# Patient Record
Sex: Female | Born: 1970 | Race: Black or African American | Hispanic: No | Marital: Married
Health system: Midwestern US, Community
[De-identification: ages and names within clinical notes are randomized; demographics above are authoritative.]

## PROBLEM LIST (undated history)

## (undated) DIAGNOSIS — N760 Acute vaginitis: Principal | ICD-10-CM

## (undated) DIAGNOSIS — N62 Hypertrophy of breast: Secondary | ICD-10-CM

## (undated) DIAGNOSIS — N390 Urinary tract infection, site not specified: Secondary | ICD-10-CM

## (undated) DIAGNOSIS — G932 Benign intracranial hypertension: Secondary | ICD-10-CM

## (undated) DIAGNOSIS — B9689 Other specified bacterial agents as the cause of diseases classified elsewhere: Secondary | ICD-10-CM

## (undated) DIAGNOSIS — F32A Depression, unspecified: Secondary | ICD-10-CM

## (undated) DIAGNOSIS — F419 Anxiety disorder, unspecified: Secondary | ICD-10-CM

## (undated) DIAGNOSIS — I10 Essential (primary) hypertension: Secondary | ICD-10-CM

## (undated) DIAGNOSIS — D734 Cyst of spleen: Secondary | ICD-10-CM

## (undated) DIAGNOSIS — H57052 Tonic pupil, left eye: Secondary | ICD-10-CM

## (undated) DIAGNOSIS — M359 Systemic involvement of connective tissue, unspecified: Secondary | ICD-10-CM

## (undated) DIAGNOSIS — F329 Major depressive disorder, single episode, unspecified: Secondary | ICD-10-CM

## (undated) DIAGNOSIS — G43909 Migraine, unspecified, not intractable, without status migrainosus: Secondary | ICD-10-CM

## (undated) DIAGNOSIS — M5412 Radiculopathy, cervical region: Secondary | ICD-10-CM

## (undated) DIAGNOSIS — Z87898 Personal history of other specified conditions: Secondary | ICD-10-CM

## (undated) DIAGNOSIS — M25511 Pain in right shoulder: Secondary | ICD-10-CM

## (undated) DIAGNOSIS — G8929 Other chronic pain: Secondary | ICD-10-CM

## (undated) DIAGNOSIS — Z1231 Encounter for screening mammogram for malignant neoplasm of breast: Secondary | ICD-10-CM

## (undated) DIAGNOSIS — R079 Chest pain, unspecified: Secondary | ICD-10-CM

## (undated) HISTORY — PX: WISDOM TOOTH EXTRACTION: SHX21

## (undated) HISTORY — PX: ENDOMETRIAL ABLATION: SHX621

## (undated) HISTORY — DX: Essential (primary) hypertension: I10

## (undated) HISTORY — DX: Tonic pupil, left eye: H57.052

## (undated) HISTORY — PX: APPENDECTOMY: SHX54

## (undated) HISTORY — PX: TUBAL LIGATION: SHX77

## (undated) HISTORY — PX: CHOLECYSTECTOMY: SHX55

---

## 2009-07-21 ENCOUNTER — Emergency Department (HOSPITAL_COMMUNITY): Admission: EM | Admit: 2009-07-21 | Discharge: 2009-07-21 | Payer: Self-pay | Admitting: Emergency Medicine

## 2010-05-26 LAB — COMPREHENSIVE METABOLIC PANEL
Albumin: 3.2 g/dL — ABNORMAL LOW (ref 3.5–5.2)
BUN: 16 mg/dL (ref 6–23)
CO2: 28 mEq/L (ref 19–32)
Calcium: 8.6 mg/dL (ref 8.4–10.5)
GFR calc non Af Amer: 58 mL/min — ABNORMAL LOW (ref >60)
Glucose, Bld: 98 mg/dL (ref 70–99)
Total Protein: 6.9 g/dL (ref 6.0–8.3)

## 2010-05-26 LAB — DIFFERENTIAL
Basophils Absolute: 0 10*3/uL (ref 0.0–0.1)
Eosinophils Absolute: 0 10*3/uL (ref 0.0–0.7)
Lymphocytes Relative: 14 % (ref 12–46)
Monocytes Absolute: 1.2 10*3/uL — ABNORMAL HIGH (ref 0.1–1.0)
Monocytes Relative: 12 % (ref 3–12)

## 2010-05-26 LAB — CBC
HCT: 38.2 % (ref 36.0–46.0)
MCV: 91 fL (ref 78.0–100.0)
Platelets: 307 10*3/uL (ref 150–400)
RBC: 4.2 MIL/uL (ref 3.87–5.11)
WBC: 10.2 10*3/uL (ref 4.0–10.5)

## 2010-05-26 LAB — URINALYSIS, ROUTINE W REFLEX MICROSCOPIC
Glucose, UA: NEGATIVE mg/dL
Hgb urine dipstick: NEGATIVE
Nitrite: NEGATIVE
Specific Gravity, Urine: 1.014 (ref 1.005–1.030)
pH: 6 (ref 5.0–8.0)

## 2010-06-18 ENCOUNTER — Other Ambulatory Visit (HOSPITAL_COMMUNITY)
Admission: RE | Admit: 2010-06-18 | Discharge: 2010-06-18 | Disposition: A | Discharge status: Home / Self Care | Payer: 59 | Source: Ambulatory Visit | Attending: Internal Medicine | Admitting: Internal Medicine

## 2010-06-18 DIAGNOSIS — Z1159 Encounter for screening for other viral diseases: Secondary | ICD-10-CM | POA: Insufficient documentation

## 2010-06-18 DIAGNOSIS — Z01419 Encounter for gynecological examination (general) (routine) without abnormal findings: Secondary | ICD-10-CM | POA: Insufficient documentation

## 2010-06-20 ENCOUNTER — Other Ambulatory Visit: Payer: Self-pay | Admitting: Internal Medicine

## 2010-06-20 DIAGNOSIS — Z803 Family history of malignant neoplasm of breast: Secondary | ICD-10-CM

## 2010-06-20 DIAGNOSIS — Z1231 Encounter for screening mammogram for malignant neoplasm of breast: Secondary | ICD-10-CM

## 2010-06-26 ENCOUNTER — Ambulatory Visit
Admission: RE | Admit: 2010-06-26 | Discharge: 2010-06-26 | Disposition: A | Discharge status: Home / Self Care | Payer: 59 | Source: Ambulatory Visit | Attending: Internal Medicine | Admitting: Internal Medicine

## 2010-06-26 DIAGNOSIS — Z1231 Encounter for screening mammogram for malignant neoplasm of breast: Secondary | ICD-10-CM

## 2010-06-26 DIAGNOSIS — Z803 Family history of malignant neoplasm of breast: Secondary | ICD-10-CM

## 2010-08-13 ENCOUNTER — Inpatient Hospital Stay (INDEPENDENT_AMBULATORY_CARE_PROVIDER_SITE_OTHER)
Admission: RE | Admit: 2010-08-13 | Discharge: 2010-08-13 | Disposition: A | Discharge status: Home / Self Care | Payer: 59 | Source: Ambulatory Visit | Attending: Family Medicine | Admitting: Family Medicine

## 2010-08-13 DIAGNOSIS — J029 Acute pharyngitis, unspecified: Secondary | ICD-10-CM

## 2010-08-13 DIAGNOSIS — H109 Unspecified conjunctivitis: Secondary | ICD-10-CM

## 2010-12-01 ENCOUNTER — Inpatient Hospital Stay (INDEPENDENT_AMBULATORY_CARE_PROVIDER_SITE_OTHER)
Admission: RE | Admit: 2010-12-01 | Discharge: 2010-12-01 | Disposition: A | Discharge status: Home / Self Care | Payer: 59 | Source: Ambulatory Visit | Attending: Family Medicine | Admitting: Family Medicine

## 2010-12-01 DIAGNOSIS — A499 Bacterial infection, unspecified: Secondary | ICD-10-CM

## 2010-12-01 DIAGNOSIS — B9689 Other specified bacterial agents as the cause of diseases classified elsewhere: Secondary | ICD-10-CM

## 2010-12-01 DIAGNOSIS — N760 Acute vaginitis: Secondary | ICD-10-CM

## 2010-12-01 LAB — POCT URINALYSIS DIP (DEVICE)
Bilirubin Urine: NEGATIVE
Ketones, ur: NEGATIVE mg/dL
Nitrite: NEGATIVE
Specific Gravity, Urine: 1.02 (ref 1.005–1.030)
Urobilinogen, UA: 0.2 mg/dL (ref 0.0–1.0)
pH: 5.5 (ref 5.0–8.0)

## 2010-12-01 LAB — WET PREP, GENITAL
Trich, Wet Prep: NONE SEEN
Yeast Wet Prep HPF POC: NONE SEEN

## 2011-01-10 ENCOUNTER — Inpatient Hospital Stay (INDEPENDENT_AMBULATORY_CARE_PROVIDER_SITE_OTHER)
Admission: RE | Admit: 2011-01-10 | Discharge: 2011-01-10 | Disposition: A | Discharge status: Home / Self Care | Payer: 59 | Source: Ambulatory Visit | Attending: Emergency Medicine | Admitting: Emergency Medicine

## 2011-01-10 DIAGNOSIS — H209 Unspecified iridocyclitis: Secondary | ICD-10-CM

## 2011-01-11 NOTE — Consult Note (Signed)
NAMEJAMES, LAFALCE              ACCOUNT NO.:  [id number]  MEDICAL RECORD NO.:  [id number]  LOCATION:  URG                          FACILITY:  [facility]  PHYSICIAN:  Chalmers Guest, M.D.     DATE OF BIRTH:  06-24-1970  DATE OF CONSULTATION:  01/10/2011 DATE OF DISCHARGE:  01/10/2011                                CONSULTATION   REASON FOR CONSULT:  I was requested to see the patient by [facility] physician, and the patient had presented to the urgent care complaining of severe pain, right eye.  The patient has a history of contact lens wear.  Stated she slept on her contact last night and also the urgent Care doctor noted decreased vision.  The patient's vision was tested in the right eye without contact lenses and without glasses. Vision in the right eye was count fingers at 6 feet.  The left eye revealed 20/30 with the contact lens in place.  The patient's pupils were normal reaction.  Motility was full.  On slit-lamp examination, there are slight staining on the cornea.  The conjunctiva had 2+ injection in the right eye.  The anterior chamber revealed trace to 1+ flare trace cell, lens clear, iris brown.  Funduscopic exam revealed the retina intact.  Optic nerve margins were sharp.  There was about 10% cupping in both eyes.  The left eye slit-lamp examination was completely normal.  ASSESSMENT: 1. Iritis. 2. Contact lens overwear.  RECOMMENDATION:  The patient will be started on TobraDex eyedrops 4 times a day to the right eye and atropine 1% twice a day.  She should follow up at my office in approximately 1 week.     Chalmers Guest, M.D.     RW/MEDQ  D:  01/10/2011  T:  01/11/2011  Job:  [id number]

## 2011-01-18 ENCOUNTER — Emergency Department (HOSPITAL_COMMUNITY)
Admission: EM | Admit: 2011-01-18 | Discharge: 2011-01-18 | Disposition: A | Discharge status: Home / Self Care | Payer: Self-pay | Source: Home / Self Care | Attending: Family Medicine | Admitting: Family Medicine

## 2011-01-18 ENCOUNTER — Encounter: Payer: Self-pay | Admitting: *Deleted

## 2011-01-18 DIAGNOSIS — L259 Unspecified contact dermatitis, unspecified cause: Secondary | ICD-10-CM

## 2011-01-18 DIAGNOSIS — B9689 Other specified bacterial agents as the cause of diseases classified elsewhere: Secondary | ICD-10-CM

## 2011-01-18 DIAGNOSIS — N760 Acute vaginitis: Secondary | ICD-10-CM

## 2011-01-18 DIAGNOSIS — A499 Bacterial infection, unspecified: Secondary | ICD-10-CM

## 2011-01-18 DIAGNOSIS — L509 Urticaria, unspecified: Secondary | ICD-10-CM

## 2011-01-18 DIAGNOSIS — L239 Allergic contact dermatitis, unspecified cause: Secondary | ICD-10-CM

## 2011-01-18 HISTORY — DX: Systemic involvement of connective tissue, unspecified: M35.9

## 2011-01-18 HISTORY — DX: Acute vaginitis: N76.0

## 2011-01-18 HISTORY — DX: Other specified bacterial agents as the cause of diseases classified elsewhere: B96.89

## 2011-01-18 LAB — POCT URINALYSIS DIP (DEVICE)
Ketones, ur: NEGATIVE mg/dL
Leukocytes, UA: NEGATIVE

## 2011-01-18 LAB — WET PREP, GENITAL: Yeast Wet Prep HPF POC: NONE SEEN

## 2011-01-18 MED ORDER — PREDNISONE (PAK) 10 MG PO TABS: ORAL_TABLET | ORAL | Status: AC

## 2011-01-18 MED ORDER — METRONIDAZOLE 500 MG PO TABS: 500.0000 mg | ORAL_TABLET | Freq: Two times a day (BID) | ORAL | Status: AC

## 2011-01-18 MED ORDER — FLUCONAZOLE 150 MG PO TABS: 150.0000 mg | ORAL_TABLET | Freq: Once | ORAL | Status: AC

## 2011-01-18 NOTE — ED Provider Notes (Signed)
History     CSN: [id number] Arrival date & time: 01/18/2011  2:44 PM   First MD Initiated Contact with Patient 01/18/11 1430      Chief Complaint  Patient presents with  . Rash    per pt allergic to menstrual pads wearing one due to vaginal discharge now with rash groin and thighs x 2 days - today rash arms /back - pt also with vaginal discharge   . Vaginal Discharge  . Allergic Reaction    (Consider location/radiation/quality/duration/timing/severity/associated sxs/prior treatment) HPI Comments: Margaret Fletcher presents for evaluation of itchy rash over her groin, axillae, forearms, and trunk. She thinks she might be allergic to menstrual pads or tampons she has been using. She reports a hx of hypersensitivity to various things. She reports autoimmune disease as well as alopecia.   Patient is a 40 y.o. female presenting with rash, vaginal discharge, and allergic reaction. The history is provided by the patient.  Rash  This is a new problem. The current episode started more than 2 days ago. The problem has not changed since onset.The problem is associated with an unknown factor. There has been no fever. The rash is present on the left arm, right arm, groin and abdomen. She has tried nothing for the symptoms. The treatment provided no relief.  Vaginal Discharge This is a new problem. The current episode started more than 2 days ago. The problem has not changed since onset.The symptoms are aggravated by nothing. The symptoms are relieved by nothing.  Allergic Reaction The primary symptoms are  rash and urticaria. The current episode started more than 2 days ago.    Past Medical History  Diagnosis Date  . Alopecia   . Autoimmune disease   . Bacterial vaginosis     Past Surgical History  Procedure Date  . Cesarean section     History reviewed. No pertinent family history.  History  Substance Use Topics  . Smoking status: Never Smoker   . Smokeless tobacco: Not on file  . Alcohol  Use: No    OB History    Grav Para Term Preterm Abortions TAB SAB Ect Mult Living                  Review of Systems  Constitutional: Negative.   HENT: Negative.   Eyes: Negative.   Respiratory: Negative.   Gastrointestinal: Negative.   Genitourinary: Positive for vaginal discharge. Negative for dysuria and pelvic pain.  Skin: Positive for rash.    Allergies  Sulfa antibiotics  Home Medications  No current outpatient prescriptions on file.  BP 136/88  Pulse 71  Temp(Src) 97.3 F (36.3 C) (Oral)  Resp 17  SpO2 99%  LMP 01/13/2011  Physical Exam  Constitutional: She appears well-developed and well-nourished.  HENT:  Head: Normocephalic and atraumatic.  Eyes: EOM are normal.  Genitourinary: Pelvic exam was performed with patient supine. There is no rash on the right labia. There is no rash on the left labia. Cervix exhibits no discharge. Vaginal discharge found.  Skin: Skin is warm and dry. Rash noted. Rash is macular and urticarial.       ED Course  Procedures (including critical care time)   Labs Reviewed  POCT URINALYSIS DIP (DEVICE)  POCT PREGNANCY, URINE  POCT URINALYSIS DIPSTICK  POCT PREGNANCY, URINE   No results found.   No diagnosis found.    MDM          Richardo Priest, MD 01/18/11 [phone]

## 2011-01-19 LAB — GC/CHLAMYDIA PROBE AMP, GENITAL: GC Probe Amp, Genital: NEGATIVE

## 2011-01-19 NOTE — ED Notes (Signed)
Labs and chart reviewed. Pt. adequately treated with Flagyl for bacterial vaginosis ( per protocol orders).   Marland Kitchen

## 2011-04-22 DIAGNOSIS — N762 Acute vulvitis: Secondary | ICD-10-CM | POA: Insufficient documentation

## 2011-06-26 ENCOUNTER — Emergency Department (HOSPITAL_COMMUNITY): Payer: 59

## 2011-06-26 ENCOUNTER — Emergency Department (HOSPITAL_COMMUNITY)
Admission: EM | Admit: 2011-06-26 | Discharge: 2011-06-26 | Disposition: A | Discharge status: Home / Self Care | Payer: 59 | Attending: Emergency Medicine | Admitting: Emergency Medicine

## 2011-06-26 ENCOUNTER — Encounter (HOSPITAL_COMMUNITY): Payer: Self-pay | Admitting: General Practice

## 2011-06-26 ENCOUNTER — Emergency Department (HOSPITAL_COMMUNITY)
Admission: EM | Admit: 2011-06-26 | Discharge: 2011-06-26 | Disposition: A | Discharge status: Home / Self Care | Payer: 59 | Source: Home / Self Care | Attending: Emergency Medicine | Admitting: Emergency Medicine

## 2011-06-26 ENCOUNTER — Encounter (HOSPITAL_COMMUNITY): Payer: Self-pay | Admitting: Emergency Medicine

## 2011-06-26 DIAGNOSIS — R51 Headache: Secondary | ICD-10-CM

## 2011-06-26 DIAGNOSIS — R209 Unspecified disturbances of skin sensation: Secondary | ICD-10-CM

## 2011-06-26 DIAGNOSIS — R202 Paresthesia of skin: Secondary | ICD-10-CM

## 2011-06-26 DIAGNOSIS — G43909 Migraine, unspecified, not intractable, without status migrainosus: Secondary | ICD-10-CM | POA: Insufficient documentation

## 2011-06-26 HISTORY — DX: Migraine, unspecified, not intractable, without status migrainosus: G43.909

## 2011-06-26 LAB — DIFFERENTIAL
Basophils Absolute: 0.1 10*3/uL (ref 0.0–0.1)
Eosinophils Absolute: 0.4 10*3/uL (ref 0.0–0.7)
Lymphs Abs: 2.4 10*3/uL (ref 0.7–4.0)
Neutrophils Relative %: 67 % (ref 43–77)

## 2011-06-26 LAB — URINALYSIS, ROUTINE W REFLEX MICROSCOPIC
Leukocytes, UA: NEGATIVE
Protein, ur: NEGATIVE mg/dL
Urobilinogen, UA: 0.2 mg/dL (ref 0.0–1.0)

## 2011-06-26 LAB — CBC
MCH: 28.7 pg (ref 26.0–34.0)
Platelets: 373 10*3/uL (ref 150–400)
RBC: 4.49 MIL/uL (ref 3.87–5.11)
RDW: 14.2 % (ref 11.5–15.5)
WBC: 10.7 10*3/uL — ABNORMAL HIGH (ref 4.0–10.5)

## 2011-06-26 LAB — BASIC METABOLIC PANEL
Calcium: 8.9 mg/dL (ref 8.4–10.5)
Creatinine, Ser: 1.14 mg/dL — ABNORMAL HIGH (ref 0.50–1.10)
GFR calc non Af Amer: 59 mL/min — ABNORMAL LOW (ref >90)
Glucose, Bld: 89 mg/dL (ref 70–99)
Sodium: 137 mEq/L (ref 135–145)

## 2011-06-26 LAB — POCT PREGNANCY, URINE: Preg Test, Ur: NEGATIVE

## 2011-06-26 MED ORDER — DIPHENHYDRAMINE HCL 50 MG/ML IJ SOLN
50.0000 mg | Freq: Once | INTRAMUSCULAR | Status: AC
  Administered 2011-06-26: 50 mg via INTRAVENOUS
  Filled 2011-06-26: qty 1

## 2011-06-26 MED ORDER — SODIUM CHLORIDE 0.9 % IV SOLN
INTRAVENOUS | Status: DC
  Administered 2011-06-26: 14:00:00 via INTRAVENOUS

## 2011-06-26 MED ORDER — METOCLOPRAMIDE HCL 5 MG/ML IJ SOLN
10.0000 mg | Freq: Once | INTRAMUSCULAR | Status: AC
  Administered 2011-06-26: 10 mg via INTRAVENOUS
  Filled 2011-06-26: qty 2

## 2011-06-26 NOTE — ED Notes (Signed)
PT HERE WITH C/O LEFT SIDE MIGRAINE H/A WITH LEFT ARM ACHY PAIN THAT STARTED @ 4AM.PT STATES SHE WAS SENT HOME LAST NIGHT FOR H.A AND STATES SHE WOKE UP WITH BILAT NUMBNESS IN THIGHS BUT HAS SUBSIDED.PT ALSO REPORTS OF METALLIC TASTE IN MOUTH Tuesday.DENIES N/V/ OR CP AT THIS TIME.PT WORRIED ABOUT SUDDEN SX.

## 2011-06-26 NOTE — ED Provider Notes (Deleted)
41yo F, c/o frequent episodes of alternating left and right frontal migraine headaches this past week. Also c/o "legs went numb" last night, as well as "heavy" left arm today when woke up.  Has long hx of migraines, but usually no assoc paresthesias.  Seen at [facility], sent to ED for further eval.   I saw this patient for screening purposes.  Pt is stable, but further workup and evaluation is needed beyond triage capabilities.   Laray Anger, DO 06/26/11 1351

## 2011-06-26 NOTE — Discharge Instructions (Signed)
RESOURCE GUIDE  Dental Problems  Patients with Medicaid: [facility]                     [facility] [address]                                           [address] Phone:  [phone]                                                  Phone:  [phone]  If unable to pay or uninsured, contact:  [facility] or [facility] to become qualified for the adult dental clinic.  Chronic Pain Problems Contact [facility]  [phone] Patients need to be referred by their primary care doctor.  Insufficient Money for Medicine Contact [employer]:  call "211" or [facility] 271-5999.  No Primary Care Doctor Call [facility]  832-8000 Other agencies that provide inexpensive medical care    [facility]  [phone]    [facility]  [phone]    [facility]  [phone]    Women's Clinic  [phone]    [facility]  [phone]    [facility]  [phone]  Psychological Services [facility]  [phone] [facility]  [phone] [facility]   [phone] (emergency services 641-4993)  Substance Abuse Resources [facility]  336-882-2125 [facility] 336-784-9470 [facility] 336-285-9073 [facility] 336-845-3988 Residential & Outpatient Substance Abuse Program  800-659-3381  Abuse/Neglect [place] Child Abuse Hotline (336) 641-3795 [place] Child Abuse Hotline 800-378-5315 (After Hours)  Emergency Shelter [facility] (336) 271-5985  Maternity Homes [facility] (336) 275-9566 [facility] (704) 372-4663  MRSA Hotline #:   832-7006    [place] [facility]  [facility] [place]     [employer]                          [facility] [address] [place]                       [address]      [address]  [place]                                                [place]                            [place] Phone:  [phone]                                   Phone:  [phone]                 Phone:  [phone]  [facility] Phone:  [phone]    [facility] Child Abuse Hotline 551-757-0256 267-237-6398 (After Hours)   Take your usual prescriptions as previously directed.  Call your regular medical doctor and the Neurologist on Monday to schedule a follow up appointment within the next week.  Return to the Emergency Department immediately sooner if worsening.

## 2011-06-26 NOTE — ED Provider Notes (Signed)
History     CSN: [id number]  Arrival date & time 06/26/11  1102   First MD Initiated Contact with Patient 06/26/11 1124      Chief Complaint  Patient presents with  . Migraine  . Arm Pain    (Consider location/radiation/quality/duration/timing/severity/associated sxs/prior treatment) HPI Comments: Patient started her shift last night around 7 PM and around 8 PM started developing a left frontal headache that she perceived to be a migraine. She had expressed a similar episode during the course of the week which had resolved with the use of Motrin. This particular event her headache this started around 8 PM was more severe towards the left side of her head and after taking several tablets of Motrin. Pains seem not to improve and she was sent home to rest. She woke up around 4 AM in the morning feeling that both of her thighs felt numb and " not normal feeling". She doesn't seem to be expressing a metallic taste in her mouth and as of 8 AM this morning started noticing the same abnormal sensation in her left upper arm but almost like a toothache.  Patient denies any fevers, visual changes, muscular weakness, or ambulation difficulties or disequilibrium.  Patient is a 41 y.o. female presenting with migraine and arm pain. The history is provided by the patient.  Migraine This is a new problem. The current episode started 12 to 24 hours ago. The problem occurs constantly. The problem has been gradually improving. Associated symptoms include headaches. Pertinent negatives include no chest pain, no abdominal pain and no shortness of breath. The symptoms are relieved by nothing. She has tried nothing for the symptoms.  Arm Pain Associated symptoms include headaches. Pertinent negatives include no chest pain, no abdominal pain and no shortness of breath.    Past Medical History  Diagnosis Date  . Alopecia   . Autoimmune disease   . Bacterial vaginosis     Past Surgical History  Procedure  Date  . Cesarean section     History reviewed. No pertinent family history.  History  Substance Use Topics  . Smoking status: Never Smoker   . Smokeless tobacco: Not on file  . Alcohol Use: No    OB History    Grav Para Term Preterm Abortions TAB SAB Ect Mult Living                  Review of Systems  Constitutional: Negative for activity change and appetite change.  Eyes: Negative for visual disturbance.  Respiratory: Negative for cough and shortness of breath.   Cardiovascular: Negative for chest pain.  Gastrointestinal: Negative for abdominal pain.  Musculoskeletal: Negative for back pain.  Neurological: Positive for dizziness, numbness and headaches. Negative for tremors, seizures, syncope, facial asymmetry and weakness.    Allergies  Sulfa antibiotics  Home Medications   Current Outpatient Rx  Name Route Sig Dispense Refill  . SUMATRIPTAN SUCCINATE 25 MG PO TABS Oral Take 25 mg by mouth every 2 (two) hours as needed.      BP 136/91  Pulse 68  Temp(Src) 97.5 F (36.4 C) (Oral)  Resp 14  SpO2 100%  LMP 05/25/2011  Physical Exam  Nursing note and vitals reviewed. Constitutional: She appears well-developed and well-nourished.  HENT:  Mouth/Throat: No oropharyngeal exudate.  Eyes: Right eye exhibits no discharge.  Neck: Neck supple.  Cardiovascular: Normal rate.   Musculoskeletal: Normal range of motion.  Neurological: She is alert. No cranial nerve deficit or sensory deficit. She  exhibits normal muscle tone. Coordination normal.       Visual confrontational testing was normal. Was unable to recreate any abnormal sensorial deficits. Muscle strength 5 out of 5 in both upper and lower extremities and normal hand grip  Skin: No rash noted.    ED Course  Procedures (including critical care time)  Labs Reviewed - No data to display No results found.   1. Headache   2. Paresthesias       MDM  Patient presents urgent care with constellation of  neurological symptoms that include an associated left sided headache with associated paresthesias to both lower extremities and left upper arm. Patient does have a history of migraine headaches although infrequent. (Patient has never experienced a headache with associated paresthesias in the past). Patient muscular sensorial information seem to be within normal when examined at [facility] around. Given neurological symptoms consider patient a candidate for further evaluation although suspicion for an acute cerebrovascular accident is low still needs to be in the differential diagnosis.        Jimmie Molly, MD 06/26/11 1243

## 2011-06-26 NOTE — ED Notes (Signed)
Pt states she began having a headache last night with heaviness in her legs. Upon awaking today she noted her headache pain was reduced but she was having numbness and tingling in her left arm and leg symptoms had subsided. She denies any unilateral weakness or visual disturbances. She went to urgent care today for eval and was sent to the ED.

## 2011-06-26 NOTE — ED Notes (Addendum)
Pt c/o dull headache and achy feeling to left arm. Equal grips and sensation, no pronator drift. Denies numbness or tingling. A&o x4. NAD noted with pt. Pt states that last night both legs felt numb but this subsided when she awoke this AM.

## 2011-06-26 NOTE — ED Provider Notes (Signed)
History     CSN: [id number]  Arrival date & time 06/26/11  1300   None     Chief Complaint  Patient presents with  . Migraine  . Numbness    left arm     HPI Pt was seen at 1345.   Per pt, c/o gradual onset and persistence of multiple intermittent episodes of acute flair of her chronic migraine headache for the past week. Describes the headache as per her usual chronic migraine headache pain pattern for many years.  Pt states the headache is located either on her left or right frontal areas.  Has been assoc with "both my entire thighs went numb" last night, as well as a "heavy feeling" in her left arm today that was present when she woke up this morning.  Pt endorses very long hx of migraine headaches, but usually does not have associated paresthesias.  Denies headache was sudden or maximal in onset or at any time.  Denies visual changes, no focal motor weakness, no fevers, no neck or back pain, no rash, no saddle anesthesia, no incont/retention of bowel or bladder.  Pt was eval at [facility], sent to the ED for further eval.     Past Medical History  Diagnosis Date  . Alopecia   . Autoimmune disease   . Bacterial vaginosis   . Migraine headache     Past Surgical History  Procedure Date  . Cesarean section   . Cholecystectomy   . Appendectomy   . Cesarean section      History  Substance Use Topics  . Smoking status: Never Smoker   . Smokeless tobacco: Not on file  . Alcohol Use: No    Review of Systems ROS: Statement: All systems negative except as marked or noted in the HPI; Constitutional: Negative for fever and chills. ; ; Eyes: Negative for eye pain, redness and discharge. ; ; ENMT: Negative for ear pain, hoarseness, nasal congestion, sinus pressure and sore throat. ; ; Cardiovascular: Negative for chest pain, palpitations, diaphoresis, dyspnea and peripheral edema. ; ; Respiratory: Negative for cough, wheezing and stridor. ; ; Gastrointestinal: Negative for nausea,  vomiting, diarrhea, abdominal pain, blood in stool, hematemesis, jaundice and rectal bleeding. . ; ; Genitourinary: Negative for dysuria, flank pain and hematuria. ; ; Musculoskeletal: Negative for back pain and neck pain. Negative for swelling and trauma.; ; Skin: Negative for pruritus, rash, abrasions, blisters, bruising and skin lesion.; ; Neuro: +headache, paresthesias.  Negative for lightheadedness and neck stiffness. Negative for weakness, altered level of consciousness , altered mental status, extremity weakness, involuntary movement, seizure and syncope.     Allergies  Sulfa antibiotics  Home Medications   Current Outpatient Rx  Name Route Sig Dispense Refill  . ACETAMINOPHEN 500 MG PO TABS Oral Take 1,000-1,500 mg by mouth every 6 (six) hours as needed. For headache pain    . IBUPROFEN 200 MG PO TABS Oral Take 400-1,500 mg by mouth every 6 (six) hours as needed. For headaches. 400mg  for dull pain and 1000mg  for severe pain    . SUMATRIPTAN SUCCINATE 25 MG PO TABS Oral Take 25 mg by mouth every 2 (two) hours as needed. For migraines      BP 137/85  Pulse 63  Temp(Src) 98.4 F (36.9 C) (Oral)  Resp 16  SpO2 98%  LMP 05/25/2011  Physical Exam 1350: Physical examination:  Nursing notes reviewed; Vital signs and O2 SAT reviewed;  Constitutional: Well developed, Well nourished, Well hydrated, In no  acute distress; Head:  Normocephalic, atraumatic; Eyes: EOMI, PERRL, No scleral icterus; ENMT: Mouth and pharynx normal, Mucous membranes moist; Neck: Supple, Full range of motion, No lymphadenopathy; Cardiovascular: Regular rate and rhythm, No murmur, rub, or gallop; Respiratory: Breath sounds clear & equal bilaterally, No rales, rhonchi, wheezes, or rub, Normal respiratory effort/excursion; Chest: Nontender, Movement normal; Abdomen: Soft, Nontender, Nondistended, Normal bowel sounds; Genitourinary: No CVA tenderness; Extremities: Pulses normal, No tenderness, No edema, No calf edema or  asymmetry.; Neuro: AA&Ox3, Major CN grossly intact. Speech clear, no facial droop, gait steady. No gross focal motor or sensory deficits in extremities.; Skin: Color normal, Warm, Dry, no rash.    ED Course  Procedures     MDM  MDM Reviewed: nursing note, vitals and previous chart Interpretation: labs, MRI and CT scan   Results for orders placed during the hospital encounter of 06/26/11  URINALYSIS, ROUTINE W REFLEX MICROSCOPIC      Component Value Range   Color, Urine YELLOW  YELLOW    APPearance CLEAR  CLEAR    Specific Gravity, Urine 1.027  1.005 - 1.030    pH 5.5  5.0 - 8.0    Glucose, UA NEGATIVE  NEGATIVE (mg/dL)   Hgb urine dipstick NEGATIVE  NEGATIVE    Bilirubin Urine NEGATIVE  NEGATIVE    Ketones, ur NEGATIVE  NEGATIVE (mg/dL)   Protein, ur NEGATIVE  NEGATIVE (mg/dL)   Urobilinogen, UA 0.2  0.0 - 1.0 (mg/dL)   Nitrite NEGATIVE  NEGATIVE    Leukocytes, UA NEGATIVE  NEGATIVE   BASIC METABOLIC PANEL      Component Value Range   Sodium 137  135 - 145 (mEq/L)   Potassium 4.3  3.5 - 5.1 (mEq/L)   Chloride 103  96 - 112 (mEq/L)   CO2 26  19 - 32 (mEq/L)   Glucose, Bld 89  70 - 99 (mg/dL)   BUN 16  6 - 23 (mg/dL)   Creatinine, Ser 1.61 (*) 0.50 - 1.10 (mg/dL)   Calcium 8.9  8.4 - 09.6 (mg/dL)   GFR calc non Af Amer 59 (*) >90 (mL/min)   GFR calc Af Amer 68 (*) >90 (mL/min)  CBC      Component Value Range   WBC 10.7 (*) 4.0 - 10.5 (K/uL)   RBC 4.49  3.87 - 5.11 (MIL/uL)   Hemoglobin 12.9  12.0 - 15.0 (g/dL)   HCT 04.5  40.9 - 81.1 (%)   MCV 86.6  78.0 - 100.0 (fL)   MCH 28.7  26.0 - 34.0 (pg)   MCHC 33.2  30.0 - 36.0 (g/dL)   RDW 91.4  78.2 - 95.6 (%)   Platelets 373  150 - 400 (K/uL)  DIFFERENTIAL      Component Value Range   Neutrophils Relative 67  43 - 77 (%)   Neutro Abs 7.2  1.7 - 7.7 (K/uL)   Lymphocytes Relative 22  12 - 46 (%)   Lymphs Abs 2.4  0.7 - 4.0 (K/uL)   Monocytes Relative 7  3 - 12 (%)   Monocytes Absolute 0.7  0.1 - 1.0 (K/uL)    Eosinophils Relative 3  0 - 5 (%)   Eosinophils Absolute 0.4  0.0 - 0.7 (K/uL)   Basophils Relative 1  0 - 1 (%)   Basophils Absolute 0.1  0.0 - 0.1 (K/uL)  POCT PREGNANCY, URINE      Component Value Range   Preg Test, Ur NEGATIVE  NEGATIVE    Ct Head Wo  Contrast 06/26/2011  *RADIOLOGY REPORT*  Clinical Data: Headache.  Leg numbness.  CT HEAD WITHOUT CONTRAST  Technique:  Contiguous axial images were obtained from the base of the skull through the vertex without contrast.  Comparison: None.  Findings: The brain has a normal appearance without evidence of atrophy, old or acute infarction, mass lesion, hemorrhage, hydrocephalus or extra-axial collection.  No calvarial abnormality. Visualized sinuses, middle ears and mastoids are clear.  IMPRESSION: Normal head CT.  No cause of headache identified.  Original Report Authenticated By: Thomasenia Sales, M.D.     6:52 PM:  MRI brain: prelim report with no acute stroke or visible bleed, empty sella.  Pt states she "feels better now" with all of her symptoms improved after meds.  States she wants to go home now.  Pt has had stable VS, neuro exam intact and unchanged, has been walking around the ED with steady gait, easy resps.  Appears to be migraine headache.  Has specific migraine headache meds at home, but states she "doesn't take them" because she "doesn't like the way on feel on them."  Pt also states her PMD wanted to start her on daily suppressive migraine headache meds, but pt states she refused because "they were seizure medicines and I didn't want to get a seizure if I stopped them."  Pt does not have hx of seizure; this explained to pt.  States she will f/u with her PMD this week.  Does not want any rx here for headache/pain, stating she will take OTC motrin and tylenol for headache.  Does not want rx anti-emetic or benadryl.  Dx testing d/w pt.  Questions answered.  Verb understanding, agreeable to d/c home with outpt f/u.             Laray Anger, DO 06/29/11 [phone]

## 2011-06-26 NOTE — ED Notes (Signed)
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

## 2011-10-21 ENCOUNTER — Other Ambulatory Visit: Payer: Self-pay | Admitting: Gastroenterology

## 2011-10-21 DIAGNOSIS — R109 Unspecified abdominal pain: Secondary | ICD-10-CM

## 2011-10-26 ENCOUNTER — Ambulatory Visit
Admission: RE | Admit: 2011-10-26 | Discharge: 2011-10-26 | Disposition: A | Discharge status: Home / Self Care | Payer: 59 | Source: Ambulatory Visit | Attending: Gastroenterology | Admitting: Gastroenterology

## 2011-10-26 DIAGNOSIS — R109 Unspecified abdominal pain: Secondary | ICD-10-CM

## 2011-10-26 MED ORDER — IOHEXOL 300 MG/ML  SOLN
125.0000 mL | Freq: Once | INTRAMUSCULAR | Status: AC | PRN
  Administered 2011-10-26: 125 mL via INTRAVENOUS

## 2011-11-10 ENCOUNTER — Encounter (HOSPITAL_COMMUNITY): Payer: Self-pay

## 2011-11-10 ENCOUNTER — Emergency Department (HOSPITAL_COMMUNITY)
Admission: EM | Admit: 2011-11-10 | Discharge: 2011-11-10 | Disposition: A | Discharge status: Home / Self Care | Payer: 59 | Source: Home / Self Care | Attending: Family Medicine | Admitting: Family Medicine

## 2011-11-10 DIAGNOSIS — A499 Bacterial infection, unspecified: Secondary | ICD-10-CM

## 2011-11-10 DIAGNOSIS — N760 Acute vaginitis: Secondary | ICD-10-CM

## 2011-11-10 DIAGNOSIS — B9689 Other specified bacterial agents as the cause of diseases classified elsewhere: Secondary | ICD-10-CM

## 2011-11-10 LAB — POCT URINALYSIS DIP (DEVICE)
Bilirubin Urine: NEGATIVE
Hgb urine dipstick: NEGATIVE
Ketones, ur: NEGATIVE mg/dL
Nitrite: NEGATIVE
Specific Gravity, Urine: 1.025 (ref 1.005–1.030)
pH: 6 (ref 5.0–8.0)

## 2011-11-10 LAB — WET PREP, GENITAL: Trich, Wet Prep: NONE SEEN

## 2011-11-10 MED ORDER — METRONIDAZOLE 250 MG PO TABS: 250.0000 mg | ORAL_TABLET | Freq: Three times a day (TID) | ORAL | Status: AC

## 2011-11-10 NOTE — ED Provider Notes (Signed)
History     CSN: [id number]  Arrival date & time 11/10/11  1732   First MD Initiated Contact with Patient 11/10/11 1734      Chief Complaint  Patient presents with  . Vaginal Discharge    5 day noted discharge of brownish in color, and has foul odor.  denies any urinary difficulty.  has not tried any otc meds.     (Consider location/radiation/quality/duration/timing/severity/associated sxs/prior treatment) Patient is a 41 y.o. female presenting with vaginal discharge. The history is provided by the patient.  Vaginal Discharge This is a new problem. The current episode started more than 2 days ago. The problem has not changed since onset.Pertinent negatives include no abdominal pain.    Past Medical History  Diagnosis Date  . Alopecia   . Autoimmune disease   . Bacterial vaginosis   . Migraine headache     Past Surgical History  Procedure Date  . Cesarean section   . Cholecystectomy   . Appendectomy   . Cesarean section     History reviewed. No pertinent family history.  History  Substance Use Topics  . Smoking status: Never Smoker   . Smokeless tobacco: Not on file  . Alcohol Use: No    OB History    Grav Para Term Preterm Abortions TAB SAB Ect Mult Living                  Review of Systems  Constitutional: Negative.   Gastrointestinal: Negative.  Negative for abdominal pain.  Genitourinary: Positive for vaginal discharge. Negative for vaginal bleeding and vaginal pain.    Allergies  Sulfa antibiotics  Home Medications   Current Outpatient Rx  Name Route Sig Dispense Refill  . ACETAMINOPHEN 500 MG PO TABS Oral Take 1,000-1,500 mg by mouth every 6 (six) hours as needed. For headache pain    . IBUPROFEN 200 MG PO TABS Oral Take 400-1,500 mg by mouth every 6 (six) hours as needed. For headaches. 400mg  for dull pain and 1000mg  for severe pain    . METRONIDAZOLE 250 MG PO TABS Oral Take 1 tablet (250 mg total) by mouth 3 (three) times daily. 21 tablet 0    . SUMATRIPTAN SUCCINATE 25 MG PO TABS Oral Take 25 mg by mouth every 2 (two) hours as needed. For migraines      BP 127/86  Pulse 85  Temp 99.1 F (37.3 C) (Oral)  Resp 18  SpO2 99%  LMP 11/04/2011  Physical Exam  Nursing note and vitals reviewed. Constitutional: She appears well-developed and well-nourished.  Abdominal: Soft. Bowel sounds are normal.  Genitourinary: Uterus normal. Uterus is not tender. Cervix exhibits no motion tenderness and no discharge. Right adnexum displays no mass and no tenderness. Left adnexum displays no mass and no tenderness. No tenderness or bleeding around the vagina. No foreign body around the vagina. No signs of injury around the vagina. Vaginal discharge found.  Lymphadenopathy:       Right: No inguinal adenopathy present.       Left: No inguinal adenopathy present.    ED Course  Procedures (including critical care time)  Labs Reviewed  POCT URINALYSIS DIP (DEVICE) - Abnormal; Notable for the following:    Leukocytes, UA TRACE (*)  Biochemical Testing Only. Please order routine urinalysis from main lab if confirmatory testing is needed.   All other components within normal limits  GC/CHLAMYDIA PROBE AMP, GENITAL  WET PREP, GENITAL   No results found.   1. Bacterial vaginitis  MDM          Linna Hoff, MD 11/10/11 [phone]

## 2011-11-10 NOTE — ED Notes (Signed)
5 day noted discharge of brownish in color, and has foul odor.  denies any urinary difficulty.  has not tried any otc meds

## 2011-11-11 LAB — GC/CHLAMYDIA PROBE AMP, GENITAL: GC Probe Amp, Genital: NEGATIVE

## 2011-11-12 NOTE — ED Notes (Signed)
GC/Chlamydia neg., Wet prep: Clue cells TNTC, mod. WBC's. Pt. adequately treated with Flagyl. Margaret Fletcher 11/12/2011

## 2012-03-23 ENCOUNTER — Encounter (HOSPITAL_COMMUNITY): Payer: Self-pay

## 2012-03-23 ENCOUNTER — Other Ambulatory Visit (HOSPITAL_COMMUNITY)
Admission: RE | Admit: 2012-03-23 | Discharge: 2012-03-23 | Disposition: A | Discharge status: Home / Self Care | Payer: 59 | Source: Ambulatory Visit | Attending: Emergency Medicine | Admitting: Emergency Medicine

## 2012-03-23 ENCOUNTER — Emergency Department (HOSPITAL_COMMUNITY)
Admission: EM | Admit: 2012-03-23 | Discharge: 2012-03-23 | Disposition: A | Discharge status: Home / Self Care | Payer: 59 | Source: Home / Self Care | Attending: Emergency Medicine | Admitting: Emergency Medicine

## 2012-03-23 DIAGNOSIS — Z113 Encounter for screening for infections with a predominantly sexual mode of transmission: Secondary | ICD-10-CM | POA: Insufficient documentation

## 2012-03-23 DIAGNOSIS — N760 Acute vaginitis: Secondary | ICD-10-CM

## 2012-03-23 LAB — POCT PREGNANCY, URINE: Preg Test, Ur: NEGATIVE

## 2012-03-23 MED ORDER — METRONIDAZOLE 500 MG PO TABS: 500.0000 mg | ORAL_TABLET | Freq: Two times a day (BID) | ORAL | Status: DC

## 2012-03-23 NOTE — ED Provider Notes (Signed)
History     CSN: [id number]  Arrival date & time 03/23/12  1615   First MD Initiated Contact with Patient 03/23/12 1631      No chief complaint on file.   (Consider location/radiation/quality/duration/timing/severity/associated sxs/prior treatment) HPI Comments: Patient presents urgent care describing she is expressing menstrual collapse in that she tends to have bacterial vaginosis prior and after each of her periods. She  describes she has no concerns of having been exposed an SUV and is refusing to have a pelvic exam. She has a followup appointment with her GYN Dr.   Donn Pierini history is provided by the patient.    Past Medical History  Diagnosis Date  . Alopecia   . Autoimmune disease   . Bacterial vaginosis   . Migraine headache     Past Surgical History  Procedure Date  . Cesarean section   . Cholecystectomy   . Appendectomy   . Cesarean section     No family history on file.  History  Substance Use Topics  . Smoking status: Never Smoker   . Smokeless tobacco: Not on file  . Alcohol Use: No    OB History    Grav Para Term Preterm Abortions TAB SAB Ect Mult Living                  Review of Systems  Constitutional: Negative for fever, chills, diaphoresis, activity change, appetite change and fatigue.  Respiratory: Negative for shortness of breath.   Gastrointestinal: Negative for nausea and diarrhea.  Genitourinary: Positive for vaginal discharge. Negative for urgency, frequency, hematuria, flank pain, genital sores, menstrual problem and pelvic pain.  Skin: Negative for color change, rash and wound.    Allergies  Sulfa antibiotics  Home Medications   Current Outpatient Rx  Name  Route  Sig  Dispense  Refill  . ACETAMINOPHEN 500 MG PO TABS   Oral   Take 1,000-1,500 mg by mouth every 6 (six) hours as needed. For headache pain         . IBUPROFEN 200 MG PO TABS   Oral   Take 400-1,500 mg by mouth every 6 (six) hours as needed. For headaches. 400mg   for dull pain and 1000mg  for severe pain         . METRONIDAZOLE 500 MG PO TABS   Oral   Take 1 tablet (500 mg total) by mouth 2 (two) times daily.   14 tablet   0   . SUMATRIPTAN SUCCINATE 25 MG PO TABS   Oral   Take 25 mg by mouth every 2 (two) hours as needed. For migraines           BP 123/88  Pulse 64  Temp 98.4 F (36.9 C) (Oral)  Resp 18  SpO2 100%  Physical Exam  Constitutional: Vital signs are normal. She appears well-developed and well-nourished.  Non-toxic appearance. She does not have a sickly appearance. She does not appear ill. No distress.  HENT:  Head: Normocephalic.  Neck: Neck supple.  Neurological: She is alert.  Skin: No rash noted. No erythema.    ED Course  Procedures (including critical care time)   Labs Reviewed  URINE CYTOLOGY ANCILLARY ONLY   No results found.   1. Vaginitis       MDM  Vaginal discharge with menstrual pain. Patient to continue taking ibuprofen, to take Flagyl empirically we have obtained urine sample which were ruling out other potential sources .   Jimmie Molly, MD 03/23/12 1739

## 2012-03-23 NOTE — ED Notes (Signed)
Reportedly has BV w each menses, and has again

## 2012-04-17 ENCOUNTER — Encounter (HOSPITAL_COMMUNITY): Payer: Self-pay | Admitting: *Deleted

## 2012-04-17 ENCOUNTER — Emergency Department (HOSPITAL_COMMUNITY)
Admission: EM | Admit: 2012-04-17 | Discharge: 2012-04-17 | Disposition: A | Discharge status: Home / Self Care | Payer: 59 | Source: Home / Self Care

## 2012-04-17 DIAGNOSIS — L0390 Cellulitis, unspecified: Secondary | ICD-10-CM

## 2012-04-17 DIAGNOSIS — L0291 Cutaneous abscess, unspecified: Secondary | ICD-10-CM

## 2012-04-17 MED ORDER — DOXYCYCLINE HYCLATE 50 MG PO CAPS: 50.0000 mg | ORAL_CAPSULE | Freq: Two times a day (BID) | ORAL | Status: DC

## 2012-04-17 MED ORDER — ONDANSETRON HCL 4 MG PO TABS: 4.0000 mg | ORAL_TABLET | Freq: Three times a day (TID) | ORAL | Status: DC | PRN

## 2012-04-17 MED ORDER — FLUCONAZOLE 100 MG PO TABS: 100.0000 mg | ORAL_TABLET | Freq: Every day | ORAL | Status: DC

## 2012-04-17 NOTE — ED Provider Notes (Signed)
History     CSN: [id number]  Arrival date & time 04/17/12  1141   First MD Initiated Contact with Patient 04/17/12 1143      Chief Complaint  Patient presents with  . Recurrent Skin Infections    (Consider location/radiation/quality/duration/timing/severity/associated sxs/prior treatment) HPI  Past Medical History  Diagnosis Date  . Alopecia   . Autoimmune disease   . Bacterial vaginosis   . Migraine headache     Past Surgical History  Procedure Laterality Date  . Cesarean section    . Cholecystectomy    . Appendectomy    . Cesarean section      No family history on file.  History  Substance Use Topics  . Smoking status: Never Smoker   . Smokeless tobacco: Not on file  . Alcohol Use: No    OB History   Grav Para Term Preterm Abortions TAB SAB Ect Mult Living                  Review of Systems  Allergies  Sulfa antibiotics  Home Medications   Current Outpatient Rx  Name  Route  Sig  Dispense  Refill  . acetaminophen (TYLENOL) 500 MG tablet   Oral   Take 1,000-1,500 mg by mouth every 6 (six) hours as needed. For headache pain         . ibuprofen (ADVIL,MOTRIN) 200 MG tablet   Oral   Take 400-1,500 mg by mouth every 6 (six) hours as needed. For headaches. 400mg  for dull pain and 1000mg  for severe pain         . metroNIDAZOLE (FLAGYL) 500 MG tablet   Oral   Take 1 tablet (500 mg total) by mouth 2 (two) times daily.   14 tablet   0   . SUMAtriptan (IMITREX) 25 MG tablet   Oral   Take 25 mg by mouth every 2 (two) hours as needed. For migraines           Pulse 66  Temp(Src) 98.3 F (36.8 C) (Oral)  Resp 17  SpO2 100%  LMP 03/28/2012  Physical Exam  ED Course  Procedures Incision and Drainage Procedure Note  Pre-operative Diagnosis: Abscess  Post-operative Diagnosis: same  Indications: Pain, swelling, induration  Anesthesia: 1% lidocaine with epinephrine  Procedure Details  The procedure, risks and complications have been  discussed in detail (including, but not limited to airway compromise, infection, bleeding) with the patient, and the patient has signed consent to the procedure.  The skin was sterilely prepped and draped over the affected area in the usual fashion. After adequate local anesthesia, I&D with a #11 blade was performed on the right, super pubic area Purulent drainage: absent The patient was observed until stable.  Findings: Indurated abscess   EBL: 0.5 cc's  Drains: None  Condition: Tolerated procedure well   Complications: none.     Labs Reviewed - No data to display No results found.   No diagnosis found.    MDM  Abscess, drained        Rodolph Bong, MD 04/20/12 [phone]

## 2012-04-17 NOTE — ED Notes (Signed)
Patient states she had a boil on her abdomen x 3 days. Right inguinal area of abdomen with fever and chills.. Patient denies nausea, vomiting.

## 2012-04-17 NOTE — ED Provider Notes (Signed)
History     CSN: [id number]  Arrival date & time 04/17/12  1141   First MD Initiated Contact with Patient 04/17/12 1143      Chief Complaint  Patient presents with  . Recurrent Skin Infections    (Consider location/radiation/quality/duration/timing/severity/associated sxs/prior treatment) HPI This is a 42 year old female who presents with a "boil" near her groin for the past 3 days. She tried to squeeze it but nothing has come out and it has grown. She has not had any fevers or chills. She had a similar boil on her abdomen a while back (she cannot remember) and was able to express it bleeding it to resolve without any medical treatment.  She also states that when she receives antibiotics she develops a vaginal yeast infections and is asking for Diflucan. Past Medical History  Diagnosis Date  . Alopecia   . Autoimmune disease   . Bacterial vaginosis   . Migraine headache     Past Surgical History  Procedure Laterality Date  . Cesarean section    . Cholecystectomy    . Appendectomy    . Cesarean section      No family history on file.  History  Substance Use Topics  . Smoking status: Never Smoker   . Smokeless tobacco: Not on file  . Alcohol Use: No    OB History   Grav Para Term Preterm Abortions TAB SAB Ect Mult Living                  Review of Systems  Constitutional: Negative for chills, diaphoresis and fatigue.  HENT: Negative.   Eyes: Negative.   Respiratory: Negative.   Cardiovascular: Negative.   Gastrointestinal: Negative.   Genitourinary: Negative.   Skin: Positive for rash.  Neurological: Negative.   Psychiatric/Behavioral: Negative.     Allergies  Sulfa antibiotics  Home Medications   Current Outpatient Rx  Name  Route  Sig  Dispense  Refill  . acetaminophen (TYLENOL) 500 MG tablet   Oral   Take 1,000-1,500 mg by mouth every 6 (six) hours as needed. For headache pain         . ibuprofen (ADVIL,MOTRIN) 200 MG tablet   Oral   Take  400-1,500 mg by mouth every 6 (six) hours as needed. For headaches. 400mg  for dull pain and 1000mg  for severe pain         . metroNIDAZOLE (FLAGYL) 500 MG tablet   Oral   Take 1 tablet (500 mg total) by mouth 2 (two) times daily.   14 tablet   0   . SUMAtriptan (IMITREX) 25 MG tablet   Oral   Take 25 mg by mouth every 2 (two) hours as needed. For migraines           Pulse 66  Temp(Src) 98.3 F (36.8 C) (Oral)  Resp 17  SpO2 100%  LMP 03/28/2012  Physical Exam  Constitutional: She is oriented to person, place, and time. She appears well-developed and well-nourished.  HENT:  Head: Normocephalic and atraumatic.  Cardiovascular: Normal rate and regular rhythm.   Pulmonary/Chest: Effort normal and breath sounds normal.  Abdominal: Soft. Bowel sounds are normal.  Neurological: She is alert and oriented to person, place, and time.  Skin:  And lower abdomen there is a area which is 2 inches in diameter, erythematous, indurated and tender.    ED Course  Procedures (including critical care time)  Labs Reviewed - No data to display No results found.   Cellulitis  MDM  I&D performed by Dr. Denyse Amass. Doxycycline prescribed along with Diflucan if the vaginal candidiasis occurs and Zofran for nausea. She is strongly advised that if fevers develop or if the rash worsens over the next 24-48 hours she needs to go to the ER.      Calvert Cantor, MD 04/17/12 [phone]

## 2012-04-17 NOTE — ED Notes (Signed)
Placed sterile 2x2 dressing on drainage site with paper tape. Instructed patient to keep dressing on today and tomorrow and change tomorrow morning.

## 2012-04-28 ENCOUNTER — Other Ambulatory Visit: Payer: Self-pay | Admitting: Obstetrics and Gynecology

## 2012-04-28 DIAGNOSIS — N6311 Unspecified lump in the right breast, upper outer quadrant: Secondary | ICD-10-CM

## 2012-05-10 ENCOUNTER — Other Ambulatory Visit: Payer: 59

## 2012-05-16 ENCOUNTER — Other Ambulatory Visit: Payer: 59

## 2012-07-04 ENCOUNTER — Encounter (HOSPITAL_COMMUNITY): Payer: Self-pay | Admitting: Obstetrics and Gynecology

## 2012-07-08 ENCOUNTER — Encounter (HOSPITAL_COMMUNITY): Payer: Self-pay | Admitting: Pharmacist

## 2012-07-19 ENCOUNTER — Ambulatory Visit (HOSPITAL_COMMUNITY): Payer: 59 | Admitting: Anesthesiology

## 2012-07-19 ENCOUNTER — Encounter (HOSPITAL_COMMUNITY)
Admission: RE | Disposition: A | Discharge status: Home / Self Care | Payer: Self-pay | Source: Ambulatory Visit | Attending: Obstetrics and Gynecology

## 2012-07-19 ENCOUNTER — Encounter (HOSPITAL_COMMUNITY): Payer: Self-pay | Admitting: Anesthesiology

## 2012-07-19 ENCOUNTER — Ambulatory Visit (HOSPITAL_COMMUNITY)
Admission: RE | Admit: 2012-07-19 | Discharge: 2012-07-19 | Disposition: A | Discharge status: Home / Self Care | Payer: 59 | Source: Ambulatory Visit | Attending: Obstetrics and Gynecology | Admitting: Obstetrics and Gynecology

## 2012-07-19 DIAGNOSIS — N920 Excessive and frequent menstruation with regular cycle: Secondary | ICD-10-CM | POA: Insufficient documentation

## 2012-07-19 HISTORY — PX: DILITATION & CURRETTAGE/HYSTROSCOPY WITH NOVASURE ABLATION: SHX5568

## 2012-07-19 LAB — CBC
MCH: 27.8 pg (ref 26.0–34.0)
MCV: 85 fL (ref 78.0–100.0)
Platelets: 357 10*3/uL (ref 150–400)
RBC: 4.21 MIL/uL (ref 3.87–5.11)
RDW: 14.9 % (ref 11.5–15.5)

## 2012-07-19 SURGERY — DILATATION & CURETTAGE/HYSTEROSCOPY WITH NOVASURE ABLATION
Anesthesia: General | Site: Vagina | Wound class: Clean Contaminated

## 2012-07-19 MED ORDER — ONDANSETRON HCL 4 MG/2ML IJ SOLN
INTRAMUSCULAR | Status: DC | PRN
  Administered 2012-07-19: 4 mg via INTRAVENOUS

## 2012-07-19 MED ORDER — LIDOCAINE HCL 1 % IJ SOLN
INTRAMUSCULAR | Status: DC | PRN
  Administered 2012-07-19: 10 mL

## 2012-07-19 MED ORDER — FENTANYL CITRATE 0.05 MG/ML IJ SOLN
INTRAMUSCULAR | Status: AC
  Administered 2012-07-19: 50 ug via INTRAVENOUS
  Filled 2012-07-19: qty 2

## 2012-07-19 MED ORDER — ONDANSETRON HCL 4 MG/2ML IJ SOLN
4.0000 mg | Freq: Once | INTRAMUSCULAR | Status: AC | PRN
  Administered 2012-07-19: 4 mg via INTRAVENOUS

## 2012-07-19 MED ORDER — OXYCODONE-ACETAMINOPHEN 5-325 MG PO TABS: 1.0000 | ORAL_TABLET | ORAL | Status: DC | PRN

## 2012-07-19 MED ORDER — ONDANSETRON HCL 4 MG/2ML IJ SOLN
INTRAMUSCULAR | Status: AC
  Filled 2012-07-19: qty 2

## 2012-07-19 MED ORDER — LIDOCAINE HCL (CARDIAC) 20 MG/ML IV SOLN
INTRAVENOUS | Status: DC | PRN
  Administered 2012-07-19: 100 mg via INTRAVENOUS

## 2012-07-19 MED ORDER — FENTANYL CITRATE 0.05 MG/ML IJ SOLN
INTRAMUSCULAR | Status: AC
  Filled 2012-07-19: qty 2

## 2012-07-19 MED ORDER — ALUM & MAG HYDROXIDE-SIMETH 200-200-20 MG/5ML PO SUSP
30.0000 mL | ORAL | Status: AC | PRN
  Administered 2012-07-19: 30 mL via ORAL
  Filled 2012-07-19 (×2): qty 30

## 2012-07-19 MED ORDER — ALUM & MAG HYDROXIDE-SIMETH 200-200-20 MG/5ML PO SUSP
30.0000 mL | ORAL | Status: DC | PRN
  Filled 2012-07-19: qty 30

## 2012-07-19 MED ORDER — DEXAMETHASONE SODIUM PHOSPHATE 10 MG/ML IJ SOLN
INTRAMUSCULAR | Status: AC
  Filled 2012-07-19: qty 1

## 2012-07-19 MED ORDER — PROPOFOL 10 MG/ML IV EMUL
INTRAVENOUS | Status: DC | PRN
  Administered 2012-07-19: 200 mg via INTRAVENOUS

## 2012-07-19 MED ORDER — KETOROLAC TROMETHAMINE 30 MG/ML IJ SOLN: 15.0000 mg | Freq: Once | INTRAMUSCULAR | Status: DC | PRN

## 2012-07-19 MED ORDER — DEXAMETHASONE SODIUM PHOSPHATE 4 MG/ML IJ SOLN
INTRAMUSCULAR | Status: DC | PRN
  Administered 2012-07-19: 10 mg via INTRAVENOUS

## 2012-07-19 MED ORDER — LIDOCAINE HCL (CARDIAC) 20 MG/ML IV SOLN
INTRAVENOUS | Status: AC
  Filled 2012-07-19: qty 5

## 2012-07-19 MED ORDER — MIDAZOLAM HCL 2 MG/2ML IJ SOLN
INTRAMUSCULAR | Status: AC
  Filled 2012-07-19: qty 2

## 2012-07-19 MED ORDER — MIDAZOLAM HCL 5 MG/5ML IJ SOLN
INTRAMUSCULAR | Status: DC | PRN
  Administered 2012-07-19: 2 mg via INTRAVENOUS

## 2012-07-19 MED ORDER — MEPERIDINE HCL 25 MG/ML IJ SOLN
INTRAMUSCULAR | Status: AC
  Administered 2012-07-19: 12.5 mg via INTRAVENOUS
  Filled 2012-07-19: qty 1

## 2012-07-19 MED ORDER — LACTATED RINGERS IV SOLN
INTRAVENOUS | Status: DC
  Administered 2012-07-19 (×2): via INTRAVENOUS

## 2012-07-19 MED ORDER — KETOROLAC TROMETHAMINE 30 MG/ML IJ SOLN
INTRAMUSCULAR | Status: AC
  Filled 2012-07-19: qty 1

## 2012-07-19 MED ORDER — KETOROLAC TROMETHAMINE 30 MG/ML IJ SOLN
INTRAMUSCULAR | Status: DC | PRN
  Administered 2012-07-19: 30 mg via INTRAVENOUS

## 2012-07-19 MED ORDER — MEPERIDINE HCL 25 MG/ML IJ SOLN
6.2500 mg | INTRAMUSCULAR | Status: DC | PRN
  Administered 2012-07-19: 12.5 mg via INTRAVENOUS

## 2012-07-19 MED ORDER — OXYCODONE-ACETAMINOPHEN 5-325 MG PO TABS
1.0000 | ORAL_TABLET | ORAL | Status: DC | PRN
  Administered 2012-07-19: 1 via ORAL

## 2012-07-19 MED ORDER — FENTANYL CITRATE 0.05 MG/ML IJ SOLN
25.0000 ug | INTRAMUSCULAR | Status: DC | PRN
  Administered 2012-07-19 (×4): 50 ug via INTRAVENOUS

## 2012-07-19 MED ORDER — PROPOFOL 10 MG/ML IV EMUL
INTRAVENOUS | Status: AC
  Filled 2012-07-19: qty 20

## 2012-07-19 MED ORDER — FENTANYL CITRATE 0.05 MG/ML IJ SOLN
INTRAMUSCULAR | Status: DC | PRN
  Administered 2012-07-19: 100 ug via INTRAVENOUS

## 2012-07-19 MED ORDER — LACTATED RINGERS IR SOLN
Status: DC | PRN
  Administered 2012-07-19: 3000 mL

## 2012-07-19 MED ORDER — OXYCODONE-ACETAMINOPHEN 5-325 MG PO TABS
ORAL_TABLET | ORAL | Status: AC
  Filled 2012-07-19: qty 1

## 2012-07-19 SURGICAL SUPPLY — 15 items
ABLATOR ENDOMETRIAL BIPOLAR (ABLATOR) ×2 IMPLANT
CANISTER SUCTION 2500CC (MISCELLANEOUS) ×2 IMPLANT
CATH ROBINSON RED A/P 16FR (CATHETERS) ×2 IMPLANT
CLOTH BEACON ORANGE TIMEOUT ST (SAFETY) ×2 IMPLANT
CONTAINER PREFILL 10% NBF 60ML (FORM) ×2 IMPLANT
DILATOR CANAL MILEX (MISCELLANEOUS) IMPLANT
DRESSING TELFA 8X3 (GAUZE/BANDAGES/DRESSINGS) ×2 IMPLANT
ELECT REM PT RETURN 9FT ADLT (ELECTROSURGICAL)
ELECTRODE REM PT RTRN 9FT ADLT (ELECTROSURGICAL) IMPLANT
GLOVE BIO SURGEON STRL SZ7 (GLOVE) ×2 IMPLANT
GOWN STRL REIN XL XLG (GOWN DISPOSABLE) ×6 IMPLANT
PACK HYSTEROSCOPY LF (CUSTOM PROCEDURE TRAY) ×2 IMPLANT
PAD OB MATERNITY 4.3X12.25 (PERSONAL CARE ITEMS) ×2 IMPLANT
TOWEL OR 17X24 6PK STRL BLUE (TOWEL DISPOSABLE) ×4 IMPLANT
WATER STERILE IRR 1000ML POUR (IV SOLUTION) ×2 IMPLANT

## 2012-07-19 NOTE — Transfer of Care (Signed)
Immediate Anesthesia Transfer of Care Note  Patient: Margaret Fletcher  Procedure(s) Performed: Procedure(s): HYSTEROSCOPY WITH NOVASURE ABLATION (N/A)  Patient Location: PACU  Anesthesia Type:General  Level of Consciousness: awake, alert  and oriented  Airway & Oxygen Therapy: Patient Spontanous Breathing and Patient connected to nasal cannula oxygen  Post-op Assessment: Report given to PACU RN and Post -op Vital signs reviewed and stable  Post vital signs: Reviewed and stable  Complications: No apparent anesthesia complications

## 2012-07-19 NOTE — Op Note (Signed)
Pre-Operative Diagnosis: 1) Menorrhagia Postoperative diagnoses: 1) Menorrhagia Procedure: Hysteroscopy, Dilation & Curettage, Endometrial Ablation with Novasure Surgeon: Dr. Waynard Reeds Asst.: None Operative findings: Uterus in mid position.  Uterus sounded to 9.5 cm with cervical length, Cavity Width 4.0, Cavity Length 5.5cm. Power 88. Ablation time 2 minutes Specimen: Endometrial curettings EBL: Minimal Procedure: 42 year old [place] American female presents for surgical evaluation of menorrhagia. She has had heavy menstrual bleeding for several years. She declines medical intervention and wishes to proceed with surgical management with endometrial ablation procedure. The risks, benefits, and alternatives of the procedure were discussed at length with the patient and she wishes to proceed. Following the appropriate informed consent the patient was taken to the operating room and placed in the dorsal lithotomy position. General anesthesia was administered. The patient was appropriately identified during a pre-procedure timeout. The patient was prepped and draped in the normal sterile fashion. A speculum was placed in the vagina a single-tooth tenaculum was placed on the anterior lip of the cervix. A paracervical block was administered with 10 cc of 1% lidocaine without epinephrine. The cervix was serially dilated with Hank dilators. The uterine cavity sounded to 9.5 cm. The hysteroscope was then introduced and a survey of the intrauterine cavity was performed. Bilateral tubal ostia were visualized. The endometrium was noted to be thin. The hysteroscope was removed and curettage was performed with resultant endometrial curettings. At this point the NovaSure ray was introduced transcervically and deployed. A big cavity width was measured at 4.0 cm a cavity assessment was performed which passed. The NovaSure ablation procedure was initiated for a total of 2 minutes. Following completion of the ablation the  NovaSure device was removed and the hysteroscope was reintroduced. A survey of the intrauterine cavity demonstrated an adequate ablation of bilateral cornua as well as the remainder of the intrauterine cavity. This completed the surgical procedure. The sponge lap and needle counts were correct x2. The patient was taken to the postanesthesia care unit in good condition after the procedure.

## 2012-07-19 NOTE — Anesthesia Procedure Notes (Signed)
Procedure Name: LMA Insertion Date/Time: 07/19/2012 12:07 PM Performed by: Benjamin Casanas, Jannet Askew Pre-anesthesia Checklist: Patient identified, Patient being monitored, Emergency Drugs available, Timeout performed and Suction available Patient Re-evaluated:Patient Re-evaluated prior to inductionOxygen Delivery Method: Circle system utilized Preoxygenation: Pre-oxygenation with 100% oxygen Intubation Type: IV induction LMA: LMA inserted LMA Size: 4.0 Number of attempts: 1 Dental Injury: Teeth and Oropharynx as per pre-operative assessment

## 2012-07-19 NOTE — Anesthesia Preprocedure Evaluation (Signed)
Anesthesia Evaluation  Patient identified by MRN, date of birth, ID band Patient awake    Reviewed: Allergy & Precautions, H&P , NPO status , Patient's Chart, lab work & pertinent test results  Airway Mallampati: II TM Distance: >3 FB Neck ROM: full    Dental no notable dental hx. (+) Teeth Intact   Pulmonary neg pulmonary ROS,    Pulmonary exam normal       Cardiovascular negative cardio ROS      Neuro/Psych negative psych ROS   GI/Hepatic negative GI ROS, Neg liver ROS,   Endo/Other  Morbid obesity  Renal/GU negative Renal ROS  negative genitourinary   Musculoskeletal negative musculoskeletal ROS (+)   Abdominal Normal abdominal exam  (+) + obese,   Peds negative pediatric ROS (+)  Hematology negative hematology ROS (+)   Anesthesia Other Findings   Reproductive/Obstetrics negative OB ROS                           Anesthesia Physical Anesthesia Plan  ASA: III  Anesthesia Plan: General   Post-op Pain Management:    Induction: Intravenous  Airway Management Planned: LMA  Additional Equipment:   Intra-op Plan:   Post-operative Plan:   Informed Consent: I have reviewed the patients History and Physical, chart, labs and discussed the procedure including the risks, benefits and alternatives for the proposed anesthesia with the patient or authorized representative who has indicated his/her understanding and acceptance.     Plan Discussed with: CRNA and Surgeon  Anesthesia Plan Comments:         Anesthesia Quick Evaluation

## 2012-07-19 NOTE — H&P (Signed)
Margaret Fletcher is an 42 y.o. female presents for novasure endometrial ablation due to menorrhagia  42 yo AA female presents for surgical management of menorrhagia.  She has had heavy menstrual cycles over he last several years.  She declines medical intervention and wishes to proceed with surgical management with endometrial ablation.  R/B/A of the procedure were discussed at length with the patient.  US showed uterus to be 9.3 x 5.4 x 4.9 with one right lateral fibroid  Measuring 1.86cm [name] was 06/24/2012.    Past Medical History  Diagnosis Date  . Alopecia   . Autoimmune disease   . Bacterial vaginosis   . Migraine headache     Past Surgical History  Procedure Laterality Date  . Cesarean section    . Cholecystectomy    . Appendectomy    . Cesarean section    . Wisdom tooth extraction    . Tubal ligation      No family history on file.  Social History:  reports that she has never smoked. She has never used smokeless tobacco. She reports that  drinks alcohol. She reports that she does not use illicit drugs.  Allergies:  Allergies  Allergen Reactions  . Sulfa Antibiotics Anaphylaxis    Prescriptions prior to admission  Medication Sig Dispense Refill  . fluconazole (DIFLUCAN) 150 MG tablet Take 150 mg by mouth as needed (for yeast infections).      Marland Kitchen ibuprofen (ADVIL,MOTRIN) 800 MG tablet Take 800 mg by mouth every 8 (eight) hours as needed for pain.      . Multiple Vitamins-Minerals (MULTIVITAMIN WITH MINERALS) tablet Take 1 tablet by mouth daily.      . Probiotic Product (PROBIOTIC DAILY PO) Take 1 tablet by mouth daily.      . SUMAtriptan (IMITREX) 25 MG tablet Take 25 mg by mouth every 2 (two) hours as needed. For migraines        ROS  Height 5\' 8"  (1.727 m), weight 136.079 kg (300 lb), last menstrual period 06/24/2012. Physical Exam  Results for orders placed during the hospital encounter of 07/19/12 (from the past 24 hour(s))  CBC      Status: Abnormal   Collection Time    07/19/12  9:19 AM      Result Value Range   WBC 9.2  4.0 - 10.5 K/uL   RBC 4.21  3.87 - 5.11 MIL/uL   Hemoglobin 11.7 (*) 12.0 - 15.0 g/dL   HCT 45.4 (*) 09.8 - 11.9 %   MCV 85.0  78.0 - 100.0 fL   MCH 27.8  26.0 - 34.0 pg   MCHC 32.7  30.0 - 36.0 g/dL   RDW 14.7  82.9 - 56.2 %   Platelets 357  150 - 400 K/uL    No results found.  Assessment/Plan: 1) Proceed with Novasure endometrial ablation and d& c  Margaret Fletcher H. 07/19/2012, 11:31 AM

## 2012-07-19 NOTE — Anesthesia Postprocedure Evaluation (Signed)
  Anesthesia Post-op Note  Patient: Margaret Fletcher  Procedure(s) Performed: Procedure(s): HYSTEROSCOPY WITH NOVASURE ABLATION (N/A)  Patient Location: PACU  Anesthesia Type:General  Level of Consciousness: awake, alert  and oriented  Airway and Oxygen Therapy: Patient Spontanous Breathing  Post-op Pain: mild  Post-op Assessment: Post-op Vital signs reviewed, Patient's Cardiovascular Status Stable, Respiratory Function Stable, Patent Airway, No signs of Nausea or vomiting and Pain level controlled. Patient had some chest pain radiating to back in PACU. 12 lead EKG normal. Given Mylanta po with subsequent resolution of pain. Suspect this was related to GER or esophageal spasm.  Post-op Vital Signs: Reviewed and stable  Complications: No apparent anesthesia complications

## 2012-07-20 ENCOUNTER — Encounter (HOSPITAL_COMMUNITY): Payer: Self-pay | Admitting: Obstetrics and Gynecology

## 2012-08-12 ENCOUNTER — Other Ambulatory Visit (HOSPITAL_COMMUNITY)
Admission: RE | Admit: 2012-08-12 | Discharge: 2012-08-12 | Disposition: A | Discharge status: Home / Self Care | Payer: 59 | Source: Ambulatory Visit | Attending: Emergency Medicine | Admitting: Emergency Medicine

## 2012-08-12 ENCOUNTER — Encounter (HOSPITAL_COMMUNITY): Payer: Self-pay | Admitting: *Deleted

## 2012-08-12 ENCOUNTER — Emergency Department (HOSPITAL_COMMUNITY)
Admission: EM | Admit: 2012-08-12 | Discharge: 2012-08-12 | Disposition: A | Discharge status: Home / Self Care | Payer: 59 | Source: Home / Self Care

## 2012-08-12 DIAGNOSIS — N898 Other specified noninflammatory disorders of vagina: Secondary | ICD-10-CM

## 2012-08-12 DIAGNOSIS — A499 Bacterial infection, unspecified: Secondary | ICD-10-CM

## 2012-08-12 DIAGNOSIS — R350 Frequency of micturition: Secondary | ICD-10-CM

## 2012-08-12 DIAGNOSIS — N3289 Other specified disorders of bladder: Secondary | ICD-10-CM

## 2012-08-12 DIAGNOSIS — N760 Acute vaginitis: Secondary | ICD-10-CM

## 2012-08-12 DIAGNOSIS — Z113 Encounter for screening for infections with a predominantly sexual mode of transmission: Secondary | ICD-10-CM | POA: Insufficient documentation

## 2012-08-12 DIAGNOSIS — B9689 Other specified bacterial agents as the cause of diseases classified elsewhere: Secondary | ICD-10-CM

## 2012-08-12 LAB — POCT URINALYSIS DIP (DEVICE)
Bilirubin Urine: NEGATIVE
Ketones, ur: NEGATIVE mg/dL
Specific Gravity, Urine: >=1.03 (ref 1.005–1.030)
pH: 5.5 (ref 5.0–8.0)

## 2012-08-12 MED ORDER — CEPHALEXIN 500 MG PO CAPS: 500.0000 mg | ORAL_CAPSULE | Freq: Four times a day (QID) | ORAL | Status: DC

## 2012-08-12 MED ORDER — METRONIDAZOLE 500 MG PO TABS: 500.0000 mg | ORAL_TABLET | Freq: Two times a day (BID) | ORAL | Status: DC

## 2012-08-12 MED ORDER — FLUCONAZOLE 150 MG PO TABS: ORAL_TABLET | ORAL | Status: DC

## 2012-08-12 NOTE — ED Provider Notes (Signed)
Medical screening examination/treatment/procedure(s) were performed by non-physician practitioner and as supervising physician I was immediately available for consultation/collaboration.  Stuart Guillen   Vong Garringer, MD 08/12/12 1227 

## 2012-08-12 NOTE — ED Notes (Signed)
Pt reports 4-5 days of urinary frequency and pelvic pressure.  She denies fever.   She had a uterine ablation 07/19/2012 and has had some vaginal discharge since then, but she seems like it may have gotten worse the past 2 days

## 2012-08-12 NOTE — ED Provider Notes (Signed)
And a History     CSN: [id number]  Arrival date & time 08/12/12  1011   None     Chief Complaint  Patient presents with  . Urinary Tract Infection    (Consider location/radiation/quality/duration/timing/severity/associated sxs/prior treatment) HPI Comments: This is a 42 year old morbidly obese female is approximately 3 weeks status post a uterine ablation. Since that procedure she has had scant vaginal discharge but feels that the discharge has increased in volume in the last 2-3 days. She is also complaining of urinary frequency low abnormal/pelvic pressure upon urination and pain in the lower most back. The symptoms began approximately 4-5 days ago. She denies fever, chills, neck or upper abdominal pain, nausea, vomiting or other constitutional symptoms.   Past Medical History  Diagnosis Date  . Alopecia   . Autoimmune disease   . Bacterial vaginosis   . Migraine headache     Past Surgical History  Procedure Laterality Date  . Cesarean section    . Cholecystectomy    . Appendectomy    . Cesarean section    . Wisdom tooth extraction    . Tubal ligation    . Dilitation & currettage/hystroscopy with novasure ablation N/A 07/19/2012    Procedure: HYSTEROSCOPY WITH NOVASURE ABLATION;  Surgeon: Freddrick March. Tenny Craw, MD;  Location: [facility];  Service: Gynecology;  Laterality: N/A;    No family history on file.  History  Substance Use Topics  . Smoking status: Never Smoker   . Smokeless tobacco: Never Used  . Alcohol Use: Yes     Comment: socially    OB History   Grav Para Term Preterm Abortions TAB SAB Ect Mult Living                  Review of Systems  Constitutional: Negative.   Respiratory: Negative.   Cardiovascular: Negative.   Gastrointestinal: Negative.   Genitourinary: Positive for urgency, frequency, vaginal discharge and pelvic pain. Negative for dysuria, hematuria and flank pain.  Musculoskeletal: Negative.   Neurological: Negative.   Psychiatric/Behavioral:  Negative.     Allergies  Sulfa antibiotics  Home Medications   Current Outpatient Rx  Name  Route  Sig  Dispense  Refill  . ibuprofen (ADVIL,MOTRIN) 800 MG tablet   Oral   Take 800 mg by mouth every 8 (eight) hours as needed for pain.         . Multiple Vitamins-Minerals (MULTIVITAMIN WITH MINERALS) tablet   Oral   Take 1 tablet by mouth daily.         . Probiotic Product (PROBIOTIC DAILY PO)   Oral   Take 1 tablet by mouth daily.         . cephALEXin (KEFLEX) 500 MG capsule   Oral   Take 1 capsule (500 mg total) by mouth 4 (four) times daily.   28 capsule   0   . fluconazole (DIFLUCAN) 150 MG tablet   Oral   Take 150 mg by mouth as needed (for yeast infections).         . fluconazole (DIFLUCAN) 150 MG tablet      1 tab po x 1. May repeat in 72 hours if no improvement   2 tablet   0   . metroNIDAZOLE (FLAGYL) 500 MG tablet   Oral   Take 1 tablet (500 mg total) by mouth 2 (two) times daily. X 7 days   14 tablet   0   . SUMAtriptan (IMITREX) 25 MG tablet   Oral  Take 25 mg by mouth every 2 (two) hours as needed. For migraines           BP 120/74  Pulse 64  Temp(Src) 97.9 F (36.6 C) (Oral)  Resp 16  SpO2 100%  LMP 07/07/2012  Physical Exam  Nursing note and vitals reviewed. Constitutional: She is oriented to person, place, and time. She appears well-developed and well-nourished. No distress.  Eyes: EOM are normal.  Neck: Normal range of motion. Neck supple.  Cardiovascular: Normal rate.   Pulmonary/Chest: Effort normal. No respiratory distress.  Abdominal: Soft. She exhibits no distension and no mass. There is no tenderness. There is no rebound and no guarding.  Genitourinary:  Normal external female genitalia. Upon admission of the speculum redundant vaginal walls block most of the view of the cervix. There is creamy gray vaginal discharge along the vaginal walls including the cervix. Only a portion of the cervix is visible. No lesions or  bleeding. No CMT with manipulation of the speculum or large Q-tip.  Musculoskeletal: She exhibits no edema and no tenderness.  Neurological: She is alert and oriented to person, place, and time. She exhibits normal muscle tone.  Skin: Skin is warm and dry.  Psychiatric: She has a normal mood and affect.    ED Course  Procedures (including critical care time)  Labs Reviewed  POCT URINALYSIS DIP (DEVICE) - Abnormal; Notable for the following:    Hgb urine dipstick TRACE (*)    Protein, ur 30 (*)    All other components within normal limits  URINE CULTURE  CERVICOVAGINAL ANCILLARY ONLY   No results found.   1. Urinary frequency   2. Bladder spasms   3. Vaginal discharge   4. BV (bacterial vaginosis)       MDM  Patient is treated. Leave for what looks to be BV. Flagyl 500 mg twice a day for 7 days. Only the urinalysis does not reflect a UTI at this point her symptomatology suggests that she is having one. Treat with Keflex 500 mg 4 times a day for 7 days. Drink plenty of fluids stay well hydrated. Swabs were obtained for testing for the Affirm and DNA testing. Patient is discharged in stable condition. Results for orders placed during the hospital encounter of 08/12/12  POCT URINALYSIS DIP (DEVICE)      Result Value Range   Glucose, UA NEGATIVE  NEGATIVE mg/dL   Bilirubin Urine NEGATIVE  NEGATIVE   Ketones, ur NEGATIVE  NEGATIVE mg/dL   Specific Gravity, Urine >=1.030  1.005 - 1.030   Hgb urine dipstick TRACE (*) NEGATIVE   pH 5.5  5.0 - 8.0   Protein, ur 30 (*) NEGATIVE mg/dL   Urobilinogen, UA 0.2  0.0 - 1.0 mg/dL   Nitrite NEGATIVE  NEGATIVE   Leukocytes, UA NEGATIVE  NEGATIVE     Hayden Rasmussen, NP 08/12/12 1137

## 2012-08-13 LAB — URINE CULTURE
Colony Count: NO GROWTH
Culture: NO GROWTH

## 2012-08-14 NOTE — ED Notes (Signed)
Chart review.

## 2012-08-16 NOTE — ED Notes (Signed)
GC/Chlamydia neg., Affirm: Candida and Gardnerella pos., Trich neg., Pt. adequately treated with Diflucan and Flagyl. Vassie Moselle 08/16/2012

## 2012-12-28 ENCOUNTER — Encounter (HOSPITAL_COMMUNITY): Payer: Self-pay | Admitting: Emergency Medicine

## 2012-12-28 ENCOUNTER — Emergency Department (HOSPITAL_COMMUNITY)
Admission: EM | Admit: 2012-12-28 | Discharge: 2012-12-28 | Disposition: A | Discharge status: Home / Self Care | Payer: 59 | Source: Home / Self Care | Attending: Family Medicine | Admitting: Family Medicine

## 2012-12-28 DIAGNOSIS — B3731 Acute candidiasis of vulva and vagina: Secondary | ICD-10-CM

## 2012-12-28 DIAGNOSIS — B373 Candidiasis of vulva and vagina: Secondary | ICD-10-CM

## 2012-12-28 MED ORDER — FLUCONAZOLE 150 MG PO TABS: 150.0000 mg | ORAL_TABLET | Freq: Once | ORAL | Status: DC

## 2012-12-28 NOTE — ED Provider Notes (Signed)
Margaret Fletcher is a 42 y.o. female who presents to Urgent Care today for vaginal itching. Patient recently finished Keflex for her urinary tract infection. She notes gradual onset of itching after finishing antibiotics. Her symptoms have worsened recently. She notes that the symptoms are consistent with prior episodes of yeast infection. She denies any vaginal discharge or STD exposure. She has had multiple yeast infections in the past and this is very consistent. She has a followup appointment with her primary care provider in about one week and would like to avoid a gynecologic exam of possible.    Past Medical History  Diagnosis Date  . Alopecia   . Autoimmune disease   . Bacterial vaginosis   . Migraine headache    History  Substance Use Topics  . Smoking status: Never Smoker   . Smokeless tobacco: Never Used  . Alcohol Use: Yes     Comment: socially   ROS as above Medications reviewed. No current facility-administered medications for this encounter.   Current Outpatient Prescriptions  Medication Sig Dispense Refill  . fluconazole (DIFLUCAN) 150 MG tablet Take 1 tablet (150 mg total) by mouth once.  1 tablet  1  . ibuprofen (ADVIL,MOTRIN) 800 MG tablet Take 800 mg by mouth every 8 (eight) hours as needed for pain.      . Multiple Vitamins-Minerals (MULTIVITAMIN WITH MINERALS) tablet Take 1 tablet by mouth daily.      . Probiotic Product (PROBIOTIC DAILY PO) Take 1 tablet by mouth daily.      . SUMAtriptan (IMITREX) 25 MG tablet Take 25 mg by mouth every 2 (two) hours as needed. For migraines        Exam:  BP 93/67  Pulse 74  Temp(Src) 98.1 F (36.7 C) (Oral)  Resp 20  SpO2 99% Gen: Well NAD HEENT: EOMI,  MMM Lungs: CTABL Nl WOB Heart: RRR no MRG Abd: NABS, NT, ND Exts: Non edematous BL  LE, warm and well perfused.   No results found for this or any previous visit (from the past 24 hour(s)). No results found.  Assessment and Plan: 42 y.o. female with yeast  vaginitis. Symptoms are consistent with yeast vaginitis. I feel is reasonable to treat empirically with fluconazole. If not better follow up with primary care provider or back careful gynecologic exam with testing. Discussed warning signs or symptoms. Please see discharge instructions. Patient expresses understanding.      Rodolph Bong, MD 12/28/12 1009

## 2012-12-28 NOTE — ED Notes (Signed)
States she completed antibiotics about 2 weeks ago , and since then has had a gradual development of vaginal irritation associated w a yeast infection that she reportedly has w each antibiotic; denies STD concerns

## 2013-03-20 ENCOUNTER — Encounter: Payer: 59 | Attending: Internal Medicine | Admitting: *Deleted

## 2013-03-20 ENCOUNTER — Encounter: Payer: Self-pay | Admitting: *Deleted

## 2013-03-20 DIAGNOSIS — E7800 Pure hypercholesterolemia, unspecified: Secondary | ICD-10-CM | POA: Insufficient documentation

## 2013-03-20 DIAGNOSIS — Z713 Dietary counseling and surveillance: Secondary | ICD-10-CM | POA: Insufficient documentation

## 2013-03-20 DIAGNOSIS — E669 Obesity, unspecified: Secondary | ICD-10-CM | POA: Insufficient documentation

## 2013-03-20 DIAGNOSIS — Z6841 Body Mass Index (BMI) 40.0 and over, adult: Secondary | ICD-10-CM | POA: Insufficient documentation

## 2013-03-20 NOTE — Progress Notes (Signed)
Medical Nutrition Therapy:  Appt start time: 0800 end time:  0900.  Assessment:  Patient here today for obesity and elevated cholesterol. She reports that she has eaten the same way her whole life, but since finding out she has high cholesterol and possibly pre-diabetes, she wants to make changes to improve her health. She works at [employer] afrom 7p-7a 3 days weekly. 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 She reports snacking throughout night at work, usually on unhealthy foods. She also drinks a lot of sweetened drinks. Portion control is an issue for her. Patient doesn't currently exercise, but has been looking into yoga, pilates, and spin classes.   MEDICATIONS: Reviewed   DIETARY INTAKE:   Usual eating pattern includes 3 meals and 1-2 snacks per day.  24-hr recall:  B ( AM): Sometimes, Corn flakes, banana, sugar, 2% milk OR 3 scrambled eggs, 3 slices toast, 8 oz orange juice  Snk ( AM): None  L ( PM): Fast food (burger, fries, sweet tea) Snk ( PM): None D ( PM): 8 Chicken wings, salad with bleu cheese 1 packet, Mountain Dew Snk (4 AM): Vending machine, mozzarella sticks, Peanut butter and crackers, sandwich Beverages: Orange juice, Mt. Dew, sweet tea, water  Usual physical activity: None  Estimated energy needs: 1500 calories 188 g carbohydrates 94 g protein 42 g fat  Progress Towards Goal(s):  In progress.   Nutritional Diagnosis:  [place]-3.3 Overweight/obesity As related to excessive energy intake and physical inactivity.  As evidenced by BMI 46.7.    Intervention:  Nutrition counseling. Patient educated on general healthful eating, including the importance of regular balanced meals, healthy snacking, and portion control.   Goals:  1. 0.5-1 pound weight loss per week.  2. Balance nutrients at meals using the plate method. 3. Monitor portion size of foods.  4. Bring healthy snacks to work (yogurt,  fruit, peanut butter and crackers, moderate portions of nuts). Limits snacks to no more than 300 calories per day.  5. Limit sweetened drinks to 1 [facility] on work days. Consider cutting back more.  6. Exercise at least 30 minutes 3 days weekly.   Handouts given during visit include:  Weight loss tips  Meal plan card  Monitoring/Evaluation:  Dietary intake, exercise, and body weight in 2 month(s).

## 2013-03-21 ENCOUNTER — Emergency Department (HOSPITAL_COMMUNITY)
Admission: EM | Admit: 2013-03-21 | Discharge: 2013-03-21 | Disposition: A | Discharge status: Home / Self Care | Payer: 59 | Attending: Emergency Medicine | Admitting: Emergency Medicine

## 2013-03-21 ENCOUNTER — Emergency Department (HOSPITAL_COMMUNITY): Payer: 59

## 2013-03-21 ENCOUNTER — Encounter (HOSPITAL_COMMUNITY): Payer: Self-pay | Admitting: Radiology

## 2013-03-21 DIAGNOSIS — Z872 Personal history of diseases of the skin and subcutaneous tissue: Secondary | ICD-10-CM | POA: Insufficient documentation

## 2013-03-21 DIAGNOSIS — R109 Unspecified abdominal pain: Secondary | ICD-10-CM | POA: Diagnosis present

## 2013-03-21 DIAGNOSIS — Z8742 Personal history of other diseases of the female genital tract: Secondary | ICD-10-CM | POA: Insufficient documentation

## 2013-03-21 DIAGNOSIS — D7389 Other diseases of spleen: Secondary | ICD-10-CM | POA: Insufficient documentation

## 2013-03-21 DIAGNOSIS — Z862 Personal history of diseases of the blood and blood-forming organs and certain disorders involving the immune mechanism: Secondary | ICD-10-CM | POA: Insufficient documentation

## 2013-03-21 DIAGNOSIS — R110 Nausea: Secondary | ICD-10-CM | POA: Insufficient documentation

## 2013-03-21 DIAGNOSIS — Z8639 Personal history of other endocrine, nutritional and metabolic disease: Secondary | ICD-10-CM | POA: Insufficient documentation

## 2013-03-21 DIAGNOSIS — D734 Cyst of spleen: Secondary | ICD-10-CM | POA: Diagnosis present

## 2013-03-21 DIAGNOSIS — K7689 Other specified diseases of liver: Secondary | ICD-10-CM | POA: Insufficient documentation

## 2013-03-21 DIAGNOSIS — K769 Liver disease, unspecified: Secondary | ICD-10-CM | POA: Diagnosis present

## 2013-03-21 DIAGNOSIS — Z79899 Other long term (current) drug therapy: Secondary | ICD-10-CM | POA: Insufficient documentation

## 2013-03-21 DIAGNOSIS — Z8619 Personal history of other infectious and parasitic diseases: Secondary | ICD-10-CM | POA: Insufficient documentation

## 2013-03-21 DIAGNOSIS — G43909 Migraine, unspecified, not intractable, without status migrainosus: Secondary | ICD-10-CM | POA: Insufficient documentation

## 2013-03-21 LAB — URINALYSIS W MICROSCOPIC + REFLEX CULTURE
Bilirubin Urine: NEGATIVE
Glucose, UA: NEGATIVE mg/dL
Hgb urine dipstick: NEGATIVE
Ketones, ur: NEGATIVE mg/dL
LEUKOCYTES UA: NEGATIVE
NITRITE: NEGATIVE
PH: 6 (ref 5.0–8.0)
PROTEIN: NEGATIVE mg/dL
Specific Gravity, Urine: 1.01 (ref 1.005–1.030)
Urobilinogen, UA: 0.2 mg/dL (ref 0.0–1.0)

## 2013-03-21 LAB — TROPONIN I: Troponin I: <0.3 ng/mL (ref <0.30)

## 2013-03-21 LAB — CBC WITH DIFFERENTIAL/PLATELET
BASOS ABS: 0 10*3/uL (ref 0.0–0.1)
Basophils Relative: 0 % (ref 0–1)
EOS ABS: 0.2 10*3/uL (ref 0.0–0.7)
EOS PCT: 2 % (ref 0–5)
HCT: 38 % (ref 36.0–46.0)
Hemoglobin: 12.7 g/dL (ref 12.0–15.0)
Lymphocytes Relative: 15 % (ref 12–46)
Lymphs Abs: 1.8 10*3/uL (ref 0.7–4.0)
MCH: 29.1 pg (ref 26.0–34.0)
MCHC: 33.4 g/dL (ref 30.0–36.0)
MCV: 87.2 fL (ref 78.0–100.0)
MONO ABS: 0.8 10*3/uL (ref 0.1–1.0)
Monocytes Relative: 6 % (ref 3–12)
Neutro Abs: 9.8 10*3/uL — ABNORMAL HIGH (ref 1.7–7.7)
Neutrophils Relative %: 77 % (ref 43–77)
Platelets: 352 10*3/uL (ref 150–400)
RBC: 4.36 MIL/uL (ref 3.87–5.11)
RDW: 13.8 % (ref 11.5–15.5)
WBC: 12.7 10*3/uL — AB (ref 4.0–10.5)

## 2013-03-21 LAB — LIPASE, BLOOD: LIPASE: 29 U/L (ref 11–59)

## 2013-03-21 LAB — COMPREHENSIVE METABOLIC PANEL
ALT: 20 U/L (ref 0–35)
AST: 21 U/L (ref 0–37)
Albumin: 3.2 g/dL — ABNORMAL LOW (ref 3.5–5.2)
Alkaline Phosphatase: 79 U/L (ref 39–117)
BUN: 22 mg/dL (ref 6–23)
CALCIUM: 8.4 mg/dL (ref 8.4–10.5)
CO2: 26 mEq/L (ref 19–32)
CREATININE: 1.14 mg/dL — AB (ref 0.50–1.10)
Chloride: 100 mEq/L (ref 96–112)
GFR calc Af Amer: 67 mL/min — ABNORMAL LOW (ref >90)
GFR calc non Af Amer: 58 mL/min — ABNORMAL LOW (ref >90)
Glucose, Bld: 94 mg/dL (ref 70–99)
Potassium: 3.8 mEq/L (ref 3.7–5.3)
SODIUM: 137 meq/L (ref 137–147)
Total Bilirubin: 0.2 mg/dL — ABNORMAL LOW (ref 0.3–1.2)
Total Protein: 7.1 g/dL (ref 6.0–8.3)

## 2013-03-21 MED ORDER — OXYCODONE-ACETAMINOPHEN 5-325 MG PO TABS: 1.0000 | ORAL_TABLET | Freq: Four times a day (QID) | ORAL | Status: DC | PRN

## 2013-03-21 MED ORDER — ONDANSETRON HCL 4 MG/2ML IJ SOLN
4.0000 mg | Freq: Once | INTRAMUSCULAR | Status: AC
  Administered 2013-03-21: 4 mg via INTRAVENOUS
  Filled 2013-03-21: qty 2

## 2013-03-21 MED ORDER — HYDROMORPHONE HCL PF 1 MG/ML IJ SOLN
0.5000 mg | Freq: Once | INTRAMUSCULAR | Status: AC
  Administered 2013-03-21: 0.5 mg via INTRAVENOUS
  Filled 2013-03-21: qty 1

## 2013-03-21 MED ORDER — METOCLOPRAMIDE HCL 5 MG/ML IJ SOLN
5.0000 mg | Freq: Once | INTRAMUSCULAR | Status: AC
  Administered 2013-03-21: 5 mg via INTRAVENOUS
  Filled 2013-03-21: qty 2

## 2013-03-21 MED ORDER — HYDROMORPHONE HCL PF 1 MG/ML IJ SOLN
1.0000 mg | INTRAMUSCULAR | Status: AC
  Administered 2013-03-21: 1 mg via INTRAVENOUS
  Filled 2013-03-21: qty 1

## 2013-03-21 MED ORDER — SODIUM CHLORIDE 0.9 % IV BOLUS (SEPSIS)
1000.0000 mL | INTRAVENOUS | Status: AC
  Administered 2013-03-21: 1000 mL via INTRAVENOUS

## 2013-03-21 MED ORDER — IOHEXOL 300 MG/ML  SOLN
100.0000 mL | Freq: Once | INTRAMUSCULAR | Status: AC | PRN
  Administered 2013-03-21: 100 mL via INTRAVENOUS

## 2013-03-21 MED ORDER — METOCLOPRAMIDE HCL 10 MG PO TABS: 10.0000 mg | ORAL_TABLET | Freq: Three times a day (TID) | ORAL | Status: DC | PRN

## 2013-03-21 MED ORDER — IOHEXOL 300 MG/ML  SOLN
50.0000 mL | Freq: Once | INTRAMUSCULAR | Status: AC | PRN
  Administered 2013-03-21: 50 mL via ORAL

## 2013-03-21 NOTE — ED Notes (Signed)
Bed: WA11 Expected date:  Expected time:  Means of arrival:  Comments: 

## 2013-03-21 NOTE — ED Provider Notes (Signed)
CSN: [id number]     Arrival date & time 03/21/13  1135 History   First MD Initiated Contact with Patient 03/21/13 1140     Chief Complaint  Patient presents with  . Abdominal Pain   (Consider location/radiation/quality/duration/timing/severity/associated sxs/prior Treatment) Patient is a 43 y.o. female presenting with abdominal pain. The history is provided by the patient.  Abdominal Pain Pain location:  Epigastric Pain quality: pressure   Pain radiation: to chest. Pain severity:  Moderate Onset quality:  Gradual Duration:  4 hours Timing:  Constant Progression:  Worsening Chronicity:  New Context comment:  After getting off work in the ED Relieved by:  Nothing Worsened by:  Nothing tried Ineffective treatments:  None tried Associated symptoms: nausea   Associated symptoms: no chest pain, no cough, no diarrhea, no dysuria, no fatigue, no fever, no hematuria, no shortness of breath and no vomiting     Past Medical History  Diagnosis Date  . Alopecia   . Autoimmune disease   . Bacterial vaginosis   . Migraine headache    Past Surgical History  Procedure Laterality Date  . Cesarean section    . Cholecystectomy    . Appendectomy    . Cesarean section    . Wisdom tooth extraction    . Tubal ligation    . Dilitation & currettage/hystroscopy with novasure ablation N/A 07/19/2012    Procedure: HYSTEROSCOPY WITH NOVASURE ABLATION;  Surgeon: Farrel Gobble. Harrington Challenger, MD;  Location: [place] [facility];  Service: Gynecology;  Laterality: N/A;   No family history on file. History  Substance Use Topics  . Smoking status: Never Smoker   . Smokeless tobacco: Never Used  . Alcohol Use: Yes     Comment: socially   OB History   Grav Para Term Preterm Abortions TAB SAB Ect Mult Living                 Review of Systems  Constitutional: Negative for fever and fatigue.  HENT: Negative for congestion and drooling.   Eyes: Negative for pain.  Respiratory: Negative for cough and shortness of breath.    Cardiovascular: Negative for chest pain.  Gastrointestinal: Positive for nausea and abdominal pain. Negative for vomiting and diarrhea.  Genitourinary: Negative for dysuria and hematuria.  Musculoskeletal: Negative for back pain, gait problem and neck pain.  Skin: Negative for color change.  Neurological: Negative for dizziness and headaches.  Hematological: Negative for adenopathy.  Psychiatric/Behavioral: Negative for behavioral problems.  All other systems reviewed and are negative.    Allergies  Sulfa antibiotics  Home Medications   Current Outpatient Rx  Name  Route  Sig  Dispense  Refill  . ibuprofen (ADVIL,MOTRIN) 800 MG tablet   Oral   Take 800 mg by mouth every 8 (eight) hours as needed for pain.         . Multiple Vitamins-Minerals (MULTIVITAMIN WITH MINERALS) tablet   Oral   Take 1 tablet by mouth daily.         . Probiotic Product (PROBIOTIC DAILY PO)   Oral   Take 1 tablet by mouth daily.         . SUMAtriptan (IMITREX) 25 MG tablet   Oral   Take 25 mg by mouth every 2 (two) hours as needed. For migraines         . Vitamin D, Ergocalciferol, (DRISDOL) 50000 UNITS CAPS capsule   Oral   Take 50,000 Units by mouth every 7 (seven) days.  BP 158/98  Pulse 84  Temp(Src) 98.2 F (36.8 C) (Oral)  Resp 20  SpO2 100% Physical Exam  Nursing note and vitals reviewed. Constitutional: She is oriented to person, place, and time. She appears well-developed and well-nourished.  Pt appears uncomfortable.   HENT:  Head: Normocephalic.  Mouth/Throat: Oropharynx is clear and moist. No oropharyngeal exudate.  Eyes: Conjunctivae and EOM are normal. Pupils are equal, round, and reactive to light.  Neck: Normal range of motion. Neck supple.  Cardiovascular: Normal rate, regular rhythm, normal heart sounds and intact distal pulses.  Exam reveals no gallop and no friction rub.   No murmur heard. Pulmonary/Chest: Effort normal and breath sounds normal. No  respiratory distress. She has no wheezes.  Abdominal: Soft. Bowel sounds are normal. There is tenderness (mild ttp in epig area). There is no rebound and no guarding.  Musculoskeletal: Normal range of motion. She exhibits no edema and no tenderness.  Neurological: She is alert and oriented to person, place, and time.  Skin: Skin is warm and dry.  Psychiatric: She has a normal mood and affect. Her behavior is normal.    ED Course  Procedures (including critical care time) Labs Review Labs Reviewed  CBC WITH DIFFERENTIAL - Abnormal; Notable for the following:    WBC 12.7 (*)    Neutro Abs 9.8 (*)    All other components within normal limits  COMPREHENSIVE METABOLIC PANEL - Abnormal; Notable for the following:    Creatinine, Ser 1.14 (*)    Albumin 3.2 (*)    Total Bilirubin 0.2 (*)    GFR calc non Af Amer 58 (*)    GFR calc Af Amer 67 (*)    All other components within normal limits  TROPONIN I  LIPASE, BLOOD  URINALYSIS W MICROSCOPIC + REFLEX CULTURE   Imaging Review Ct Abdomen Pelvis W Contrast  03/21/2013   CLINICAL DATA:  Epigastric pain  EXAM: CT ABDOMEN AND PELVIS WITH CONTRAST  TECHNIQUE: Multidetector CT imaging of the abdomen and pelvis was performed using the standard protocol following bolus administration of intravenous contrast.  CONTRAST:  140mL OMNIPAQUE IOHEXOL 300 MG/ML  SOLN  COMPARISON:  11/21/2012  FINDINGS: Lung bases are free of acute infiltrate or sizable effusion.  The liver shows evidence of a vague 1.5 cm hypodense lesion. This has grown in size from a prior exam from August of 2013. MRI of the liver is warranted for further evaluation given the increased size from the prior exam. The gallbladder has been surgically removed. The spleen demonstrates a cystic lesion which now measures 2.6 cm. It has grown in size from a prior exam at which time it measured 1.4 cm in greatest dimension. The adrenal glands and pancreas are within normal limits. The kidneys are well  visualized bilaterally and demonstrate a normal enhancement pattern. Delayed images demonstrate normal excretion of contrast material bilaterally. No calculi or obstructive changes are seen.  The appendix is been surgically removed. Scattered large and small bowel gas is seen without obstructive change. The uterus demonstrates some lobular appearance with areas of decreased attenuation within consistent with uterine fibroid disease. The bladder is well distended. No pelvic mass lesion is seen. Sclerotic changes of the sacroiliac joints are noted which are stable from the prior study.  IMPRESSION: Hypodensity within the liver which is increased in size from the prior exam. MRI of the liver is warranted given the growth over time.  Enlarging splenic cyst.  No acute abnormality is noted.   Electronically  Signed   By: Inez Catalina M.D.   On: 03/21/2013 15:11   Dg Abd Acute W/chest  03/21/2013   CLINICAL DATA:  Pain  EXAM: ACUTE ABDOMEN SERIES (ABDOMEN 2 VIEW & CHEST 1 VIEW)  COMPARISON:  CT abdomen and pelvis November 21, 2012  FINDINGS: PA chest: No edema or consolidation. Heart size and pulmonary vascularity are normal. No adenopathy.  Supine and upright abdomen: There is no bowel dilatation. There are scattered air-fluid levels throughout the abdomen. No free air. There is moderate stool in the colon. There are surgical clips in the gallbladder fossa region. There are phleboliths in the pelvis.  IMPRESSION: Scattered air-fluid levels in the abdomen without bowel dilatation. Suspect a degree of enteritis or early ileus. Bowel obstruction is felt to be less likely. No free air. No lung edema or consolidation.   Electronically Signed   By: Lowella Grip M.D.   On: 03/21/2013 13:24    EKG Interpretation    Date/Time:  Tuesday March 21 2013 11:54:34 EST Ventricular Rate:  70 PR Interval:  158 QRS Duration: 92 QT Interval:  364 QTC Calculation: 393 R Axis:   52 Text Interpretation:  Sinus rhythm RSR'  in V1 or V2, right VCD or RVH t wave in III no longer inverted Confirmed by Rosalynn Sergent  MD, Denver Harder ([id number]) on 03/21/2013 12:02:21 PM            MDM   1. Liver lesion   2. Splenic cyst   3. Abdominal pain    12:11 PM 43 y.o. female who presents with nausea and abdominal pressure which began this morning at approximately 8 AM. She notes symptoms have progressively worsened since then. She feels like the pain is primarily in her abdomen but does radiate up to her chest. She is afebrile and vital signs are unremarkable here. Will get screening labs, imaging, and pain control.   CT shows splenic cyst and enlarging lesion in the liver seen on previous CT. No acute findings. I notified the pt and recommended she f/u w/ her pcp for non-emergent MRI of the liver w/in the next month. Pt feeling better overall. Will provide pain control and reglan for nausea.  I have discussed the diagnosis/risks/treatment options with the patient and believe the pt to be eligible for discharge home to follow-up with pcp. We also discussed returning to the ED immediately if new or worsening sx occur. We discussed the sx which are most concerning (e.g., worsening pain, fever, vomiting, diarrhea) that necessitate immediate return. Any new prescriptions provided to the patient are listed below.  Discharge Medication List as of 03/21/2013  3:30 PM    START taking these medications   Details  metoCLOPramide (REGLAN) 10 MG tablet Take 1 tablet (10 mg total) by mouth every 8 (eight) hours as needed for nausea or vomiting (nausea/headache)., Starting 03/21/2013, Until Discontinued, Print    oxyCODONE-acetaminophen (PERCOCET) 5-325 MG per tablet Take 1 tablet by mouth every 6 (six) hours as needed for moderate pain., Starting 03/21/2013, Until Discontinued, Print           Blanchard Kelch, MD 03/22/13 [phone]

## 2013-03-21 NOTE — ED Notes (Signed)
Pt awaken this am and feels bloated in abd and feels like it is pressure, can't get relief firm abd had a bm this am, pt does not have gallbadder or appendix. No fers. Nausea no emesis.

## 2013-03-21 NOTE — Discharge Instructions (Signed)
Abdominal Pain, Women °Abdominal (stomach, pelvic, or belly) pain can be caused by many things. It is important to tell your doctor: °· The location of the pain. °· Does it come and go or is it present all the time? °· Are there things that start the pain (eating certain foods, exercise)? °· Are there other symptoms associated with the pain (fever, nausea, vomiting, diarrhea)? °All of this is helpful to know when trying to find the cause of the pain. °CAUSES  °· Stomach: virus or bacteria infection, or ulcer. °· Intestine: appendicitis (inflamed appendix), regional ileitis (Crohn's disease), ulcerative colitis (inflamed colon), irritable bowel syndrome, diverticulitis (inflamed diverticulum of the colon), or cancer of the stomach or intestine. °· Gallbladder disease or stones in the gallbladder. °· Kidney disease, kidney stones, or infection. °· Pancreas infection or cancer. °· Fibromyalgia (pain disorder). °· Diseases of the female organs: °· Uterus: fibroid (non-cancerous) tumors or infection. °· Fallopian tubes: infection or tubal pregnancy. °· Ovary: cysts or tumors. °· Pelvic adhesions (scar tissue). °· Endometriosis (uterus lining tissue growing in the pelvis and on the pelvic organs). °· Pelvic congestion syndrome (female organs filling up with blood just before the menstrual period). °· Pain with the menstrual period. °· Pain with ovulation (producing an egg). °· Pain with an IUD (intrauterine device, birth control) in the uterus. °· Cancer of the female organs. °· Functional pain (pain not caused by a disease, may improve without treatment). °· Psychological pain. °· Depression. °DIAGNOSIS  °Your doctor will decide the seriousness of your pain by doing an examination. °· Blood tests. °· X-rays. °· Ultrasound. °· CT scan (computed tomography, special type of X-ray). °· MRI (magnetic resonance imaging). °· Cultures, for infection. °· Barium enema (dye inserted in the large intestine, to better view it with  X-rays). °· Colonoscopy (looking in intestine with a lighted tube). °· Laparoscopy (minor surgery, looking in abdomen with a lighted tube). °· Major abdominal exploratory surgery (looking in abdomen with a large incision). °TREATMENT  °The treatment will depend on the cause of the pain.  °· Many cases can be observed and treated at home. °· Over-the-counter medicines recommended by your caregiver. °· Prescription medicine. °· Antibiotics, for infection. °· Birth control pills, for painful periods or for ovulation pain. °· Hormone treatment, for endometriosis. °· Nerve blocking injections. °· Physical therapy. °· Antidepressants. °· Counseling with a psychologist or psychiatrist. °· Minor or major surgery. °HOME CARE INSTRUCTIONS  °· Do not take laxatives, unless directed by your caregiver. °· Take over-the-counter pain medicine only if ordered by your caregiver. Do not take aspirin because it can cause an upset stomach or bleeding. °· Try a clear liquid diet (broth or water) as ordered by your caregiver. Slowly move to a bland diet, as tolerated, if the pain is related to the stomach or intestine. °· Have a thermometer and take your temperature several times a day, and record it. °· Bed rest and sleep, if it helps the pain. °· Avoid sexual intercourse, if it causes pain. °· Avoid stressful situations. °· Keep your follow-up appointments and tests, as your caregiver orders. °· If the pain does not go away with medicine or surgery, you may try: °· Acupuncture. °· Relaxation exercises (yoga, meditation). °· Group therapy. °· Counseling. °SEEK MEDICAL CARE IF:  °· You notice certain foods cause stomach pain. °· Your home care treatment is not helping your pain. °· You need stronger pain medicine. °· You want your IUD removed. °· You feel faint or   lightheaded. °· You develop nausea and vomiting. °· You develop a rash. °· You are having side effects or an allergy to your medicine. °SEEK IMMEDIATE MEDICAL CARE IF:  °· Your  pain does not go away or gets worse. °· You have a fever. °· Your pain is felt only in portions of the abdomen. The right side could possibly be appendicitis. The left lower portion of the abdomen could be colitis or diverticulitis. °· You are passing blood in your stools (bright red or black tarry stools, with or without vomiting). °· You have blood in your urine. °· You develop chills, with or without a fever. °· You pass out. °MAKE SURE YOU:  °· Understand these instructions. °· Will watch your condition. °· Will get help right away if you are not doing well or get worse. °Document Released: 12/21/2006 Document Revised: 05/18/2011 Document Reviewed: 01/10/2009 °[employer]® Patient Information ©2014 [employer]. ° °

## 2013-03-28 ENCOUNTER — Other Ambulatory Visit (HOSPITAL_COMMUNITY): Payer: Self-pay | Admitting: Internal Medicine

## 2013-03-28 DIAGNOSIS — K7689 Other specified diseases of liver: Secondary | ICD-10-CM

## 2013-04-04 ENCOUNTER — Other Ambulatory Visit (HOSPITAL_COMMUNITY): Payer: Self-pay | Admitting: Internal Medicine

## 2013-04-04 ENCOUNTER — Ambulatory Visit (HOSPITAL_COMMUNITY)
Admission: RE | Admit: 2013-04-04 | Discharge: 2013-04-04 | Disposition: A | Discharge status: Home / Self Care | Payer: 59 | Source: Ambulatory Visit | Attending: Internal Medicine | Admitting: Internal Medicine

## 2013-04-04 DIAGNOSIS — R109 Unspecified abdominal pain: Secondary | ICD-10-CM | POA: Insufficient documentation

## 2013-04-04 DIAGNOSIS — Z9089 Acquired absence of other organs: Secondary | ICD-10-CM | POA: Insufficient documentation

## 2013-04-04 DIAGNOSIS — K7689 Other specified diseases of liver: Secondary | ICD-10-CM

## 2013-04-04 DIAGNOSIS — D1803 Hemangioma of intra-abdominal structures: Secondary | ICD-10-CM | POA: Insufficient documentation

## 2013-04-04 DIAGNOSIS — D7389 Other diseases of spleen: Secondary | ICD-10-CM | POA: Insufficient documentation

## 2013-04-04 MED ORDER — GADOBENATE DIMEGLUMINE 529 MG/ML IV SOLN
20.0000 mL | Freq: Once | INTRAVENOUS | Status: AC | PRN
  Administered 2013-04-04: 20 mL via INTRAVENOUS

## 2013-04-10 ENCOUNTER — Emergency Department (HOSPITAL_COMMUNITY)
Admission: EM | Admit: 2013-04-10 | Discharge: 2013-04-10 | Disposition: A | Discharge status: Home / Self Care | Payer: 59 | Source: Home / Self Care | Attending: Family Medicine | Admitting: Family Medicine

## 2013-04-10 ENCOUNTER — Encounter (HOSPITAL_COMMUNITY): Payer: Self-pay | Admitting: Emergency Medicine

## 2013-04-10 ENCOUNTER — Other Ambulatory Visit (HOSPITAL_COMMUNITY)
Admission: RE | Admit: 2013-04-10 | Discharge: 2013-04-10 | Disposition: A | Discharge status: Home / Self Care | Payer: 59 | Source: Ambulatory Visit | Attending: Family Medicine | Admitting: Family Medicine

## 2013-04-10 DIAGNOSIS — N390 Urinary tract infection, site not specified: Secondary | ICD-10-CM

## 2013-04-10 DIAGNOSIS — Z113 Encounter for screening for infections with a predominantly sexual mode of transmission: Secondary | ICD-10-CM | POA: Insufficient documentation

## 2013-04-10 DIAGNOSIS — N760 Acute vaginitis: Secondary | ICD-10-CM | POA: Insufficient documentation

## 2013-04-10 LAB — POCT URINALYSIS DIP (DEVICE)
Bilirubin Urine: NEGATIVE
Glucose, UA: NEGATIVE mg/dL
Ketones, ur: NEGATIVE mg/dL
NITRITE: NEGATIVE
PROTEIN: 30 mg/dL — AB
SPECIFIC GRAVITY, URINE: 1.025 (ref 1.005–1.030)
UROBILINOGEN UA: 0.2 mg/dL (ref 0.0–1.0)
pH: 6.5 (ref 5.0–8.0)

## 2013-04-10 LAB — POCT PREGNANCY, URINE: Preg Test, Ur: NEGATIVE

## 2013-04-10 MED ORDER — FLUCONAZOLE 150 MG PO TABS: 150.0000 mg | ORAL_TABLET | Freq: Once | ORAL | Status: DC

## 2013-04-10 MED ORDER — METRONIDAZOLE 500 MG PO TABS: 500.0000 mg | ORAL_TABLET | Freq: Two times a day (BID) | ORAL | Status: DC

## 2013-04-10 MED ORDER — CEPHALEXIN 500 MG PO CAPS: 500.0000 mg | ORAL_CAPSULE | Freq: Two times a day (BID) | ORAL | Status: DC

## 2013-04-10 NOTE — Discharge Instructions (Signed)
Thank you for coming in today. Take metronidazole twice daily for one week for Bactrim vaginosis. Take Keflex twice daily for one week for UTI. If you get a yeast infection start taking fluconazole. We will call you with results if abnormal. If your belly pain worsens, or you have high fever, bad vomiting, blood in your stool or black tarry stool go to the Emergency Room.   Bacterial Vaginosis Bacterial vaginosis is a vaginal infection that occurs when the normal balance of bacteria in the vagina is disrupted. It results from an overgrowth of certain bacteria. This is the most common vaginal infection in women of childbearing age. Treatment is important to prevent complications, especially in pregnant women, as it can cause a premature delivery. CAUSES  Bacterial vaginosis is caused by an increase in harmful bacteria that are normally present in smaller amounts in the vagina. Several different kinds of bacteria can cause bacterial vaginosis. However, the reason that the condition develops is not fully understood. RISK FACTORS Certain activities or behaviors can put you at an increased risk of developing bacterial vaginosis, including:  Having a new sex partner or multiple sex partners.  Douching.  Using an intrauterine device (IUD) for contraception. Women do not get bacterial vaginosis from toilet seats, bedding, swimming pools, or contact with objects around them. SIGNS AND SYMPTOMS  Some women with bacterial vaginosis have no signs or symptoms. Common symptoms include:  Grey vaginal discharge.  A fishlike odor with discharge, especially after sexual intercourse.  Itching or burning of the vagina and vulva.  Burning or pain with urination. DIAGNOSIS  Your health care provider will take a medical history and examine the vagina for signs of bacterial vaginosis. A sample of vaginal fluid may be taken. Your health care provider will look at this sample under a microscope to check for  bacteria and abnormal cells. A vaginal pH test may also be done.  TREATMENT  Bacterial vaginosis may be treated with antibiotic medicines. These may be given in the form of a pill or a vaginal cream. A second round of antibiotics may be prescribed if the condition comes back after treatment.  HOME CARE INSTRUCTIONS   Only take over-the-counter or prescription medicines as directed by your health care provider.  If antibiotic medicine was prescribed, take it as directed. Make sure you finish it even if you start to feel better.  Do not have sex until treatment is completed.  Tell all sexual partners that you have a vaginal infection. They should see their health care provider and be treated if they have problems, such as a mild rash or itching.  Practice safe sex by using condoms and only having one sex partner. SEEK MEDICAL CARE IF:   Your symptoms are not improving after 3 days of treatment.  You have increased discharge or pain.  You have a fever. MAKE SURE YOU:   Understand these instructions.  Will watch your condition.  Will get help right away if you are not doing well or get worse. FOR MORE INFORMATION  Centers for Disease Control and Prevention, Division of STD Prevention: [employer] American Sexual Health Association (ASHA): www.ashastd.org  Document Released: 02/23/2005 Document Revised: 12/14/2012 Document Reviewed: 10/05/2012 [employer] Patient Information 2014 [place].

## 2013-04-10 NOTE — ED Notes (Signed)
C/o  Lower right sided back pain with urinary urgency and frequency.  On set last night.  Also having vaginal itching and burning.  States new sexual partner for the past several months.  Denies any abnormal discharge/pelvic or abdominal pain.

## 2013-04-10 NOTE — ED Provider Notes (Signed)
Margaret Fletcher is a 43 y.o. female who presents to Urgent Care today for urinary frequency urgency and dysuria. This is also associated with some vaginal itching and discharge. She notes that her urinary symptoms are consistent on prior episodes of urinary tract infection. She has not tried any medications for this and they are mildly bothersome. No significant abdominal pain fevers or chills. No nausea vomiting or diarrhea. Patient also notes a few days of vaginal irritation and discharge. This is consistent with bacterial vaginosis. She notes that she typically gets yeast infection following treatment with antibiotics as well.   Past Medical History  Diagnosis Date  . Alopecia   . Autoimmune disease   . Bacterial vaginosis   . Migraine headache    History  Substance Use Topics  . Smoking status: Never Smoker   . Smokeless tobacco: Never Used  . Alcohol Use: Yes     Comment: socially   ROS as above Medications: No current facility-administered medications for this encounter.   Current Outpatient Prescriptions  Medication Sig Dispense Refill  . Probiotic Product (PROBIOTIC DAILY PO) Take 1 tablet by mouth daily.      . cephALEXin (KEFLEX) 500 MG capsule Take 1 capsule (500 mg total) by mouth 2 (two) times daily.  14 capsule  0  . fluconazole (DIFLUCAN) 150 MG tablet Take 1 tablet (150 mg total) by mouth once.  1 tablet  1  . ibuprofen (ADVIL,MOTRIN) 800 MG tablet Take 800 mg by mouth every 8 (eight) hours as needed for pain.      Marland Kitchen metoCLOPramide (REGLAN) 10 MG tablet Take 1 tablet (10 mg total) by mouth every 8 (eight) hours as needed for nausea or vomiting (nausea/headache).  15 tablet  0  . metroNIDAZOLE (FLAGYL) 500 MG tablet Take 1 tablet (500 mg total) by mouth 2 (two) times daily.  14 tablet  0  . Multiple Vitamins-Minerals (MULTIVITAMIN WITH MINERALS) tablet Take 1 tablet by mouth daily.      Marland Kitchen oxyCODONE-acetaminophen (PERCOCET) 5-325 MG per tablet Take 1 tablet by mouth every  6 (six) hours as needed for moderate pain.  10 tablet  0  . SUMAtriptan (IMITREX) 25 MG tablet Take 25 mg by mouth every 2 (two) hours as needed. For migraines      . Vitamin D, Ergocalciferol, (DRISDOL) 50000 UNITS CAPS capsule Take 50,000 Units by mouth every 7 (seven) days.        Exam:  BP 133/96  Pulse 91  Temp(Src) 98.4 F (36.9 C) (Oral)  Resp 20  SpO2 100% Gen: Well NAD HEENT: EOMI,  MMM Lungs: Normal work of breathing. CTABL Heart: RRR no MRG Abd: NABS, Soft. NT, ND Exts: Brisk capillary refill, warm and well perfused.  GYN: Normal external genitalia. Vaginal canal with thin white discharge. Cervix is normal appearing. Nontender.  Results for orders placed during the hospital encounter of 04/10/13 (from the past 24 hour(s))  POCT URINALYSIS DIP (DEVICE)     Status: Abnormal   Collection Time    04/10/13 10:10 AM      Result Value Range   Glucose, UA NEGATIVE  NEGATIVE mg/dL   Bilirubin Urine NEGATIVE  NEGATIVE   Ketones, ur NEGATIVE  NEGATIVE mg/dL   Specific Gravity, Urine 1.025  1.005 - 1.030   Hgb urine dipstick MODERATE (*) NEGATIVE   pH 6.5  5.0 - 8.0   Protein, ur 30 (*) NEGATIVE mg/dL   Urobilinogen, UA 0.2  0.0 - 1.0 mg/dL   Nitrite  NEGATIVE  NEGATIVE   Leukocytes, UA MODERATE (*) NEGATIVE  POCT PREGNANCY, URINE     Status: None   Collection Time    04/10/13 10:10 AM      Result Value Range   Preg Test, Ur NEGATIVE  NEGATIVE   No results found.  Assessment and Plan: 43 y.o. female with UTI and vaginitis. Urine culture is pending as is vaginal cytology for gonorrhea, Chlamydia, BV, and yeast as well as Trichomonas. We'll treat empirically with Flagyl, Keflex, and fluconazole. Will call patient with results if abnormal.  Discussed warning signs or symptoms. Please see discharge instructions. Patient expresses understanding.    Gregor Hams, MD 04/10/13 1131

## 2013-04-10 NOTE — ED Notes (Signed)
Pt states that a week prior she was just getting over a yeast infection.

## 2013-04-12 LAB — URINE CULTURE
Colony Count: >=100000
SPECIAL REQUESTS: NORMAL

## 2013-04-12 NOTE — ED Notes (Signed)
GC/Chlamydia and Affirm all neg., Urine culture: >100,000 colonies Proteus Mirabilis.  Pt. adequately treated with Keflex. Margaret Fletcher 04/12/2013

## 2013-04-25 ENCOUNTER — Other Ambulatory Visit (HOSPITAL_COMMUNITY): Payer: Self-pay | Admitting: Gastroenterology

## 2013-04-25 DIAGNOSIS — R112 Nausea with vomiting, unspecified: Secondary | ICD-10-CM

## 2013-04-29 DIAGNOSIS — G43019 Migraine without aura, intractable, without status migrainosus: Secondary | ICD-10-CM | POA: Insufficient documentation

## 2013-05-03 ENCOUNTER — Ambulatory Visit (HOSPITAL_COMMUNITY)
Admission: RE | Admit: 2013-05-03 | Discharge: 2013-05-03 | Disposition: A | Discharge status: Home / Self Care | Payer: 59 | Source: Ambulatory Visit | Attending: Gastroenterology | Admitting: Gastroenterology

## 2013-05-03 DIAGNOSIS — R112 Nausea with vomiting, unspecified: Secondary | ICD-10-CM

## 2013-05-03 DIAGNOSIS — R1013 Epigastric pain: Secondary | ICD-10-CM | POA: Insufficient documentation

## 2013-05-03 DIAGNOSIS — R143 Flatulence: Secondary | ICD-10-CM

## 2013-05-03 DIAGNOSIS — R141 Gas pain: Secondary | ICD-10-CM | POA: Insufficient documentation

## 2013-05-03 DIAGNOSIS — R142 Eructation: Secondary | ICD-10-CM | POA: Insufficient documentation

## 2013-05-03 MED ORDER — TECHNETIUM TC 99M SULFUR COLLOID
2.0000 | Freq: Once | INTRAVENOUS | Status: AC | PRN
  Administered 2013-05-03: 2 via INTRAVENOUS

## 2013-05-22 ENCOUNTER — Ambulatory Visit: Payer: 59 | Admitting: *Deleted

## 2013-05-31 ENCOUNTER — Ambulatory Visit (HOSPITAL_COMMUNITY): Payer: 59

## 2013-08-01 ENCOUNTER — Other Ambulatory Visit: Payer: Self-pay

## 2013-08-01 DIAGNOSIS — Z1231 Encounter for screening mammogram for malignant neoplasm of breast: Secondary | ICD-10-CM

## 2013-08-01 DIAGNOSIS — Z803 Family history of malignant neoplasm of breast: Secondary | ICD-10-CM

## 2013-08-03 ENCOUNTER — Emergency Department (HOSPITAL_COMMUNITY)
Admission: EM | Admit: 2013-08-03 | Discharge: 2013-08-03 | Disposition: A | Discharge status: Home / Self Care | Payer: 59 | Attending: Emergency Medicine | Admitting: Emergency Medicine

## 2013-08-03 ENCOUNTER — Encounter (HOSPITAL_COMMUNITY): Payer: Self-pay | Admitting: Emergency Medicine

## 2013-08-03 DIAGNOSIS — B9789 Other viral agents as the cause of diseases classified elsewhere: Secondary | ICD-10-CM | POA: Insufficient documentation

## 2013-08-03 DIAGNOSIS — Z862 Personal history of diseases of the blood and blood-forming organs and certain disorders involving the immune mechanism: Secondary | ICD-10-CM | POA: Insufficient documentation

## 2013-08-03 DIAGNOSIS — R197 Diarrhea, unspecified: Secondary | ICD-10-CM

## 2013-08-03 DIAGNOSIS — Z79899 Other long term (current) drug therapy: Secondary | ICD-10-CM | POA: Insufficient documentation

## 2013-08-03 DIAGNOSIS — R1013 Epigastric pain: Secondary | ICD-10-CM | POA: Insufficient documentation

## 2013-08-03 DIAGNOSIS — Z8639 Personal history of other endocrine, nutritional and metabolic disease: Secondary | ICD-10-CM | POA: Insufficient documentation

## 2013-08-03 DIAGNOSIS — M791 Myalgia, unspecified site: Secondary | ICD-10-CM

## 2013-08-03 DIAGNOSIS — B349 Viral infection, unspecified: Secondary | ICD-10-CM

## 2013-08-03 DIAGNOSIS — R112 Nausea with vomiting, unspecified: Secondary | ICD-10-CM

## 2013-08-03 DIAGNOSIS — Z8742 Personal history of other diseases of the female genital tract: Secondary | ICD-10-CM | POA: Insufficient documentation

## 2013-08-03 DIAGNOSIS — G43909 Migraine, unspecified, not intractable, without status migrainosus: Secondary | ICD-10-CM | POA: Insufficient documentation

## 2013-08-03 DIAGNOSIS — Z872 Personal history of diseases of the skin and subcutaneous tissue: Secondary | ICD-10-CM | POA: Insufficient documentation

## 2013-08-03 LAB — URINALYSIS, ROUTINE W REFLEX MICROSCOPIC
Bilirubin Urine: NEGATIVE
GLUCOSE, UA: NEGATIVE mg/dL
KETONES UR: NEGATIVE mg/dL
Leukocytes, UA: NEGATIVE
Nitrite: NEGATIVE
Protein, ur: 30 mg/dL — AB
Specific Gravity, Urine: 1.026 (ref 1.005–1.030)
UROBILINOGEN UA: 0.2 mg/dL (ref 0.0–1.0)
pH: 5.5 (ref 5.0–8.0)

## 2013-08-03 LAB — COMPREHENSIVE METABOLIC PANEL
ALT: 19 U/L (ref 0–35)
AST: 24 U/L (ref 0–37)
Albumin: 3.3 g/dL — ABNORMAL LOW (ref 3.5–5.2)
Alkaline Phosphatase: 77 U/L (ref 39–117)
BILIRUBIN TOTAL: 0.3 mg/dL (ref 0.3–1.2)
BUN: 15 mg/dL (ref 6–23)
CO2: 22 meq/L (ref 19–32)
CREATININE: 1.15 mg/dL — AB (ref 0.50–1.10)
Calcium: 8.7 mg/dL (ref 8.4–10.5)
Chloride: 101 mEq/L (ref 96–112)
GFR calc Af Amer: 67 mL/min — ABNORMAL LOW (ref >90)
GFR calc non Af Amer: 57 mL/min — ABNORMAL LOW (ref >90)
Glucose, Bld: 110 mg/dL — ABNORMAL HIGH (ref 70–99)
POTASSIUM: 4.1 meq/L (ref 3.7–5.3)
Sodium: 138 mEq/L (ref 137–147)
Total Protein: 7.3 g/dL (ref 6.0–8.3)

## 2013-08-03 LAB — CBC WITH DIFFERENTIAL/PLATELET
BASOS ABS: 0 10*3/uL (ref 0.0–0.1)
BASOS PCT: 0 % (ref 0–1)
Eosinophils Absolute: 0.1 10*3/uL (ref 0.0–0.7)
Eosinophils Relative: 1 % (ref 0–5)
HEMATOCRIT: 38.9 % (ref 36.0–46.0)
Hemoglobin: 13.6 g/dL (ref 12.0–15.0)
LYMPHS PCT: 8 % — AB (ref 12–46)
Lymphs Abs: 1 10*3/uL (ref 0.7–4.0)
MCH: 31.1 pg (ref 26.0–34.0)
MCHC: 35 g/dL (ref 30.0–36.0)
MCV: 89 fL (ref 78.0–100.0)
MONO ABS: 0.6 10*3/uL (ref 0.1–1.0)
Monocytes Relative: 5 % (ref 3–12)
NEUTROS ABS: 10.1 10*3/uL — AB (ref 1.7–7.7)
Neutrophils Relative %: 86 % — ABNORMAL HIGH (ref 43–77)
Platelets: 286 10*3/uL (ref 150–400)
RBC: 4.37 MIL/uL (ref 3.87–5.11)
RDW: 13.8 % (ref 11.5–15.5)
WBC: 11.7 10*3/uL — AB (ref 4.0–10.5)

## 2013-08-03 LAB — URINE MICROSCOPIC-ADD ON

## 2013-08-03 MED ORDER — ONDANSETRON HCL 4 MG/2ML IJ SOLN
4.0000 mg | Freq: Once | INTRAMUSCULAR | Status: AC
  Administered 2013-08-03: 4 mg via INTRAVENOUS
  Filled 2013-08-03: qty 2

## 2013-08-03 MED ORDER — HYDROCODONE-ACETAMINOPHEN 5-325 MG PO TABS: 1.0000 | ORAL_TABLET | Freq: Four times a day (QID) | ORAL | Status: DC | PRN

## 2013-08-03 MED ORDER — ONDANSETRON 8 MG PO TBDP: 8.0000 mg | ORAL_TABLET | Freq: Three times a day (TID) | ORAL | Status: DC | PRN

## 2013-08-03 MED ORDER — DICYCLOMINE HCL 20 MG PO TABS: 20.0000 mg | ORAL_TABLET | Freq: Four times a day (QID) | ORAL | Status: DC | PRN

## 2013-08-03 MED ORDER — FENTANYL CITRATE 0.05 MG/ML IJ SOLN
50.0000 ug | Freq: Once | INTRAMUSCULAR | Status: DC
  Filled 2013-08-03: qty 2

## 2013-08-03 NOTE — ED Notes (Signed)
Patient declined pain medication at this time--would like to drive herself home

## 2013-08-03 NOTE — Discharge Instructions (Signed)
Diet for Diarrhea, Adult Frequent, runny stools (diarrhea) may be caused or worsened by food or drink. Diarrhea may be relieved by changing your diet. Since diarrhea can last up to 7 days, it is easy for you to lose too much fluid from the body and become dehydrated. Fluids that are lost need to be replaced. Along with a modified diet, make sure you drink enough fluids to keep your urine clear or pale yellow. DIET INSTRUCTIONS  Ensure adequate fluid intake (hydration): have 1 cup (8 oz) of fluid for each diarrhea episode. Avoid fluids that contain simple sugars or sports drinks, fruit juices, whole milk products, and sodas. Your urine should be clear or pale yellow if you are drinking enough fluids. Hydrate with an oral rehydration solution that you can purchase at pharmacies, retail stores, and online. You can prepare an oral rehydration solution at home by mixing the following ingredients together:    tsp table salt.   tsp baking soda.   tsp salt substitute containing potassium chloride.  1  tablespoons sugar.  1 L (34 oz) of water.  Certain foods and beverages may increase the speed at which food moves through the gastrointestinal (GI) tract. These foods and beverages should be avoided and include:  Caffeinated and alcoholic beverages.  High-fiber foods, such as raw fruits and vegetables, nuts, seeds, and whole grain breads and cereals.  Foods and beverages sweetened with sugar alcohols, such as xylitol, sorbitol, and mannitol.  Some foods may be well tolerated and may help thicken stool including:  Starchy foods, such as rice, toast, pasta, low-sugar cereal, oatmeal, grits, baked potatoes, crackers, and bagels.   Bananas.   Applesauce.  Add probiotic-rich foods to help increase healthy bacteria in the GI tract, such as yogurt and fermented milk products. RECOMMENDED FOODS AND BEVERAGES Starches Choose foods with less than 2 g of fiber per serving.  Recommended:  White,  [place], and pita breads, plain rolls, buns, bagels. Plain muffins, matzo. Soda, saltine, or graham crackers. Pretzels, melba toast, zwieback. Cooked cereals made with water: cornmeal, farina, cream cereals. Dry cereals: refined corn, wheat, rice. Potatoes prepared any way without skins, refined macaroni, spaghetti, noodles, refined rice.  Avoid:  Bread, rolls, or crackers made with whole wheat, multi-grains, rye, bran seeds, nuts, or coconut. Corn tortillas or taco shells. Cereals containing whole grains, multi-grains, bran, coconut, nuts, raisins. Cooked or dry oatmeal. Coarse wheat cereals, granola. Cereals advertised as "high-fiber." Potato skins. Whole grain pasta, wild or brown rice. Popcorn. Sweet potatoes, yams. Sweet rolls, doughnuts, waffles, pancakes, sweet breads. Vegetables  Recommended: Strained tomato and vegetable juices. Most well-cooked and canned vegetables without seeds. Fresh: Tender lettuce, cucumber without the skin, cabbage, spinach, bean sprouts.  Avoid: Fresh, cooked, or canned: Artichokes, baked beans, beet greens, broccoli, Brussels sprouts, corn, kale, legumes, peas, sweet potatoes. Cooked: Green or red cabbage, spinach. Avoid large servings of any vegetables because vegetables shrink when cooked, and they contain more fiber per serving than fresh vegetables. Fruit  Recommended: Cooked or canned: Apricots, applesauce, cantaloupe, cherries, fruit cocktail, grapefruit, grapes, kiwi, mandarin oranges, peaches, pears, plums, watermelon. Fresh: Apples without skin, ripe banana, grapes, cantaloupe, cherries, grapefruit, peaches, oranges, plums. Keep servings limited to  cup or 1 piece.  Avoid: Fresh: Apples with skin, apricots, mangoes, pears, raspberries, strawberries. Prune juice, stewed or dried prunes. Dried fruits, raisins, dates. Large servings of all fresh fruits. Protein  Recommended: Ground or well-cooked tender beef, ham, veal, lamb, pork, or poultry. Eggs. Fish,  oysters, shrimp,  lobster, other seafoods. Liver, organ meats.  Avoid: Tough, fibrous meats with gristle. Peanut butter, smooth or chunky. Cheese, nuts, seeds, legumes, dried peas, beans, lentils. Dairy  Recommended: Yogurt, lactose-free milk, kefir, drinkable yogurt, buttermilk, soy milk, or plain hard cheese.  Avoid: Milk, chocolate milk, beverages made with milk, such as milkshakes. Soups  Recommended: Bouillon, broth, or soups made from allowed foods. Any strained soup.  Avoid: Soups made from vegetables that are not allowed, cream or milk-based soups. Desserts and Sweets  Recommended: Sugar-free gelatin, sugar-free frozen ice pops made without sugar alcohol.  Avoid: Plain cakes and cookies, pie made with fruit, pudding, custard, cream pie. Gelatin, fruit, ice, sherbet, frozen ice pops. Ice cream, ice milk without nuts. Plain hard candy, honey, jelly, molasses, syrup, sugar, chocolate syrup, gumdrops, marshmallows. Fats and Oils  Recommended: Limit fats to less than 8 tsp per day.  Avoid: Seeds, nuts, olives, avocados. Margarine, butter, cream, mayonnaise, salad oils, plain salad dressings. Plain gravy, crisp bacon without rind. Beverages  Recommended: Water, decaffeinated teas, oral rehydration solutions, sugar-free beverages not sweetened with sugar alcohols.  Avoid: Fruit juices, caffeinated beverages (coffee, tea, soda), alcohol, sports drinks, or lemon-lime soda. Condiments  Recommended: Ketchup, mustard, horseradish, vinegar, cocoa powder. Spices in moderation: allspice, basil, bay leaves, celery powder or leaves, cinnamon, cumin powder, curry powder, ginger, mace, marjoram, onion or garlic powder, oregano, paprika, parsley flakes, ground pepper, rosemary, sage, savory, tarragon, thyme, turmeric.  Avoid: Coconut, honey. Document Released: 05/16/2003 Document Revised: 11/18/2011 Document Reviewed: 07/10/2011 [facility] Patient Information 2014 [place].  Diarrhea Diarrhea is frequent loose and watery bowel movements. It can cause you to feel weak and dehydrated. Dehydration can cause you to become tired and thirsty, have a dry mouth, and have decreased urination that often is dark yellow. Diarrhea is a sign of another problem, most often an infection that will not last long. In most cases, diarrhea typically lasts 2 3 days. However, it can last longer if it is a sign of something more serious. It is important to treat your diarrhea as directed by your caregive to lessen or prevent future episodes of diarrhea. CAUSES  Some common causes include:  Gastrointestinal infections caused by viruses, bacteria, or parasites.  Food poisoning or food allergies.  Certain medicines, such as antibiotics, chemotherapy, and laxatives.  Artificial sweeteners and fructose.  Digestive disorders. HOME CARE INSTRUCTIONS  Ensure adequate fluid intake (hydration): have 1 cup (8 oz) of fluid for each diarrhea episode. Avoid fluids that contain simple sugars or sports drinks, fruit juices, whole milk products, and sodas. Your urine should be clear or pale yellow if you are drinking enough fluids. Hydrate with an oral rehydration solution that you can purchase at pharmacies, retail stores, and online. You can prepare an oral rehydration solution at home by mixing the following ingredients together:    tsp table salt.   tsp baking soda.   tsp salt substitute containing potassium chloride.  1  tablespoons sugar.  1 L (34 oz) of water.  Certain foods and beverages may increase the speed at which food moves through the gastrointestinal (GI) tract. These foods and beverages should be avoided and include:  Caffeinated and alcoholic beverages.  High-fiber foods, such as raw fruits and vegetables, nuts, seeds, and whole grain breads and cereals.  Foods and beverages sweetened with sugar alcohols, such as xylitol, sorbitol, and mannitol.  Some foods may be  well tolerated and may help thicken stool including:  Starchy foods, such as rice, toast,  pasta, low-sugar cereal, oatmeal, grits, baked potatoes, crackers, and bagels.  Bananas.  Applesauce.  Add probiotic-rich foods to help increase healthy bacteria in the GI tract, such as yogurt and fermented milk products.  Wash your hands well after each diarrhea episode.  Only take over-the-counter or prescription medicines as directed by your caregiver.  Take a warm bath to relieve any burning or pain from frequent diarrhea episodes. SEEK IMMEDIATE MEDICAL CARE IF:   You are unable to keep fluids down.  You have persistent vomiting.  You have blood in your stool, or your stools are black and tarry.  You do not urinate in 6 8 hours, or there is only a small amount of very dark urine.  You have abdominal pain that increases or localizes.  You have weakness, dizziness, confusion, or lightheadedness.  You have a severe headache.  Your diarrhea gets worse or does not get better.  You have a fever or persistent symptoms for more than 2 3 days.  You have a fever and your symptoms suddenly get worse. MAKE SURE YOU:   Understand these instructions.  Will watch your condition.  Will get help right away if you are not doing well or get worse. Document Released: 02/13/2002 Document Revised: 02/10/2012 Document Reviewed: 11/01/2011 [facility] Patient Information 2014 [place], [place].  Muscle Cramps and Spasms Muscle cramps and spasms occur when a muscle or muscles tighten and you have no control over this tightening (involuntary muscle contraction). They are a common problem and can develop in any muscle. The most common place is in the calf muscles of the leg. Both muscle cramps and muscle spasms are involuntary muscle contractions, but they also have differences:   Muscle cramps are sporadic and painful. They may last a few seconds to a quarter of an hour. Muscle cramps are often more  forceful and last longer than muscle spasms.  Muscle spasms may or may not be painful. They may also last just a few seconds or much longer. CAUSES  It is uncommon for cramps or spasms to be due to a serious underlying problem. In many cases, the cause of cramps or spasms is unknown. Some common causes are:   Overexertion.   Overuse from repetitive motions (doing the same thing over and over).   Remaining in a certain position for a long period of time.   Improper preparation, form, or technique while performing a sport or activity.   Dehydration.   Injury.   Side effects of some medicines.   Abnormally low levels of the salts and ions in your blood (electrolytes), especially potassium and calcium. This could happen if you are taking water pills (diuretics) or you are pregnant.  Some underlying medical problems can make it more likely to develop cramps or spasms. These include, but are not limited to:   Diabetes.   Parkinson disease.   Hormone disorders, such as thyroid problems.   Alcohol abuse.   Diseases specific to muscles, joints, and bones.   Blood vessel disease where not enough blood is getting to the muscles.  HOME CARE INSTRUCTIONS   Stay well hydrated. Drink enough water and fluids to keep your urine clear or pale yellow.  It may be helpful to massage, stretch, and relax the affected muscle.  For tight or tense muscles, use a warm towel, heating pad, or hot shower water directed to the affected area.  If you are sore or have pain after a cramp or spasm, applying ice to the affected area  may relieve discomfort.  Put ice in a plastic bag.  Place a towel between your skin and the bag.  Leave the ice on for 15-20 minutes, 03-04 times a day.  Medicines used to treat a known cause of cramps or spasms may help reduce their frequency or severity. Only take over-the-counter or prescription medicines as directed by your caregiver. SEEK MEDICAL CARE  IF:  Your cramps or spasms get more severe, more frequent, or do not improve over time.  MAKE SURE YOU:   Understand these instructions.  Will watch your condition.  Will get help right away if you are not doing well or get worse. Document Released: 08/15/2001 Document Revised: 06/20/2012 Document Reviewed: 02/10/2012 [facility] Patient Information 2014 [place], [place].  Nausea and Vomiting Nausea means you feel sick to your stomach. Throwing up (vomiting) is a reflex where stomach contents come out of your mouth. HOME CARE   Take medicine as told by your doctor.  Do not force yourself to eat. However, you do need to drink fluids.  If you feel like eating, eat a normal diet as told by your doctor.  Eat rice, wheat, potatoes, bread, lean meats, yogurt, fruits, and vegetables.  Avoid high-fat foods.  Drink enough fluids to keep your pee (urine) clear or pale yellow.  Ask your doctor how to replace body fluid losses (rehydrate). Signs of body fluid loss (dehydration) include:  Feeling very thirsty.  Dry lips and mouth.  Feeling dizzy.  Dark pee.  Peeing less than normal.  Feeling confused.  Fast breathing or heart rate. GET HELP RIGHT AWAY IF:   You have blood in your throw up.  You have black or bloody poop (stool).  You have a bad headache or stiff neck.  You feel confused.  You have bad belly (abdominal) pain.  You have chest pain or trouble breathing.  You do not pee at least once every 8 hours.  You have cold, clammy skin.  You keep throwing up after 24 to 48 hours.  You have a fever. MAKE SURE YOU:   Understand these instructions.  Will watch your condition.  Will get help right away if you are not doing well or get worse. Document Released: 08/12/2007 Document Revised: 05/18/2011 Document Reviewed: 07/25/2010 [facility] Patient Information 2014 [place], [place].  Viral Infections A viral infection can be caused by different types of  viruses.Most viral infections are not serious and resolve on their own. However, some infections may cause severe symptoms and may lead to further complications. SYMPTOMS Viruses can frequently cause:  Minor sore throat.  Aches and pains.  Headaches.  Runny nose.  Different types of rashes.  Watery eyes.  Tiredness.  Cough.  Loss of appetite.  Gastrointestinal infections, resulting in nausea, vomiting, and diarrhea. These symptoms do not respond to antibiotics because the infection is not caused by bacteria. However, you might catch a bacterial infection following the viral infection. This is sometimes called a "superinfection." Symptoms of such a bacterial infection may include:  Worsening sore throat with pus and difficulty swallowing.  Swollen neck glands.  Chills and a high or persistent fever.  Severe headache.  Tenderness over the sinuses.  Persistent overall ill feeling (malaise), muscle aches, and tiredness (fatigue).  Persistent cough.  Yellow, green, or brown mucus production with coughing. HOME CARE INSTRUCTIONS   Only take over-the-counter or prescription medicines for pain, discomfort, diarrhea, or fever as directed by your caregiver.  Drink enough water and fluids to keep your urine clear  or pale yellow. Sports drinks can provide valuable electrolytes, sugars, and hydration.  Get plenty of rest and maintain proper nutrition. Soups and broths with crackers or rice are fine. SEEK IMMEDIATE MEDICAL CARE IF:   You have severe headaches, shortness of breath, chest pain, neck pain, or an unusual rash.  You have uncontrolled vomiting, diarrhea, or you are unable to keep down fluids.  You or your child has an oral temperature above 102 F (38.9 C), not controlled by medicine.  Your baby is older than 3 months with a rectal temperature of 102 F (38.9 C) or higher.  Your baby is 5 months old or younger with a rectal temperature of 100.4 F (38 C) or  higher. MAKE SURE YOU:   Understand these instructions.  Will watch your condition.  Will get help right away if you are not doing well or get worse. Document Released: 12/03/2004 Document Revised: 05/18/2011 Document Reviewed: 06/30/2010 [facility] Patient Information 2014 [place], [place].

## 2013-08-03 NOTE — ED Notes (Signed)
Pt reports feeling very tired after exercising today and began having diarrhea that started around 6pm yesterday. Pt states she had 5-7 loose stools took immodium with no relief. Pt report n/vx2. Pt alert and ambulatory to room.

## 2013-08-03 NOTE — ED Notes (Signed)
Patient reports "cramping" to both legs Dr. Sharol Given made aware

## 2013-08-03 NOTE — ED Provider Notes (Signed)
CSN: [id number]     Arrival date & time 08/03/13  0256 History   First MD Initiated Contact with Patient 08/03/13 [phone]     Chief Complaint  Patient presents with  . Emesis  . Diarrhea     (Consider location/radiation/quality/duration/timing/severity/associated sxs/prior Treatment) HPI 43 year old female presents to emergency room with complaint of generalized fatigue, nausea vomiting and diarrhea.  Symptoms started around 6 PM yesterday.  Patient reports she exercises as usual yesterday no new exertion or program done.  Patient is a Designer, multimedia in the emergency department.  She denies any known sick contacts.  No unusual foods.  She reports some upper abdominal discomfort and cramping.  Patient is status post cholecystectomy and appendectomy. Past Medical History  Diagnosis Date  . Alopecia   . Autoimmune disease   . Bacterial vaginosis   . Migraine headache    Past Surgical History  Procedure Laterality Date  . Cesarean section    . Cholecystectomy    . Appendectomy    . Cesarean section    . Wisdom tooth extraction    . Tubal ligation    . Dilitation & currettage/hystroscopy with novasure ablation N/A 07/19/2012    Procedure: HYSTEROSCOPY WITH NOVASURE ABLATION;  Surgeon: Farrel Gobble. Harrington Challenger, MD;  Location: [facility];  Service: Gynecology;  Laterality: N/A;   History reviewed. No pertinent family history. History  Substance Use Topics  . Smoking status: Never Smoker   . Smokeless tobacco: Never Used  . Alcohol Use: Yes     Comment: socially   OB History   Grav Para Term Preterm Abortions TAB SAB Ect Mult Living                 Review of Systems  See History of Present Illness; otherwise all other systems are reviewed and negative   Allergies  Sulfa antibiotics  Home Medications   Prior to Admission medications   Medication Sig Start Date End Date Taking? Authorizing Provider  ibuprofen (ADVIL,MOTRIN) 800 MG tablet Take 800 mg by mouth every 8 (eight) hours as needed for pain.    Yes Historical Provider, MD  loperamide (IMODIUM A-D) 2 MG tablet Take 2 mg by mouth 4 (four) times daily as needed for diarrhea or loose stools.   Yes Historical Provider, MD  SUMAtriptan (IMITREX) 25 MG tablet Take 25 mg by mouth every 2 (two) hours as needed. For migraines    Historical Provider, MD   BP 132/91  Pulse 105  Temp(Src) 99.9 F (37.7 C) (Oral)  Resp 20  SpO2 95% Physical Exam  Nursing note and vitals reviewed. Constitutional: She is oriented to person, place, and time. She appears well-developed and well-nourished.  HENT:  Head: Normocephalic and atraumatic.  Nose: Nose normal.  Mouth/Throat: Oropharynx is clear and moist.  Eyes: Conjunctivae and EOM are normal. Pupils are equal, round, and reactive to light.  Neck: Normal range of motion. Neck supple. No JVD present. No tracheal deviation present. No thyromegaly present.  Cardiovascular: Normal rate, regular rhythm, normal heart sounds and intact distal pulses.  Exam reveals no gallop and no friction rub.   No murmur heard. Pulmonary/Chest: Effort normal and breath sounds normal. No stridor. No respiratory distress. She has no wheezes. She has no rales. She exhibits no tenderness.  Abdominal: Soft. She exhibits no distension and no mass. There is tenderness (mild epigastric tenderness with palpation). There is no rebound and no guarding.  Hyperactive bowel sounds  Musculoskeletal: Normal range of motion. She exhibits no edema  and no tenderness.  Lymphadenopathy:    She has no cervical adenopathy.  Neurological: She is alert and oriented to person, place, and time. She exhibits normal muscle tone. Coordination normal.  Skin: Skin is warm and dry. No rash noted. No erythema. No pallor.  Psychiatric: She has a normal mood and affect. Her behavior is normal. Judgment and thought content normal.    ED Course  Procedures (including critical care time) Labs Review Labs Reviewed  CBC WITH DIFFERENTIAL - Abnormal;  Notable for the following:    WBC 11.7 (*)    Neutrophils Relative % 86 (*)    Neutro Abs 10.1 (*)    Lymphocytes Relative 8 (*)    All other components within normal limits  COMPREHENSIVE METABOLIC PANEL - Abnormal; Notable for the following:    Glucose, Bld 110 (*)    Creatinine, Ser 1.15 (*)    Albumin 3.3 (*)    GFR calc non Af Amer 57 (*)    GFR calc Af Amer 67 (*)    All other components within normal limits  URINALYSIS, ROUTINE W REFLEX MICROSCOPIC - Abnormal; Notable for the following:    APPearance TURBID (*)    Hgb urine dipstick MODERATE (*)    Protein, ur 30 (*)    All other components within normal limits  URINE MICROSCOPIC-ADD ON - Abnormal; Notable for the following:    Squamous Epithelial / LPF MANY (*)    Bacteria, UA MANY (*)    All other components within normal limits    Imaging Review No results found.   EKG Interpretation None      MDM   Final diagnoses:  Nausea vomiting and diarrhea  Myalgia  Viral syndrome    43 year old female with one-day history of nausea vomiting diarrhea.  Labs unremarkable.  Patient feeling better after medication, but is having some leg cramping.  She does not wish treatment at this time.  Plan to discharge home with nausea medication, out of work for 2 days.    Kalman Drape, MD 08/03/13 [phone]

## 2013-08-03 NOTE — ED Notes (Signed)
Patient informed of order for Fentanyl Patient would like to speak with MD r/t medication prior to administration  MD made aware Dr. Sharol Given at bedside

## 2013-08-04 ENCOUNTER — Other Ambulatory Visit (HOSPITAL_COMMUNITY): Payer: Self-pay | Admitting: Rheumatology

## 2013-08-04 DIAGNOSIS — M47818 Spondylosis without myelopathy or radiculopathy, sacral and sacrococcygeal region: Secondary | ICD-10-CM

## 2013-08-14 ENCOUNTER — Ambulatory Visit (HOSPITAL_COMMUNITY): Admission: RE | Admit: 2013-08-14 | Payer: 59 | Source: Ambulatory Visit

## 2013-08-15 ENCOUNTER — Ambulatory Visit: Payer: 59 | Admitting: Internal Medicine

## 2013-08-15 DIAGNOSIS — Z0289 Encounter for other administrative examinations: Secondary | ICD-10-CM

## 2013-08-18 ENCOUNTER — Ambulatory Visit
Admission: RE | Admit: 2013-08-18 | Discharge: 2013-08-18 | Disposition: A | Discharge status: Home / Self Care | Payer: 59 | Source: Ambulatory Visit

## 2013-08-18 DIAGNOSIS — Z1231 Encounter for screening mammogram for malignant neoplasm of breast: Secondary | ICD-10-CM

## 2013-08-18 DIAGNOSIS — Z803 Family history of malignant neoplasm of breast: Secondary | ICD-10-CM

## 2013-09-11 ENCOUNTER — Ambulatory Visit (HOSPITAL_COMMUNITY): Admission: RE | Admit: 2013-09-11 | Payer: 59 | Source: Ambulatory Visit

## 2013-10-07 ENCOUNTER — Encounter (HOSPITAL_COMMUNITY): Payer: Self-pay | Admitting: Emergency Medicine

## 2013-10-07 ENCOUNTER — Emergency Department (HOSPITAL_COMMUNITY)
Admission: EM | Admit: 2013-10-07 | Discharge: 2013-10-08 | Disposition: A | Discharge status: Home / Self Care | Payer: 59 | Attending: Emergency Medicine | Admitting: Emergency Medicine

## 2013-10-07 ENCOUNTER — Emergency Department (INDEPENDENT_AMBULATORY_CARE_PROVIDER_SITE_OTHER)
Admission: EM | Admit: 2013-10-07 | Discharge: 2013-10-07 | Disposition: A | Discharge status: Nursing Home (Includes ICF) | Payer: 59 | Source: Home / Self Care | Attending: Emergency Medicine | Admitting: Emergency Medicine

## 2013-10-07 DIAGNOSIS — R102 Pelvic and perineal pain: Secondary | ICD-10-CM

## 2013-10-07 DIAGNOSIS — N949 Unspecified condition associated with female genital organs and menstrual cycle: Secondary | ICD-10-CM | POA: Insufficient documentation

## 2013-10-07 DIAGNOSIS — Z9089 Acquired absence of other organs: Secondary | ICD-10-CM | POA: Insufficient documentation

## 2013-10-07 DIAGNOSIS — R1031 Right lower quadrant pain: Secondary | ICD-10-CM | POA: Insufficient documentation

## 2013-10-07 DIAGNOSIS — Z872 Personal history of diseases of the skin and subcutaneous tissue: Secondary | ICD-10-CM | POA: Insufficient documentation

## 2013-10-07 DIAGNOSIS — Z791 Long term (current) use of non-steroidal anti-inflammatories (NSAID): Secondary | ICD-10-CM | POA: Insufficient documentation

## 2013-10-07 DIAGNOSIS — Z79899 Other long term (current) drug therapy: Secondary | ICD-10-CM | POA: Insufficient documentation

## 2013-10-07 DIAGNOSIS — Z87440 Personal history of urinary (tract) infections: Secondary | ICD-10-CM | POA: Insufficient documentation

## 2013-10-07 HISTORY — DX: Cyst of spleen: D73.4

## 2013-10-07 HISTORY — DX: Urinary tract infection, site not specified: N39.0

## 2013-10-07 LAB — POCT URINALYSIS DIP (DEVICE)
Bilirubin Urine: NEGATIVE
Glucose, UA: NEGATIVE mg/dL
Ketones, ur: NEGATIVE mg/dL
LEUKOCYTES UA: NEGATIVE
Nitrite: NEGATIVE
PROTEIN: NEGATIVE mg/dL
Specific Gravity, Urine: 1.02 (ref 1.005–1.030)
Urobilinogen, UA: 0.2 mg/dL (ref 0.0–1.0)
pH: 5.5 (ref 5.0–8.0)

## 2013-10-07 LAB — CBC WITH DIFFERENTIAL/PLATELET
BASOS PCT: 1 % (ref 0–1)
Basophils Absolute: 0.1 10*3/uL (ref 0.0–0.1)
EOS PCT: 2 % (ref 0–5)
Eosinophils Absolute: 0.2 10*3/uL (ref 0.0–0.7)
HEMATOCRIT: 39.3 % (ref 36.0–46.0)
HEMOGLOBIN: 12.8 g/dL (ref 12.0–15.0)
Lymphocytes Relative: 24 % (ref 12–46)
Lymphs Abs: 2.3 10*3/uL (ref 0.7–4.0)
MCH: 30.3 pg (ref 26.0–34.0)
MCHC: 32.6 g/dL (ref 30.0–36.0)
MCV: 93.1 fL (ref 78.0–100.0)
MONO ABS: 0.6 10*3/uL (ref 0.1–1.0)
MONOS PCT: 6 % (ref 3–12)
NEUTROS ABS: 6.6 10*3/uL (ref 1.7–7.7)
Neutrophils Relative %: 67 % (ref 43–77)
Platelets: 312 10*3/uL (ref 150–400)
RBC: 4.22 MIL/uL (ref 3.87–5.11)
RDW: 14.1 % (ref 11.5–15.5)
WBC: 9.7 10*3/uL (ref 4.0–10.5)

## 2013-10-07 LAB — PREGNANCY, URINE: Preg Test, Ur: NEGATIVE

## 2013-10-07 LAB — COMPREHENSIVE METABOLIC PANEL
ALBUMIN: 3.1 g/dL — AB (ref 3.5–5.2)
ALT: 28 U/L (ref 0–35)
AST: 23 U/L (ref 0–37)
Alkaline Phosphatase: 67 U/L (ref 39–117)
Anion gap: 12 (ref 5–15)
BILIRUBIN TOTAL: 0.3 mg/dL (ref 0.3–1.2)
BUN: 18 mg/dL (ref 6–23)
CALCIUM: 8.3 mg/dL — AB (ref 8.4–10.5)
CO2: 24 meq/L (ref 19–32)
CREATININE: 1.2 mg/dL — AB (ref 0.50–1.10)
Chloride: 103 mEq/L (ref 96–112)
GFR calc Af Amer: 63 mL/min — ABNORMAL LOW (ref >90)
GFR calc non Af Amer: 55 mL/min — ABNORMAL LOW (ref >90)
Glucose, Bld: 94 mg/dL (ref 70–99)
Potassium: 4.3 mEq/L (ref 3.7–5.3)
Sodium: 139 mEq/L (ref 137–147)
Total Protein: 6.8 g/dL (ref 6.0–8.3)

## 2013-10-07 LAB — URINALYSIS, ROUTINE W REFLEX MICROSCOPIC
BILIRUBIN URINE: NEGATIVE
Glucose, UA: NEGATIVE mg/dL
KETONES UR: NEGATIVE mg/dL
Leukocytes, UA: NEGATIVE
Nitrite: NEGATIVE
Protein, ur: NEGATIVE mg/dL
Specific Gravity, Urine: 1.02 (ref 1.005–1.030)
Urobilinogen, UA: 0.2 mg/dL (ref 0.0–1.0)
pH: 5 (ref 5.0–8.0)

## 2013-10-07 LAB — URINE MICROSCOPIC-ADD ON

## 2013-10-07 LAB — LIPASE, BLOOD: LIPASE: 29 U/L (ref 11–59)

## 2013-10-07 MED ORDER — ONDANSETRON HCL 4 MG/2ML IJ SOLN
INTRAMUSCULAR | Status: AC
  Filled 2013-10-07: qty 2

## 2013-10-07 MED ORDER — HYDROMORPHONE HCL PF 1 MG/ML IJ SOLN
1.0000 mg | Freq: Once | INTRAMUSCULAR | Status: AC
  Administered 2013-10-07: 1 mg via INTRAVENOUS
  Filled 2013-10-07: qty 1

## 2013-10-07 MED ORDER — HYDROMORPHONE HCL 1 MG/ML IJ SOLN
1.0000 mg | Freq: Once | INTRAMUSCULAR | Status: AC
  Administered 2013-10-07: 1 mg via INTRAVENOUS

## 2013-10-07 MED ORDER — HYDROMORPHONE HCL PF 1 MG/ML IJ SOLN
INTRAMUSCULAR | Status: AC
  Filled 2013-10-07: qty 1

## 2013-10-07 MED ORDER — IOHEXOL 300 MG/ML  SOLN
25.0000 mL | Freq: Once | INTRAMUSCULAR | Status: AC | PRN
Start: 2013-10-07 — End: 2013-10-07
  Administered 2013-10-07: 25 mL via ORAL

## 2013-10-07 MED ORDER — ONDANSETRON HCL 4 MG/2ML IJ SOLN
4.0000 mg | Freq: Once | INTRAMUSCULAR | Status: AC
  Administered 2013-10-07: 4 mg via INTRAVENOUS

## 2013-10-07 MED ORDER — SODIUM CHLORIDE 0.9 % IV SOLN
Freq: Once | INTRAVENOUS | Status: AC
  Administered 2013-10-07: 19:00:00 via INTRAVENOUS

## 2013-10-07 MED ORDER — FENTANYL CITRATE 0.05 MG/ML IJ SOLN
100.0000 ug | Freq: Once | INTRAMUSCULAR | Status: AC
  Administered 2013-10-07: 100 ug via INTRAVENOUS
  Filled 2013-10-07: qty 2

## 2013-10-07 MED ORDER — SODIUM CHLORIDE 0.9 % IV BOLUS (SEPSIS)
500.0000 mL | Freq: Once | INTRAVENOUS | Status: AC
  Administered 2013-10-07: 500 mL via INTRAVENOUS

## 2013-10-07 MED ORDER — ONDANSETRON HCL 4 MG/2ML IJ SOLN: 4.0000 mg | Freq: Once | INTRAMUSCULAR | Status: DC

## 2013-10-07 MED ORDER — SODIUM CHLORIDE 0.9 % IV SOLN
INTRAVENOUS | Status: DC
  Administered 2013-10-07: 23:00:00 via INTRAVENOUS

## 2013-10-07 MED ORDER — ONDANSETRON HCL 4 MG/2ML IJ SOLN
4.0000 mg | Freq: Once | INTRAMUSCULAR | Status: AC
  Administered 2013-10-07: 4 mg via INTRAVENOUS
  Filled 2013-10-07: qty 2

## 2013-10-07 NOTE — ED Notes (Signed)
Pt c/o sharp lower rt side abd/pelvic pain. Pt states she no longer has an appendix or gallbladder. Pt reports pain started yesterday while she was at work. Pt states she had a cervical ablation so she does not normally have a period. Pt denies any vaginal discharge or urinary sx. Pt rates pain 8/10.

## 2013-10-07 NOTE — ED Provider Notes (Signed)
CSN: [id number]     Arrival date & time 10/07/13  7829 History   None    Chief Complaint  Patient presents with  . Abdominal Pain   (Consider location/radiation/quality/duration/timing/severity/associated sxs/prior Treatment) HPI Comments: Is S/P remote uterine ablation, appendectomy and cholecystectomy.  Patient is a 43 y.o. female presenting with abdominal pain.  Abdominal Pain Pain location:  RLQ Pain quality: sharp   Pain radiates to:  Back and R flank Pain severity:  Severe Onset quality:  Gradual Duration:  1 day Timing:  Constant Progression:  Worsening Chronicity:  New Relieved by:  Nothing Ineffective treatments:  NSAIDs (no relief with percocet taken at home) Associated symptoms: nausea   Associated symptoms: no anorexia, no belching, no chest pain, no chills, no constipation, no cough, no diarrhea, no dysuria, no fatigue, no fever, no flatus, no hematemesis, no hematochezia, no hematuria, no melena, no shortness of breath, no sore throat, no vaginal bleeding, no vaginal discharge and no vomiting     Past Medical History  Diagnosis Date  . Alopecia   . Autoimmune disease   . Bacterial vaginosis   . Migraine headache   . Frequent UTI   . Cyst of spleen    Past Surgical History  Procedure Laterality Date  . Cesarean section    . Cholecystectomy    . Appendectomy    . Cesarean section    . Wisdom tooth extraction    . Tubal ligation    . Dilitation & currettage/hystroscopy with novasure ablation N/A 07/19/2012    Procedure: HYSTEROSCOPY WITH NOVASURE ABLATION;  Surgeon: Farrel Gobble. Harrington Challenger, MD;  Location: [facility];  Service: Gynecology;  Laterality: N/A;  . Endometrial ablation     No family history on file. History  Substance Use Topics  . Smoking status: Never Smoker   . Smokeless tobacco: Never Used  . Alcohol Use: Yes     Comment: socially   OB History   Grav Para Term Preterm Abortions TAB SAB Ect Mult Living                 Review of Systems   Constitutional: Negative for fever, chills and fatigue.  HENT: Negative.  Negative for sore throat.   Eyes: Negative.   Respiratory: Negative for cough, chest tightness and shortness of breath.   Cardiovascular: Negative for chest pain.  Gastrointestinal: Positive for nausea and abdominal pain. Negative for vomiting, diarrhea, constipation, blood in stool, melena, hematochezia, anorexia, flatus and hematemesis.  Genitourinary: Positive for flank pain and pelvic pain. Negative for dysuria, frequency, hematuria, vaginal bleeding, vaginal discharge, difficulty urinating and vaginal pain.  Musculoskeletal: Positive for back pain.  Skin: Negative.   Neurological: Negative.     Allergies  Sulfa antibiotics  Home Medications   Prior to Admission medications   Medication Sig Start Date End Date Taking? Authorizing Provider  BIOTIN PO Take by mouth.   Yes Historical Provider, MD  CALCIUM PO Take by mouth.   Yes Historical Provider, MD  Cyanocobalamin (VITAMIN B-12 PO) Take by mouth.   Yes Historical Provider, MD  MAGNESIUM PO Take by mouth.   Yes Historical Provider, MD  Multiple Vitamins-Minerals (ZINC PO) Take by mouth.   Yes Historical Provider, MD  Pyridoxine HCl (VITAMIN B-6 PO) Take by mouth.   Yes Historical Provider, MD  dicyclomine (BENTYL) 20 MG tablet Take 1 tablet (20 mg total) by mouth every 6 (six) hours as needed for spasms (for abdominal cramping). 08/03/13   Kalman Drape, MD  HYDROcodone-acetaminophen (NORCO/VICODIN) 5-325 MG per tablet Take 1-2 tablets by mouth every 6 (six) hours as needed for moderate pain. 08/03/13   Kalman Drape, MD  ibuprofen (ADVIL,MOTRIN) 800 MG tablet Take 800 mg by mouth every 8 (eight) hours as needed for pain.    Historical Provider, MD  loperamide (IMODIUM A-D) 2 MG tablet Take 2 mg by mouth 4 (four) times daily as needed for diarrhea or loose stools.    Historical Provider, MD  ondansetron (ZOFRAN ODT) 8 MG disintegrating tablet Take 1 tablet (8 mg  total) by mouth every 8 (eight) hours as needed for nausea or vomiting. 08/03/13   Kalman Drape, MD  SUMAtriptan (IMITREX) 25 MG tablet Take 25 mg by mouth every 2 (two) hours as needed. For migraines    Historical Provider, MD   BP 147/99  Pulse 70  Temp(Src) 98.8 F (37.1 C)  Resp 20  SpO2 99% Physical Exam  Nursing note and vitals reviewed. Constitutional: She is oriented to person, place, and time. She appears well-developed and well-nourished. She appears distressed.  +tearful and visibly uncomfortable. Seated on exam table holding right lower abdomen while bent over forward  HENT:  Head: Normocephalic and atraumatic.  Eyes: Conjunctivae are normal. No scleral icterus.  Cardiovascular: Normal rate, regular rhythm and normal heart sounds.   Pulmonary/Chest: Effort normal and breath sounds normal. No respiratory distress. She has no wheezes.  Abdominal: Soft. Normal appearance. She exhibits no distension and no mass. Bowel sounds are decreased. There is tenderness in the right lower quadrant. There is CVA tenderness. There is no rigidity, no rebound and no guarding.  +obese +Right CVAT  Musculoskeletal: Normal range of motion.  Neurological: She is alert and oriented to person, place, and time.  Skin: Skin is warm and dry. No rash noted. No erythema.  Psychiatric: She has a normal mood and affect. Her behavior is normal.    ED Course  Procedures (including critical care time) Labs Review Labs Reviewed  POCT URINALYSIS DIP (DEVICE) - Abnormal; Notable for the following:    Hgb urine dipstick TRACE (*)    All other components within normal limits    Imaging Review No results found.   MDM   1. Right lower quadrant abdominal pain    43 y/o female with 24 hours of moderate to severe constant right lower abdominal/pelvic pain. Is s/p remote appendectomy. ?Ovarian torsion. Does not appear to have risk factors for mesenteric ischemia. Hemodynamically stable and to be transferred  to [facility] ER for further evaluation. IV NS placed at [facility]. Patient provided IV zofran 4mg  and Dilaudid 1mg  prior to transfer.    Normal, [place] 10/07/13 1935

## 2013-10-07 NOTE — ED Notes (Signed)
Per EMS, pt coming in via urgent care transfer for RLQ abd pain which radiates to her back and groin. Pt reports the pain began last night while at work. Pt has had appendix removed and gall bladder removed. Pt reports pain 8 out of 10 at this time. Pt received dilaudid at urgent care for pain which took the edge off the pain at first but it is now returning. 18 gauge right AC.

## 2013-10-07 NOTE — ED Notes (Signed)
Reporting some dulling of pain following Dilaudid.

## 2013-10-07 NOTE — ED Notes (Signed)
Assessment per L. Presson, PA.

## 2013-10-07 NOTE — ED Notes (Signed)
Report given to [facility].  Report called to [facility], ED Charge RN.

## 2013-10-07 NOTE — ED Notes (Signed)
Dr. Wentz at bedside. 

## 2013-10-08 ENCOUNTER — Emergency Department (HOSPITAL_COMMUNITY): Payer: 59

## 2013-10-08 LAB — WET PREP, GENITAL
TRICH WET PREP: NONE SEEN
YEAST WET PREP: NONE SEEN

## 2013-10-08 LAB — RPR

## 2013-10-08 LAB — HIV ANTIBODY (ROUTINE TESTING W REFLEX): HIV 1&2 Ab, 4th Generation: NONREACTIVE

## 2013-10-08 MED ORDER — OXYCODONE-ACETAMINOPHEN 5-325 MG PO TABS: 2.0000 | ORAL_TABLET | ORAL | Status: DC | PRN

## 2013-10-08 MED ORDER — ONDANSETRON HCL 4 MG/2ML IJ SOLN
4.0000 mg | Freq: Once | INTRAMUSCULAR | Status: AC
  Administered 2013-10-08: 4 mg via INTRAVENOUS
  Filled 2013-10-08: qty 2

## 2013-10-08 MED ORDER — ONDANSETRON 8 MG PO TBDP: 8.0000 mg | ORAL_TABLET | Freq: Three times a day (TID) | ORAL | Status: DC | PRN

## 2013-10-08 MED ORDER — IOHEXOL 300 MG/ML  SOLN
100.0000 mL | Freq: Once | INTRAMUSCULAR | Status: AC | PRN
  Administered 2013-10-08: 100 mL via INTRAVENOUS

## 2013-10-08 MED ORDER — OXYCODONE-ACETAMINOPHEN 5-325 MG PO TABS
2.0000 | ORAL_TABLET | Freq: Once | ORAL | Status: AC
  Administered 2013-10-08: 2 via ORAL
  Filled 2013-10-08: qty 2

## 2013-10-08 MED ORDER — FENTANYL CITRATE 0.05 MG/ML IJ SOLN
100.0000 ug | Freq: Once | INTRAMUSCULAR | Status: AC
  Administered 2013-10-08: 100 ug via INTRAVENOUS
  Filled 2013-10-08: qty 2

## 2013-10-08 MED ORDER — ONDANSETRON HCL 4 MG/2ML IJ SOLN: 4.0000 mg | Freq: Once | INTRAMUSCULAR | Status: DC

## 2013-10-08 NOTE — ED Provider Notes (Signed)
CSN: [id number]     Arrival date & time 10/07/13  1947 History   First MD Initiated Contact with Patient 10/07/13 2251     Chief Complaint  Patient presents with  . Abdominal Pain     (Consider location/radiation/quality/duration/timing/severity/associated sxs/prior Treatment) Patient is a 43 y.o. female presenting with abdominal pain. The history is provided by the patient.  Abdominal Pain  She complains of abdominal pain, right-sided, onset last night, constant, and worsening felt in the right lower quadrant of her abdomen. She denies fever, chills, nausea, vomiting, weakness, or dizziness. There's been no vaginal bleeding or vaginal discharge. She denies dysuria, urinary frequency, or hematuria. There's been no constipation. She has not had this previously. She is status post cholecystectomy, and appendectomy. No other known modifying factors  Past Medical History  Diagnosis Date  . Alopecia   . Autoimmune disease   . Bacterial vaginosis   . Migraine headache   . Frequent UTI   . Cyst of spleen    Past Surgical History  Procedure Laterality Date  . Cesarean section    . Cholecystectomy    . Appendectomy    . Cesarean section    . Wisdom tooth extraction    . Tubal ligation    . Dilitation & currettage/hystroscopy with novasure ablation N/A 07/19/2012    Procedure: HYSTEROSCOPY WITH NOVASURE ABLATION;  Surgeon: Farrel Gobble. Harrington Challenger, MD;  Location: [facility];  Service: Gynecology;  Laterality: N/A;  . Endometrial ablation     No family history on file. History  Substance Use Topics  . Smoking status: Never Smoker   . Smokeless tobacco: Never Used  . Alcohol Use: Yes     Comment: socially   OB History   Grav Para Term Preterm Abortions TAB SAB Ect Mult Living                 Review of Systems  Gastrointestinal: Positive for abdominal pain.  All other systems reviewed and are negative.     Allergies  Sulfa antibiotics  Home Medications   Prior to Admission medications    Medication Sig Start Date End Date Taking? Authorizing Provider  BIOTIN PO Take 1 tablet by mouth daily.    Yes Historical Provider, MD  CALCIUM PO Take 1 tablet by mouth daily.    Yes Historical Provider, MD  Cyanocobalamin (VITAMIN B-12 PO) Take 1 tablet by mouth daily.    Yes Historical Provider, MD  ibuprofen (ADVIL,MOTRIN) 800 MG tablet Take 800 mg by mouth every 8 (eight) hours as needed for pain.   Yes Historical Provider, MD  MAGNESIUM PO Take 1 tablet by mouth daily.    Yes Historical Provider, MD  Multiple Vitamins-Minerals (ZINC PO) Take 1 tablet by mouth daily.    Yes Historical Provider, MD  Pyridoxine HCl (VITAMIN B-6 PO) Take 1 tablet by mouth daily.    Yes Historical Provider, MD  SUMAtriptan (IMITREX) 25 MG tablet Take 25 mg by mouth every 2 (two) hours as needed. For migraines    Historical Provider, MD   BP 139/80  Pulse 49  Temp(Src) 98.9 F (37.2 C) (Oral)  Resp 20  Ht 5\' 8"  (1.727 m)  Wt 296 lb (134.265 kg)  BMI 45.02 kg/m2  SpO2 97% Physical Exam  Nursing note and vitals reviewed. Constitutional: She is oriented to person, place, and time. She appears well-developed and well-nourished.  HENT:  Head: Normocephalic and atraumatic.  Eyes: Conjunctivae and EOM are normal. Pupils are equal, round, and reactive  to light.  Neck: Normal range of motion and phonation normal. Neck supple.  Cardiovascular: Normal rate, regular rhythm and intact distal pulses.   Pulmonary/Chest: Effort normal and breath sounds normal. She exhibits no tenderness.  Abdominal: Soft. She exhibits no distension and no mass. There is tenderness (right lower quadrant/pelvic, tenderness, mild). There is no rebound and no guarding.  Genitourinary:  Normal external female genitalia. Small amount of dark, thick, vaginal discharge, that appears to be old blood. Mild right adnexal tenderness, without palpable mass  Musculoskeletal: Normal range of motion.  Neurological: She is alert and oriented to  person, place, and time. She exhibits normal muscle tone.  Skin: Skin is warm and dry.  Psychiatric: She has a normal mood and affect. Her behavior is normal. Judgment and thought content normal.    ED Course  Procedures (including critical care time)  Medications  0.9 %  sodium chloride infusion ( Intravenous New Bag/Given 10/07/13 2309)  fentaNYL (SUBLIMAZE) injection 100 mcg (100 mcg Intravenous Given 10/07/13 2056)  sodium chloride 0.9 % bolus 500 mL (500 mLs Intravenous Bolus from Bag 10/07/13 2310)  HYDROmorphone (DILAUDID) injection 1 mg (1 mg Intravenous Given 10/07/13 2308)  ondansetron (ZOFRAN) injection 4 mg (4 mg Intravenous Given 10/07/13 2308)  iohexol (OMNIPAQUE) 300 MG/ML solution 25 mL (25 mLs Oral Contrast Given 10/07/13 2317)  iohexol (OMNIPAQUE) 300 MG/ML solution 100 mL (100 mLs Intravenous Contrast Given 10/08/13 0013)    Patient Vitals for the past 24 hrs:  BP Temp Temp src Pulse Resp SpO2 Height Weight  10/07/13 2245 139/80 mmHg - - 49 - 97 % - -  10/07/13 2230 137/93 mmHg - - 49 - 100 % - -  10/07/13 2200 119/84 mmHg - - 66 - 100 % - -  10/07/13 2145 111/68 mmHg - - 47 - 97 % - -  10/07/13 2130 104/64 mmHg - - 49 - 98 % - -  10/07/13 2115 109/86 mmHg - - 61 - 98 % - -  10/07/13 1945 133/74 mmHg 98.9 F (37.2 C) Oral 53 20 100 % 5\' 8"  (1.727 m) 296 lb (134.265 kg)    12:10- AM Reevaluation with update and discussion. After initial assessment and treatment, an updated evaluation reveals her pain is better. She was updated on status.Daleen Bo L    Labs Review Labs Reviewed  WET PREP, GENITAL - Abnormal; Notable for the following:    Clue Cells Wet Prep HPF POC FEW (*)    WBC, Wet Prep HPF POC FEW (*)    All other components within normal limits  COMPREHENSIVE METABOLIC PANEL - Abnormal; Notable for the following:    Creatinine, Ser 1.20 (*)    Calcium 8.3 (*)    Albumin 3.1 (*)    GFR calc non Af Amer 55 (*)    GFR calc Af Amer 63 (*)    All other components  within normal limits  URINALYSIS, ROUTINE W REFLEX MICROSCOPIC - Abnormal; Notable for the following:    APPearance CLOUDY (*)    Hgb urine dipstick TRACE (*)    All other components within normal limits  URINE MICROSCOPIC-ADD ON - Abnormal; Notable for the following:    Casts HYALINE CASTS (*)    Crystals URIC ACID CRYSTALS (*)    All other components within normal limits  GC/CHLAMYDIA PROBE AMP  CBC WITH DIFFERENTIAL  LIPASE, BLOOD  PREGNANCY, URINE  RPR  HIV ANTIBODY (ROUTINE TESTING)    Imaging Review No results found.  EKG Interpretation None      MDM   Final diagnoses:  Pelvic pain in female    Nonspecific abdominal pain, patient is status post appendectomy, and cholecystectomy. She has tenderness on examination, and may have a pelvic infection. Cultures are pending for gonorrhea and chlamydia. Urinalysis and wet prep are not revealing for acute infectious processes.   Nursing Notes Reviewed/ Care Coordinated, and agree without changes. Applicable Imaging Reviewed.  Interpretation of Laboratory Data incorporated into ED treatment  Care to Dr. Sharol Given, to disposition after CT returns.  Richarda Blade, MD 10/08/13 [phone]

## 2013-10-08 NOTE — Discharge Instructions (Signed)
Pelvic Pain Female pelvic pain can be caused by many different things and start from a variety of places. Pelvic pain refers to pain that is located in the lower half of the abdomen and between your hips. The pain may occur over a short period of time (acute) or may be reoccurring (chronic). The cause of pelvic pain may be related to disorders affecting the female reproductive organs (gynecologic), but it may also be related to the bladder, kidney stones, an intestinal complication, or muscle or skeletal problems. Getting help right away for pelvic pain is important, especially if there has been severe, sharp, or a sudden onset of unusual pain. It is also important to get help right away because some types of pelvic pain can be life threatening.  CAUSES  Below are only some of the causes of pelvic pain. The causes of pelvic pain can be in one of several categories.   Gynecologic.  Pelvic inflammatory disease.  Sexually transmitted infection.  Ovarian cyst or a twisted ovarian ligament (ovarian torsion).  Uterine lining that grows outside the uterus (endometriosis).  Fibroids, cysts, or tumors.  Ovulation.  Pregnancy.  Pregnancy that occurs outside the uterus (ectopic pregnancy).  Miscarriage.  Labor.  Abruption of the placenta or ruptured uterus.  Infection.  Uterine infection (endometritis).  Bladder infection.  Diverticulitis.  Miscarriage related to a uterine infection (septic abortion).  Bladder.  Inflammation of the bladder (cystitis).  Kidney stone(s).  Gastrointestinal.  Constipation.  Diverticulitis.  Neurologic.  Trauma.  Feeling pelvic pain because of mental or emotional causes (psychosomatic).  Cancers of the bowel or pelvis. EVALUATION  Your caregiver will want to take a careful history of your concerns. This includes recent changes in your health, a careful gynecologic history of your periods (menses), and a sexual history. Obtaining your family  history and medical history is also important. Your caregiver may suggest a pelvic exam. A pelvic exam will help identify the location and severity of the pain. It also helps in the evaluation of which organ system may be involved. In order to identify the cause of the pelvic pain and be properly treated, your caregiver may order tests. These tests may include:   A pregnancy test.  Pelvic ultrasonography.  An X-ray exam of the abdomen.  A urinalysis or evaluation of vaginal discharge.  Blood tests. HOME CARE INSTRUCTIONS   Only take over-the-counter or prescription medicines for pain, discomfort, or fever as directed by your caregiver.   Rest as directed by your caregiver.   Eat a balanced diet.   Drink enough fluids to make your urine clear or pale yellow, or as directed.   Avoid sexual intercourse if it causes pain.   Apply warm or cold compresses to the lower abdomen depending on which one helps the pain.   Avoid stressful situations.   Keep a journal of your pelvic pain. Write down when it started, where the pain is located, and if there are things that seem to be associated with the pain, such as food or your menstrual cycle.  Follow up with your caregiver as directed.  SEEK MEDICAL CARE IF:  Your medicine does not help your pain.  You have abnormal vaginal discharge. SEEK IMMEDIATE MEDICAL CARE IF:   You have heavy bleeding from the vagina.   Your pelvic pain increases.   You feel light-headed or faint.   You have chills.   You have pain with urination or blood in your urine.   You have uncontrolled diarrhea   or vomiting.   You have a fever or persistent symptoms for more than 3 days.  You have a fever and your symptoms suddenly get worse.   You are being physically or sexually abused.  MAKE SURE YOU:  Understand these instructions.  Will watch your condition.  Will get help if you are not doing well or get worse. Document Released:  01/21/2004 Document Revised: 07/10/2013 Document Reviewed: 06/15/2011 Exit[employer] Patient Information 2015 [employer]. This information is not intended to replace advice given to you by your health care provider. Make sure you discuss any questions you have with your health care provider.  

## 2013-10-08 NOTE — ED Notes (Signed)
Discharge instructions reviewed with pt. Pt verbalized understanding.   

## 2013-10-08 NOTE — ED Provider Notes (Signed)
Care assumed from Dr Eulis Foster at change of shift.  Pt with RLQ pain, awaiting CT scan.  Pain better after medications.  Pt re-evaluated after CT scan, pain is returning.  No cause for pain on CT scan.  Will order u/s pelvis, r/o torsion.  Results for orders placed during the hospital encounter of 10/07/13  WET PREP, GENITAL      Result Value Ref Range   Yeast Wet Prep HPF POC NONE SEEN  NONE SEEN   Trich, Wet Prep NONE SEEN  NONE SEEN   Clue Cells Wet Prep HPF POC FEW (*) NONE SEEN   WBC, Wet Prep HPF POC FEW (*) NONE SEEN  CBC WITH DIFFERENTIAL      Result Value Ref Range   WBC 9.7  4.0 - 10.5 K/uL   RBC 4.22  3.87 - 5.11 MIL/uL   Hemoglobin 12.8  12.0 - 15.0 g/dL   HCT 39.3  36.0 - 46.0 %   MCV 93.1  78.0 - 100.0 fL   MCH 30.3  26.0 - 34.0 pg   MCHC 32.6  30.0 - 36.0 g/dL   RDW 14.1  11.5 - 15.5 %   Platelets 312  150 - 400 K/uL   Neutrophils Relative % 67  43 - 77 %   Neutro Abs 6.6  1.7 - 7.7 K/uL   Lymphocytes Relative 24  12 - 46 %   Lymphs Abs 2.3  0.7 - 4.0 K/uL   Monocytes Relative 6  3 - 12 %   Monocytes Absolute 0.6  0.1 - 1.0 K/uL   Eosinophils Relative 2  0 - 5 %   Eosinophils Absolute 0.2  0.0 - 0.7 K/uL   Basophils Relative 1  0 - 1 %   Basophils Absolute 0.1  0.0 - 0.1 K/uL  COMPREHENSIVE METABOLIC PANEL      Result Value Ref Range   Sodium 139  137 - 147 mEq/L   Potassium 4.3  3.7 - 5.3 mEq/L   Chloride 103  96 - 112 mEq/L   CO2 24  19 - 32 mEq/L   Glucose, Bld 94  70 - 99 mg/dL   BUN 18  6 - 23 mg/dL   Creatinine, Ser 1.20 (*) 0.50 - 1.10 mg/dL   Calcium 8.3 (*) 8.4 - 10.5 mg/dL   Total Protein 6.8  6.0 - 8.3 g/dL   Albumin 3.1 (*) 3.5 - 5.2 g/dL   AST 23  0 - 37 U/L   ALT 28  0 - 35 U/L   Alkaline Phosphatase 67  39 - 117 U/L   Total Bilirubin 0.3  0.3 - 1.2 mg/dL   GFR calc non Af Amer 55 (*) >90 mL/min   GFR calc Af Amer 63 (*) >90 mL/min   Anion gap 12  5 - 15  LIPASE, BLOOD      Result Value Ref Range   Lipase 29  11 - 59 U/L  URINALYSIS,  ROUTINE W REFLEX MICROSCOPIC      Result Value Ref Range   Color, Urine YELLOW  YELLOW   APPearance CLOUDY (*) CLEAR   Specific Gravity, Urine 1.020  1.005 - 1.030   pH 5.0  5.0 - 8.0   Glucose, UA NEGATIVE  NEGATIVE mg/dL   Hgb urine dipstick TRACE (*) NEGATIVE   Bilirubin Urine NEGATIVE  NEGATIVE   Ketones, ur NEGATIVE  NEGATIVE mg/dL   Protein, ur NEGATIVE  NEGATIVE mg/dL   Urobilinogen, UA 0.2  0.0 - 1.0  mg/dL   Nitrite NEGATIVE  NEGATIVE   Leukocytes, UA NEGATIVE  NEGATIVE  PREGNANCY, URINE      Result Value Ref Range   Preg Test, Ur NEGATIVE  NEGATIVE  URINE MICROSCOPIC-ADD ON      Result Value Ref Range   Squamous Epithelial / LPF RARE  RARE   WBC, UA 0-2  <3 WBC/hpf   RBC / HPF 0-2  <3 RBC/hpf   Bacteria, UA RARE  RARE   Casts HYALINE CASTS (*) NEGATIVE   Crystals URIC ACID CRYSTALS (*) NEGATIVE   US Transvaginal Non-ob  10/08/2013   CLINICAL DATA:  Right adnexal pain, assess for cyst versus torsion. Status post port appendectomy and cholecystectomy.  EXAM: TRANSABDOMINAL AND TRANSVAGINAL ULTRASOUND OF PELVIS  DOPPLER ULTRASOUND OF OVARIES  TECHNIQUE: Both transabdominal and transvaginal ultrasound examinations of the pelvis were performed. Transabdominal technique was performed for global imaging of the pelvis including uterus, ovaries, adnexal regions, and pelvic cul-de-sac.  It was necessary to proceed with endovaginal exam following the transabdominal exam to visualize the ovaries. Color and duplex Doppler ultrasound was utilized to evaluate blood flow to the ovaries.  COMPARISON:  CT of the abdomen and pelvis October 08, 2013.  FINDINGS: Uterus  Measurements: 7.2 x 4.9 x 4.9 cm. Hypoechoic at least 2.4 x 2 x 2.2 cm right miduterine body intramural leiomyoma.  Endometrium  Thickness: 8 mm. Focally contracted in appearance without discrete mass.  Right ovary  Measurements: Approximately 1.9 x 1.5 x 1 1.5 cm, and appears posteriorly located in the pelvis, limiting evaluation.  Left  ovary  Measurements: 2.5 x 1.1 x 1.4 cm. Normal appearance/no adnexal mass. 11 mm left adnexal follicle versus parovarian cyst. Limited assessment due to apparent posterior positioning.  Pulsed Doppler evaluation of both ovaries demonstrates normal low-resistance arterial and venous waveforms.  Other findings  No free fluid.  IMPRESSION: Limited assessment of the ovaries due to apparent posterior positioning, without imaging evidence of torsion.  At least 2.4 x 2 x 2.2 cm right mid uterine body intramural leiomyoma.  Slightly heterogeneous irregular endometrium may reflect sequela of patient's known ablation without discrete mass.   Electronically Signed   By: Elon Alas   On: 10/08/2013 04:00   US Pelvis Complete  10/08/2013   CLINICAL DATA:  Right adnexal pain, assess for cyst versus torsion. Status post port appendectomy and cholecystectomy.  EXAM: TRANSABDOMINAL AND TRANSVAGINAL ULTRASOUND OF PELVIS  DOPPLER ULTRASOUND OF OVARIES  TECHNIQUE: Both transabdominal and transvaginal ultrasound examinations of the pelvis were performed. Transabdominal technique was performed for global imaging of the pelvis including uterus, ovaries, adnexal regions, and pelvic cul-de-sac.  It was necessary to proceed with endovaginal exam following the transabdominal exam to visualize the ovaries. Color and duplex Doppler ultrasound was utilized to evaluate blood flow to the ovaries.  COMPARISON:  CT of the abdomen and pelvis October 08, 2013.  FINDINGS: Uterus  Measurements: 7.2 x 4.9 x 4.9 cm. Hypoechoic at least 2.4 x 2 x 2.2 cm right miduterine body intramural leiomyoma.  Endometrium  Thickness: 8 mm. Focally contracted in appearance without discrete mass.  Right ovary  Measurements: Approximately 1.9 x 1.5 x 1 1.5 cm, and appears posteriorly located in the pelvis, limiting evaluation.  Left ovary  Measurements: 2.5 x 1.1 x 1.4 cm. Normal appearance/no adnexal mass. 11 mm left adnexal follicle versus parovarian cyst.  Limited assessment due to apparent posterior positioning.  Pulsed Doppler evaluation of both ovaries demonstrates normal low-resistance arterial and venous waveforms.  Other findings  No free fluid.  IMPRESSION: Limited assessment of the ovaries due to apparent posterior positioning, without imaging evidence of torsion.  At least 2.4 x 2 x 2.2 cm right mid uterine body intramural leiomyoma.  Slightly heterogeneous irregular endometrium may reflect sequela of patient's known ablation without discrete mass.   Electronically Signed   By: Elon Alas   On: 10/08/2013 04:00   Ct Abdomen Pelvis W Contrast  10/08/2013   CLINICAL DATA:  Hervey Ard lower right-sided abdominal and pelvic pain.  EXAM: CT ABDOMEN AND PELVIS WITH CONTRAST  TECHNIQUE: Multidetector CT imaging of the abdomen and pelvis was performed using the standard protocol following bolus administration of intravenous contrast.  CONTRAST:  12mL OMNIPAQUE IOHEXOL 300 MG/ML  SOLN  COMPARISON:  CT abdomen and pelvis 03/21/2013. MRI abdomen 04/04/2013.  FINDINGS: Dependent atelectasis in the lung bases.  Surgical absence of the gallbladder. Peripheral cyst in the superior spleen 2.9 cm, No significant change since prior study. Hypodense lesion in the inferior right lobe of the liver is unchanged since previous study and was shown on MR to represent a hemangioma. No other focal liver lesions. The pancreas, adrenal glands, kidneys, abdominal aorta, inferior vena cava, and retroperitoneal lymph nodes are unremarkable. Stomach is normal for degree of distention. Small bowel are decompressed. Stool-filled colon without distention. No free air or free fluid in the abdomen.  Pelvis: Appendix is not identified. No inflammatory changes are shown. No evidence of diverticulitis. Uterus and ovaries are not enlarged. No pelvic mass or lymphadenopathy. No free or loculated pelvic fluid collections. Bladder wall is not thickened. No destructive bone lesions. Sclerosis in the  sacroiliac joints bilaterally may indicate sacroiliitis or reactive change. The joint space is preserved.  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 No acute inflammatory process demonstrated in the abdomen or pelvis. Nonspecific sclerosis adjacent to the sacroiliac joints with joint space preserved.   Electronically Signed   By: Lucienne Capers M.D.   On: 10/08/2013 00:48   [name] Art/ven Flow Abd Pelv Doppler  10/08/2013   CLINICAL DATA:  Right adnexal pain, assess for cyst versus torsion. Status post port appendectomy and cholecystectomy.  EXAM: TRANSABDOMINAL AND TRANSVAGINAL ULTRASOUND OF PELVIS  DOPPLER ULTRASOUND OF OVARIES  TECHNIQUE: Both transabdominal and transvaginal ultrasound examinations of the pelvis were performed. Transabdominal technique was performed for global imaging of the pelvis including uterus, ovaries, adnexal regions, and pelvic cul-de-sac.  It was necessary to proceed with endovaginal exam following the transabdominal exam to visualize the ovaries. Color and duplex Doppler ultrasound was utilized to evaluate blood flow to the ovaries.  COMPARISON:  CT of the abdomen and pelvis October 08, 2013.  FINDINGS: Uterus  Measurements: 7.2 x 4.9 x 4.9 cm. Hypoechoic at least 2.4 x 2 x 2.2 cm right miduterine body intramural leiomyoma.  Endometrium  Thickness: 8 mm. Focally contracted in appearance without discrete mass.  Right ovary  Measurements: Approximately 1.9 x 1.5 x 1 1.5 cm, and appears posteriorly located in the pelvis, limiting evaluation.  Left ovary  Measurements: 2.5 x 1.1 x 1.4 cm. Normal appearance/no adnexal mass. 11 mm left adnexal follicle versus parovarian cyst. Limited assessment due to apparent posterior positioning.  Pulsed Doppler evaluation of both ovaries demonstrates normal low-resistance arterial and venous waveforms.  Other findings  No free fluid.  IMPRESSION: Limited assessment of the ovaries due to apparent posterior positioning,  without imaging evidence of torsion.  At least 2.4 x 2 x  2.2 cm right mid uterine body intramural leiomyoma.  Slightly heterogeneous irregular endometrium may reflect sequela of patient's known ablation without discrete mass.   Electronically Signed   By: Elon Alas   On: 10/08/2013 04:00    No specific cause for pain noted.  Will d/c home to f/u with her doctors.  Kalman Drape, MD 10/08/13 [phone]

## 2013-10-09 LAB — GC/CHLAMYDIA PROBE AMP
CT Probe RNA: NEGATIVE
GC Probe RNA: NEGATIVE

## 2013-10-09 NOTE — ED Provider Notes (Signed)
Medical screening examination/treatment/procedure(s) were performed by non-physician practitioner and as supervising physician I was immediately available for consultation/collaboration.  Philipp Deputy, M.D.   Harden Mo, MD 10/09/13 [phone]

## 2014-03-26 ENCOUNTER — Ambulatory Visit (INDEPENDENT_AMBULATORY_CARE_PROVIDER_SITE_OTHER): Payer: 59 | Admitting: Urgent Care

## 2014-03-26 VITALS — BP 134/80 | HR 76 | Temp 98.6°F | Resp 18 | Ht 68.0 in | Wt 288.0 lb

## 2014-03-26 DIAGNOSIS — N390 Urinary tract infection, site not specified: Secondary | ICD-10-CM

## 2014-03-26 DIAGNOSIS — R1011 Right upper quadrant pain: Secondary | ICD-10-CM

## 2014-03-26 DIAGNOSIS — R309 Painful micturition, unspecified: Secondary | ICD-10-CM

## 2014-03-26 DIAGNOSIS — R350 Frequency of micturition: Secondary | ICD-10-CM

## 2014-03-26 LAB — COMPREHENSIVE METABOLIC PANEL
ALK PHOS: 56 U/L (ref 39–117)
ALT: 44 U/L — ABNORMAL HIGH (ref 0–35)
AST: 78 U/L — ABNORMAL HIGH (ref 0–37)
Albumin: 3.5 g/dL (ref 3.5–5.2)
BUN: 12 mg/dL (ref 6–23)
CO2: 26 mEq/L (ref 19–32)
Calcium: 8.9 mg/dL (ref 8.4–10.5)
Chloride: 104 mEq/L (ref 96–112)
Creat: 1.11 mg/dL — ABNORMAL HIGH (ref 0.50–1.10)
Glucose, Bld: 85 mg/dL (ref 70–99)
POTASSIUM: 4.5 meq/L (ref 3.5–5.3)
Sodium: 139 mEq/L (ref 135–145)
Total Bilirubin: 0.4 mg/dL (ref 0.2–1.2)
Total Protein: 6.7 g/dL (ref 6.0–8.3)

## 2014-03-26 LAB — POCT URINALYSIS DIPSTICK
BILIRUBIN UA: NEGATIVE
Glucose, UA: NEGATIVE
KETONES UA: NEGATIVE
NITRITE UA: POSITIVE
PH UA: 6
Protein, UA: NEGATIVE
Spec Grav, UA: <=1.005
Urobilinogen, UA: 0.2

## 2014-03-26 LAB — POCT UA - MICROSCOPIC ONLY
CRYSTALS, UR, HPF, POC: NEGATIVE
MUCUS UA: NEGATIVE
RBC, URINE, MICROSCOPIC: NEGATIVE
YEAST UA: NEGATIVE

## 2014-03-26 MED ORDER — FLUCONAZOLE 150 MG PO TABS: 150.0000 mg | ORAL_TABLET | Freq: Once | ORAL | Status: DC

## 2014-03-26 MED ORDER — CIPROFLOXACIN HCL 500 MG PO TABS: 500.0000 mg | ORAL_TABLET | Freq: Two times a day (BID) | ORAL | Status: AC

## 2014-03-26 NOTE — Progress Notes (Signed)
MRN: [id number] DOB: Jun 01, 1970  Subjective:   Margaret Fletcher is a 44 y.o. female presenting for 6 day history of urinary frequency. Associated symptoms include urgency, feeling of incomplete emptying, dysruria, mild right-sided flank pain. Denies hematuria, vaginal discharge or irritation, rashes, fevers, abdominal pain, n/v, diarrhea, chest pain, shob. Denies smoking or alcohol use. Denies any other aggravating or relieving factors, no other questions or concerns.  Margaret Fletcher has a current medication list which includes the following prescription(s): biotin, bupropion, calcium, cyanocobalamin, magnesium, pyridoxine hcl, multiple vitamins-minerals, and sumatriptan.  She is allergic to sulfa antibiotics.  Margaret Fletcher  has a past medical history of Alopecia; Autoimmune disease; Bacterial vaginosis; Migraine headache; Frequent UTI; and Cyst of spleen. Also  has past surgical history that includes Cesarean section; Cholecystectomy; Appendectomy; Cesarean section; Wisdom tooth extraction; Tubal ligation; Dilatation & currettage/hysteroscopy with novasure ablation (N/A, 07/19/2012); and Endometrial ablation.  ROS As in subjective.  Objective:   Vitals: BP 134/80 mmHg  Pulse 76  Temp(Src) 98.6 F (37 C) (Oral)  Resp 18  Ht 5\' 8"  (1.727 m)  Wt 288 lb (130.636 kg)  BMI 43.80 kg/m2  SpO2 100%  Physical Exam  Constitutional: She is oriented to person, place, and time and well-developed, well-nourished, and in no distress.  Eyes: Conjunctivae are normal. No scleral icterus.  Cardiovascular: Normal rate, regular rhythm, normal heart sounds and intact distal pulses.  Exam reveals no gallop and no friction rub.   No murmur heard. Pulmonary/Chest: Effort normal and breath sounds normal. No respiratory distress. She has no wheezes. She has no rales. She exhibits no tenderness.  Abdominal: Soft. Bowel sounds are normal. She exhibits no distension and no mass. There is tenderness (RUQ on exam). There  is no rebound and no guarding.  No CVA tenderness.  Neurological: She is alert and oriented to person, place, and time.  Skin: Skin is warm and dry. No rash noted. No erythema.  Psychiatric: Mood and affect normal.   Results for orders placed or performed in visit on 03/26/14 (from the past 24 hour(s))  POCT urinalysis dipstick     Status: None   Collection Time: 03/26/14 12:07 PM  Result Value Ref Range   Color, UA yellow    Clarity, UA cloudy    Glucose, UA neg    Bilirubin, UA neg    Ketones, UA neg    Spec Grav, UA <=1.005    Blood, UA tr-intact    pH, UA 6.0    Protein, UA neg    Urobilinogen, UA 0.2    Nitrite, UA positive    Leukocytes, UA large (3+)   POCT UA - Microscopic Only     Status: None   Collection Time: 03/26/14 12:08 PM  Result Value Ref Range   WBC, Ur, HPF, POC tntc    RBC, urine, microscopic neg    Bacteria, U Microscopic 4+    Mucus, UA neg    Epithelial cells, urine per micros 0-4    Crystals, Ur, HPF, POC neg    Casts, Ur, LPF, POC renal tubular    Yeast, UA neg    Assessment and Plan :   1. Frequent urination 2. Pain with urination 3. Urinary tract infection without hematuria, site unspecified - Start Cipro x7 days, diflucan for antibiotic-associated yeast infection - Advised to f/u with PCP, consider referral to urologist for frequent UTI for further work-up  4. RUQ pain - Elicited on exam, has hx of cholecystectomy, patient is stable today, CMet  today, advised to f/u with PCP  Jaynee Eagles, PA-C [facility] [phone] 03/26/2014 12:47 PM

## 2014-03-26 NOTE — Patient Instructions (Signed)
-   Take ciprofloxacin for your UTI twice a day, 7 days - Take diflucan for yeast infections associated with antibiotic use - I encourage you to follow up with your PCP regarding your abdominal pain, I will report/mail lab results to you in a few days when they become available  Urinary Tract Infection Urinary tract infections (UTIs) can develop anywhere along your urinary tract. Your urinary tract is your body's drainage system for removing wastes and extra water. Your urinary tract includes two kidneys, two ureters, a bladder, and a urethra. Your kidneys are a pair of bean-shaped organs. Each kidney is about the size of your fist. They are located below your ribs, one on each side of your spine. CAUSES Infections are caused by microbes, which are microscopic organisms, including fungi, viruses, and bacteria. These organisms are so small that they can only be seen through a microscope. Bacteria are the microbes that most commonly cause UTIs. SYMPTOMS  Symptoms of UTIs may vary by age and gender of the patient and by the location of the infection. Symptoms in young women typically include a frequent and intense urge to urinate and a painful, burning feeling in the bladder or urethra during urination. Older women and men are more likely to be tired, shaky, and weak and have muscle aches and abdominal pain. A fever may mean the infection is in your kidneys. Other symptoms of a kidney infection include pain in your back or sides below the ribs, nausea, and vomiting. DIAGNOSIS To diagnose a UTI, your caregiver will ask you about your symptoms. Your caregiver also will ask to provide a urine sample. The urine sample will be tested for bacteria and white blood cells. White blood cells are made by your body to help fight infection. TREATMENT  Typically, UTIs can be treated with medication. Because most UTIs are caused by a bacterial infection, they usually can be treated with the use of antibiotics. The choice  of antibiotic and length of treatment depend on your symptoms and the type of bacteria causing your infection. HOME CARE INSTRUCTIONS  If you were prescribed antibiotics, take them exactly as your caregiver instructs you. Finish the medication even if you feel better after you have only taken some of the medication.  Drink enough water and fluids to keep your urine clear or pale yellow.  Avoid caffeine, tea, and carbonated beverages. They tend to irritate your bladder.  Empty your bladder often. Avoid holding urine for long periods of time.  Empty your bladder before and after sexual intercourse.  After a bowel movement, women should cleanse from front to back. Use each tissue only once. SEEK MEDICAL CARE IF:   You have back pain.  You develop a fever.  Your symptoms do not begin to resolve within 3 days. SEEK IMMEDIATE MEDICAL CARE IF:   You have severe back pain or lower abdominal pain.  You develop chills.  You have nausea or vomiting.  You have continued burning or discomfort with urination. MAKE SURE YOU:   Understand these instructions.  Will watch your condition.  Will get help right away if you are not doing well or get worse. Document Released: 12/03/2004 Document Revised: 08/25/2011 Document Reviewed: 04/03/2011 [facility] Patient Information 2015 [place], [place]. This information is not intended to replace advice given to you by your health care provider. Make sure you discuss any questions you have with your health care provider.

## 2014-03-29 LAB — URINE CULTURE

## 2014-04-01 ENCOUNTER — Encounter: Payer: Self-pay | Admitting: Urgent Care

## 2014-04-21 DIAGNOSIS — R51 Headache: Secondary | ICD-10-CM

## 2014-04-21 DIAGNOSIS — R519 Headache, unspecified: Secondary | ICD-10-CM | POA: Insufficient documentation

## 2014-04-25 ENCOUNTER — Other Ambulatory Visit (INDEPENDENT_AMBULATORY_CARE_PROVIDER_SITE_OTHER): Payer: Self-pay

## 2014-05-21 ENCOUNTER — Ambulatory Visit (HOSPITAL_COMMUNITY)
Admission: RE | Admit: 2014-05-21 | Discharge: 2014-05-21 | Disposition: A | Discharge status: Home / Self Care | Payer: 59 | Source: Ambulatory Visit | Attending: General Surgery | Admitting: General Surgery

## 2014-05-21 ENCOUNTER — Other Ambulatory Visit: Payer: Self-pay

## 2014-05-21 ENCOUNTER — Encounter (HOSPITAL_COMMUNITY)
Admission: RE | Disposition: A | Discharge status: Home / Self Care | Payer: Self-pay | Source: Ambulatory Visit | Attending: General Surgery

## 2014-05-21 DIAGNOSIS — Z01818 Encounter for other preprocedural examination: Secondary | ICD-10-CM | POA: Diagnosis present

## 2014-05-21 DIAGNOSIS — D734 Cyst of spleen: Secondary | ICD-10-CM | POA: Diagnosis not present

## 2014-05-21 DIAGNOSIS — R109 Unspecified abdominal pain: Secondary | ICD-10-CM | POA: Diagnosis not present

## 2014-05-21 DIAGNOSIS — K7689 Other specified diseases of liver: Secondary | ICD-10-CM | POA: Insufficient documentation

## 2014-05-21 HISTORY — PX: BREATH TEK H PYLORI: SHX5422

## 2014-05-21 SURGERY — BREATH TEST, FOR HELICOBACTER PYLORI

## 2014-05-21 NOTE — Progress Notes (Signed)
   05/21/14 [address] [place]  Time of Last PO Intake 2355  Baseline Breath At: 1916  Pranactin Given At: 6060  Post-Dose Breath At: 0852  Sample 1 3.3  Sample 2 1.9  Test Negative

## 2014-05-23 ENCOUNTER — Encounter (HOSPITAL_COMMUNITY): Payer: Self-pay | Admitting: General Surgery

## 2014-05-23 ENCOUNTER — Ambulatory Visit: Payer: 59 | Admitting: Dietician

## 2014-05-24 ENCOUNTER — Encounter: Payer: 59 | Attending: General Surgery | Admitting: Dietician

## 2014-05-24 DIAGNOSIS — Z6841 Body Mass Index (BMI) 40.0 and over, adult: Secondary | ICD-10-CM | POA: Diagnosis not present

## 2014-05-24 DIAGNOSIS — Z713 Dietary counseling and surveillance: Secondary | ICD-10-CM | POA: Insufficient documentation

## 2014-05-24 NOTE — Progress Notes (Signed)
  Pre-Op Assessment Visit:  Pre-Operative RYGB Surgery  Medical Nutrition Therapy:  Appt start time: 1610   End time:  1115  Patient was seen on 05/24/2014 for Pre-Operative Nutrition Assessment. Assessment and letter of approval faxed to [facility] Bariatric Surgery Program coordinator on 05/24/2014.   Preferred Learning Style:  No preference indicated   Learning Readiness:   Ready  Handouts given during visit include:  Pre-Op Goals Bariatric Surgery Protein Shakes   During the appointment today the following Pre-Op Goals were reviewed with the patient: Maintain or lose weight as instructed by your surgeon Make healthy food choices Begin to limit portion sizes Limited concentrated sugars and fried foods Keep fat/sugar in the single digits per serving on   food labels Practice CHEWING your food  (aim for 30 chews per bite or until applesauce consistency) Practice not drinking 15 minutes before, during, and 30 minutes after each meal/snack Avoid all carbonated beverages  Avoid/limit caffeinated beverages  Avoid all sugar-sweetened beverages Consume 3 meals per day; eat every 3-5 hours Make a list of non-food related activities Aim for 64-100 ounces of FLUID daily  Aim for at least 60-80 grams of PROTEIN daily Look for a liquid protein source that contain ?15 g protein and ?5 g carbohydrate  (ex: shakes, drinks, shots)  Patient-Centered Goals: -Overall health   Demonstrated degree of understanding via:  Teach Back  Teaching Method Utilized:  Visual Auditory Hands on  Barriers to learning/adherence to lifestyle change: none  Patient to call the [facility] to enroll in Pre-Op and Post-Op Nutrition Education when surgery date is scheduled.

## 2014-05-24 NOTE — Patient Instructions (Signed)

## 2014-07-25 ENCOUNTER — Ambulatory Visit (HOSPITAL_BASED_OUTPATIENT_CLINIC_OR_DEPARTMENT_OTHER): Payer: 59 | Attending: General Surgery | Admitting: Radiology

## 2014-07-25 VITALS — Ht 68.0 in | Wt 280.0 lb

## 2014-07-25 DIAGNOSIS — R0683 Snoring: Secondary | ICD-10-CM | POA: Insufficient documentation

## 2014-07-25 DIAGNOSIS — G4730 Sleep apnea, unspecified: Secondary | ICD-10-CM

## 2014-07-25 DIAGNOSIS — G4733 Obstructive sleep apnea (adult) (pediatric): Secondary | ICD-10-CM | POA: Insufficient documentation

## 2014-07-25 DIAGNOSIS — G4719 Other hypersomnia: Secondary | ICD-10-CM

## 2014-07-25 DIAGNOSIS — G4710 Hypersomnia, unspecified: Secondary | ICD-10-CM | POA: Diagnosis present

## 2014-08-04 DIAGNOSIS — R0683 Snoring: Secondary | ICD-10-CM | POA: Diagnosis not present

## 2014-08-04 DIAGNOSIS — G4710 Hypersomnia, unspecified: Secondary | ICD-10-CM | POA: Diagnosis not present

## 2014-08-04 DIAGNOSIS — G4719 Other hypersomnia: Secondary | ICD-10-CM | POA: Diagnosis not present

## 2014-08-04 NOTE — Sleep Study (Signed)
   NAME: Margaret Fletcher DATE OF BIRTH:  Sep 18, 1970 MEDICAL RECORD NUMBER [id number]  LOCATION: [facility]  PHYSICIAN: Yaman Grauberger D  DATE OF STUDY: 07/25/2014  SLEEP STUDY TYPE: Nocturnal Polysomnogram (Daytime study)               REFERRING PHYSICIAN: Greer Pickerel, MD  INDICATION FOR STUDY: Hypersomnia with sleep apnea  EPWORTH SLEEPINESS SCORE:   14/24 HEIGHT: 5\' 8"  (172.7 cm)  WEIGHT: 280 lb (127.007 kg)    Body mass index is 42.58 kg/(m^2).  NECK SIZE: 15.5 in.  MEDICATIONS: Charted for review  SLEEP ARCHITECTURE: Total sleep time 324.5 minutes, sleep efficiency 89.9%. Stage I was 6%, stage II 63.5%, stage III absent, REM 30.5% of total sleep time. Sleep latency 27 minutes, REM latency 53.5 minutes, awake after sleep onset 9.5 minutes, arousal index 8.7, bedtime medication: None.   RESPIRATORY DATA: Apnea hypopnea index (AHI) 12.4 per hour. 67 total events scored including 17 obstructive apneas, 1 central apnea, 49 hypopneas. Non-positional events. REM AHI 37.6 per hour. This study was ordered as a diagnostic polysomnogram protocol without CPAP.   OXYGEN DATA: Very loud snoring with oxygen desaturation to a nadir of 79% and mean saturation 95.7% on room air  CARDIAC DATA: Normal sinus rhythm  MOVEMENT/PARASOMNIA: No significant movement disturbance, bathroom 1  IMPRESSION/ RECOMMENDATION:   1) This study was performed as a daytime polysomnogram to accommodate the patient's usual sleep schedule. Lights out at 8:11 AM and lights on at 14 12 PM, no bedtime medication. 2) Mild obstructive sleep apnea/hypopnea syndrome, AHI  12.4 per hour with non-positional events. REM AHI 37.6 per hour. Very loud snoring with oxygen desaturation to a nadir of 79% and mean saturation 95.7% on room air 3) This study was ordered as a diagnostic polysomnogram protocol. The patient can return for dedicated CPAP titration study if appropriate.   Deneise Lever Diplomate,  American Board of Sleep Medicine  ELECTRONICALLY SIGNED ON:  08/04/2014, 9:55 AM [place] PH: [phone]   FX: [phone] [place]

## 2014-08-13 ENCOUNTER — Ambulatory Visit: Payer: 59

## 2014-09-11 ENCOUNTER — Ambulatory Visit: Payer: 59

## 2014-12-04 ENCOUNTER — Ambulatory Visit (INDEPENDENT_AMBULATORY_CARE_PROVIDER_SITE_OTHER): Payer: 59 | Admitting: Emergency Medicine

## 2014-12-04 VITALS — BP 122/80 | HR 86 | Temp 97.7°F | Resp 16 | Ht 68.25 in | Wt 279.0 lb

## 2014-12-04 DIAGNOSIS — L02219 Cutaneous abscess of trunk, unspecified: Secondary | ICD-10-CM | POA: Diagnosis not present

## 2014-12-04 DIAGNOSIS — L03319 Cellulitis of trunk, unspecified: Secondary | ICD-10-CM

## 2014-12-04 MED ORDER — DOXYCYCLINE HYCLATE 100 MG PO CAPS: 100.0000 mg | ORAL_CAPSULE | Freq: Two times a day (BID) | ORAL | Status: DC

## 2014-12-04 MED ORDER — FLUCONAZOLE 150 MG PO TABS
150.0000 mg | ORAL_TABLET | Freq: Once | ORAL | Status: DC
Start: 2014-12-04 — End: 2015-03-25

## 2014-12-04 NOTE — Patient Instructions (Signed)

## 2014-12-04 NOTE — Progress Notes (Signed)
Subjective:  Patient ID: Margaret Fletcher, female    DOB: [birth year]  Age: 44 y.o. MRN: [id number]  CC: Cyst and Flu Vaccine   HPI Margaret Fletcher presents  her pubis. She has no fever chills. No drainage. She has a history of prior abscess there with a keloid formation she said now it is acutely tender  History Margaret Fletcher has a past medical history of Alopecia; Autoimmune disease; Bacterial vaginosis; Migraine headache; Frequent UTI; and Cyst of spleen.   She has past surgical history that includes Cesarean section; Cholecystectomy; Appendectomy; Cesarean section; Wisdom tooth extraction; Tubal ligation; Dilatation & currettage/hysteroscopy with novasure ablation (N/A, 07/19/2012); Endometrial ablation; and Breath tek h pylori (N/A, 05/21/2014).   Her  family history includes Cancer in her mother; Diabetes in her brother; Stroke in her father.  She   reports that she has never smoked. She has never used smokeless tobacco. She reports that she drinks alcohol. She reports that she does not use illicit drugs.  Outpatient Prescriptions Prior to Visit  Medication Sig Dispense Refill  . BIOTIN PO Take 1 tablet by mouth daily.     Marland Kitchen buPROPion (WELLBUTRIN) 75 MG tablet Take 75 mg by mouth 2 (two) times daily.    Marland Kitchen CALCIUM PO Take 1 tablet by mouth daily.     . Cyanocobalamin (VITAMIN B-12 PO) Take 1 tablet by mouth daily.     Marland Kitchen MAGNESIUM PO Take 1 tablet by mouth daily.     . Multiple Vitamins-Minerals (ZINC PO) Take 1 tablet by mouth daily.     . Pyridoxine HCl (VITAMIN B-6 PO) Take 1 tablet by mouth daily.     . SUMAtriptan (IMITREX) 25 MG tablet Take 25 mg by mouth every 2 (two) hours as needed. For migraines    . fluconazole (DIFLUCAN) 150 MG tablet Take 1 tablet (150 mg total) by mouth once. Repeat if needed (Patient not taking: Reported on 05/24/2014) 2 tablet 0   No facility-administered medications prior to visit.    Social History   Social History  . Marital Status: Divorced      Spouse Name: N/A  . Number of Children: N/A  . Years of Education: N/A   Social History Main Topics  . Smoking status: Never Smoker   . Smokeless tobacco: Never Used  . Alcohol Use: Yes     Comment: socially  . Drug Use: No  . Sexual Activity: Yes    Birth Control/ Protection: Surgical   Other Topics Concern  . None   Social History Narrative     Review of Systems  Constitutional: Negative for fever, chills and appetite change.  HENT: Negative for congestion, ear pain, postnasal drip, sinus pressure and sore throat.   Eyes: Negative for pain and redness.  Respiratory: Negative for cough, shortness of breath and wheezing.   Cardiovascular: Negative for leg swelling.  Gastrointestinal: Negative for nausea, vomiting, abdominal pain, diarrhea, constipation and blood in stool.  Endocrine: Negative for polyuria.  Genitourinary: Negative for dysuria, urgency, frequency and flank pain.  Musculoskeletal: Negative for gait problem.  Skin: Positive for color change. Negative for rash.  Neurological: Negative for weakness and headaches.  Psychiatric/Behavioral: Negative for confusion and decreased concentration. The patient is not nervous/anxious.     Objective:  BP 122/80 mmHg  Pulse 86  Temp(Src) 97.7 F (36.5 C) (Oral)  Resp 16  Ht 5' 8.25" (1.734 m)  Wt 279 lb (126.554 kg)  BMI 42.09 kg/m2  SpO2 95%  Physical Exam  Constitutional: She is oriented to person, place, and time. She appears well-developed and well-nourished.  HENT:  Head: Normocephalic and atraumatic.  Eyes: Conjunctivae are normal. Pupils are equal, round, and reactive to light.  Pulmonary/Chest: Effort normal.  Musculoskeletal: She exhibits no edema.  Neurological: She is alert and oriented to person, place, and time.  Skin: Skin is dry. Lesion and rash noted. Rash is macular, papular and pustular.  Psychiatric: She has a normal mood and affect. Her behavior is normal. Thought content normal.       Assessment & Plan:   Margaret Fletcher was seen today for cyst and flu vaccine.  Diagnoses and all orders for this visit:  Cellulitis and abscess of trunk  Other orders -     doxycycline (VIBRAMYCIN) 100 MG capsule; Take 1 capsule (100 mg total) by mouth 2 (two) times daily. -     fluconazole (DIFLUCAN) 150 MG tablet; Take 1 tablet (150 mg total) by mouth once. Repeat if needed   I have discontinued Margaret Fletcher's fluconazole. I am also having her start on doxycycline and fluconazole. Additionally, I am having her maintain her SUMAtriptan, MAGNESIUM PO, Multiple Vitamins-Minerals (ZINC PO), CALCIUM PO, BIOTIN PO, Cyanocobalamin (VITAMIN B-12 PO), Pyridoxine HCl (VITAMIN B-6 PO), buPROPion, and diphenhydrAMINE.  Meds ordered this encounter  Medications  . diphenhydrAMINE (BENADRYL) 25 MG tablet    Sig: Take 25 mg by mouth every 6 (six) hours as needed.  . doxycycline (VIBRAMYCIN) 100 MG capsule    Sig: Take 1 capsule (100 mg total) by mouth 2 (two) times daily.    Dispense:  20 capsule    Refill:  0  . fluconazole (DIFLUCAN) 150 MG tablet    Sig: Take 1 tablet (150 mg total) by mouth once. Repeat if needed    Dispense:  2 tablet    Refill:  0   The abscess is not at a point where he needs to be drained so she was put on antibiotic so follow-up.  Appropriate red flag conditions were discussed with the patient as well as actions that should be taken.  Patient expressed his understanding.  Follow-up: Return if symptoms worsen or fail to improve.  Roselee Culver, MD

## 2015-01-03 ENCOUNTER — Ambulatory Visit (INDEPENDENT_AMBULATORY_CARE_PROVIDER_SITE_OTHER): Payer: 59

## 2015-01-03 DIAGNOSIS — Z23 Encounter for immunization: Secondary | ICD-10-CM

## 2015-03-10 DIAGNOSIS — G932 Benign intracranial hypertension: Secondary | ICD-10-CM

## 2015-03-10 HISTORY — PX: REDUCTION MAMMAPLASTY: SUR839

## 2015-03-10 HISTORY — PX: BREAST EXCISIONAL BIOPSY: SUR124

## 2015-03-10 HISTORY — DX: Benign intracranial hypertension: G93.2

## 2015-03-14 ENCOUNTER — Other Ambulatory Visit: Payer: Self-pay

## 2015-03-14 DIAGNOSIS — Z1231 Encounter for screening mammogram for malignant neoplasm of breast: Secondary | ICD-10-CM

## 2015-03-19 ENCOUNTER — Ambulatory Visit
Admission: RE | Admit: 2015-03-19 | Discharge: 2015-03-19 | Disposition: A | Discharge status: Home / Self Care | Payer: 59 | Source: Ambulatory Visit

## 2015-03-19 DIAGNOSIS — Z1231 Encounter for screening mammogram for malignant neoplasm of breast: Secondary | ICD-10-CM

## 2015-03-19 DIAGNOSIS — N62 Hypertrophy of breast: Secondary | ICD-10-CM | POA: Diagnosis not present

## 2015-03-25 ENCOUNTER — Encounter (HOSPITAL_BASED_OUTPATIENT_CLINIC_OR_DEPARTMENT_OTHER): Payer: Self-pay | Admitting: *Deleted

## 2015-03-25 ENCOUNTER — Ambulatory Visit: Payer: Self-pay | Admitting: Plastic Surgery

## 2015-03-25 MED FILL — predniSONE 5 MG (21) TBPK: 5 | 6 days supply | Qty: 21 | Fill #0

## 2015-03-25 MED FILL — CEFADROXIL 500 MG CAPSULE: 500 | 7 days supply | Qty: 14 | Fill #0

## 2015-03-25 MED FILL — OXYCODONE/APAP 5/325MG: 5-325 | 2 days supply | Qty: 30 | Fill #0

## 2015-03-26 ENCOUNTER — Ambulatory Visit (HOSPITAL_BASED_OUTPATIENT_CLINIC_OR_DEPARTMENT_OTHER): Payer: 59 | Admitting: Certified Registered"

## 2015-03-26 ENCOUNTER — Encounter (HOSPITAL_BASED_OUTPATIENT_CLINIC_OR_DEPARTMENT_OTHER): Payer: Self-pay | Admitting: *Deleted

## 2015-03-26 ENCOUNTER — Encounter (HOSPITAL_BASED_OUTPATIENT_CLINIC_OR_DEPARTMENT_OTHER)
Admission: RE | Disposition: A | Discharge status: Home / Self Care | Payer: Self-pay | Source: Ambulatory Visit | Attending: Plastic Surgery

## 2015-03-26 ENCOUNTER — Ambulatory Visit (HOSPITAL_BASED_OUTPATIENT_CLINIC_OR_DEPARTMENT_OTHER)
Admission: RE | Admit: 2015-03-26 | Discharge: 2015-03-26 | Disposition: A | Discharge status: Home / Self Care | Payer: 59 | Source: Ambulatory Visit | Attending: Plastic Surgery | Admitting: Plastic Surgery

## 2015-03-26 DIAGNOSIS — N6011 Diffuse cystic mastopathy of right breast: Secondary | ICD-10-CM | POA: Diagnosis not present

## 2015-03-26 DIAGNOSIS — Z6841 Body Mass Index (BMI) 40.0 and over, adult: Secondary | ICD-10-CM | POA: Diagnosis not present

## 2015-03-26 DIAGNOSIS — N6012 Diffuse cystic mastopathy of left breast: Secondary | ICD-10-CM | POA: Diagnosis not present

## 2015-03-26 DIAGNOSIS — N62 Hypertrophy of breast: Secondary | ICD-10-CM | POA: Diagnosis not present

## 2015-03-26 HISTORY — DX: Depression, unspecified: F32.A

## 2015-03-26 HISTORY — DX: Hypertrophy of breast: N62

## 2015-03-26 HISTORY — DX: Major depressive disorder, single episode, unspecified: F32.9

## 2015-03-26 HISTORY — PX: BREAST REDUCTION SURGERY: SHX8

## 2015-03-26 SURGERY — MAMMOPLASTY, REDUCTION
Anesthesia: General | Site: Breast | Laterality: Bilateral

## 2015-03-26 MED ORDER — EPHEDRINE SULFATE 50 MG/ML IJ SOLN
INTRAMUSCULAR | Status: AC
  Filled 2015-03-26: qty 1

## 2015-03-26 MED ORDER — LIDOCAINE HCL (CARDIAC) 20 MG/ML IV SOLN
INTRAVENOUS | Status: AC
  Filled 2015-03-26: qty 5

## 2015-03-26 MED ORDER — 0.9 % SODIUM CHLORIDE (POUR BTL) OPTIME
TOPICAL | Status: DC | PRN
Start: 2015-03-26 — End: 2015-03-26
  Administered 2015-03-26 (×2): 1000 mL

## 2015-03-26 MED ORDER — LIDOCAINE HCL (PF) 1 % IJ SOLN
INTRAMUSCULAR | Status: AC
  Filled 2015-03-26: qty 30

## 2015-03-26 MED ORDER — GLYCOPYRROLATE 0.2 MG/ML IJ SOLN
0.2000 mg | Freq: Once | INTRAMUSCULAR | Status: AC | PRN
  Administered 2015-03-26: 0.2 mg via INTRAVENOUS

## 2015-03-26 MED ORDER — PHENYLEPHRINE HCL 10 MG/ML IJ SOLN
INTRAMUSCULAR | Status: AC
Start: 2015-03-26 — End: 2015-03-26
  Filled 2015-03-26: qty 1

## 2015-03-26 MED ORDER — LIDOCAINE HCL (CARDIAC) 20 MG/ML IV SOLN
INTRAVENOUS | Status: DC | PRN
  Administered 2015-03-26: 50 mg via INTRAVENOUS

## 2015-03-26 MED ORDER — ESMOLOL HCL 100 MG/10ML IV SOLN
INTRAVENOUS | Status: AC
  Filled 2015-03-26: qty 10

## 2015-03-26 MED ORDER — CEFAZOLIN SODIUM-DEXTROSE 2-3 GM-% IV SOLR
INTRAVENOUS | Status: AC
  Filled 2015-03-26: qty 50

## 2015-03-26 MED ORDER — MIDAZOLAM HCL 2 MG/2ML IJ SOLN
INTRAMUSCULAR | Status: AC
  Filled 2015-03-26: qty 2

## 2015-03-26 MED ORDER — SODIUM CHLORIDE 0.9 % IV SOLN
INTRAVENOUS | Status: DC | PRN
  Administered 2015-03-26: 100 mL

## 2015-03-26 MED ORDER — DEXAMETHASONE SODIUM PHOSPHATE 10 MG/ML IJ SOLN
INTRAMUSCULAR | Status: AC
  Filled 2015-03-26: qty 1

## 2015-03-26 MED ORDER — SODIUM CHLORIDE 0.9 % IN NEBU
INHALATION_SOLUTION | RESPIRATORY_TRACT | Status: AC
  Filled 2015-03-26: qty 3

## 2015-03-26 MED ORDER — PROPOFOL 10 MG/ML IV BOLUS
INTRAVENOUS | Status: AC
  Filled 2015-03-26: qty 40

## 2015-03-26 MED ORDER — PROMETHAZINE HCL 25 MG/ML IJ SOLN
6.2500 mg | INTRAMUSCULAR | Status: DC | PRN
  Administered 2015-03-26: 6.25 mg via INTRAVENOUS

## 2015-03-26 MED ORDER — BUPIVACAINE LIPOSOME 1.3 % IJ SUSP
INTRAMUSCULAR | Status: AC
  Filled 2015-03-26: qty 20

## 2015-03-26 MED ORDER — HYDROMORPHONE HCL 1 MG/ML IJ SOLN
INTRAMUSCULAR | Status: AC
  Filled 2015-03-26: qty 1

## 2015-03-26 MED ORDER — MIDAZOLAM HCL 2 MG/2ML IJ SOLN: 1.0000 mg | INTRAMUSCULAR | Status: DC | PRN

## 2015-03-26 MED ORDER — FENTANYL CITRATE (PF) 100 MCG/2ML IJ SOLN
INTRAMUSCULAR | Status: AC
  Filled 2015-03-26: qty 2

## 2015-03-26 MED ORDER — PROPOFOL 10 MG/ML IV BOLUS
INTRAVENOUS | Status: DC | PRN
  Administered 2015-03-26 (×2): 100 mg via INTRAVENOUS
  Administered 2015-03-26: 200 mg via INTRAVENOUS
  Administered 2015-03-26 (×2): 100 mg via INTRAVENOUS

## 2015-03-26 MED ORDER — DEXAMETHASONE SODIUM PHOSPHATE 4 MG/ML IJ SOLN
INTRAMUSCULAR | Status: DC | PRN
  Administered 2015-03-26: 10 mg via INTRAVENOUS

## 2015-03-26 MED ORDER — PHENYLEPHRINE HCL 10 MG/ML IJ SOLN
INTRAMUSCULAR | Status: DC | PRN
  Administered 2015-03-26 (×2): 40 ug via INTRAVENOUS
  Administered 2015-03-26 (×2): 80 ug via INTRAVENOUS
  Administered 2015-03-26: 40 ug via INTRAVENOUS
  Administered 2015-03-26: 80 ug via INTRAVENOUS

## 2015-03-26 MED ORDER — LACTATED RINGERS IV SOLN
INTRAVENOUS | Status: DC
  Administered 2015-03-26 (×3): via INTRAVENOUS

## 2015-03-26 MED ORDER — PROMETHAZINE HCL 25 MG/ML IJ SOLN
INTRAMUSCULAR | Status: AC
  Filled 2015-03-26: qty 1

## 2015-03-26 MED ORDER — SUCCINYLCHOLINE CHLORIDE 20 MG/ML IJ SOLN
INTRAMUSCULAR | Status: AC
  Filled 2015-03-26: qty 1

## 2015-03-26 MED ORDER — BACITRACIN ZINC 500 UNIT/GM EX OINT
TOPICAL_OINTMENT | CUTANEOUS | Status: AC
  Filled 2015-03-26: qty 28.35

## 2015-03-26 MED ORDER — FENTANYL CITRATE (PF) 100 MCG/2ML IJ SOLN: 50.0000 ug | INTRAMUSCULAR | Status: DC | PRN

## 2015-03-26 MED ORDER — FENTANYL CITRATE (PF) 100 MCG/2ML IJ SOLN
INTRAMUSCULAR | Status: DC | PRN
  Administered 2015-03-26 (×4): 25 ug via INTRAVENOUS
  Administered 2015-03-26: 50 ug via INTRAVENOUS
  Administered 2015-03-26 (×2): 25 ug via INTRAVENOUS
  Administered 2015-03-26 (×2): 50 ug via INTRAVENOUS
  Administered 2015-03-26: 100 ug via INTRAVENOUS
  Administered 2015-03-26: 25 ug via INTRAVENOUS

## 2015-03-26 MED ORDER — BACITRACIN ZINC 500 UNIT/GM EX OINT
TOPICAL_OINTMENT | CUTANEOUS | Status: DC | PRN
  Administered 2015-03-26: 1 via TOPICAL

## 2015-03-26 MED ORDER — MIDAZOLAM HCL 5 MG/5ML IJ SOLN
INTRAMUSCULAR | Status: DC | PRN
  Administered 2015-03-26: 2 mg via INTRAVENOUS

## 2015-03-26 MED ORDER — HYDROMORPHONE HCL 1 MG/ML IJ SOLN
0.2500 mg | INTRAMUSCULAR | Status: DC | PRN
  Administered 2015-03-26 (×3): 0.5 mg via INTRAVENOUS

## 2015-03-26 MED ORDER — LIDOCAINE-EPINEPHRINE 1 %-1:100000 IJ SOLN
INTRAMUSCULAR | Status: DC | PRN
  Administered 2015-03-26: 40 mL

## 2015-03-26 MED ORDER — GLYCOPYRROLATE 0.2 MG/ML IJ SOLN
INTRAMUSCULAR | Status: AC
  Filled 2015-03-26: qty 1

## 2015-03-26 MED ORDER — LIDOCAINE-EPINEPHRINE 1 %-1:100000 IJ SOLN
INTRAMUSCULAR | Status: AC
  Filled 2015-03-26: qty 2

## 2015-03-26 MED ORDER — ATROPINE SULFATE 0.4 MG/ML IJ SOLN
INTRAMUSCULAR | Status: AC
  Filled 2015-03-26: qty 1

## 2015-03-26 MED ORDER — CEFAZOLIN SODIUM-DEXTROSE 2-3 GM-% IV SOLR
2.0000 g | INTRAVENOUS | Status: AC
  Administered 2015-03-26: 3 g via INTRAVENOUS

## 2015-03-26 MED ORDER — BACITRACIN ZINC 500 UNIT/GM EX OINT
TOPICAL_OINTMENT | CUTANEOUS | Status: AC
  Filled 2015-03-26: qty 0.9

## 2015-03-26 MED ORDER — SCOPOLAMINE 1 MG/3DAYS TD PT72: 1.0000 | MEDICATED_PATCH | Freq: Once | TRANSDERMAL | Status: DC

## 2015-03-26 MED ORDER — PROPOFOL 500 MG/50ML IV EMUL
INTRAVENOUS | Status: AC
  Filled 2015-03-26: qty 50

## 2015-03-26 MED ORDER — ONDANSETRON HCL 4 MG/2ML IJ SOLN
INTRAMUSCULAR | Status: DC | PRN
  Administered 2015-03-26: 4 mg via INTRAVENOUS

## 2015-03-26 MED ORDER — PHENYLEPHRINE HCL 10 MG/ML IJ SOLN
INTRAMUSCULAR | Status: AC
  Filled 2015-03-26: qty 1

## 2015-03-26 MED ORDER — SODIUM CHLORIDE 0.9 % IJ SOLN
INTRAMUSCULAR | Status: AC
  Filled 2015-03-26: qty 10

## 2015-03-26 MED ORDER — EPHEDRINE SULFATE 50 MG/ML IJ SOLN
INTRAMUSCULAR | Status: DC | PRN
  Administered 2015-03-26: 10 mg via INTRAVENOUS

## 2015-03-26 MED ORDER — SUCCINYLCHOLINE CHLORIDE 20 MG/ML IJ SOLN
INTRAMUSCULAR | Status: DC | PRN
  Administered 2015-03-26: 50 mg via INTRAVENOUS

## 2015-03-26 MED ORDER — CEFAZOLIN SODIUM 1-5 GM-% IV SOLN
INTRAVENOUS | Status: AC
  Filled 2015-03-26: qty 50

## 2015-03-26 MED ORDER — HYDROCODONE-ACETAMINOPHEN 7.5-325 MG PO TABS: 1.0000 | ORAL_TABLET | Freq: Once | ORAL | Status: DC | PRN

## 2015-03-26 MED ORDER — PHENYLEPHRINE HCL 10 MG/ML IJ SOLN
10.0000 mg | INTRAVENOUS | Status: DC | PRN
  Administered 2015-03-26: 10 ug/min via INTRAVENOUS

## 2015-03-26 MED ORDER — ONDANSETRON HCL 4 MG/2ML IJ SOLN
INTRAMUSCULAR | Status: AC
  Filled 2015-03-26: qty 2

## 2015-03-26 SURGICAL SUPPLY — 59 items
BAG DECANTER FOR FLEXI CONT (MISCELLANEOUS) IMPLANT
BENZOIN TINCTURE PRP APPL 2/3 (GAUZE/BANDAGES/DRESSINGS) ×4 IMPLANT
BLADE KNIFE PERSONA 10 (BLADE) ×8 IMPLANT
BLADE KNIFE PERSONA 15 (BLADE) ×6 IMPLANT
BNDG GAUZE ELAST 4 BULKY (GAUZE/BANDAGES/DRESSINGS) ×4 IMPLANT
CANISTER SUCT 1200ML W/VALVE (MISCELLANEOUS) ×2 IMPLANT
CAP BOUFFANT 24 BLUE NURSES (PROTECTIVE WEAR) ×2 IMPLANT
COVER BACK TABLE 60X90IN (DRAPES) ×2 IMPLANT
COVER MAYO STAND STRL (DRAPES) ×2 IMPLANT
DECANTER SPIKE VIAL GLASS SM (MISCELLANEOUS) ×4 IMPLANT
DRAIN CHANNEL 10F 3/8 F FF (DRAIN) ×4 IMPLANT
DRAPE LAPAROSCOPIC ABDOMINAL (DRAPES) IMPLANT
DRAPE U-SHAPE 76X120 STRL (DRAPES) ×4 IMPLANT
DRSG EMULSION OIL 3X3 NADH (GAUZE/BANDAGES/DRESSINGS) ×4 IMPLANT
DRSG PAD ABDOMINAL 8X10 ST (GAUZE/BANDAGES/DRESSINGS) ×4 IMPLANT
ELECT REM PT RETURN 9FT ADLT (ELECTROSURGICAL) ×2
ELECTRODE REM PT RTRN 9FT ADLT (ELECTROSURGICAL) ×1 IMPLANT
EVACUATOR SILICONE 100CC (DRAIN) ×4 IMPLANT
FILTER 7/8 IN (FILTER) IMPLANT
GAUZE SPONGE 4X4 12PLY STRL (GAUZE/BANDAGES/DRESSINGS) ×4 IMPLANT
GLOVE BIO SURGEON STRL SZ7 (GLOVE) ×2 IMPLANT
GLOVE BIOGEL PI IND STRL 7.0 (GLOVE) ×2 IMPLANT
GLOVE BIOGEL PI INDICATOR 7.0 (GLOVE) ×2
GLOVE ECLIPSE 6.5 STRL STRAW (GLOVE) ×4 IMPLANT
GOWN STRL REUS W/ TWL LRG LVL3 (GOWN DISPOSABLE) ×2 IMPLANT
GOWN STRL REUS W/TWL LRG LVL3 (GOWN DISPOSABLE) ×2
IV NS 250ML (IV SOLUTION) ×1
IV NS 250ML BAXH (IV SOLUTION) ×1 IMPLANT
NDL SAFETY ECLIPSE 18X1.5 (NEEDLE) ×1 IMPLANT
NEEDLE HYPO 18GX1.5 SHARP (NEEDLE) ×1
NEEDLE HYPO 25X1 1.5 SAFETY (NEEDLE) ×6 IMPLANT
NEEDLE SPNL 18GX3.5 QUINCKE PK (NEEDLE) ×2 IMPLANT
NS IRRIG 1000ML POUR BTL (IV SOLUTION) ×4 IMPLANT
PACK BASIN DAY SURGERY FS (CUSTOM PROCEDURE TRAY) ×2 IMPLANT
PIN SAFETY STERILE (MISCELLANEOUS) ×2 IMPLANT
SCRUB PCMX 4 OZ (MISCELLANEOUS) ×2 IMPLANT
SLEEVE SCD COMPRESS KNEE MED (MISCELLANEOUS) ×2 IMPLANT
SPECIMEN JAR MEDIUM (MISCELLANEOUS) ×4 IMPLANT
SPECIMEN JAR X LARGE (MISCELLANEOUS) IMPLANT
SPONGE LAP 18X18 X RAY DECT (DISPOSABLE) ×8 IMPLANT
STAPLER VISISTAT 35W (STAPLE) ×4 IMPLANT
STRIP CLOSURE SKIN 1/2X4 (GAUZE/BANDAGES/DRESSINGS) ×8 IMPLANT
SUT ETHILON 3 0 PS 1 (SUTURE) ×2 IMPLANT
SUT MNCRL AB 3-0 PS2 18 (SUTURE) ×10 IMPLANT
SUT MNCRL AB 4-0 PS2 18 (SUTURE) ×4 IMPLANT
SUT MON AB 5-0 PS2 18 (SUTURE) ×4 IMPLANT
SUT PROLENE 2 0 CT2 30 (SUTURE) ×2 IMPLANT
SUT PROLENE 3 0 PS 1 (SUTURE) ×4 IMPLANT
SUT QUILL PDO 2-0 (SUTURE) ×4 IMPLANT
SYR BULB IRRIGATION 50ML (SYRINGE) ×4 IMPLANT
SYR CONTROL 10ML LL (SYRINGE) ×4 IMPLANT
TOWEL OR 17X24 6PK STRL BLUE (TOWEL DISPOSABLE) ×6 IMPLANT
TOWEL OR NON WOVEN STRL DISP B (DISPOSABLE) IMPLANT
TRAY DSU PREP LF (CUSTOM PROCEDURE TRAY) ×2 IMPLANT
TRAY FOLEY CATH SILVER 16FR (SET/KITS/TRAYS/PACK) ×2 IMPLANT
TUBE CONNECTING 20X1/4 (TUBING) ×2 IMPLANT
UNDERPAD 30X30 (UNDERPADS AND DIAPERS) ×4 IMPLANT
VAC PENCILS W/TUBING CLEAR (MISCELLANEOUS) ×2 IMPLANT
YANKAUER SUCT BULB TIP NO VENT (SUCTIONS) ×2 IMPLANT

## 2015-03-26 NOTE — Transfer of Care (Signed)
Immediate Anesthesia Transfer of Care Note  Patient: Margaret Fletcher  Procedure(s) Performed: Procedure(s): BILATERAL BREAST REDUCTION   (Bilateral)  Patient Location: PACU  Anesthesia Type:General  Level of Consciousness: sedated  Airway & Oxygen Therapy: Patient Spontanous Breathing and Patient connected to face mask oxygen  Post-op Assessment: Report given to RN and Post -op Vital signs reviewed and stable  Post vital signs: Reviewed and stable  Last Vitals:  Filed Vitals:   03/26/15 0646  BP: 129/93  Pulse: 68  Temp: 36.6 C  Resp: 18    Complications: No apparent anesthesia complications

## 2015-03-26 NOTE — Op Note (Signed)
OPERATIVE REPORT   PREOPERATIVE DIAGNOSIS: Bilateral macromastia.  POSTOPERATIVE DIAGNOSIS: Bilateral macromastia.  PROCEDURE: Bilateral reduction mammoplasties.  ATTENDING SURGEON: Hetty Blend, M.D.  ANESTHESIA: General.  COMPLICATIONS: None.  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 She presents to undergo bilateral reduction mammoplasties.  DESCRIPTION OF PROCEDURE: The patient was marked in the preop holding area in the pattern of Wise for the future bilateral reduction mammoplasties. She was then taken back to the OR, placed on the table in supine position. After adequate general anesthesia was obtained, the patient's chest was prepped with Techni-Care, and draped in sterile fashion. The bases of the breasts had been injected with 1% lidocaine with epinephrine. After adequate hemostasis and anesthesia taken effect, the procedure was begun.  Both of the reductions were performed in the following similar manner. The nipple-areolar complex was marked on the breast mound with a 45 mm nipple marker. The skin was then incised and de-epithelialized around the nipple-areolar complex down to the inframammary crease in the inferior pedicle pattern. Next, the medial, superior, and lateral skin flaps were elevated down to the chest wall. The excess fat and glandular tissue were removed from the inferior pedicle. The nipple- areolar complex was examined and found to be pink and viable. The wound was irrigated with saline irrigation. Meticulous hemostasis was obtained with the Bovie electrocautery. The inferior pedicle was centralized using 3-0 Prolene suture. A #10 JP flat fully fluted drain was placed into the wound. The skin flaps brought together at the inverted T junction with a 2-0 Prolene suture. The incisions were  then stapled for temporary closure. The breasts were compared and found to have good shape and symmetry. The incisions were then closed from the medial aspect of the JP drain to the medial aspect of the [facility] incision by first placing a few 3-0 Monocryl sutures to tack together the dermal layer, and then both the dermal and cuticular layer were closed in a single layer using a 2-0 Quill PDO barbed suture. Lateral to the JP drain, the incision was closed using 3-0 nylon in the dermal layer followed by 3-0 nylon running intracuticular stitch on the skin. The vertical limb of the Wise pattern was then closed using 3-0 Monocryl in the dermal layer.  The patient was placed in the upright position. The future location of the nipple-areolar complexes was marked on both breast mounds using the 45 mm nipple marker. She was then placed back into the recumbent position.  Both of the nipple-areolar complexes were brought out onto the breast mounds in the following similar manner. The skin was incised as marked and carried down full thickness into the subcutaneous tissues. The nipple-areolar complex was examined, found to be pink and viable, then brought out through this aperture and sewn in place using 4-0 Monocryl in the dermal layer, followed by a 5-0 Monocryl running intracuticular stitch on the skin. The vertical limb of the Wise pattern was then closed with 5-0 Monocryl in the cuticular layer and a running intracuticular stitch in continuity with the nipple-areolar closure. The JP drain was sewn in place using 3-0 nylon suture. Prior to closing the breast wound, the skin and soft tissues as well as the chest wall musculature were injected with 1.3% Exparel (total 266 mgs.) and all of the [facility] incisions were also injected with the Exparel to provide postoperative pain control for the patient. The incisions were dressed with benzoin and Steri-Strips,  and the nipples additionally with  bacitracin ointment and Adaptic. 4x4s were placed over the incisions and ABD pads in the axillary areas. There are no complications. The patient tolerated the procedure well. The final needle and sponge counts were reported to be correct at the end of the case.  The patient was then awakened from the general anesthesia and taken to the PACU in stable condition. She was then recovered without complications. Both the patient and her family were given proper postoperative wound care instructions including care of the JP drains. She was then discharged home in the care of her family in stable condition. Followup appointment will be within a few days in the office.     ______________________________ Hetty Blend, M.D.

## 2015-03-26 NOTE — Anesthesia Procedure Notes (Signed)
Procedure Name: Intubation Date/Time: 03/26/2015 7:58 AM Performed by: Marrianne Mood Pre-anesthesia Checklist: Patient identified, Emergency Drugs available, Suction available, Patient being monitored and Timeout performed Patient Re-evaluated:Patient Re-evaluated prior to inductionOxygen Delivery Method: Circle System Utilized Preoxygenation: Pre-oxygenation with 100% oxygen Intubation Type: IV induction Ventilation: Mask ventilation without difficulty Laryngoscope Size: Miller and 3 Grade View: Grade III Tube type: Oral Tube size: 7.0 mm Number of attempts: 1 Airway Equipment and Method: Stylet and Oral airway Placement Confirmation: ETT inserted through vocal cords under direct vision,  positive ETCO2 and breath sounds checked- equal and bilateral Secured at: 23 cm Tube secured with: Tape Dental Injury: Teeth and Oropharynx as per pre-operative assessment

## 2015-03-26 NOTE — Discharge Instructions (Signed)
1. No lifting greater than 5 lbs with arms for 4 weeks. 2. Empty, strip, record and reactivate JP drains 3 times a day. 3. Percocet 5/325 mg tabs 1-2 tabs po q 4-6 hours prn pain- prescription given in office. 4. Duricef 1 tab po bid- prescription given in office. 5. Sterapred dose pack as directed- prescription given in office. 6. Follow-up appointment Friday in office.  About my Jackson-Pratt Bulb Drain  What is a Jackson-Pratt bulb? A Jackson-Pratt is a soft, round device used to collect drainage. It is connected to a long, thin drainage catheter, which is held in place by one or two small stiches near your surgical incision site. When the bulb is squeezed, it forms a vacuum, forcing the drainage to empty into the bulb.  Emptying the Jackson-Pratt bulb- To empty the bulb: 1. Release the plug on the top of the bulb. 2. Pour the bulb's contents into a measuring container which your nurse will provide. 3. Record the time emptied and amount of drainage. Empty the drain(s) as often as your     doctor or nurse recommends.  Date                  Time                    Amount (Drain 1)                 Amount (Drain 2)  _____________________________________________________________________  _____________________________________________________________________  _____________________________________________________________________  _____________________________________________________________________  _____________________________________________________________________  _____________________________________________________________________  _____________________________________________________________________  _____________________________________________________________________  Squeezing the Jackson-Pratt Bulb- To squeeze the bulb: 1. Make sure the plug at the top of the bulb is open. 2. Squeeze the bulb tightly in your fist. You will hear air squeezing from the bulb. 3. Replace the  plug while the bulb is squeezed. 4. Use a safety pin to attach the bulb to your clothing. This will keep the catheter from     pulling at the bulb insertion site.  When to call your doctor- Call your doctor if:  Drain site becomes red, swollen or hot.  You have a fever greater than 101 degrees F.  There is oozing at the drain site.  Drain falls out (apply a guaze bandage over the drain hole and secure it with tape).  Drainage increases daily not related to activity patterns. (You will usually have more drainage when you are active than when you are resting.)  Drainage has a bad odor.   Post Anesthesia Home Care Instructions  Activity: Get plenty of rest for the remainder of the day. A responsible adult should stay with you for 24 hours following the procedure.  For the next 24 hours, DO NOT: -Drive a car -Paediatric nurse -Drink alcoholic beverages -Take any medication unless instructed by your physician -Make any legal decisions or sign important papers.  Meals: Start with liquid foods such as gelatin or soup. Progress to regular foods as tolerated. Avoid greasy, spicy, heavy foods. If nausea and/or vomiting occur, drink only clear liquids until the nausea and/or vomiting subsides. Call your physician if vomiting continues.  Special Instructions/Symptoms: Your throat may feel dry or sore from the anesthesia or the breathing tube placed in your throat during surgery. If this causes discomfort, gargle with warm salt water. The discomfort should disappear within 24 hours.  If you had a scopolamine patch placed behind your ear for the management of post- operative nausea and/or vomiting:  1. The medication in the patch is effective  for 72 hours, after which it should be removed.  Wrap patch in a tissue and discard in the trash. Wash hands thoroughly with soap and water. 2. You may remove the patch earlier than 72 hours if you experience unpleasant side effects which may include  dry mouth, dizziness or visual disturbances. 3. Avoid touching the patch. Wash your hands with soap and water after contact with the patch.

## 2015-03-26 NOTE — Progress Notes (Signed)
Patient had nausea in PACU.  Phenergan given with relief.  Patient wants nausea to be called into pharmacy prior to going home.  MD called for nausea medication per patient request. Patient states she gets nauseous when taking narcotic pain meds.  MD refused to call in nausea medication.  Patient instructed that if she gets nauseous at home to call MD for medication.

## 2015-03-26 NOTE — Anesthesia Postprocedure Evaluation (Signed)
Anesthesia Post Note  Patient: Margaret Fletcher  Procedure(s) Performed: Procedure(s) (LRB): BILATERAL BREAST REDUCTION   (Bilateral)  Patient location during evaluation: PACU Anesthesia Type: General Level of consciousness: awake and alert Pain management: pain level controlled Vital Signs Assessment: post-procedure vital signs reviewed and stable Respiratory status: spontaneous breathing, nonlabored ventilation, respiratory function stable and patient connected to nasal cannula oxygen Cardiovascular status: blood pressure returned to baseline and stable Postop Assessment: no signs of nausea or vomiting Anesthetic complications: no    Last Vitals:  Filed Vitals:   03/26/15 0646 03/26/15 1255  BP: 129/93 123/76  Pulse: 68 102  Temp: 36.6 C 37 C  Resp: 18 20    Last Pain: There were no vitals filed for this visit.               [name]

## 2015-03-26 NOTE — H&P (Signed)
  H&P faxed to surgical center.  -History and Physical Reviewed  -Patient has been re-examined  -No change in the plan of care  CONTOGIANNIS,MARY A    

## 2015-03-26 NOTE — Anesthesia Preprocedure Evaluation (Addendum)
Anesthesia Evaluation  Patient identified by MRN, date of birth, ID band Patient awake    Reviewed: Allergy & Precautions, NPO status , Patient's Chart, lab work & pertinent test results  Airway Mallampati: I  TM Distance: >3 FB Neck ROM: Full    Dental  (+) Dental Advisory Given   Pulmonary neg pulmonary ROS,    breath sounds clear to auscultation       Cardiovascular negative cardio ROS   Rhythm:Regular Rate:Normal     Neuro/Psych  Headaches, Depression    GI/Hepatic negative GI ROS, Neg liver ROS,   Endo/Other  Morbid obesity  Renal/GU negative Renal ROS     Musculoskeletal   Abdominal   Peds  Hematology negative hematology ROS (+)   Anesthesia Other Findings   Reproductive/Obstetrics                            Lab Results  Component Value Date   WBC 9.7 10/07/2013   HGB 12.8 10/07/2013   HCT 39.3 10/07/2013   MCV 93.1 10/07/2013   PLT 312 10/07/2013   Lab Results  Component Value Date   CREATININE 1.11* 03/26/2014   BUN 12 03/26/2014   NA 139 03/26/2014   K 4.5 03/26/2014   CL 104 03/26/2014   CO2 26 03/26/2014    Anesthesia Physical Anesthesia Plan  ASA: III  Anesthesia Plan: General   Post-op Pain Management:    Induction: Intravenous  Airway Management Planned: Oral ETT  Additional Equipment:   Intra-op Plan:   Post-operative Plan: Extubation in OR  Informed Consent: I have reviewed the patients History and Physical, chart, labs and discussed the procedure including the risks, benefits and alternatives for the proposed anesthesia with the patient or authorized representative who has indicated his/her understanding and acceptance.   Dental advisory given  Plan Discussed with: CRNA  Anesthesia Plan Comments:        Anesthesia Quick Evaluation

## 2015-03-26 NOTE — Brief Op Note (Signed)
03/26/2015  12:57 PM  PATIENT:  Margaret Fletcher  45 y.o. female  PRE-OPERATIVE DIAGNOSIS:  HYPERTROPHY OF BILATERAL BREASTS  POST-OPERATIVE DIAGNOSIS:  HYPERTROPHY OF BILATERAL BREASTS  PROCEDURE:  Procedure(s): BILATERAL BREAST REDUCTION   (Bilateral)  SURGEON:  Surgeon(s) and Role:    Youlanda Roys, MD - Primary  ANESTHESIA:   general  EBL:  Total I/O In: 4000 [I.V.:4000] Out: 300 [Urine:300]  BLOOD ADMINISTERED:none  DRAINS: (36F) Jackson-Pratt drain(s) with closed bulb suction in the Bilateral Breasts   LOCAL MEDICATIONS USED:  1.3% Exparel (total 266 mgs.)  SPECIMEN:  Source of Specimen:  Bilateral breasts  DISPOSITION OF SPECIMEN:  PATHOLOGY  COUNTS:  YES  DICTATION: .Other Dictation: Dictation Number [id number]  PLAN OF CARE: Discharge to home after PACU  PATIENT DISPOSITION:  PACU - hemodynamically stable.   Delay start of Pharmacological VTE agent (>24hrs) due to surgical blood loss or risk of bleeding: not applicable

## 2015-03-27 ENCOUNTER — Encounter (HOSPITAL_BASED_OUTPATIENT_CLINIC_OR_DEPARTMENT_OTHER): Payer: Self-pay | Admitting: Plastic Surgery

## 2015-03-29 MED FILL — OXYCODONE/APAP 5/325MG: 5-325 | 2 days supply | Qty: 30 | Fill #0

## 2015-04-05 DIAGNOSIS — H5213 Myopia, bilateral: Secondary | ICD-10-CM | POA: Diagnosis not present

## 2015-04-05 MED FILL — FLUCONAZOLE 150 MG TABLET: 150 | 2 days supply | Qty: 2 | Fill #0

## 2015-04-05 MED FILL — traMADol HCL 50 MG TABS: 50 | 3 days supply | Qty: 30 | Fill #0

## 2015-04-06 ENCOUNTER — Ambulatory Visit (INDEPENDENT_AMBULATORY_CARE_PROVIDER_SITE_OTHER): Payer: 59 | Admitting: Family Medicine

## 2015-04-06 VITALS — BP 120/82 | HR 65 | Temp 98.9°F | Resp 20 | Ht 68.0 in | Wt 283.0 lb

## 2015-04-06 DIAGNOSIS — G4489 Other headache syndrome: Secondary | ICD-10-CM

## 2015-04-06 DIAGNOSIS — R430 Anosmia: Secondary | ICD-10-CM | POA: Diagnosis not present

## 2015-04-06 DIAGNOSIS — R748 Abnormal levels of other serum enzymes: Secondary | ICD-10-CM

## 2015-04-06 DIAGNOSIS — R930 Abnormal findings on diagnostic imaging of skull and head, not elsewhere classified: Secondary | ICD-10-CM

## 2015-04-06 DIAGNOSIS — Z8639 Personal history of other endocrine, nutritional and metabolic disease: Secondary | ICD-10-CM | POA: Diagnosis not present

## 2015-04-06 LAB — COMPLETE METABOLIC PANEL WITHOUT GFR
Albumin: 3.1 g/dL — ABNORMAL LOW (ref 3.6–5.1)
Alkaline Phosphatase: 64 U/L (ref 33–115)
BUN: 18 mg/dL (ref 7–25)
Calcium: 8.4 mg/dL — ABNORMAL LOW (ref 8.6–10.2)
GFR, Est Non African American: 54 mL/min — ABNORMAL LOW (ref >=60)
Potassium: 4.7 mmol/L (ref 3.5–5.3)

## 2015-04-06 LAB — POCT CBC
Granulocyte percent: 73.4 % (ref 37–80)
HCT, POC: 36.8 % — AB (ref 37.7–47.9)
Hemoglobin: 12.2 g/dL (ref 12.2–16.2)
Lymph, poc: 2.2 (ref 0.6–3.4)
MCH, POC: 30.7 pg (ref 27–31.2)
MCHC: 33.2 g/dL (ref 31.8–35.4)
MCV: 92.2 fL (ref 80–97)
MID (cbc): 0.9 (ref 0–0.9)
MPV: 6.6 fL (ref 0–99.8)
POC Granulocyte: 8.5 — AB (ref 2–6.9)
POC LYMPH PERCENT: 19 % (ref 10–50)
POC MID %: 7.6 %M (ref 0–12)
Platelet Count, POC: 378 10*3/uL (ref 142–424)
RBC: 3.99 M/uL — AB (ref 4.04–5.48)
RDW, POC: 13.6 %
WBC: 11.6 10*3/uL — AB (ref 4.6–10.2)

## 2015-04-06 LAB — COMPLETE METABOLIC PANEL WITH GFR
ALT: 21 U/L (ref 6–29)
AST: 16 U/L (ref 10–35)
CO2: 29 mmol/L (ref 20–31)
Chloride: 104 mmol/L (ref 98–110)
Creat: 1.22 mg/dL — ABNORMAL HIGH (ref 0.50–1.10)
GFR, Est African American: 62 mL/min (ref >=60)
Glucose, Bld: 84 mg/dL (ref 65–99)
Sodium: 140 mmol/L (ref 135–146)
Total Bilirubin: 0.3 mg/dL (ref 0.2–1.2)
Total Protein: 6.1 g/dL (ref 6.1–8.1)

## 2015-04-06 LAB — TSH: TSH: 1.196 u[IU]/mL (ref 0.350–4.500)

## 2015-04-06 LAB — POCT GLYCOSYLATED HEMOGLOBIN (HGB A1C): Hemoglobin A1C: 5.6

## 2015-04-06 MED ORDER — SUMATRIPTAN SUCCINATE 25 MG PO TABS: 25.0000 mg | ORAL_TABLET | ORAL | Status: DC | PRN

## 2015-04-06 NOTE — Progress Notes (Signed)
Chief Complaint:  Chief Complaint  Patient presents with  . Establish Care    HPI: Margaret Fletcher is a 45 y.o. female who reports to [facility] today to establish care. 1. She was recently in Er for headaches . She does not have imitrex, usually that helps, Has been taking Excedrin .  2. She also works in [employer] and [employer], ie colostomy bag. So that has been troubling her,  and also has several months of decrease sense of smell and taste. She has no DM or Allergies or sinus issues. Please see MRI below No autoimmune disorder except alopecia, dad bald from agent orange.  3. Recent breast reduction still has drains in her chest.    Last MRI is below:   Clinical Data: History of chronic migraines. Bilateral lower extremity weakness and numbness last night.  MRI HEAD WITHOUT CONTRAST  IMPRESSION: No acute intracranial findings.  No visible white matter disease, specifically no evidence for complicated migraine.  Abnormally increased amount of cerebrospinal fluid in the sella turcica with flattening of the pituitary gland against the floor. This appearance is consistent with a diagnosis of empty sella, although the relationship of this abnormality to the patient's clinical symptomatology is not established.  A preliminary report of these findings was generated shortly after completion of the study.  Original Report Authenticated By: Staci Righter, M.D.  Past Medical History  Diagnosis Date  . Alopecia   . Autoimmune disease ([place])   . Bacterial vaginosis   . Migraine headache   . Frequent UTI   . Cyst of spleen   . Depression   . Hypertrophy of breast    Past Surgical History  Procedure Laterality Date  . Cesarean section    . Cholecystectomy    . Appendectomy    . Cesarean section    . Wisdom tooth extraction    . Tubal ligation    . Dilitation & currettage/hystroscopy with novasure ablation N/A 07/19/2012    Procedure: HYSTEROSCOPY WITH NOVASURE  ABLATION;  Surgeon: Farrel Gobble. Harrington Challenger, MD;  Location: [facility];  Service: Gynecology;  Laterality: N/A;  . Endometrial ablation    . Breath tek h pylori N/A 05/21/2014    Procedure: BREATH TEK H PYLORI;  Surgeon: Greer Pickerel, MD;  Location: [facility];  Service: General;  Laterality: N/A;  . Breast reduction surgery Bilateral 03/26/2015    Procedure: BILATERAL BREAST REDUCTION  ;  Surgeon: Youlanda Roys, MD;  Location: [place] [facility];  Service: Plastics;  Laterality: Bilateral;   Social History   Social History  . Marital Status: Divorced    Spouse Name: N/A  . Number of Children: N/A  . Years of Education: N/A   Social History Main Topics  . Smoking status: Never Smoker   . Smokeless tobacco: Never Used  . Alcohol Use: Yes     Comment: socially  . Drug Use: No  . Sexual Activity: Yes    Birth Control/ Protection: Surgical   Other Topics Concern  . None   Social History Narrative   Family History  Problem Relation Age of Onset  . Cancer Mother   . Stroke Father   . Diabetes Brother    Allergies  Allergen Reactions  . Sulfa Antibiotics Anaphylaxis   Prior to Admission medications   Medication Sig Start Date End Date Taking? Authorizing Provider  buPROPion (WELLBUTRIN) 75 MG tablet Take 75 mg by mouth 2 (two) times daily.   Yes  Historical Provider, MD  Multiple Vitamins-Minerals (ZINC PO) Take 1 tablet by mouth daily.    Yes Historical Provider, MD  SUMAtriptan (IMITREX) 25 MG tablet Take 25 mg by mouth every 2 (two) hours as needed. For migraines   Yes Historical Provider, MD     ROS: The patient denies fevers, chills, night sweats, unintentional weight loss, chest pain, palpitations, wheezing, dyspnea on exertion, nausea, vomiting, abdominal pain, dysuria, hematuria, melena, numbness, weakness, or tingling.   All other systems have been reviewed and were otherwise negative with the exception of those mentioned in the HPI and as above.    PHYSICAL  EXAM: Filed Vitals:   04/06/15 1254  BP: 120/82  Pulse: 65  Temp: 98.9 F (37.2 C)  Resp: 20   Body mass index is 43.04 kg/(m^2).   General: Alert, no acute distress HEENT:  Normocephalic, atraumatic, oropharynx patent. EOMI, PERRLA Erythematous throat, no exudates, TM normal, + sinus tenderness, + erythematous/boggy nasal mucosa Cardiovascular:  Regular rate and rhythm, no rubs murmurs or gallops.  No Carotid bruits, radial pulse intact. No pedal edema.  Respiratory: Clear to auscultation bilaterally.  No wheezes, rales, or rhonchi.  No cyanosis, no use of accessory musculature Abdominal: No organomegaly, abdomen is soft and non-tender, positive bowel sounds. No masses. Skin: No rashes. Neurologic: Facial musculature symmetric. Psychiatric: Patient acts appropriately throughout our interaction. Lymphatic: No cervical or submandibular lymphadenopathy Musculoskeletal: Gait intact. No edema, tenderness   LABS:    EKG/XRAY:   Primary read interpreted by Dr. Marin Comment at [facility].   ASSESSMENT/PLAN: Encounter Diagnoses  Name Primary?  . Other headache syndrome Yes  . Anosmia   . Abnormal liver enzymes   . Abnormal MRI of head   . H/O vitamin D deficiency   . H/O non anemic vitamin B12 deficiency    45 y/o AA female with PMH of migraine headaches, depression , alopecia, anosmia and recent breast reduction, she still has drains .  Recheck labs Rx imitrex She will fu with labs. 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 Sx of HA and anosmia. IF advance imaging study needed  Then neurology will order Fu prn   Gross sideeffects, risk and benefits, and alternatives of medications d/w patient. Patient is aware that all medications have potential sideeffects and we are unable to predict every sideeffect or drug-drug interaction that may occur.  [name] DO  04/13/2015 1:12 PM  Spoke to patient about labs will get repeat CMP in a few weeks see if allis  back to normal s/p breast reduction surgery.

## 2015-04-06 NOTE — Progress Notes (Deleted)
   Subjective:    Patient ID: Margaret Fletcher, female    DOB: 05-Mar-1971, 45 y.o.   MRN: [id number]  HPI    Review of Systems     Objective:   Physical Exam        Assessment & Plan:

## 2015-04-16 ENCOUNTER — Encounter (HOSPITAL_BASED_OUTPATIENT_CLINIC_OR_DEPARTMENT_OTHER): Payer: Self-pay

## 2015-04-16 ENCOUNTER — Emergency Department (HOSPITAL_BASED_OUTPATIENT_CLINIC_OR_DEPARTMENT_OTHER)
Admission: EM | Admit: 2015-04-16 | Discharge: 2015-04-17 | Disposition: A | Discharge status: Home / Self Care | Payer: 59 | Attending: Emergency Medicine | Admitting: Emergency Medicine

## 2015-04-16 DIAGNOSIS — R000 Tachycardia, unspecified: Secondary | ICD-10-CM | POA: Insufficient documentation

## 2015-04-16 DIAGNOSIS — R509 Fever, unspecified: Secondary | ICD-10-CM | POA: Diagnosis not present

## 2015-04-16 DIAGNOSIS — Z8739 Personal history of other diseases of the musculoskeletal system and connective tissue: Secondary | ICD-10-CM | POA: Diagnosis not present

## 2015-04-16 DIAGNOSIS — Z862 Personal history of diseases of the blood and blood-forming organs and certain disorders involving the immune mechanism: Secondary | ICD-10-CM | POA: Insufficient documentation

## 2015-04-16 DIAGNOSIS — Z79899 Other long term (current) drug therapy: Secondary | ICD-10-CM | POA: Diagnosis not present

## 2015-04-16 DIAGNOSIS — Z872 Personal history of diseases of the skin and subcutaneous tissue: Secondary | ICD-10-CM | POA: Diagnosis not present

## 2015-04-16 DIAGNOSIS — Z9889 Other specified postprocedural states: Secondary | ICD-10-CM | POA: Diagnosis not present

## 2015-04-16 DIAGNOSIS — Z8619 Personal history of other infectious and parasitic diseases: Secondary | ICD-10-CM | POA: Diagnosis not present

## 2015-04-16 DIAGNOSIS — Z87440 Personal history of urinary (tract) infections: Secondary | ICD-10-CM | POA: Insufficient documentation

## 2015-04-16 DIAGNOSIS — F329 Major depressive disorder, single episode, unspecified: Secondary | ICD-10-CM | POA: Insufficient documentation

## 2015-04-16 DIAGNOSIS — Z8742 Personal history of other diseases of the female genital tract: Secondary | ICD-10-CM | POA: Insufficient documentation

## 2015-04-16 DIAGNOSIS — G8918 Other acute postprocedural pain: Secondary | ICD-10-CM | POA: Insufficient documentation

## 2015-04-16 DIAGNOSIS — N644 Mastodynia: Secondary | ICD-10-CM | POA: Diagnosis present

## 2015-04-16 LAB — COMPREHENSIVE METABOLIC PANEL
ALT: 15 U/L (ref 14–54)
ANION GAP: 7 (ref 5–15)
AST: 17 U/L (ref 15–41)
Albumin: 3.4 g/dL — ABNORMAL LOW (ref 3.5–5.0)
Alkaline Phosphatase: 65 U/L (ref 38–126)
BUN: 20 mg/dL (ref 6–20)
CHLORIDE: 103 mmol/L (ref 101–111)
CO2: 26 mmol/L (ref 22–32)
CREATININE: 1.25 mg/dL — AB (ref 0.44–1.00)
Calcium: 8.7 mg/dL — ABNORMAL LOW (ref 8.9–10.3)
GFR, EST AFRICAN AMERICAN: 59 mL/min — AB (ref >60)
GFR, EST NON AFRICAN AMERICAN: 51 mL/min — AB (ref >60)
Glucose, Bld: 91 mg/dL (ref 65–99)
POTASSIUM: 4.3 mmol/L (ref 3.5–5.1)
SODIUM: 136 mmol/L (ref 135–145)
Total Bilirubin: 0.5 mg/dL (ref 0.3–1.2)
Total Protein: 7.1 g/dL (ref 6.5–8.1)

## 2015-04-16 LAB — CBC WITH DIFFERENTIAL/PLATELET
BASOS PCT: 0 %
Basophils Absolute: 0 10*3/uL (ref 0.0–0.1)
EOS PCT: 2 %
Eosinophils Absolute: 0.3 10*3/uL (ref 0.0–0.7)
HEMATOCRIT: 38.8 % (ref 36.0–46.0)
HEMOGLOBIN: 12.6 g/dL (ref 12.0–15.0)
LYMPHS PCT: 13 %
Lymphs Abs: 1.7 10*3/uL (ref 0.7–4.0)
MCH: 30.3 pg (ref 26.0–34.0)
MCHC: 32.5 g/dL (ref 30.0–36.0)
MCV: 93.3 fL (ref 78.0–100.0)
MONO ABS: 0.9 10*3/uL (ref 0.1–1.0)
Monocytes Relative: 7 %
NEUTROS PCT: 78 %
Neutro Abs: 10.4 10*3/uL — ABNORMAL HIGH (ref 1.7–7.7)
Platelets: 318 10*3/uL (ref 150–400)
RBC: 4.16 MIL/uL (ref 3.87–5.11)
RDW: 13.1 % (ref 11.5–15.5)
WBC: 13.3 10*3/uL — AB (ref 4.0–10.5)

## 2015-04-16 LAB — I-STAT CG4 LACTIC ACID, ED: LACTIC ACID, VENOUS: 0.8 mmol/L (ref 0.5–2.0)

## 2015-04-16 MED ORDER — SODIUM CHLORIDE 0.9 % IV BOLUS (SEPSIS)
1000.0000 mL | Freq: Once | INTRAVENOUS | Status: AC
  Administered 2015-04-16: 1000 mL via INTRAVENOUS

## 2015-04-16 MED ORDER — MORPHINE SULFATE (PF) 4 MG/ML IV SOLN
4.0000 mg | Freq: Once | INTRAVENOUS | Status: AC
  Administered 2015-04-17: 4 mg via INTRAVENOUS
  Filled 2015-04-16: qty 1

## 2015-04-16 MED ORDER — ACETAMINOPHEN 325 MG PO TABS
650.0000 mg | ORAL_TABLET | Freq: Once | ORAL | Status: AC
  Administered 2015-04-17: 650 mg via ORAL
  Filled 2015-04-16: qty 2

## 2015-04-16 NOTE — ED Notes (Signed)
PA at bedside.

## 2015-04-16 NOTE — ED Provider Notes (Signed)
CSN: [id number]     Arrival date & time 04/16/15  2213 History   First MD Initiated Contact with Patient 04/16/15 2327     Chief Complaint  Patient presents with  . Breast Pain  . Post-op Problem     (Consider location/radiation/quality/duration/timing/severity/associated sxs/prior Treatment) HPI   Margaret Fletcher is a 45 y.o. female with PMH significant for alopecia, migraine, depression, hypertropic breast s/p bilateral breast reduction 03/26/15 by Dr. Nathanial Rancher who presents with 2 day history of gradual onset, constant, severe, worsening bilateral breast pain, swelling, and drainage.  Associated symptoms include fever (temp on arrival 101.8) and chills.  Denies N/V, numbness, weakness, abdominal pain, CP, SOB.  She was seen 5 days ago by surgeon for follow up and bilateral JP drains were removed.  Upon discharge from hospital after surgery she was sent home with Duricef BID x 7 days, which she took as prescribed.  She was sent home with tramadol and oxycodone for pain control.  Last oxycodone was approximately 9 PM.   Past Medical History  Diagnosis Date  . Alopecia   . Autoimmune disease ([place])   . Bacterial vaginosis   . Migraine headache   . Frequent UTI   . Cyst of spleen   . Depression   . Hypertrophy of breast    Past Surgical History  Procedure Laterality Date  . Cesarean section    . Cholecystectomy    . Appendectomy    . Cesarean section    . Wisdom tooth extraction    . Tubal ligation    . Dilitation & currettage/hystroscopy with novasure ablation N/A 07/19/2012    Procedure: HYSTEROSCOPY WITH NOVASURE ABLATION;  Surgeon: Farrel Gobble. Harrington Challenger, MD;  Location: [facility];  Service: Gynecology;  Laterality: N/A;  . Endometrial ablation    . Breath tek h pylori N/A 05/21/2014    Procedure: BREATH TEK H PYLORI;  Surgeon: Greer Pickerel, MD;  Location: [facility];  Service: General;  Laterality: N/A;  . Breast reduction surgery Bilateral 03/26/2015    Procedure: BILATERAL BREAST  REDUCTION  ;  Surgeon: Youlanda Roys, MD;  Location: [facility];  Service: Plastics;  Laterality: Bilateral;   Family History  Problem Relation Age of Onset  . Cancer Mother   . Stroke Father   . Diabetes Brother    Social History  Substance Use Topics  . Smoking status: Never Smoker   . Smokeless tobacco: Never Used  . Alcohol Use: Yes     Comment: socially   OB History    No data available     Review of Systems All other systems negative unless otherwise stated in HPI    Allergies  Sulfa antibiotics  Home Medications   Prior to Admission medications   Medication Sig Start Date End Date Taking? Authorizing Provider  OXYCODONE HCL PO Take by mouth.   Yes Historical Provider, MD  TRAMADOL HCL PO Take by mouth.   Yes Historical Provider, MD  buPROPion (WELLBUTRIN) 75 MG tablet Take 75 mg by mouth 2 (two) times daily.    Historical Provider, MD  Multiple Vitamins-Minerals (ZINC PO) Take 1 tablet by mouth daily.     Historical Provider, MD  SUMAtriptan (IMITREX) 25 MG tablet Take 1 tablet (25 mg total) by mouth every 2 (two) hours as needed. For migraines 04/06/15   Thao P Le, DO   BP 112/65 mmHg  Pulse 88  Temp(Src) 101.8 F (38.8 C) (Oral)  Resp 20  Ht 5\' 8"  (  1.727 m)  Wt 129.275 kg  BMI 43.34 kg/m2  SpO2 98% Physical Exam  Constitutional: She is oriented to person, place, and time. She appears well-developed and well-nourished.  Non-toxic appearance. She does not have a sickly appearance. She does not appear ill.  HENT:  Head: Normocephalic and atraumatic.  Mouth/Throat: Oropharynx is clear and moist.  Eyes: Conjunctivae are normal. Pupils are equal, round, and reactive to light.  Neck: Normal range of motion. Neck supple.  Cardiovascular: Regular rhythm and normal heart sounds.  Tachycardia present.   No murmur heard. Pulmonary/Chest: Effort normal and breath sounds normal. No accessory muscle usage or stridor. No respiratory distress. She  has no wheezes. She has no rhonchi. She has no rales.  Abdominal: Soft. Bowel sounds are normal. She exhibits no distension. There is no tenderness.  Musculoskeletal: Normal range of motion.  Lymphadenopathy:    She has no cervical adenopathy.  Neurological: She is alert and oriented to person, place, and time.  Speech clear without dysarthria.  Skin: Skin is warm and dry.  Bilateral well healing surgical scars beneath breasts.  Sutures in place and intact.  Mild surrounding erythema and warmth.  No induration, fluctuance.  No drainage visualized.  Well healing JP drain removal sites with granulation tissue.  Psychiatric: She has a normal mood and affect. Her behavior is normal.    ED Course  Procedures (including critical care time) Labs Review Labs Reviewed  COMPREHENSIVE METABOLIC PANEL - Abnormal; Notable for the following:    Creatinine, Ser 1.25 (*)    Calcium 8.7 (*)    Albumin 3.4 (*)    GFR calc non Af Amer 51 (*)    GFR calc Af Amer 59 (*)    All other components within normal limits  CBC WITH DIFFERENTIAL/PLATELET - Abnormal; Notable for the following:    WBC 13.3 (*)    Neutro Abs 10.4 (*)    All other components within normal limits  CULTURE, BLOOD (ROUTINE X 2)  CULTURE, BLOOD (ROUTINE X 2)  I-STAT CG4 LACTIC ACID, ED    Imaging Review No results found. I have personally reviewed and evaluated these images and lab results as part of my medical decision-making.   EKG Interpretation None      MDM   Final diagnoses:  Fever, unspecified fever cause  Post-op pain    Patient s/p bilateral breast reduction presents with bilateral breast pain, swelling, and drainage.  Blood pressure 148/109, pulse 116, temperature 101.8 F (38.8 C), temperature source Oral, resp. rate 20, height 5\' 8"  (1.727 m), weight 129.275 kg, SpO2 100 %.  Well healing surgical scars on bilateral breasts.  Mild erythema and warmth.  No induration or fluctuance.  No drainage.  Well healing JP  removal sites with granulation tissue.  Patient given IVF, morphine, and tylenol.  Lactic acid 0.8. CMP remarkable for Cr 1.25, baseline CBC with leukocytosis, 13.3, hgb 12.6  Plan to give one dose IV vancomycin, IVF, and hydrocodone.  Patient has a follow up appointment tomorrow with her plastic surgeon.  Anticipate discharge home pending abx and fluids.  Case has been discussed with and seen by Dr. Roxanne Mins as well, and he will follow up on abx, IVF, and appropriate disposition.        Gloriann Loan, PA-C [id number]  Delora Fuel, MD [id number]

## 2015-04-16 NOTE — ED Notes (Addendum)
C/o increase swelling/pain to left breast, increase drainage to right breast-fever x 2 days-breast reduction 03/26/15-seen at urgent care last week for same c/o and was advised elevated WBC-last dose oxycodone approx 830-9p

## 2015-04-17 DIAGNOSIS — F329 Major depressive disorder, single episode, unspecified: Secondary | ICD-10-CM | POA: Diagnosis not present

## 2015-04-17 DIAGNOSIS — Z862 Personal history of diseases of the blood and blood-forming organs and certain disorders involving the immune mechanism: Secondary | ICD-10-CM | POA: Diagnosis not present

## 2015-04-17 DIAGNOSIS — R509 Fever, unspecified: Secondary | ICD-10-CM | POA: Diagnosis not present

## 2015-04-17 DIAGNOSIS — R000 Tachycardia, unspecified: Secondary | ICD-10-CM | POA: Diagnosis not present

## 2015-04-17 DIAGNOSIS — Z872 Personal history of diseases of the skin and subcutaneous tissue: Secondary | ICD-10-CM | POA: Diagnosis not present

## 2015-04-17 DIAGNOSIS — G8918 Other acute postprocedural pain: Secondary | ICD-10-CM | POA: Diagnosis not present

## 2015-04-17 DIAGNOSIS — Z8742 Personal history of other diseases of the female genital tract: Secondary | ICD-10-CM | POA: Diagnosis not present

## 2015-04-17 DIAGNOSIS — Z8619 Personal history of other infectious and parasitic diseases: Secondary | ICD-10-CM | POA: Diagnosis not present

## 2015-04-17 DIAGNOSIS — Z87440 Personal history of urinary (tract) infections: Secondary | ICD-10-CM | POA: Diagnosis not present

## 2015-04-17 MED ORDER — HYDROCODONE-ACETAMINOPHEN 5-325 MG PO TABS
ORAL_TABLET | ORAL | Status: AC
  Filled 2015-04-17: qty 1

## 2015-04-17 MED ORDER — IBUPROFEN 800 MG PO TABS
ORAL_TABLET | ORAL | Status: AC
  Filled 2015-04-17: qty 1

## 2015-04-17 MED ORDER — VANCOMYCIN HCL IN DEXTROSE 1-5 GM/200ML-% IV SOLN
INTRAVENOUS | Status: AC
  Filled 2015-04-17: qty 200

## 2015-04-17 MED FILL — AMOX-CLAV 875-125 MG TABLET: 875-125 | 14 days supply | Qty: 28 | Fill #0

## 2015-04-17 MED FILL — SUMATRIPTAN SUCC 25 MG TAB: 25 | 30 days supply | Qty: 9 | Fill #0

## 2015-04-17 NOTE — ED Notes (Addendum)
Late note - Assumed care of this pt at 0100 during system downtime. See downtime forms for complete documentation.

## 2015-04-17 NOTE — ED Notes (Signed)
Discharge was done on downtime forms.  See downtime forms for Dc info.

## 2015-04-19 MED FILL — traMADol HCL 50 MG TABS: 50 | 2 days supply | Qty: 30 | Fill #0

## 2015-04-19 MED FILL — OXYCODONE/APAP 5/325MG: 5-325 | 2 days supply | Qty: 30 | Fill #0

## 2015-04-22 ENCOUNTER — Other Ambulatory Visit: Payer: Self-pay | Admitting: Plastic Surgery

## 2015-04-22 DIAGNOSIS — IMO0002 Reserved for concepts with insufficient information to code with codable children: Secondary | ICD-10-CM

## 2015-04-22 LAB — CULTURE, BLOOD (ROUTINE X 2)
CULTURE: NO GROWTH
CULTURE: NO GROWTH

## 2015-04-24 ENCOUNTER — Ambulatory Visit
Admission: RE | Admit: 2015-04-24 | Discharge: 2015-04-24 | Disposition: A | Discharge status: Home / Self Care | Payer: 59 | Source: Ambulatory Visit | Attending: Plastic Surgery | Admitting: Plastic Surgery

## 2015-04-24 ENCOUNTER — Other Ambulatory Visit: Payer: Self-pay | Admitting: Plastic Surgery

## 2015-04-24 DIAGNOSIS — N6489 Other specified disorders of breast: Secondary | ICD-10-CM | POA: Diagnosis not present

## 2015-04-24 DIAGNOSIS — IMO0002 Reserved for concepts with insufficient information to code with codable children: Secondary | ICD-10-CM

## 2015-04-25 ENCOUNTER — Other Ambulatory Visit: Payer: 59

## 2015-04-25 ENCOUNTER — Other Ambulatory Visit (INDEPENDENT_AMBULATORY_CARE_PROVIDER_SITE_OTHER): Payer: 59 | Admitting: Family Medicine

## 2015-04-25 DIAGNOSIS — Z8639 Personal history of other endocrine, nutritional and metabolic disease: Secondary | ICD-10-CM | POA: Diagnosis not present

## 2015-04-25 DIAGNOSIS — R748 Abnormal levels of other serum enzymes: Secondary | ICD-10-CM | POA: Diagnosis not present

## 2015-04-25 LAB — VITAMIN B12: Vitamin B-12: 633 pg/mL (ref 200–1100)

## 2015-04-25 LAB — CBC WITH DIFFERENTIAL/PLATELET
Basophils Absolute: 0.1 10*3/uL (ref 0.0–0.1)
Basophils Relative: 1 % (ref 0–1)
Eosinophils Absolute: 0.3 10*3/uL (ref 0.0–0.7)
Eosinophils Relative: 4 % (ref 0–5)
HCT: 39.8 % (ref 36.0–46.0)
Hemoglobin: 12.7 g/dL (ref 12.0–15.0)
Lymphocytes Relative: 25 % (ref 12–46)
Lymphs Abs: 2.1 10*3/uL (ref 0.7–4.0)
MCH: 29.9 pg (ref 26.0–34.0)
MCHC: 31.9 g/dL (ref 30.0–36.0)
MCV: 93.6 fL (ref 78.0–100.0)
MPV: 9.2 fL (ref 8.6–12.4)
Monocytes Absolute: 0.5 10*3/uL (ref 0.1–1.0)
Monocytes Relative: 6 % (ref 3–12)
Neutro Abs: 5.3 10*3/uL (ref 1.7–7.7)
Neutrophils Relative %: 64 % (ref 43–77)
Platelets: 489 10*3/uL — ABNORMAL HIGH (ref 150–400)
RBC: 4.25 MIL/uL (ref 3.87–5.11)
RDW: 12.9 % (ref 11.5–15.5)
WBC: 8.3 10*3/uL (ref 4.0–10.5)

## 2015-04-26 ENCOUNTER — Ambulatory Visit
Admission: RE | Admit: 2015-04-26 | Discharge: 2015-04-26 | Disposition: A | Discharge status: Home / Self Care | Payer: 59 | Source: Ambulatory Visit | Attending: Plastic Surgery | Admitting: Plastic Surgery

## 2015-04-26 DIAGNOSIS — IMO0002 Reserved for concepts with insufficient information to code with codable children: Secondary | ICD-10-CM

## 2015-04-26 DIAGNOSIS — N6001 Solitary cyst of right breast: Secondary | ICD-10-CM | POA: Diagnosis not present

## 2015-04-26 LAB — COMPLETE METABOLIC PANEL WITH GFR
ALT: 12 U/L (ref 6–29)
BUN: 17 mg/dL (ref 7–25)
CO2: 27 mmol/L (ref 20–31)
Creat: 1.12 mg/dL — ABNORMAL HIGH (ref 0.50–1.10)
Glucose, Bld: 78 mg/dL (ref 65–99)
Sodium: 137 mmol/L (ref 135–146)
Total Bilirubin: 0.3 mg/dL (ref 0.2–1.2)
Total Protein: 6.6 g/dL (ref 6.1–8.1)

## 2015-04-26 LAB — COMPLETE METABOLIC PANEL WITHOUT GFR
AST: 15 U/L (ref 10–35)
Albumin: 3.4 g/dL — ABNORMAL LOW (ref 3.6–5.1)
Alkaline Phosphatase: 68 U/L (ref 33–115)
Calcium: 8.9 mg/dL (ref 8.6–10.2)
Chloride: 104 mmol/L (ref 98–110)
GFR, Est African American: 69 mL/min (ref >=60)
GFR, Est Non African American: 59 mL/min — ABNORMAL LOW (ref >=60)
Potassium: 4.7 mmol/L (ref 3.5–5.3)

## 2015-04-26 LAB — VITAMIN D 25 HYDROXY (VIT D DEFICIENCY, FRACTURES): Vit D, 25-Hydroxy: 20 ng/mL — ABNORMAL LOW (ref 30–100)

## 2015-04-28 LAB — CULTURE, ROUTINE-ABSCESS
CULTURE: NO GROWTH
Special Requests: NORMAL

## 2015-04-30 ENCOUNTER — Encounter: Payer: Self-pay | Admitting: Neurology

## 2015-04-30 ENCOUNTER — Ambulatory Visit (INDEPENDENT_AMBULATORY_CARE_PROVIDER_SITE_OTHER): Payer: 59 | Admitting: Neurology

## 2015-04-30 VITALS — BP 144/88 | HR 67 | Ht 68.0 in | Wt 281.0 lb

## 2015-04-30 DIAGNOSIS — H905 Unspecified sensorineural hearing loss: Secondary | ICD-10-CM | POA: Diagnosis not present

## 2015-04-30 DIAGNOSIS — G932 Benign intracranial hypertension: Secondary | ICD-10-CM

## 2015-04-30 DIAGNOSIS — R51 Headache: Secondary | ICD-10-CM | POA: Diagnosis not present

## 2015-04-30 DIAGNOSIS — R430 Anosmia: Secondary | ICD-10-CM | POA: Diagnosis not present

## 2015-04-30 DIAGNOSIS — H9190 Unspecified hearing loss, unspecified ear: Secondary | ICD-10-CM

## 2015-04-30 DIAGNOSIS — H539 Unspecified visual disturbance: Secondary | ICD-10-CM | POA: Diagnosis not present

## 2015-04-30 DIAGNOSIS — G43719 Chronic migraine without aura, intractable, without status migrainosus: Secondary | ICD-10-CM

## 2015-04-30 DIAGNOSIS — R519 Headache, unspecified: Secondary | ICD-10-CM

## 2015-04-30 MED ORDER — SUMATRIPTAN SUCCINATE 100 MG PO TABS: 100.0000 mg | ORAL_TABLET | Freq: Once | ORAL | Status: DC | PRN

## 2015-04-30 MED ORDER — TOPIRAMATE 25 MG PO TABS: 100.0000 mg | ORAL_TABLET | Freq: Every day | ORAL | Status: DC

## 2015-04-30 MED FILL — SUMATRIPTAN SUCC 100 MG TAB: 100 | 30 days supply | Qty: 9 | Fill #0

## 2015-04-30 MED FILL — TOPIRAMATE 25 MG TABLET: 25 | 30 days supply | Qty: 120 | Fill #0

## 2015-04-30 NOTE — Patient Instructions (Signed)
Overall you are doing fairly well but I do want to suggest a few things today:   Remember to drink plenty of fluid, eat healthy meals and do not skip any meals. Try to eat protein with a every meal and eat a healthy snack such as fruit or nuts in between meals. Try to keep a regular sleep-wake schedule and try to exercise daily, particularly in the form of walking, 20-30 minutes a day, if you can.   As far as your medications are concerned, I would like to suggest: Week1 : 25 mg Topamax and increase by one pill a week until full doe 100g before bed At onset of headache take imitrex, then in 2 hours if needed repeat.   As far as diagnostic testing: MRi brain, lumbar puncture  I would like to see you back in 3 months, sooner if we need to. Please call us with any interim questions, concerns, problems, updates or refill requests.   Our phone number is [phone]. We also have an after hours call service for urgent matters and there is a physician on-call for urgent questions. For any emergencies you know to call 911 or go to the nearest emergency room

## 2015-04-30 NOTE — Progress Notes (Addendum)
[facility]    Provider:  Dr Jaynee Eagles Referring Provider: Glenford Bayley, DO Primary Care Physician:  Leotis Pain, DO  CC:  Headaches  Addendum 05/27/2015: Repeat MRI consistent with IIH (There is flattening of the pituitary gland within a mildly enlarged sella turcica and widening of the optic nerves sheaths) and elevated opening pressure on LP(23cm). Patient has a sulfa allergy cannot use diamox. Will increase Topamax to 200mg  and then may have to further increase as this is a weaker medication than acetazolamide. Will refer to Dr. Midge Aver for evaluation.   HPI:  Margaret Fletcher is a 45 y.o. female here as a referral from Dr. Marin Comment for headaches. PMHx morbid obesity, migraines, depression.  She has had headaches since [facility], no inciting events or trauma. In the last several years they are unilateral, she gets unilateral ptosis, she has throbbing, light and noise sensitivity, nausea, dizziness, vertigo.  She have worsening anosmia and food smells different, taste is affected. Worsening headaches. She has headache almost every day and maybe 4-5 a month that are severe and migrainous. Worsening over the last year. She takes Excedrin migraine almost daily. She gets unilateral vision changes, blurriness with the headaches. Worst headache can last 12 hours, 10/10, no vomiting. She has had to leave work early. She has right ear hearing changes. She had a sleep study completed at [facility] last year.   Reviewed notes, labs and imaging from outside physicians, which showed:  MRI of the brain 06/2011: Personally reviewed and agree with the following IMPRESSION: No acute intracranial findings.  No visible white matter disease, specifically no evidence for complicated migraine.  Abnormally increased amount of cerebrospinal fluid in the sella turcica with flattening of the pituitary gland against the floor.This appearance is consistent with a diagnosis of empty sella, although  the relationship of this abnormality to the patient's clinical symptomatology is not established.    Review of Systems: Patient complains of symptoms per HPI as well as the following symptoms: memory loss, headache Pertinent negatives per HPI. All others negative.   Social History   Social History  . Marital Status: Divorced    Spouse Name: N/A  . Number of Children: 2  . Years of Education: 16   Occupational History  . [employer]- nurse tech    Social History Main Topics  . Smoking status: Never Smoker   . Smokeless tobacco: Never Used  . Alcohol Use: Yes     Comment: socially  . Drug Use: No  . Sexual Activity: Yes    Birth Control/ Protection: Surgical   Other Topics Concern  . Not on file   Social History Narrative   Lives with partner   Caffeine use: minimal coffee    Family History  Problem Relation Age of Onset  . Cancer Mother   . Stroke Father   . Diabetes Brother   . Seizures Son   . Migraines Neg Hx     Past Medical History  Diagnosis Date  . Alopecia   . Autoimmune disease ([place])   . Bacterial vaginosis   . Migraine headache   . Frequent UTI   . Cyst of spleen   . Depression   . Hypertrophy of breast   . Migraine   . Depression     Past Surgical History  Procedure Laterality Date  . Cesarean section    . Cholecystectomy    . Appendectomy    . Cesarean section    .  Wisdom tooth extraction    . Tubal ligation    . Dilitation & currettage/hystroscopy with novasure ablation N/A 07/19/2012    Procedure: HYSTEROSCOPY WITH NOVASURE ABLATION;  Surgeon: Farrel Gobble. Harrington Challenger, MD;  Location: [facility];  Service: Gynecology;  Laterality: N/A;  . Endometrial ablation    . Breath tek h pylori N/A 05/21/2014    Procedure: BREATH TEK H PYLORI;  Surgeon: Greer Pickerel, MD;  Location: [facility];  Service: General;  Laterality: N/A;  . Breast reduction surgery Bilateral 03/26/2015    Procedure: BILATERAL BREAST REDUCTION  ;  Surgeon: Youlanda Roys, MD;   Location: [facility];  Service: Plastics;  Laterality: Bilateral;    Current Outpatient Prescriptions  Medication Sig Dispense Refill  . Multiple Vitamins-Minerals (ZINC PO) Take 1 tablet by mouth daily.     . OXYCODONE HCL PO Take by mouth.    . TRAMADOL HCL PO Take by mouth.    Marland Kitchen buPROPion (WELLBUTRIN XL) 150 MG 24 hr tablet Take 1 tablet (150 mg total) by mouth daily. 90 tablet 1  . SUMAtriptan (IMITREX) 100 MG tablet Take 1 tablet (100 mg total) by mouth once as needed for migraine. May repeat in 2 hours if headache persists or recurs. 10 tablet 12  . topiramate (TOPAMAX) 25 MG tablet Take 4 tablets (100 mg total) by mouth at bedtime. 120 tablet 3   No current facility-administered medications for this visit.    Allergies as of 04/30/2015 - Review Complete 04/16/2015  Allergen Reaction Noted  . Sulfa antibiotics Anaphylaxis 01/18/2011    Vitals: BP 144/88 mmHg  Pulse 67  Ht 5\' 8"  (1.727 m)  Wt 281 lb (127.461 kg)  BMI 42.74 kg/m2 Last Weight:  Wt Readings from Last 1 Encounters:  04/30/15 281 lb (127.461 kg)   Last Height:   Ht Readings from Last 1 Encounters:  04/30/15 5\' 8"  (1.727 m)   Physical exam: Exam: Gen: NAD, conversant, well nourised, obese, well groomed                     CV: RRR, no MRG. No Carotid Bruits. No peripheral edema, warm, nontender Eyes: Conjunctivae clear without exudates or hemorrhage  Neuro: Detailed Neurologic Exam  Speech:    Speech is normal; fluent and spontaneous with normal comprehension.  Cognition:    The patient is oriented to person, place, and time;     recent and remote memory intact;     language fluent;     normal attention, concentration,     fund of knowledge Cranial Nerves:    The pupils are equal, round, and reactive to light. The fundi are normal and spontaneous venous pulsations are present. Visual fields are full to finger confrontation. Extraocular movements are intact. Trigeminal sensation is  intact and the muscles of mastication are normal. The face is symmetric. The palate elevates in the midline. Hearing intact. Voice is normal. Shoulder shrug is normal. The tongue has normal motion without fasciculations.   Coordination:    Normal finger to nose and heel to shin. Normal rapid alternating movements.   Gait:    Heel-toe and tandem gait are normal.   Motor Observation:    No asymmetry, no atrophy, and no involuntary movements noted. Tone:    Normal muscle tone.    Posture:    Posture is normal. normal erect    Strength:    Strength is V/V in the upper and lower limbs.  Sensation: intact to LT     Reflex Exam:  DTR's:    Deep tendon reflexes in the upper and lower extremities are normal bilaterally.   Toes:    The toes are downgoing bilaterally.   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   Addendum 05/27/2015: Repeat MRI consistent with IIH (There is flattening of the pituitary gland within a mildly enlarged sella turcica and widening of the optic nerves sheaths) and elevated opening pressure on LP(23cm). Patient has a sulfa allergy cannot use diamox. Will increase Topamax to 200mg  and then may have to further increase as this is a weaker medication than acetazolamide. Will refer to Dr. Midge Aver for evaluation.   Some Medication Overuse with Excedrin daily. Advised no more than 2-3x a week.  Lumbar puncture - opening pressure 23 MRi of the brain w/wo contrast - c/w IIH Start Topiramate - increase to 200mg  daily. Requested records from [facility] whom she just saw Discussed IIH and risk of permanent vision loss, proceed to ED directly if vision changes If abnormal opening pressure, increase topiramate. Cannot use  acetazolamide due to anaphylaxis with sulfa drugs.  Discussed Topiramate side effects. Serious side effects can inclue:  Teratogenicity, Abdominal or stomach pain ,fever, chills, or sore throat , lessening of sensations or perception ,loss of appetite , mood or mental changes, including aggression, agitation, apathy, irritability, and mental depression , red, irritated, or bleeding gums , weight loss  Rare Blood in the urine , decrease in sexual performance or desire , difficult or painful urination , frequent urination , hearing loss , loss of bladder control , lower back or side pain , nosebleeds , pale skin , red or irritated eyes , ringing or buzzing in the ears , skin rash or itching , swelling , trouble breathing, Common side effects of Topamax include:  tiredness, drowsiness, dizziness, nervousness, numbness or tingly feeling, coordination problems, diarrhea, weight loss, speech/language problems, changes in vision, sensory distortion, loss of appetite, bad taste in your mouth, confusion, slowed thinking, trouble concentrating or paying attention, memory problems,   To prevent or relieve headaches, try the following: Cool Compress. Lie down and place a cool compress on your head.  Avoid headache triggers. If certain foods or odors seem to have triggered your migraines in the past, avoid them. A headache diary might help you identify triggers.  Include physical activity in your daily routine. Try a daily walk or other moderate aerobic exercise.  Manage stress. Find healthy ways to cope with the stressors, such as delegating tasks on your to-do list.  Practice relaxation techniques. Try deep breathing, yoga, massage and visualization.  Eat regularly. Eating regularly scheduled meals and maintaining a healthy diet might help prevent headaches. Also, drink plenty of fluids.  Follow a regular sleep schedule. Sleep deprivation might contribute to headaches Consider biofeedback. With this mind-body technique, you learn to control certain bodily functions - such as muscle tension, heart rate and blood pressure - to prevent  headaches or reduce headache pain.    Proceed to emergency room if you experience new or worsening symptoms or symptoms do not resolve, if you have new neurologic symptoms or if headache is severe, or for any concerning symptom.     Sarina Ill, MD  [facility] [address] [place], [place] [address]  Phone [phone] Fax  [phone]  

## 2015-05-03 ENCOUNTER — Telehealth: Payer: Self-pay

## 2015-05-03 MED ORDER — BUPROPION HCL ER (XL) 150 MG PO TB24: 150.0000 mg | ORAL_TABLET | Freq: Every day | ORAL | Status: DC

## 2015-05-03 MED FILL — BUPROPION HCL XL 150 MG TAB: 150 | 90 days supply | Qty: 90 | Fill #0

## 2015-05-03 NOTE — Telephone Encounter (Signed)
Pharm reqs that Dr Marin Comment refill pt's bupropion xl 150mg  QD. Dr Marin Comment, You have not Rxd this for pt before, looks like pt just est w/you as PCP in Jan. Depression was listed on pt's problem list at [place]. Do you want to Rx? Bupropion 75 BID was on med list at [place], but pharm is requesting bupropion XL 150 Qam. I will pend this way.

## 2015-05-03 NOTE — Telephone Encounter (Signed)
Dr Marin Comment gave me her verbal OK and I sent Rx to pharm.

## 2015-05-05 ENCOUNTER — Encounter: Payer: Self-pay | Admitting: Neurology

## 2015-05-08 DIAGNOSIS — Z6841 Body Mass Index (BMI) 40.0 and over, adult: Secondary | ICD-10-CM | POA: Diagnosis not present

## 2015-05-08 DIAGNOSIS — Z01419 Encounter for gynecological examination (general) (routine) without abnormal findings: Secondary | ICD-10-CM | POA: Diagnosis not present

## 2015-05-13 ENCOUNTER — Encounter: Payer: Self-pay | Admitting: Family Medicine

## 2015-05-13 ENCOUNTER — Encounter: Payer: Self-pay | Admitting: Neurology

## 2015-05-14 ENCOUNTER — Encounter: Payer: Self-pay | Admitting: Neurology

## 2015-05-14 ENCOUNTER — Other Ambulatory Visit: Payer: Self-pay | Admitting: Neurology

## 2015-05-14 ENCOUNTER — Encounter: Payer: Self-pay | Admitting: *Deleted

## 2015-05-14 MED ORDER — ALPRAZOLAM 0.5 MG PO TABS: 0.5000 mg | ORAL_TABLET | Freq: Two times a day (BID) | ORAL | Status: DC | PRN

## 2015-05-14 NOTE — Progress Notes (Signed)
Faxed printed rx xanax to [facility]. Received confirmation.

## 2015-05-15 ENCOUNTER — Ambulatory Visit
Admission: RE | Admit: 2015-05-15 | Discharge: 2015-05-15 | Disposition: A | Discharge status: Home / Self Care | Payer: 59 | Source: Ambulatory Visit | Attending: Neurology | Admitting: Neurology

## 2015-05-15 ENCOUNTER — Telehealth: Payer: Self-pay | Admitting: *Deleted

## 2015-05-15 ENCOUNTER — Other Ambulatory Visit: Payer: Self-pay | Admitting: Neurology

## 2015-05-15 DIAGNOSIS — G932 Benign intracranial hypertension: Secondary | ICD-10-CM

## 2015-05-15 DIAGNOSIS — R51 Headache: Secondary | ICD-10-CM | POA: Diagnosis not present

## 2015-05-15 LAB — PROTEIN, CSF: TOTAL PROTEIN, CSF: 27 mg/dL (ref 15–45)

## 2015-05-15 LAB — GLUCOSE, CSF: Glucose, CSF: 57 mg/dL (ref 43–76)

## 2015-05-15 LAB — CSF CELL COUNT WITH DIFFERENTIAL
RBC Count, CSF: 0 cu mm
TUBE #: 3
WBC, CSF: 1 cu mm (ref 0–5)

## 2015-05-15 MED FILL — ALPRAZolam 0.5 MG TABS: 0.5 | 6 days supply | Qty: 15 | Fill #0

## 2015-05-15 NOTE — Progress Notes (Signed)
Discharge instructions explained to pt. 

## 2015-05-15 NOTE — Discharge Instructions (Signed)

## 2015-05-15 NOTE — Telephone Encounter (Signed)
Margaret Fletcher from [place] called stat lab results. She also faxed results to office. Cell count Glucose: normal 57 Total protein CSF: 27 (normal) WBC: 1 RBC: 0 Neutrophil, lymphocyte, monocyte, eosinophil, other cells: too few to count Rare lymph and mono noted on smear

## 2015-05-17 ENCOUNTER — Telehealth: Payer: Self-pay | Admitting: *Deleted

## 2015-05-17 ENCOUNTER — Emergency Department (HOSPITAL_COMMUNITY)
Admission: EM | Admit: 2015-05-17 | Discharge: 2015-05-17 | Disposition: A | Discharge status: Home / Self Care | Payer: 59 | Attending: Emergency Medicine | Admitting: Emergency Medicine

## 2015-05-17 ENCOUNTER — Other Ambulatory Visit: Payer: 59

## 2015-05-17 ENCOUNTER — Encounter (HOSPITAL_COMMUNITY): Payer: Self-pay

## 2015-05-17 DIAGNOSIS — Z862 Personal history of diseases of the blood and blood-forming organs and certain disorders involving the immune mechanism: Secondary | ICD-10-CM | POA: Diagnosis not present

## 2015-05-17 DIAGNOSIS — Z8742 Personal history of other diseases of the female genital tract: Secondary | ICD-10-CM | POA: Insufficient documentation

## 2015-05-17 DIAGNOSIS — Z8739 Personal history of other diseases of the musculoskeletal system and connective tissue: Secondary | ICD-10-CM | POA: Insufficient documentation

## 2015-05-17 DIAGNOSIS — Z79899 Other long term (current) drug therapy: Secondary | ICD-10-CM | POA: Diagnosis not present

## 2015-05-17 DIAGNOSIS — R51 Headache: Secondary | ICD-10-CM | POA: Diagnosis present

## 2015-05-17 DIAGNOSIS — F329 Major depressive disorder, single episode, unspecified: Secondary | ICD-10-CM | POA: Insufficient documentation

## 2015-05-17 DIAGNOSIS — Z87440 Personal history of urinary (tract) infections: Secondary | ICD-10-CM | POA: Insufficient documentation

## 2015-05-17 DIAGNOSIS — Z8619 Personal history of other infectious and parasitic diseases: Secondary | ICD-10-CM | POA: Diagnosis not present

## 2015-05-17 DIAGNOSIS — G971 Other reaction to spinal and lumbar puncture: Secondary | ICD-10-CM | POA: Diagnosis not present

## 2015-05-17 DIAGNOSIS — Z872 Personal history of diseases of the skin and subcutaneous tissue: Secondary | ICD-10-CM | POA: Diagnosis not present

## 2015-05-17 DIAGNOSIS — G43909 Migraine, unspecified, not intractable, without status migrainosus: Secondary | ICD-10-CM | POA: Insufficient documentation

## 2015-05-17 MED ORDER — METOCLOPRAMIDE HCL 5 MG/ML IJ SOLN
10.0000 mg | Freq: Once | INTRAMUSCULAR | Status: AC
  Administered 2015-05-17: 10 mg via INTRAVENOUS
  Filled 2015-05-17: qty 2

## 2015-05-17 MED ORDER — PROCHLORPERAZINE EDISYLATE 5 MG/ML IJ SOLN
10.0000 mg | Freq: Once | INTRAMUSCULAR | Status: AC
  Administered 2015-05-17: 10 mg via INTRAVENOUS
  Filled 2015-05-17: qty 2

## 2015-05-17 MED ORDER — DIPHENHYDRAMINE HCL 50 MG/ML IJ SOLN
25.0000 mg | Freq: Once | INTRAMUSCULAR | Status: AC
  Administered 2015-05-17: 25 mg via INTRAVENOUS
  Filled 2015-05-17: qty 1

## 2015-05-17 MED ORDER — KETOROLAC TROMETHAMINE 30 MG/ML IJ SOLN
30.0000 mg | Freq: Once | INTRAMUSCULAR | Status: AC
  Administered 2015-05-17: 30 mg via INTRAVENOUS
  Filled 2015-05-17: qty 1

## 2015-05-17 MED ORDER — SODIUM CHLORIDE 0.9 % IV BOLUS (SEPSIS)
1000.0000 mL | Freq: Once | INTRAVENOUS | Status: AC
  Administered 2015-05-17: 1000 mL via INTRAVENOUS

## 2015-05-17 NOTE — Telephone Encounter (Signed)
Called pt and relayed results per Dr Jaynee Eagles note.  She verbalized understanding. She stated she was just about to call because she has a lot of pressure in her head. She laid flat for 24 hr and she got up today and went to doctor's apt. She feels like it is a lot of "pressure". Asked if laying down relieved pressure and she was not sure. She hasn't laid down since she got out of bed this morning. She had tea last night and water today. Has not had any caffeine. While on bed rest, she was turning side to side and her ears were draining clear liquid. This has stopped since. Advised I will speak to Dr Jaynee Eagles and call back to advise. She verbalized understanding. She was on her way to a doctor's apt.

## 2015-05-17 NOTE — ED Provider Notes (Signed)
CSN: [id number]     Arrival date & time 05/17/15  1659 History   First MD Initiated Contact with Patient 05/17/15 1754     Chief Complaint  Patient presents with  . Migraine    HPI  Margaret Fletcher is an 45 y.o. female with history of chronic migraines, autoimmune disease, depression who presents to the ED for evaluation of headache. Pt states that she had an LP done two days ago. States that she had initially been recovering well. However, this AM around 10:00 she was using the restroom and after she had a BM started experiencing severe, diffuse "pressure headache." She describes the pain as an intense pressure that goes from her frontal area to her occiput. States her ears feel full. Endorses phonophobia but denies photophobia, visual disturbance, hearing changes, n/v. States this feels different than her typical migraine. She reports she has taken percocet with no relief. Denies new fever or chills. Denies new numbness, weakness, tingling, gait instability.   Past Medical History  Diagnosis Date  . Alopecia   . Autoimmune disease ([place])   . Bacterial vaginosis   . Migraine headache   . Frequent UTI   . Cyst of spleen   . Depression   . Hypertrophy of breast   . Migraine   . Depression    Past Surgical History  Procedure Laterality Date  . Cesarean section    . Cholecystectomy    . Appendectomy    . Cesarean section    . Wisdom tooth extraction    . Tubal ligation    . Dilitation & currettage/hystroscopy with novasure ablation N/A 07/19/2012    Procedure: HYSTEROSCOPY WITH NOVASURE ABLATION;  Surgeon: Farrel Gobble. Harrington Challenger, MD;  Location: [facility];  Service: Gynecology;  Laterality: N/A;  . Endometrial ablation    . Breath tek h pylori N/A 05/21/2014    Procedure: BREATH TEK H PYLORI;  Surgeon: Greer Pickerel, MD;  Location: [facility];  Service: General;  Laterality: N/A;  . Breast reduction surgery Bilateral 03/26/2015    Procedure: BILATERAL BREAST REDUCTION  ;  Surgeon: Youlanda Roys,  MD;  Location: [facility];  Service: Plastics;  Laterality: Bilateral;   Family History  Problem Relation Age of Onset  . Cancer Mother   . Stroke Father   . Diabetes Brother   . Seizures Son   . Migraines Neg Hx    Social History  Substance Use Topics  . Smoking status: Never Smoker   . Smokeless tobacco: Never Used  . Alcohol Use: Yes     Comment: socially   OB History    No data available     Review of Systems  All other systems reviewed and are negative.     Allergies  Sulfa antibiotics  Home Medications   Prior to Admission medications   Medication Sig Start Date End Date Taking? Authorizing Provider  ALPRAZolam Duanne Moron) 0.5 MG tablet Take 1 tablet (0.5 mg total) by mouth 2 (two) times daily as needed for anxiety. Can take 1-2 30 minutes before MRI. Do not drive. 05/14/15   Melvenia Beam, MD  buPROPion (WELLBUTRIN XL) 150 MG 24 hr tablet Take 1 tablet (150 mg total) by mouth daily. 05/03/15   Thao P Le, DO  Multiple Vitamins-Minerals (ZINC PO) Take 1 tablet by mouth daily.     Historical Provider, MD  OXYCODONE HCL PO Take by mouth.    Historical Provider, MD  SUMAtriptan (IMITREX) 100 MG tablet Take 1 tablet (  100 mg total) by mouth once as needed for migraine. May repeat in 2 hours if headache persists or recurs. 04/30/15   Melvenia Beam, MD  topiramate (TOPAMAX) 25 MG tablet Take 4 tablets (100 mg total) by mouth at bedtime. 04/30/15   Melvenia Beam, MD  TRAMADOL HCL PO Take by mouth.    Historical Provider, MD   BP 169/111 mmHg  Pulse 79  Temp(Src) 98.6 F (37 C)  Resp 20  SpO2 100% Physical Exam  Constitutional: She is oriented to person, place, and time.  Tearful, appears uncomfortable  HENT:  Head: Atraumatic.  Right Ear: Tympanic membrane, external ear and ear canal normal.  Left Ear: Tympanic membrane, external ear and ear canal normal.  Nose: Nose normal.  Eyes: Conjunctivae and EOM are normal. Pupils are equal, round, and reactive  to light. No scleral icterus.  No horizontal or vertical nystagmus  Neck: Full passive range of motion without pain. Neck supple.  No meniningismus  Cardiovascular: Normal rate and regular rhythm.   Pulmonary/Chest: Effort normal. No respiratory distress. She exhibits no tenderness.  Abdominal: Soft. She exhibits no distension. There is no tenderness.  Lymphadenopathy:    She has no cervical adenopathy.  Neurological: She is alert and oriented to person, place, and time. She has normal strength. She displays no tremor. No cranial nerve deficit or sensory deficit. Coordination normal.  Normal finger to nose, no drift  Skin: Skin is warm and dry. She is not diaphoretic.  Psychiatric: She has a normal mood and affect. Her behavior is normal.  Nursing note and vitals reviewed.  Filed Vitals:   05/17/15 1726 05/17/15 1912  BP: 169/111 117/75  Pulse: 79 56  Temp: 98.6 F (37 C) 98.3 F (36.8 C)  TempSrc:  Oral  Resp: 20 18  SpO2: 100% 100%     ED Course  Procedures (including critical care time) Labs Review Labs Reviewed - No data to display  Imaging Review No results found. I have personally reviewed and evaluated these images and lab results as part of my medical decision-making.   EKG Interpretation None      MDM   Final diagnoses:  Post lumbar puncture headache    Nonfocal neuro exam. Pt is afebrile, no meningismus. Suspect post LP headache. Will give fluids and headache cocktail.  Pain minimally improved with initial meds. Will add compazine. Pt requesting I speak to neurology to touch base, ask if we need emergent MRI. I continue to suspect this is post LP headache and am not impressed, not concerned about new acute intracranial emergent pathology. Will consult neuro.  I spoke to Dr. Leonel Ramsay who agrees with Dr. Cathren Laine previous documentation of caffeine, fluids, and f/u in clinic Monday for possible blood patch. No indication for emergent brain imaging.  Discussed with pt who verbalized her agreement and understanding.  [name], PA-C 05/17/15 2013  Charlesetta Shanks, MD 05/22/15 1014

## 2015-05-17 NOTE — ED Notes (Signed)
Patient reports that she had a LP 2 days ago and was fine until today. Patient states she had a BM and afterwards began having a migraine with pressure to the top of her head, posterior neck that radiates into the shoulder blades and feels like her ears "Need to pop."

## 2015-05-17 NOTE — Discharge Instructions (Signed)
You were seen in the ER today for evaluation of a headache. I suspect this is related to the lumbar puncture you had done two days ago. I spoke to the on-call neurologist who agrees with a conservative approach for now--lots of fluids, caffeine, imitrex as prescribed. Try to lay flat as much as possible. If your headache persists please call Dr. Cathren Laine office as you might need the blood patch that she talked to you about. Return to the ER for new or worsening symptoms.   Epidural Blood Patch for Spinal Headache An epidural blood patch is a procedure that is used to treat a headache that occurs when there is a leak of spinal fluid. This type of headache is called a spinal headache or post-dural puncture headache. Spinal headaches sometimes occur after a person undergoes a type of anesthesia called epidural anesthesia or after a lumbar puncture (also called a spinal tap).  Generally, an epidural blood patch is done when a spinal headache has not been relieved by 2-3 days of:   Bed rest.   Drinking lots of fluids.   Taking oral medicines for pain, including nonsteroidal anti-inflammatory agents and caffeine. It may also be done to treat a person who has had epidural anesthesia and is experiencing:   Neck pain and stiffness that are very severe and associated with vomiting.   Hearing loss.   Double vision.  An epidural blood patch is not done when:   Your headache is due to an infection in the lower back (lumbar) area or the blood.   You have bleeding tendencies.   You are taking certain blood-thinning medicines. LET [facility] CARE PROVIDER KNOW ABOUT:  Any allergies you have.   All medicines you are taking, including vitamins, herbs, eye drops, creams, and over-the-counter medicines.   Previous problems you or members of your family have had with the use of anesthetics.   Any blood disorders you have.   Previous surgeries you have had.   Medical conditions you have.   RISKS AND COMPLICATIONS Generally, an epidural blood patch is a safe procedure. However, as with any procedure, complications can occur. Possible complications include:   Backache.  Nerve pain, tingling, or numbness.  Bleeding.  Infection. Complications are more likely to occur in people who have bleeding disorders or infections. BEFORE THE PROCEDURE  Drink a lot of water the day before your procedure.  Make sure your health care provider knows about all medicines and dietary or herbal supplements that you are taking. Take them as directed and find out if you need to stop any of them prior to the procedure.  Follow your health care provider's instructions for when to stop eating and drinking.  Arrange for someone to drive you to and from the procedure. PROCEDURE   You will have two IV lines placed--one to give you fluids and medicines during the procedure and one to withdraw blood for the patch.  You will lie on your stomach.  An X-ray machine will take pictures of your back to locate the area of leakage.  Dye may be injected so that the area can be seen well on an X-ray.  Blood will be drawn from your arm and injected into the leaking area. When the blood is injected, you may feel tightness in your buttocks, lower back, or thighs. AFTER THE PROCEDURE  You will be expected to lie on your back for 2-4 hours with some pillows under your knees. It is important to lie still while  on your back so that a good clot can form. You should also avoid any straining, especially right after the procedure.  Most people obtain almost instant relief from the spinal headache. In some, the relief comes on gradually over a 24-hour period. Some people experience mild backaches for a few days. In a few cases, people also have a mild, passing sensation of prickly or tingly skin (paresthesia), neck pain, or nerve-root pain.   This information is not intended to replace advice given to you by your  health care provider. Make sure you discuss any questions you have with your health care provider.   Document Released: 08/15/2001 Document Revised: 12/14/2012 Document Reviewed: 10/05/2012 Elsevier Interactive Patient Education [employer].

## 2015-05-17 NOTE — Telephone Encounter (Signed)
-----   Message from Melvenia Beam, MD sent at 05/16/2015  5:39 PM EST ----- Opening pressure for lumbar puncture was 23. Technically it is very slightly elevated as 20 is the typical limit. Still this is not very increased and 23 can actually be normal for someone who has a larger body. I would like to wait and see what the MRI of the brain looks like before making any determination. Thanks.

## 2015-05-17 NOTE — Telephone Encounter (Signed)
Spoke to boyfriend.Patient is sleeping.  She has a bad headache. She is drinking water and tea. Headache is worse when standing up. Explained this is likely a post lumbar headache. Spoke to [place] her boyfriend. She is to lay falt, drink caffeine and take in fluids. May need a blood patch on Monday. Go driectly to the ED for worsening or new symptoms especially AMS, diplopia, facial droop or any focal neurologic symptoms.

## 2015-05-17 NOTE — Telephone Encounter (Signed)
Dr Ahern- please advise 

## 2015-05-18 ENCOUNTER — Encounter: Payer: Self-pay | Admitting: Neurology

## 2015-05-18 LAB — CSF CULTURE W GRAM STAIN
Gram Stain: NONE SEEN
Organism ID, Bacteria: NO GROWTH

## 2015-05-18 LAB — CSF CULTURE: GRAM STAIN: NONE SEEN

## 2015-05-19 ENCOUNTER — Encounter: Payer: Self-pay | Admitting: Neurology

## 2015-05-20 ENCOUNTER — Telehealth: Payer: Self-pay | Admitting: Neurology

## 2015-05-20 ENCOUNTER — Encounter: Payer: Self-pay | Admitting: *Deleted

## 2015-05-20 ENCOUNTER — Encounter: Payer: Self-pay | Admitting: Neurology

## 2015-05-20 ENCOUNTER — Telehealth: Payer: Self-pay | Admitting: Family Medicine

## 2015-05-20 DIAGNOSIS — G971 Other reaction to spinal and lumbar puncture: Secondary | ICD-10-CM

## 2015-05-20 NOTE — Telephone Encounter (Signed)
Spoke with patient.

## 2015-05-20 NOTE — Telephone Encounter (Signed)
See other phone note. Placed order for blood patch. Sent mychart message to pt and called [facility] to call pt to schedule.

## 2015-05-20 NOTE — Telephone Encounter (Signed)
Pt called sts she cannot be out of work. She just came back from being out 6 weeks and must work. She wants to have the blood patch asap. Sts she does not want to take anymore medication.She is also helping her partner with chemo visits and if she can't be reached please leave a message on her phone or send it thru mychart.

## 2015-05-20 NOTE — Telephone Encounter (Signed)
Placed order for blood patch. Ok per Dr Jaynee Eagles. Sent message to pt thru mychart. Called [facility]. LVM for [place] (spine services) to call pt to schedule. Gave pt name and DOB.

## 2015-05-20 NOTE — Telephone Encounter (Signed)
Order a lumbar patch for patient. She declined trying any medication for headache. I do think a blood patch is the best to help with her post lp headache.

## 2015-05-20 NOTE — Telephone Encounter (Signed)
Spoke with patient, she is planning to get an MRI and blood patch tomorrow s/p LP HA, being followed by neurology for this.  Since there are so many moving parts, recent breast reduction surgery, LP HA, hx of HAs , etc and other relationship stressors I have advised her it would be best to keep with wellbutrin 150 XL for now, she states that her HAs and BP have not been worsened by restarting wellbutrin but she does not know for sure.  We will consider increasing her wellbutrin XL 150 mg to 300 mg to see if this helps with depression/irritability since she feels wellbutrin is not effective at 150 mg dose. I would like her to start that several days after getting blood patch rather than today in case there are any SEs adn we are unable to distinguish from all the changes. She also has not been sleeping well, 2 hours last night, she is taking Xanax but makes her sleepy during the day. I have asked her to try taking it at night to help with sleep, if she wants to take it during the day then can break it in 1/2 if makes her too drowsy. She will call me to give me any upate, if we need to change her antidepressant to something else then will need to taper wellbutrin . Advise her not to miss any doses which can cause withdrawal sxs as well. She states she only missed 1-2 day at a time when she has procedures done.

## 2015-05-21 ENCOUNTER — Ambulatory Visit
Admission: RE | Admit: 2015-05-21 | Discharge: 2015-05-21 | Disposition: A | Discharge status: Home / Self Care | Payer: 59 | Source: Ambulatory Visit | Attending: Neurology | Admitting: Neurology

## 2015-05-21 DIAGNOSIS — G971 Other reaction to spinal and lumbar puncture: Secondary | ICD-10-CM

## 2015-05-21 DIAGNOSIS — R51 Headache: Secondary | ICD-10-CM | POA: Diagnosis not present

## 2015-05-21 MED ORDER — IOHEXOL 180 MG/ML  SOLN
1.0000 mL | Freq: Once | INTRAMUSCULAR | Status: AC | PRN
  Administered 2015-05-21: 1 mL via EPIDURAL

## 2015-05-21 NOTE — Discharge Instructions (Signed)

## 2015-05-21 NOTE — Progress Notes (Signed)
20cc blood drawn from right AC space for Epidural Blood Patch; site unremarkable.  Brita Romp, RN

## 2015-05-24 ENCOUNTER — Ambulatory Visit
Admission: RE | Admit: 2015-05-24 | Discharge: 2015-05-24 | Disposition: A | Discharge status: Home / Self Care | Payer: 59 | Source: Ambulatory Visit | Attending: Neurology | Admitting: Neurology

## 2015-05-24 DIAGNOSIS — R430 Anosmia: Secondary | ICD-10-CM | POA: Diagnosis not present

## 2015-05-24 DIAGNOSIS — H539 Unspecified visual disturbance: Secondary | ICD-10-CM

## 2015-05-24 DIAGNOSIS — R519 Headache, unspecified: Secondary | ICD-10-CM

## 2015-05-24 DIAGNOSIS — H9190 Unspecified hearing loss, unspecified ear: Secondary | ICD-10-CM

## 2015-05-24 DIAGNOSIS — R51 Headache: Secondary | ICD-10-CM | POA: Diagnosis not present

## 2015-05-24 DIAGNOSIS — G43719 Chronic migraine without aura, intractable, without status migrainosus: Secondary | ICD-10-CM

## 2015-05-24 MED ORDER — GADOBENATE DIMEGLUMINE 529 MG/ML IV SOLN
20.0000 mL | Freq: Once | INTRAVENOUS | Status: AC | PRN
  Administered 2015-05-24: 20 mL via INTRAVENOUS

## 2015-05-27 ENCOUNTER — Encounter: Payer: Self-pay | Admitting: Family Medicine

## 2015-05-27 ENCOUNTER — Other Ambulatory Visit: Payer: Self-pay | Admitting: Neurology

## 2015-05-27 ENCOUNTER — Telehealth: Payer: Self-pay | Admitting: Neurology

## 2015-05-27 DIAGNOSIS — G932 Benign intracranial hypertension: Secondary | ICD-10-CM

## 2015-05-27 MED ORDER — TOPIRAMATE 100 MG PO TABS: 200.0000 mg | ORAL_TABLET | Freq: Every day | ORAL | Status: DC

## 2015-05-27 NOTE — Addendum Note (Signed)
Addended by: Sarina Ill B on: 05/27/2015 07:24 PM   Modules accepted: Orders

## 2015-05-27 NOTE — Telephone Encounter (Signed)
Emma, please call and schedule appointment follow up with me for next week or the week after. thanks

## 2015-05-27 NOTE — Telephone Encounter (Signed)
Discussed MRi with patient. Given findings of elevated opening pressure on LP and MRi c/w IIH, will increase Toapmax to 200mg  qhs. Spoke topatient. She has a sulfa allergy, cannot use diamox. Wil refer to ophthalmology.   IMPRESSION: This MRI of the brain with and without contrast shows the following: 1. Brain parenchyma is normal before and after contrast administration. 2. There is flattening of the pituitary gland within a mildly enlarged sella turcica and widening of the optic nerves sheaths. This could be seen with idiopathic intracranial hypertension (pseudotumor cerebri) 3. There are no acute findings and there is a normal enhancement pattern.

## 2015-05-28 MED FILL — TOPIRAMATE 100 MG TABLET: 100 | 30 days supply | Qty: 60 | Fill #0

## 2015-05-28 NOTE — Telephone Encounter (Signed)
Called pt and made f/u for Monday at 8am, check in 745am.  Pt requested morning appt. Advised rx topamax called into pharmacy yesterday. She verbalized understanding.

## 2015-06-03 ENCOUNTER — Ambulatory Visit (INDEPENDENT_AMBULATORY_CARE_PROVIDER_SITE_OTHER): Payer: 59 | Admitting: Neurology

## 2015-06-03 ENCOUNTER — Encounter: Payer: Self-pay | Admitting: Neurology

## 2015-06-03 ENCOUNTER — Other Ambulatory Visit: Payer: Self-pay | Admitting: Family Medicine

## 2015-06-03 VITALS — BP 149/97 | HR 88 | Ht 68.0 in | Wt 279.4 lb

## 2015-06-03 DIAGNOSIS — G932 Benign intracranial hypertension: Secondary | ICD-10-CM | POA: Diagnosis not present

## 2015-06-03 DIAGNOSIS — Z7689 Persons encountering health services in other specified circumstances: Secondary | ICD-10-CM

## 2015-06-03 MED ORDER — TOPIRAMATE 100 MG PO TABS: 150.0000 mg | ORAL_TABLET | Freq: Two times a day (BID) | ORAL | Status: DC

## 2015-06-03 MED ORDER — TOPIRAMATE 100 MG PO TABS: 100.0000 mg | ORAL_TABLET | Freq: Two times a day (BID) | ORAL | Status: DC

## 2015-06-03 NOTE — Progress Notes (Addendum)
[facility]    Provider:  Dr Jaynee Eagles Referring Provider: Glenford Bayley, DO Primary Care Physician:  Leotis Pain, DO   CC: Headaches  Interval update 06/03/2015: Patient with obesity and intracranial idiopathic hypertension here for follow up.Repeat MRI consistent with IIH (There is flattening of the pituitary gland within a mildly enlarged sella turcica and widening of the optic nerves sheaths) and elevated opening pressure on LP(23cm).  She is currently On Topamax due to Sulfa allergy and can't take diamox. Discussed weight loss as integral to the management of her condition that can cause intractable headaches and permanent blindness.  She is taking Topamax 200mg  only at night. She feels tired. She has some pins and needles in the arms and feet. She has headaches in the morning. She had a sleep study at [facility] within the last year that did not show OSA. She does not smoke. She is having smelling problems. She has throbbing headaches.  She has vision changes. She has blurry vision. Her husband had a vascectomy and she had cervical abalation and she says she cannot have children at this time. Advised to be careful and use backup due to teratogenicity of topamax. Discussed weight loss as the most important way to help with this disorder. If vision or headaches significantly worsen go directly to the ED.   Addendum 05/27/2015: Repeat MRI consistent with IIH (There is flattening of the pituitary gland within a mildly enlarged sella turcica and widening of the optic nerves sheaths) and elevated opening pressure on LP(23cm). Patient has a sulfa allergy cannot use diamox. Will increase Topamax to 200mg  and then may have to further increase as this is a weaker medication than acetazolamide. Will refer to Dr. Midge Aver for evaluation.   HPI: Margaret Fletcher is a 45 y.o. female here as a referral from Dr. Marin Comment for headaches. PMHx morbid obesity, migraines, depression. She has had  headaches since [facility], no inciting events or trauma. In the last several years they are unilateral, she gets unilateral ptosis, she has throbbing, light and noise sensitivity, nausea, dizziness, vertigo. She have worsening anosmia and food smells different, taste is affected. Worsening headaches. She has headache almost every day and maybe 4-5 a month that are severe and migrainous. Worsening over the last year. She takes Excedrin migraine almost daily. She gets unilateral vision changes, blurriness with the headaches. Worst headache can last 12 hours, 10/10, no vomiting. She has had to leave work early. She has right ear hearing changes. She had a sleep study completed at [facility] last year.   Reviewed notes, labs and imaging from outside physicians, which showed:  MRI of the brain 06/2011: Personally reviewed and agree with the following IMPRESSION: No acute intracranial findings.  No visible white matter disease, specifically no evidence for complicated migraine.  Abnormally increased amount of cerebrospinal fluid in the sella turcica with flattening of the pituitary gland against the floor.This appearance is consistent with a diagnosis of empty sella, although the relationship of this abnormality to the patient's clinical symptomatology is not established.    Review of Systems: Patient complains of symptoms per HPI as well as the following symptoms: memory loss, headache Pertinent negatives per HPI. All others negative.  Social History   Social History  . Marital Status: Divorced    Spouse Name: N/A  . Number of Children: 2  . Years of Education: 16   Occupational History  . [employer]- nurse tech    Social  History Main Topics  . Smoking status: Never Smoker   . Smokeless tobacco: Never Used  . Alcohol Use: Yes     Comment: socially  . Drug Use: No  . Sexual Activity: Yes    Birth Control/ Protection: Surgical   Other Topics Concern  . Not on file   Social  History Narrative   Lives with partner   Caffeine use: minimal coffee    Family History  Problem Relation Age of Onset  . Cancer Mother   . Stroke Father   . Diabetes Brother   . Seizures Son   . Migraines Neg Hx     Past Medical History  Diagnosis Date  . Alopecia   . Autoimmune disease ([place])   . Bacterial vaginosis   . Migraine headache   . Frequent UTI   . Cyst of spleen   . Depression   . Hypertrophy of breast   . Migraine   . Depression     Past Surgical History  Procedure Laterality Date  . Cesarean section    . Cholecystectomy    . Appendectomy    . Cesarean section    . Wisdom tooth extraction    . Tubal ligation    . Dilitation & currettage/hystroscopy with novasure ablation N/A 07/19/2012    Procedure: HYSTEROSCOPY WITH NOVASURE ABLATION;  Surgeon: Farrel Gobble. Harrington Challenger, MD;  Location: [facility];  Service: Gynecology;  Laterality: N/A;  . Endometrial ablation    . Breath tek h pylori N/A 05/21/2014    Procedure: BREATH TEK H PYLORI;  Surgeon: Greer Pickerel, MD;  Location: [facility];  Service: General;  Laterality: N/A;  . Breast reduction surgery Bilateral 03/26/2015    Procedure: BILATERAL BREAST REDUCTION  ;  Surgeon: Youlanda Roys, MD;  Location: [facility];  Service: Plastics;  Laterality: Bilateral;    Current Outpatient Prescriptions  Medication Sig Dispense Refill  . ALPRAZolam (XANAX) 0.5 MG tablet Take 1 tablet (0.5 mg total) by mouth 2 (two) times daily as needed for anxiety. Can take 1-2 30 minutes before MRI. Do not drive. 15 tablet 0  . buPROPion (WELLBUTRIN XL) 150 MG 24 hr tablet Take 1 tablet (150 mg total) by mouth daily. 90 tablet 1  . Multiple Vitamins-Minerals (ZINC PO) Take 1 tablet by mouth daily.     . OXYCODONE HCL PO Take by mouth.    . SUMAtriptan (IMITREX) 100 MG tablet Take 1 tablet (100 mg total) by mouth once as needed for migraine. May repeat in 2 hours if headache persists or recurs. 10 tablet 12  . topiramate  (TOPAMAX) 100 MG tablet Take 2 tablets (200 mg total) by mouth at bedtime. 60 tablet 11  . TRAMADOL HCL PO Take by mouth.     No current facility-administered medications for this visit.    Allergies as of 06/03/2015 - Review Complete 06/03/2015  Allergen Reaction Noted  . Sulfa antibiotics Anaphylaxis 01/18/2011    Vitals: BP 149/97 mmHg  Pulse 88  Ht 5\' 8"  (1.727 m)  Wt 279 lb 6.4 oz (126.735 kg)  BMI 42.49 kg/m2 Last Weight:  Wt Readings from Last 1 Encounters:  06/03/15 279 lb 6.4 oz (126.735 kg)   Last Height:   Ht Readings from Last 1 Encounters:  06/03/15 5\' 8"  (1.727 m)    Neuro: Detailed Neurologic Exam  Speech:  Speech is normal; fluent and spontaneous with normal comprehension.  Cognition:  The patient is oriented to person, place, and time;  recent and remote memory intact;   language fluent;   normal attention, concentration,   fund of knowledge Cranial Nerves:  The pupils are equal, round, and reactive to light. The fundi are normal and spontaneous venous pulsations are present. Visual fields are full to finger confrontation. Extraocular movements are intact. Trigeminal sensation is intact and the muscles of mastication are normal. The face is symmetric. The palate elevates in the midline. Hearing intact. Voice is normal. Shoulder shrug is normal. The tongue has normal motion without fasciculations.   Coordination:  Normal finger to nose and heel to shin. Normal rapid alternating movements.   Gait:  Heel-toe and tandem gait are normal.   Motor Observation:  No asymmetry, no atrophy, and no involuntary movements noted. Tone:  Normal muscle tone.   Posture:  Posture is normal. normal erect   Strength:  Strength is V/V in the upper and lower limbs.    Sensation: intact to LT   Reflex Exam:  DTR's:  Deep tendon reflexes in the upper and lower extremities are normal bilaterally.  Toes:  The toes  are downgoing bilaterally.  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   Addendum 05/27/2015: Repeat MRI consistent with IIH (There is flattening of the pituitary gland within a mildly enlarged sella turcica and widening of the optic nerves sheaths) and elevated opening pressure on LP(23cm). Patient has a sulfa allergy cannot use diamox. Will increase Topamax to 150mg  bid and then may have to further increase as this is a weaker medication than acetazolamide. Will refer to Dr. Midge Aver for evaluation.   Patient with morbid and intracranial idiopathic hypertension(IIH) here for follow up. Discussed weight loss as integral to the management of this condition that can cause intractable headaches and permanent blindness.  Obesity is a major risk factor for the development of IIH and losing weight may cure this patient's IIH and prevent permanent vision loss/blindness. Recommend weight loss to normal BMI of 25. Bariatric surgery is medically indicated case and recommended.  Some Medication Overuse with Excedrin daily. Advised no more than 2-3x a week.  Lumbar puncture - opening pressure 23 MRi of the brain w/wo contrast - c/w IIH Start Topiramate - increase to 150mg  twice daily(300mg /day). She has an anaphylactic allergy to sulfa, cannot use diamox. Follo wup with Dr. Katy Fitch Discussed IIH and risk of permanent vision loss, proceed to ED directly if vision changes  Discussed Topiramate side effects. Serious side effects can inclue: Teratogenicity, Abdominal or stomach pain ,fever, chills, or sore throat , lessening of sensations or perception ,loss of appetite , mood or mental changes, including aggression, agitation, apathy, irritability, and mental depression , red, irritated, or bleeding gums , weight loss, nephrolithiasis.  Sarina Ill, MD  [facility] [address] [place], [place] [address]  Phone [phone] Fax [phone]  A total of 30 minutes was spent face-to-face with this patient. Over half this time was spent on counseling patient on the IIH diagnosis and different diagnostic and therapeutic options available.

## 2015-06-03 NOTE — Patient Instructions (Signed)
Remember to drink plenty of fluid, eat healthy meals and do not skip any meals. Try to eat protein with a every meal and eat a healthy snack such as fruit or nuts in between meals. Try to keep a regular sleep-wake schedule and try to exercise daily, particularly in the form of walking, 20-30 minutes a day, if you can.   As far as your medications are concerned, I would like to suggest: increase Topiramate to 150mg  twice daily  I would like to see you back in 6 weeks, sooner if we need to. Please call us with any interim questions, concerns, problems, updates or refill requests.   Our phone number is [phone]. We also have an after hours call service for urgent matters and there is a physician on-call for urgent questions. For any emergencies you know to call 911 or go to the nearest emergency room

## 2015-06-05 ENCOUNTER — Emergency Department (HOSPITAL_COMMUNITY): Payer: 59

## 2015-06-05 ENCOUNTER — Emergency Department (HOSPITAL_COMMUNITY)
Admission: EM | Admit: 2015-06-05 | Discharge: 2015-06-05 | Disposition: A | Discharge status: Home / Self Care | Payer: 59 | Attending: Emergency Medicine | Admitting: Emergency Medicine

## 2015-06-05 ENCOUNTER — Encounter (HOSPITAL_COMMUNITY): Payer: Self-pay

## 2015-06-05 DIAGNOSIS — G43909 Migraine, unspecified, not intractable, without status migrainosus: Secondary | ICD-10-CM | POA: Insufficient documentation

## 2015-06-05 DIAGNOSIS — Z79899 Other long term (current) drug therapy: Secondary | ICD-10-CM | POA: Insufficient documentation

## 2015-06-05 DIAGNOSIS — Z872 Personal history of diseases of the skin and subcutaneous tissue: Secondary | ICD-10-CM | POA: Insufficient documentation

## 2015-06-05 DIAGNOSIS — F329 Major depressive disorder, single episode, unspecified: Secondary | ICD-10-CM | POA: Diagnosis not present

## 2015-06-05 DIAGNOSIS — Z8619 Personal history of other infectious and parasitic diseases: Secondary | ICD-10-CM | POA: Insufficient documentation

## 2015-06-05 DIAGNOSIS — Z8742 Personal history of other diseases of the female genital tract: Secondary | ICD-10-CM | POA: Insufficient documentation

## 2015-06-05 DIAGNOSIS — Z8739 Personal history of other diseases of the musculoskeletal system and connective tissue: Secondary | ICD-10-CM | POA: Diagnosis not present

## 2015-06-05 DIAGNOSIS — J111 Influenza due to unidentified influenza virus with other respiratory manifestations: Secondary | ICD-10-CM | POA: Diagnosis not present

## 2015-06-05 DIAGNOSIS — Z87440 Personal history of urinary (tract) infections: Secondary | ICD-10-CM | POA: Insufficient documentation

## 2015-06-05 DIAGNOSIS — F419 Anxiety disorder, unspecified: Secondary | ICD-10-CM | POA: Insufficient documentation

## 2015-06-05 DIAGNOSIS — Z862 Personal history of diseases of the blood and blood-forming organs and certain disorders involving the immune mechanism: Secondary | ICD-10-CM | POA: Insufficient documentation

## 2015-06-05 DIAGNOSIS — R05 Cough: Secondary | ICD-10-CM | POA: Diagnosis not present

## 2015-06-05 DIAGNOSIS — R509 Fever, unspecified: Secondary | ICD-10-CM | POA: Diagnosis not present

## 2015-06-05 DIAGNOSIS — R69 Illness, unspecified: Secondary | ICD-10-CM

## 2015-06-05 LAB — INFLUENZA PANEL BY PCR (TYPE A & B)
H1N1FLUPCR: NOT DETECTED
INFLBPCR: NEGATIVE
Influenza A By PCR: NEGATIVE

## 2015-06-05 LAB — COMPREHENSIVE METABOLIC PANEL
ALBUMIN: 3.6 g/dL (ref 3.5–5.0)
ALT: 22 U/L (ref 14–54)
AST: 24 U/L (ref 15–41)
Alkaline Phosphatase: 64 U/L (ref 38–126)
Anion gap: 9 (ref 5–15)
BUN: 12 mg/dL (ref 6–20)
CHLORIDE: 112 mmol/L — AB (ref 101–111)
CO2: 18 mmol/L — AB (ref 22–32)
CREATININE: 1.25 mg/dL — AB (ref 0.44–1.00)
Calcium: 8.4 mg/dL — ABNORMAL LOW (ref 8.9–10.3)
GFR calc Af Amer: 59 mL/min — ABNORMAL LOW (ref >60)
GFR calc non Af Amer: 51 mL/min — ABNORMAL LOW (ref >60)
GLUCOSE: 94 mg/dL (ref 65–99)
POTASSIUM: 3.8 mmol/L (ref 3.5–5.1)
SODIUM: 139 mmol/L (ref 135–145)
Total Bilirubin: 0.4 mg/dL (ref 0.3–1.2)
Total Protein: 7.2 g/dL (ref 6.5–8.1)

## 2015-06-05 LAB — URINALYSIS, ROUTINE W REFLEX MICROSCOPIC
Bilirubin Urine: NEGATIVE
GLUCOSE, UA: NEGATIVE mg/dL
HGB URINE DIPSTICK: NEGATIVE
KETONES UR: NEGATIVE mg/dL
Leukocytes, UA: NEGATIVE
Nitrite: NEGATIVE
PH: 7.5 (ref 5.0–8.0)
PROTEIN: NEGATIVE mg/dL
Specific Gravity, Urine: 1.017 (ref 1.005–1.030)

## 2015-06-05 LAB — CBC
HEMATOCRIT: 36.9 % (ref 36.0–46.0)
Hemoglobin: 12.2 g/dL (ref 12.0–15.0)
MCH: 30 pg (ref 26.0–34.0)
MCHC: 33.1 g/dL (ref 30.0–36.0)
MCV: 90.7 fL (ref 78.0–100.0)
PLATELETS: 305 10*3/uL (ref 150–400)
RBC: 4.07 MIL/uL (ref 3.87–5.11)
RDW: 13.7 % (ref 11.5–15.5)
WBC: 12.7 10*3/uL — ABNORMAL HIGH (ref 4.0–10.5)

## 2015-06-05 LAB — LIPASE, BLOOD: LIPASE: 27 U/L (ref 11–51)

## 2015-06-05 LAB — RAPID STREP SCREEN (MED CTR MEBANE ONLY): Streptococcus, Group A Screen (Direct): NEGATIVE

## 2015-06-05 MED ORDER — SODIUM CHLORIDE 0.9 % IV BOLUS (SEPSIS)
500.0000 mL | Freq: Once | INTRAVENOUS | Status: AC
  Administered 2015-06-05: 500 mL via INTRAVENOUS

## 2015-06-05 MED ORDER — SODIUM CHLORIDE 0.9 % IV SOLN
INTRAVENOUS | Status: DC
  Administered 2015-06-05: 15:00:00 via INTRAVENOUS

## 2015-06-05 MED ORDER — HYDROCODONE-HOMATROPINE 5-1.5 MG/5ML PO SYRP
10.0000 mL | ORAL_SOLUTION | Freq: Once | ORAL | Status: AC
  Administered 2015-06-05: 10 mL via ORAL
  Filled 2015-06-05: qty 10

## 2015-06-05 MED ORDER — IBUPROFEN 600 MG PO TABS: 600.0000 mg | ORAL_TABLET | Freq: Four times a day (QID) | ORAL | Status: DC | PRN

## 2015-06-05 MED ORDER — ACETAMINOPHEN 500 MG PO TABS
1000.0000 mg | ORAL_TABLET | Freq: Once | ORAL | Status: AC
  Administered 2015-06-05: 1000 mg via ORAL
  Filled 2015-06-05: qty 2

## 2015-06-05 MED ORDER — SODIUM CHLORIDE 0.9 % IV BOLUS (SEPSIS): 500.0000 mL | Freq: Once | INTRAVENOUS | Status: DC

## 2015-06-05 MED ORDER — ONDANSETRON 4 MG PO TBDP: 4.0000 mg | ORAL_TABLET | ORAL | Status: DC | PRN

## 2015-06-05 MED ORDER — HYDROCODONE-HOMATROPINE 5-1.5 MG/5ML PO SYRP: 10.0000 mL | ORAL_SOLUTION | Freq: Four times a day (QID) | ORAL | Status: DC | PRN

## 2015-06-05 MED ORDER — ONDANSETRON HCL 4 MG/2ML IJ SOLN
4.0000 mg | Freq: Once | INTRAMUSCULAR | Status: AC | PRN
  Administered 2015-06-05: 4 mg via INTRAVENOUS
  Filled 2015-06-05: qty 2

## 2015-06-05 MED ORDER — IPRATROPIUM-ALBUTEROL 0.5-2.5 (3) MG/3ML IN SOLN
3.0000 mL | Freq: Once | RESPIRATORY_TRACT | Status: AC
  Administered 2015-06-05: 3 mL via RESPIRATORY_TRACT
  Filled 2015-06-05: qty 3

## 2015-06-05 MED ORDER — ALBUTEROL SULFATE HFA 108 (90 BASE) MCG/ACT IN AERS
2.0000 | INHALATION_SPRAY | Freq: Once | RESPIRATORY_TRACT | Status: AC
  Administered 2015-06-05: 2 via RESPIRATORY_TRACT
  Filled 2015-06-05: qty 6.7

## 2015-06-05 MED ORDER — ACETAMINOPHEN 500 MG PO TABS
1000.0000 mg | ORAL_TABLET | Freq: Four times a day (QID) | ORAL | Status: DC | PRN
Start: 2015-06-05 — End: 2015-06-27

## 2015-06-05 NOTE — ED Notes (Signed)
Pt aware of need for stool sample 

## 2015-06-05 NOTE — Discharge Instructions (Signed)
Suspected Influenza/viral illness, Adult Influenza ("the flu") is a viral infection of the respiratory tract. It occurs more often in winter months because people spend more time in close contact with one another. Influenza can make you feel very sick. Influenza easily spreads from person to person (contagious). CAUSES  Influenza is caused by a virus that infects the respiratory tract. You can catch the virus by breathing in droplets from an infected person's cough or sneeze. You can also catch the virus by touching something that was recently contaminated with the virus and then touching your mouth, nose, or eyes. RISKS AND COMPLICATIONS You may be at risk for a more severe case of influenza if you smoke cigarettes, have diabetes, have chronic heart disease (such as heart failure) or lung disease (such as asthma), or if you have a weakened immune system. Elderly people and pregnant women are also at risk for more serious infections. The most common problem of influenza is a lung infection (pneumonia). Sometimes, this problem can require emergency medical care and may be life threatening. SIGNS AND SYMPTOMS  Symptoms typically last 4 to 10 days and may include:  Fever.  Chills.  Headache, body aches, and muscle aches.  Sore throat.  Chest discomfort and cough.  Poor appetite.  Weakness or feeling tired.  Dizziness.  Nausea or vomiting. DIAGNOSIS  Diagnosis of influenza is often made based on your history and a physical exam. A nose or throat swab test can be done to confirm the diagnosis. TREATMENT  In mild cases, influenza goes away on its own. Treatment is directed at relieving symptoms. For more severe cases, your health care provider may prescribe antiviral medicines to shorten the sickness. Antibiotic medicines are not effective because the infection is caused by a virus, not by bacteria. HOME CARE INSTRUCTIONS  Take medicines only as directed by your health care provider.  Use  a cool mist humidifier to make breathing easier.  Get plenty of rest until your temperature returns to normal. This usually takes 3 to 4 days.  Drink enough fluid to keep your urine clear or pale yellow.  Cover yourmouth and nosewhen coughing or sneezing,and wash your handswellto prevent thevirusfrom spreading.  Stay homefromwork orschool untilthe fever is gonefor at least 63full day. PREVENTION  An annual influenza vaccination (flu shot) is the best way to avoid getting influenza. An annual flu shot is now routinely recommended for all adults in the [place] IF:  You experiencechest pain, yourcough worsens,or you producemore mucus.  Youhave nausea,vomiting, ordiarrhea.  Your fever returns or gets worse. SEEK IMMEDIATE MEDICAL CARE IF:  You havetrouble breathing, you become short of breath,or your skin ornails becomebluish.  You have severe painor stiffnessin the neck.  You develop a sudden headache, or pain in the face or ear.  You have nausea or vomiting that you cannot control. MAKE SURE YOU:   Understand these instructions.  Will watch your condition.  Will get help right away if you are not doing well or get worse.   This information is not intended to replace advice given to you by your health care provider. Make sure you discuss any questions you have with your health care provider.   Document Released: 02/21/2000 Document Revised: 03/16/2014 Document Reviewed: 05/25/2011 Elsevier Interactive Patient Education 2016 Elsevier Inc. Upper Respiratory Infection, Adult Most upper respiratory infections (URIs) are a viral infection of the air passages leading to the lungs. A URI affects the nose, throat, and upper air passages. The most  common type of URI is nasopharyngitis and is typically referred to as "the common cold." URIs run their course and usually go away on their own. Most of the time, a URI does not require medical  attention, but sometimes a bacterial infection in the upper airways can follow a viral infection. This is called a secondary infection. Sinus and middle ear infections are common types of secondary upper respiratory infections. Bacterial pneumonia can also complicate a URI. A URI can worsen asthma and chronic obstructive pulmonary disease (COPD). Sometimes, these complications can require emergency medical care and may be life threatening.  CAUSES Almost all URIs are caused by viruses. A virus is a type of germ and can spread from one person to another.  RISKS FACTORS You may be at risk for a URI if:   You smoke.   You have chronic heart or lung disease.  You have a weakened defense (immune) system.   You are very young or very old.   You have nasal allergies or asthma.  You work in crowded or poorly ventilated areas.  You work in health care facilities or schools. SIGNS AND SYMPTOMS  Symptoms typically develop 2-3 days after you come in contact with a cold virus. Most viral URIs last 7-10 days. However, viral URIs from the influenza virus (flu virus) can last 14-18 days and are typically more severe. Symptoms may include:   Runny or stuffy (congested) nose.   Sneezing.   Cough.   Sore throat.   Headache.   Fatigue.   Fever.   Loss of appetite.   Pain in your forehead, behind your eyes, and over your cheekbones (sinus pain).  Muscle aches.  DIAGNOSIS  Your health care provider may diagnose a URI by:  Physical exam.  Tests to check that your symptoms are not due to another condition such as:  Strep throat.  Sinusitis.  Pneumonia.  Asthma. TREATMENT  A URI goes away on its own with time. It cannot be cured with medicines, but medicines may be prescribed or recommended to relieve symptoms. Medicines may help:  Reduce your fever.  Reduce your cough.  Relieve nasal congestion. HOME CARE INSTRUCTIONS   Take medicines only as directed by your  health care provider.   Gargle warm saltwater or take cough drops to comfort your throat as directed by your health care provider.  Use a warm mist humidifier or inhale steam from a shower to increase air moisture. This may make it easier to breathe.  Drink enough fluid to keep your urine clear or pale yellow.   Eat soups and other clear broths and maintain good nutrition.   Rest as needed.   Return to work when your temperature has returned to normal or as your health care provider advises. You may need to stay home longer to avoid infecting others. You can also use a face mask and careful hand washing to prevent spread of the virus.  Increase the usage of your inhaler if you have asthma.   Do not use any tobacco products, including cigarettes, chewing tobacco, or electronic cigarettes. If you need help quitting, ask your health care provider. PREVENTION  The best way to protect yourself from getting a cold is to practice good hygiene.   Avoid oral or hand contact with people with cold symptoms.   Wash your hands often if contact occurs.  There is no clear evidence that vitamin C, vitamin E, echinacea, or exercise reduces the chance of developing a cold. However,  it is always recommended to get plenty of rest, exercise, and practice good nutrition.  SEEK MEDICAL CARE IF:   You are getting worse rather than better.   Your symptoms are not controlled by medicine.   You have chills.  You have worsening shortness of breath.  You have brown or red mucus.  You have yellow or brown nasal discharge.  You have pain in your face, especially when you bend forward.  You have a fever.  You have swollen neck glands.  You have pain while swallowing.  You have white areas in the back of your throat. SEEK IMMEDIATE MEDICAL CARE IF:   You have severe or persistent:  Headache.  Ear pain.  Sinus pain.  Chest pain.  You have chronic lung disease and any of the  following:  Wheezing.  Prolonged cough.  Coughing up blood.  A change in your usual mucus.  You have a stiff neck.  You have changes in your:  Vision.  Hearing.  Thinking.  Mood. MAKE SURE YOU:   Understand these instructions.  Will watch your condition.  Will get help right away if you are not doing well or get worse.   This information is not intended to replace advice given to you by your health care provider. Make sure you discuss any questions you have with your health care provider.   Document Released: 08/19/2000 Document Revised: 07/10/2014 Document Reviewed: 05/31/2013 Elsevier Interactive Patient Education [employer].

## 2015-06-05 NOTE — ED Provider Notes (Signed)
CSN: [id number]     Arrival date & time 06/05/15  1208 History   First MD Initiated Contact with Patient 06/05/15 1403     Chief Complaint  Patient presents with  . Fever  . Abdominal Pain     (Consider location/radiation/quality/duration/timing/severity/associated sxs/prior Treatment) HPI Patient has developed fever since 3 days ago. She reports it has been waxing and waning. It has been as high as 102 at home. Patient warts in association with that she's had copious diarrhea. She reports multiple episodes of diarrhea overnight. Two since it to the emergency department. No blood. Patient has had nausea with decreased appetite. Mild diffuse abdominal discomfort. Also patient has had cough. No chest pain or shortness of breath. Cough is dry. Patient's last dose of ibuprofen was approximately 11 AM. Patient has history of idiopathic intracranial hypertension. Currently is being treated medically. Patient reports that her headache is worse with coughing but is not significantly worse compared to baseline. Past Medical History  Diagnosis Date  . Alopecia   . Autoimmune disease ([place])   . Bacterial vaginosis   . Migraine headache   . Frequent UTI   . Cyst of spleen   . Depression   . Hypertrophy of breast   . Migraine   . Depression    Past Surgical History  Procedure Laterality Date  . Cesarean section    . Cholecystectomy    . Appendectomy    . Cesarean section    . Wisdom tooth extraction    . Tubal ligation    . Dilitation & currettage/hystroscopy with novasure ablation N/A 07/19/2012    Procedure: HYSTEROSCOPY WITH NOVASURE ABLATION;  Surgeon: Farrel Gobble. Harrington Challenger, MD;  Location: [facility];  Service: Gynecology;  Laterality: N/A;  . Endometrial ablation    . Breath tek h pylori N/A 05/21/2014    Procedure: BREATH TEK H PYLORI;  Surgeon: Greer Pickerel, MD;  Location: [facility];  Service: General;  Laterality: N/A;  . Breast reduction surgery Bilateral 03/26/2015    Procedure: BILATERAL  BREAST REDUCTION  ;  Surgeon: Youlanda Roys, MD;  Location: [place] [facility];  Service: Plastics;  Laterality: Bilateral;   Family History  Problem Relation Age of Onset  . Cancer Mother   . Stroke Father   . Diabetes Brother   . Seizures Son   . Migraines Neg Hx    Social History  Substance Use Topics  . Smoking status: Never Smoker   . Smokeless tobacco: Never Used  . Alcohol Use: Yes     Comment: socially   OB History    No data available     Review of Systems  10 Systems reviewed and are negative for acute change except as noted in the HPI.   Allergies  Sulfa antibiotics  Home Medications   Prior to Admission medications   Medication Sig Start Date End Date Taking? Authorizing Provider  ALPRAZolam Duanne Moron) 0.5 MG tablet Take 1 tablet (0.5 mg total) by mouth 2 (two) times daily as needed for anxiety. Can take 1-2 30 minutes before MRI. Do not drive. 05/14/15  Yes Melvenia Beam, MD  buPROPion (WELLBUTRIN XL) 150 MG 24 hr tablet Take 1 tablet (150 mg total) by mouth daily. 05/03/15  Yes Thao P Le, DO  ibuprofen (ADVIL,MOTRIN) 200 MG tablet Take 800 mg by mouth every 6 (six) hours as needed for moderate pain.   Yes Historical Provider, MD  Multiple Vitamins-Minerals (ZINC PO) Take 1 tablet by mouth daily.  Yes Historical Provider, MD  Phenylephrine-Pheniramine-DM [facility] COLD & COUGH PO) Take 1 packet by mouth daily as needed (cold symptoms).   Yes Historical Provider, MD  SUMAtriptan (IMITREX) 100 MG tablet Take 1 tablet (100 mg total) by mouth once as needed for migraine. May repeat in 2 hours if headache persists or recurs. 04/30/15  Yes Melvenia Beam, MD  topiramate (TOPAMAX) 100 MG tablet Take 1.5 tablets (150 mg total) by mouth 2 (two) times daily. Patient taking differently: Take 100 mg by mouth 2 (two) times daily.  06/03/15  Yes Melvenia Beam, MD  acetaminophen (TYLENOL) 500 MG tablet Take 2 tablets (1,000 mg total) by mouth every 6 (six) hours  as needed. 06/05/15   Charlesetta Shanks, MD  HYDROcodone-homatropine [facility]) 5-1.5 MG/5ML syrup Take 10 mLs by mouth every 6 (six) hours as needed for cough. 06/05/15   Charlesetta Shanks, MD  ibuprofen (ADVIL,MOTRIN) 600 MG tablet Take 1 tablet (600 mg total) by mouth every 6 (six) hours as needed. 06/05/15   Charlesetta Shanks, MD  ondansetron (ZOFRAN ODT) 4 MG disintegrating tablet Take 1 tablet (4 mg total) by mouth every 4 (four) hours as needed for nausea or vomiting. 06/05/15   Charlesetta Shanks, MD   BP 102/62 mmHg  Pulse 89  Temp(Src) 100.8 F (38.2 C) (Oral)  Resp 16  SpO2 100% Physical Exam  Constitutional: She is oriented to person, place, and time. She appears well-developed and well-nourished.  Patient is alert with clear mental status. No respiratory distress. Nontoxic.  HENT:  Head: Normocephalic and atraumatic.  Nose: Nose normal.  Mouth/Throat: Oropharynx is clear and moist.  Eyes: EOM are normal. Pupils are equal, round, and reactive to light.  Neck: Neck supple.  Cardiovascular: Normal rate, regular rhythm, normal heart sounds and intact distal pulses.   Pulmonary/Chest: Effort normal. She has wheezes.  Positive expiratory wheeze. No rhonchi. Good air flow. Cough with deep inspiration.  Abdominal: Soft. Bowel sounds are normal. She exhibits no distension. There is no tenderness.  Musculoskeletal: Normal range of motion. She exhibits no edema.  Neurological: She is alert and oriented to person, place, and time. She has normal strength. Coordination normal. GCS eye subscore is 4. GCS verbal subscore is 5. GCS motor subscore is 6.  Skin: Skin is warm, dry and intact.  Psychiatric: She has a normal mood and affect.    ED Course  Procedures (including critical care time) Labs Review Labs Reviewed  COMPREHENSIVE METABOLIC PANEL - Abnormal; Notable for the following:    Chloride 112 (*)    CO2 18 (*)    Creatinine, Ser 1.25 (*)    Calcium 8.4 (*)    GFR calc non Af Amer 51 (*)     GFR calc Af Amer 59 (*)    All other components within normal limits  CBC - Abnormal; Notable for the following:    WBC 12.7 (*)    All other components within normal limits  URINALYSIS, ROUTINE W REFLEX MICROSCOPIC (NOT AT [facility]) - Abnormal; Notable for the following:    APPearance CLOUDY (*)    All other components within normal limits  RAPID STREP SCREEN (NOT AT [facility])  CULTURE, GROUP A STREP ([place])  GASTROINTESTINAL PANEL BY PCR, STOOL (REPLACES STOOL CULTURE)  LIPASE, BLOOD  INFLUENZA PANEL BY PCR (TYPE A & B, H1N1)    Imaging Review Dg Chest 2 View  06/05/2015  CLINICAL DATA:  Cough for 2-3 days.  Fever. EXAM: CHEST  2 VIEW COMPARISON:  None. FINDINGS: Mild peribronchial thickening. Heart and mediastinal contours are within normal limits. No focal opacities or effusions. No acute bony abnormality. IMPRESSION: Mild bronchitic changes. Electronically Signed   By: Rolm Baptise M.D.   On: 06/05/2015 14:31   I have personally reviewed and evaluated these images and lab results as part of my medical decision-making.   EKG Interpretation None     Recheck: Wheezing has cleared with albuterol. MDM   Final diagnoses:  Influenza-like illness   At this time, most suspect viral syndrome. Patient is combination of respiratory as well as GI symptoms. Cough without hypoxia or chest pain. Chest x-ray does not show focal pneumonia. Will be treated with albuterol and Hycodan for cough suppression. Patient does not have recent antibiotic use or prior history of C. difficile. At this time with combination of upper GI symptoms as well as respiratory symptoms, lower suspicion for Clostridium difficile. Nonsurgical abdominal examination. Patient has known history of idiopathic intracranial hypertension. She reports headache is not worse than baseline headache. It only gets worse with coughing peroxisomal's. No development of neurologic symptoms or neck stiffness. Plan will be for symptomatic treatment and  return precautions are reviewed.    Charlesetta Shanks, MD 06/05/15 [id number]

## 2015-06-05 NOTE — ED Notes (Signed)
Pt here with fever, abdominal pain, emesis, and diarrhea since Monday.  Not able to eat/drink.  No change in urination.

## 2015-06-07 MED FILL — HYDROCODONE-HOMATROPINE SYR: 5-1.5 | 4 days supply | Qty: 180 | Fill #0

## 2015-06-07 MED FILL — ONDANSETRON ODT 4 MG TABLET: 4 | 3 days supply | Qty: 20 | Fill #0

## 2015-06-08 LAB — CULTURE, GROUP A STREP (THRC)

## 2015-06-10 ENCOUNTER — Encounter: Payer: Self-pay | Admitting: Neurology

## 2015-06-12 ENCOUNTER — Encounter: Payer: Self-pay | Admitting: Neurology

## 2015-06-12 ENCOUNTER — Encounter: Payer: Self-pay | Admitting: *Deleted

## 2015-06-14 DIAGNOSIS — H31091 Other chorioretinal scars, right eye: Secondary | ICD-10-CM | POA: Diagnosis not present

## 2015-06-14 DIAGNOSIS — G932 Benign intracranial hypertension: Secondary | ICD-10-CM | POA: Diagnosis not present

## 2015-06-19 ENCOUNTER — Ambulatory Visit (INDEPENDENT_AMBULATORY_CARE_PROVIDER_SITE_OTHER): Payer: 59 | Admitting: Family Medicine

## 2015-06-19 VITALS — BP 140/98 | HR 82 | Temp 98.4°F | Resp 17 | Ht 67.0 in | Wt 270.0 lb

## 2015-06-19 DIAGNOSIS — J209 Acute bronchitis, unspecified: Secondary | ICD-10-CM

## 2015-06-19 LAB — POCT CBC
GRANULOCYTE PERCENT: 71.9 % (ref 37–80)
HEMATOCRIT: 37.6 % — AB (ref 37.7–47.9)
HEMOGLOBIN: 13 g/dL (ref 12.2–16.2)
Lymph, poc: 2 (ref 0.6–3.4)
MCH: 31.1 pg (ref 27–31.2)
MCHC: 34.7 g/dL (ref 31.8–35.4)
MCV: 89.6 fL (ref 80–97)
MID (cbc): 0.5 (ref 0–0.9)
MPV: 7 fL (ref 0–99.8)
PLATELET COUNT, POC: 372 10*3/uL (ref 142–424)
POC GRANULOCYTE: 6.4 (ref 2–6.9)
POC LYMPH %: 22 % (ref 10–50)
POC MID %: 6.1 %M (ref 0–12)
RBC: 4.2 M/uL (ref 4.04–5.48)
RDW, POC: 14.1 %
WBC: 8.9 10*3/uL (ref 4.6–10.2)

## 2015-06-19 MED ORDER — HYDROCOD POLST-CPM POLST ER 10-8 MG/5ML PO SUER: 5.0000 mL | Freq: Every evening | ORAL | Status: DC | PRN

## 2015-06-19 MED ORDER — FLUTICASONE PROPIONATE 50 MCG/ACT NA SUSP: 2.0000 | Freq: Every day | NASAL | Status: DC

## 2015-06-19 MED ORDER — AZITHROMYCIN 250 MG PO TABS: ORAL_TABLET | ORAL | Status: DC

## 2015-06-19 MED ORDER — BENZONATATE 200 MG PO CAPS: 200.0000 mg | ORAL_CAPSULE | Freq: Three times a day (TID) | ORAL | Status: DC | PRN

## 2015-06-19 MED FILL — TOPIRAMATE 100 MG TABLET: 100 | 30 days supply | Qty: 90 | Fill #0

## 2015-06-19 MED FILL — FLUTICASONE PROP 50 MCG SPR: 50 | 30 days supply | Qty: 16 | Fill #0

## 2015-06-19 MED FILL — HYDROCODONE-CHLORPHENIRAM S: 10-8 | 18 days supply | Qty: 90 | Fill #0

## 2015-06-19 MED FILL — BENZONATATE 200 MG CAPSULE: 200 | 10 days supply | Qty: 30 | Fill #0

## 2015-06-19 MED FILL — AZITHROMYCIN 250 MG TABLET: 250 | 5 days supply | Qty: 6 | Fill #0

## 2015-06-19 NOTE — Progress Notes (Signed)
By signing my name below I, Tereasa Coop, attest that this documentation has been prepared under the direction and in the presence of [name], MD. Electonically Signed. Tereasa Coop, Scribe 06/19/2015 at 2:15 PM  Subjective:    Patient ID: Margaret Fletcher, female    DOB: 01/08/1971, 45 y.o.   MRN: [id number]  Chief Complaint  Patient presents with  . Cough  . 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 Pt was prescribed an increased dosage of topamax 3 weeks ago by Dr Katy Fitch in anticipation of pt's widening thalmic nerves. Pt reports mild improvement of her HAs with increased topamax.    ROSANNE BARKUS is a 45 y.o. female who presents to the [facility] complaining of cough. Pt states that she had the flu 10 days ago and was dx with the flu at the ED. Pt states that her cough has persisted despite treatment with prescribed cough syrup, hycodan and tamiflu. 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 Pt states her cough worsens her chronic HA. Pt denies CP or SOB. Pt does report chest irritation when she coughs. Pt reports feeling mild wheeze. Pt denies history of asthma or smoking. Pt denies sinus congestion or drainage. Pt denies any GERD or reflux.   Pt also states that she has been approved for CABG.   Pt works as a Chartered certified accountant in the ED.   Past Medical History  Diagnosis Date  . Alopecia   . Autoimmune disease ([place])   . Bacterial vaginosis   . Migraine headache   . Frequent UTI   . Cyst of spleen   . Depression   . Hypertrophy of breast   . Migraine   . Depression    Current Outpatient Prescriptions on File Prior to Visit  Medication Sig Dispense Refill  . acetaminophen (TYLENOL) 500 MG tablet Take 2 tablets (1,000 mg total) by mouth every 6 (six) hours as needed. 30 tablet 0  . ALPRAZolam (XANAX) 0.5 MG tablet Take 1 tablet (0.5 mg total) by mouth 2 (two) times daily as  needed for anxiety. Can take 1-2 30 minutes before MRI. Do not drive. 15 tablet 0  . buPROPion (WELLBUTRIN XL) 150 MG 24 hr tablet Take 1 tablet (150 mg total) by mouth daily. 90 tablet 1  . ibuprofen (ADVIL,MOTRIN) 200 MG tablet Take 800 mg by mouth every 6 (six) hours as needed for moderate pain.    Marland Kitchen ibuprofen (ADVIL,MOTRIN) 600 MG tablet Take 1 tablet (600 mg total) by mouth every 6 (six) hours as needed. 30 tablet 0  . Multiple Vitamins-Minerals (ZINC PO) Take 1 tablet by mouth daily.     . SUMAtriptan (IMITREX) 100 MG tablet Take 1 tablet (100 mg total) by mouth once as needed for migraine. May repeat in 2 hours if headache persists or recurs. 10 tablet 12  . topiramate (TOPAMAX) 100 MG tablet Take 1.5 tablets (150 mg total) by mouth 2 (two) times daily. (Patient taking differently: Take 100 mg by mouth 2 (two) times daily. ) 90 tablet 11   No current facility-administered medications on file prior to visit.   Allergies  Allergen Reactions  . Sulfa Antibiotics Anaphylaxis     Review of Systems  Constitutional: Negative for fever and chills.  HENT: Negative for congestion and ear pain.   Respiratory: Positive for cough and wheezing. Negative for shortness of breath.  Cardiovascular: Negative for chest pain.  Gastrointestinal: Negative for abdominal pain.  Neurological: Positive for headaches (chronic).       Objective:   Physical Exam  Constitutional: She is oriented to person, place, and time. She appears well-developed and well-nourished. No distress.  HENT:  Head: Normocephalic and atraumatic.  Right Ear: Tympanic membrane is injected and retracted.  Left Ear: Tympanic membrane is injected and retracted.  Mouth/Throat: Posterior oropharyngeal erythema present.  Eyes: Conjunctivae are normal. Pupils are equal, round, and reactive to light.  Neck: Neck supple.  Cardiovascular: Normal rate, regular rhythm and normal heart sounds.   No murmur heard. Pulmonary/Chest: Effort  normal and breath sounds normal. No respiratory distress. She has no wheezes. She has no rales.  Musculoskeletal: Normal range of motion.  Lymphadenopathy:    Cervical adenopathy: anterior, bilat.  Neurological: She is alert and oriented to person, place, and time.  Skin: Skin is warm and dry.  Psychiatric: She has a normal mood and affect. Her behavior is normal.  Nursing note and vitals reviewed.  Filed Vitals:   06/19/15 1240  BP: 140/98  Pulse: 82  Temp: 98.4 F (36.9 C)  Resp: 17   Results for orders placed or performed in visit on 06/19/15  POCT CBC  Result Value Ref Range   WBC 8.9 4.6 - 10.2 K/uL   Lymph, poc 2.0 0.6 - 3.4   POC LYMPH PERCENT 22.0 10 - 50 %L   MID (cbc) 0.5 0 - 0.9   POC MID % 6.1 0 - 12 %M   POC Granulocyte 6.4 2 - 6.9   Granulocyte percent 71.9 37 - 80 %G   RBC 4.20 4.04 - 5.48 M/uL   Hemoglobin 13.0 12.2 - 16.2 g/dL   HCT, POC 37.6 (A) 37.7 - 47.9 %   MCV 89.6 80 - 97 fL   MCH, POC 31.1 27 - 31.2 pg   MCHC 34.7 31.8 - 35.4 g/dL   RDW, POC 14.1 %   Platelet Count, POC 372 142 - 424 K/uL   MPV 7.0 0 - 99.8 fL     Prior Chest xray (June 05, 2015) shows mild bronchitic changes     Assessment & Plan:   1. Acute bronchitis, unspecified organism     Orders Placed This Encounter  Procedures  . POCT CBC    Meds ordered this encounter  Medications  . azithromycin (ZITHROMAX) 250 MG tablet    Sig: Take 2 tabs PO x 1 dose, then 1 tab PO QD x 4 days    Dispense:  6 tablet    Refill:  0  . benzonatate (TESSALON) 200 MG capsule    Sig: Take 1 capsule (200 mg total) by mouth 3 (three) times daily as needed for cough.    Dispense:  30 capsule    Refill:  0  . chlorpheniramine-HYDROcodone (TUSSIONEX PENNKINETIC ER) 10-8 MG/5ML SUER    Sig: Take 5 mLs by mouth at bedtime as needed.    Dispense:  90 mL    Refill:  0  . fluticasone (FLONASE) 50 MCG/ACT nasal spray    Sig: Place 2 sprays into both nostrils at bedtime.    Dispense:  16 g     Refill:  2    I personally performed the services described in this documentation, which was scribed in my presence. The recorded information has been reviewed and considered, and addended by me as needed.  [name], MD MPH

## 2015-06-19 NOTE — Patient Instructions (Addendum)
   IF you received an x-ray today, you will receive an invoice from [facility]. Please contact [facility] at [phone] with questions or concerns regarding your invoice.   IF you received labwork today, you will receive an invoice from [facility]. Please contact [facility] at [phone] with questions or concerns regarding your invoice.   Our billing staff will not be able to assist you with questions regarding bills from these companies.  You will be contacted with the lab results as soon as they are available. The fastest way to get your results is to activate your My Chart account. Instructions are located on the last page of this paperwork. If you have not heard from us regarding the results in 2 weeks, please contact this office.    Acute Bronchitis Bronchitis is inflammation of the airways that extend from the windpipe into the lungs (bronchi). The inflammation often causes mucus to develop. This leads to a cough, which is the most common symptom of bronchitis.  In acute bronchitis, the condition usually develops suddenly and goes away over time, usually in a couple weeks. Smoking, allergies, and asthma can make bronchitis worse. Repeated episodes of bronchitis may cause further lung problems.  CAUSES Acute bronchitis is most often caused by the same virus that causes a cold. The virus can spread from person to person (contagious) through coughing, sneezing, and touching contaminated objects. SIGNS AND SYMPTOMS   Cough.   Fever.   Coughing up mucus.   Body aches.   Chest congestion.   Chills.   Shortness of breath.   Sore throat.  DIAGNOSIS  Acute bronchitis is usually diagnosed through a physical exam. Your health care provider will also ask you questions about your medical history. Tests, such as chest X-rays, are sometimes done to rule out other conditions.  TREATMENT  Acute bronchitis usually goes away in a  couple weeks. Oftentimes, no medical treatment is necessary. Medicines are sometimes given for relief of fever or cough. Antibiotic medicines are usually not needed but may be prescribed in certain situations. In some cases, an inhaler may be recommended to help reduce shortness of breath and control the cough. A cool mist vaporizer may also be used to help thin bronchial secretions and make it easier to clear the chest.  HOME CARE INSTRUCTIONS  Get plenty of rest.   Drink enough fluids to keep your urine clear or pale yellow (unless you have a medical condition that requires fluid restriction). Increasing fluids may help thin your respiratory secretions (sputum) and reduce chest congestion, and it will prevent dehydration.   Take medicines only as directed by your health care provider.  If you were prescribed an antibiotic medicine, finish it all even if you start to feel better.  Avoid smoking and secondhand smoke. Exposure to cigarette smoke or irritating chemicals will make bronchitis worse. If you are a smoker, consider using nicotine gum or skin patches to help control withdrawal symptoms. Quitting smoking will help your lungs heal faster.   Reduce the chances of another bout of acute bronchitis by washing your hands frequently, avoiding people with cold symptoms, and trying not to touch your hands to your mouth, nose, or eyes.   Keep all follow-up visits as directed by your health care provider.  SEEK MEDICAL CARE IF: Your symptoms do not improve after 1 week of treatment.  SEEK IMMEDIATE MEDICAL CARE IF:  You develop an increased fever or chills.   You have chest pain.     You have severe shortness of breath.  You have bloody sputum.   You develop dehydration.  You faint or repeatedly feel like you are going to pass out.  You develop repeated vomiting.  You develop a severe headache. MAKE SURE YOU:   Understand these instructions.  Will watch your  condition.  Will get help right away if you are not doing well or get worse.   This information is not intended to replace advice given to you by your health care provider. Make sure you discuss any questions you have with your health care provider.   Document Released: 04/02/2004 Document Revised: 03/16/2014 Document Reviewed: 08/16/2012 Elsevier Interactive Patient Education 2016 Elsevier Inc.  

## 2015-06-20 ENCOUNTER — Telehealth: Payer: Self-pay | Admitting: Neurology

## 2015-06-20 NOTE — Telephone Encounter (Signed)
[place] [phone] called regarding changing directions on Rx topiramate (TOPAMAX) 100 MG tablet.

## 2015-06-20 NOTE — Telephone Encounter (Signed)
Dr Jaynee Eagles- FYI Called Daphne back from [place]. Spoke to [place]. Daphne was at lunch and not available to speak. He looked at rx and it stated that it was flagged because "tablet not advised to cut/crush/chew d/t bitter taste" No other reason listed. He stated rx is okay how it is. No further questions.

## 2015-06-26 ENCOUNTER — Ambulatory Visit: Payer: Self-pay | Admitting: General Surgery

## 2015-06-26 DIAGNOSIS — G932 Benign intracranial hypertension: Secondary | ICD-10-CM | POA: Diagnosis not present

## 2015-06-26 DIAGNOSIS — E559 Vitamin D deficiency, unspecified: Secondary | ICD-10-CM | POA: Diagnosis not present

## 2015-06-26 DIAGNOSIS — R7303 Prediabetes: Secondary | ICD-10-CM | POA: Diagnosis not present

## 2015-06-26 DIAGNOSIS — G4733 Obstructive sleep apnea (adult) (pediatric): Secondary | ICD-10-CM | POA: Diagnosis not present

## 2015-06-26 DIAGNOSIS — E784 Other hyperlipidemia: Secondary | ICD-10-CM | POA: Diagnosis not present

## 2015-06-26 MED FILL — PANTOPRAZOLE SOD DR 40 MG T: 40 | 30 days supply | Qty: 30 | Fill #0

## 2015-06-26 MED FILL — ONDANSETRON ODT 4 MG TABLET: 4 | 5 days supply | Qty: 20 | Fill #0

## 2015-06-27 ENCOUNTER — Encounter: Payer: Self-pay | Admitting: Dietician

## 2015-06-27 ENCOUNTER — Encounter: Payer: 59 | Attending: General Surgery | Admitting: Dietician

## 2015-06-27 DIAGNOSIS — Z713 Dietary counseling and surveillance: Secondary | ICD-10-CM | POA: Diagnosis present

## 2015-06-27 NOTE — Progress Notes (Signed)
  Pre operative bariatric surgery:  Appt start time: 0900 end time:  0930.  Nutrition Follow up:  Primary concerns today: Margaret Fletcher returns today having lost 5 pounds since her initial assessment 1 year ago. She reports feeling anxious about events leading up to pending surgery. Recently had to move unexpectedly and experienced some financial hardships. In the past year she has been careful to maintain her weight/lose weight and continue to exercise. Has tried to be mindful of having more nutrient-dense foods like vegetables rather than calorie-dense foods like chips. Has been having Premier protein shakes. Was diagnosed with intracranial HTN and realizes that bariatric surgery is more of a medical necessity. Was advised that rapid weight loss may greatly improve her condition.    MEDICATIONS: see list  Recent physical activity: did not assess   Estimated energy needs: 1600-1800 calories  Progress Towards Goal(s):  In progress.   Nutritional Diagnosis:  [place]-3.3 Overweight/obesity related to past poor dietary habits and physical inactivity as evidenced by patient w/ pending bariatric surgery following dietary guidelines for continued weight loss.     Intervention:  Nutrition counseling provided.  Handouts given during visit include:  Protein shakes  Pre op diet  Monitoring/Evaluation:  Patient to attend pre op class prior to bariatric surgery date.

## 2015-07-01 ENCOUNTER — Other Ambulatory Visit: Payer: Self-pay

## 2015-07-01 DIAGNOSIS — Z713 Dietary counseling and surveillance: Secondary | ICD-10-CM | POA: Diagnosis not present

## 2015-07-01 NOTE — Progress Notes (Signed)
  Pre-Operative Nutrition Class:  Appt start time: 4847   End time:  1830.  Patient was seen on 07/01/2015 for Pre-Operative Bariatric Surgery Education at [facility].   Surgery date: 07/08/2015 Surgery type: RYGB [facility] weight at [facility]: 278 lbs on 05/24/2014 Weight today: 266 lbs  TANITA  BODY COMP RESULTS  07/01/15   BMI (kg/m^2) 40.4   Fat Mass (lbs) 138.5   Fat Free Mass (lbs) 127.5   Total Body Water (lbs) 93.5   Samples given per MNT protocol. Patient educated on appropriate usage: Premier protein shake (vanilla - qty 1) Lot #: [id number] Exp: 12/2015  Bariatric Advantage Calcium Citrate chew ( chocolate peanut butter - qty 1) Lot #: [id number] Exp: 02/2016  Unjury Protein Powder (vanilla - qty 1) Lot #: [id number] Exp: 11/2015  The following the learning objectives were met by the patient during this course:  Identify Pre-Op Dietary Goals and will begin 2 weeks pre-operatively  Identify appropriate sources of fluids and proteins   State protein recommendations and appropriate sources pre and post-operatively  Identify Post-Operative Dietary Goals and will follow for 2 weeks post-operatively  Identify appropriate multivitamin and calcium sources  Describe the need for physical activity post-operatively and will follow MD recommendations  State when to call healthcare provider regarding medication questions or post-operative complications  Handouts given during class include:  Pre-Op Bariatric Surgery Diet Handout  Protein Shake Handout  Post-Op Bariatric Surgery Nutrition Handout  [facility] Program Information Flyer  Support Group Information Flyer  [facility] Bariatric Supplements Price List  Follow-Up Plan: Patient will follow-up at [facility] 2 weeks post operatively for diet advancement per MD.

## 2015-07-01 NOTE — Patient Instructions (Signed)
Margaret Fletcher  07/01/2015   Your procedure is scheduled on: 07/08/15    Report to [facility] Main  Entrance take [place]  elevators to 3rd floor to  [place] at   0800 AM.  Call this number if you have problems the morning of surgery [phone]   Remember: ONLY 1 PERSON MAY GO WITH YOU TO SHORT STAY TO GET  READY MORNING OF [place].  Do not eat food or drink liquids :After Midnight.             Follow bowel prep instructions per MD.      Take these medicines the morning of surgery with A SIP OF WATER: Xanax if needed, Wellbutrin, Topamax                                 You may not have any metal on your body including hair pins and              piercings  Do not wear jewelry, make-up, lotions, powders or perfumes, deodorant             Do not wear nail polish.  Do not shave  48 hours prior to surgery.                 Do not bring valuables to the hospital. [place].  Contacts, dentures or bridgework may not be worn into surgery.  Leave suitcase in the car. After surgery it may be brought to your room.       Special Instructions: coughing and deep breathing exercises, leg exercises               Please read over the following fact sheets you were given: _____________________________________________________________________             [facility] - Preparing for Surgery Before surgery, you can play an important role.  Because skin is not sterile, your skin needs to be as free of germs as possible.  You can reduce the number of germs on your skin by washing with CHG (chlorahexidine gluconate) soap before surgery.  CHG is an antiseptic cleaner which kills germs and bonds with the skin to continue killing germs even after washing. Please DO NOT use if you have an allergy to CHG or antibacterial soaps.  If your skin becomes reddened/irritated stop using the CHG and inform your nurse when you  arrive at Short Stay. Do not shave (including legs and underarms) for at least 48 hours prior to the first CHG shower.  You may shave your face/neck. Please follow these instructions carefully:  1.  Shower with CHG Soap the night before surgery and the  morning of Surgery.  2.  If you choose to wash your hair, wash your hair first as usual with your  normal  shampoo.  3.  After you shampoo, rinse your hair and body thoroughly to remove the  shampoo.                           4.  Use CHG as you would any other liquid soap.  You can apply chg directly  to the skin and wash  Gently with a scrungie or clean washcloth.  5.  Apply the CHG Soap to your body ONLY FROM THE NECK DOWN.   Do not use on face/ open                           Wound or open sores. Avoid contact with eyes, ears mouth and genitals (private parts).                       Wash face,  Genitals (private parts) with your normal soap.             6.  Wash thoroughly, paying special attention to the area where your surgery  will be performed.  7.  Thoroughly rinse your body with warm water from the neck down.  8.  DO NOT shower/wash with your normal soap after using and rinsing off  the CHG Soap.                9.  Pat yourself dry with a clean towel.            10.  Wear clean pajamas.            11.  Place clean sheets on your bed the night of your first shower and do not  sleep with pets. Day of Surgery : Do not apply any lotions/deodorants the morning of surgery.  Please wear clean clothes to the hospital/surgery center.  FAILURE TO FOLLOW THESE INSTRUCTIONS MAY RESULT IN THE CANCELLATION OF YOUR SURGERY PATIENT SIGNATURE_________________________________  NURSE SIGNATURE__________________________________  ________________________________________________________________________

## 2015-07-01 NOTE — Telephone Encounter (Signed)
Pt states that dr Brigitte Pulse increased her welbutrin to 300 mg a day and the pharmacy only has an rx for 150mg   And she needs a refill  Best number [phone]

## 2015-07-02 ENCOUNTER — Encounter (HOSPITAL_COMMUNITY)
Admission: RE | Admit: 2015-07-02 | Discharge: 2015-07-02 | Disposition: A | Discharge status: Home / Self Care | Payer: 59 | Source: Ambulatory Visit | Attending: General Surgery | Admitting: General Surgery

## 2015-07-02 ENCOUNTER — Encounter (HOSPITAL_COMMUNITY): Payer: Self-pay

## 2015-07-02 DIAGNOSIS — Z01812 Encounter for preprocedural laboratory examination: Secondary | ICD-10-CM | POA: Insufficient documentation

## 2015-07-02 HISTORY — DX: Anxiety disorder, unspecified: F41.9

## 2015-07-02 LAB — COMPREHENSIVE METABOLIC PANEL
ALBUMIN: 3.6 g/dL (ref 3.5–5.0)
ALT: 16 U/L (ref 14–54)
ANION GAP: 8 (ref 5–15)
AST: 18 U/L (ref 15–41)
Alkaline Phosphatase: 60 U/L (ref 38–126)
BUN: 24 mg/dL — AB (ref 6–20)
CO2: 22 mmol/L (ref 22–32)
Calcium: 8.8 mg/dL — ABNORMAL LOW (ref 8.9–10.3)
Chloride: 111 mmol/L (ref 101–111)
Creatinine, Ser: 1.41 mg/dL — ABNORMAL HIGH (ref 0.44–1.00)
GFR calc Af Amer: 51 mL/min — ABNORMAL LOW (ref >60)
GFR calc non Af Amer: 44 mL/min — ABNORMAL LOW (ref >60)
GLUCOSE: 107 mg/dL — AB (ref 65–99)
POTASSIUM: 3.7 mmol/L (ref 3.5–5.1)
SODIUM: 141 mmol/L (ref 135–145)
Total Bilirubin: 0.3 mg/dL (ref 0.3–1.2)
Total Protein: 6.9 g/dL (ref 6.5–8.1)

## 2015-07-02 LAB — CBC WITH DIFFERENTIAL/PLATELET
BASOS ABS: 0.1 10*3/uL (ref 0.0–0.1)
BASOS PCT: 1 %
EOS ABS: 0.3 10*3/uL (ref 0.0–0.7)
Eosinophils Relative: 3 %
HEMATOCRIT: 37.7 % (ref 36.0–46.0)
Hemoglobin: 12.7 g/dL (ref 12.0–15.0)
Lymphocytes Relative: 21 %
Lymphs Abs: 1.8 10*3/uL (ref 0.7–4.0)
MCH: 29.8 pg (ref 26.0–34.0)
MCHC: 33.7 g/dL (ref 30.0–36.0)
MCV: 88.5 fL (ref 78.0–100.0)
MONO ABS: 0.7 10*3/uL (ref 0.1–1.0)
Monocytes Relative: 8 %
NEUTROS ABS: 5.9 10*3/uL (ref 1.7–7.7)
Neutrophils Relative %: 67 %
PLATELETS: 298 10*3/uL (ref 150–400)
RBC: 4.26 MIL/uL (ref 3.87–5.11)
RDW: 14 % (ref 11.5–15.5)
WBC: 8.8 10*3/uL (ref 4.0–10.5)

## 2015-07-02 NOTE — Progress Notes (Signed)
CMP results done 07/02/2015 faxed via EPIC to Dr Greer Pickerel.

## 2015-07-03 ENCOUNTER — Ambulatory Visit: Payer: Self-pay | Admitting: General Surgery

## 2015-07-03 LAB — VITAMIN D 25 HYDROXY (VIT D DEFICIENCY, FRACTURES): VIT D 25 HYDROXY: 21.4 ng/mL — AB (ref 30.0–100.0)

## 2015-07-03 MED FILL — VIT D2 1.25 MG (50,000 UNIT: 1.25 MG | 56 days supply | Qty: 8 | Fill #0

## 2015-07-03 NOTE — Telephone Encounter (Signed)
I do not remember anything about this either. My apologies to pt but I am drawing a blank on this. It looks like pt is actually scheduled for gastric bypass on Monday so I would not recommend increasing her dose at a time when her food intake and the digestion and absorption of medications is about to so drastically change. In fact, after her surgery, she may get better results with switching to the IR or SR version rather than the XL to ensure it is absorbed but would recommend discuss that with her surgeon.

## 2015-07-03 NOTE — Telephone Encounter (Signed)
Dr Brigitte Pulse, I don't see anything in the last OV notes or in any other encounters about an increase. I will pend this as pt reports for your review.

## 2015-07-03 NOTE — H&P (Signed)
Margaret Fletcher 06/26/2015 9:45 AM Location: [facility] Patient #: [id number] DOB: 1970-05-25 Divorced / Language: [name] / Race: Black or African American Female   History of Present Illness Margaret Hiss M. Shyheim Tanney MD; 07/01/2015 3:40 PM) The patient is a 45 year old female who presents for a bariatric surgery evaluation. She comes in today for her preoperative evaluation. She has been scheduled and approved for laparoscopic Roux-en-Y gastric bypass surgery. It has been more than a year since her initial visit. For a few months after her initial visit last year she waffled about surgery. She ended up seeing a specialist because of her chronic migraines. She ended up losing her sense of smell and ultimately She ended up being diagnosed with idiopathic intracranial hypertension. She had an MRI of her brain that confirmed the diagnosis. Her neurologist has recommended weight loss as the best treatment for her idiopathic intracranial hypertension. She is not a candidate for the medication because she is allergic to it. She also underwent bilateral breast reduction surgery. She is still taking Topamax for her migraines. Her bariatric surgery evaluation showed a normal upper GI. She had mild obstructive sleep apnea. A lipid panel from March 16 showed a total cholesterol level of 209, HDL level of 39, LDL level of 155. Hemoglobin A1c level of 6.1. Her vitamin D level in February was 20. Otherwise she denies any chest pain, chest pressure, shortness of breath, dyspnea on exertion, orthopnea, paroxysmal nocturnal dyspnea, TIAs, amaurosis fugax, abdominal pain, melena, hematochezia, dysuria, hematuria.  I reviewed her MRI brain reports as well as her upper GI, chest x-ray reports. Her baseline creatinine is around 1.2 otherwise her comprehensive metabolic panel was within normal limits   05/02/14 She is referred by Dr Myriam Jacobson for evaluation of weight loss surgery. She is specifically  interested in laparoscopic Roux-en-Y gastric bypass. It appeals to her because it has the longest track record as well as the most data. She attended our seminar in person. She has had a problem with her weight since adulthood especially after her pregnancies. She has tried many different options over the years including Weight Watchers, Nutrisystem, juice diets, as well as phentermine all without any long-term success.  Her comorbidities include depression, vitamin D deficiency, snoring, gastroesophageal reflux  She denies any chest pain, short of breath, dyspnea on exertion, orthopnea, paroxysmal nocturnal dyspnea. She does have some edema. She does have some occasional reflux and takes Tagamet as needed. She has a daily bowel movement however she does alternate between loose stools and constipation. She has had a laparoscopic cholecystectomy. She states she has had an upper endoscopy by Dr. Oletta Lamas. She underwent an upper endoscopy in February 2015 which showed a medium-sized hiatal hernia. There is no evidence of Barrett esophagus or esophagitis or stricture. The stomach was normal. She had a normal endoscopy by him in 2013. She is also had several CTs and MRIs of her abdomen. She has a stable splenic cyst and liver hemangioma. The splenic cyst measures 2.9 cm. The liver hemangioma measures 1.9. She has had a gastric emptying study in 2015 which was normal.  She has had a C-section as well as ablation.   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 Wellbutrin (75MG  Tablet, Oral) Active. Calcium + D (150-261-330MG -MG-IU Capsule, Oral) Active. Vitamin B12 (100MCG Tablet, Oral) Active. Magnesium Carbonate (250MG  Capsule, Oral) Active. Multiple Vitamin (Oral) Active. Vitamin B6 (200MG  Tablet, Oral) Active. Imitrex (25MG  Tablet, Oral) Active. Topiramate (100MG  Tablet, Oral) Active. Zofran ODT (4MG  Tablet Disperse, 1 (one) Tablet Disperse Oral every six hours, as needed, Taken starting 06/26/2015) Active. Protonix (40MG  Tablet DR, 1 (one) Tablet Oral daily, Taken starting 06/26/2015) Active. OxyCODONE HCl (5MG /5ML Solution, 5-10 Milliliter Oral every four hours, as needed, Taken starting 06/26/2015) Active.  Social History [name], MD; 07/01/2015 3:39 PM) Tobacco use Never smoker. No drug use Caffeine use Carbonated beverages, Coffee, Tea. Alcohol use Occasional alcohol use.  Family History [name], MD; 07/01/2015 3:39 PM) Cerebrovascular Accident Father. Breast Cancer Mother. Colon Polyps Father. Heart disease in female family member before age 80 Diabetes Mellitus Brother. Seizure disorder Son. Heart disease in female family member before age 49  Pregnancy / Birth History [name], MD; 07/01/2015 3:39 PM) Para 2 Maternal age 68-25 Irregular periods Gravida 3 Age at menarche 14 years.  Vitals ([name] [name]; 06/26/2015 9:42 AM) 06/26/2015 9:41 AM Weight: 273.4 lb  Height: 68in Body Surface Area: 2.33 m Body Mass Index: 41.57 kg/m  Temp.: 24F(Temporal)  Pulse: 81 (Regular)  BP: 130/76 (Sitting, Left Arm, Standard)       Physical Exam Margaret Hiss M. Marifer Hurd MD; 07/01/2015 3:34 PM) General Mental Status-Alert. General Appearance-Consistent with stated age. Hydration-Well hydrated. Voice-Normal. Note: MORBIDLY OBESE   Integumentary Note: Trace lower extremity edema; alopecia   Head and Neck Head-normocephalic, atraumatic with no lesions or palpable masses. Trachea-midline. Thyroid Gland Characteristics - normal size and consistency.  Eye Eyeball - Bilateral-Extraocular movements intact. Sclera/Conjunctiva - Bilateral-No scleral icterus.  Chest and Lung Exam Chest and lung exam reveals -quiet, even and easy respiratory effort with no use of accessory muscles and on auscultation, normal breath sounds, no adventitious sounds and normal vocal resonance. Inspection Chest Wall - Normal. Back - normal.  Breast - Did not examine.  Cardiovascular Cardiovascular examination reveals -normal heart sounds, regular rate and rhythm with no murmurs and normal pedal pulses bilaterally.  Abdomen Inspection Inspection of the abdomen reveals - No Hernias. Skin - Scar - no surgical scars. Palpation/Percussion Palpation and Percussion of the abdomen reveal - Soft, Non Tender, No Rebound tenderness, No Rigidity (guarding) and No hepatosplenomegaly. Auscultation Auscultation of the abdomen reveals - Bowel sounds normal.  Peripheral Vascular Upper Extremity Palpation - Pulses bilaterally normal.  Neurologic Neurologic evaluation reveals -alert and oriented x 3 with no impairment of recent or remote memory. Mental Status-Normal.  Neuropsychiatric The patient's mood and affect are described as -normal. Judgment and Insight-insight is appropriate concerning matters relevant to self.  Musculoskeletal Normal  Exam - Left-Upper Extremity Strength Normal and Lower Extremity Strength Normal. Normal Exam - Right-Upper Extremity Strength Normal and Lower Extremity Strength Normal.  Lymphatic Head & Neck  General Head & Neck Lymphatics: Bilateral - Description - Normal. Axillary - Did not examine.  Femoral & Inguinal - Did not examine.    Assessment & Plan Margaret Hiss M. Dennisse Swader MD; 07/01/2015 3:39 PM) OBESITY, MORBID, BMI 40.0-49.9 (E66.01) Impression: The patient still meets weight loss surgery criteria. I think the patient would be an acceptable candidate for Laparoscopic Roux-en-Y Gastric bypass. We briefly discussed sleeve gastrectomy as she asked about it. After discussion she elected to stay with the laparoscopic Roux-en-Y gastric bypass procedure. We rediscussed the steps of surgery. We also discussed the typical postoperative course. We reviewed her preoperative workup. We also discussed the typical risk and potential complications after surgery. All of her questions were asked and answered. She was given her postoperative prescriptions for pain, nausea, and heartburn. She was also given her preoperative bowel prep instructions.. I encouraged her to contact me should she have any questions between now and surgery. I spent approximately one hour with her. Current Plans Pt Education - EMW_preopbariatric Started Zofran ODT 4MG , 1 (one) Tablet Disperse every six hours, as needed, #20, 06/26/2015, No Refill. Started Protonix 40MG , 1 (one) Tablet daily, #30, 30 days starting 06/26/2015, Ref. x1. Started OxyCODONE HCl 5MG /5ML, 5-10 Milliliter every four hours, as needed, 200 Milliliter, 06/26/2015, No Refill. 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 Redmond Pulling, MD, FACS General, Bariatric, & Minimally Invasive Surgery [facility], [place]

## 2015-07-04 ENCOUNTER — Ambulatory Visit (INDEPENDENT_AMBULATORY_CARE_PROVIDER_SITE_OTHER): Payer: 59 | Admitting: Family Medicine

## 2015-07-04 VITALS — BP 126/88 | HR 73 | Temp 98.6°F | Resp 16 | Ht 67.0 in | Wt 265.6 lb

## 2015-07-04 DIAGNOSIS — G932 Benign intracranial hypertension: Secondary | ICD-10-CM | POA: Diagnosis not present

## 2015-07-04 DIAGNOSIS — R059 Cough, unspecified: Secondary | ICD-10-CM

## 2015-07-04 DIAGNOSIS — R519 Headache, unspecified: Secondary | ICD-10-CM

## 2015-07-04 DIAGNOSIS — R05 Cough: Secondary | ICD-10-CM

## 2015-07-04 DIAGNOSIS — F4323 Adjustment disorder with mixed anxiety and depressed mood: Secondary | ICD-10-CM | POA: Diagnosis not present

## 2015-07-04 DIAGNOSIS — R030 Elevated blood-pressure reading, without diagnosis of hypertension: Secondary | ICD-10-CM

## 2015-07-04 DIAGNOSIS — R51 Headache: Secondary | ICD-10-CM

## 2015-07-04 DIAGNOSIS — F329 Major depressive disorder, single episode, unspecified: Secondary | ICD-10-CM | POA: Diagnosis not present

## 2015-07-04 DIAGNOSIS — G8929 Other chronic pain: Secondary | ICD-10-CM

## 2015-07-04 DIAGNOSIS — F32A Depression, unspecified: Secondary | ICD-10-CM

## 2015-07-04 MED ORDER — ALPRAZOLAM 0.5 MG PO TABS: 0.5000 mg | ORAL_TABLET | Freq: Two times a day (BID) | ORAL | Status: DC | PRN

## 2015-07-04 MED ORDER — BUPROPION HCL ER (XL) 300 MG PO TB24: 300.0000 mg | ORAL_TABLET | Freq: Every day | ORAL | Status: DC

## 2015-07-04 MED ORDER — ALBUTEROL SULFATE 108 (90 BASE) MCG/ACT IN AEPB: 2.0000 | INHALATION_SPRAY | Freq: Four times a day (QID) | RESPIRATORY_TRACT | Status: DC | PRN

## 2015-07-04 MED FILL — oxyCODONE HCL 5 MG/5ML SOLN: 5 | 3 days supply | Qty: 200 | Fill #0

## 2015-07-04 MED FILL — BUPROPION HCL XL 300 MG TAB: 300 | 90 days supply | Qty: 90 | Fill #0

## 2015-07-04 MED FILL — PROAIR RESPICLICK INHAL PWD: 108 (90 BAS | 25 days supply | Qty: 1 | Fill #0

## 2015-07-04 MED FILL — ALPRAZolam 0.5 MG TABS: 0.5 | 14 days supply | Qty: 30 | Fill #0

## 2015-07-04 NOTE — Patient Instructions (Addendum)
I believe you should wait on any treatment for the blood pressure because the surgery will probably result in a fall in blood pressure.

## 2015-07-04 NOTE — Progress Notes (Signed)
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  1. Bariatric surgery anticipated.  Surgeon recommended review of medications and problem pre-op.  2. Needs Wellbutrin XL 300 per day refilled along with Xanax 0.5  3. Chronic headaches, recently diagnoses.  Has been on Imitrex prn and Topamax 150 bid.  The topamax affects the memory and energy  Level.  It seems to make her somewhat "dyslexic"  She sees Dr.  Sarina Ill at [facility]  4. Persistent cough for several weeks.  She is an avid exerciser.  PMHx:  Surgery-->  C/section, appendectomy, breast reduction, cholecystectomy and uterine ablation Current Outpatient Prescriptions on File Prior to Visit  Medication Sig Dispense Refill  . Multiple Vitamin (MULTIVITAMIN WITH MINERALS) TABS tablet Take 1 tablet by mouth daily.    . SUMAtriptan (IMITREX) 100 MG tablet Take 1 tablet (100 mg total) by mouth once as needed for migraine. May repeat in 2 hours if headache persists or recurs. 10 tablet 12  . topiramate (TOPAMAX) 100 MG tablet Take 1.5 tablets (150 mg total) by mouth 2 (two) times daily. 90 tablet 11  . ibuprofen (ADVIL,MOTRIN) 600 MG tablet Take 1 tablet (600 mg total) by mouth every 6 (six) hours as needed. (Patient not taking: Reported on 06/27/2015) 30 tablet 0   No current facility-administered medications on file prior to visit.   Objective:  BP 126/88 mmHg  Pulse 73  Temp(Src) 98.6 F (37 C) (Oral)  Resp 16  Ht 5\' 7"  (1.702 m)  Wt 265 lb 9.6 oz (120.475 kg)  BMI 41.59 kg/m2  SpO2 99% HEENT: unremarkable, flat optic discs Neck:  No thyromegaly or adenopath Chest: clear Heart: reg, no murmur Abdomen:  Soft, nontender with no HSM or mass palpated. Ext:  Trace edema  Results for orders placed or performed during the hospital encounter of 07/02/15  CBC WITH DIFFERENTIAL  Result Value Ref Range   WBC 8.8 4.0 - 10.5 K/uL   RBC 4.26 3.87 - 5.11 MIL/uL   Hemoglobin 12.7 12.0 - 15.0 g/dL   HCT 37.7 36.0 - 46.0 %   MCV 88.5 78.0  - 100.0 fL   MCH 29.8 26.0 - 34.0 pg   MCHC 33.7 30.0 - 36.0 g/dL   RDW 14.0 11.5 - 15.5 %   Platelets 298 150 - 400 K/uL   Neutrophils Relative % 67 %   Neutro Abs 5.9 1.7 - 7.7 K/uL   Lymphocytes Relative 21 %   Lymphs Abs 1.8 0.7 - 4.0 K/uL   Monocytes Relative 8 %   Monocytes Absolute 0.7 0.1 - 1.0 K/uL   Eosinophils Relative 3 %   Eosinophils Absolute 0.3 0.0 - 0.7 K/uL   Basophils Relative 1 %   Basophils Absolute 0.1 0.0 - 0.1 K/uL  Comprehensive metabolic panel  Result Value Ref Range   Sodium 141 135 - 145 mmol/L   Potassium 3.7 3.5 - 5.1 mmol/L   Chloride 111 101 - 111 mmol/L   CO2 22 22 - 32 mmol/L   Glucose, Bld 107 (H) 65 - 99 mg/dL   BUN 24 (H) 6 - 20 mg/dL   Creatinine, Ser 1.41 (H) 0.44 - 1.00 mg/dL   Calcium 8.8 (L) 8.9 - 10.3 mg/dL   Total Protein 6.9 6.5 - 8.1 g/dL   Albumin 3.6 3.5 - 5.0 g/dL   AST 18 15 - 41 U/L   ALT 16 14 - 54 U/L   Alkaline Phosphatase 60 38 - 126 U/L   Total Bilirubin 0.3 0.3 -  1.2 mg/dL   GFR calc non Af Amer 44 (L) >60 mL/min   GFR calc Af Amer 51 (L) >60 mL/min   Anion gap 8 5 - 15  VITAMIN D 25 Hydroxy (Vit-D Deficiency, Fractures)  Result Value Ref Range   Vit D, 25-Hydroxy 21.4 (L) 30.0 - 100.0 ng/mL     Assessment:  No change in medication needed at this point.  I don't believe antihypertensive medication is indicated.  Adjustment disorder with mixed anxiety and depressed mood - Plan: ALPRAZolam (XANAX) 0.5 MG tablet, buPROPion (WELLBUTRIN XL) 300 MG 24 hr tablet  Intracranial hypertension  Borderline hypertension  Chronic nonintractable headache, unspecified headache type  Depression  Cough - Plan: Albuterol Sulfate (PROAIR RESPICLICK) [id number] (90 Base) MCG/ACT AEPB  Robyn Haber, MD

## 2015-07-08 ENCOUNTER — Inpatient Hospital Stay (HOSPITAL_COMMUNITY)
Admission: RE | Admit: 2015-07-08 | Discharge: 2015-07-10 | DRG: 621 | Disposition: A | Discharge status: Home / Self Care | Payer: 59 | Source: Ambulatory Visit | Attending: General Surgery | Admitting: General Surgery

## 2015-07-08 ENCOUNTER — Inpatient Hospital Stay (HOSPITAL_COMMUNITY): Payer: 59 | Admitting: Anesthesiology

## 2015-07-08 ENCOUNTER — Encounter (HOSPITAL_COMMUNITY)
Admission: RE | Disposition: A | Discharge status: Home / Self Care | Payer: Self-pay | Source: Ambulatory Visit | Attending: General Surgery

## 2015-07-08 ENCOUNTER — Encounter (HOSPITAL_COMMUNITY): Payer: Self-pay | Admitting: *Deleted

## 2015-07-08 DIAGNOSIS — Z9049 Acquired absence of other specified parts of digestive tract: Secondary | ICD-10-CM

## 2015-07-08 DIAGNOSIS — R110 Nausea: Secondary | ICD-10-CM

## 2015-07-08 DIAGNOSIS — F419 Anxiety disorder, unspecified: Secondary | ICD-10-CM | POA: Diagnosis present

## 2015-07-08 DIAGNOSIS — K7689 Other specified diseases of liver: Secondary | ICD-10-CM | POA: Diagnosis not present

## 2015-07-08 DIAGNOSIS — Z833 Family history of diabetes mellitus: Secondary | ICD-10-CM | POA: Diagnosis not present

## 2015-07-08 DIAGNOSIS — Z823 Family history of stroke: Secondary | ICD-10-CM | POA: Diagnosis not present

## 2015-07-08 DIAGNOSIS — E559 Vitamin D deficiency, unspecified: Secondary | ICD-10-CM | POA: Diagnosis present

## 2015-07-08 DIAGNOSIS — G4733 Obstructive sleep apnea (adult) (pediatric): Secondary | ICD-10-CM | POA: Diagnosis present

## 2015-07-08 DIAGNOSIS — D734 Cyst of spleen: Secondary | ICD-10-CM | POA: Diagnosis present

## 2015-07-08 DIAGNOSIS — R7303 Prediabetes: Secondary | ICD-10-CM | POA: Diagnosis present

## 2015-07-08 DIAGNOSIS — K219 Gastro-esophageal reflux disease without esophagitis: Secondary | ICD-10-CM | POA: Diagnosis not present

## 2015-07-08 DIAGNOSIS — K449 Diaphragmatic hernia without obstruction or gangrene: Secondary | ICD-10-CM | POA: Diagnosis present

## 2015-07-08 DIAGNOSIS — E785 Hyperlipidemia, unspecified: Secondary | ICD-10-CM | POA: Diagnosis present

## 2015-07-08 DIAGNOSIS — G43019 Migraine without aura, intractable, without status migrainosus: Secondary | ICD-10-CM | POA: Diagnosis present

## 2015-07-08 DIAGNOSIS — G43909 Migraine, unspecified, not intractable, without status migrainosus: Secondary | ICD-10-CM | POA: Diagnosis present

## 2015-07-08 DIAGNOSIS — Z6841 Body Mass Index (BMI) 40.0 and over, adult: Secondary | ICD-10-CM | POA: Diagnosis not present

## 2015-07-08 DIAGNOSIS — Z9884 Bariatric surgery status: Secondary | ICD-10-CM | POA: Diagnosis not present

## 2015-07-08 DIAGNOSIS — G932 Benign intracranial hypertension: Secondary | ICD-10-CM | POA: Diagnosis present

## 2015-07-08 DIAGNOSIS — F329 Major depressive disorder, single episode, unspecified: Secondary | ICD-10-CM | POA: Diagnosis present

## 2015-07-08 DIAGNOSIS — G43709 Chronic migraine without aura, not intractable, without status migrainosus: Secondary | ICD-10-CM | POA: Diagnosis present

## 2015-07-08 HISTORY — PX: LAPAROSCOPIC ROUX-EN-Y GASTRIC BYPASS WITH HIATAL HERNIA REPAIR: SHX6513

## 2015-07-08 HISTORY — DX: Benign intracranial hypertension: G93.2

## 2015-07-08 LAB — HEMOGLOBIN AND HEMATOCRIT, BLOOD
HEMATOCRIT: 39.5 % (ref 36.0–46.0)
HEMOGLOBIN: 13 g/dL (ref 12.0–15.0)

## 2015-07-08 LAB — PREGNANCY, URINE: PREG TEST UR: NEGATIVE

## 2015-07-08 SURGERY — CREATION, GASTRIC BYPASS, LAPAROSCOPIC, USING ROUX-EN-Y GASTROENTEROSTOMY, WITH HIATAL HERNIA REPAIR
Anesthesia: General | Site: Abdomen

## 2015-07-08 MED ORDER — SUGAMMADEX SODIUM 500 MG/5ML IV SOLN
INTRAVENOUS | Status: AC
  Filled 2015-07-08: qty 5

## 2015-07-08 MED ORDER — MORPHINE SULFATE (PF) 10 MG/ML IV SOLN
INTRAVENOUS | Status: AC
  Administered 2015-07-09: 4 mg
  Filled 2015-07-08: qty 1

## 2015-07-08 MED ORDER — ACETAMINOPHEN 160 MG/5ML PO SOLN: 325.0000 mg | ORAL | Status: DC | PRN

## 2015-07-08 MED ORDER — EVICEL 5 ML EX KIT
PACK | Freq: Once | CUTANEOUS | Status: DC
  Filled 2015-07-08: qty 1

## 2015-07-08 MED ORDER — DEXAMETHASONE SODIUM PHOSPHATE 10 MG/ML IJ SOLN
INTRAMUSCULAR | Status: DC | PRN
  Administered 2015-07-08: 10 mg via INTRAVENOUS

## 2015-07-08 MED ORDER — ONDANSETRON HCL 4 MG/2ML IJ SOLN
INTRAMUSCULAR | Status: DC | PRN
  Administered 2015-07-08: 4 mg via INTRAVENOUS

## 2015-07-08 MED ORDER — HYDROMORPHONE HCL 1 MG/ML IJ SOLN
INTRAMUSCULAR | Status: AC
  Filled 2015-07-08: qty 1

## 2015-07-08 MED ORDER — ACETAMINOPHEN 10 MG/ML IV SOLN
1000.0000 mg | Freq: Once | INTRAVENOUS | Status: AC
  Administered 2015-07-08: 1000 mg via INTRAVENOUS

## 2015-07-08 MED ORDER — LIDOCAINE HCL (CARDIAC) 20 MG/ML IV SOLN
INTRAVENOUS | Status: DC | PRN
  Administered 2015-07-08: 100 mg via INTRAVENOUS

## 2015-07-08 MED ORDER — DIPHENHYDRAMINE HCL 50 MG/ML IJ SOLN: 12.5000 mg | Freq: Three times a day (TID) | INTRAMUSCULAR | Status: DC | PRN

## 2015-07-08 MED ORDER — ONDANSETRON HCL 4 MG/2ML IJ SOLN
INTRAMUSCULAR | Status: AC
  Filled 2015-07-08: qty 2

## 2015-07-08 MED ORDER — PROPOFOL 10 MG/ML IV BOLUS
INTRAVENOUS | Status: DC | PRN
  Administered 2015-07-08: 230 mg via INTRAVENOUS

## 2015-07-08 MED ORDER — PROPOFOL 10 MG/ML IV BOLUS
INTRAVENOUS | Status: AC
  Filled 2015-07-08: qty 20

## 2015-07-08 MED ORDER — MIDAZOLAM HCL 2 MG/2ML IJ SOLN
INTRAMUSCULAR | Status: AC
  Filled 2015-07-08: qty 2

## 2015-07-08 MED ORDER — FENTANYL CITRATE (PF) 100 MCG/2ML IJ SOLN
INTRAMUSCULAR | Status: DC | PRN
  Administered 2015-07-08 (×9): 50 ug via INTRAVENOUS

## 2015-07-08 MED ORDER — ROCURONIUM BROMIDE 50 MG/5ML IV SOLN
INTRAVENOUS | Status: AC
  Filled 2015-07-08: qty 2

## 2015-07-08 MED ORDER — PREMIER PROTEIN SHAKE
2.0000 [oz_av] | Freq: Four times a day (QID) | ORAL | Status: DC
  Administered 2015-07-10 (×4): 2 [oz_av] via ORAL

## 2015-07-08 MED ORDER — LACTATED RINGERS IV SOLN
INTRAVENOUS | Status: DC
  Administered 2015-07-08 (×2): via INTRAVENOUS
  Administered 2015-07-08: 1000 mL via INTRAVENOUS

## 2015-07-08 MED ORDER — ROCURONIUM BROMIDE 50 MG/5ML IV SOLN
INTRAVENOUS | Status: AC
  Filled 2015-07-08: qty 1

## 2015-07-08 MED ORDER — KCL IN DEXTROSE-NACL 20-5-0.45 MEQ/L-%-% IV SOLN
INTRAVENOUS | Status: DC
  Administered 2015-07-08: 17:00:00 via INTRAVENOUS
  Administered 2015-07-09 (×2): 125 mL/h via INTRAVENOUS
  Administered 2015-07-09 – 2015-07-10 (×2): via INTRAVENOUS
  Filled 2015-07-08 (×7): qty 1000

## 2015-07-08 MED ORDER — CHLORHEXIDINE GLUCONATE 4 % EX LIQD: 60.0000 mL | Freq: Once | CUTANEOUS | Status: DC

## 2015-07-08 MED ORDER — OXYCODONE HCL 5 MG/5ML PO SOLN
5.0000 mg | ORAL | Status: DC | PRN
  Administered 2015-07-09 (×2): 5 mg via ORAL
  Filled 2015-07-08 (×2): qty 25

## 2015-07-08 MED ORDER — FENTANYL CITRATE (PF) 100 MCG/2ML IJ SOLN
INTRAMUSCULAR | Status: AC
  Filled 2015-07-08: qty 2

## 2015-07-08 MED ORDER — BUPIVACAINE LIPOSOME 1.3 % IJ SUSP
Freq: Once | INTRAMUSCULAR | Status: AC
  Administered 2015-07-08: 70 mL
  Filled 2015-07-08 (×2): qty 20

## 2015-07-08 MED ORDER — PANTOPRAZOLE SODIUM 40 MG IV SOLR
40.0000 mg | Freq: Every day | INTRAVENOUS | Status: DC
  Administered 2015-07-08 – 2015-07-09 (×2): 40 mg via INTRAVENOUS
  Filled 2015-07-08 (×3): qty 40

## 2015-07-08 MED ORDER — HEPARIN SODIUM (PORCINE) 5000 UNIT/ML IJ SOLN
5000.0000 [IU] | INTRAMUSCULAR | Status: AC
  Administered 2015-07-08: 5000 [IU] via SUBCUTANEOUS
  Filled 2015-07-08: qty 1

## 2015-07-08 MED ORDER — SODIUM CHLORIDE 0.9 % IJ SOLN
INTRAMUSCULAR | Status: DC | PRN
  Administered 2015-07-08: 50 mL via INTRAVENOUS

## 2015-07-08 MED ORDER — ACETAMINOPHEN 10 MG/ML IV SOLN
1000.0000 mg | Freq: Four times a day (QID) | INTRAVENOUS | Status: AC
Start: 2015-07-08 — End: 2015-07-09
  Administered 2015-07-08 – 2015-07-09 (×3): 1000 mg via INTRAVENOUS
  Filled 2015-07-08 (×4): qty 100

## 2015-07-08 MED ORDER — TOPIRAMATE 100 MG PO TABS
100.0000 mg | ORAL_TABLET | Freq: Two times a day (BID) | ORAL | Status: DC
  Filled 2015-07-08: qty 1

## 2015-07-08 MED ORDER — ACETAMINOPHEN 160 MG/5ML PO SOLN: 650.0000 mg | ORAL | Status: DC | PRN

## 2015-07-08 MED ORDER — TOPIRAMATE 25 MG PO TABS
150.0000 mg | ORAL_TABLET | Freq: Two times a day (BID) | ORAL | Status: DC
Start: 2015-07-08 — End: 2015-07-10
  Administered 2015-07-08 – 2015-07-10 (×4): 150 mg via ORAL
  Filled 2015-07-08 (×5): qty 2

## 2015-07-08 MED ORDER — LIDOCAINE HCL (CARDIAC) 20 MG/ML IV SOLN
INTRAVENOUS | Status: AC
  Filled 2015-07-08: qty 5

## 2015-07-08 MED ORDER — METOCLOPRAMIDE HCL 5 MG/ML IJ SOLN
10.0000 mg | Freq: Three times a day (TID) | INTRAMUSCULAR | Status: DC | PRN
  Administered 2015-07-08 – 2015-07-09 (×2): 10 mg via INTRAVENOUS
  Filled 2015-07-08 (×2): qty 2

## 2015-07-08 MED ORDER — ACETAMINOPHEN 10 MG/ML IV SOLN
INTRAVENOUS | Status: AC
  Filled 2015-07-08: qty 100

## 2015-07-08 MED ORDER — FENTANYL CITRATE (PF) 250 MCG/5ML IJ SOLN
INTRAMUSCULAR | Status: AC
  Filled 2015-07-08: qty 5

## 2015-07-08 MED ORDER — ROCURONIUM BROMIDE 100 MG/10ML IV SOLN
INTRAVENOUS | Status: DC | PRN
  Administered 2015-07-08: 10 mg via INTRAVENOUS
  Administered 2015-07-08: 50 mg via INTRAVENOUS
  Administered 2015-07-08: 5 mg via INTRAVENOUS
  Administered 2015-07-08 (×3): 10 mg via INTRAVENOUS

## 2015-07-08 MED ORDER — CEFOTETAN DISODIUM-DEXTROSE 2-2.08 GM-% IV SOLR
INTRAVENOUS | Status: AC
  Filled 2015-07-08: qty 50

## 2015-07-08 MED ORDER — SUCCINYLCHOLINE CHLORIDE 20 MG/ML IJ SOLN
INTRAMUSCULAR | Status: DC | PRN
  Administered 2015-07-08: 140 mg via INTRAVENOUS

## 2015-07-08 MED ORDER — TISSEEL VH 10 ML EX KIT
PACK | CUTANEOUS | Status: AC
  Filled 2015-07-08: qty 1

## 2015-07-08 MED ORDER — ONDANSETRON HCL 4 MG/2ML IJ SOLN
4.0000 mg | INTRAMUSCULAR | Status: DC | PRN
  Administered 2015-07-08 – 2015-07-09 (×3): 4 mg via INTRAVENOUS
  Filled 2015-07-08 (×3): qty 2

## 2015-07-08 MED ORDER — MIDAZOLAM HCL 5 MG/5ML IJ SOLN
INTRAMUSCULAR | Status: DC | PRN
  Administered 2015-07-08: 2 mg via INTRAVENOUS

## 2015-07-08 MED ORDER — CEFOTETAN DISODIUM-DEXTROSE 2-2.08 GM-% IV SOLR
2.0000 g | INTRAVENOUS | Status: AC
  Administered 2015-07-08: 2 g via INTRAVENOUS

## 2015-07-08 MED ORDER — SUGAMMADEX SODIUM 200 MG/2ML IV SOLN
INTRAVENOUS | Status: DC | PRN
  Administered 2015-07-08: 400 mg via INTRAVENOUS

## 2015-07-08 MED ORDER — EPHEDRINE SULFATE 50 MG/ML IJ SOLN
INTRAMUSCULAR | Status: DC | PRN
Start: 2015-07-08 — End: 2015-07-08
  Administered 2015-07-08 (×2): 5 mg via INTRAVENOUS

## 2015-07-08 MED ORDER — SODIUM CHLORIDE 0.9 % IJ SOLN
INTRAMUSCULAR | Status: AC
  Filled 2015-07-08: qty 10

## 2015-07-08 MED ORDER — HYDROMORPHONE HCL 1 MG/ML IJ SOLN
0.2500 mg | INTRAMUSCULAR | Status: DC | PRN
  Administered 2015-07-08 (×4): 0.5 mg via INTRAVENOUS

## 2015-07-08 MED ORDER — ENOXAPARIN SODIUM 30 MG/0.3ML ~~LOC~~ SOLN
30.0000 mg | Freq: Two times a day (BID) | SUBCUTANEOUS | Status: DC
  Administered 2015-07-09 – 2015-07-10 (×3): 30 mg via SUBCUTANEOUS
  Filled 2015-07-08 (×6): qty 0.3

## 2015-07-08 MED ORDER — PROCHLORPERAZINE EDISYLATE 5 MG/ML IJ SOLN: 10.0000 mg | Freq: Four times a day (QID) | INTRAMUSCULAR | Status: DC | PRN

## 2015-07-08 MED ORDER — MORPHINE SULFATE (PF) 10 MG/ML IV SOLN
2.0000 mg | INTRAVENOUS | Status: DC | PRN
  Administered 2015-07-08 – 2015-07-09 (×4): 2 mg via INTRAVENOUS
  Filled 2015-07-08 (×4): qty 1

## 2015-07-08 SURGICAL SUPPLY — 65 items
APPLICATOR COTTON TIP 6IN STRL (MISCELLANEOUS) ×4 IMPLANT
BLADE SURG SZ11 CARB STEEL (BLADE) ×2 IMPLANT
CABLE HIGH FREQUENCY MONO STRZ (ELECTRODE) IMPLANT
CHLORAPREP W/TINT 26ML (MISCELLANEOUS) ×4 IMPLANT
CLIP SUT LAPRA TY ABSORB (SUTURE) ×4 IMPLANT
COVER SURGICAL LIGHT HANDLE (MISCELLANEOUS) ×2 IMPLANT
CUTTER FLEX LINEAR 45M (STAPLE) IMPLANT
DERMABOND ADVANCED (GAUZE/BANDAGES/DRESSINGS)
DERMABOND ADVANCED .7 DNX12 (GAUZE/BANDAGES/DRESSINGS) IMPLANT
DEVICE SUT QUICK LOAD TK 5 (STAPLE) IMPLANT
DEVICE SUT TI-KNOT TK 5X26 (MISCELLANEOUS) IMPLANT
DEVICE SUTURE ENDOST 10MM (ENDOMECHANICALS) ×2 IMPLANT
DRAIN PENROSE 18X1/4 LTX STRL (WOUND CARE) ×2 IMPLANT
ELECT REM PT RETURN 9FT ADLT (ELECTROSURGICAL) ×2
ELECTRODE REM PT RTRN 9FT ADLT (ELECTROSURGICAL) ×1 IMPLANT
GAUZE SPONGE 4X4 12PLY STRL (GAUZE/BANDAGES/DRESSINGS) IMPLANT
GAUZE SPONGE 4X4 16PLY XRAY LF (GAUZE/BANDAGES/DRESSINGS) ×2 IMPLANT
GLOVE BIOGEL M STRL SZ7.5 (GLOVE) ×2 IMPLANT
GOWN STRL REUS W/TWL XL LVL3 (GOWN DISPOSABLE) ×8 IMPLANT
HOVERMATT SINGLE USE (MISCELLANEOUS) ×2 IMPLANT
KIT BASIN OR (CUSTOM PROCEDURE TRAY) ×2 IMPLANT
KIT GASTRIC LAVAGE 34FR ADT (SET/KITS/TRAYS/PACK) ×2 IMPLANT
LUBRICANT JELLY K Y 4OZ (MISCELLANEOUS) ×2 IMPLANT
MARKER SKIN DUAL TIP RULER LAB (MISCELLANEOUS) ×2 IMPLANT
NEEDLE SPNL 22GX3.5 QUINCKE BK (NEEDLE) ×2 IMPLANT
PACK CARDIOVASCULAR III (CUSTOM PROCEDURE TRAY) ×2 IMPLANT
RELOAD 45 VASCULAR/THIN (ENDOMECHANICALS) IMPLANT
RELOAD ENDO STITCH 2.0 (ENDOMECHANICALS) ×16
RELOAD STAPLE TA45 3.5 REG BLU (ENDOMECHANICALS) IMPLANT
RELOAD STAPLER BLUE 60MM (STAPLE) ×2 IMPLANT
RELOAD STAPLER GOLD 60MM (STAPLE) ×1 IMPLANT
RELOAD STAPLER WHITE 60MM (STAPLE) ×2 IMPLANT
SCISSORS LAP 5X45 EPIX DISP (ENDOMECHANICALS) ×2 IMPLANT
SEALANT SURGICAL APPL DUAL CAN (MISCELLANEOUS) ×2 IMPLANT
SET IRRIG TUBING LAPAROSCOPIC (IRRIGATION / IRRIGATOR) ×2 IMPLANT
SHEARS HARMONIC ACE PLUS 45CM (MISCELLANEOUS) ×2 IMPLANT
SLEEVE ADV FIXATION 12X100MM (TROCAR) ×4 IMPLANT
SLEEVE XCEL OPT CAN 5 100 (ENDOMECHANICALS) ×2 IMPLANT
SOLUTION ANTI FOG 6CC (MISCELLANEOUS) ×2 IMPLANT
STAPLER ECHELON BIOABSB 60 FLE (MISCELLANEOUS) IMPLANT
STAPLER ECHELON LONG 60 440 (INSTRUMENTS) ×2 IMPLANT
STAPLER RELOAD BLUE 60MM (STAPLE) ×4
STAPLER RELOAD GOLD 60MM (STAPLE) ×2
STAPLER RELOAD WHITE 60MM (STAPLE) ×4
SUT MNCRL AB 4-0 PS2 18 (SUTURE) ×2 IMPLANT
SUT RELOAD ENDO STITCH 2 48X1 (ENDOMECHANICALS) ×5
SUT RELOAD ENDO STITCH 2.0 (ENDOMECHANICALS) ×11
SUT SURGIDAC NAB ES-9 0 48 120 (SUTURE) IMPLANT
SUT VIC AB 2-0 SH 27 (SUTURE) ×1
SUT VIC AB 2-0 SH 27X BRD (SUTURE) ×1 IMPLANT
SUTURE RELOAD END STTCH 2 48X1 (ENDOMECHANICALS) ×5 IMPLANT
SUTURE RELOAD ENDO STITCH 2.0 (ENDOMECHANICALS) ×11 IMPLANT
SYR 10ML ECCENTRIC (SYRINGE) ×2 IMPLANT
SYR 20CC LL (SYRINGE) ×4 IMPLANT
TOWEL OR 17X26 10 PK STRL BLUE (TOWEL DISPOSABLE) ×2 IMPLANT
TOWEL OR NON WOVEN STRL DISP B (DISPOSABLE) ×2 IMPLANT
TRAY FOLEY W/METER SILVER 14FR (SET/KITS/TRAYS/PACK) ×2 IMPLANT
TRAY FOLEY W/METER SILVER 16FR (SET/KITS/TRAYS/PACK) IMPLANT
TROCAR ADV FIXATION 12X100MM (TROCAR) ×2 IMPLANT
TROCAR ADV FIXATION 5X100MM (TROCAR) ×2 IMPLANT
TROCAR BLADELESS OPT 5 100 (ENDOMECHANICALS) ×2 IMPLANT
TROCAR XCEL 12X100 BLDLESS (ENDOMECHANICALS) ×2 IMPLANT
TUBING CONNECTING 10 (TUBING) IMPLANT
TUBING ENDO SMARTCAP PENTAX (MISCELLANEOUS) ×2 IMPLANT
TUBING INSUF HEATED (TUBING) ×2 IMPLANT

## 2015-07-08 NOTE — Telephone Encounter (Signed)
Pt is currently in surgery so will wait to call to discuss.

## 2015-07-08 NOTE — Anesthesia Preprocedure Evaluation (Signed)
Anesthesia Evaluation  Patient identified by MRN, date of birth, ID band Patient awake    Reviewed: Allergy & Precautions, NPO status , Patient's Chart, lab work & pertinent test results  Airway Mallampati: II  TM Distance: >3 FB Neck ROM: Full    Dental   Pulmonary neg pulmonary ROS,    breath sounds clear to auscultation       Cardiovascular negative cardio ROS   Rhythm:Regular Rate:Normal     Neuro/Psych    GI/Hepatic negative GI ROS, Neg liver ROS,   Endo/Other  negative endocrine ROS  Renal/GU negative Renal ROS     Musculoskeletal   Abdominal   Peds  Hematology   Anesthesia Other Findings   Reproductive/Obstetrics                             Anesthesia Physical Anesthesia Plan  ASA: III  Anesthesia Plan: General   Post-op Pain Management:    Induction: Intravenous  Airway Management Planned: Oral ETT  Additional Equipment:   Intra-op Plan:   Post-operative Plan: Possible Post-op intubation/ventilation  Informed Consent: I have reviewed the patients History and Physical, chart, labs and discussed the procedure including the risks, benefits and alternatives for the proposed anesthesia with the patient or authorized representative who has indicated his/her understanding and acceptance.   Dental advisory given  Plan Discussed with: CRNA and Anesthesiologist  Anesthesia Plan Comments:         Anesthesia Quick Evaluation

## 2015-07-08 NOTE — Transfer of Care (Signed)
Immediate Anesthesia Transfer of Care Note  Patient: Margaret Fletcher  Procedure(s) Performed: Procedure(s): LAPAROSCOPIC ROUX-EN-Y GASTRIC BYPASS WITH HIATAL HERNIA REPAIR WITH UPPER ENDOSCOPY (N/A)  Patient Location: PACU  Anesthesia Type:General  Level of Consciousness:  sedated, patient cooperative and responds to stimulation  Airway & Oxygen Therapy:Patient Spontanous Breathing and Patient connected to face mask oxgen  Post-op Assessment:  Report given to PACU RN and Post -op Vital signs reviewed and stable  Post vital signs:  Reviewed and stable  Last Vitals:  Filed Vitals:   07/08/15 0803  BP: 118/69  Pulse: 83  Temp: 36.7 C  Resp: 16    Complications: No apparent anesthesia complications

## 2015-07-08 NOTE — Anesthesia Procedure Notes (Signed)
Procedure Name: Intubation Date/Time: 07/08/2015 9:50 AM Performed by: Maxwell Caul Pre-anesthesia Checklist: Patient identified, Emergency Drugs available, Suction available and Patient being monitored Patient Re-evaluated:Patient Re-evaluated prior to inductionOxygen Delivery Method: Circle System Utilized Preoxygenation: Pre-oxygenation with 100% oxygen Intubation Type: IV induction Ventilation: Mask ventilation without difficulty Laryngoscope Size: Glidescope and 4 Grade View: Grade I Tube type: Oral Tube size: 7.5 mm Number of attempts: 1 Airway Equipment and Method: Stylet Placement Confirmation: ETT inserted through vocal cords under direct vision,  positive ETCO2 and breath sounds checked- equal and bilateral Secured at: 21 cm Tube secured with: Tape Dental Injury: Teeth and Oropharynx as per pre-operative assessment

## 2015-07-08 NOTE — Op Note (Signed)
KENYONNA JASPER [id number] 04/22/1970. 07/08/2015  Preoperative diagnosis:  1. Morbid obesity (BMI 42)  2. Idiopathic intracranial hypertension 3. GERD 4. Mild OSA 5. Prediabetes 6. dyslipidemia  Postoperative  diagnosis:  1. same  Surgical procedure: Laparoscopic Roux-en-Y gastric bypass (ante-colic, ante-gastric); upper endoscopy  Surgeon: [name], M.D. FACS  Asst.: Johnathan Hausen MD FACS  Anesthesia: General plus exparel  Complications: None   EBL: Minimal   Drains: None   Disposition: PACU in good condition   Indications for procedure: 45yo female with morbid obesity who has been unsuccessful at sustained weight loss. The patient's comorbidities are listed above. We discussed the risk and benefits of surgery including but not limited to anesthesia risk, bleeding, infection, blood clot formation, anastomotic leak, anastomotic stricture, ulcer formation, death, respiratory complications, intestinal blockage, internal hernia, gallstone formation, vitamin and nutritional deficiencies, injury to surrounding structures, failure to lose weight and mood changes.   Description of procedure: Patient is brought to the operating room and general anesthesia induced. The patient had received preoperative broad-spectrum IV antibiotics and subcutaneous heparin. The abdomen was widely sterilely prepped with Chloraprep and draped. Patient timeout was performed and correct patient and procedure confirmed. Access was obtained with a 12 mm Optiview trocar in the left upper quadrant and pneumoperitoneum established without difficulty. Under direct vision 12 mm trocars were placed laterally in the right upper quadrant, right upper quadrant midclavicular line, and to the left and above the umbilicus for the camera port. A 5 mm trocar was placed laterally in the left upper quadrant.  The omentum was brought into the upper abdomen and the transverse mesocolon elevated and the ligament of Treitz  clearly identified. A 40 cm biliopancreatic limb was then carefully measured from the ligament of Treitz. The small intestine was divided at this point with a single firing of the white load linear stapler. A Penrose drain was sutured to the end of the Roux-en-Y limb for later identification. A 100 cm Roux-en-Y limb was then carefully measured. At this point a side-to-side anastomosis was created between the Roux limb and the end of the biliopancreatic limb. This was accomplished with a single firing of the 60 mm white load linear stapler. The common enterotomy was longer than usual but was able to be closed with a running 2-0 Vicryl begun at either end of the enterotomy and tied centrally. I did place 2 interrupted 2-0 vicryl endostitchs where there was a small gap in running suture line. Eviceal tissue sealant was placed over the anastomosis. The mesenteric defect was then closed with running 2-0 silk. The omentum was then divided with the harmonic scalpel up towards the transverse colon to allow mobility of the Roux limb toward the gastric pouch. The patient was then placed in steep reversed Trendelenburg. Through a 5 mm subxiphoid site the [facility] retractor was placed and the left lobe of the liver elevated with excellent exposure of the upper stomach and hiatus. The angle of Hiss was then mobilized with the harmonic scalpel. A 4 cm gastric pouch was then carefully measured along the lesser curve of the stomach. Dissection was carried along the lesser curve at this point with the Harmonic scalpel working carefully back toward the lesser sac at right angles to the lesser curve. The free lesser sac was then entered. After being sure all tubes were removed from the stomach an initial firing of the gold load 60 mm linear stapler was fired at right angles across the lesser curve for about 4 cm.  The gastric pouch was further mobilized posteriorly and then the pouch was completed with 3 further firings of the 60 mm  blue load linear stapler up through the previously dissected angle of His. It was ensured that the pouch was completely mobilized away from the gastric remnant. This created a nice tubular 4-5 cm gastric pouch. The Roux limb was then brought up in an antecolic fashion with the candycane facing to the patient's left without undue tension. The gastrojejunostomy was created with an initial posterior row of 2-0 Vicryl between the Roux limb and the staple line of the gastric pouch. Enterotomies were then made in the gastric pouch and the Roux limb with the harmonic scalpel and at approximately 2-2-1/2 cm anastomosis was created with a single firing of the 32mm blue load linear stapler. The staple line was inspected and was intact without bleeding. The common enterotomy was then closed with running 2-0 Vicryl begun at either end and tied centrally. The Ewall tube was then easily passed through the anastomosis and an outer anterior layer of running 2-0 Vicryl was placed. The Ewald tube was removed. With the outlet of the gastrojejunostomy clamped and under saline irrigation the assistant performed upper endoscopy and with the gastric pouch tensely distended with air-there was no evidence of leak on this test. The pouch was desufflated. The Terance Hart defect was closed with running 2-0 silk. The abdomen was inspected for any evidence of bleeding or bowel injury and everything looked fine. The Nathanson retractor was removed under direct vision after coating the anastomosis with Eviceal tissue sealant. All CO2 was evacuated and trochars removed. Skin incisions were closed with 4-0 monocryl in a subcuticular fashion followed by Dermabond. Sponge needle and instrument counts were correct. The patient was taken to the PACU in good condition.    Leighton Ruff. Redmond Pulling, MD, FACS General, Bariatric, & Minimally Invasive Surgery [facility], [place]

## 2015-07-08 NOTE — Interval H&P Note (Signed)
History and Physical Interval Note:  07/08/2015 9:04 AM  Margaret Fletcher  has presented today for surgery, with the diagnosis of MORBID OBESITY  The various methods of treatment have been discussed with the patient and family. After consideration of risks, benefits and other options for treatment, the patient has consented to  Procedure(s): LAPAROSCOPIC ROUX-EN-Y GASTRIC BYPASS WITH HIATAL HERNIA REPAIR WITH UPPER ENDOSCOPY (N/A) as a surgical intervention .  The patient's history has been reviewed, patient examined, no change in status, stable for surgery.  I have reviewed the patient's chart and labs.  Questions were answered to the patient's satisfaction.    Leighton Ruff. Redmond Pulling, MD, [place], Bariatric, & Minimally Invasive Surgery [facility], [place]   [facility] M

## 2015-07-08 NOTE — H&P (View-Only) (Signed)
Margaret Fletcher 06/26/2015 9:45 AM Location: [facility] Patient #: [id number] DOB: 30-Mar-1970 Divorced / Language: [name] / Race: Black or African American Female   History of Present Illness [name] M. Keelan Pomerleau MD; 07/01/2015 3:40 PM) The patient is a 45 year old female who presents for a bariatric surgery evaluation. She comes in today for her preoperative evaluation. She has been scheduled and approved for laparoscopic Roux-en-Y gastric bypass surgery. It has been more than a year since her initial visit. For a few months after her initial visit last year she waffled about surgery. She ended up seeing a specialist because of her chronic migraines. She ended up losing her sense of smell and ultimately She ended up being diagnosed with idiopathic intracranial hypertension. She had an MRI of her brain that confirmed the diagnosis. Her neurologist has recommended weight loss as the best treatment for her idiopathic intracranial hypertension. She is not a candidate for the medication because she is allergic to it. She also underwent bilateral breast reduction surgery. She is still taking Topamax for her migraines. Her bariatric surgery evaluation showed a normal upper GI. She had mild obstructive sleep apnea. A lipid panel from March 16 showed a total cholesterol level of 209, HDL level of 39, LDL level of 155. Hemoglobin A1c level of 6.1. Her vitamin D level in February was 20. Otherwise she denies any chest pain, chest pressure, shortness of breath, dyspnea on exertion, orthopnea, paroxysmal nocturnal dyspnea, TIAs, amaurosis fugax, abdominal pain, melena, hematochezia, dysuria, hematuria.  I reviewed her MRI brain reports as well as her upper GI, chest x-ray reports. Her baseline creatinine is around 1.2 otherwise her comprehensive metabolic panel was within normal limits   05/02/14 She is referred by Dr Myriam Jacobson for evaluation of weight loss surgery. She is specifically  interested in laparoscopic Roux-en-Y gastric bypass. It appeals to her because it has the longest track record as well as the most data. She attended our seminar in person. She has had a problem with her weight since adulthood especially after her pregnancies. She has tried many different options over the years including Weight Watchers, Nutrisystem, juice diets, as well as phentermine all without any long-term success.  Her comorbidities include depression, vitamin D deficiency, snoring, gastroesophageal reflux  She denies any chest pain, short of breath, dyspnea on exertion, orthopnea, paroxysmal nocturnal dyspnea. She does have some edema. She does have some occasional reflux and takes Tagamet as needed. She has a daily bowel movement however she does alternate between loose stools and constipation. She has had a laparoscopic cholecystectomy. She states she has had an upper endoscopy by Dr. Oletta Lamas. She underwent an upper endoscopy in February 2015 which showed a medium-sized hiatal hernia. There is no evidence of Barrett esophagus or esophagitis or stricture. The stomach was normal. She had a normal endoscopy by him in 2013. She is also had several CTs and MRIs of her abdomen. She has a stable splenic cyst and liver hemangioma. The splenic cyst measures 2.9 cm. The liver hemangioma measures 1.9. She has had a gastric emptying study in 2015 which was normal.  She has had a C-section as well as ablation.   Problem List/Past Medical [name], MD; 07/01/2015 3:39 PM) DEPRESSION, CONTROLLED (F32.9) GASTROESOPHAGEAL REFLUX DISEASE WITHOUT ESOPHAGITIS (K21.9) HERNIA, HIATAL (K44.9) OBESITY, MORBID, BMI 40.0-49.9 (E66.01) SPLENIC CYST (D73.4) ALOPECIA (L65.9) VITAMIN D DEFICIENCY (E55.9) DYSLIPIDEMIA (HIGH LDL; LOW HDL) (E78.4) PREDIABETES (R73.03) IDIOPATHIC INTRACRANIAL HYPERTENSION (G93.2) MILD OBSTRUCTIVE SLEEP APNEA (G47.33)  Other Problems [name], MD;  07/01/2015 3:39 PM) Migraine Headache Hemorrhoids Anxiety Disorder  Past Surgical History [name], MD; 07/01/2015 3:39 PM) Oral Surgery Gallbladder Surgery - Laparoscopic Cesarean Section - Multiple Appendectomy  Diagnostic Studies History [name], MD; 07/01/2015 3:39 PM) Mammogram within last year Colonoscopy never Pap Smear 1-5 years ago  Allergies Marjean Donna, [place]; 06/26/2015 9:43 AM) Sulfa Antibiotics  Medication History [name], MD; 07/01/2015 3:39 PM) Biotin Active. Wellbutrin (75MG  Tablet, Oral) Active. Calcium + D (150-261-330MG -MG-IU Capsule, Oral) Active. Vitamin B12 (100MCG Tablet, Oral) Active. Magnesium Carbonate (250MG  Capsule, Oral) Active. Multiple Vitamin (Oral) Active. Vitamin B6 (200MG  Tablet, Oral) Active. Imitrex (25MG  Tablet, Oral) Active. Topiramate (100MG  Tablet, Oral) Active. Zofran ODT (4MG  Tablet Disperse, 1 (one) Tablet Disperse Oral every six hours, as needed, Taken starting 06/26/2015) Active. Protonix (40MG  Tablet DR, 1 (one) Tablet Oral daily, Taken starting 06/26/2015) Active. OxyCODONE HCl (5MG /5ML Solution, 5-10 Milliliter Oral every four hours, as needed, Taken starting 06/26/2015) Active.  Social History [name], MD; 07/01/2015 3:39 PM) Tobacco use Never smoker. No drug use Caffeine use Carbonated beverages, Coffee, Tea. Alcohol use Occasional alcohol use.  Family History [name], MD; 07/01/2015 3:39 PM) Cerebrovascular Accident Father. Breast Cancer Mother. Colon Polyps Father. Heart disease in female family member before age 47 Diabetes Mellitus Brother. Seizure disorder Son. Heart disease in female family member before age 77  Pregnancy / Birth History [name], MD; 07/01/2015 3:39 PM) Para 2 Maternal age 23-25 Irregular periods Gravida 3 Age at menarche 2 years.  Vitals ([name] [name]; 06/26/2015 9:42 AM) 06/26/2015 9:41 AM Weight: 273.4 lb  Height: 68in Body Surface Area: 2.33 m Body Mass Index: 41.57 kg/m  Temp.: 61F(Temporal)  Pulse: 81 (Regular)  BP: 130/76 (Sitting, Left Arm, Standard)       Physical Exam [name] M. Fannie Alomar MD; 07/01/2015 3:34 PM) General Mental Status-Alert. General Appearance-Consistent with stated age. Hydration-Well hydrated. Voice-Normal. Note: MORBIDLY OBESE   Integumentary Note: Trace lower extremity edema; alopecia   Head and Neck Head-normocephalic, atraumatic with no lesions or palpable masses. Trachea-midline. Thyroid Gland Characteristics - normal size and consistency.  Eye Eyeball - Bilateral-Extraocular movements intact. Sclera/Conjunctiva - Bilateral-No scleral icterus.  Chest and Lung Exam Chest and lung exam reveals -quiet, even and easy respiratory effort with no use of accessory muscles and on auscultation, normal breath sounds, no adventitious sounds and normal vocal resonance. Inspection Chest Wall - Normal. Back - normal.  Breast - Did not examine.  Cardiovascular Cardiovascular examination reveals -normal heart sounds, regular rate and rhythm with no murmurs and normal pedal pulses bilaterally.  Abdomen Inspection Inspection of the abdomen reveals - No Hernias. Skin - Scar - no surgical scars. Palpation/Percussion Palpation and Percussion of the abdomen reveal - Soft, Non Tender, No Rebound tenderness, No Rigidity (guarding) and No hepatosplenomegaly. Auscultation Auscultation of the abdomen reveals - Bowel sounds normal.  Peripheral Vascular Upper Extremity Palpation - Pulses bilaterally normal.  Neurologic Neurologic evaluation reveals -alert and oriented x 3 with no impairment of recent or remote memory. Mental Status-Normal.  Neuropsychiatric The patient's mood and affect are described as -normal. Judgment and Insight-insight is appropriate concerning matters relevant to self.  Musculoskeletal Normal  Exam - Left-Upper Extremity Strength Normal and Lower Extremity Strength Normal. Normal Exam - Right-Upper Extremity Strength Normal and Lower Extremity Strength Normal.  Lymphatic Head & Neck  General Head & Neck Lymphatics: Bilateral - Description - Normal. Axillary - Did not examine.  Femoral & Inguinal - Did not examine.    Assessment & Plan [name] M. Maicie Vanderloop MD; 07/01/2015 3:39 PM) OBESITY, MORBID, BMI 40.0-49.9 (E66.01) Impression: The patient still meets weight loss surgery criteria. I think the patient would be an acceptable candidate for Laparoscopic Roux-en-Y Gastric bypass. We briefly discussed sleeve gastrectomy as she asked about it. After discussion she elected to stay with the laparoscopic Roux-en-Y gastric bypass procedure. We rediscussed the steps of surgery. We also discussed the typical postoperative course. We reviewed her preoperative workup. We also discussed the typical risk and potential complications after surgery. All of her questions were asked and answered. She was given her postoperative prescriptions for pain, nausea, and heartburn. She was also given her preoperative bowel prep instructions.. I encouraged her to contact me should she have any questions between now and surgery. I spent approximately one hour with her. Current Plans Pt Education - EMW_preopbariatric Started Zofran ODT 4MG , 1 (one) Tablet Disperse every six hours, as needed, #20, 06/26/2015, No Refill. Started Protonix 40MG , 1 (one) Tablet daily, #30, 30 days starting 06/26/2015, Ref. x1. Started OxyCODONE HCl 5MG /5ML, 5-10 Milliliter every four hours, as needed, 200 Milliliter, 06/26/2015, No Refill. 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 Redmond Pulling, MD, FACS General, Bariatric, & Minimally Invasive Surgery [facility], [place]

## 2015-07-08 NOTE — Op Note (Signed)
Margaret Fletcher [id number] 21-Jan-1971 07/08/2015  Preoperative diagnosis: gastric bypass for obesity related headaches  Postoperative diagnosis: Same   Procedure: Upper endoscopy   Surgeon: Catalina Antigua B. Hassell Fletcher  M.D., FACS   Anesthesia: Gen.   Indications for procedure: This patient was undergoing a gastric bypass.    Description of procedure: The endoscopy was placed in the mouth and into the oropharynx and under endoscopic vision it was advanced to the esophagogastric junction.  The pouch was insufflated and it was passed down to the Carney.  The anastomosis was patent and no bleeding or bubbles were seen.  The pouch was about 5 cm long.  .   No bleeding or leaks were detected.  The scope was withdrawn without difficulty.     Margaret B. Hassell Done, MD, FACS General, Bariatric, & Minimally Invasive Surgery [facility], [place]

## 2015-07-09 ENCOUNTER — Inpatient Hospital Stay (HOSPITAL_COMMUNITY): Payer: 59

## 2015-07-09 LAB — CBC WITH DIFFERENTIAL/PLATELET
BASOS ABS: 0 10*3/uL (ref 0.0–0.1)
Basophils Relative: 0 %
EOS PCT: 0 %
Eosinophils Absolute: 0 10*3/uL (ref 0.0–0.7)
HEMATOCRIT: 37.2 % (ref 36.0–46.0)
Hemoglobin: 12.4 g/dL (ref 12.0–15.0)
LYMPHS ABS: 1.3 10*3/uL (ref 0.7–4.0)
LYMPHS PCT: 8 %
MCH: 30 pg (ref 26.0–34.0)
MCHC: 33.3 g/dL (ref 30.0–36.0)
MCV: 89.9 fL (ref 78.0–100.0)
MONO ABS: 1.2 10*3/uL — AB (ref 0.1–1.0)
MONOS PCT: 7 %
NEUTROS ABS: 13.9 10*3/uL — AB (ref 1.7–7.7)
Neutrophils Relative %: 85 %
Platelets: 302 10*3/uL (ref 150–400)
RBC: 4.14 MIL/uL (ref 3.87–5.11)
RDW: 14.2 % (ref 11.5–15.5)
WBC: 16.4 10*3/uL — ABNORMAL HIGH (ref 4.0–10.5)

## 2015-07-09 LAB — COMPREHENSIVE METABOLIC PANEL
ALBUMIN: 3.2 g/dL — AB (ref 3.5–5.0)
ALK PHOS: 54 U/L (ref 38–126)
ALT: 29 U/L (ref 14–54)
AST: 27 U/L (ref 15–41)
Anion gap: 7 (ref 5–15)
BILIRUBIN TOTAL: 0.3 mg/dL (ref 0.3–1.2)
BUN: 12 mg/dL (ref 6–20)
CO2: 21 mmol/L — ABNORMAL LOW (ref 22–32)
CREATININE: 1.2 mg/dL — AB (ref 0.44–1.00)
Calcium: 8.5 mg/dL — ABNORMAL LOW (ref 8.9–10.3)
Chloride: 111 mmol/L (ref 101–111)
GFR calc Af Amer: >60 mL/min (ref >60)
GFR calc non Af Amer: 54 mL/min — ABNORMAL LOW (ref >60)
GLUCOSE: 135 mg/dL — AB (ref 65–99)
POTASSIUM: 4.1 mmol/L (ref 3.5–5.1)
Sodium: 139 mmol/L (ref 135–145)
TOTAL PROTEIN: 6.5 g/dL (ref 6.5–8.1)

## 2015-07-09 LAB — HEMOGLOBIN AND HEMATOCRIT, BLOOD
HCT: 37.2 % (ref 36.0–46.0)
Hemoglobin: 12.3 g/dL (ref 12.0–15.0)

## 2015-07-09 MED ORDER — MORPHINE SULFATE (PF) 2 MG/ML IV SOLN
2.0000 mg | INTRAVENOUS | Status: DC | PRN
  Administered 2015-07-09 (×3): 2 mg via INTRAVENOUS
  Filled 2015-07-09 (×3): qty 1

## 2015-07-09 NOTE — Discharge Instructions (Signed)
° ° ° °GASTRIC BYPASS/SLEEVE ° Home Care Instructions ° ° These instructions are to help you care for yourself when you go home. ° °Call: If you have any problems. °• Call [phone] and ask for the surgeon on call °• If you need immediate assistance come to the ER at [facility]. Tell the ER staff you are a new post-op gastric bypass or gastric sleeve patient  °Signs and symptoms to report: • Severe  vomiting or nausea °o If you cannot handle clear liquids for longer than 1 day, call your surgeon °• Abdominal pain which does not get better after taking your pain medication °• Fever greater than 100.4°  F and chills °• Heart rate over 100 beats a minute °• Trouble breathing °• Chest pain °• Redness,  swelling, drainage, or foul odor at incision (surgical) sites °• If your incisions open or pull apart °• Swelling or pain in calf (lower leg) °• Diarrhea (Loose bowel movements that happen often), frequent watery, uncontrolled bowel movements °• Constipation, (no bowel movements for 3 days) if this happens: °o Take Milk of Magnesia, 2 tablespoons by mouth, 3 times a day for 2 days if needed °o Stop taking Milk of Magnesia once you have had a bowel movement °o Call your doctor if constipation continues °Or °o Take Miralax  (instead of Milk of Magnesia) following the label instructions °o Stop taking Miralax once you have had a bowel movement °o Call your doctor if constipation continues °• Anything you think is “abnormal for you” °  °Normal side effects after surgery: • Unable to sleep at night or unable to concentrate °• Irritability °• Being tearful (crying) or depressed ° °These are common complaints, possibly related to your anesthesia, stress of surgery, and change in lifestyle, that usually go away a few weeks after surgery. If these feelings continue, call your medical doctor.  °Wound Care: You may have surgical glue, steri-strips, or staples over your incisions after surgery °• Surgical glue: Looks like clear  film over your incisions and will wear off a little at a time °• Steri-strips: Adhesive strips of tape over your incisions. You may notice a yellowish color on skin under the steri-strips. This is used to make the steri-strips stick better. Do not pull the steri-strips off - let them fall off °• Staples: Staples may be removed before you leave the hospital °o If you go home with staples, call [facility] for an appointment with your surgeon’s nurse to have staples removed 10 days after surgery, [phone] °• Showering: You may shower two (2) days after your surgery unless your surgeon tells you differently °o Wash gently around incisions with warm soapy water, rinse well, and gently pat dry °o If you have a drain (tube from your incision), you may need someone to hold this while you shower °o No tub baths until staples are removed and incisions are healed °  °Medications: • Medications should be liquid or crushed if larger than the size of a dime °• Extended release pills (medication that releases a little bit at a time through the  day) should not be crushed °• Depending on the size and number of medications you take, you may need to space (take a few throughout the day)/change the time you take your medications so that you do not over-fill your pouch (smaller stomach) °• Make sure you follow-up with you primary care physician to make medication changes needed during rapid weight loss and life -style changes °•   If you have diabetes, follow up with your doctor that orders your diabetes medication(s) within one week after surgery and check your blood sugar regularly   Do not drive while taking narcotics (pain medications)   Do not take acetaminophen (Tylenol) and Roxicet or Lortab Elixir at the same time since these pain medications contain acetaminophen   Diet:  First 2 Weeks You will see the nutritionist about two (2) weeks after your surgery. The nutritionist will increase the types of  foods you can eat if you are handling liquids well:  If you have severe vomiting or nausea and cannot handle clear liquids lasting longer than 1 day call your surgeon Protein Shake  Drink at least 2 ounces of shake 5-6 times per day  Each serving of protein shakes (usually 8-12 ounces) should have a minimum of: o 15 grams of protein o And no more than 5 grams of carbohydrate  Goal for protein each day: o Men = 80 grams per day o Women = 60 grams per day     Protein powder may be added to fluids such as non-fat milk or Lactaid milk or Soy milk (limit to 35 grams added protein powder per serving)  Hydration  Slowly increase the amount of water and other clear liquids as tolerated (See Acceptable Fluids)  Slowly increase the amount of protein shake as tolerated  Sip fluids slowly and throughout the day  May use sugar substitutes in small amounts (no more than 6-8 packets per day; i.e. Splenda)  Fluid Goal  The first goal is to drink at least 8 ounces of protein shake/drink per day (or as directed by the nutritionist); some examples of protein shakes are Johnson [employer] Johnson, AMR Corporation, EAS Edge HP, and Unjury. - See handout from pre-op Bariatric Education Class: o Slowly increase the amount of protein shake you drink as tolerated o You may find it easier to slowly sip shakes throughout the day o It is important to get your proteins in first  Your fluid goal is to drink 64-100 ounces of fluid daily o It may take a few weeks to build up to this   32 oz. (or more) should be clear liquids And  32 oz. (or more) should be full liquids (see below for examples)  Liquids should not contain sugar, caffeine, or carbonation  Clear Liquids:  Water of Sugar-free flavored water (i.e. Fruit HO, Propel)  Decaffeinated coffee or tea (sugar-free)  Crystal lite, Wylers Lite, Minute Maid Lite  Sugar-free Jell-O  Bouillon or broth  Sugar-free Popsicle:    - Less than 20 calories  each; Limit 1 per day  Full Liquids:                   Protein Shakes/Drinks + 2 choices per day of other full liquids  Full liquids must be: o No More Than 15 grams of Carbs per serving o No More Than 3 grams of Fat per serving  Strained low-fat cream soup  Non-Fat milk  Fat-free Lactaid Milk  Sugar-free yogurt (Dannon Lite & Fit, Greek yogurt)    Vitamins and Minerals  Start 1 day after surgery unless otherwise directed by your surgeon  2 Chewable Multivitamin / Multimineral Supplement with iron (i.e. Centrum for Adults)  Vitamin B-12, 350-500 micrograms sub-lingual (place tablet under the tongue) each day  Chewable Calcium Citrate with Vitamin D-3 (Example: 3 Chewable Calcium  Plus 600 with Vitamin D-3) o Take 500 mg three (3) times a day for  a total of 1500 mg each day o Do not take all 3 doses of calcium at one time as it may cause constipation, and you can only absorb 500 mg at a time o Do not mix multivitamins containing iron with calcium supplements;  take 2 hours apart o Do not substitute Tums (calcium carbonate) for your calcium  Menstruating women and those at risk for anemia ( a blood disease that causes weakness) may need extra iron o Talk to your doctor to see if you need more iron  If you need extra iron: Total daily Iron recommendation (including Vitamins) is 50 to 100 mg Iron/day  Do not stop taking or change any vitamins or minerals until you talk to your nutritionist or surgeon  Your nutritionist and/or surgeon must approve all vitamin and mineral supplements   Activity and Exercise: It is important to continue walking at home. Limit your physical activity as instructed by your doctor. During this time, use these guidelines:  Do not lift anything greater than ten  (10) pounds for at least two (2) weeks  Do not go back to work or drive until Engineer, production says you can  You may have sex when you feel comfortable o It is VERY important for female  patients to use a reliable birth control method; fertility often increase after surgery o Do not get pregnant for at least 18 months  Start exercising as soon as your doctor tells you that you can o Make sure your doctor approves any physical activity  Start with a simple walking program  Walk 5-15 minutes each day, 7 days per week  Slowly increase until you are walking 30-45 minutes per day  Consider joining our [place] program. [phone] or email [email or web address]   Special Instructions Things to remember:  Free counseling is available for you and your family through collaboration between [facility] and [facility]. Please call [phone] and leave a message  Use your CPAP when sleeping if this applies to you  Consider buying a medical alert bracelet that says you had lap-band surgery     You will likely have your first fill (fluid added to your band) 6 - 8 weeks after surgery  [facility] has a free Bariatric Surgery Support Group that meets monthly, the 3rd Thursday, [place]. You can see classes online at [email or web address]  It is very important to keep all follow up appointments with your surgeon, nutritionist, primary care physician, and behavioral health practitioner o After the first year, please follow up with your bariatric surgeon and nutritionist at least once a year in order to maintain best weight loss results                    [facility]:  [place]: [phone]               Bariatric Nurse Coordinator: [phone]  Gastric Bypass/Sleeve Home Care Instructions  Rev. 04/2012                                                         Reviewed and Vilinda Boehringer  by [facility] Patient Education Committee, Jan, 2014 ° ° ° ° ° ° ° ° ° °

## 2015-07-09 NOTE — Anesthesia Postprocedure Evaluation (Signed)
Anesthesia Post Note  Patient: JOHNYE OSTROFF  Procedure(s) Performed: Procedure(s) (LRB): LAPAROSCOPIC ROUX-EN-Y GASTRIC BYPASS WITH HIATAL HERNIA REPAIR WITH UPPER ENDOSCOPY (N/A)  Patient location during evaluation: PACU Anesthesia Type: General Level of consciousness: awake Pain management: pain level controlled Vital Signs Assessment: post-procedure vital signs reviewed and stable Respiratory status: spontaneous breathing Cardiovascular status: stable Anesthetic complications: no    Last Vitals:  Filed Vitals:   07/09/15 1431 07/09/15 1826  BP: 128/78 128/89  Pulse: 70 66  Temp: 37.1 C 37 C  Resp: 18 18    Last Pain:  Filed Vitals:   07/09/15 1903  PainSc: 4                  EDWARDS,Salimatou Simone

## 2015-07-09 NOTE — Progress Notes (Signed)
1 Day Post-Op  Subjective: Ambulated a lot yesterday. Had some mild epigastric pain yesterday but overnight developed more Left sided abd pain with some nausea. Doing IS  Objective: Vital signs in last 24 hours: Temp:  [97.4 F (36.3 C)-99.4 F (37.4 C)] 99.4 F (37.4 C) (05/02 0200) Pulse Rate:  [52-83] 74 (05/02 0200) Resp:  [10-25] 16 (05/01 1836) BP: (118-158)/(68-95) 126/77 mmHg (05/02 0200) SpO2:  [96 %-100 %] 100 % (05/01 1836) Weight:  [119.069 kg (262 lb 8 oz)] 119.069 kg (262 lb 8 oz) (05/01 0803)    Intake/Output from previous day: 05/01 0701 - 05/02 0700 In: 3787.5 [I.V.:3787.5] Out: [id number] [Urine:1555; Blood:25] Intake/Output this shift:    Alert, sitting upright in bed. Not ill appearing. Smiling Symmetric chest rise Reg Obese, soft, incisions c/d/i, some L sided TTP but no rebound/guarding No edema  Lab Results:   Recent Labs  07/08/15 1340 07/09/15 0429  WBC  --  16.4*  HGB 13.0 12.4  HCT 39.5 37.2  PLT  --  302   BMET  Recent Labs  07/09/15 0429  NA 139  K 4.1  CL 111  CO2 21*  GLUCOSE 135*  BUN 12  CREATININE 1.20*  CALCIUM 8.5*   PT/INR No results for input(s): LABPROT, INR in the last 72 hours. ABG No results for input(s): PHART, HCO3 in the last 72 hours.  Invalid input(s): PCO2, PO2  Studies/Results: No results found.  Anti-infectives: Anti-infectives    Start     Dose/Rate Route Frequency Ordered Stop   07/08/15 0820  cefoTEtan in Dextrose 5% (CEFOTAN) IVPB 2 g     2 g Intravenous On call to O.R. 07/08/15 0820 07/08/15 0955      Assessment/Plan: s/p Procedure(s): LAPAROSCOPIC ROUX-EN-Y GASTRIC BYPASS WITH HIATAL HERNIA REPAIR WITH UPPER ENDOSCOPY (N/A)  Overall looks ok. No fever. No tachycardia.  If UGI is ok this am, will start POD 1 diet pulm toilet, ambulate Chemical VTE prophylaxis Will restart essential home meds after UGI. Pt may have migraine meds before swallow.   Leighton Ruff. Redmond Pulling, MD, FACS General,  Bariatric, & Minimally Invasive Surgery [facility], [place]   LOS: 1 day    [name] 07/09/2015

## 2015-07-09 NOTE — Progress Notes (Signed)
Patient alert and oriented, Post op day 1.  Provided support and encouragement.  Encouraged pulmonary toilet, ambulation and small sips of liquids.  All questions answered.  Will continue to monitor. 

## 2015-07-09 NOTE — Plan of Care (Signed)
Problem: Food- and Nutrition-Related Knowledge Deficit (NB-1.1) Goal: Nutrition education Formal process to instruct or train a patient/client in a skill or to impart knowledge to help patients/clients voluntarily manage or modify food choices and eating behavior to maintain or improve health. Outcome: Completed/Met Date Met:  07/09/15 Nutrition Education Note  Received consult for diet education per DROP protocol.   Discussed 2 week post op diet with pt. Emphasized that liquids must be non carbonated, non caffeinated, and sugar free. Fluid goals discussed. Reviewed progression of diet to include soft proteins at 7-10 days post-op. Pt to follow up with outpatient bariatric RD for further diet progression after 2 weeks. Multivitamins and minerals also reviewed. Teach back method used, pt expressed understanding, expect good compliance.   Diet: First 2 Weeks  You will see the dietitian about two (2) weeks after your surgery. The dietitian will increase the types of foods you can eat if you are handling liquids well:  If you have severe vomiting or nausea and cannot handle clear liquids lasting longer than 1 day, call your surgeon  Protein Shake  Drink at least 2 ounces of shake 5-6 times per day  Each serving of protein shakes (usually 8 - 12 ounces) should have a minimum of:  15 grams of protein  And no more than 5 grams of carbohydrate  Goal for protein each day:  Men = 80 grams per day  Women = 60 grams per day  Protein powder may be added to fluids such as non-fat milk or Lactaid milk or Soy milk (limit to 35 grams added protein powder per serving)   Hydration  Slowly increase the amount of water and other clear liquids as tolerated (See Acceptable Fluids)  Slowly increase the amount of protein shake as tolerated  Sip fluids slowly and throughout the day  May use sugar substitutes in small amounts (no more than 6 - 8 packets per day; i.e. Splenda)   Fluid Goal  The first goal is to  drink at least 8 ounces of protein shake/drink per day (or as directed by the nutritionist); some examples of protein shakes are Johnson & Johnson, AMR Corporation, EAS Edge HP, and Unjury. See handout from pre-op Bariatric Education Class:  Slowly increase the amount of protein shake you drink as tolerated  You may find it easier to slowly sip shakes throughout the day  It is important to get your proteins in first  Your fluid goal is to drink 64 - 100 ounces of fluid daily  It may take a few weeks to build up to this  32 oz (or more) should be clear liquids  And  32 oz (or more) should be full liquids (see below for examples)  Liquids should not contain sugar, caffeine, or carbonation   Clear Liquids:  Water or Sugar-free flavored water (i.e. Fruit H2O, Propel)  Decaffeinated coffee or tea (sugar-free)  Crystal Lite, Wyler's Lite, Minute Maid Lite  Sugar-free Jell-O  Bouillon or broth  Sugar-free Popsicle: *Less than 20 calories each; Limit 1 per day   Full Liquids:  Protein Shakes/Drinks + 2 choices per day of other full liquids  Full liquids must be:  No More Than 12 grams of Carbs per serving  No More Than 3 grams of Fat per serving  Strained low-fat cream soup  Non-Fat milk  Fat-free Lactaid Milk  Sugar-free yogurt (Dannon Lite & Fit, Greek yogurt)     Clayton Bibles, MS, RD, LDN Pager: [phone] After Hours Pager: [phone]

## 2015-07-09 NOTE — Consult Note (Signed)
   [facility] CM Inpatient Consult   07/09/2015  Margaret Fletcher 19-Dec-1970 [id number]    Spoke with patient at bedside to discuss Link to Wellness program for [place] employees/dependents with [employer]. Explained program and benefits. Patient denies having any Link to Wellness needs at this time. Accepted packet, brochure, and contact information. Declines post hospital follow telephone call as well. Will make inpatient RNCM aware.  Marthenia Rolling, MSN-Ed, RN,BSN [facility] Liaison [phone]

## 2015-07-09 NOTE — Progress Notes (Signed)
Patient alert and oriented, pain is controlled. Patient is tolerating fluids, plan to advance to protein shake tomorrow. Reviewed Gastric Bypass discharge instructions with patient and patient is able to articulate understanding. Provided information on BELT program, Support Group and [facility] outpatient pharmacy. All questions answered, will continue to monitor.    

## 2015-07-10 LAB — CBC WITH DIFFERENTIAL/PLATELET
BASOS ABS: 0.1 10*3/uL (ref 0.0–0.1)
Basophils Relative: 1 %
Eosinophils Absolute: 0.2 10*3/uL (ref 0.0–0.7)
Eosinophils Relative: 2 %
HEMATOCRIT: 37 % (ref 36.0–46.0)
Hemoglobin: 12.3 g/dL (ref 12.0–15.0)
LYMPHS PCT: 18 %
Lymphs Abs: 2.2 10*3/uL (ref 0.7–4.0)
MCH: 29.9 pg (ref 26.0–34.0)
MCHC: 33.2 g/dL (ref 30.0–36.0)
MCV: 89.8 fL (ref 78.0–100.0)
MONO ABS: 0.8 10*3/uL (ref 0.1–1.0)
Monocytes Relative: 7 %
NEUTROS ABS: 9.4 10*3/uL — AB (ref 1.7–7.7)
Neutrophils Relative %: 74 %
Platelets: 297 10*3/uL (ref 150–400)
RBC: 4.12 MIL/uL (ref 3.87–5.11)
RDW: 14.7 % (ref 11.5–15.5)
WBC: 12.7 10*3/uL — ABNORMAL HIGH (ref 4.0–10.5)

## 2015-07-10 MED ORDER — SIMETHICONE 40 MG/0.6ML PO SUSP
40.0000 mg | Freq: Four times a day (QID) | ORAL | Status: DC | PRN
  Administered 2015-07-10: 40 mg via ORAL
  Filled 2015-07-10 (×2): qty 0.6

## 2015-07-10 MED ORDER — OXYCODONE HCL 5 MG/5ML PO SOLN: 5.0000 mg | ORAL | Status: DC | PRN

## 2015-07-10 NOTE — Discharge Summary (Signed)
Physician Discharge Summary  Margaret Fletcher [id number] DOB: 03-31-1970 DOA: 07/08/2015  PCP: [name], MD  Admit date: 07/08/2015 Discharge date: 07/10/2015  Recommendations for Outpatient Follow-up:   Follow-up Information    Follow up with [name], MD. Go on 07/26/2015.   Specialty:  General Surgery   Why:  For Post-Op Check at 2:00   Contact information:   [address] [place] [address] [phone]       Follow up with [name], MD. Go on 08/21/2015.   Specialty:  General Surgery   Why:  For Post-Op Check at 11:15   Contact information:   [place] [address] [phone]      Discharge Diagnoses:  Active Problems:   Chronic migraine without aura or status migrainosus   IIH (idiopathic intracranial hypertension)   Prediabetes   Morbid obesity (HCC)   Mild obstructive sleep apnea   Dyslipidemia   S/P gastric bypass   Surgical Procedure: Laparoscopic Roux-en-Y gastric bypass, upper endoscopy  Discharge Condition: Good Disposition: Home  Diet recommendation: Postoperative gastric bypass diet  Filed Weights   07/08/15 0803 07/09/15 0920 07/10/15 0948  Weight: 119.069 kg (262 lb 8 oz) 121.383 kg (267 lb 9.6 oz) 121.292 kg (267 lb 6.4 oz)     Hospital Course:  The patient was admitted for a planned laparoscopic Roux-en-Y gastric bypass. Please see operative note. Preoperatively the patient was given 5000 units of subcutaneous heparin for DVT prophylaxis. Postoperative prophylactic Lovenox dosing was started on the morning of postoperative day 1. The patient underwent an upper GI on postoperative day 1 which demonstrated no extravasation of contrast and emptying of the contrast into the Roux limb. The patient was started on ice chips and water which they tolerated. She was maintained on her migraine meds throughout her stay since they are so severe.  On postoperative day 2 The patient's diet was advanced to protein  shakes which they also tolerated as well as an apporiate amount.  The patient was ambulating without difficulty. Their vital signs are stable without fever or tachycardia. Their hemoglobin had remained stable. The patient had received discharge instructions and counseling. They were deemed stable for discharge.  BP 146/84 mmHg  Pulse 68  Temp(Src) 98.5 F (36.9 C) (Oral)  Resp 18  Ht 5\' 8"  (1.727 m)  Wt 121.292 kg (267 lb 6.4 oz)  BMI 40.67 kg/m2  SpO2 100%  LMP 06/24/2015  Gen: alert, NAD, non-toxic appearing; looks comfortable. Left sided pain gone. Took water well.  Pupils: equal, no scleral icterus Pulm: Lungs clear to auscultation, symmetric chest rise CV: regular rate and rhythm Abd: soft, mild approp tender, nondistended. No cellulitis. No incisional hernia Ext: no edema, no calf tenderness Skin: no rash, no jaundice   Discharge Instructions  Discharge Instructions    Ambulate hourly while awake    Complete by:  As directed      Call MD for:  difficulty breathing, headache or visual disturbances    Complete by:  As directed      Call MD for:  persistant dizziness or light-headedness    Complete by:  As directed      Call MD for:  persistant nausea and vomiting    Complete by:  As directed      Call MD for:  redness, tenderness, or signs of infection (pain, swelling, redness, odor or green/yellow discharge around incision site)    Complete by:  As directed  Call MD for:  severe uncontrolled pain    Complete by:  As directed      Call MD for:  temperature >101 F    Complete by:  As directed      Diet bariatric full liquid    Complete by:  As directed      Discharge instructions    Complete by:  As directed   See bariatric discharge instructions     Incentive spirometry    Complete by:  As directed   Perform hourly while awake            Medication List    STOP taking these medications        ibuprofen 600 MG tablet  Commonly known as:  ADVIL,MOTRIN       TAKE these medications        Albuterol Sulfate 108 (90 Base) MCG/ACT Aepb  Commonly known as:  PROAIR RESPICLICK  Inhale 2 puffs into the lungs every 6 (six) hours as needed.     ALPRAZolam 0.5 MG tablet  Commonly known as:  XANAX  Take 1 tablet (0.5 mg total) by mouth 2 (two) times daily as needed for anxiety. Can take 1-2 30 minutes before MRI. Do not drive.     buPROPion 300 MG 24 hr tablet  Commonly known as:  WELLBUTRIN XL  Take 1 tablet (300 mg total) by mouth daily.  Notes to Patient:  This medication cannot be crushed or cut in half, if larger than a dime will need to be changed to an immediate release formula     multivitamin with minerals Tabs tablet  Take 1 tablet by mouth daily.     oxyCODONE 5 MG/5ML solution  Commonly known as:  ROXICODONE  Take 5-10 mLs (5-10 mg total) by mouth every 4 (four) hours as needed for moderate pain or severe pain.     SUMAtriptan 100 MG tablet  Commonly known as:  IMITREX  Take 1 tablet (100 mg total) by mouth once as needed for migraine. May repeat in 2 hours if headache persists or recurs.     topiramate 100 MG tablet  Commonly known as:  TOPAMAX  Take 1.5 tablets (150 mg total) by mouth 2 (two) times daily.           Follow-up Information    Follow up with [name], MD. Go on 07/26/2015.   Specialty:  General Surgery   Why:  For Post-Op Check at 2:00   Contact information:   [address] [place] [address] [phone]       Follow up with [name], MD. Go on 08/21/2015.   Specialty:  General Surgery   Why:  For Post-Op Check at 11:15   Contact information:   [place] [address] [phone]        The results of significant diagnostics from this hospitalization (including imaging, microbiology, ancillary and laboratory) are listed below for reference.    Significant Diagnostic Studies: Dg Ugi W/water Sol Cm  07/09/2015  CLINICAL DATA:  Status post gastric  bypass surgery. EXAM: WATER SOLUBLE UPPER GI SERIES TECHNIQUE: Single-column upper GI series was performed using water soluble contrast. CONTRAST:  50 cc of water-soluble contrast material peer COMPARISON:  None. FLUOROSCOPY TIME:  Radiation Exposure Index (as provided by the fluoroscopic device): 88.9 mGy If the device does not provide the exposure index: Fluoroscopy Time (in minutes and seconds):  24 seconds Number of Acquired  Images: FINDINGS: Ingestion of water-soluble contrast material opacifies the upper GI tract. Anatomy compatible with Roux-en-Y gastric bypass surgery is identified. There is normal antegrade progression of the enteric contrast material through the gastric remnant and into the proximal jejunum. No extravasation of contrast material identified to suggest leakage. IMPRESSION: 1. No complications status post Roux-en-Y gastric bypass surgery. Electronically Signed   By: Kerby Moors M.D.   On: 07/09/2015 10:47    Labs: Basic Metabolic Panel:  Recent Labs Lab 07/09/15 0429  NA 139  K 4.1  CL 111  CO2 21*  GLUCOSE 135*  BUN 12  CREATININE 1.20*  CALCIUM 8.5*   Liver Function Tests:  Recent Labs Lab 07/09/15 0429  AST 27  ALT 29  ALKPHOS 54  BILITOT 0.3  PROT 6.5  ALBUMIN 3.2*    CBC:  Recent Labs Lab 07/08/15 1340 07/09/15 0429 07/09/15 1544 07/10/15 0431  WBC  --  16.4*  --  12.7*  NEUTROABS  --  13.9*  --  9.4*  HGB 13.0 12.4 12.3 12.3  HCT 39.5 37.2 37.2 37.0  MCV  --  89.9  --  89.8  PLT  --  302  --  297    CBG: No results for input(s): GLUCAP in the last 168 hours.  Active Problems:   Chronic migraine without aura or status migrainosus   IIH (idiopathic intracranial hypertension)   Prediabetes   Morbid obesity (HCC)   Mild obstructive sleep apnea   Dyslipidemia   S/P gastric bypass   Time coordinating discharge: 15 minutes  Signed:  [name], MD [facility], [place] [phone] 07/10/2015, 5:27 PM

## 2015-07-10 NOTE — Progress Notes (Signed)
Discharge instructions given to patient questions answered 

## 2015-07-11 NOTE — Telephone Encounter (Signed)
Called pt back to discuss this and check status. Pt reported that she is doing OK after surgery on Monday. She reported that she had come in and seen Dr L who increased her to the 300 mg dosage. Advised that she go ahead and check with her surgeon about the IR, SR vs XL version. Pt agreed.

## 2015-07-15 ENCOUNTER — Encounter (HOSPITAL_COMMUNITY): Payer: Self-pay | Admitting: Emergency Medicine

## 2015-07-15 ENCOUNTER — Emergency Department (HOSPITAL_COMMUNITY): Payer: 59

## 2015-07-15 ENCOUNTER — Inpatient Hospital Stay (HOSPITAL_COMMUNITY)
Admission: EM | Admit: 2015-07-15 | Discharge: 2015-07-19 | DRG: 381 | Disposition: A | Discharge status: Home Health | Payer: 59 | Attending: General Surgery | Admitting: General Surgery

## 2015-07-15 DIAGNOSIS — M542 Cervicalgia: Secondary | ICD-10-CM | POA: Diagnosis not present

## 2015-07-15 DIAGNOSIS — G932 Benign intracranial hypertension: Secondary | ICD-10-CM | POA: Diagnosis present

## 2015-07-15 DIAGNOSIS — Z79899 Other long term (current) drug therapy: Secondary | ICD-10-CM

## 2015-07-15 DIAGNOSIS — E860 Dehydration: Secondary | ICD-10-CM | POA: Diagnosis not present

## 2015-07-15 DIAGNOSIS — R1013 Epigastric pain: Secondary | ICD-10-CM | POA: Diagnosis not present

## 2015-07-15 DIAGNOSIS — G8918 Other acute postprocedural pain: Secondary | ICD-10-CM

## 2015-07-15 DIAGNOSIS — G4733 Obstructive sleep apnea (adult) (pediatric): Secondary | ICD-10-CM | POA: Diagnosis not present

## 2015-07-15 DIAGNOSIS — Z9049 Acquired absence of other specified parts of digestive tract: Secondary | ICD-10-CM

## 2015-07-15 DIAGNOSIS — G43909 Migraine, unspecified, not intractable, without status migrainosus: Secondary | ICD-10-CM | POA: Diagnosis not present

## 2015-07-15 DIAGNOSIS — Z6841 Body Mass Index (BMI) 40.0 and over, adult: Secondary | ICD-10-CM

## 2015-07-15 DIAGNOSIS — R109 Unspecified abdominal pain: Secondary | ICD-10-CM | POA: Diagnosis not present

## 2015-07-15 DIAGNOSIS — Z9884 Bariatric surgery status: Secondary | ICD-10-CM | POA: Diagnosis not present

## 2015-07-15 DIAGNOSIS — K289 Gastrojejunal ulcer, unspecified as acute or chronic, without hemorrhage or perforation: Principal | ICD-10-CM | POA: Diagnosis present

## 2015-07-15 DIAGNOSIS — Z882 Allergy status to sulfonamides status: Secondary | ICD-10-CM

## 2015-07-15 LAB — CBC WITH DIFFERENTIAL/PLATELET
BASOS ABS: 0.1 10*3/uL (ref 0.0–0.1)
BASOS PCT: 1 %
EOS ABS: 0.3 10*3/uL (ref 0.0–0.7)
Eosinophils Relative: 4 %
HCT: 39.7 % (ref 36.0–46.0)
HEMOGLOBIN: 13.9 g/dL (ref 12.0–15.0)
Lymphocytes Relative: 16 %
Lymphs Abs: 1.5 10*3/uL (ref 0.7–4.0)
MCH: 30.5 pg (ref 26.0–34.0)
MCHC: 35 g/dL (ref 30.0–36.0)
MCV: 87.3 fL (ref 78.0–100.0)
MONOS PCT: 7 %
Monocytes Absolute: 0.6 10*3/uL (ref 0.1–1.0)
NEUTROS PCT: 73 %
Neutro Abs: 6.8 10*3/uL (ref 1.7–7.7)
Platelets: 362 10*3/uL (ref 150–400)
RBC: 4.55 MIL/uL (ref 3.87–5.11)
RDW: 14.3 % (ref 11.5–15.5)
WBC: 9.4 10*3/uL (ref 4.0–10.5)

## 2015-07-15 LAB — URINE MICROSCOPIC-ADD ON

## 2015-07-15 LAB — URINALYSIS, ROUTINE W REFLEX MICROSCOPIC
BILIRUBIN URINE: NEGATIVE
Glucose, UA: NEGATIVE mg/dL
KETONES UR: 15 mg/dL — AB
LEUKOCYTES UA: NEGATIVE
NITRITE: NEGATIVE
Protein, ur: NEGATIVE mg/dL
SPECIFIC GRAVITY, URINE: 1.021 (ref 1.005–1.030)
pH: 5.5 (ref 5.0–8.0)

## 2015-07-15 LAB — COMPREHENSIVE METABOLIC PANEL
ALK PHOS: 62 U/L (ref 38–126)
ALT: 40 U/L (ref 14–54)
ANION GAP: 11 (ref 5–15)
AST: 26 U/L (ref 15–41)
Albumin: 3.8 g/dL (ref 3.5–5.0)
BILIRUBIN TOTAL: 0.5 mg/dL (ref 0.3–1.2)
BUN: 23 mg/dL — ABNORMAL HIGH (ref 6–20)
CALCIUM: 9.3 mg/dL (ref 8.9–10.3)
CO2: 20 mmol/L — AB (ref 22–32)
CREATININE: 1.64 mg/dL — AB (ref 0.44–1.00)
Chloride: 108 mmol/L (ref 101–111)
GFR, EST AFRICAN AMERICAN: 43 mL/min — AB (ref >60)
GFR, EST NON AFRICAN AMERICAN: 37 mL/min — AB (ref >60)
Glucose, Bld: 86 mg/dL (ref 65–99)
Potassium: 3.8 mmol/L (ref 3.5–5.1)
SODIUM: 139 mmol/L (ref 135–145)
TOTAL PROTEIN: 7.5 g/dL (ref 6.5–8.1)

## 2015-07-15 LAB — LIPASE, BLOOD: LIPASE: 40 U/L (ref 11–51)

## 2015-07-15 MED ORDER — MORPHINE SULFATE (PF) 2 MG/ML IV SOLN
1.0000 mg | INTRAVENOUS | Status: DC | PRN
  Administered 2015-07-15 – 2015-07-16 (×5): 2 mg via INTRAVENOUS
  Administered 2015-07-16: 3 mg via INTRAVENOUS
  Administered 2015-07-16 – 2015-07-19 (×13): 2 mg via INTRAVENOUS
  Filled 2015-07-15 (×17): qty 1
  Filled 2015-07-15: qty 2
  Filled 2015-07-15: qty 1

## 2015-07-15 MED ORDER — HEPARIN SODIUM (PORCINE) 5000 UNIT/ML IJ SOLN
5000.0000 [IU] | Freq: Three times a day (TID) | INTRAMUSCULAR | Status: DC
  Administered 2015-07-16 – 2015-07-19 (×12): 5000 [IU] via SUBCUTANEOUS
  Filled 2015-07-15 (×15): qty 1

## 2015-07-15 MED ORDER — ONDANSETRON 4 MG PO TBDP: 4.0000 mg | ORAL_TABLET | Freq: Four times a day (QID) | ORAL | Status: DC | PRN

## 2015-07-15 MED ORDER — SIMETHICONE 40 MG/0.6ML PO SUSP
40.0000 mg | Freq: Once | ORAL | Status: AC
  Administered 2015-07-15: 40 mg via ORAL
  Filled 2015-07-15: qty 0.6

## 2015-07-15 MED ORDER — POTASSIUM CHLORIDE IN NACL 20-0.45 MEQ/L-% IV SOLN
INTRAVENOUS | Status: DC
  Administered 2015-07-16 (×4): via INTRAVENOUS
  Administered 2015-07-17: 150 mL/h via INTRAVENOUS
  Administered 2015-07-17 – 2015-07-18 (×2): via INTRAVENOUS
  Administered 2015-07-18: 100 mL/h via INTRAVENOUS
  Administered 2015-07-18: 14:00:00 via INTRAVENOUS
  Filled 2015-07-15 (×15): qty 1000

## 2015-07-15 MED ORDER — PANTOPRAZOLE SODIUM 40 MG IV SOLR
40.0000 mg | Freq: Once | INTRAVENOUS | Status: AC
  Administered 2015-07-15: 40 mg via INTRAVENOUS
  Filled 2015-07-15: qty 40

## 2015-07-15 MED ORDER — ONDANSETRON HCL 4 MG/2ML IJ SOLN
4.0000 mg | Freq: Four times a day (QID) | INTRAMUSCULAR | Status: DC | PRN
  Administered 2015-07-17: 4 mg via INTRAVENOUS
  Filled 2015-07-15 (×2): qty 2

## 2015-07-15 MED ORDER — SODIUM CHLORIDE 0.9 % IV SOLN
INTRAVENOUS | Status: DC
  Administered 2015-07-15: 100 mL/h via INTRAVENOUS

## 2015-07-15 MED ORDER — SODIUM CHLORIDE 0.9 % IV BOLUS (SEPSIS)
500.0000 mL | Freq: Once | INTRAVENOUS | Status: AC
  Administered 2015-07-15: 500 mL via INTRAVENOUS

## 2015-07-15 MED ORDER — PANTOPRAZOLE SODIUM 40 MG IV SOLR
40.0000 mg | Freq: Every day | INTRAVENOUS | Status: DC
  Administered 2015-07-16 – 2015-07-17 (×3): 40 mg via INTRAVENOUS
  Filled 2015-07-15 (×5): qty 40

## 2015-07-15 NOTE — H&P (Signed)
Re:   Margaret Fletcher DOB:   06-06-1970 MRN:   [id number]   Admission  ASSESSMENT AND PLAN: 1.  Nausea, poor po intake, mild dehydration  Plan:  Admission, NPO, IVF, repeat labs and exam in AM.  Discussed with Dr. Redmond Pulling.  2.  S/P RYGB - 07/08/2015 - Dr. Redmond Pulling 3.  Morbid obesity  Weight - 273, BMI 41.57 4. Chronic migraine without aura or status migrainosus 5.  IIH (idiopathic intracranial hypertension) 6.  Mild obstructive sleep apnea  Chief Complaint  Patient presents with  . Abdominal Pain  . Post-op Problem   REFERRING PHYSICIAN: SHAW,Margaret Fletcher  HISTORY OF PRESENT ILLNESS: Margaret Fletcher is a 45 y.o. (DOB: 1971-03-01)  AA  female whose primary care physician is Margaret Fletcher and comes to the [facility] ER today for epigastric fullness and nausea.  Margaret Fletcher had a RYGB last week for morbid obesity.  She did well with her surgery and was discharged home on 07/10/2015.  She was tolerating liquids.  She tried some "solid" food over the weekend (string cheese), but started feeling full yesterday.  This got worse where she thought she could get nothing much down the last 24 hours.  She has not vomited.  She has some epigastric discomfort, but is not running a fever and is otherwise doing okay.  WBC - 07/15/2015 - 9,400 Creat - 07/15/2015 - 1.64   Past Medical History  Diagnosis Date  . Alopecia   . Autoimmune disease ([place])   . Bacterial vaginosis   . Frequent UTI   . Cyst of spleen   . Depression   . Hypertrophy of breast   . Depression   . Anxiety   . Migraine headache     denies, ruled out   . Migraine   . IIH (idiopathic intracranial hypertension) 2017      Past Surgical History  Procedure Laterality Date  . Cesarean section    . Cholecystectomy    . Appendectomy    . Cesarean section    . Wisdom tooth extraction    . Tubal ligation    . Dilitation & currettage/hystroscopy with novasure ablation N/A 07/19/2012    Procedure: HYSTEROSCOPY WITH NOVASURE ABLATION;   Surgeon: Farrel Gobble. Harrington Challenger, Fletcher;  Location: [facility];  Service: Gynecology;  Laterality: N/A;  . Endometrial ablation    . Breath tek h pylori N/A 05/21/2014    Procedure: BREATH TEK H PYLORI;  Surgeon: Greer Pickerel, Fletcher;  Location: [facility];  Service: General;  Laterality: N/A;  . Breast reduction surgery Bilateral 03/26/2015    Procedure: BILATERAL BREAST REDUCTION  ;  Surgeon: Youlanda Roys, Fletcher;  Location: [facility];  Service: Plastics;  Laterality: Bilateral;  . Laparoscopic roux-en-y gastric bypass with hiatal hernia repair N/A 07/08/2015    Procedure: LAPAROSCOPIC ROUX-EN-Y GASTRIC BYPASS WITH HIATAL HERNIA REPAIR WITH UPPER ENDOSCOPY;  Surgeon: Greer Pickerel, Fletcher;  Location: [facility];  Service: General;  Laterality: N/A;      Current Facility-Administered Medications  Medication Dose Route Frequency Provider Last Rate Last Dose  . 0.9 %  sodium chloride infusion   Intravenous Continuous Charlesetta Shanks, Fletcher 100 mL/hr at 07/15/15 1644 100 mL/hr at 07/15/15 1644   Current Outpatient Prescriptions  Medication Sig Dispense Refill  . Albuterol Sulfate (PROAIR RESPICLICK) 123XX123 (90 Base) MCG/ACT AEPB Inhale 2 puffs into the lungs every 6 (six) hours as needed. 1 each 3  . ALPRAZolam (XANAX) 0.5 MG tablet Take 1 tablet (0.5  mg total) by mouth 2 (two) times daily as needed for anxiety. Can take 1-2 30 minutes before MRI. Do not drive. 30 tablet 1  . buPROPion (WELLBUTRIN XL) 300 MG 24 hr tablet Take 1 tablet (300 mg total) by mouth daily. 90 tablet 3  . CALCIUM-VITAMIN D PO Take 15 mLs by mouth 3 (three) times daily.    . Cyanocobalamin (VITAMIN B 12 PO) Take 1 tablet by mouth daily.    Marland Kitchen oxyCODONE (ROXICODONE) 5 MG/5ML solution Take 5-10 mLs (5-10 mg total) by mouth every 4 (four) hours as needed for moderate pain or severe pain.  0  . Pediatric Multiple Vitamins (MULTIVITAMIN) LIQD Take 30 mLs by mouth 2 (two) times daily.    . SUMAtriptan (IMITREX) 100 MG tablet Take 1 tablet (100 mg  total) by mouth once as needed for migraine. May repeat in 2 hours if headache persists or recurs. 10 tablet 12  . topiramate (TOPAMAX) 100 MG tablet Take 1.5 tablets (150 mg total) by mouth 2 (two) times daily. 90 tablet 11      Allergies  Allergen Reactions  . Sulfa Antibiotics Anaphylaxis    REVIEW OF SYSTEMS: Skin:  No history of rash.  No history of abnormal moles. Infection:  No history of hepatitis or HIV.  No history of MRSA. Neurologic:  Chronic migraine without aura or status migrainosus.   IIH (idiopathic intracranial hypertension) Cardiac:  No history of hypertension. No history of heart disease.  No history of prior cardiac catheterization.  No history of seeing a cardiologist. Pulmonary:  Mild obstructive sleep apnea  Endocrine:  No diabetes. No thyroid disease. Gastrointestinal:  See HPI Urologic:  No history of kidney stones.  No history of bladder infections. Musculoskeletal:  No history of joint or back disease. Hematologic:  No bleeding disorder.  No history of anemia.  Not anticoagulated. Psycho-social:  The patient is oriented.   SOCIAL and FAMILY HISTORY: Works at [employer]: BP 117/80 mmHg  Pulse 69  Temp(Src) 98.9 F (37.2 C) (Oral)  Resp 18  SpO2 100%  LMP 06/24/2015  General: Obese AA F who is alert.  She does not look sick. HEENT: Normal. Pupils equal. Neck: Supple. No mass.  No thyroid mass. Lymph Nodes:  No supraclavicular or cervical nodes. Lungs: Clear to auscultation and symmetric breath sounds. Heart:  RRR. No murmur or rub. Abdomen: Soft.  No hernia. Few BS.  She is sore in her epigastrium, but she does not have peritoneal signs. She may be slightly full in her epigastrium. Extremities:  Good strength and ROM  in upper and lower extremities. Neurologic:  Grossly intact to motor and sensory function. Psychiatric: Has normal mood and affect. Behavior is normal.   DATA REVIEWED: Epic notes.  Alphonsa Overall, Fletcher,   [facility], [place]  [place], [place]    [place] Phone:  [phone] FAX:  [phone]

## 2015-07-15 NOTE — ED Provider Notes (Signed)
CSN: [id number]     Arrival date & time 07/15/15  34 History   First MD Initiated Contact with Patient 07/15/15 1536     Chief Complaint  Patient presents with  . Abdominal Pain  . Post-op Problem     (Consider location/radiation/quality/duration/timing/severity/associated sxs/prior Treatment) HPI Patient is status post bariatric surgery on 5\1. She has developed abdominal bloating and discomfort to started yesterday evening. She had initially been doing well on her fluid intake and soft diet as recommended. Symptoms yesterday evening began with feeling of fullness and bloating. She became quite intense in the epigastrium. Today she was not able to take and her fluids as instructed. No fever has developed. No vomiting. Epigastric discomfort and bloating and aching in nature. Or she is making bowel movements that are small volumes associated with gas passage. Past Medical History  Diagnosis Date  . Alopecia   . Autoimmune disease ([place])   . Bacterial vaginosis   . Frequent UTI   . Cyst of spleen   . Depression   . Hypertrophy of breast   . Depression   . Anxiety   . Migraine headache     denies, ruled out   . Migraine   . IIH (idiopathic intracranial hypertension) 2017   Past Surgical History  Procedure Laterality Date  . Cesarean section    . Cholecystectomy    . Appendectomy    . Cesarean section    . Wisdom tooth extraction    . Tubal ligation    . Dilitation & currettage/hystroscopy with novasure ablation N/A 07/19/2012    Procedure: HYSTEROSCOPY WITH NOVASURE ABLATION;  Surgeon: Farrel Gobble. Harrington Challenger, MD;  Location: [facility];  Service: Gynecology;  Laterality: N/A;  . Endometrial ablation    . Breath tek h pylori N/A 05/21/2014    Procedure: BREATH TEK H PYLORI;  Surgeon: Greer Pickerel, MD;  Location: [facility];  Service: General;  Laterality: N/A;  . Breast reduction surgery Bilateral 03/26/2015    Procedure: BILATERAL BREAST REDUCTION  ;  Surgeon: Youlanda Roys, MD;   Location: [facility];  Service: Plastics;  Laterality: Bilateral;  . Laparoscopic roux-en-y gastric bypass with hiatal hernia repair N/A 07/08/2015    Procedure: LAPAROSCOPIC ROUX-EN-Y GASTRIC BYPASS WITH HIATAL HERNIA REPAIR WITH UPPER ENDOSCOPY;  Surgeon: Greer Pickerel, MD;  Location: [facility];  Service: General;  Laterality: N/A;   Family History  Problem Relation Age of Onset  . Cancer Mother   . Stroke Father   . Diabetes Brother   . Seizures Son   . Migraines Neg Hx    Social History  Substance Use Topics  . Smoking status: Never Smoker   . Smokeless tobacco: Never Used  . Alcohol Use: Yes     Comment: socially   OB History    No data available     Review of Systems 10 Systems reviewed and are negative for acute change except as noted in the HPI.   Allergies  Sulfa antibiotics  Home Medications   Prior to Admission medications   Medication Sig Start Date End Date Taking? Authorizing Provider  Albuterol Sulfate (PROAIR RESPICLICK) 123XX123 (90 Base) MCG/ACT AEPB Inhale 2 puffs into the lungs every 6 (six) hours as needed. 07/04/15  Yes Robyn Haber, MD  ALPRAZolam Duanne Moron) 0.5 MG tablet Take 1 tablet (0.5 mg total) by mouth 2 (two) times daily as needed for anxiety. Can take 1-2 30 minutes before MRI. Do not drive. 07/04/15  Yes Robyn Haber, MD  buPROPion (WELLBUTRIN XL) 300 MG 24 hr tablet Take 1 tablet (300 mg total) by mouth daily. 07/04/15  Yes Robyn Haber, MD  CALCIUM-VITAMIN D PO Take 15 mLs by mouth 3 (three) times daily.   Yes Historical Provider, MD  Cyanocobalamin (VITAMIN B 12 PO) Take 1 tablet by mouth daily.   Yes Historical Provider, MD  oxyCODONE (ROXICODONE) 5 MG/5ML solution Take 5-10 mLs (5-10 mg total) by mouth every 4 (four) hours as needed for moderate pain or severe pain. 07/10/15  Yes Greer Pickerel, MD  Pediatric Multiple Vitamins (MULTIVITAMIN) LIQD Take 30 mLs by mouth 2 (two) times daily.   Yes Historical Provider, MD  SUMAtriptan  (IMITREX) 100 MG tablet Take 1 tablet (100 mg total) by mouth once as needed for migraine. May repeat in 2 hours if headache persists or recurs. 04/30/15  Yes Melvenia Beam, MD  topiramate (TOPAMAX) 100 MG tablet Take 1.5 tablets (150 mg total) by mouth 2 (two) times daily. 06/03/15  Yes Melvenia Beam, MD   BP 117/80 mmHg  Pulse 69  Temp(Src) 98.9 F (37.2 C) (Oral)  Resp 18  SpO2 100%  LMP 06/24/2015 Physical Exam  Constitutional: She is oriented to person, place, and time. She appears well-developed and well-nourished.  HENT:  Head: Normocephalic and atraumatic.  Eyes: EOM are normal. Pupils are equal, round, and reactive to light.  Neck: Neck supple.  Cardiovascular: Normal rate, regular rhythm, normal heart sounds and intact distal pulses.   Pulmonary/Chest: Effort normal and breath sounds normal.  Abdominal: Soft. Bowel sounds are normal. She exhibits no distension. There is tenderness.  Moderate epigastric tenderness without guarding. Bowel sounds present.  Musculoskeletal: Normal range of motion. She exhibits no edema or tenderness.  Neurological: She is alert and oriented to person, place, and time. She has normal strength. Coordination normal. GCS eye subscore is 4. GCS verbal subscore is 5. GCS motor subscore is 6.  Skin: Skin is warm, dry and intact.  Psychiatric: She has a normal mood and affect.    ED Course  Procedures (including critical care time) Labs Review Labs Reviewed  COMPREHENSIVE METABOLIC PANEL - Abnormal; Notable for the following:    CO2 20 (*)    BUN 23 (*)    Creatinine, Ser 1.64 (*)    GFR calc non Af Amer 37 (*)    GFR calc Af Amer 43 (*)    All other components within normal limits  URINALYSIS, ROUTINE W REFLEX MICROSCOPIC (NOT AT [facility]) - Abnormal; Notable for the following:    APPearance CLOUDY (*)    Hgb urine dipstick TRACE (*)    Ketones, ur 15 (*)    All other components within normal limits  URINE MICROSCOPIC-ADD ON - Abnormal; Notable  for the following:    Squamous Epithelial / LPF 0-5 (*)    Bacteria, UA FEW (*)    All other components within normal limits  CBC WITH DIFFERENTIAL/PLATELET  LIPASE, BLOOD    Imaging Review Dg Abd Acute W/chest  07/15/2015  CLINICAL DATA:  Abdominal pain, distention, and nausea and vomiting for past 2 days. One week postop gastric bypass surgery. EXAM: DG ABDOMEN ACUTE W/ 1V CHEST COMPARISON:  Upper GI series on 07/09/2015 FINDINGS: Anastomotic staples seen in the region of the stomach from gastric bypass surgery. There is no evidence of dilated bowel loops or free intraperitoneal air. Oral contrast material is seen throughout colon to the level the rectum. Pelvic phleboliths noted. Right upper quadrant surgical clips from  prior cholecystectomy. Heart size and mediastinal contours are within normal limits. Both lungs are clear. No evidence of pneumothorax or pleural effusion. IMPRESSION: Unremarkable bowel gas pattern.  No acute findings. No active cardiopulmonary disease. Electronically Signed   By: Earle Gell M.D.   On: 07/15/2015 17:28   I have personally reviewed and evaluated these images and lab results as part of my medical decision-making.   EKG Interpretation None     Consult: He is case was reviewed with Dr. Redmond Pulling, the patient's primary surgeon. He did advise to provide fluid resuscitation and give simethicone. He also was going to advise Dr.Newman of the patient's evaluation emergency department. Consult: Patient's case was reviewed with Dr. Lucia Gaskins. He will evaluate the patient emergency department to determine ongoing management. MDM   Final diagnoses:  Postoperative pain  Status post bariatric surgery   Patient's alert and nontoxic. Vital signs are stable. Hydration has been initiated. Final disposition will be pending consultation by Dr. Lucia Gaskins.    Charlesetta Shanks, MD 07/15/15 [phone]

## 2015-07-15 NOTE — ED Notes (Signed)
Patient has abd pain after drinking anything that started yesterday.  Patient had berriatric and hernia surgery on May 1st.  Patient has been unable to get her required nutrition count in today due to pain "heavy feeling in mid abd".

## 2015-07-16 LAB — CBC
HEMATOCRIT: 35.3 % — AB (ref 36.0–46.0)
HEMOGLOBIN: 11.8 g/dL — AB (ref 12.0–15.0)
MCH: 29.9 pg (ref 26.0–34.0)
MCHC: 33.4 g/dL (ref 30.0–36.0)
MCV: 89.4 fL (ref 78.0–100.0)
Platelets: 325 10*3/uL (ref 150–400)
RBC: 3.95 MIL/uL (ref 3.87–5.11)
RDW: 14.6 % (ref 11.5–15.5)
WBC: 9 10*3/uL (ref 4.0–10.5)

## 2015-07-16 LAB — BASIC METABOLIC PANEL
ANION GAP: 8 (ref 5–15)
BUN: 20 mg/dL (ref 6–20)
CALCIUM: 8.3 mg/dL — AB (ref 8.9–10.3)
CO2: 18 mmol/L — ABNORMAL LOW (ref 22–32)
Chloride: 115 mmol/L — ABNORMAL HIGH (ref 101–111)
Creatinine, Ser: 1.3 mg/dL — ABNORMAL HIGH (ref 0.44–1.00)
GFR, EST AFRICAN AMERICAN: 57 mL/min — AB (ref >60)
GFR, EST NON AFRICAN AMERICAN: 49 mL/min — AB (ref >60)
GLUCOSE: 78 mg/dL (ref 65–99)
POTASSIUM: 4 mmol/L (ref 3.5–5.1)
Sodium: 141 mmol/L (ref 135–145)

## 2015-07-16 MED ORDER — LIP MEDEX EX OINT
TOPICAL_OINTMENT | CUTANEOUS | Status: AC
  Administered 2015-07-16: 1
  Filled 2015-07-16: qty 7

## 2015-07-16 MED ORDER — SUCRALFATE 1 GM/10ML PO SUSP
1.0000 g | Freq: Three times a day (TID) | ORAL | Status: DC
  Administered 2015-07-16 – 2015-07-19 (×12): 1 g via ORAL
  Filled 2015-07-16 (×17): qty 10

## 2015-07-16 MED ORDER — TOPIRAMATE 25 MG PO TABS
150.0000 mg | ORAL_TABLET | Freq: Two times a day (BID) | ORAL | Status: DC
  Administered 2015-07-16 – 2015-07-19 (×6): 150 mg via ORAL
  Filled 2015-07-16 (×8): qty 2

## 2015-07-16 NOTE — Progress Notes (Signed)
  Subjective: Events noted. Feels better. No n/v. No epigastric pain. overweekend after having string cheese, had epigastric fullness, bloating. Felt full with even a few sips of water. No shoulder pain.   Objective: Vital signs in last 24 hours: Temp:  [98.1 F (36.7 C)-99.1 F (37.3 C)] 98.1 F (36.7 C) (05/09 1018) Pulse Rate:  [54-86] 57 (05/09 1018) Resp:  [18] 18 (05/09 1018) BP: (99-141)/(63-93) 109/67 mmHg (05/09 1018) SpO2:  [99 %-100 %] 100 % (05/09 1018) Weight:  [117.845 kg (259 lb 12.8 oz)] 117.845 kg (259 lb 12.8 oz) (05/08 2258) Last BM Date: 07/15/15  Intake/Output from previous day: 05/08 0701 - 05/09 0700 In: -  Out: 400 [Urine:400] Intake/Output this shift: Total I/O In: 60 [P.O.:60] Out: 0   Looks very comfortable. Not ill appearing cta  Reg Soft, nt, incisions c/d/i No edema  Lab Results:   Recent Labs  07/15/15 1627 07/16/15 0433  WBC 9.4 9.0  HGB 13.9 11.8*  HCT 39.7 35.3*  PLT 362 325   BMET  Recent Labs  07/15/15 1627 07/16/15 0433  NA 139 141  K 3.8 4.0  CL 108 115*  CO2 20* 18*  GLUCOSE 86 78  BUN 23* 20  CREATININE 1.64* 1.30*  CALCIUM 9.3 8.3*   PT/INR No results for input(s): LABPROT, INR in the last 72 hours. ABG No results for input(s): PHART, HCO3 in the last 72 hours.  Invalid input(s): PCO2, PO2  Studies/Results: Dg Abd Acute W/chest  07/15/2015  CLINICAL DATA:  Abdominal pain, distention, and nausea and vomiting for past 2 days. One week postop gastric bypass surgery. EXAM: DG ABDOMEN ACUTE W/ 1V CHEST COMPARISON:  Upper GI series on 07/09/2015 FINDINGS: Anastomotic staples seen in the region of the stomach from gastric bypass surgery. There is no evidence of dilated bowel loops or free intraperitoneal air. Oral contrast material is seen throughout colon to the level the rectum. Pelvic phleboliths noted. Right upper quadrant surgical clips from prior cholecystectomy. Heart size and mediastinal contours are within  normal limits. Both lungs are clear. No evidence of pneumothorax or pleural effusion. IMPRESSION: Unremarkable bowel gas pattern.  No acute findings. No active cardiopulmonary disease. Electronically Signed   By: Earle Gell M.D.   On: 07/15/2015 17:28    Anti-infectives: Anti-infectives    None      Assessment/Plan: Dehydration S/p LRYGB  No clinical signs of leak - no fever, no tachycardia, normal wbc, nontender. . Therefore will hold off on CT. Believe she may have some anastomotic edema or pouch irritation perhaps. Will start with water and see how she does. May add carafate, if tolerates water, will adv to protein shakes.   Cont chemical vte prophylaxis Repeat labs in am ambulate  Leighton Ruff. Redmond Pulling, MD, FACS General, Bariatric, & Minimally Invasive Surgery [facility], [place]      [facility] Jerilynn Mages 07/16/2015

## 2015-07-16 NOTE — Progress Notes (Signed)
Patient concerned about missed doses of Topamax she takes scheduled at home for headaches due to  IIH.  Patient states " I missed 3 doses & I currently have a headache". MD paged, gave order to give Topamax as scheduled at home 150mg  BID with sips of water. Will continue to monitor patient.

## 2015-07-17 ENCOUNTER — Observation Stay (HOSPITAL_COMMUNITY): Payer: 59

## 2015-07-17 LAB — BASIC METABOLIC PANEL
Anion gap: 7 (ref 5–15)
BUN: 12 mg/dL (ref 6–20)
CO2: 19 mmol/L — ABNORMAL LOW (ref 22–32)
Calcium: 8.3 mg/dL — ABNORMAL LOW (ref 8.9–10.3)
Chloride: 114 mmol/L — ABNORMAL HIGH (ref 101–111)
Creatinine, Ser: 1.32 mg/dL — ABNORMAL HIGH (ref 0.44–1.00)
GFR calc Af Amer: 55 mL/min — ABNORMAL LOW (ref >60)
GFR, EST NON AFRICAN AMERICAN: 48 mL/min — AB (ref >60)
GLUCOSE: 76 mg/dL (ref 65–99)
POTASSIUM: 4.3 mmol/L (ref 3.5–5.1)
Sodium: 140 mmol/L (ref 135–145)

## 2015-07-17 LAB — CBC
HEMATOCRIT: 35.5 % — AB (ref 36.0–46.0)
Hemoglobin: 11.7 g/dL — ABNORMAL LOW (ref 12.0–15.0)
MCH: 30.1 pg (ref 26.0–34.0)
MCHC: 33 g/dL (ref 30.0–36.0)
MCV: 91.3 fL (ref 78.0–100.0)
Platelets: 300 10*3/uL (ref 150–400)
RBC: 3.89 MIL/uL (ref 3.87–5.11)
RDW: 14.9 % (ref 11.5–15.5)
WBC: 7.5 10*3/uL (ref 4.0–10.5)

## 2015-07-17 MED ORDER — OXYCODONE HCL 5 MG/5ML PO SOLN
5.0000 mg | ORAL | Status: DC | PRN
  Administered 2015-07-17 – 2015-07-18 (×2): 5 mg via ORAL
  Filled 2015-07-17 (×2): qty 5

## 2015-07-17 MED ORDER — PREMIER PROTEIN SHAKE
2.0000 [oz_av] | Freq: Four times a day (QID) | ORAL | Status: DC
  Administered 2015-07-17 – 2015-07-19 (×8): 2 [oz_av] via ORAL

## 2015-07-17 MED ORDER — DIATRIZOATE MEGLUMINE & SODIUM 66-10 % PO SOLN
60.0000 mL | Freq: Once | ORAL | Status: AC
  Administered 2015-07-17: 60 mL via ORAL

## 2015-07-17 NOTE — Progress Notes (Signed)
  Subjective: No issues at rest and when not trying to drink. Took about 4 medicine cups of water throughout day. Within a few seconds of drinking, would have epigastric pain, about 5/10, feels very full, feels like fluid emptying pouch slowly then slowly goes away.   Objective: Vital signs in last 24 hours: Temp:  [98.1 F (36.7 C)-99.4 F (37.4 C)] 99.3 F (37.4 C) (05/10 0542) Pulse Rate:  [57-71] 71 (05/10 0542) Resp:  [16-18] 16 (05/10 0542) BP: (107-119)/(51-77) 111/51 mmHg (05/10 0542) SpO2:  [100 %] 100 % (05/10 0542) Weight:  [118.933 kg (262 lb 3.2 oz)] 118.933 kg (262 lb 3.2 oz) (05/09 2231) Last BM Date: 07/15/15  Intake/Output from previous day: 05/09 0701 - 05/10 0700 In: 1860 [P.O.:60; I.V.:1800] Out: 2300 [Urine:2300] Intake/Output this shift:    Alert, nontoxic, not ill appearing cta Reg Soft, nt, nd, incisions c/d/i No edema  Lab Results:   Recent Labs  07/16/15 0433 07/17/15 0453  WBC 9.0 7.5  HGB 11.8* 11.7*  HCT 35.3* 35.5*  PLT 325 300   BMET  Recent Labs  07/16/15 0433 07/17/15 0453  NA 141 140  K 4.0 4.3  CL 115* 114*  CO2 18* 19*  GLUCOSE 78 76  BUN 20 12  CREATININE 1.30* 1.32*  CALCIUM 8.3* 8.3*   PT/INR No results for input(s): LABPROT, INR in the last 72 hours. ABG No results for input(s): PHART, HCO3 in the last 72 hours.  Invalid input(s): PCO2, PO2  Studies/Results: Dg Abd Acute W/chest  07/15/2015  CLINICAL DATA:  Abdominal pain, distention, and nausea and vomiting for past 2 days. One week postop gastric bypass surgery. EXAM: DG ABDOMEN ACUTE W/ 1V CHEST COMPARISON:  Upper GI series on 07/09/2015 FINDINGS: Anastomotic staples seen in the region of the stomach from gastric bypass surgery. There is no evidence of dilated bowel loops or free intraperitoneal air. Oral contrast material is seen throughout colon to the level the rectum. Pelvic phleboliths noted. Right upper quadrant surgical clips from prior cholecystectomy.  Heart size and mediastinal contours are within normal limits. Both lungs are clear. No evidence of pneumothorax or pleural effusion. IMPRESSION: Unremarkable bowel gas pattern.  No acute findings. No active cardiopulmonary disease. Electronically Signed   By: Earle Gell M.D.   On: 07/15/2015 17:28    Anti-infectives: Anti-infectives    None      Assessment/Plan: S/p LRYGB Epigastric discomfort with PO Symptoms suggestive of an ulcer however would very early to have an ulcer. Perhaps more suggestive of mucosal irritation/inflammation. Will get an UGI to make sure liquid emptying pouch. Pt concerned about discomfort after UGI - when I drink it feels just like how I felt immediately after my post UGI. I explained I would rather start with UGI which would be least invasive compared to EGD. will premedicate with pain/nause meds  C/o that topamax is very bitter when crushed with water. Will allow her to try take topamax with shake  Decrease IVF Start PO pain meds  Leighton Ruff. Redmond Pulling, MD, FACS General, Bariatric, & Minimally Invasive Surgery [facility], [place]       [facility] Jerilynn Mages 07/17/2015

## 2015-07-18 DIAGNOSIS — E860 Dehydration: Secondary | ICD-10-CM | POA: Diagnosis present

## 2015-07-18 DIAGNOSIS — K289 Gastrojejunal ulcer, unspecified as acute or chronic, without hemorrhage or perforation: Secondary | ICD-10-CM | POA: Diagnosis present

## 2015-07-18 DIAGNOSIS — Z882 Allergy status to sulfonamides status: Secondary | ICD-10-CM | POA: Diagnosis not present

## 2015-07-18 DIAGNOSIS — Z79899 Other long term (current) drug therapy: Secondary | ICD-10-CM | POA: Diagnosis not present

## 2015-07-18 DIAGNOSIS — Z9884 Bariatric surgery status: Secondary | ICD-10-CM | POA: Diagnosis not present

## 2015-07-18 DIAGNOSIS — G932 Benign intracranial hypertension: Secondary | ICD-10-CM | POA: Diagnosis present

## 2015-07-18 DIAGNOSIS — Z9049 Acquired absence of other specified parts of digestive tract: Secondary | ICD-10-CM | POA: Diagnosis not present

## 2015-07-18 DIAGNOSIS — G4733 Obstructive sleep apnea (adult) (pediatric): Secondary | ICD-10-CM | POA: Diagnosis present

## 2015-07-18 DIAGNOSIS — Z6841 Body Mass Index (BMI) 40.0 and over, adult: Secondary | ICD-10-CM | POA: Diagnosis not present

## 2015-07-18 DIAGNOSIS — R1013 Epigastric pain: Secondary | ICD-10-CM | POA: Diagnosis present

## 2015-07-18 DIAGNOSIS — G43909 Migraine, unspecified, not intractable, without status migrainosus: Secondary | ICD-10-CM | POA: Diagnosis present

## 2015-07-18 MED ORDER — SODIUM CHLORIDE 0.9% FLUSH: 10.0000 mL | INTRAVENOUS | Status: DC | PRN

## 2015-07-18 MED ORDER — LORAZEPAM 2 MG/ML IJ SOLN
1.0000 mg | Freq: Once | INTRAMUSCULAR | Status: AC
  Administered 2015-07-18: 1 mg via INTRAVENOUS

## 2015-07-18 MED ORDER — OXYCODONE HCL 5 MG/5ML PO SOLN
2.5000 mg | ORAL | Status: DC | PRN
  Administered 2015-07-18: 5 mg via ORAL
  Filled 2015-07-18: qty 5

## 2015-07-18 MED ORDER — PANTOPRAZOLE SODIUM 40 MG IV SOLR
40.0000 mg | Freq: Two times a day (BID) | INTRAVENOUS | Status: DC
  Administered 2015-07-18 – 2015-07-19 (×3): 40 mg via INTRAVENOUS
  Filled 2015-07-18 (×4): qty 40

## 2015-07-18 MED ORDER — LORAZEPAM 2 MG/ML IJ SOLN
0.5000 mg | Freq: Once | INTRAMUSCULAR | Status: AC
  Administered 2015-07-18: 0.5 mg via INTRAVENOUS
  Filled 2015-07-18: qty 1

## 2015-07-18 NOTE — Progress Notes (Signed)
Peripherally Inserted Central Catheter/Midline Placement  The IV Nurse has discussed with the patient and/or persons authorized to consent for the patient, the purpose of this procedure and the potential benefits and risks involved with this procedure.  The benefits include less needle sticks, lab draws from the catheter and patient may be discharged home with the catheter.  Risks include, but not limited to, infection, bleeding, blood clot (thrombus formation), and puncture of an artery; nerve damage and irregular heat beat.  Alternatives to this procedure were also discussed.  PICC/Midline Placement Documentation        Henderson [place] 07/18/2015, 12:41 PM Consent obtained by Jake Samples, RN

## 2015-07-18 NOTE — Progress Notes (Signed)
Subjective: Yesterday not good as expected bc of UGI - UGI reproduced a lot of her symptoms - epigastric discomfort and then she had severe migraine in pm. Did better overnight. No n/v. Took in 1/2 shake and some water. Ambulated.   Objective: Vital signs in last 24 hours: Temp:  [98.2 F (36.8 C)-99.7 F (37.6 C)] 99.7 F (37.6 C) (05/11 0549) Pulse Rate:  [62-82] 82 (05/11 0549) Resp:  [16-17] 17 (05/11 0549) BP: (100-113)/(55-63) 103/61 mmHg (05/11 0549) SpO2:  [98 %-100 %] 100 % (05/11 0549) Weight:  [118.162 kg (260 lb 8 oz)] 118.162 kg (260 lb 8 oz) (05/11 0645) Last BM Date: 07/18/15  Intake/Output from previous day: 05/10 0701 - 05/11 0700 In: 2201.7 [I.V.:2201.7] Out: 2250 [Urine:2250] Intake/Output this shift:    Resting comfortably, not ill appearing Soft, nt, nd, incisions c/d/i No edema  Lab Results:   Recent Labs  07/16/15 0433 07/17/15 0453  WBC 9.0 7.5  HGB 11.8* 11.7*  HCT 35.3* 35.5*  PLT 325 300   BMET  Recent Labs  07/16/15 0433 07/17/15 0453  NA 141 140  K 4.0 4.3  CL 115* 114*  CO2 18* 19*  GLUCOSE 78 76  BUN 20 12  CREATININE 1.30* 1.32*  CALCIUM 8.3* 8.3*   PT/INR No results for input(s): LABPROT, INR in the last 72 hours. ABG No results for input(s): PHART, HCO3 in the last 72 hours.  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 EXAM: WATER SOLUBLE UPPER GI SERIES TECHNIQUE: Single-column upper GI series was performed using water soluble contrast. COMPARISON:  Multiple exams, including 07/09/2015 FLUOROSCOPY TIME:  Radiation Exposure Index (as provided by the fluoroscopic device): 155 microGy*m^2 FINDINGS: Initial KUB demonstrates residual contrast medium in the colon, suggesting delayed transit. Gastric pouch normal with prompt emptying into the jejunum via the gastrojejunostomy. Despite the prompt  emptying, the patient indicated discomfort, even after the pouch head fully emptied into the jejunum. There is no leak or complicating feature visible. Contrast was followed fairly distally in the jejunum, presumably past the distal anastomosis, and was not sluggish. On some images, such as image 14 series 8, there is some lobulation of the left lateral margin of the anastomosis which is slightly more exaggerated than on the immediate postoperative images of 07/09/2015. This seems very early in the course for the possibility of a marginal ulcer and I suspect that this represents incidental rugal fold or simply a localized mucosal edema rather than ulceration. IMPRESSION: 1. There still contrast medium in the colon presumably from the upper GI exam from 8 days ago. This would suggest ileus, although no dilated bowel is visible. 2. The gastric pouch and the gastrojejunostomy demonstrate no evidence of leak or breakdown. The patient did experience discomfort despite the prompt emptying of the gastric pouch. 3. There is slight mucosal lobularity along the left side in the vicinity of the anastomosis. I suspect this is due to localized mucosal edema. This seems very early in the postoperative course to consider the possibility of a marginal ulceration. However, if unexplained discomfort persists, endoscopy may be warranted. Electronically Signed   By: Van Clines M.D.   On: 07/17/2015 09:28    Anti-infectives: Anti-infectives    None      Assessment/Plan: S/p LRYGB Postprandial discomfort  UGI reviewed and discussed with pt. No leak/obstruction. Seems like she is slowly improving. Discussed pros/cons  of EGD. While I think an EGD would confirm our suspicion of some mucosal edema/irritation/disruption causing the pain/discomfort, I do think it will change my therapy for it - bid protonix, scheduled carafate with meals.   Therefore she will continue to push liquids only and we will try to premedicate  oral intake with carafate and some LOW dose oxycodone. In interim will set her up for PICC line for intermittent IVF boluses and possilble TPN (if her PO continues to struggle) at home so we can get her out of the hospital.   Discussed risks of PICC - infection, blood clots. Pt wants to go ahead with PICC line.   Leighton Ruff. Redmond Pulling, MD, FACS General, Bariatric, & Minimally Invasive Surgery [facility], [place]       [facility] Jerilynn Mages 07/18/2015

## 2015-07-19 ENCOUNTER — Telehealth (HOSPITAL_COMMUNITY): Payer: Self-pay

## 2015-07-19 MED ORDER — OXYCODONE HCL 5 MG/5ML PO SOLN: 2.5000 mg | ORAL | Status: DC | PRN

## 2015-07-19 MED ORDER — HEPARIN SOD (PORK) LOCK FLUSH 100 UNIT/ML IV SOLN
500.0000 [IU] | Freq: Once | INTRAVENOUS | Status: AC
Start: 2015-07-19 — End: 2015-07-19
  Administered 2015-07-19: 500 [IU] via INTRAVENOUS
  Filled 2015-07-19: qty 5

## 2015-07-19 MED ORDER — ONDANSETRON 4 MG PO TBDP: 4.0000 mg | ORAL_TABLET | Freq: Four times a day (QID) | ORAL | Status: DC | PRN

## 2015-07-19 MED ORDER — SUCRALFATE 1 GM/10ML PO SUSP: 1.0000 g | Freq: Three times a day (TID) | ORAL | Status: DC

## 2015-07-19 MED ORDER — THIAMINE HCL 100 MG/ML IJ SOLN: 1000.0000 mL | Freq: Once | INTRAVENOUS | Status: DC

## 2015-07-19 MED ORDER — LACTATED RINGERS IV BOLUS (SEPSIS)
1000.0000 mL | Freq: Once | INTRAVENOUS | Status: AC
  Administered 2015-07-19: 1000 mL via INTRAVENOUS

## 2015-07-19 MED ORDER — LIP MEDEX EX OINT
TOPICAL_OINTMENT | CUTANEOUS | Status: AC
  Administered 2015-07-19
  Filled 2015-07-19: qty 7

## 2015-07-19 MED FILL — oxyCODONE HCL 5 MG/5ML SOLN: 5 | 8 days supply | Qty: 240 | Fill #0

## 2015-07-19 MED FILL — ONDANSETRON ODT 4 MG TABLET: 4 | 7 days supply | Qty: 30 | Fill #0

## 2015-07-19 NOTE — Progress Notes (Signed)
Patient alert and oriented, and emotional.  Provided support and encouragement.  Encouraged ambulation and small sips of liquids.  Discussed possibility of attending afternoon support group today, patient was agreeable.  MD made aware and orders received.  All questions answered.  Will continue to monitor.

## 2015-07-19 NOTE — Care Management Note (Signed)
Case Management Note  Patient Details  Name: Margaret Fletcher MRN: [id number] Date of Birth: 23-Nov-1970  Subjective/Objective:    readmitted with epigastric pain with oral intake, s/p gastric bypass 5/1                Action/Plan: Discharge planning, spoke with patient at bedside. Discussed need for [facility] services for continued IVF, possible TNA. Patient had several choices for [facility] but none that could provide needed services, continued to discuss with patient yesterday afternoon, patient continued to review [facility] list provided. Received a call late yesterday that patient had decided on [facility], contacted [facility] to provide referral, they plan to f/u with patient today. PICC in place.   Expected Discharge Date:  07/18/15               Expected Discharge Plan:  [place]  In-House Referral:  NA  Discharge planning Services  CM Consult  Post Acute Care Choice:  Home Health Choice offered to:  Patient  DME Arranged:  N/A DME Agency:  NA  HH Arranged:  RN, Disease Management [place] Agency:  [place]  Status of Service:  Completed, signed off  Medicare Important Message Given:    Date Medicare IM Given:    Medicare IM give by:    Date Additional Medicare IM Given:    Additional Medicare Important Message give by:     If discussed at [place] of Stay Meetings, dates discussed:    Additional Comments:  Guadalupe Maple, RN 07/19/2015, 8:37 AM

## 2015-07-19 NOTE — Discharge Summary (Signed)
Physician Discharge Summary  Margaret Fletcher [id number] DOB: 1970/05/07 DOA: 07/15/2015  PCP: [name], MD  Admit date: 07/15/2015 Discharge date: 07/19/2015  Recommendations for Outpatient Follow-up:   Follow-up Information    Follow up with [name], MD In 1 week.   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 She had been doing well until about 48 hours prior to readmission. She had tried some solid food and then developed epigastric discomfort and fullness. She states that it felt like her pouch was constantly full. She started having difficulty tolerating liquids. She would develop epigastric discomfort that she would rate as a 7 out of 10 within 30 seconds of drinking. She came to the emergency room and was admitted for IV fluids due to dehydration. Her laboratory values were essentially within normal limits. She was afebrile, non-tachycardic with a normal white blood cell count. There is no clinical evidence of a leak. She was started on Protonix and ultimately started on Carafate. Her symptoms were consistent with a marginal ulcer. Her pain gradually improved but she was still having difficulty meeting oral intake requirements. An upper GI was performed which demonstrated prompt emptying of the pouch with no evidence of a leak. She remained completely afebrile with no evidence of tachycardia throughout her  hospitalization. We started her on Carafate and low-dose when necessary pain medicine to premedicate prior to oral intake. A PICC line was placed in anticipation of sending her home for when necessary IV fluid in case she wasn't meeting oral intake and off. On day of discharge she was tolerating liquids. Her pain was about a 3 out of 10 with liquid intake. She was angling without difficulty. I felt she was stable. She was clearly instructed to stay on a liquid diet and no solids. We will arrange home health nursing for her to get IV fluids at least 3 nights a week. She will also go home on Protonix as well as Carafate. She was instructed on what to call for   Discharge Instructions      Discharge Instructions    Ambulate hourly while awake    Complete by:  As directed      Call MD for:  difficulty breathing, headache or visual disturbances    Complete by:  As directed      Call MD for:  persistant dizziness or light-headedness    Complete by:  As directed      Call MD for:  persistant nausea and vomiting    Complete by:  As directed      Call MD for:  redness, tenderness, or signs of infection (pain, swelling, redness, odor or green/yellow discharge around incision site)    Complete by:  As directed      Call MD for:  severe uncontrolled pain    Complete by:  As directed      Call MD for:  temperature >  101 F    Complete by:  As directed      Diet bariatric full liquid    Complete by:  As directed      Discharge instructions    Complete by:  As directed   See bariatric discharge instructions     Incentive spirometry    Complete by:  As directed   Perform hourly while awake            Medication List    TAKE these medications        Albuterol Sulfate 108 (90 Base) MCG/ACT Aepb  Commonly known as:  PROAIR RESPICLICK  Inhale 2 puffs into the lungs every 6 (six) hours as needed.     ALPRAZolam 0.5 MG tablet  Commonly known as:  XANAX  Take 1 tablet (0.5 mg total) by mouth 2  (two) times daily as needed for anxiety. Can take 1-2 30 minutes before MRI. Do not drive.     buPROPion 300 MG 24 hr tablet  Commonly known as:  WELLBUTRIN XL  Take 1 tablet (300 mg total) by mouth daily.     CALCIUM-VITAMIN D PO  Take 15 mLs by mouth 3 (three) times daily.     multivitamin Liqd  Take 30 mLs by mouth 2 (two) times daily.     ondansetron 4 MG disintegrating tablet  Commonly known as:  ZOFRAN-ODT  Take 1 tablet (4 mg total) by mouth every 6 (six) hours as needed for nausea.     oxyCODONE 5 MG/5ML solution  Commonly known as:  ROXICODONE  Take 2.5 mLs (2.5 mg total) by mouth every 4 (four) hours as needed for moderate pain or severe pain (before meals as needed for pain).     sucralfate 1 GM/10ML suspension  Commonly known as:  CARAFATE  Take 10 mLs (1 g total) by mouth 4 (four) times daily -  with meals and at bedtime.     SUMAtriptan 100 MG tablet  Commonly known as:  IMITREX  Take 1 tablet (100 mg total) by mouth once as needed for migraine. May repeat in 2 hours if headache persists or recurs.     thiamine 123XX123 mg, folic acid 1 mg, multivitamins adult 10 mL in sodium chloride 0.9 % 1,000 mL  Inject 1,000 mLs into the vein once. 1L IV every 3 days; first dose Sunday     topiramate 100 MG tablet  Commonly known as:  TOPAMAX  Take 1.5 tablets (150 mg total) by mouth 2 (two) times daily.     VITAMIN B 12 PO  Take 1 tablet by mouth daily.       Follow-up Information    Follow up with [name], MD In 1 week.   Specialty:  General Surgery   Why:  For wound re-check   Contact information:   [name] [id number] [phone]        The results of significant diagnostics from this hospitalization (including imaging, microbiology, ancillary and laboratory) are listed below for reference.    Significant Diagnostic Studies: Dg Abd Acute W/chest  07/15/2015  CLINICAL DATA:  Abdominal pain, distention, and nausea and vomiting  for past 2 days. One week postop gastric bypass surgery. EXAM: DG ABDOMEN ACUTE W/ 1V CHEST COMPARISON:  Upper GI series on 07/09/2015 FINDINGS: Anastomotic staples seen in the region of the stomach from gastric bypass surgery. There is no evidence of dilated bowel loops or free intraperitoneal air. Oral contrast material  is seen throughout colon to the level the rectum. Pelvic phleboliths noted. Right upper quadrant surgical clips from prior cholecystectomy. Heart size and mediastinal contours are within normal limits. Both lungs are clear. No evidence of pneumothorax or pleural effusion. IMPRESSION: Unremarkable bowel gas pattern.  No acute findings. No active cardiopulmonary disease. Electronically Signed   By: Earle Gell M.D.   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 EXAM: WATER SOLUBLE UPPER GI SERIES TECHNIQUE: Single-column upper GI series was performed using water soluble contrast. COMPARISON:  Multiple exams, including 07/09/2015 FLUOROSCOPY TIME:  Radiation Exposure Index (as provided by the fluoroscopic device): 155 microGy*m^2 FINDINGS: Initial KUB demonstrates residual contrast medium in the colon, suggesting delayed transit. Gastric pouch normal with prompt emptying into the jejunum via the gastrojejunostomy. Despite the prompt emptying, the patient indicated discomfort, even after the pouch head fully emptied into the jejunum. There is no leak or complicating feature visible. Contrast was followed fairly distally in the jejunum, presumably past the distal anastomosis, and was not sluggish. On some images, such as image 14 series 8, there is some lobulation of the left lateral margin of the anastomosis which is slightly more exaggerated than on the immediate postoperative images of 07/09/2015. This seems very early in the course for the possibility of a marginal ulcer and I suspect that this  represents incidental rugal fold or simply a localized mucosal edema rather than ulceration. IMPRESSION: 1. There still contrast medium in the colon presumably from the upper GI exam from 8 days ago. This would suggest ileus, although no dilated bowel is visible. 2. The gastric pouch and the gastrojejunostomy demonstrate no evidence of leak or breakdown. The patient did experience discomfort despite the prompt emptying of the gastric pouch. 3. There is slight mucosal lobularity along the left side in the vicinity of the anastomosis. I suspect this is due to localized mucosal edema. This seems very early in the postoperative course to consider the possibility of a marginal ulceration. However, if unexplained discomfort persists, endoscopy may be warranted. Electronically Signed   By: Van Clines M.D.   On: 07/17/2015 09:28   Dg Duanne Limerick W/water Sol Cm  07/09/2015  CLINICAL DATA:  Status post gastric bypass surgery. EXAM: WATER SOLUBLE UPPER GI SERIES TECHNIQUE: Single-column upper GI series was performed using water soluble contrast. CONTRAST:  50 cc of water-soluble contrast material peer COMPARISON:  None. FLUOROSCOPY TIME:  Radiation Exposure Index (as provided by the fluoroscopic device): 88.9 mGy If the device does not provide the exposure index: Fluoroscopy Time (in minutes and seconds):  24 seconds Number of Acquired Images: FINDINGS: Ingestion of water-soluble contrast material opacifies the upper GI tract. Anatomy compatible with Roux-en-Y gastric bypass surgery is identified. There is normal antegrade progression of the enteric contrast material through the gastric remnant and into the proximal jejunum. No extravasation of contrast material identified to suggest leakage. IMPRESSION: 1. No complications status post Roux-en-Y gastric bypass surgery. Electronically Signed   By: Kerby Moors M.D.   On: 07/09/2015 10:47    Labs: Basic Metabolic Panel:  Recent Labs Lab 07/15/15 1627 07/16/15 0433  07/17/15 0453  NA 139 141 140  K 3.8 4.0 4.3  CL 108 115* 114*  CO2 20* 18* 19*  GLUCOSE 86 78 76  BUN 23* 20 12  CREATININE 1.64* 1.30* 1.32*  CALCIUM 9.3 8.3* 8.3*   Liver Function Tests:  Recent Labs Lab 07/15/15 1627  AST 26  ALT 40  ALKPHOS 62  BILITOT 0.5  PROT 7.5  ALBUMIN 3.8    CBC:  Recent Labs Lab 07/15/15 1627 07/16/15 0433 07/17/15 0453  WBC 9.4 9.0 7.5  NEUTROABS 6.8  --   --   HGB 13.9 11.8* 11.7*  HCT 39.7 35.3* 35.5*  MCV 87.3 89.4 91.3  PLT 362 325 300    CBG: No results for input(s): GLUCAP in the last 168 hours.  Active Problems:   Dehydration   Time coordinating discharge: 30 minutes  Signed:  [name], MD [facility], [place] [phone] 07/19/2015, 8:31 AM

## 2015-07-19 NOTE — Discharge Instructions (Signed)

## 2015-07-20 DIAGNOSIS — Z452 Encounter for adjustment and management of vascular access device: Secondary | ICD-10-CM | POA: Diagnosis not present

## 2015-07-20 DIAGNOSIS — Z7951 Long term (current) use of inhaled steroids: Secondary | ICD-10-CM | POA: Diagnosis not present

## 2015-07-20 DIAGNOSIS — Z7901 Long term (current) use of anticoagulants: Secondary | ICD-10-CM | POA: Diagnosis not present

## 2015-07-20 DIAGNOSIS — E860 Dehydration: Secondary | ICD-10-CM | POA: Diagnosis not present

## 2015-07-21 DIAGNOSIS — Z7901 Long term (current) use of anticoagulants: Secondary | ICD-10-CM | POA: Diagnosis not present

## 2015-07-21 DIAGNOSIS — Z7951 Long term (current) use of inhaled steroids: Secondary | ICD-10-CM | POA: Diagnosis not present

## 2015-07-21 DIAGNOSIS — Z452 Encounter for adjustment and management of vascular access device: Secondary | ICD-10-CM | POA: Diagnosis not present

## 2015-07-22 ENCOUNTER — Ambulatory Visit (INDEPENDENT_AMBULATORY_CARE_PROVIDER_SITE_OTHER): Payer: 59 | Admitting: Neurology

## 2015-07-22 ENCOUNTER — Encounter: Payer: Self-pay | Admitting: Neurology

## 2015-07-22 VITALS — BP 123/90 | HR 83 | Ht 67.0 in | Wt 256.0 lb

## 2015-07-22 DIAGNOSIS — Z452 Encounter for adjustment and management of vascular access device: Secondary | ICD-10-CM | POA: Diagnosis not present

## 2015-07-22 DIAGNOSIS — Z79899 Other long term (current) drug therapy: Secondary | ICD-10-CM

## 2015-07-22 DIAGNOSIS — G43009 Migraine without aura, not intractable, without status migrainosus: Secondary | ICD-10-CM | POA: Diagnosis not present

## 2015-07-22 DIAGNOSIS — G932 Benign intracranial hypertension: Secondary | ICD-10-CM | POA: Diagnosis not present

## 2015-07-22 DIAGNOSIS — Z7901 Long term (current) use of anticoagulants: Secondary | ICD-10-CM | POA: Diagnosis not present

## 2015-07-22 DIAGNOSIS — E860 Dehydration: Secondary | ICD-10-CM | POA: Diagnosis not present

## 2015-07-22 DIAGNOSIS — Z7951 Long term (current) use of inhaled steroids: Secondary | ICD-10-CM | POA: Diagnosis not present

## 2015-07-22 MED ORDER — PROPRANOLOL HCL 10 MG PO TABS: 10.0000 mg | ORAL_TABLET | Freq: Two times a day (BID) | ORAL | Status: DC

## 2015-07-22 MED ORDER — TOPIRAMATE 100 MG PO TABS: 200.0000 mg | ORAL_TABLET | Freq: Every day | ORAL | Status: DC

## 2015-07-22 MED FILL — PROPRANOLOL 10 MG TABLET: 10 | 30 days supply | Qty: 60 | Fill #0

## 2015-07-22 NOTE — Progress Notes (Signed)
[facility]    Provider:  Dr Jaynee Eagles Referring Provider: Glenford Bayley, DO Primary Care Physician:  [name], MD  CC: Headaches  Interval history 07/22/2015: She had the roux en y gastric bypass. She is having significant postoperative complications and she is crying in the office today. Her headaches have increased. The headaches are daily. The headaches are more behind the right eye with throbbing and light sensitivity, they sound more migrainous today than pressure/IIH. The topamax doesn't help like it used to and she feels as though it is contributing to her significant memory changes and depression. She has pressure around the ears as well. Her ears will start hurting. More intense. Memory is terrible.  Last visit with Dr Katy Fitch was all normal, visual fields normal. She is on Topamax 150mg  twice daily. She is feeling more depressed. She was feeling better on the Topamax but since the surgery the headaches are worse and memory worse and depression.   Interval update 06/03/2015: Patient with obesity and intracranial idiopathic hypertension here for follow up.Repeat MRI consistent with IIH (There is flattening of the pituitary gland within a mildly enlarged sella turcica and widening of the optic nerves sheaths) and elevated opening pressure on LP(23cm). She is currently On Topamax due to Sulfa allergy and can't take diamox. Discussed weight loss as integral to the management of her condition that can cause intractable headaches and permanent blindness. She is taking Topamax 200mg  only at night. She feels tired. She has some pins and needles in the arms and feet. She has headaches in the morning. She had a sleep study at [facility] within the last year that did not show OSA. She does not smoke. She is having smelling problems. She has throbbing headaches. She has vision changes. She has blurry vision. Her husband had a vascectomy and she had cervical abalation and she says she cannot  have children at this time. Advised to be careful and use backup due to teratogenicity of topamax. Discussed weight loss as the most important way to help with this disorder. If vision or headaches significantly worsen go directly to the ED.    Patient has a sulfa allergy cannot use diamox. Will increase Topamax to 200mg  and then may have to further increase as this is a weaker medication than acetazolamide. Will refer to Dr. Midge Aver for evaluation.   HPI: Margaret Fletcher is a 45 y.o. female here as a referral from Dr. Marin Comment for headaches. PMHx morbid obesity, migraines, depression. She has had headaches since [facility], no inciting events or trauma. In the last several years they are unilateral, she gets unilateral ptosis, she has throbbing, light and noise sensitivity, nausea, dizziness, vertigo. She have worsening anosmia and food smells different, taste is affected. Worsening headaches. She has headache almost every day and maybe 4-5 a month that are severe and migrainous. Worsening over the last year. She takes Excedrin migraine almost daily. She gets unilateral vision changes, blurriness with the headaches. Worst headache can last 12 hours, 10/10, no vomiting. She has had to leave work early. She has right ear hearing changes. She had a sleep study completed at [facility] last year.   Reviewed notes, labs and imaging from outside physicians, which showed:  MRI of the brain 06/2011: Personally reviewed and agree with the following IMPRESSION: No acute intracranial findings.  No visible white matter disease, specifically no evidence for complicated migraine.  Abnormally increased amount of cerebrospinal fluid in the sella turcica with flattening  of the pituitary gland against the floor.This appearance is consistent with a diagnosis of empty sella, although the relationship of this abnormality to the patient's clinical symptomatology is not established.    Review of Systems: Patient  complains of symptoms per HPI as well as the following symptoms: memory loss, headache Pertinent negatives per HPI. All others negative.   Social History   Social History  . Marital Status: Divorced    Spouse Name: N/A  . Number of Children: 2  . Years of Education: 16   Occupational History  . [employer]- nurse tech    Social History Main Topics  . Smoking status: Never Smoker   . Smokeless tobacco: Never Used  . Alcohol Use: Yes     Comment: socially  . Drug Use: No  . Sexual Activity: Yes    Birth Control/ Protection: Surgical   Other Topics Concern  . Not on file   Social History Narrative   Lives with partner   Caffeine use: minimal coffee    Family History  Problem Relation Age of Onset  . Cancer Mother   . Stroke Father   . Diabetes Brother   . Seizures Son   . Migraines Neg Hx     Past Medical History  Diagnosis Date  . Alopecia   . Autoimmune disease ([place])   . Bacterial vaginosis   . Frequent UTI   . Cyst of spleen   . Depression   . Hypertrophy of breast   . Depression   . Anxiety   . Migraine headache     denies, ruled out   . Migraine   . IIH (idiopathic intracranial hypertension) 2017    Past Surgical History  Procedure Laterality Date  . Cesarean section    . Cholecystectomy    . Appendectomy    . Cesarean section    . Wisdom tooth extraction    . Tubal ligation    . Dilitation & currettage/hystroscopy with novasure ablation N/A 07/19/2012    Procedure: HYSTEROSCOPY WITH NOVASURE ABLATION;  Surgeon: Farrel Gobble. Harrington Challenger, MD;  Location: [facility];  Service: Gynecology;  Laterality: N/A;  . Endometrial ablation    . Breath tek h pylori N/A 05/21/2014    Procedure: BREATH TEK H PYLORI;  Surgeon: Greer Pickerel, MD;  Location: [facility];  Service: General;  Laterality: N/A;  . Breast reduction surgery Bilateral 03/26/2015    Procedure: BILATERAL BREAST REDUCTION  ;  Surgeon: Youlanda Roys, MD;  Location: [facility];   Service: Plastics;  Laterality: Bilateral;  . Laparoscopic roux-en-y gastric bypass with hiatal hernia repair N/A 07/08/2015    Procedure: LAPAROSCOPIC ROUX-EN-Y GASTRIC BYPASS WITH HIATAL HERNIA REPAIR WITH UPPER ENDOSCOPY;  Surgeon: Greer Pickerel, MD;  Location: [facility];  Service: General;  Laterality: N/A;    Current Outpatient Prescriptions  Medication Sig Dispense Refill  . Albuterol Sulfate (PROAIR RESPICLICK) 123XX123 (90 Base) MCG/ACT AEPB Inhale 2 puffs into the lungs every 6 (six) hours as needed. 1 each 3  . ALPRAZolam (XANAX) 0.5 MG tablet Take 1 tablet (0.5 mg total) by mouth 2 (two) times daily as needed for anxiety. Can take 1-2 30 minutes before MRI. Do not drive. 30 tablet 1  . buPROPion (WELLBUTRIN XL) 300 MG 24 hr tablet Take 1 tablet (300 mg total) by mouth daily. 90 tablet 3  . CALCIUM-VITAMIN D PO Take 15 mLs by mouth 3 (three) times daily.    . Cyanocobalamin (VITAMIN B 12 PO) Take 1  tablet by mouth daily.    . ondansetron (ZOFRAN-ODT) 4 MG disintegrating tablet Take 1 tablet (4 mg total) by mouth every 6 (six) hours as needed for nausea. 30 tablet 0  . oxyCODONE (ROXICODONE) 5 MG/5ML solution Take 2.5 mLs (2.5 mg total) by mouth every 4 (four) hours as needed for moderate pain or severe pain (before meals as needed for pain). 150 mL 0  . Pediatric Multiple Vitamins (MULTIVITAMIN) LIQD Take 30 mLs by mouth 2 (two) times daily.    . sucralfate (CARAFATE) 1 GM/10ML suspension Take 10 mLs (1 g total) by mouth 4 (four) times daily -  with meals and at bedtime. 420 mL 0  . SUMAtriptan (IMITREX) 100 MG tablet Take 1 tablet (100 mg total) by mouth once as needed for migraine. May repeat in 2 hours if headache persists or recurs. 10 tablet 12  . thiamine 123XX123 mg, folic acid 1 mg, multivitamins adult 10 mL in sodium chloride 0.9 % 1,000 mL Inject 1,000 mLs into the vein once. 1L IV every 3 days; first dose Sunday 12 Package 1  . topiramate (TOPAMAX) 100 MG tablet Take 1.5 tablets (150 mg total)  by mouth 2 (two) times daily. 90 tablet 11   No current facility-administered medications for this visit.    Allergies as of 07/22/2015 - Review Complete 07/22/2015  Allergen Reaction Noted  . Sulfa antibiotics Anaphylaxis 01/18/2011    Vitals: BP 123/90 mmHg  Pulse 83  Ht 5\' 7"  (1.702 m)  Wt 256 lb (116.121 kg)  BMI 40.09 kg/m2  LMP 06/24/2015 Last Weight:  Wt Readings from Last 1 Encounters:  07/22/15 256 lb (116.121 kg)   Last Height:   Ht Readings from Last 1 Encounters:  07/22/15 5\' 7"  (1.702 m)     Neuro: Detailed Neurologic Exam  Speech:  Speech is normal; fluent and spontaneous with normal comprehension.  Cognition:  The patient is oriented to person, place, and time;   recent and remote memory intact;   language fluent;   normal attention, concentration,   fund of knowledge Cranial Nerves:  The pupils are equal, round, and reactive to light. The fundi are normal and spontaneous venous pulsations are present. Visual fields are full to finger confrontation. Extraocular movements are intact. Trigeminal sensation is intact and the muscles of mastication are normal. The face is symmetric. The palate elevates in the midline. Hearing intact. Voice is normal. Shoulder shrug is normal. The tongue has normal motion without fasciculations.   Coordination:  Normal finger to nose and heel to shin. Normal rapid alternating movements.   Gait:  Heel-toe and tandem gait are normal.   Motor Observation:  No asymmetry, no atrophy, and no involuntary movements noted. Tone:  Normal muscle tone.   Posture:  Posture is normal. normal erect   Strength:  Strength is V/V in the upper and lower limbs.    Sensation: intact to LT   Reflex Exam:  DTR's:  Deep tendon reflexes in the upper and lower extremities are normal bilaterally.  Toes:  The toes are downgoing bilaterally.  Clonus:  Clonus is  absent     Assessment/Plan: 45 year old morbidly obese patient with worsening headache, partially empty sella on MRI of the brain in 2013. Repeat MRI consistent with IIH. She also has a history of migraines. She is allergic to sulfa drugs and therefore cannot use Diamox. She has been on Topamax 150 mg twice daily and feels as though she is more depressed with worsening memory  changes. She is also having postoperative complications to her gastric bypass. She is crying in the office today.  Not sure if worsening headache due to recent stress, surgey complications, IIH or her migraines. She also has a history of migraines and her headaches sound more migrainous along with the pressure headaches. Her findoscopic exams have been normal and visual fields normal, can try and decrease topamax due to worsening fatigue, memory problems, depression (all of which can be caused by Topamax)- and watch for headache progression, Can try some propranolol twice daily and see if this helps with her migraines.   Some Medication Overuse with Excedrin daily. Advised no more than 2-3x a week.  Lumbar puncture - opening pressure 23 MRi of the brain w/wo contrast - c/w IIH Start Topiramate - decreased to 200 mg daily at bedtime given worsening memory and depression (funduscopic exam and visual fields have been normal) She has an anaphylactic allergy to sulfa, cannot use diamox. Follo wup with Dr. Katy Fitch Discussed IIH and risk of permanent vision loss, proceed to ED directly if vision changes  Discussed Topiramate side effects. Serious side effects can inclue: Teratogenicity, Abdominal or stomach pain ,fever, chills, or sore throat , lessening of sensations or perception ,loss of appetite , mood or mental changes, including aggression, agitation, apathy, irritability, and mental depression , red, irritated, or bleeding gums , weight loss, nephrolithiasis.  She endorses compliance with Topamax. She missed it for just a few  days last week when she was nothing by mouth but otherwise she has been taking it consistently and be started 5 days ago. We'll do a level today.  Sarina Ill, MD  [facility] [address] [place], [place] [address]  Phone [phone] Fax [phone]  A total of 40 minutes was spent face-to-face with this patient. Over half this time was spent on counseling patient on the IIH diagnosis and different diagnostic and therapeutic options available.

## 2015-07-22 NOTE — Patient Instructions (Signed)
Overall you are doing fairly well but I do want to suggest a few things today:   Remember to drink plenty of fluid, eat healthy meals and do not skip any meals. Try to eat protein with a every meal and eat a healthy snack such as fruit or nuts in between meals. Try to keep a regular sleep-wake schedule and try to exercise daily, particularly in the form of walking, 20-30 minutes a day, if you can.   As far as your medications are concerned, I would like to suggest:  Topiramate 200 mg at night Propranolol 10mg  twice a day.   Common propranolol side effects may include:nausea, vomiting, diarrhea, constipation, stomach cramps; decreased sex drive, impotence, or difficulty having an orgasm;sleep problems (insomnia); or tired feeling. Do NOT get pregnant on this medication.Stop immediately for: slow or uneven heartbeats;a light-headed feeling, like you might pass out;wheezing or trouble breathing;shortness of breath (even with mild exertion), swelling, rapid weight gain;.sudden weakness, vision problems, or loss of coordination (especially in a child with hemangioma that affects the face or head);cold feeling in your hands and feet;depression, confusion, hallucinations;liver problems - nausea, upper stomach pain, itching, tired feeling, loss of appetite, dark urine, clay-colored stools, jaundice (yellowing of the skin or eyes);low blood sugar - headache, hunger, weakness, sweating, confusion, irritability, dizziness, fast heart rate, or feeling jittery;low blood sugar in a baby - pale skin, blue or purple skin, sweating, fussiness, crying, not wanting to eat, feeling cold, drowsiness, weak or shallow breathing (breathing may stop for short periods), seizure (convulsions), or loss of consciousness; or severe skin reaction - fever, sore throat, swelling in your face or tongue, burning in your eyes, skin pain, followed by a red or purple skin rash that spreads (especially in the face or upper body) and causes  blistering and peeling.   As far as diagnostic testing: lab today  I would like to see you back in 6-8 weeks, sooner if we need to. Please call us with any interim questions, concerns, problems, updates or refill requests.   Please also call us for any test results so we can go over those with you on the phone.  My clinical assistant and will answer any of your questions and relay your messages to me and also relay most of my messages to you.   Our phone number is [phone]. We also have an after hours call service for urgent matters and there is a physician on-call for urgent questions. For any emergencies you know to call 911 or go to the nearest emergency room

## 2015-07-23 ENCOUNTER — Ambulatory Visit: Payer: 59

## 2015-07-23 ENCOUNTER — Encounter: Payer: 59 | Attending: General Surgery

## 2015-07-23 DIAGNOSIS — Z713 Dietary counseling and surveillance: Secondary | ICD-10-CM | POA: Diagnosis not present

## 2015-07-23 NOTE — Progress Notes (Signed)
Bariatric Class:  Appt start time: 1530 end time:  1630.  2 Week Post-Operative Nutrition Class  Patient was seen on 07/23/2015 for Post-Operative Nutrition education [facility].   Surgery date: 07/08/2015 Surgery type: RYGB Start weight at [facility]: 278 lbs on 05/24/2014 Weight today: 253.8 lbs  Weight change: 12.2 lbs   TANITA  BODY COMP RESULTS  07/01/15 07/23/15   BMI (kg/m^2) 40.4 38.6   Fat Mass (lbs) 138.5 131.0   Fat Free Mass (lbs) 127.5 122.8   Total Body Water (lbs) 93.5 90.4    The following the learning objectives were met by the patient during this course:  Identifies Phase 3A (Soft, High Proteins) Dietary Goals and will begin from 2 weeks post-operatively to 2 months post-operatively  Identifies appropriate sources of fluids and proteins   States protein recommendations and appropriate sources post-operatively  Identifies the need for appropriate texture modifications, mastication, and bite sizes when consuming solids  Identifies appropriate multivitamin and calcium sources post-operatively  Describes the need for physical activity post-operatively and will follow MD recommendations  States when to call healthcare provider regarding medication questions or post-operative complications  Handouts given during class include:  Phase 3A: Soft, High Protein Diet Handout  Follow-Up Plan: Patient will follow-up at [facility] in 6 weeks for 2 month post-op nutrition visit for diet advancement per MD.

## 2015-07-23 NOTE — Addendum Note (Signed)
Addended by: Sarina Ill B on: 07/23/2015 08:00 PM   Modules accepted: Level of Service

## 2015-07-24 ENCOUNTER — Telehealth: Payer: Self-pay | Admitting: *Deleted

## 2015-07-24 DIAGNOSIS — Z7901 Long term (current) use of anticoagulants: Secondary | ICD-10-CM | POA: Diagnosis not present

## 2015-07-24 DIAGNOSIS — Z452 Encounter for adjustment and management of vascular access device: Secondary | ICD-10-CM | POA: Diagnosis not present

## 2015-07-24 DIAGNOSIS — Z7951 Long term (current) use of inhaled steroids: Secondary | ICD-10-CM | POA: Diagnosis not present

## 2015-07-24 LAB — TOPIRAMATE LEVEL: TOPIRAMATE LVL: 10 ug/mL (ref 2.0–25.0)

## 2015-07-24 NOTE — Telephone Encounter (Signed)
-----   Message from Melvenia Beam, MD sent at 07/24/2015 12:11 PM EDT ----- Topamax level looks good thanks

## 2015-07-24 NOTE — Telephone Encounter (Signed)
Called and spoke to pt. Relayed topamax level looks good per Dr Jaynee Eagles. Advised her to continue to take medication as prescribed. Pt verbalized understanding.

## 2015-07-26 DIAGNOSIS — Z7951 Long term (current) use of inhaled steroids: Secondary | ICD-10-CM | POA: Diagnosis not present

## 2015-07-26 DIAGNOSIS — Z7901 Long term (current) use of anticoagulants: Secondary | ICD-10-CM | POA: Diagnosis not present

## 2015-07-26 DIAGNOSIS — Z452 Encounter for adjustment and management of vascular access device: Secondary | ICD-10-CM | POA: Diagnosis not present

## 2015-07-28 DIAGNOSIS — E860 Dehydration: Secondary | ICD-10-CM | POA: Diagnosis not present

## 2015-07-29 DIAGNOSIS — Z7901 Long term (current) use of anticoagulants: Secondary | ICD-10-CM | POA: Diagnosis not present

## 2015-07-29 DIAGNOSIS — E860 Dehydration: Secondary | ICD-10-CM | POA: Diagnosis not present

## 2015-07-29 DIAGNOSIS — Z452 Encounter for adjustment and management of vascular access device: Secondary | ICD-10-CM | POA: Diagnosis not present

## 2015-07-29 DIAGNOSIS — Z7951 Long term (current) use of inhaled steroids: Secondary | ICD-10-CM | POA: Diagnosis not present

## 2015-07-31 DIAGNOSIS — Z452 Encounter for adjustment and management of vascular access device: Secondary | ICD-10-CM | POA: Diagnosis not present

## 2015-07-31 DIAGNOSIS — Z7901 Long term (current) use of anticoagulants: Secondary | ICD-10-CM | POA: Diagnosis not present

## 2015-07-31 DIAGNOSIS — Z7951 Long term (current) use of inhaled steroids: Secondary | ICD-10-CM | POA: Diagnosis not present

## 2015-08-02 ENCOUNTER — Other Ambulatory Visit: Payer: Self-pay | Admitting: General Surgery

## 2015-08-02 DIAGNOSIS — Z452 Encounter for adjustment and management of vascular access device: Secondary | ICD-10-CM | POA: Diagnosis not present

## 2015-08-02 DIAGNOSIS — Z7901 Long term (current) use of anticoagulants: Secondary | ICD-10-CM | POA: Diagnosis not present

## 2015-08-02 DIAGNOSIS — Z9884 Bariatric surgery status: Secondary | ICD-10-CM

## 2015-08-02 DIAGNOSIS — Z7951 Long term (current) use of inhaled steroids: Secondary | ICD-10-CM | POA: Diagnosis not present

## 2015-08-02 MED FILL — CARAFATE 1 GM/10 ML SUSP: 1 | 10 days supply | Qty: 420 | Fill #0

## 2015-08-04 DIAGNOSIS — E860 Dehydration: Secondary | ICD-10-CM | POA: Diagnosis not present

## 2015-08-05 DIAGNOSIS — Z452 Encounter for adjustment and management of vascular access device: Secondary | ICD-10-CM | POA: Diagnosis not present

## 2015-08-05 DIAGNOSIS — Z5181 Encounter for therapeutic drug level monitoring: Secondary | ICD-10-CM | POA: Diagnosis not present

## 2015-08-05 DIAGNOSIS — E860 Dehydration: Secondary | ICD-10-CM | POA: Diagnosis not present

## 2015-08-05 DIAGNOSIS — Z7901 Long term (current) use of anticoagulants: Secondary | ICD-10-CM | POA: Diagnosis not present

## 2015-08-05 DIAGNOSIS — K9589 Other complications of other bariatric procedure: Secondary | ICD-10-CM | POA: Diagnosis not present

## 2015-08-05 DIAGNOSIS — Z7951 Long term (current) use of inhaled steroids: Secondary | ICD-10-CM | POA: Diagnosis not present

## 2015-08-06 ENCOUNTER — Ambulatory Visit (INDEPENDENT_AMBULATORY_CARE_PROVIDER_SITE_OTHER): Payer: 59 | Admitting: Family Medicine

## 2015-08-06 ENCOUNTER — Ambulatory Visit
Admission: RE | Admit: 2015-08-06 | Discharge: 2015-08-06 | Disposition: A | Discharge status: Home / Self Care | Payer: 59 | Source: Ambulatory Visit | Attending: General Surgery | Admitting: General Surgery

## 2015-08-06 VITALS — BP 118/72 | HR 70 | Temp 98.2°F | Resp 17 | Ht 68.0 in | Wt 251.0 lb

## 2015-08-06 DIAGNOSIS — Z9884 Bariatric surgery status: Secondary | ICD-10-CM

## 2015-08-06 DIAGNOSIS — D1803 Hemangioma of intra-abdominal structures: Secondary | ICD-10-CM | POA: Diagnosis not present

## 2015-08-06 DIAGNOSIS — R300 Dysuria: Secondary | ICD-10-CM | POA: Diagnosis not present

## 2015-08-06 DIAGNOSIS — R319 Hematuria, unspecified: Secondary | ICD-10-CM | POA: Diagnosis not present

## 2015-08-06 DIAGNOSIS — N3001 Acute cystitis with hematuria: Secondary | ICD-10-CM | POA: Diagnosis not present

## 2015-08-06 LAB — POCT URINALYSIS DIP (MANUAL ENTRY)
Glucose, UA: NEGATIVE
NITRITE UA: POSITIVE — AB
PH UA: 5.5
Protein Ur, POC: 30 — AB
SPEC GRAV UA: 1.01
Urobilinogen, UA: 1

## 2015-08-06 LAB — POC MICROSCOPIC URINALYSIS (UMFC)

## 2015-08-06 MED ORDER — IOPAMIDOL (ISOVUE-300) INJECTION 61%
125.0000 mL | Freq: Once | INTRAVENOUS | Status: AC | PRN
  Administered 2015-08-06: 125 mL via INTRAVENOUS

## 2015-08-06 MED ORDER — CEFDINIR 300 MG PO CAPS: 300.0000 mg | ORAL_CAPSULE | Freq: Two times a day (BID) | ORAL | Status: DC

## 2015-08-06 MED ORDER — CEFDINIR 250 MG/5ML PO SUSR: ORAL | Status: DC

## 2015-08-06 MED FILL — CEFDINIR 250 MG/5 ML SUSP: 250 | 10 days supply | Qty: 120 | Fill #0

## 2015-08-06 NOTE — Patient Instructions (Addendum)
IF you received an x-ray today, you will receive an invoice from [facility]. Please contact [facility] at [phone] with questions or concerns regarding your invoice.   IF you received labwork today, you will receive an invoice from [employer]. Please contact [facility] at [phone] with questions or concerns regarding your invoice.   Our billing staff will not be able to assist you with questions regarding bills from these companies.  You will be contacted with the lab results as soon as they are available. The fastest way to get your results is to activate your My Chart account. Instructions are located on the last page of this paperwork. If you have not heard from [name] regarding the results in 2 weeks, please contact this office.     You do appear to have a urinary tract infection. Start Omnicef twice per day.  I will check a urine culture to make sure the infection is sensitive to this antibiotic.  Return to the clinic or go to the nearest emergency room if any of your symptoms worsen or new symptoms occur.  Urinary Tract Infection Urinary tract infections (UTIs) can develop anywhere along your urinary tract. Your urinary tract is your body's drainage system for removing wastes and extra water. Your urinary tract includes two kidneys, two ureters, a bladder, and a urethra. Your kidneys are a pair of bean-shaped organs. Each kidney is about the size of your fist. They are located below your ribs, one on each side of your spine. CAUSES Infections are caused by microbes, which are microscopic organisms, including fungi, viruses, and bacteria. These organisms are so small that they can only be seen through a microscope. Bacteria are the microbes that most commonly cause UTIs. SYMPTOMS  Symptoms of UTIs may vary by age and gender of the patient and by the location of the infection. Symptoms in young women typically include a frequent and  intense urge to urinate and a painful, burning feeling in the bladder or urethra during urination. Older women and men are more likely to be tired, shaky, and weak and have muscle aches and abdominal pain. A fever may mean the infection is in your kidneys. Other symptoms of a kidney infection include pain in your back or sides below the ribs, nausea, and vomiting. DIAGNOSIS To diagnose a UTI, your caregiver will ask you about your symptoms. Your caregiver will also ask you to provide a urine sample. The urine sample will be tested for bacteria and white blood cells. White blood cells are made by your body to help fight infection. TREATMENT  Typically, UTIs can be treated with medication. Because most UTIs are caused by a bacterial infection, they usually can be treated with the use of antibiotics. The choice of antibiotic and length of treatment depend on your symptoms and the type of bacteria causing your infection. HOME CARE INSTRUCTIONS  If you were prescribed antibiotics, take them exactly as your caregiver instructs you. Finish the medication even if you feel better after you have only taken some of the medication.  Drink enough water and fluids to keep your urine clear or pale yellow.  Avoid caffeine, tea, and carbonated beverages. They tend to irritate your bladder.  Empty your bladder often. Avoid holding urine for long periods of time.  Empty your bladder before and after sexual intercourse.  After a bowel movement, women should cleanse from front to back. Use each tissue only once. SEEK MEDICAL CARE IF:   You  have back pain.  You develop a fever.  Your symptoms do not begin to resolve within 3 days. SEEK IMMEDIATE MEDICAL CARE IF:   You have severe back pain or lower abdominal pain.  You develop chills.  You have nausea or vomiting.  You have continued burning or discomfort with urination. MAKE SURE YOU:   Understand these instructions.  Will watch your  condition.  Will get help right away if you are not doing well or get worse.   This information is not intended to replace advice given to you by your health care provider. Make sure you discuss any questions you have with your health care provider.   Document Released: 12/03/2004 Document Revised: 11/14/2014 Document Reviewed: 04/03/2011 Elsevier Interactive Patient Education [employer].

## 2015-08-06 NOTE — Progress Notes (Signed)
By signing my name below I, Tereasa Coop, attest that this documentation has been prepared under the direction and in the presence of Wendie Agreste, MD. Electonically Signed. Tereasa Coop, Scribe 08/06/2015 at 4:33 PM  Subjective:    Patient ID: Margaret Fletcher, female    DOB: 1970/05/22, 45 y.o.   MRN: [id number]  Chief Complaint  Patient presents with  . Dysuria    burning with urination     HPI Margaret Fletcher is a 45 y.o. female who presents to the [facility] complaining of dysuria that started yesterday morning. Pt also c/o throbbing pelvic pain when she finishes urinating. Pt has noticed blood in toilet after urinating. Pt denies vaginal bleeding. Pt has nausea. Pt denies V/D.  Pt had gastric bypass surgery 1 month ago. Pt reports reduced oral intake since surgery.  Pt has picc line for fluids and vitamins post surgery.     Patient Active Problem List   Diagnosis Date Noted  . Dehydration 07/15/2015  . Prediabetes 07/08/2015  . Morbid obesity ([place]) 07/08/2015  . Mild obstructive sleep apnea 07/08/2015  . Dyslipidemia 07/08/2015  . S/P gastric bypass 07/08/2015  . IIH (idiopathic intracranial hypertension) 06/03/2015  . Chronic migraine without aura or status migrainosus 04/30/2015  . Worsening headaches 04/30/2015  . Vision changes 04/30/2015  . Perceived hearing changes 04/30/2015  . Liver lesion 03/21/2013  . Splenic cyst 03/21/2013  . Abdominal pain 03/21/2013   Past Medical History  Diagnosis Date  . Alopecia   . Autoimmune disease ([place])   . Bacterial vaginosis   . Frequent UTI   . Cyst of spleen   . Depression   . Hypertrophy of breast   . Depression   . Anxiety   . Migraine headache     denies, ruled out   . Migraine   . IIH (idiopathic intracranial hypertension) 2017   Past Surgical History  Procedure Laterality Date  . Cesarean section    . Cholecystectomy    . Appendectomy    . Cesarean section    . Wisdom tooth  extraction    . Tubal ligation    . Dilitation & currettage/hystroscopy with novasure ablation N/A 07/19/2012    Procedure: HYSTEROSCOPY WITH NOVASURE ABLATION;  Surgeon: Farrel Gobble. Harrington Challenger, MD;  Location: [facility];  Service: Gynecology;  Laterality: N/A;  . Endometrial ablation    . Breath tek h pylori N/A 05/21/2014    Procedure: BREATH TEK H PYLORI;  Surgeon: Greer Pickerel, MD;  Location: [facility];  Service: General;  Laterality: N/A;  . Breast reduction surgery Bilateral 03/26/2015    Procedure: BILATERAL BREAST REDUCTION  ;  Surgeon: Youlanda Roys, MD;  Location: [facility];  Service: Plastics;  Laterality: Bilateral;  . Laparoscopic roux-en-y gastric bypass with hiatal hernia repair N/A 07/08/2015    Procedure: LAPAROSCOPIC ROUX-EN-Y GASTRIC BYPASS WITH HIATAL HERNIA REPAIR WITH UPPER ENDOSCOPY;  Surgeon: Greer Pickerel, MD;  Location: [facility];  Service: General;  Laterality: N/A;   Allergies  Allergen Reactions  . Sulfa Antibiotics Anaphylaxis   Prior to Admission medications   Medication Sig Start Date End Date Taking? Authorizing Provider  ALPRAZolam Duanne Moron) 0.5 MG tablet Take 1 tablet (0.5 mg total) by mouth 2 (two) times daily as needed for anxiety. Can take 1-2 30 minutes before MRI. Do not drive. 07/04/15  Yes Robyn Haber, MD  buPROPion (WELLBUTRIN XL) 300 MG 24 hr tablet Take 1 tablet (300 mg total) by  mouth daily. 07/04/15  Yes Robyn Haber, MD  CALCIUM-VITAMIN D PO Take 15 mLs by mouth 3 (three) times daily.   Yes Historical Provider, MD  Cyanocobalamin (VITAMIN B 12 PO) Take 1 tablet by mouth daily.   Yes Historical Provider, MD  ondansetron (ZOFRAN-ODT) 4 MG disintegrating tablet Take 1 tablet (4 mg total) by mouth every 6 (six) hours as needed for nausea. 07/19/15  Yes Greer Pickerel, MD  oxyCODONE (ROXICODONE) 5 MG/5ML solution Take 2.5 mLs (2.5 mg total) by mouth every 4 (four) hours as needed for moderate pain or severe pain (before meals as needed for pain).  07/19/15  Yes Greer Pickerel, MD  Pediatric Multiple Vitamins (MULTIVITAMIN) LIQD Take 30 mLs by mouth 2 (two) times daily.   Yes Historical Provider, MD  propranolol (INDERAL) 10 MG tablet Take 1 tablet (10 mg total) by mouth 2 (two) times daily. 07/22/15  Yes Melvenia Beam, MD  sucralfate (CARAFATE) 1 GM/10ML suspension Take 10 mLs (1 g total) by mouth 4 (four) times daily -  with meals and at bedtime. 07/19/15  Yes Greer Pickerel, MD  SUMAtriptan (IMITREX) 100 MG tablet Take 1 tablet (100 mg total) by mouth once as needed for migraine. May repeat in 2 hours if headache persists or recurs. 04/30/15  Yes Melvenia Beam, MD  thiamine [id number] mg, folic acid 1 mg, multivitamins adult 10 mL in sodium chloride 0.9 % 1,000 mL Inject 1,000 mLs into the vein once. 1L IV every 3 days; first dose Sunday 07/19/15  Yes Greer Pickerel, MD  topiramate (TOPAMAX) 100 MG tablet Take 2 tablets (200 mg total) by mouth at bedtime. 07/22/15  Yes Melvenia Beam, MD   Social History   Social History  . Marital Status: Divorced    Spouse Name: N/A  . Number of Children: 2  . Years of Education: 16   Occupational History  . [employer]- nurse tech    Social History Main Topics  . Smoking status: Never Smoker   . Smokeless tobacco: Never Used  . Alcohol Use: Yes     Comment: socially  . Drug Use: No  . Sexual Activity: Yes    Birth Control/ Protection: Surgical   Other Topics Concern  . Not on file   Social History Narrative   Lives with partner   Caffeine use: minimal coffee      Review of Systems  Constitutional: Negative for fever.  Gastrointestinal: Positive for nausea. Negative for vomiting and diarrhea.  Genitourinary: Positive for dysuria and hematuria.  Musculoskeletal: Negative for back pain.       Objective:   Physical Exam  Constitutional: She is oriented to person, place, and time. She appears well-developed and well-nourished. No distress.  HENT:  Head: Normocephalic and atraumatic.  Eyes:  Conjunctivae are normal. Pupils are equal, round, and reactive to light.  Neck: Neck supple.  Cardiovascular: Normal rate, regular rhythm and normal heart sounds.  Exam reveals no gallop and no friction rub.   No murmur heard. Pulmonary/Chest: Effort normal and breath sounds normal. She has no decreased breath sounds. She has no wheezes. She has no rhonchi. She has no rales.  Abdominal: Soft. Normal appearance. There is tenderness (mild) in the suprapubic area. There is no rigidity, no rebound, no guarding and no CVA tenderness.  Musculoskeletal: Normal range of motion.  Neurological: She is alert and oriented to person, place, and time.  Skin: Skin is warm and dry.  Psychiatric: She has a normal mood and  affect. Her behavior is normal.  Nursing note and vitals reviewed.    Filed Vitals:   08/06/15 1601  BP: 118/72  Pulse: 70  Temp: 98.2 F (36.8 C)  TempSrc: Oral  Resp: 17  Height: 5\' 8"  (1.727 m)  Weight: 251 lb (113.853 kg)  SpO2: 99%   Results for orders placed or performed in visit on 08/06/15  POCT urinalysis dipstick  Result Value Ref Range   Color, UA yellow yellow   Clarity, UA hazy (A) clear   Glucose, UA negative negative   Bilirubin, UA small (A) negative   Ketones, POC UA moderate (40) (A) negative   Spec Grav, UA 1.010    Blood, UA moderate (A) negative   pH, UA 5.5    Protein Ur, POC =30 (A) negative   Urobilinogen, UA 1.0    Nitrite, UA Positive (A) Negative   Leukocytes, UA small (1+) (A) Negative  POCT Microscopic Urinalysis ([facility])  Result Value Ref Range   WBC,UR,HPF,POC Too numerous to count  (A) None WBC/hpf   RBC,UR,HPF,POC None None RBC/hpf   Bacteria Many (A) None, Too numerous to count   Mucus Present (A) Absent   Epithelial Cells, UR Per Microscopy Few (A) None, Too numerous to count cells/hpf        Assessment & Plan:   Margaret Fletcher is a 45 y.o. female Dysuria - Plan: POCT urinalysis dipstick, POCT Microscopic Urinalysis ([facility]),  Urine culture  Hematuria - Plan: Urine culture  Acute cystitis with hematuria - Plan: cefdinir (OMNICEF) 250 MG/5ML suspension, DISCONTINUED: cefdinir (OMNICEF) 300 MG capsule  Cystitis, with prior hematuria. Minimal po intake currently, difficulty with pills.  Has PICC line in place, but did agree to try liquid antibiotic as small volume.   -start omnicef 300mg  BID, check urine culture, and rtc precautions discussed.     Meds ordered this encounter  Medications  . DISCONTD: cefdinir (OMNICEF) 300 MG capsule    Sig: Take 1 capsule (300 mg total) by mouth 2 (two) times daily.    Dispense:  14 capsule    Refill:  0  . cefdinir (OMNICEF) 250 MG/5ML suspension    Sig: 35ml po bid for 10 days.    Dispense:  120 mL    Refill:  0   Patient Instructions       IF you received an x-ray today, you will receive an invoice from [facility]. Please contact [facility] at [phone] with questions or concerns regarding your invoice.   IF you received labwork today, you will receive an invoice from [employer]. Please contact [facility] at [phone] with questions or concerns regarding your invoice.   Our billing staff will not be able to assist you with questions regarding bills from these companies.  You will be contacted with the lab results as soon as they are available. The fastest way to get your results is to activate your My Chart account. Instructions are located on the last page of this paperwork. If you have not heard from [name] regarding the results in 2 weeks, please contact this office.     You do appear to have a urinary tract infection. Start Omnicef twice per day.  I will check a urine culture to make sure the infection is sensitive to this antibiotic.  Return to the clinic or go to the nearest emergency room if any of your symptoms worsen or new symptoms occur.  Urinary Tract Infection Urinary tract infections (UTIs) can develop  anywhere along your urinary tract. Your urinary tract is your body's drainage system for removing wastes and extra water. Your urinary tract includes two kidneys, two ureters, a bladder, and a urethra. Your kidneys are a pair of bean-shaped organs. Each kidney is about the size of your fist. They are located below your ribs, one on each side of your spine. CAUSES Infections are caused by microbes, which are microscopic organisms, including fungi, viruses, and bacteria. These organisms are so small that they can only be seen through a microscope. Bacteria are the microbes that most commonly cause UTIs. SYMPTOMS  Symptoms of UTIs may vary by age and gender of the patient and by the location of the infection. Symptoms in young women typically include a frequent and intense urge to urinate and a painful, burning feeling in the bladder or urethra during urination. Older women and men are more likely to be tired, shaky, and weak and have muscle aches and abdominal pain. A fever may mean the infection is in your kidneys. Other symptoms of a kidney infection include pain in your back or sides below the ribs, nausea, and vomiting. DIAGNOSIS To diagnose a UTI, your caregiver will ask you about your symptoms. Your caregiver will also ask you to provide a urine sample. The urine sample will be tested for bacteria and white blood cells. White blood cells are made by your body to help fight infection. TREATMENT  Typically, UTIs can be treated with medication. Because most UTIs are caused by a bacterial infection, they usually can be treated with the use of antibiotics. The choice of antibiotic and length of treatment depend on your symptoms and the type of bacteria causing your infection. HOME CARE INSTRUCTIONS  If you were prescribed antibiotics, take them exactly as your caregiver instructs you. Finish the medication even if you feel better after you have only taken some of the medication.  Drink enough water and  fluids to keep your urine clear or pale yellow.  Avoid caffeine, tea, and carbonated beverages. They tend to irritate your bladder.  Empty your bladder often. Avoid holding urine for long periods of time.  Empty your bladder before and after sexual intercourse.  After a bowel movement, women should cleanse from front to back. Use each tissue only once. SEEK MEDICAL CARE IF:   You have back pain.  You develop a fever.  Your symptoms do not begin to resolve within 3 days. SEEK IMMEDIATE MEDICAL CARE IF:   You have severe back pain or lower abdominal pain.  You develop chills.  You have nausea or vomiting.  You have continued burning or discomfort with urination. MAKE SURE YOU:   Understand these instructions.  Will watch your condition.  Will get help right away if you are not doing well or get worse.   This information is not intended to replace advice given to you by your health care provider. Make sure you discuss any questions you have with your health care provider.   Document Released: 12/03/2004 Document Revised: 11/14/2014 Document Reviewed: 04/03/2011 Elsevier Interactive Patient Education [employer].      I personally performed the services described in this documentation, which was scribed in my presence. The recorded information has been reviewed and considered, and addended by me as needed.   Signed,   Merri Ray, MD [facility].  08/07/2015 11:45 PM

## 2015-08-07 DIAGNOSIS — Z7901 Long term (current) use of anticoagulants: Secondary | ICD-10-CM | POA: Diagnosis not present

## 2015-08-07 DIAGNOSIS — Z7951 Long term (current) use of inhaled steroids: Secondary | ICD-10-CM | POA: Diagnosis not present

## 2015-08-07 DIAGNOSIS — Z452 Encounter for adjustment and management of vascular access device: Secondary | ICD-10-CM | POA: Diagnosis not present

## 2015-08-08 DIAGNOSIS — R110 Nausea: Secondary | ICD-10-CM | POA: Diagnosis present

## 2015-08-08 LAB — URINE CULTURE: Colony Count: 80000

## 2015-08-09 DIAGNOSIS — Z452 Encounter for adjustment and management of vascular access device: Secondary | ICD-10-CM | POA: Diagnosis not present

## 2015-08-09 DIAGNOSIS — Z7901 Long term (current) use of anticoagulants: Secondary | ICD-10-CM | POA: Diagnosis not present

## 2015-08-09 DIAGNOSIS — Z7951 Long term (current) use of inhaled steroids: Secondary | ICD-10-CM | POA: Diagnosis not present

## 2015-08-12 DIAGNOSIS — Z452 Encounter for adjustment and management of vascular access device: Secondary | ICD-10-CM | POA: Diagnosis not present

## 2015-08-12 DIAGNOSIS — Z7901 Long term (current) use of anticoagulants: Secondary | ICD-10-CM | POA: Diagnosis not present

## 2015-08-12 DIAGNOSIS — Z7951 Long term (current) use of inhaled steroids: Secondary | ICD-10-CM | POA: Diagnosis not present

## 2015-08-12 DIAGNOSIS — E860 Dehydration: Secondary | ICD-10-CM | POA: Diagnosis not present

## 2015-08-13 DIAGNOSIS — Z760 Encounter for issue of repeat prescription: Secondary | ICD-10-CM | POA: Diagnosis not present

## 2015-08-14 DIAGNOSIS — Z7951 Long term (current) use of inhaled steroids: Secondary | ICD-10-CM | POA: Diagnosis not present

## 2015-08-14 DIAGNOSIS — Z452 Encounter for adjustment and management of vascular access device: Secondary | ICD-10-CM | POA: Diagnosis not present

## 2015-08-14 DIAGNOSIS — Z7901 Long term (current) use of anticoagulants: Secondary | ICD-10-CM | POA: Diagnosis not present

## 2015-08-15 ENCOUNTER — Ambulatory Visit: Payer: Self-pay | Admitting: General Surgery

## 2015-08-16 ENCOUNTER — Observation Stay (HOSPITAL_COMMUNITY)
Admission: RE | Admit: 2015-08-16 | Discharge: 2015-08-19 | Disposition: A | Discharge status: Home Health | Payer: 59 | Source: Ambulatory Visit | Attending: General Surgery | Admitting: General Surgery

## 2015-08-16 ENCOUNTER — Ambulatory Visit: Payer: Self-pay | Admitting: General Surgery

## 2015-08-16 ENCOUNTER — Encounter (HOSPITAL_COMMUNITY): Payer: Self-pay

## 2015-08-16 ENCOUNTER — Encounter (HOSPITAL_COMMUNITY)
Admission: RE | Disposition: A | Discharge status: Home Health | Payer: Self-pay | Source: Ambulatory Visit | Attending: General Surgery

## 2015-08-16 DIAGNOSIS — E559 Vitamin D deficiency, unspecified: Secondary | ICD-10-CM | POA: Diagnosis not present

## 2015-08-16 DIAGNOSIS — Z6836 Body mass index (BMI) 36.0-36.9, adult: Secondary | ICD-10-CM | POA: Insufficient documentation

## 2015-08-16 DIAGNOSIS — Z79899 Other long term (current) drug therapy: Secondary | ICD-10-CM | POA: Diagnosis not present

## 2015-08-16 DIAGNOSIS — Z9884 Bariatric surgery status: Secondary | ICD-10-CM | POA: Insufficient documentation

## 2015-08-16 DIAGNOSIS — R1013 Epigastric pain: Principal | ICD-10-CM | POA: Diagnosis present

## 2015-08-16 DIAGNOSIS — E669 Obesity, unspecified: Secondary | ICD-10-CM | POA: Diagnosis not present

## 2015-08-16 DIAGNOSIS — G4733 Obstructive sleep apnea (adult) (pediatric): Secondary | ICD-10-CM | POA: Diagnosis not present

## 2015-08-16 DIAGNOSIS — Z9049 Acquired absence of other specified parts of digestive tract: Secondary | ICD-10-CM | POA: Diagnosis not present

## 2015-08-16 DIAGNOSIS — E441 Mild protein-calorie malnutrition: Secondary | ICD-10-CM | POA: Diagnosis not present

## 2015-08-16 DIAGNOSIS — F329 Major depressive disorder, single episode, unspecified: Secondary | ICD-10-CM | POA: Insufficient documentation

## 2015-08-16 DIAGNOSIS — K210 Gastro-esophageal reflux disease with esophagitis: Secondary | ICD-10-CM | POA: Insufficient documentation

## 2015-08-16 DIAGNOSIS — G932 Benign intracranial hypertension: Secondary | ICD-10-CM | POA: Diagnosis not present

## 2015-08-16 DIAGNOSIS — E440 Moderate protein-calorie malnutrition: Secondary | ICD-10-CM | POA: Diagnosis present

## 2015-08-16 HISTORY — PX: ESOPHAGOGASTRODUODENOSCOPY: SHX5428

## 2015-08-16 LAB — PREALBUMIN: PREALBUMIN: 14.7 mg/dL — AB (ref 18–38)

## 2015-08-16 LAB — COMPREHENSIVE METABOLIC PANEL
ALK PHOS: 47 U/L (ref 38–126)
ALT: 16 U/L (ref 14–54)
AST: 18 U/L (ref 15–41)
Albumin: 3.3 g/dL — ABNORMAL LOW (ref 3.5–5.0)
Anion gap: 6 (ref 5–15)
BILIRUBIN TOTAL: 0.6 mg/dL (ref 0.3–1.2)
BUN: 15 mg/dL (ref 6–20)
CALCIUM: 8.5 mg/dL — AB (ref 8.9–10.3)
CO2: 23 mmol/L (ref 22–32)
Chloride: 111 mmol/L (ref 101–111)
Creatinine, Ser: 1.08 mg/dL — ABNORMAL HIGH (ref 0.44–1.00)
GFR calc non Af Amer: >60 mL/min (ref >60)
Glucose, Bld: 77 mg/dL (ref 65–99)
Potassium: 3.4 mmol/L — ABNORMAL LOW (ref 3.5–5.1)
Sodium: 140 mmol/L (ref 135–145)
TOTAL PROTEIN: 6.2 g/dL — AB (ref 6.5–8.1)

## 2015-08-16 SURGERY — EGD (ESOPHAGOGASTRODUODENOSCOPY)
Anesthesia: Moderate Sedation

## 2015-08-16 MED ORDER — BUTAMBEN-TETRACAINE-BENZOCAINE 2-2-14 % EX AERO
INHALATION_SPRAY | CUTANEOUS | Status: DC | PRN
  Administered 2015-08-16: 2 via TOPICAL

## 2015-08-16 MED ORDER — DIPHENHYDRAMINE HCL 50 MG/ML IJ SOLN
INTRAMUSCULAR | Status: AC
  Filled 2015-08-16: qty 1

## 2015-08-16 MED ORDER — SODIUM CHLORIDE 0.9 % IV SOLN
INTRAVENOUS | Status: DC
  Administered 2015-08-16: 16:00:00 via INTRAVENOUS

## 2015-08-16 MED ORDER — FENTANYL CITRATE (PF) 100 MCG/2ML IJ SOLN
INTRAMUSCULAR | Status: AC
  Filled 2015-08-16: qty 2

## 2015-08-16 MED ORDER — FENTANYL CITRATE (PF) 100 MCG/2ML IJ SOLN
12.5000 ug | INTRAMUSCULAR | Status: DC | PRN
  Administered 2015-08-16: 50 ug via INTRAVENOUS
  Filled 2015-08-16: qty 2

## 2015-08-16 MED ORDER — OXYCODONE HCL 5 MG/5ML PO SOLN
2.5000 mg | ORAL | Status: DC | PRN
  Administered 2015-08-17 – 2015-08-19 (×3): 2.5 mg via ORAL
  Filled 2015-08-16 (×3): qty 5

## 2015-08-16 MED ORDER — TOPIRAMATE 100 MG PO TABS
200.0000 mg | ORAL_TABLET | Freq: Every day | ORAL | Status: DC
  Administered 2015-08-16 – 2015-08-18 (×3): 200 mg via ORAL
  Filled 2015-08-16 (×4): qty 2

## 2015-08-16 MED ORDER — FENTANYL CITRATE (PF) 100 MCG/2ML IJ SOLN
INTRAMUSCULAR | Status: DC | PRN
  Administered 2015-08-16 (×4): 25 ug via INTRAVENOUS

## 2015-08-16 MED ORDER — ADULT MULTIVITAMIN LIQUID CH
15.0000 mL | Freq: Two times a day (BID) | ORAL | Status: DC
  Administered 2015-08-16 – 2015-08-19 (×6): 15 mL via ORAL
  Filled 2015-08-16 (×9): qty 15

## 2015-08-16 MED ORDER — MIDAZOLAM HCL 10 MG/2ML IJ SOLN
INTRAMUSCULAR | Status: DC | PRN
  Administered 2015-08-16 (×3): 2 mg via INTRAVENOUS

## 2015-08-16 MED ORDER — MIDAZOLAM HCL 5 MG/ML IJ SOLN
INTRAMUSCULAR | Status: AC
  Filled 2015-08-16: qty 2

## 2015-08-16 MED ORDER — ONDANSETRON HCL 4 MG/2ML IJ SOLN
INTRAMUSCULAR | Status: AC
Start: 2015-08-16 — End: 2015-08-16
  Filled 2015-08-16: qty 2

## 2015-08-16 MED ORDER — ONDANSETRON 4 MG PO TBDP: 4.0000 mg | ORAL_TABLET | Freq: Four times a day (QID) | ORAL | Status: DC | PRN

## 2015-08-16 MED ORDER — MULTI-DELYN PO LIQD: 30.0000 mL | Freq: Two times a day (BID) | ORAL | Status: DC

## 2015-08-16 MED ORDER — ONDANSETRON HCL 4 MG/2ML IJ SOLN
4.0000 mg | INTRAMUSCULAR | Status: DC | PRN
  Administered 2015-08-16: 4 mg via INTRAVENOUS

## 2015-08-16 MED ORDER — ENOXAPARIN SODIUM 40 MG/0.4ML ~~LOC~~ SOLN
40.0000 mg | SUBCUTANEOUS | Status: DC
Start: 2015-08-17 — End: 2015-08-19
  Administered 2015-08-17 – 2015-08-19 (×3): 40 mg via SUBCUTANEOUS
  Filled 2015-08-16 (×2): qty 0.4

## 2015-08-16 MED ORDER — PREMIER PROTEIN SHAKE
8.0000 [oz_av] | Freq: Four times a day (QID) | ORAL | Status: DC
  Administered 2015-08-16 – 2015-08-19 (×8): 8 [oz_av] via ORAL
  Filled 2015-08-16 (×12): qty 325.31

## 2015-08-16 MED ORDER — PANTOPRAZOLE SODIUM 40 MG PO PACK
40.0000 mg | PACK | Freq: Every day | ORAL | Status: DC
  Administered 2015-08-17 – 2015-08-19 (×3): 40 mg
  Filled 2015-08-16 (×4): qty 20

## 2015-08-16 MED ORDER — KCL IN DEXTROSE-NACL 20-5-0.45 MEQ/L-%-% IV SOLN
INTRAVENOUS | Status: DC
  Administered 2015-08-16: 1000 mL via INTRAVENOUS
  Administered 2015-08-17 – 2015-08-18 (×4): via INTRAVENOUS
  Filled 2015-08-16 (×7): qty 1000

## 2015-08-16 MED ORDER — BUPROPION HCL ER (XL) 300 MG PO TB24
300.0000 mg | ORAL_TABLET | Freq: Every day | ORAL | Status: DC
  Administered 2015-08-16 – 2015-08-19 (×4): 300 mg via ORAL
  Filled 2015-08-16 (×4): qty 1

## 2015-08-16 MED ORDER — SUMATRIPTAN SUCCINATE 100 MG PO TABS
100.0000 mg | ORAL_TABLET | Freq: Once | ORAL | Status: DC | PRN
  Filled 2015-08-16: qty 1

## 2015-08-16 MED ORDER — ALPRAZOLAM 0.5 MG PO TABS: 0.5000 mg | ORAL_TABLET | Freq: Two times a day (BID) | ORAL | Status: DC | PRN

## 2015-08-16 MED ORDER — SUCRALFATE 1 GM/10ML PO SUSP
1.0000 g | Freq: Three times a day (TID) | ORAL | Status: DC
  Administered 2015-08-16 – 2015-08-19 (×11): 1 g via ORAL
  Filled 2015-08-16 (×11): qty 10

## 2015-08-16 NOTE — H&P (Signed)
Margaret Fletcher 08/02/2015 1:45 PM Location: [facility] Patient #: [id number] DOB: [birth year] Divorced / Language: [name] / Race: Black or African American Female   History of Present Illness [name] M. Cannan Beeck MD; 08/04/2015 6:03 PM) The patient is a 45 year old female presenting status-post bariatric surgery. She comes in for follow-up after undergoing laparoscopic Roux-en-Y gastric bypass. She had surgery on May 1. Her preoperative weight was 273 pounds. Her postop course was complicated by being readmitted on May 8 through the 12 for difficulty with oral intake. She described it as epigastric discomfort. A repeat upper GI demonstrated no evidence of leak. There was prompt passage of contrast through her pouch into her Roux limb. Her blood count was normal. She had no fever or tachycardia. There is no suspicion of the postoperative leak. A PICC line was placed and she was sent home with intermittent IV fluid administration 3 times a week. I last saw her last Friday on 5/19. Her weight was 253 lbs. She states that she is doing about same. She is staying on liquids as instructed. Last week the epigastric discomfort was a 5 or 6 now it is a 3-4. She describes it as a upper tummy ache. She did actually have a flare last evening when she was chewing some grilled chicken for flavor and inadvertently swallowed some and it caused significant epigastric pain within 2min of swallowing it. She denies any left upper quadrant all shoulder pain. She denies any fever or chills. She is having flatus and having bowel movements. She is taking her supplements without difficulty. She is alternating between pushing water and pushing protein shakes. She is getting 1 protein shake per day +water. does better with ice. thicker liquids are more challenging. she reports she had labs this past Monday but didn't bring copy of report. Some days are better than others. She denies any heartburn or  indigestion. She is taking Protonix and carafate not religiously. doesn't think it helps.   Problem List/Past Medical [name], MD; 08/04/2015 6:07 PM) OBESITY (BMI 30-39.9) (E66.9) GASTRIC BYPASS STATUS FOR OBESITY (Z98.84) PREDIABETES (R73.03) DEPRESSION, CONTROLLED (F32.9) HERNIA, HIATAL (K44.9) DYSLIPIDEMIA (HIGH LDL; LOW HDL) (E78.4) GASTROESOPHAGEAL REFLUX DISEASE WITHOUT ESOPHAGITIS (K21.9) SPLENIC CYST (D73.4) IDIOPATHIC INTRACRANIAL HYPERTENSION (G93.2) VITAMIN D DEFICIENCY (E55.9) ALOPECIA (L65.9) MILD OBSTRUCTIVE SLEEP APNEA (G47.33)  Other Problems [name], MD; 08/04/2015 6:07 PM) Migraine Headache Hemorrhoids Anxiety Disorder  Past Surgical History [name], MD; 08/04/2015 6:07 PM) Appendectomy Cesarean Section - Multiple Gallbladder Surgery - Laparoscopic Oral Surgery  Diagnostic Studies History [name], MD; 08/04/2015 6:07 PM) Mammogram within last year Colonoscopy never Pap Smear 1-5 years ago  Allergies [name], [name]; 08/02/2015 2:02 PM) Sulfa Antibiotics  Medication History [name], [name]; 08/02/2015 2:02 PM) Vitamin D (Ergocalciferol) (50000UNIT Capsule, 1 (one) Capsule Oral once a week, Taken starting 07/03/2015) Active. Zofran ODT (4MG  Tablet Disperse, 1 (one) Oral every six hours, as needed, Taken starting 06/26/2015) Active. Protonix (40MG  Tablet DR, 1 (one) Oral daily, Taken starting 06/26/2015) Active. Biotin Active. Wellbutrin (75MG  Tablet, Oral) Active. Calcium + D (150-261-330MG -MG-IU Capsule, Oral) Active. Vitamin B12 (100MCG Tablet, Oral) Active. Magnesium Carbonate (250MG  Capsule, Oral) Active. Multiple Vitamin (Oral) Active. Vitamin B6 (200MG  Tablet, Oral) Active. Imitrex (25MG  Tablet, Oral) Active. Topiramate (100MG  Tablet, Oral) Active. Medications Reconciled  Social History [name], MD; 08/04/2015 6:07 PM) Caffeine use Carbonated beverages, Coffee,  Tea. Alcohol use Occasional alcohol use. Tobacco use Never smoker. No drug use  Family History [name], MD; 08/04/2015 6:07 PM) Breast Cancer Mother. Heart disease in female family member before age 78 Diabetes Mellitus Brother. Seizure disorder Son. Heart disease in female family member before age 84 Cerebrovascular Accident Father. Colon Polyps Father.  Pregnancy / Birth History [name], MD; 08/04/2015 6:07 PM) [name] 2 Age at menarche 3 years. Irregular periods Para 2 Maternal age 61-25  Vitals [name] [name]; 08/02/2015 2:03 PM) 08/02/2015 2:02 PM Weight: 247 lb Height: 67.5in Body Surface Area: 2.22 m Body Mass Index: 38.11 kg/m  Temp.: 97.68F(Temporal)  Pulse: 98 (Regular)  BP: 132/78 (Sitting, Left Arm, Standard)       Physical Exam [name] M. Maryiah Olvey MD; 08/04/2015 6:03 PM) General Mental Status-Alert. General Appearance-Consistent with stated age. Hydration-Well hydrated. Voice-Normal.  Chest and Lung Exam Chest and lung exam reveals -quiet, even and easy respiratory effort with no use of accessory muscles. Inspection Chest Wall - Normal. Back - normal. Auscultation Breath sounds - Normal.  Cardiovascular Auscultation Rhythm - Regular.  Abdomen Inspection Skin - Scar - Note: Incision: clean, dry, and intact. Palpation/Percussion Palpation and Percussion of the abdomen reveal - Soft, Non Tender, No Rebound tenderness and No Rigidity (guarding). Auscultation Auscultation of the abdomen reveals - Bowel sounds normal.    Assessment & Plan [name] M. Prospero Mahnke MD; 08/04/2015 6:06 PM) GASTRIC BYPASS STATUS FOR OBESITY (Z98.84) Impression: She looks well. She does not look dehydrated. I do not have a copy of her labs from this past Monday. We contacted the home health agency and they are supposed to be faxing. However they were not available by the time the patient left the appointment today. She should be  getting somewhat better by now. At this point we discussed proceeding with upper endoscopy however she would like to put off that as long as possible. I told her we may not have an option. I don't think she has a problem at her jejunojejunostomy based on the symptoms that she is telling me however I think it is important to rule out something else like her jejunojejunostomy since her repeat upper GI on her bowels back admission was within normal limits. We will set her up for CT scan for early next week. We also discussed her nutritional status. If her oral intake discomfort does not improve we discussed adding parenteral nutrition and we briefly discussed a temporary feeding tube in her gastric remnant. I will await to review the labs once they are faxed. We will add a prealbumin for her next blood draw. She was instructed on what to call for. Continue weekly lab monitoring. Follow up in 2 weeks or sooner Current Plans Instructed to keep follow-up appointment as scheduled Pt Education - [id number]  Leighton Ruff. Redmond Pulling, MD, FACS General, Bariatric, & Minimally Invasive Surgery [facility], [place]

## 2015-08-16 NOTE — Progress Notes (Signed)
[facility]  Patient Status: Active pt with [facility] prior to this readmission  [facility] is providing the following services: HHRN and [facility] for IV hydration fluids at home. [facility] hospital team will follow pt and support DC to home when ordered by MD.  IV Fluid Orders for Home Care:  NS 1000 ml with thiamin 100 mg,  MVI  10 mlnd folic acid 1 mg 3-4 times per week over 4 hours  If patient discharges after hours, please call [phone].   Margaret Fletcher 08/16/2015, 6:17 PM

## 2015-08-16 NOTE — Interval H&P Note (Signed)
History and Physical Interval Note:  08/16/2015 3:38 PM  Margaret Fletcher  has presented today for surgery, with the diagnosis of Epigastric pain slipped gastric bypass   The various methods of treatment have been discussed with the patient and family. After consideration of risks, benefits and other options for treatment, the patient has consented to  Procedure(s): UPPER ESOPHAGOGASTRODUODENOSCOPY (EGD) (N/A) as a surgical intervention .  The patient's history has been reviewed, patient examined, no change in status, stable for surgery.  I have reviewed the patient's chart and labs.  Questions were answered to the patient's satisfaction.    Leighton Ruff. Redmond Pulling, MD, [place], Bariatric, & Minimally Invasive Surgery [facility], [place]   [facility]

## 2015-08-16 NOTE — H&P (View-Only) (Signed)
Margaret Fletcher 08/02/2015 1:45 PM Location: [facility] Patient #: [id number] DOB: September 12, 1970 Divorced / Language: Margaret Fletcher / Race: Black or African American Female   History of Present Illness Margaret Fletcher M. Margaret Adler MD; 08/04/2015 6:03 PM) The patient is a 45 year old female presenting status-post bariatric surgery. She comes in for follow-up after undergoing laparoscopic Roux-en-Y gastric bypass. She had surgery on May 1. Her preoperative weight was 273 pounds. Her postop course was complicated by being readmitted on May 8 through the 12 for difficulty with oral intake. She described it as epigastric discomfort. A repeat upper GI demonstrated no evidence of leak. There was prompt passage of contrast through her pouch into her Roux limb. Her blood count was normal. She had no fever or tachycardia. There is no suspicion of the postoperative leak. A PICC line was placed and she was sent home with intermittent IV fluid administration 3 times a week. I last saw her last Friday on 5/19. Her weight was 253 lbs. She states that she is doing about same. She is staying on liquids as instructed. Last week the epigastric discomfort was a 5 or 6 now it is a 3-4. She describes it as a upper tummy ache. She did actually have a flare last evening when she was chewing some grilled chicken for flavor and inadvertently swallowed some and it caused significant epigastric pain within 13min of swallowing it. She denies any left upper quadrant all shoulder pain. She denies any fever or chills. She is having flatus and having bowel movements. She is taking her supplements without difficulty. She is alternating between pushing water and pushing protein shakes. She is getting 1 protein shake per day +water. does better with ice. thicker liquids are more challenging. she reports she had labs this past Monday but didn't bring copy of report. Some days are better than others. She denies any heartburn or  indigestion. She is taking Protonix and carafate not religiously. doesn't think it helps.   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) VITAMIN D DEFICIENCY (E55.9) ALOPECIA (L65.9) MILD OBSTRUCTIVE SLEEP APNEA (G47.33)  Other Problems Margaret Curry, MD; 08/04/2015 6:07 PM) Migraine Headache Hemorrhoids Anxiety Disorder  Past Surgical History Margaret Curry, MD; 08/04/2015 6:07 PM) Appendectomy Cesarean Section - Multiple Gallbladder Surgery - Laparoscopic Oral Surgery  Diagnostic Studies History Margaret Curry, MD; 08/04/2015 6:07 PM) Mammogram within last year Colonoscopy never Pap Smear 1-5 years ago  Allergies Margaret Fletcher, [name]; 08/02/2015 2:02 PM) Sulfa Antibiotics  Medication History Margaret Fletcher, [name]; 08/02/2015 2:02 PM) Vitamin D (Ergocalciferol) (50000UNIT Capsule, 1 (one) Capsule Oral once a week, Taken starting 07/03/2015) Active. Zofran ODT (4MG  Tablet Disperse, 1 (one) Oral every six hours, as needed, Taken starting 06/26/2015) Active. Protonix (40MG  Tablet DR, 1 (one) Oral daily, Taken starting 06/26/2015) Active. Biotin Active. Wellbutrin (75MG  Tablet, Oral) Active. Calcium + D (150-261-330MG -MG-IU Capsule, Oral) Active. Vitamin B12 (100MCG Tablet, Oral) Active. Magnesium Carbonate (250MG  Capsule, Oral) Active. Multiple Vitamin (Oral) Active. Vitamin B6 (200MG  Tablet, Oral) Active. Imitrex (25MG  Tablet, Oral) Active. Topiramate (100MG  Tablet, Oral) Active. Medications Reconciled  Social History Margaret Curry, MD; 08/04/2015 6:07 PM) Caffeine use Carbonated beverages, Coffee,  Tea. Alcohol use Occasional alcohol use. Tobacco use Never smoker. No drug use  Family History Margaret Curry, MD; 08/04/2015 6:07 PM) Breast Cancer Mother. Heart disease in female family member before age 47 Diabetes Mellitus Brother. Seizure disorder Son. Heart disease in female family member before age 14 Cerebrovascular Accident Father. Colon Polyps Father.  Pregnancy / Birth History Margaret Curry, MD; 08/04/2015 6:07 PM) [name] 2 Age at menarche 55 years. Irregular periods Para 2 Maternal age 15-25  Vitals Margaret Fletcher [name]; 08/02/2015 2:03 PM) 08/02/2015 2:02 PM Weight: 247 lb Height: 67.5in Body Surface Area: 2.22 m Body Mass Index: 38.11 kg/m  Temp.: 97.81F(Temporal)  Pulse: 98 (Regular)  BP: 132/78 (Sitting, Left Arm, Standard)       Physical Exam Margaret Fletcher M. Jennetta Flood MD; 08/04/2015 6:03 PM) General Mental Status-Alert. General Appearance-Consistent with stated age. Hydration-Well hydrated. Voice-Normal.  Chest and Lung Exam Chest and lung exam reveals -quiet, even and easy respiratory effort with no use of accessory muscles. Inspection Chest Wall - Normal. Back - normal. Auscultation Breath sounds - Normal.  Cardiovascular Auscultation Rhythm - Regular.  Abdomen Inspection Skin - Scar - Note: Incision: clean, dry, and intact. Palpation/Percussion Palpation and Percussion of the abdomen reveal - Soft, Non Tender, No Rebound tenderness and No Rigidity (guarding). Auscultation Auscultation of the abdomen reveals - Bowel sounds normal.    Assessment & Plan Margaret Fletcher M. Keatyn Jawad MD; 08/04/2015 6:06 PM) GASTRIC BYPASS STATUS FOR OBESITY (Z98.84) Impression: She looks well. She does not look dehydrated. I do not have a copy of her labs from this past Monday. We contacted the home health agency and they are supposed to be faxing. However they were not available by the time the patient left the appointment today. She should be  getting somewhat better by now. At this point we discussed proceeding with upper endoscopy however she would like to put off that as long as possible. I told her we may not have an option. I don't think she has a problem at her jejunojejunostomy based on the symptoms that she is telling me however I think it is important to rule out something else like her jejunojejunostomy since her repeat upper GI on her bowels back admission was within normal limits. We will set her up for CT scan for early next week. We also discussed her nutritional status. If her oral intake discomfort does not improve we discussed adding parenteral nutrition and we briefly discussed a temporary feeding tube in her gastric remnant. I will await to review the labs once they are faxed. We will add a prealbumin for her next blood draw. She was instructed on what to call for. Continue weekly lab monitoring. Follow up in 2 weeks or sooner Current Plans Instructed to keep follow-up appointment as scheduled Pt Education - [id number]  Leighton Ruff. Redmond Pulling, MD, FACS General, Bariatric, & Minimally Invasive Surgery [facility], [place]

## 2015-08-16 NOTE — Op Note (Signed)
[facility] Patient Name: Margaret Fletcher Procedure Date: 08/16/2015 MRN: [id number] Attending MD: [name] , MD Date of Birth: August 16, 1970 CSN: [id number] Age: 45 Admit Type: Outpatient Procedure:                Upper GI endoscopy Indications:              Epigastric abdominal pain, s/p laparoscopic roux en                            y gastric bypass 07/08/2015 with ongoing epigastric                            discomfort with eating/drinking Providers:                Leighton Ruff. Redmond Pulling, MD, Carolynn Comment, RN, Cherylynn Ridges, Technician Referring MD:              Medicines:                Fentanyl 100 micrograms IV, Midazolam 6 mg IV,                            Ondansetron 4 mg IV, Cetacaine spray, Monitored                            Anesthesia Care Complications:            No immediate complications. Estimated blood loss:                            None. Estimated Blood Loss:     Estimated blood loss: none. Procedure:                Pre-Anesthesia Assessment:                           - Prior to the procedure, a History and Physical                            was performed, and patient medications and                            allergies were reviewed. The patient is competent.                            The risks and benefits of the procedure and the                            sedation options and risks were discussed with the                            patient. All questions were answered and informed                            consent  was obtained. Patient identification and                            proposed procedure were verified by the physician,                            the nurse and the technician in the endoscopy                            suite. Mental Status Examination: alert and                            oriented. Airway Examination: normal oropharyngeal                            airway and neck mobility and Mallampati  Class I                            (tonsillar pillars visualized). Respiratory                            Examination: clear to auscultation. CV Examination:                            normal. Prophylactic Antibiotics: The patient does                            not require prophylactic antibiotics. Prior                            Anticoagulants: The patient has taken no previous                            anticoagulant or antiplatelet agents. ASA Grade                            Assessment: II - A patient with mild systemic                            disease. After reviewing the risks and benefits,                            the patient was deemed in satisfactory condition to                            undergo the procedure. The anesthesia plan was to                            use moderate sedation / analgesia (conscious                            sedation). Immediately prior to administration of  medications, the patient was re-assessed for                            adequacy to receive sedatives. The heart rate,                            respiratory rate, oxygen saturations, blood                            pressure, adequacy of pulmonary ventilation, and                            response to care were monitored throughout the                            procedure. The physical status of the patient was                            re-assessed after the procedure.                           After obtaining informed consent, the endoscope was                            passed under direct vision. Throughout the                            procedure, the patient's blood pressure, pulse, and                            oxygen saturations were monitored continuously. The                            [id number] scope was introduced through the                            mouth, and advanced to the afferent and efferent                            jejunal loops. The  upper GI endoscopy was                            accomplished without difficulty. The patient                            tolerated the procedure well. Scope In: Scope Out: Findings:      The esophagus was normal.      some mild reflux in distal esophagus; pouch appeared normal size,       stomach pouch mucosa no ulcers, no hyperemic mucosa, pouch mucosa may be       a little thickened/perhaps pale. 2 visible staples at gastro-jejunostomy       - surrounding mucosa intact. anastomosis widely patent; able to advance       to both efferent & afferent roux limb. Impression:               -  No specimens collected.                           - some mild distal esophagitis                           patent roux en y gastrojejunostomy                           2 visible staples at anastomosis but surrounding                            mucos appeared normal                           m Moderate Sedation:      Moderate (conscious) sedation was administered by the endoscopy nurse       and supervised by the endoscopist. The following parameters were       monitored: oxygen saturation, heart rate, blood pressure, and response       to care. Total physician intraservice time was 6 minutes. Recommendation:           - Admit the patient to hospital ward for                            observation.                           - Bariatric full liquid diet [Duration].                           - Use Protonix (pantoprazole) 40 mg PO BID daily.                           - Use sucralfate suspension 1 gram PO QID daily.                           - Continue present medications. Procedure Code(s):        --- Professional ---                           [phone], Esophagogastroduodenoscopy, flexible,                            transoral; diagnostic, including collection of                            specimen(s) by brushing or washing, when performed                            (separate procedure) Diagnosis Code(s):         --- Professional ---                           R10.13, Epigastric pain CPT copyright 2016 American Medical Association. All rights reserved. The codes documented in this report are preliminary and upon coder review may  be revised to meet  current compliance requirements. Greer Pickerel, MD [name], MD 08/16/2015 4:45:20 PM This report has been signed electronically. Number of Addenda: 0

## 2015-08-17 DIAGNOSIS — E669 Obesity, unspecified: Secondary | ICD-10-CM | POA: Diagnosis not present

## 2015-08-17 DIAGNOSIS — K210 Gastro-esophageal reflux disease with esophagitis: Secondary | ICD-10-CM | POA: Diagnosis not present

## 2015-08-17 DIAGNOSIS — G4733 Obstructive sleep apnea (adult) (pediatric): Secondary | ICD-10-CM | POA: Diagnosis not present

## 2015-08-17 DIAGNOSIS — R1013 Epigastric pain: Secondary | ICD-10-CM | POA: Diagnosis not present

## 2015-08-17 DIAGNOSIS — E441 Mild protein-calorie malnutrition: Secondary | ICD-10-CM | POA: Diagnosis not present

## 2015-08-17 DIAGNOSIS — Z9884 Bariatric surgery status: Secondary | ICD-10-CM | POA: Diagnosis not present

## 2015-08-17 DIAGNOSIS — G932 Benign intracranial hypertension: Secondary | ICD-10-CM | POA: Diagnosis not present

## 2015-08-17 DIAGNOSIS — Z6836 Body mass index (BMI) 36.0-36.9, adult: Secondary | ICD-10-CM | POA: Diagnosis not present

## 2015-08-17 DIAGNOSIS — F329 Major depressive disorder, single episode, unspecified: Secondary | ICD-10-CM | POA: Diagnosis not present

## 2015-08-17 LAB — URINALYSIS, ROUTINE W REFLEX MICROSCOPIC
Bilirubin Urine: NEGATIVE
Glucose, UA: NEGATIVE mg/dL
Ketones, ur: 15 mg/dL — AB
Leukocytes, UA: NEGATIVE
NITRITE: NEGATIVE
PH: 7 (ref 5.0–8.0)
Protein, ur: NEGATIVE mg/dL
SPECIFIC GRAVITY, URINE: 1.015 (ref 1.005–1.030)

## 2015-08-17 LAB — URINE MICROSCOPIC-ADD ON: RBC / HPF: NONE SEEN RBC/hpf (ref 0–5)

## 2015-08-17 NOTE — Progress Notes (Signed)
1 Day Post-Op  Subjective: Complains of upper abdominal discomfort. Not able to tolerate liquid diet yet  Objective: Vital signs in last 24 hours: Temp:  [98.4 F (36.9 C)-98.9 F (37.2 C)] 98.5 F (36.9 C) (06/10 0549) Pulse Rate:  [57-79] 66 (06/10 0549) Resp:  [12-17] 15 (06/10 0549) BP: (98-177)/(61-115) 98/61 mmHg (06/10 0549) SpO2:  [95 %-100 %] 100 % (06/10 0549) Weight:  [109.2 kg (240 lb 11.9 oz)] 109.2 kg (240 lb 11.9 oz) (06/09 1735) Last BM Date: 08/16/15  Intake/Output from previous day: 06/09 0701 - 06/10 0700 In: -  Out: 350 [Urine:350] Intake/Output this shift: Total I/O In: -  Out: 350 [Urine:350]  Resp: clear to auscultation bilaterally Cardio: regular rate and rhythm GI: soft, mild epigastric tenderness. incisions look good  Lab Results:  No results for input(s): WBC, HGB, HCT, PLT in the last 72 hours. BMET  Recent Labs  08/16/15 1730  NA 140  K 3.4*  CL 111  CO2 23  GLUCOSE 77  BUN 15  CREATININE 1.08*  CALCIUM 8.5*   PT/INR No results for input(s): LABPROT, INR in the last 72 hours. ABG No results for input(s): PHART, HCO3 in the last 72 hours.  Invalid input(s): PCO2, PO2  Studies/Results: No results found.  Anti-infectives: Anti-infectives    None      Assessment/Plan: s/p Procedure(s): UPPER ESOPHAGOGASTRODUODENOSCOPY (EGD) (N/A) Advance diet. Encourage bariatric diet Not able to go home yet     TOTH III,PAUL S 08/17/2015

## 2015-08-18 DIAGNOSIS — Z9884 Bariatric surgery status: Secondary | ICD-10-CM | POA: Diagnosis not present

## 2015-08-18 DIAGNOSIS — F329 Major depressive disorder, single episode, unspecified: Secondary | ICD-10-CM | POA: Diagnosis not present

## 2015-08-18 DIAGNOSIS — E441 Mild protein-calorie malnutrition: Secondary | ICD-10-CM | POA: Diagnosis not present

## 2015-08-18 DIAGNOSIS — Z6836 Body mass index (BMI) 36.0-36.9, adult: Secondary | ICD-10-CM | POA: Diagnosis not present

## 2015-08-18 DIAGNOSIS — G4733 Obstructive sleep apnea (adult) (pediatric): Secondary | ICD-10-CM | POA: Diagnosis not present

## 2015-08-18 DIAGNOSIS — G932 Benign intracranial hypertension: Secondary | ICD-10-CM | POA: Diagnosis not present

## 2015-08-18 DIAGNOSIS — E669 Obesity, unspecified: Secondary | ICD-10-CM | POA: Diagnosis not present

## 2015-08-18 DIAGNOSIS — K210 Gastro-esophageal reflux disease with esophagitis: Secondary | ICD-10-CM | POA: Diagnosis not present

## 2015-08-18 DIAGNOSIS — R1013 Epigastric pain: Secondary | ICD-10-CM | POA: Diagnosis not present

## 2015-08-18 LAB — GLUCOSE, CAPILLARY: Glucose-Capillary: 94 mg/dL (ref 65–99)

## 2015-08-18 LAB — URINE CULTURE: Culture: NO GROWTH

## 2015-08-18 NOTE — Progress Notes (Signed)
Patient informed RN she is feeling shaky, wondering if her blood sugar is low as this hs happened at home.  CBG check revealed blood sugar of 94. Will continue to monitor.

## 2015-08-18 NOTE — Progress Notes (Signed)
2 Days Post-Op  Subjective: She has taken very little orally since yesterday.  Objective: Vital signs in last 24 hours: Temp:  [97.9 F (36.6 C)-99 F (37.2 C)] 97.9 F (36.6 C) (06/11 0210) Pulse Rate:  [56-68] 56 (06/11 0210) Resp:  [16] 16 (06/11 0210) BP: (104-112)/(61-76) 111/61 mmHg (06/11 0210) SpO2:  [100 %] 100 % (06/11 0210) Last BM Date: 08/16/15  Intake/Output from previous day: 06/10 0701 - 06/11 0700 In: 2518.9 [I.V.:2518.9] Out: 2250 [Urine:2250] Intake/Output this shift:    Resp: clear to auscultation bilaterally Cardio: regular rate and rhythm GI: soft, mild epigastric tenderness  Lab Results:  No results for input(s): WBC, HGB, HCT, PLT in the last 72 hours. BMET  Recent Labs  08/16/15 1730  NA 140  K 3.4*  CL 111  CO2 23  GLUCOSE 77  BUN 15  CREATININE 1.08*  CALCIUM 8.5*   PT/INR No results for input(s): LABPROT, INR in the last 72 hours. ABG No results for input(s): PHART, HCO3 in the last 72 hours.  Invalid input(s): PCO2, PO2  Studies/Results: No results found.  Anti-infectives: Anti-infectives    None      Assessment/Plan: s/p Procedure(s): UPPER ESOPHAGOGASTRODUODENOSCOPY (EGD) (N/A) Encourage oral protein drinks  She was on IVF at home. Will discuss plan with Dr. Redmond Pulling and maybe try to get her home later today     TOTH III,PAUL S 08/18/2015

## 2015-08-18 NOTE — Care Management Note (Signed)
Case Management Note  Patient Details  Name: Margaret Fletcher MRN: [id number] Date of Birth: Aug 15, 1970  Subjective/Objective:    Gastric bypass surgery 07/2015, poor oral intake                Action/Plan: Discharge Planning:   NCM spoke to pt and states she is active with [facility] for RN and IV fluids. Will need resumption of care orders. Pt may need TPN at dc. Waiting final recommendations for home. Will need Rx for TPN to be faxed to [facility] if pt's dc home today.   PCP- [name] MD  Expected Discharge Date:  08/18/2015               Expected Discharge Plan:  [place]  In-House Referral:  NA  Discharge planning Services  CM Consult  Post Acute Care Choice:  Home Health, Resumption of Svcs/PTA Provider Choice offered to:  Patient  DME Arranged:  N/A DME Agency:  NA  HH Arranged:  RN [place] Agency:  [place]  Status of Service:  In process, will continue to follow  Medicare Important Message Given:    Date Medicare IM Given:    Medicare IM give by:    Date Additional Medicare IM Given:    Additional Medicare Important Message give by:     If discussed at [place] of Stay Meetings, dates discussed:    Additional Comments:  Erenest Rasher, RN 08/18/2015, 1:17 PM

## 2015-08-19 ENCOUNTER — Encounter (HOSPITAL_COMMUNITY): Payer: Self-pay | Admitting: General Surgery

## 2015-08-19 DIAGNOSIS — E669 Obesity, unspecified: Secondary | ICD-10-CM | POA: Diagnosis not present

## 2015-08-19 DIAGNOSIS — Z7901 Long term (current) use of anticoagulants: Secondary | ICD-10-CM | POA: Diagnosis not present

## 2015-08-19 DIAGNOSIS — Z7951 Long term (current) use of inhaled steroids: Secondary | ICD-10-CM | POA: Diagnosis not present

## 2015-08-19 DIAGNOSIS — Z9884 Bariatric surgery status: Secondary | ICD-10-CM | POA: Diagnosis not present

## 2015-08-19 DIAGNOSIS — G932 Benign intracranial hypertension: Secondary | ICD-10-CM | POA: Diagnosis not present

## 2015-08-19 DIAGNOSIS — E441 Mild protein-calorie malnutrition: Secondary | ICD-10-CM | POA: Diagnosis not present

## 2015-08-19 DIAGNOSIS — F329 Major depressive disorder, single episode, unspecified: Secondary | ICD-10-CM | POA: Diagnosis not present

## 2015-08-19 DIAGNOSIS — K210 Gastro-esophageal reflux disease with esophagitis: Secondary | ICD-10-CM | POA: Diagnosis not present

## 2015-08-19 DIAGNOSIS — E440 Moderate protein-calorie malnutrition: Secondary | ICD-10-CM | POA: Diagnosis present

## 2015-08-19 DIAGNOSIS — Z6836 Body mass index (BMI) 36.0-36.9, adult: Secondary | ICD-10-CM | POA: Diagnosis not present

## 2015-08-19 DIAGNOSIS — Z452 Encounter for adjustment and management of vascular access device: Secondary | ICD-10-CM | POA: Diagnosis not present

## 2015-08-19 DIAGNOSIS — G4733 Obstructive sleep apnea (adult) (pediatric): Secondary | ICD-10-CM | POA: Diagnosis not present

## 2015-08-19 DIAGNOSIS — R1013 Epigastric pain: Secondary | ICD-10-CM | POA: Diagnosis not present

## 2015-08-19 DIAGNOSIS — E860 Dehydration: Secondary | ICD-10-CM | POA: Diagnosis not present

## 2015-08-19 MED ORDER — OXYCODONE HCL 5 MG/5ML PO SOLN: 2.5000 mg | ORAL | Status: DC | PRN

## 2015-08-19 NOTE — Progress Notes (Signed)
Discussed ongoing issues with PO intake, offered suggestions for trying different items.  Discussed patients anxiety issues, and suggested the possibility of anxiety increasing nausea and distaste for foods (as well as smells).  Discussed a game plan for getting in fluids and proteins.  Using Carafate, oxycodone and home prescription for anti-anxiety medications together to assist with PO intake.  Asked patient for dream scenario of what she might like to drink -- she stated Peach or Mango Tea, I purchased unflavored protein powder as well as several fruit tea blends (sugar free) for patient to try and see if PO intake of fluids could improve.  Spoke with patient about the importance of having variety (just because it worked today does not mean it will tomorrow) and not letting herself get tired of certain flavors.  At the bedside with sugar free, decaffeinated teas patient is able to sip and believes that she will be able to drink.

## 2015-08-19 NOTE — Progress Notes (Signed)
Initial Nutrition Assessment  DOCUMENTATION CODES:   Severe malnutrition in context of acute illness/injury  INTERVENTION:   Recommend once TPN is started to monitor Potassium, Magnesium and Phosphorus levels as patient is at risk of refeeding syndrome given poor PO intake since bariatric surgery.  Estimated needs provided below for home TPN RD to continue to monitor for additional needs  NUTRITION DIAGNOSIS:   Inadequate oral intake related to altered GI function as evidenced by energy intake < or equal to 75% for > or equal to 1 month.  GOAL:   Patient will meet greater than or equal to 90% of their needs  MONITOR:   PO intake, Supplement acceptance, Labs, Weight trends, I & O's, Other (Comment) (TPN)  REASON FOR ASSESSMENT:   Consult New TPN/TNA  ASSESSMENT:   45 year old female presenting status-post bariatric surgery. She comes in for follow-up after undergoing laparoscopic Roux-en-Y gastric bypass. She had surgery on May 1. Her preoperative weight was 273 pounds. Her postop course was complicated by being readmitted on May 8 through the 12 for difficulty with oral intake. She described it as epigastric discomfort. A repeat upper GI demonstrated no evidence of leak.   RD notified by Pharmacist that pt is to discharge home today. She will start TPN at home with the assistance of [facility]. Pt with many questions during RD visit. PT has already been visited by Bariatric Nurse Coordinator. Pt states that on a "good day" she would only consume 1 protein shake and some water or diet green tea. She did at one time advance to soft protein solids like eggs but states that solids caused pain so she did not consume a lot.  Nichols visited patient while RD was in room. Pt had many questions regarding care once she was at home, how much she could consume orally and what vitamins to take.  Pt states that she was told to try and work up to drinking 3 Premier Protein shakes a day (provides 480  kcal, 90g protein).  Recommend monitoring for refeeding syndrome given patient's poor PO intake PTA for the past 6 weeks.  Since gastric bypass surgery on 5/1, patient has lost 27 lb. (10% wt loss x 1.5 months, significant for time frame). Pt with no physical signs of malnutrition.  Medications: Multivitamin liquid BID, Carafate QID, D5 and .45% NaCl w/ KCl 20 infusion @ 75 ml/hr -provides 306 kcal  Labs reviewed: Low K  Diet Order:     Skin:  Reviewed, no issues  Last BM:  6/9  Height:   Ht Readings from Last 1 Encounters:  08/16/15 5\' 8"  (1.727 m)    Weight:   Wt Readings from Last 1 Encounters:  08/16/15 240 lb 11.9 oz (109.2 kg)    Ideal Body Weight:  63.6 kg  BMI:  Body mass index is 36.61 kg/(m^2).  Estimated Nutritional Needs:   Kcal:  1800-2000  Protein:  85-95g  Fluid:  1.8-2L/day  EDUCATION NEEDS:   Education needs addressed  Clayton Bibles, MS, RD, LDN Pager: [phone] After Hours Pager: [phone]

## 2015-08-19 NOTE — Discharge Instructions (Signed)
CONTINUE TO DRINK LIQUIDS (CLEARS, FULL LIQUIDS, PROTEIN SHAKES), no limit by mouth Would rather you drink liquids/work on getting liquids in before eating solids. Can try SOFT protein solids if have done well with liquid that day Can try premedicating oral intake with carafate and SMALL dose of oxycodone (2.5mg ) prior to liquid intake  Will arrange home TPN   GASTRIC BYPASS/SLEEVE  Home Care Instructions   These instructions are to help you care for yourself when you go home.  Call: If you have any problems.  Call [phone] and ask for the surgeon on call  If you need immediate assistance come to the ER at [facility]. Tell the ER staff you are a new post-op gastric bypass or gastric sleeve patient  Signs and symptoms to report:  Severe  vomiting or nausea o If you cannot handle clear liquids for longer than 1 day, call your surgeon  Abdominal pain which does not get better after taking your pain medication  Fever greater than 100.4  F and chills  Heart rate over 100 beats a minute  Trouble breathing  Chest pain  Redness,  swelling, drainage, or foul odor at incision (surgical) sites  If your incisions open or pull apart  Swelling or pain in calf (lower leg)  Diarrhea (Loose bowel movements that happen often), frequent watery, uncontrolled bowel movements  Constipation, (no bowel movements for 3 days) if this happens: o Take Milk of Magnesia, 2 tablespoons by mouth, 3 times a day for 2 days if needed o Stop taking Milk of Magnesia once you have had a bowel movement o Call your doctor if constipation continues Or o Take Miralax  (instead of Milk of Magnesia) following the label instructions o Stop taking Miralax once you have had a bowel movement o Call your doctor if constipation continues  Anything you think is abnormal for you   Normal side effects after surgery:  Unable to sleep at night or unable to concentrate  Irritability  Being tearful (crying)  or depressed  These are common complaints, possibly related to your anesthesia, stress of surgery, and change in lifestyle, that usually go away a few weeks after surgery. If these feelings continue, call your medical doctor.  Wound Care: You may have surgical glue, steri-strips, or staples over your incisions after surgery  Surgical glue: Looks like clear film over your incisions and will wear off a little at a time  Steri-strips: Adhesive strips of tape over your incisions. You may notice a yellowish color on skin under the steri-strips. This is used to make the steri-strips stick better. Do not pull the steri-strips off - let them fall off  Staples: Staples may be removed before you leave the hospital o If you go home with staples, call [facility] for an appointment with your surgeons nurse to have staples removed 10 days after surgery, [phone]  Showering: You may shower two (2) days after your surgery unless your surgeon tells you differently o Wash gently around incisions with warm soapy water, rinse well, and gently pat dry o If you have a drain (tube from your incision), you may need someone to hold this while you shower o No tub baths until staples are removed and incisions are healed   Medications:  Medications should be liquid or crushed if larger than the size of a dime  Extended release pills (medication that releases a little bit at a time through the  day) should not be crushed  Depending  on the size and number of medications you take, you may need to space (take a few throughout the day)/change the time you take your medications so that you do not over-fill your pouch (smaller stomach)  Make sure you follow-up with you primary care physician to make medication changes needed during rapid weight loss and life -style changes  If you have diabetes, follow up with your doctor that orders your diabetes medication(s) within one week after surgery and check  your blood sugar regularly   Do not drive while taking narcotics (pain medications)   Do not take acetaminophen (Tylenol) and Roxicet or Lortab Elixir at the same time since these pain medications contain acetaminophen   Diet:  First 2 Weeks You will see the nutritionist about two (2) weeks after your surgery. The nutritionist will increase the types of foods you can eat if you are handling liquids well:  If you have severe vomiting or nausea and cannot handle clear liquids lasting longer than 1 day call your surgeon Protein Shake  Drink at least 2 ounces of shake 5-6 times per day  Each serving of protein shakes (usually 8-12 ounces) should have a minimum of: o 15 grams of protein o And no more than 5 grams of carbohydrate  Goal for protein each day: o Men = 80 grams per day o Women = 60 grams per day     Protein powder may be added to fluids such as non-fat milk or Lactaid milk or Soy milk (limit to 35 grams added protein powder per serving)  Hydration  Slowly increase the amount of water and other clear liquids as tolerated (See Acceptable Fluids)  Slowly increase the amount of protein shake as tolerated  Sip fluids slowly and throughout the day  May use sugar substitutes in small amounts (no more than 6-8 packets per day; i.e. Splenda)  Fluid Goal  The first goal is to drink at least 8 ounces of protein shake/drink per day (or as directed by the nutritionist); some examples of protein shakes are [employer], [employer], EAS Edge HP, and Unjury. - See handout from pre-op Bariatric Education Class: o Slowly increase the amount of protein shake you drink as tolerated o You may find it easier to slowly sip shakes throughout the day o It is important to get your proteins in first  Your fluid goal is to drink 64-100 ounces of fluid daily o It may take a few weeks to build up to this   32 oz. (or more) should be clear liquids And  32 oz. (or more) should be  full liquids (see below for examples)  Liquids should not contain sugar, caffeine, or carbonation  Clear Liquids:  Water of Sugar-free flavored water (i.e. Fruit HO, Propel)  Decaffeinated coffee or tea (sugar-free)  Crystal lite, Wylers Lite, Minute Maid Lite  Sugar-free Jell-O  Bouillon or broth  Sugar-free Popsicle:    - Less than 20 calories each; Limit 1 per day  Full Liquids:                   Protein Shakes/Drinks + 2 choices per day of other full liquids  Full liquids must be: o No More Than 12 grams of Carbs per serving o No More Than 3 grams of Fat per serving  Strained low-fat cream soup  Non-Fat milk  Fat-free Lactaid Milk  Sugar-free yogurt (Dannon Lite & Fit, Greek yogurt)    Vitamins and Minerals  Start 1 day after  surgery unless otherwise directed by your surgeon  2 Chewable Multivitamin / Multimineral Supplement with iron (i.e. Centrum for Adults)  Vitamin B-12, 350-500 micrograms sub-lingual (place tablet under the tongue) each day  Chewable Calcium Citrate with Vitamin D-3 (Example: 3 Chewable Calcium  Plus 600 with Vitamin D-3) o Take 500 mg three (3) times a day for a total of 1500 mg each day o Do not take all 3 doses of calcium at one time as it may cause constipation, and you can only absorb 500 mg at a time o Do not mix multivitamins containing iron with calcium supplements;  take 2 hours apart o Do not substitute Tums (calcium carbonate) for your calcium  Menstruating women and those at risk for anemia ( a blood disease that causes weakness) may need extra iron o Talk to your doctor to see if you need more iron  If you need extra iron: Total daily Iron recommendation (including Vitamins) is 50 to 100 mg Iron/day  Do not stop taking or change any vitamins or minerals until you talk to your nutritionist or surgeon  Your nutritionist and/or surgeon must approve all vitamin and mineral supplements   Activity and Exercise: It is  important to continue walking at home. Limit your physical activity as instructed by your doctor. During this time, use these guidelines:  Do not lift anything greater than ten  (10) pounds for at least two (2) weeks  Do not go back to work or drive until Engineer, production says you can  You may have sex when you feel comfortable o It is VERY important for female patients to use a reliable birth control method; fertility often increase after surgery o Do not get pregnant for at least 18 months  Start exercising as soon as your doctor tells you that you can o Make sure your doctor approves any physical activity  Start with a simple walking program  Walk 5-15 minutes each day, 7 days per week  Slowly increase until you are walking 30-45 minutes per day  Consider joining our [place] program. [phone] or email [email or web address]   Special Instructions Things to remember:  Free counseling is available for you and your family through collaboration between [facility] and [place]. Please call [phone] and leave a message  Use your CPAP when sleeping if this applies to you  Consider buying a medical alert bracelet that says you had lap-band surgery     You will likely have your first fill (fluid added to your band) 6 - 8 weeks after surgery  [facility] has a free Bariatric Surgery Support Group that meets monthly, the 3rd Thursday, [place]. You can see classes online at [email or web address]  It is very important to keep all follow up appointments with your surgeon, nutritionist, primary care physician, and behavioral health practitioner o After the first year, please follow up with your bariatric surgeon and nutritionist at least once a year in order to maintain best weight loss results                    [facility]:  [place]: [phone]                Bariatric Nurse Coordinator: [phone]  Gastric Bypass/Sleeve Home Care Instructions  Rev. 04/2012  Reviewed and Endorsed                                                    by [facility] Patient Education Committee, Jan, 2014

## 2015-08-19 NOTE — Progress Notes (Signed)
Patient discharged home, all discharge medications and instructions reviewed and questions answered. Patient to be assisted to vehicle by wheelchair.  

## 2015-08-19 NOTE — Progress Notes (Signed)
Margaret Fletcher is prepared to take Margaret Fletcher home today to start TNA at home this evening.  Dr. Redmond Pulling has made contact with Margaret Fletcher, RPh with [facility] TNA Pharmacy Team to discuss pt needs and POC for home.  Pt is aware of plan for initiation of TNA at home later today.  If patient discharges after hours, please call [phone].   Larry Sierras 08/19/2015, 10:20 AM

## 2015-08-19 NOTE — Progress Notes (Signed)
3 Days Post-Op  Subjective: Still with epigastric discomfort/fullness with PO intake. Sat night went well with po intake - premedicated with carafate and small dose of oxy and was able to drink 2 medicine cups of shake with minimal discomfort  Objective: Vital signs in last 24 hours: Temp:  [98.2 F (36.8 C)-98.8 F (37.1 C)] 98.2 F (36.8 C) (06/12 0553) Pulse Rate:  [65-74] 74 (06/12 0553) Resp:  [16] 16 (06/12 0553) BP: (102-127)/(56-83) 102/56 mmHg (06/12 0553) SpO2:  [99 %-100 %] 99 % (06/12 0553) Last BM Date: 08/16/15  Intake/Output from previous day: 06/11 0701 - 06/12 0700 In: 1247.7 [I.V.:1247.7] Out: 1750 [Urine:1750] Intake/Output this shift:    Alert, approp symm chest rise Reg Soft, nt  Lab Results:  No results for input(s): WBC, HGB, HCT, PLT in the last 72 hours. BMET  Recent Labs  08/16/15 1730  NA 140  K 3.4*  CL 111  CO2 23  GLUCOSE 77  BUN 15  CREATININE 1.08*  CALCIUM 8.5*   PT/INR No results for input(s): LABPROT, INR in the last 72 hours. ABG No results for input(s): PHART, HCO3 in the last 72 hours.  Invalid input(s): PCO2, PO2  Studies/Results: No results found.  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 Fortunately no sign of obstruction, leak, ulcer.   Had long talk with pt. i believe her discomfort will get better with time. Have seen this a few times in past.  Right now would rather her push liquids as I think this will be easier source of protein than solids.  Goal over next several weeks would be to cont to work up to 50-60 oz liquid per day  Only last part of w/u would be diagnostic laparoscopy, possible g tube in excluded stomach but I would like to hold off on that for time  being  Will plan on dc today Will plan home TPN - spoke with pharmacist at [name], who requested a TPN per pharmacy consult as this would be helpful to figure out TPN formula.   Leighton Ruff. Redmond Pulling, MD, FACS General, Bariatric, & Minimally Invasive Surgery [facility], [place]      [facility] Jerilynn Mages 08/19/2015

## 2015-08-19 NOTE — Progress Notes (Signed)
PARENTERAL NUTRITION CONSULT NOTE - FOLLOW UP  Pharmacy Consult for TPN Indication: inadequate oral intake  Allergies  Allergen Reactions  . Sulfa Antibiotics Anaphylaxis    Patient Measurements: Height: 5\' 8"  (172.7 cm) Weight: 240 lb 11.9 oz (109.2 kg) IBW/kg (Calculated) : 63.9 Adjusted Body Weight:  Usual Weight:   Vital Signs: Temp: 98.2 F (36.8 C) (06/12 0553) Temp Source: Oral (06/12 0553) BP: 102/56 mmHg (06/12 0553) Pulse Rate: 74 (06/12 0553) Intake/Output from previous day: 06/11 0701 - 06/12 0700 In: 1247.7 [I.V.:1247.7] Out: 1750 [Urine:1750] Intake/Output from this shift:    Labs: No results for input(s): WBC, HGB, HCT, PLT, APTT, INR in the last 72 hours.   Recent Labs  08/16/15 1730  NA 140  K 3.4*  CL 111  CO2 23  GLUCOSE 77  BUN 15  CREATININE 1.08*  CALCIUM 8.5*  PROT 6.2*  ALBUMIN 3.3*  AST 18  ALT 16  ALKPHOS 47  BILITOT 0.6  PREALBUMIN 14.7*   Estimated Creatinine Clearance: 85.2 mL/min (by C-G formula based on Cr of 1.08).    Recent Labs  08/18/15 0855  GLUCAP 94    Insulin Requirements: no insulin  Current Nutrition: protein supplement QID  IVF: D5 0.45% NaCl with 20 mEq/L KCL at 75 ml/hr  Central access: PICC 5/11  TPN start date:   ASSESSMENT                                                                                                          HPI: Patient is a 45 y.o F s/p bariatric surgery on 08/08/15, admitted on 08/15/15 d/t poor oral intake and epigastric pain -- no post-op leak noted and she was subsequently discharged home on 08/19/15 with PICC and IV fluid admistration three times a week. She presented back to the hospital with c/o epigastric discomfort.  Upper endoscopy on 6/9 showed normal esophagus was normal with some mild reflux in distal esophagus. Plan is to discharge patient and start TPN outpatient for poor oral intake.  Pharmacy is asked to make recommendations regarding her nutritional  needs.  Significant events:   Today:    Glucose: wnl  Electrolytes: slightly low K on 6/9  Renal: wnl (69/)  LFTs: wnl (6/9)  TGs: not ordered yet  Prealbumin: 14.7 (6/9)  NUTRITIONAL GOALS                                                                                             RD recs: - monitor K, Mag and Phos closely once TPN is started d/t risk for refeeding syndrome Kcal: 1800-2000 Protein: 85-95g Fluid: 1.8-2L/day  PLAN                                                                                                                         -  Spoke to [employer] from [place].  She informed me that since our inpatient pharmacy has premixed Clinimix and Advanced compounds their TPN, their pharmacist Vinnie Level) will dose patient's TPN based above recommendations from dietician. - Will f/u with patient in AM if she's still here.  Jotham Ahn P 08/19/2015,10:37 AM

## 2015-08-20 DIAGNOSIS — E860 Dehydration: Secondary | ICD-10-CM | POA: Diagnosis not present

## 2015-08-20 DIAGNOSIS — R1013 Epigastric pain: Secondary | ICD-10-CM

## 2015-08-20 DIAGNOSIS — Z452 Encounter for adjustment and management of vascular access device: Secondary | ICD-10-CM | POA: Diagnosis not present

## 2015-08-20 DIAGNOSIS — Z7951 Long term (current) use of inhaled steroids: Secondary | ICD-10-CM | POA: Diagnosis not present

## 2015-08-20 DIAGNOSIS — Z7901 Long term (current) use of anticoagulants: Secondary | ICD-10-CM | POA: Diagnosis not present

## 2015-08-20 DIAGNOSIS — G8929 Other chronic pain: Secondary | ICD-10-CM | POA: Diagnosis present

## 2015-08-21 ENCOUNTER — Emergency Department (HOSPITAL_COMMUNITY)
Admission: EM | Admit: 2015-08-21 | Discharge: 2015-08-21 | Disposition: A | Discharge status: Home / Self Care | Payer: 59 | Attending: Emergency Medicine | Admitting: Emergency Medicine

## 2015-08-21 ENCOUNTER — Encounter (HOSPITAL_COMMUNITY): Payer: Self-pay

## 2015-08-21 DIAGNOSIS — R899 Unspecified abnormal finding in specimens from other organs, systems and tissues: Secondary | ICD-10-CM

## 2015-08-21 DIAGNOSIS — F329 Major depressive disorder, single episode, unspecified: Secondary | ICD-10-CM | POA: Insufficient documentation

## 2015-08-21 DIAGNOSIS — Z79899 Other long term (current) drug therapy: Secondary | ICD-10-CM | POA: Diagnosis not present

## 2015-08-21 DIAGNOSIS — R7989 Other specified abnormal findings of blood chemistry: Secondary | ICD-10-CM | POA: Insufficient documentation

## 2015-08-21 DIAGNOSIS — R799 Abnormal finding of blood chemistry, unspecified: Secondary | ICD-10-CM | POA: Diagnosis not present

## 2015-08-21 LAB — COMPREHENSIVE METABOLIC PANEL
ALBUMIN: 3.8 g/dL (ref 3.5–5.0)
ALT: 22 U/L (ref 14–54)
AST: 25 U/L (ref 15–41)
Alkaline Phosphatase: 57 U/L (ref 38–126)
Anion gap: 6 (ref 5–15)
BUN: 21 mg/dL — AB (ref 6–20)
CHLORIDE: 111 mmol/L (ref 101–111)
CO2: 20 mmol/L — ABNORMAL LOW (ref 22–32)
Calcium: 9 mg/dL (ref 8.9–10.3)
Creatinine, Ser: 1.24 mg/dL — ABNORMAL HIGH (ref 0.44–1.00)
GFR calc Af Amer: 60 mL/min — ABNORMAL LOW (ref >60)
GFR calc non Af Amer: 52 mL/min — ABNORMAL LOW (ref >60)
GLUCOSE: 85 mg/dL (ref 65–99)
POTASSIUM: 3.5 mmol/L (ref 3.5–5.1)
Sodium: 137 mmol/L (ref 135–145)
Total Bilirubin: 0.7 mg/dL (ref 0.3–1.2)
Total Protein: 7.4 g/dL (ref 6.5–8.1)

## 2015-08-21 LAB — CBG MONITORING, ED: Glucose-Capillary: 84 mg/dL (ref 65–99)

## 2015-08-21 LAB — CBC WITH DIFFERENTIAL/PLATELET
Basophils Absolute: 0.1 10*3/uL (ref 0.0–0.1)
Basophils Relative: 1 %
EOS PCT: 6 %
Eosinophils Absolute: 0.5 10*3/uL (ref 0.0–0.7)
HEMATOCRIT: 40.9 % (ref 36.0–46.0)
Hemoglobin: 13.6 g/dL (ref 12.0–15.0)
LYMPHS ABS: 2 10*3/uL (ref 0.7–4.0)
LYMPHS PCT: 25 %
MCH: 30.3 pg (ref 26.0–34.0)
MCHC: 33.3 g/dL (ref 30.0–36.0)
MCV: 91.1 fL (ref 78.0–100.0)
MONO ABS: 0.7 10*3/uL (ref 0.1–1.0)
Monocytes Relative: 8 %
Neutro Abs: 5 10*3/uL (ref 1.7–7.7)
Neutrophils Relative %: 60 %
PLATELETS: 321 10*3/uL (ref 150–400)
RBC: 4.49 MIL/uL (ref 3.87–5.11)
RDW: 16.2 % — ABNORMAL HIGH (ref 11.5–15.5)
WBC: 8.2 10*3/uL (ref 4.0–10.5)

## 2015-08-21 LAB — POTASSIUM: Potassium: 3.8 mmol/L (ref 3.5–5.1)

## 2015-08-21 MED ORDER — INSULIN ASPART 100 UNIT/ML IV SOLN
10.0000 [IU] | Freq: Once | INTRAVENOUS | Status: DC
  Filled 2015-08-21: qty 0.1

## 2015-08-21 MED ORDER — SODIUM CHLORIDE 0.9 % IV SOLN
INTRAVENOUS | Status: DC
  Administered 2015-08-21: 21:00:00 via INTRAVENOUS

## 2015-08-21 MED ORDER — SODIUM BICARBONATE 8.4 % IV SOLN
50.0000 meq | Freq: Once | INTRAVENOUS | Status: AC
  Administered 2015-08-21: 50 meq via INTRAVENOUS
  Filled 2015-08-21: qty 50

## 2015-08-21 MED ORDER — DEXTROSE 50 % IV SOLN
1.0000 | Freq: Once | INTRAVENOUS | Status: AC
  Administered 2015-08-21: 50 mL via INTRAVENOUS
  Filled 2015-08-21: qty 50

## 2015-08-21 MED ORDER — SODIUM CHLORIDE 0.9 % IV SOLN
1.0000 g | Freq: Once | INTRAVENOUS | Status: DC
  Filled 2015-08-21: qty 10

## 2015-08-21 NOTE — Progress Notes (Signed)
[facility]  Margaret Fletcher is an active TPN pt with [facility].  [facility] TPN RPh has requested a CMP be drawn tonight after earlier results today showed a K+ of 7.2 and A Glucose of 1035.  It is suspected there may have been residual TPN in the line when the labs were drawn and thus the values may be erroneous.  In the interest of Margaret Fletcher safety, [facility] has requested the repeat labs.   If patient discharges after hours, please call [phone].   Larry Sierras 08/21/2015, 7:46 PM

## 2015-08-21 NOTE — ED Notes (Signed)
Pt's home health nurse tried to called the charge nurse but the call was unable to go through

## 2015-08-21 NOTE — Progress Notes (Signed)
[facility] (Duplicate note)  Margaret Fletcher is an active TPN pt with [facility]. [facility] TPN RPh has requested a CMP be drawn tonight after earlier results today showed a K+ of 7.2 and A Glucose of 1035. It is suspected there may have been residual TPN in the line when the labs were drawn and thus the values may be erroneous. In the interest of Margaret Fletcher safety, [facility] has requested the repeat labs.   If patient discharges after hours, please call [phone].   Larry Sierras 08/21/2015, 7:46 PM  If patient discharges after hours, please call [phone].   Larry Sierras 08/21/2015, 7:49 PM

## 2015-08-21 NOTE — ED Notes (Signed)
Pt needs her potassium and glucose checked per her home health nurse, they think that she may have had TPN mixed in and the values were wrong. Pt also needs her bags of TPN mixed by pharmacy.

## 2015-08-21 NOTE — ED Notes (Signed)
Bed: WA17 Expected date:  Expected time:  Means of arrival:  Comments: Hold 

## 2015-08-21 NOTE — Discharge Instructions (Signed)
Please follow-up with her primary care provider. Your potassium here was 3.8. This is within normal limits. You are not Hyperkalemic. Feel better!  Hyperkalemia Hyperkalemia is when you have too much potassium in your blood. Potassium is normally removed (excreted) from your body by your kidneys. If there is too much potassium in your blood, it can affect your heart's ability to function.  CAUSES  Hyperkalemia may be caused by:   Taking in too much potassium. You can do this by:  Using salt substitutes. They contain large amounts of potassium.  Taking potassium supplements.  Eating foods high in potassium.  Excreting too little potassium. This can happen if:  Your kidneys are not working properly. Kidney (renal) disease, including short- or long-term renal failure, is a very common cause of hyperkalemia.  You are taking medicines that lower your excretion of potassium.  You have Addison disease.  You have a urinary tract blockage, such as kidney stones.  You are on treatment to mechanically clean your blood (dialysis) and you skip a treatment.  Releasing a high amount of potassium from your cells into your blood. This can happen with:  Injury to muscles (rhabdomyolysis) or other tissues. Most potassium is stored in your muscles.  Severe burns or infections.  Acidic blood plasma (acidosis). Acidosis can result from many diseases, such as uncontrolled diabetes. RISK FACTORS The most common risk factor of hyperkalemia is kidney disease. Other risk factors of hyperkalemia include:  Addison disease. This is a condition where your glands do not produce enough hormones.  Alcoholism or heavy drug use.   Using certain blood pressure medicines, such as angiotensin-converting enzyme (ACE) inhibitors, angiotensin II receptor blockers (ARBs), or potassium-sparing diuretics such as spironolactone.  Severe injury or burn. SIGNS AND SYMPTOMS  Oftentimes, there are no signs or symptoms  of hyperkalemia. However, when your potassium level becomes high enough, you may experience symptoms such as:  Irregular or very slow heartbeat.  Nausea.  Fatigue.  Tingling of the skin or numbness of the hands or feet.  Muscle weakness.  Fatigue.  Not being able to move (paralysis). You may not have any symptoms of hyperkalemia.  DIAGNOSIS  Hyperkalemia may be diagnosed by:  Physical exam.  Blood tests.  ECG (electrocardiogram).  Discussion of prescription and non-prescription drug use. TREATMENT  Treatment for hyperkalemia is often directed at the underlying cause. In some instances, treatment may include:   Insulin.  Glucose (sugar) and water solution given through a vein (intravenous or IV).  Dialysis.  Medicines to remove the potassium from your body.  Medicines to move calcium from your bloodstream into your tissues. HOME CARE INSTRUCTIONS   Take medicines only as directed by your health care provider.  Do not take any supplements, natural products, herbs, or vitamins without reviewing them with your health care provider. Certain supplements and natural food products can have high amounts of potassium.  Limit your alcohol intake as directed by your health care provider.  Stop illegal drug use. If you need help quitting, ask your health care provider.  Keep all follow-up visits as directed by your health care provider. This is important.  If you have kidney disease, you may need to follow a low potassium diet. A dietitian can help educate you on low potassium foods. SEEK MEDICAL CARE IF:   You notice an irregular or very slow heartbeat.  You feel light-headed.  You feel weak.  You are nauseous.  You have tingling or numbness in your hands or feet.  SEEK IMMEDIATE MEDICAL CARE IF:   You have shortness of breath.  You have chest pain or discomfort.  You pass out.  You have muscle paralysis. MAKE SURE YOU:   Understand these  instructions.  Will watch your condition.  Will get help right away if you are not doing well or get worse.   This information is not intended to replace advice given to you by your health care provider. Make sure you discuss any questions you have with your health care provider.   Document Released: 02/13/2002 Document Revised: 03/16/2014 Document Reviewed: 05/31/2013 Elsevier Interactive Patient Education [employer].

## 2015-08-21 NOTE — ED Notes (Signed)
RN and PA notified of critical potassium value

## 2015-08-21 NOTE — ED Provider Notes (Signed)
CSN: [id number]     Arrival date & time 08/21/15  1916 History   First MD Initiated Contact with Patient 08/21/15 1955     Chief Complaint  Patient presents with  . Abnormal Lab   Margaret Fletcher is a 45 y.o. female who presents to the ED After being sent by her home health nurse who checked her blood work and this revealed hyperkalemia. Patient was recently admitted to the emergency department for failure to thrive and was started on TPN. They checked some blood work through her PICC line and it revealed a potassium of greater than 7 and hyperglycemia. Patient was sent to the emergency department. Patient reports she has been having low appetite. She reports feeling fatigue. She denies any muscle weakness or chest pain or shortness of breath. She reports she has been eating and drinking little recently. She is currently on TPN. Patient denies fevers, chills, chest pain, shortness of breath, abdominal pain, nausea, vomiting, diarrhea, rashes or weakness.   The history is provided by the patient. No language interpreter was used.    Past Medical History  Diagnosis Date  . Alopecia   . Autoimmune disease ([place])   . Bacterial vaginosis   . Frequent UTI   . Cyst of spleen   . Depression   . Hypertrophy of breast   . Depression   . Anxiety   . Migraine headache     denies, ruled out   . Migraine   . IIH (idiopathic intracranial hypertension) 2017   Past Surgical History  Procedure Laterality Date  . Cesarean section    . Cholecystectomy    . Appendectomy    . Cesarean section    . Wisdom tooth extraction    . Tubal ligation    . Dilitation & currettage/hystroscopy with novasure ablation N/A 07/19/2012    Procedure: HYSTEROSCOPY WITH NOVASURE ABLATION;  Surgeon: Farrel Gobble. Harrington Challenger, MD;  Location: [facility];  Service: Gynecology;  Laterality: N/A;  . Endometrial ablation    . Breath tek h pylori N/A 05/21/2014    Procedure: BREATH TEK H PYLORI;  Surgeon: Greer Pickerel, MD;  Location: [facility];  Service: General;  Laterality: N/A;  . Breast reduction surgery Bilateral 03/26/2015    Procedure: BILATERAL BREAST REDUCTION  ;  Surgeon: Youlanda Roys, MD;  Location: [facility];  Service: Plastics;  Laterality: Bilateral;  . Laparoscopic roux-en-y gastric bypass with hiatal hernia repair N/A 07/08/2015    Procedure: LAPAROSCOPIC ROUX-EN-Y GASTRIC BYPASS WITH HIATAL HERNIA REPAIR WITH UPPER ENDOSCOPY;  Surgeon: Greer Pickerel, MD;  Location: [facility];  Service: General;  Laterality: N/A;  . Esophagogastroduodenoscopy N/A 08/16/2015    Procedure: UPPER ESOPHAGOGASTRODUODENOSCOPY (EGD);  Surgeon: Greer Pickerel, MD;  Location: [facility];  Service: General;  Laterality: N/A;   Family History  Problem Relation Age of Onset  . Cancer Mother   . Stroke Father   . Diabetes Brother   . Seizures Son   . Migraines Neg Hx    Social History  Substance Use Topics  . Smoking status: Never Smoker   . Smokeless tobacco: Never Used  . Alcohol Use: Yes     Comment: socially   OB History    No data available     Review of Systems  Constitutional: Positive for fatigue. Negative for fever and chills.  HENT: Negative for congestion and sore throat.   Eyes: Negative for visual disturbance.  Respiratory: Negative for cough, shortness of breath and wheezing.  Cardiovascular: Negative for chest pain and palpitations.  Gastrointestinal: Negative for nausea, vomiting, abdominal pain and diarrhea.  Genitourinary: Negative for dysuria and difficulty urinating.  Musculoskeletal: Negative for myalgias, back pain and neck pain.  Skin: Negative for rash.  Neurological: Negative for syncope, weakness, light-headedness and headaches.      Allergies  Sulfa antibiotics  Home Medications   Prior to Admission medications   Medication Sig Start Date End Date Taking? Authorizing Provider  ALPRAZolam Duanne Moron) 0.5 MG tablet Take 1 tablet (0.5 mg total) by mouth 2 (two) times daily as  needed for anxiety. Can take 1-2 30 minutes before MRI. Do not drive. 07/04/15  Yes Robyn Haber, MD  buPROPion (WELLBUTRIN XL) 300 MG 24 hr tablet Take 1 tablet (300 mg total) by mouth daily. 07/04/15  Yes Robyn Haber, MD  CALCIUM-VITAMIN D PO Take 15 mLs by mouth 3 (three) times daily.   Yes Historical Provider, MD  Cyanocobalamin (VITAMIN B 12 PO) Take 1 tablet by mouth daily.   Yes Historical Provider, MD  ondansetron (ZOFRAN-ODT) 4 MG disintegrating tablet Take 1 tablet (4 mg total) by mouth every 6 (six) hours as needed for nausea. 07/19/15  Yes Greer Pickerel, MD  oxyCODONE (ROXICODONE) 5 MG/5ML solution Take 2.5 mLs (2.5 mg total) by mouth every 4 (four) hours as needed for moderate pain or severe pain (before meals as needed for pain). 08/19/15  Yes Greer Pickerel, MD  pantoprazole (PROTONIX) 40 MG tablet Take 40 mg by mouth daily. 07/19/15  Yes Historical Provider, MD  Pediatric Multiple Vitamins (MULTIVITAMIN) LIQD Take 30 mLs by mouth 2 (two) times daily.   Yes Historical Provider, MD  propranolol (INDERAL) 10 MG tablet Take 1 tablet (10 mg total) by mouth 2 (two) times daily. 07/22/15  Yes Melvenia Beam, MD  sucralfate (CARAFATE) 1 GM/10ML suspension Take 10 mLs (1 g total) by mouth 4 (four) times daily -  with meals and at bedtime. 07/19/15  Yes Greer Pickerel, MD  SUMAtriptan (IMITREX) 100 MG tablet Take 1 tablet (100 mg total) by mouth once as needed for migraine. May repeat in 2 hours if headache persists or recurs. 04/30/15  Yes Melvenia Beam, MD  thiamine 123XX123 mg, folic acid 1 mg, multivitamins adult 10 mL in sodium chloride 0.9 % 1,000 mL Inject 1,000 mLs into the vein once. 1L IV every 3 days; first dose Sunday 07/19/15  Yes Greer Pickerel, MD  topiramate (TOPAMAX) 100 MG tablet Take 2 tablets (200 mg total) by mouth at bedtime. Patient taking differently: Take 200 mg by mouth 2 (two) times daily.  07/22/15  Yes Melvenia Beam, MD   BP 116/86 mmHg  Pulse 83  Temp(Src) 98.9 F (37.2 C)  (Oral)  Resp 18  SpO2 100% Physical Exam  Constitutional: She appears well-developed and well-nourished. No distress.  Nontoxic appearing. PICC line in right upper arm.  HENT:  Head: Normocephalic and atraumatic.  Mouth/Throat: Oropharynx is clear and moist.  Eyes: Conjunctivae are normal. Pupils are equal, round, and reactive to light. Right eye exhibits no discharge. Left eye exhibits no discharge.  Neck: Neck supple.  Cardiovascular: Normal rate, regular rhythm, normal heart sounds and intact distal pulses.  Exam reveals no gallop and no friction rub.   No murmur heard. Bilateral radial pulses are intact.   Pulmonary/Chest: Effort normal and breath sounds normal. No respiratory distress. She has no wheezes. She has no rales.  Abdominal: Soft. She exhibits no distension. There is no tenderness. There is  no guarding.  Musculoskeletal: She exhibits no edema.  Lymphadenopathy:    She has no cervical adenopathy.  Neurological: She is alert. Coordination normal.  Skin: Skin is warm and dry. No rash noted. She is not diaphoretic. No erythema. No pallor.  Psychiatric: She has a normal mood and affect. Her behavior is normal.  Nursing note and vitals reviewed.   ED Course  Procedures (including critical care time) Labs Review Labs Reviewed  COMPREHENSIVE METABOLIC PANEL - Abnormal; Notable for the following:    CO2 20 (*)    BUN 21 (*)    Creatinine, Ser 1.24 (*)    GFR calc non Af Amer 52 (*)    GFR calc Af Amer 60 (*)    All other components within normal limits  CBC WITH DIFFERENTIAL/PLATELET - Abnormal; Notable for the following:    RDW 16.2 (*)    All other components within normal limits  I-STAT CHEM 8, ED - Abnormal; Notable for the following:    Potassium 8.1 (*)    BUN 32 (*)    Creatinine, Ser 1.40 (*)    Calcium, Ion 1.06 (*)    All other components within normal limits  POTASSIUM  CBG MONITORING, ED    Imaging Review No results found. I have personally  reviewed and evaluated these lab results as part of my medical decision-making.   EKG Interpretation      ED ECG REPORT   Date: 08/22/2015  Rate: 64  Rhythm: normal sinus rhythm  QRS Axis: normal  Intervals: normal  ST/T Wave abnormalities: normal  Conduction Disutrbances:none  Narrative Interpretation:   Old EKG Reviewed: unchanged  I have personally reviewed the EKG tracing and agree with the computerized printout as noted.   Filed Vitals:   08/21/15 2130 08/21/15 2245 08/21/15 2300 08/21/15 2311  BP: 103/68  116/86 116/86  Pulse: 75 59  83  Temp:      TempSrc:      Resp: 29 6 0 18  SpO2: 100% 99%  100%     MDM   Meds given in ED:  Medications  0.9 %  sodium chloride infusion ( Intravenous Stopped 08/21/15 2312)  insulin aspart (novoLOG) injection 10 Units (10 Units Intravenous Not Given 08/21/15 2109)  dextrose 50 % solution 50 mL (50 mLs Intravenous Given 08/21/15 2108)  sodium bicarbonate injection 50 mEq (50 mEq Intravenous Given 08/21/15 2108)    Discharge Medication List as of 08/21/2015 10:49 PM      Final diagnoses:  Abnormal laboratory test result    This is a 45 y.o. female who presents to the ED After being sent by her home health nurse who checked her blood work and this revealed hyperkalemia. Patient was recently admitted to the emergency department for failure to thrive and was started on TPN. They checked some blood work through her PICC line and it revealed a potassium of greater than 7 and hyperglycemia. Patient was sent to the emergency department. Patient reports she has been having low appetite. She reports feeling fatigue. She denies any muscle weakness or chest pain or shortness of breath. She reports she has been eating and drinking little recently. She is currently on TPN. On exam the patient is afebrile nontoxic appearing. She reports having fatigue. She denies any muscle weakness. She denies any chest pain or shortness of breath. Lungs were clear  to auscultation bilaterally. Initially an i-STAT Chem-8 was obtained and showed a potassium of 8.1 with a glucose of 81. EKG  Shows no signs of hyperkalemia. Repeat blood work was ordered and CMP shows normal potassium with a potassium of 3.5. Creatinine is around her baseline. CBC is unremarkable. Later, it was determined that the blood from the Chem-8 and CMP were drawn from the same site at the same time. Will recheck a potassium to be certain.   Recheck potassium from her alternate arm shows a potassium of 3.8. This must be lab error. Patient did not receive insulin. Will discharge home and have her follow-up with primary care. I advised the patient to follow-up with their primary care provider this week. I advised the patient to return to the emergency department with new or worsening symptoms or new concerns. The patient verbalized understanding and agreement with plan.   This patient was discussed with and evaluated by Dr. Gilford Raid who agrees with assessment and plan.  Waynetta Pean, PA-C 08/22/15 0128  Isla Pence, MD 08/22/15 [phone]

## 2015-08-22 DIAGNOSIS — E860 Dehydration: Secondary | ICD-10-CM | POA: Diagnosis not present

## 2015-08-22 NOTE — Telephone Encounter (Signed)
Attempted telephone call, left message to return call

## 2015-08-23 DIAGNOSIS — Z7901 Long term (current) use of anticoagulants: Secondary | ICD-10-CM | POA: Diagnosis not present

## 2015-08-23 DIAGNOSIS — R110 Nausea: Secondary | ICD-10-CM

## 2015-08-23 DIAGNOSIS — Z7951 Long term (current) use of inhaled steroids: Secondary | ICD-10-CM | POA: Diagnosis not present

## 2015-08-23 DIAGNOSIS — Z452 Encounter for adjustment and management of vascular access device: Secondary | ICD-10-CM | POA: Diagnosis not present

## 2015-08-23 NOTE — Discharge Summary (Signed)
Physician Discharge Summary  Margaret Fletcher [id number] DOB: Feb 02, 1971 DOA: 08/16/2015  PCP: [name], MD  Admit date: 08/16/2015 Discharge date: 08/19/2015  Recommendations for Outpatient Follow-up:   Follow-up Information    Follow up with [facility].   Why:  Home Health RN    Contact information:   [address] [place] [address] [phone]       Follow up with [name], MD. Schedule an appointment as soon as possible for a visit in 2 weeks.   Specialty:  General Surgery   Contact information:   [place] [address] [phone]      Discharge Diagnoses:  1. PO intolerance 2. Epigastric discomfort 3. 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 Mild protein calorie nutrition 5. IIH 6. obesity  Surgical Procedure: upper endoscopy 6/9  Discharge Condition: good Disposition: home with home health services (TPN)  Diet recommendation: bariatric full liquids  Filed Weights   08/16/15 1735  Weight: 109.2 kg (240 lb 11.9 oz)    History of present illness: The patient is a 45 year old female presenting status-post bariatric surgery. She comes in for follow-up after undergoing laparoscopic Roux-en-Y gastric bypass. She had surgery on May 1. Her preoperative weight was 273 pounds. Her postop course was complicated by being readmitted on May 8 through the 12 for difficulty with oral intake. She described it as epigastric discomfort. A repeat upper GI demonstrated no evidence of leak. There was prompt passage of contrast through her pouch into her Roux limb. Her blood count was normal. She had no fever or tachycardia. There is no suspicion of the postoperative leak. A PICC line was placed and she was sent home with intermittent IV fluid administration 3 times a week. I last saw her last Friday on 5/19. Her weight was 253 lbs. She states that she is doing about same. She is staying on liquids as  instructed. Last week the epigastric discomfort was a 5 or 6 now it is a 3-4. She describes it as a upper tummy ache. She did actually have a flare last evening when she was chewing some grilled chicken for flavor and inadvertently swallowed some and it caused significant epigastric pain within 87min of swallowing it. She denies any left upper quadrant all shoulder pain. She denies any fever or chills. She is having flatus and having bowel movements. She is taking her supplements without difficulty. She is alternating between pushing water and pushing protein shakes. She is getting 1 protein shake per day +water. does better with ice. thicker liquids are more challenging. she reports she had labs this past Monday but didn't bring copy of report. Some days are better than others. She denies any heartburn or indigestion. She is taking Protonix and carafate not religiously. doesn't think it helps. Because of ongoing issues with oral intake and discomfort, i recommended an upper endoscopy to visualize her pouch to try to find reason for her ongoing discomfort   Hospital Course:  She was brought in directly from home to the endo unit. She underwent upper endoscopy under conscious sedation. No significant findings were found to account for ongoing epigastric discomfort. She was admitted to the floor afterward. She was maintained on iv fluids. Prealbumin was 14.7. She continued to struggle with liquid intake over the weekend. On Monday 6/12 her vitals had remained stable. Because of her inability to maintain sufficient oral intake, we made arrangements for her to start TPN  at home. The nutritionist and bariatric coordinator also visited and counseled the pt. She was able to drink a large volume of tea on day of discharge. Home health nursing services were re-established. She was intstructed on what to call for and felt safe for discharge.    Discharge Instructions  Discharge Instructions    Ambulate  hourly while awake    Complete by:  As directed      Call MD for:  difficulty breathing, headache or visual disturbances    Complete by:  As directed      Call MD for:  persistant dizziness or light-headedness    Complete by:  As directed      Call MD for:  persistant nausea and vomiting    Complete by:  As directed      Call MD for:  redness, tenderness, or signs of infection (pain, swelling, redness, odor or green/yellow discharge around incision site)    Complete by:  As directed      Call MD for:  severe uncontrolled pain    Complete by:  As directed      Call MD for:  temperature >101 F    Complete by:  As directed      Diet bariatric full liquid    Complete by:  As directed      Discharge instructions    Complete by:  As directed   See bariatric discharge instructions     Incentive spirometry    Complete by:  As directed   Perform hourly while awake     TPN per pharmacy consult    Complete by:  As directed             Medication List    STOP taking these medications        cefdinir 250 MG/5ML suspension  Commonly known as:  OMNICEF     PROAIR RESPICLICK 123XX123 (90 Base) MCG/ACT Aepb  Generic drug:  Albuterol Sulfate      TAKE these medications        ALPRAZolam 0.5 MG tablet  Commonly known as:  XANAX  Take 1 tablet (0.5 mg total) by mouth 2 (two) times daily as needed for anxiety. Can take 1-2 30 minutes before MRI. Do not drive.     buPROPion 300 MG 24 hr tablet  Commonly known as:  WELLBUTRIN XL  Take 1 tablet (300 mg total) by mouth daily.     CALCIUM-VITAMIN D PO  Take 15 mLs by mouth 3 (three) times daily.     multivitamin Liqd  Take 30 mLs by mouth 2 (two) times daily.     ondansetron 4 MG disintegrating tablet  Commonly known as:  ZOFRAN-ODT  Take 1 tablet (4 mg total) by mouth every 6 (six) hours as needed for nausea.     oxyCODONE 5 MG/5ML solution  Commonly known as:  ROXICODONE  Take 2.5 mLs (2.5 mg total) by mouth every 4 (four) hours as  needed for moderate pain or severe pain (before meals as needed for pain).     pantoprazole 40 MG tablet  Commonly known as:  PROTONIX  Take 40 mg by mouth daily.     propranolol 10 MG tablet  Commonly known as:  INDERAL  Take 1 tablet (10 mg total) by mouth 2 (two) times daily.     sucralfate 1 GM/10ML suspension  Commonly known as:  CARAFATE  Take 10 mLs (1 g total) by mouth 4 (four) times daily -  with meals and at bedtime.     SUMAtriptan 100 MG tablet  Commonly known as:  IMITREX  Take 1 tablet (100 mg total) by mouth once as needed for migraine. May repeat in 2 hours if headache persists or recurs.     thiamine 123XX123 mg, folic acid 1 mg, multivitamins adult 10 mL in sodium chloride 0.9 % 1,000 mL  Inject 1,000 mLs into the vein once. 1L IV every 3 days; first dose Sunday     topiramate 100 MG tablet  Commonly known as:  TOPAMAX  Take 2 tablets (200 mg total) by mouth at bedtime.     VITAMIN B 12 PO  Take 1 tablet by mouth daily.           Follow-up Information    Follow up with [facility].   Why:  Home Health RN    Contact information:   [address] [place] [address] [phone]       Follow up with [name], MD. Schedule an appointment as soon as possible for a visit in 2 weeks.   Specialty:  General Surgery   Contact information:   [place] [address] [phone]        The results of significant diagnostics from this hospitalization (including imaging, microbiology, ancillary and laboratory) are listed below for reference.    Significant Diagnostic Studies: Ct Abdomen Pelvis W Contrast  08/06/2015  CLINICAL DATA:  Recent gastric bypass surgery with epigastric discomfort. EXAM: CT ABDOMEN AND PELVIS WITH CONTRAST TECHNIQUE: Multidetector CT imaging of the abdomen and pelvis was performed using the standard protocol following bolus administration of intravenous contrast. CONTRAST:  125mL  ISOVUE-300 IOPAMIDOL (ISOVUE-300) INJECTION 61% COMPARISON:  None. FINDINGS: Lung bases are free of acute infiltrate or sizable effusion. The gallbladder has been surgically removed. The liver demonstrates a small hypodensity which is less well visualized on the delayed images likely representing a small hemangioma. The spleen, adrenal glands and pancreas are within normal limits. The kidneys demonstrate a normal enhancement pattern. No calculi or obstructive changes are seen. There are changes consistent with the known history of gastric bypass. No perigastric fluid collection or focal postoperative abscess is identified. Contrast material is noted throughout the colon from previous exams suggesting some decreased motility. The appendix is not visualized. No pelvic mass lesion is seen. The bladder is incompletely distended. The uterus and ovaries show some regular enhancement which may be related to underlying fibroid change. No acute bony abnormality is seen. IMPRESSION: Changes consistent with previous gastric bypass surgery. No postoperative complications are identified. Small hepatic hemangioma. Likely fibroid change. No other focal abnormality is noted. Electronically Signed   By: Inez Catalina M.D.   On: 08/06/2015 15:42    Microbiology: Recent Results (from the past 240 hour(s))  Urine culture     Status: None   Collection Time: 08/17/15 12:00 PM  Result Value Ref Range Status   Specimen Description URINE, CLEAN CATCH  Final   Special Requests NONE  Final   Culture NO GROWTH Performed at [facility]   Final   Report Status 08/18/2015 FINAL  Final     Labs: Basic Metabolic Panel:  Recent Labs Lab 08/16/15 1730     NA 140     K 3.4*     CL 111     CO2 23     GLUCOSE 77     BUN 15     CREATININE  1.08*     CALCIUM 8.5*      Liver Function Tests:  Recent Labs Lab 08/16/15 1730   AST 18   ALT 16   ALKPHOS 47   BILITOT 0.6   PROT 6.2*   ALBUMIN 3.3*     CBG:  Recent Labs Lab 08/18/15 0855   GLUCAP 94     Active Problems:   Epigastric pain   Protein-calorie malnutrition, mild ([place])   Time coordinating discharge: 30 minutes  Signed:  [name], MD [facility], [place] [phone] 08/23/2015, 2:48 AM

## 2015-08-26 DIAGNOSIS — K9589 Other complications of other bariatric procedure: Secondary | ICD-10-CM | POA: Diagnosis not present

## 2015-08-26 DIAGNOSIS — Z7951 Long term (current) use of inhaled steroids: Secondary | ICD-10-CM | POA: Diagnosis not present

## 2015-08-26 DIAGNOSIS — Z452 Encounter for adjustment and management of vascular access device: Secondary | ICD-10-CM | POA: Diagnosis not present

## 2015-08-26 DIAGNOSIS — Z7901 Long term (current) use of anticoagulants: Secondary | ICD-10-CM | POA: Diagnosis not present

## 2015-08-26 LAB — I-STAT CHEM 8, ED
BUN: 32 mg/dL — ABNORMAL HIGH (ref 6–20)
Calcium, Ion: 1.06 mmol/L — ABNORMAL LOW (ref 1.12–1.23)
Chloride: 109 mmol/L (ref 101–111)
Creatinine, Ser: 1.4 mg/dL — ABNORMAL HIGH (ref 0.44–1.00)
Glucose, Bld: 81 mg/dL (ref 65–99)
HEMATOCRIT: 43 % (ref 36.0–46.0)
HEMOGLOBIN: 14.6 g/dL (ref 12.0–15.0)
Potassium: 8.1 mmol/L (ref 3.5–5.1)
SODIUM: 138 mmol/L (ref 135–145)
TCO2: 25 mmol/L (ref 0–100)

## 2015-08-26 MED FILL — oxyCODONE HCL 5 MG/5ML SOLN: 5 | 10 days supply | Qty: 150 | Fill #0

## 2015-08-29 DIAGNOSIS — Z7901 Long term (current) use of anticoagulants: Secondary | ICD-10-CM | POA: Diagnosis not present

## 2015-08-29 DIAGNOSIS — Z7951 Long term (current) use of inhaled steroids: Secondary | ICD-10-CM | POA: Diagnosis not present

## 2015-08-29 DIAGNOSIS — Z452 Encounter for adjustment and management of vascular access device: Secondary | ICD-10-CM | POA: Diagnosis not present

## 2015-08-29 DIAGNOSIS — E860 Dehydration: Secondary | ICD-10-CM | POA: Diagnosis not present

## 2015-08-30 DIAGNOSIS — E860 Dehydration: Secondary | ICD-10-CM | POA: Diagnosis not present

## 2015-08-31 DIAGNOSIS — E860 Dehydration: Secondary | ICD-10-CM | POA: Diagnosis not present

## 2015-09-01 DIAGNOSIS — E860 Dehydration: Secondary | ICD-10-CM | POA: Diagnosis not present

## 2015-09-02 ENCOUNTER — Ambulatory Visit (INDEPENDENT_AMBULATORY_CARE_PROVIDER_SITE_OTHER): Payer: 59 | Admitting: Neurology

## 2015-09-02 ENCOUNTER — Encounter: Payer: Self-pay | Admitting: Neurology

## 2015-09-02 VITALS — BP 118/77 | HR 76 | Ht 68.0 in | Wt 241.8 lb

## 2015-09-02 DIAGNOSIS — G932 Benign intracranial hypertension: Secondary | ICD-10-CM | POA: Diagnosis not present

## 2015-09-02 DIAGNOSIS — Z7951 Long term (current) use of inhaled steroids: Secondary | ICD-10-CM | POA: Diagnosis not present

## 2015-09-02 DIAGNOSIS — E860 Dehydration: Secondary | ICD-10-CM | POA: Diagnosis not present

## 2015-09-02 DIAGNOSIS — Z452 Encounter for adjustment and management of vascular access device: Secondary | ICD-10-CM | POA: Diagnosis not present

## 2015-09-02 DIAGNOSIS — G43709 Chronic migraine without aura, not intractable, without status migrainosus: Secondary | ICD-10-CM | POA: Diagnosis not present

## 2015-09-02 DIAGNOSIS — Z7901 Long term (current) use of anticoagulants: Secondary | ICD-10-CM | POA: Diagnosis not present

## 2015-09-02 MED ORDER — SUMATRIPTAN 20 MG/ACT NA SOLN: 20.0000 mg | Freq: Once | NASAL | Status: DC

## 2015-09-02 MED ORDER — PROPRANOLOL HCL 10 MG PO TABS: 20.0000 mg | ORAL_TABLET | Freq: Two times a day (BID) | ORAL | Status: DC

## 2015-09-02 NOTE — Progress Notes (Signed)
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 She had worsening headaches recently. She takes imitrex acutely. Not sure if worsening headache due to recent stress, surgey complications, IIH or her migraines. Her IIH has been mostly under control, visual fields have been good, fundoscopic exam has been good. She is on Topamax 200mg  daily. She uses imitrex. She had a bad migraine last week. She has had 2 migraines in the last month, in between she has been ok. The propranolol has helped. We can increase the propranolol. The last migraines have been with ear pain. Encourage her to take the imitrex at the onset of the headache with zofran. Will change imitrex to nasal spray. Imitrex not working as well since the roux-en-Y. Discussed in detail.   Interval history 07/22/2015: She had the roux en y gastric bypass. She is having significant postoperative complications and she is crying in the office today. Her headaches have increased. The headaches are daily. The headaches are more behind the right eye with throbbing and light sensitivity, they sound more migrainous today than pressure/IIH. The topamax doesn't help like it used to and she feels as though it is contributing to her significant memory changes and depression. She has pressure around the ears as well. Her ears will start hurting. More intense. Memory is terrible. Last visit with Dr Katy Fitch was all normal, visual fields normal. She is on Topamax 150mg  twice daily. She is feeling more depressed. She was feeling better on the Topamax but since the surgery the headaches are worse and memory worse and depression.   Interval update 06/03/2015: Patient with obesity and intracranial idiopathic hypertension here for follow up.Repeat MRI consistent with IIH (There is flattening of the pituitary gland within a mildly  enlarged sella turcica and widening of the optic nerves sheaths) and elevated opening pressure on LP(23cm). She is currently On Topamax due to Sulfa allergy and can't take diamox. Discussed weight loss as integral to the management of her condition that can cause intractable headaches and permanent blindness. She is taking Topamax 200mg  only at night. She feels tired. She has some pins and needles in the arms and feet. She has headaches in the morning. She had a sleep study at [facility] within the last year that did not show OSA. She does not smoke. She is having smelling problems. She has throbbing headaches. She has vision changes. She has blurry vision. Her husband had a vascectomy and she had cervical abalation and she says she cannot have children at this time. Advised to be careful and use backup due to teratogenicity of topamax. Discussed weight loss as the most important way to help with this disorder. If vision or headaches significantly worsen go directly to the ED.   Patient has a sulfa allergy cannot use diamox. Will increase Topamax to 200mg  and then may have to further increase as this is a weaker medication than acetazolamide. Will refer to Dr. Midge Aver for evaluation.   HPI: Margaret Fletcher is a 45 y.o. female here as a referral from Dr. Marin Comment for headaches. PMHx morbid obesity, migraines, depression. She has had headaches since [facility], no inciting events or trauma. In the last several years they are unilateral, she gets unilateral ptosis, she has throbbing, light and noise sensitivity, nausea, dizziness, vertigo. She have worsening anosmia and food smells different,  taste is affected. Worsening headaches. She has headache almost every day and maybe 4-5 a month that are severe and migrainous. Worsening over the last year. She takes Excedrin migraine almost daily. She gets unilateral vision changes, blurriness with the headaches. Worst headache can last 12 hours, 10/10, no vomiting.  She has had to leave work early. She has right ear hearing changes. She had a sleep study completed at [facility] last year.   Reviewed notes, labs and imaging from outside physicians, which showed:  MRI of the brain 06/2011: Personally reviewed and agree with the following IMPRESSION: No acute intracranial findings.  No visible white matter disease, specifically no evidence for complicated migraine.  Abnormally increased amount of cerebrospinal fluid in the sella turcica with flattening of the pituitary gland against the floor.This appearance is consistent with a diagnosis of empty sella, although the relationship of this abnormality to the patient's clinical symptomatology is not established.   Social History   Social History  . Marital Status: Divorced    Spouse Name: N/A  . Number of Children: 2  . Years of Education: 16   Occupational History  . [employer]- nurse tech    Social History Main Topics  . Smoking status: Never Smoker   . Smokeless tobacco: Never Used  . Alcohol Use: Yes     Comment: socially  . Drug Use: No  . Sexual Activity: Yes    Birth Control/ Protection: Surgical   Other Topics Concern  . Not on file   Social History Narrative   Lives with partner   Caffeine use: minimal coffee    Family History  Problem Relation Age of Onset  . Cancer Mother   . Stroke Father   . Diabetes Brother   . Seizures Son   . Migraines Neg Hx     Past Medical History  Diagnosis Date  . Alopecia   . Autoimmune disease ([place])   . Bacterial vaginosis   . Frequent UTI   . Cyst of spleen   . Depression   . Hypertrophy of breast   . Depression   . Anxiety   . Migraine headache     denies, ruled out   . Migraine   . IIH (idiopathic intracranial hypertension) 2017    Past Surgical History  Procedure Laterality Date  . Cesarean section    . Cholecystectomy    . Appendectomy    . Cesarean section    . Wisdom tooth extraction    . Tubal ligation    .  Dilitation & currettage/hystroscopy with novasure ablation N/A 07/19/2012    Procedure: HYSTEROSCOPY WITH NOVASURE ABLATION;  Surgeon: Farrel Gobble. Harrington Challenger, MD;  Location: [facility];  Service: Gynecology;  Laterality: N/A;  . Endometrial ablation    . Breath tek h pylori N/A 05/21/2014    Procedure: BREATH TEK H PYLORI;  Surgeon: Greer Pickerel, MD;  Location: [facility];  Service: General;  Laterality: N/A;  . Breast reduction surgery Bilateral 03/26/2015    Procedure: BILATERAL BREAST REDUCTION  ;  Surgeon: Youlanda Roys, MD;  Location: [facility];  Service: Plastics;  Laterality: Bilateral;  . Laparoscopic roux-en-y gastric bypass with hiatal hernia repair N/A 07/08/2015    Procedure: LAPAROSCOPIC ROUX-EN-Y GASTRIC BYPASS WITH HIATAL HERNIA REPAIR WITH UPPER ENDOSCOPY;  Surgeon: Greer Pickerel, MD;  Location: [facility];  Service: General;  Laterality: N/A;  . Esophagogastroduodenoscopy N/A 08/16/2015    Procedure: UPPER ESOPHAGOGASTRODUODENOSCOPY (EGD);  Surgeon: Greer Pickerel, MD;  Location:  [facility];  Service: General;  Laterality: N/A;    Current Outpatient Prescriptions  Medication Sig Dispense Refill  . ALPRAZolam (XANAX) 0.5 MG tablet Take 1 tablet (0.5 mg total) by mouth 2 (two) times daily as needed for anxiety. Can take 1-2 30 minutes before MRI. Do not drive. 30 tablet 1  . buPROPion (WELLBUTRIN XL) 300 MG 24 hr tablet Take 1 tablet (300 mg total) by mouth daily. 90 tablet 3  . CALCIUM-VITAMIN D PO Take 15 mLs by mouth 3 (three) times daily.    . Cyanocobalamin (VITAMIN B 12 PO) Take 1 tablet by mouth daily.    . ondansetron (ZOFRAN-ODT) 4 MG disintegrating tablet Take 1 tablet (4 mg total) by mouth every 6 (six) hours as needed for nausea. 30 tablet 0  . oxyCODONE (ROXICODONE) 5 MG/5ML solution Take 2.5 mLs (2.5 mg total) by mouth every 4 (four) hours as needed for moderate pain or severe pain (before meals as needed for pain). 150 mL 0  . pantoprazole (PROTONIX) 40 MG tablet  Take 40 mg by mouth daily.  1  . Pediatric Multiple Vitamins (MULTIVITAMIN) LIQD Take 30 mLs by mouth 2 (two) times daily.    . propranolol (INDERAL) 10 MG tablet Take 1 tablet (10 mg total) by mouth 2 (two) times daily. 60 tablet 12  . sucralfate (CARAFATE) 1 GM/10ML suspension Take 10 mLs (1 g total) by mouth 4 (four) times daily -  with meals and at bedtime. 420 mL 0  . SUMAtriptan (IMITREX) 100 MG tablet Take 1 tablet (100 mg total) by mouth once as needed for migraine. May repeat in 2 hours if headache persists or recurs. 10 tablet 12  . thiamine 123XX123 mg, folic acid 1 mg, multivitamins adult 10 mL in sodium chloride 0.9 % 1,000 mL Inject 1,000 mLs into the vein once. 1L IV every 3 days; first dose Sunday 12 Package 1  . topiramate (TOPAMAX) 100 MG tablet Take 2 tablets (200 mg total) by mouth at bedtime. (Patient taking differently: Take 200 mg by mouth 2 (two) times daily. ) 60 tablet 11   No current facility-administered medications for this visit.    Allergies as of 09/02/2015 - Review Complete 09/02/2015  Allergen Reaction Noted  . Sulfa antibiotics Anaphylaxis 01/18/2011    Vitals: BP 118/77 mmHg  Pulse 76  Ht 5\' 8"  (1.727 m)  Wt 241 lb 12.8 oz (109.68 kg)  BMI 36.77 kg/m2 Last Weight:  Wt Readings from Last 1 Encounters:  09/02/15 241 lb 12.8 oz (109.68 kg)   Last Height:   Ht Readings from Last 1 Encounters:  09/02/15 5\' 8"  (1.727 m)    Neuro: Detailed Neurologic Exam  Speech:  Speech is normal; fluent and spontaneous with normal comprehension.  Cognition:  The patient is oriented to person, place, and time;   recent and remote memory intact;   language fluent;   normal attention, concentration,   fund of knowledge Cranial Nerves:  The pupils are equal, round, and reactive to light. The fundi are normal and spontaneous venous pulsations are present. Visual fields are full to finger confrontation. Extraocular movements are intact. Trigeminal  sensation is intact and the muscles of mastication are normal. The face is symmetric. The palate elevates in the midline. Hearing intact. Voice is normal. Shoulder shrug is normal. The tongue has normal motion without fasciculations.   Coordination:  Normal finger to nose and heel to shin. Normal rapid alternating movements.   Gait:  Heel-toe and tandem gait  are normal.   Motor Observation:  No asymmetry, no atrophy, and no involuntary movements noted. Tone:  Normal muscle tone.   Posture:  Posture is normal. normal erect   Strength:  Strength is V/V in the upper and lower limbs.    Sensation: intact to LT   Reflex Exam:  DTR's:  Deep tendon reflexes in the upper and lower extremities are normal bilaterally.  Toes:  The toes are downgoing bilaterally.  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 Repeat MRI consistent with IIH. She also has a history of migraines. She is allergic to sulfa drugs and therefore cannot use Diamox. Worsenign headaches since roux-en-y   Not sure if worsening headache due to recent stress, surgey complications, IIH or her migraines. She also has a history of migraines and her headaches sound more migrainous along with the pressure headaches. Her findoscopic exams have been normal and visual fields normal, can decreased topamax due to worsening fatigue at last appointment, memory problems, depression (all of which can be caused by Topamax)- and watch for headache progression, Propranolol twice daily helped with her migraines.   Some Medication Overuse with Excedrin daily. Advised no more than 2-3x a week.  Increase propranol to 20mg  twice a day Keep on the topiramate 200mg  qhs but can decreases as she loses weight Will change imitrex pills to nasal spray and see if this works better. Tale at the onset.  Take with zofran.  Cambia prn as well for headaches and migraines   Lumbar puncture - opening pressure 23 MRi of the brain w/wo contrast - c/w IIH Topiramate - decreased to 200 mg at last appointment daily at bedtime given worsening memory and depression (funduscopic exam and visual fields have been normal) She has an anaphylactic allergy to sulfa, cannot use diamox. Follow up with Dr. Katy Fitch Discussed [place] and risk of permanent vision loss, proceed to ED directly if vision changes  Sarina Ill, MD  [facility] [address] [place], [place] [address]  Phone [phone] Fax [phone]  A total of 30 minutes was spent face-to-face with this patient. Over half this time was spent on counseling patient on the IIH and migraine diagnosis and different diagnostic and therapeutic options available.

## 2015-09-02 NOTE — Patient Instructions (Addendum)
Remember to drink plenty of fluid, eat healthy meals and do not skip any meals. Try to eat protein with a every meal and eat a healthy snack such as fruit or nuts in between meals. Try to keep a regular sleep-wake schedule and try to exercise daily, particularly in the form of walking, 20-30 minutes a day, if you can.   As far as your medications are concerned, I would like to suggest:  Continue Topiramate 200mg  at night At onset of migraine, imitrex nose spray as well as zofran For migraine or headache [place] up to 10x a month  I would like to see you back in 3 months, sooner if we need to. Please call us with any interim questions, concerns, problems, updates or refill requests.   Our phone number is [phone]. We also have an after hours call service for urgent matters and there is a physician on-call for urgent questions. For any emergencies you know to call 911 or go to the nearest emergency room  Diclofenac powder for oral solution What is this medicine? DICLOFENAC (dye KLOE fen ak) is a non-steroidal anti-inflammatory drug (NSAID). It is used to treat migraine pain. This medicine may be used for other purposes; ask your health care provider or pharmacist if you have questions. What should I tell my health care provider before I take this medicine? They need to know if you have any of these conditions: -asthma, especially aspirin sensitive asthma -coronary artery bypass graft (CABG) surgery within the past 2 weeks -drink more than 3 alcohol-containing drinks a day -heart disease or circulation problems like heart failure or leg edema (fluid retention) -high blood pressure -kidney disease -liver disease -phenylketonuria -stomach problems -an unusual or allergic reaction to diclofenac, aspirin, other NSAIDs, other medicines, foods, dyes, or preservatives -pregnant or trying to get pregnant -breast-feeding How should I use this medicine? Mix this medicine with 1 to 2 ounces of  water. Drink the medicine and water together. Follow the directions on the prescription label. Do not take your medicine more often than directed. Long-term, continuous use may increase the risk of heart attack or stroke. A special MedGuide will be given to you by the pharmacist with each prescription and refill. Be sure to read this information carefully each time. Talk to your pediatrician regarding the use of this medicine in children. Special care may be needed. Elderly patients over 41 years old may have a stronger reaction and need a smaller dose. Overdosage: If you think you have taken too much of this medicine contact a poison control center or emergency room at once. NOTE: This medicine is only for you. Do not share this medicine with others. What if I miss a dose? This does not apply. What may interact with this medicine? Do not take this medicine with any of the following medications: -cidofovir -ketorolac -methotrexate This medicine may also interact with the following medications: -alcohol -aspirin and aspirin-like medicines -cyclosporine -diuretics -lithium -medicines for blood pressure -medicines for osteoporosis -medicines that affect platelets -medicines that treat or prevent blood clots like warfarin -NSAIDs, medicines for pain and inflammation, like ibuprofen or naproxen -pemetrexed -steroid medicines like prednisone or cortisone This list may not describe all possible interactions. Give your health care provider a list of all the medicines, herbs, non-prescription drugs, or dietary supplements you use. Also tell them if you smoke, drink alcohol, or use illegal drugs. Some items may interact with your medicine. What should I watch for while using this medicine? Tell your  doctor or health care professional if your pain does not get better. Talk to your doctor before taking another medicine for pain. Do not treat yourself. This medicine does not prevent heart attack or  stroke. In fact, this medicine may increase the chance of a heart attack or stroke. The chance may increase with longer use of this medicine and in people who have heart disease. If you take aspirin to prevent heart attack or stroke, talk with your doctor or health care professional. Do not take medicines such as ibuprofen and naproxen with this medicine. Side effects such as stomach upset, nausea, or ulcers may be more likely to occur. Many medicines available without a prescription should not be taken with this medicine. This medicine can cause ulcers and bleeding in the stomach and intestines at any time during treatment. Do not smoke cigarettes or drink alcohol. These increase irritation to your stomach and can make it more susceptible to damage from this medicine. Ulcers and bleeding can happen without warning symptoms and can cause death. You may get drowsy or dizzy. Do not drive, use machinery, or do anything that needs mental alertness until you know how this medicine affects you. Do not stand or sit up quickly, especially if you are an older patient. This reduces the risk of dizzy or fainting spells. This medicine can cause you to bleed more easily. Try to avoid damage to your teeth and gums when you brush or floss your teeth. If you take migraine medicines for 10 or more days a month, your migraines may get worse. Keep a diary of headache days and medicine use. Contact your healthcare professional if your migraine attacks occur more frequently. What side effects may I notice from receiving this medicine? Side effects that you should report to your doctor or health care professional as soon as possible: -allergic reactions like skin rash, itching or hives, swelling of the face, lips, or tongue -black or bloody stools, blood in the urine or vomit -blurred vision -chest pain -difficulty breathing or wheezing -nausea or vomiting -fever -redness, blistering, peeling or loosening of the skin,  including inside the mouth -slurred speech or weakness on one side of the body -trouble passing urine or change in the amount of urine -unexplained weight gain or swelling -unusually weak or tired -yellowing of eyes or skin Side effects that usually do not require medical attention (Report these to your doctor or health care professional if they continue or are bothersome.): -constipation -diarrhea -dizziness -headache -heartburn This list may not describe all possible side effects. Call your doctor for medical advice about side effects. You may report side effects to FDA at 1-800-FDA-1088. Where should I keep my medicine? Keep out of the reach of children. Store at room temperature between 15 and 30 degrees C (59 and 86 degrees F). Throw away any unused medicine after the expiration date. NOTE: This sheet is a summary. It may not cover all possible information. If you have questions about this medicine, talk to your doctor, pharmacist, or health care provider.    2016, Elsevier/Gold Standard. (2012-10-25 10:55:34)

## 2015-09-03 ENCOUNTER — Encounter: Payer: Self-pay | Admitting: *Deleted

## 2015-09-03 DIAGNOSIS — E860 Dehydration: Secondary | ICD-10-CM | POA: Diagnosis not present

## 2015-09-03 NOTE — Progress Notes (Signed)
Faxed cambia enrollment form to [facility]. Fax: [phone]. Received confirmation

## 2015-09-04 DIAGNOSIS — Z7951 Long term (current) use of inhaled steroids: Secondary | ICD-10-CM | POA: Diagnosis not present

## 2015-09-04 DIAGNOSIS — Z452 Encounter for adjustment and management of vascular access device: Secondary | ICD-10-CM | POA: Diagnosis not present

## 2015-09-04 DIAGNOSIS — Z761 Encounter for health supervision and care of foundling: Secondary | ICD-10-CM | POA: Diagnosis not present

## 2015-09-04 DIAGNOSIS — E860 Dehydration: Secondary | ICD-10-CM | POA: Diagnosis not present

## 2015-09-04 DIAGNOSIS — Z7901 Long term (current) use of anticoagulants: Secondary | ICD-10-CM | POA: Diagnosis not present

## 2015-09-06 DIAGNOSIS — E860 Dehydration: Secondary | ICD-10-CM | POA: Diagnosis not present

## 2015-09-09 DIAGNOSIS — Z7951 Long term (current) use of inhaled steroids: Secondary | ICD-10-CM | POA: Diagnosis not present

## 2015-09-09 DIAGNOSIS — Z7901 Long term (current) use of anticoagulants: Secondary | ICD-10-CM | POA: Diagnosis not present

## 2015-09-09 DIAGNOSIS — E860 Dehydration: Secondary | ICD-10-CM | POA: Diagnosis not present

## 2015-09-09 DIAGNOSIS — Z452 Encounter for adjustment and management of vascular access device: Secondary | ICD-10-CM | POA: Diagnosis not present

## 2015-09-11 ENCOUNTER — Inpatient Hospital Stay (HOSPITAL_COMMUNITY)
Admission: EM | Admit: 2015-09-11 | Discharge: 2015-09-14 | DRG: 314 | Disposition: A | Discharge status: Home Health | Payer: 59 | Attending: Family Medicine | Admitting: Family Medicine

## 2015-09-11 ENCOUNTER — Encounter (HOSPITAL_COMMUNITY): Payer: Self-pay | Admitting: Emergency Medicine

## 2015-09-11 ENCOUNTER — Emergency Department (HOSPITAL_COMMUNITY): Payer: 59

## 2015-09-11 DIAGNOSIS — E441 Mild protein-calorie malnutrition: Secondary | ICD-10-CM | POA: Diagnosis present

## 2015-09-11 DIAGNOSIS — B9689 Other specified bacterial agents as the cause of diseases classified elsewhere: Secondary | ICD-10-CM | POA: Diagnosis present

## 2015-09-11 DIAGNOSIS — Z809 Family history of malignant neoplasm, unspecified: Secondary | ICD-10-CM

## 2015-09-11 DIAGNOSIS — G932 Benign intracranial hypertension: Secondary | ICD-10-CM | POA: Diagnosis present

## 2015-09-11 DIAGNOSIS — R7881 Bacteremia: Secondary | ICD-10-CM | POA: Diagnosis not present

## 2015-09-11 DIAGNOSIS — R7303 Prediabetes: Secondary | ICD-10-CM

## 2015-09-11 DIAGNOSIS — A4159 Other Gram-negative sepsis: Secondary | ICD-10-CM | POA: Diagnosis not present

## 2015-09-11 DIAGNOSIS — T80211A Bloodstream infection due to central venous catheter, initial encounter: Secondary | ICD-10-CM | POA: Diagnosis not present

## 2015-09-11 DIAGNOSIS — Z882 Allergy status to sulfonamides status: Secondary | ICD-10-CM

## 2015-09-11 DIAGNOSIS — N179 Acute kidney failure, unspecified: Secondary | ICD-10-CM | POA: Diagnosis present

## 2015-09-11 DIAGNOSIS — M791 Myalgia: Secondary | ICD-10-CM | POA: Diagnosis present

## 2015-09-11 DIAGNOSIS — E440 Moderate protein-calorie malnutrition: Secondary | ICD-10-CM | POA: Diagnosis present

## 2015-09-11 DIAGNOSIS — E860 Dehydration: Secondary | ICD-10-CM | POA: Diagnosis present

## 2015-09-11 DIAGNOSIS — R509 Fever, unspecified: Secondary | ICD-10-CM

## 2015-09-11 DIAGNOSIS — Y848 Other medical procedures as the cause of abnormal reaction of the patient, or of later complication, without mention of misadventure at the time of the procedure: Secondary | ICD-10-CM | POA: Diagnosis present

## 2015-09-11 DIAGNOSIS — Z79899 Other long term (current) drug therapy: Secondary | ICD-10-CM

## 2015-09-11 DIAGNOSIS — Z9884 Bariatric surgery status: Secondary | ICD-10-CM | POA: Diagnosis not present

## 2015-09-11 DIAGNOSIS — R21 Rash and other nonspecific skin eruption: Secondary | ICD-10-CM | POA: Diagnosis present

## 2015-09-11 DIAGNOSIS — F419 Anxiety disorder, unspecified: Secondary | ICD-10-CM | POA: Diagnosis present

## 2015-09-11 DIAGNOSIS — G43909 Migraine, unspecified, not intractable, without status migrainosus: Secondary | ICD-10-CM | POA: Diagnosis present

## 2015-09-11 DIAGNOSIS — Z95828 Presence of other vascular implants and grafts: Secondary | ICD-10-CM

## 2015-09-11 DIAGNOSIS — Z87440 Personal history of urinary (tract) infections: Secondary | ICD-10-CM

## 2015-09-11 DIAGNOSIS — Z959 Presence of cardiac and vascular implant and graft, unspecified: Secondary | ICD-10-CM | POA: Diagnosis not present

## 2015-09-11 DIAGNOSIS — Z833 Family history of diabetes mellitus: Secondary | ICD-10-CM

## 2015-09-11 DIAGNOSIS — F329 Major depressive disorder, single episode, unspecified: Secondary | ICD-10-CM | POA: Diagnosis present

## 2015-09-11 DIAGNOSIS — Z823 Family history of stroke: Secondary | ICD-10-CM

## 2015-09-11 LAB — CBC WITH DIFFERENTIAL/PLATELET
BASOS ABS: 0.1 10*3/uL (ref 0.0–0.1)
BASOS PCT: 1 %
EOS ABS: 0.4 10*3/uL (ref 0.0–0.7)
Eosinophils Relative: 4 %
HCT: 34.7 % — ABNORMAL LOW (ref 36.0–46.0)
Hemoglobin: 11.7 g/dL — ABNORMAL LOW (ref 12.0–15.0)
Lymphocytes Relative: 13 %
Lymphs Abs: 1.1 10*3/uL (ref 0.7–4.0)
MCH: 31 pg (ref 26.0–34.0)
MCHC: 33.7 g/dL (ref 30.0–36.0)
MCV: 91.8 fL (ref 78.0–100.0)
MONO ABS: 0.6 10*3/uL (ref 0.1–1.0)
MONOS PCT: 7 %
NEUTROS PCT: 75 %
Neutro Abs: 6 10*3/uL (ref 1.7–7.7)
Platelets: 307 10*3/uL (ref 150–400)
RBC: 3.78 MIL/uL — ABNORMAL LOW (ref 3.87–5.11)
RDW: 14.7 % (ref 11.5–15.5)
WBC: 8.1 10*3/uL (ref 4.0–10.5)

## 2015-09-11 LAB — COMPREHENSIVE METABOLIC PANEL
ALK PHOS: 54 U/L (ref 38–126)
ALT: 27 U/L (ref 14–54)
AST: 22 U/L (ref 15–41)
Albumin: 3.2 g/dL — ABNORMAL LOW (ref 3.5–5.0)
Anion gap: 7 (ref 5–15)
BILIRUBIN TOTAL: 0.4 mg/dL (ref 0.3–1.2)
BUN: 24 mg/dL — ABNORMAL HIGH (ref 6–20)
CALCIUM: 8.5 mg/dL — AB (ref 8.9–10.3)
CO2: 19 mmol/L — AB (ref 22–32)
CREATININE: 1.07 mg/dL — AB (ref 0.44–1.00)
Chloride: 111 mmol/L (ref 101–111)
GFR calc non Af Amer: >60 mL/min (ref >60)
GLUCOSE: 116 mg/dL — AB (ref 65–99)
Potassium: 4.1 mmol/L (ref 3.5–5.1)
SODIUM: 137 mmol/L (ref 135–145)
TOTAL PROTEIN: 6.9 g/dL (ref 6.5–8.1)

## 2015-09-11 LAB — URINALYSIS, ROUTINE W REFLEX MICROSCOPIC
Bilirubin Urine: NEGATIVE
GLUCOSE, UA: NEGATIVE mg/dL
HGB URINE DIPSTICK: NEGATIVE
KETONES UR: NEGATIVE mg/dL
Leukocytes, UA: NEGATIVE
Nitrite: NEGATIVE
PH: 6 (ref 5.0–8.0)
PROTEIN: NEGATIVE mg/dL
Specific Gravity, Urine: 1.023 (ref 1.005–1.030)

## 2015-09-11 LAB — INFLUENZA PANEL BY PCR (TYPE A & B)
H1N1FLUPCR: NOT DETECTED
INFLAPCR: NEGATIVE
Influenza B By PCR: NEGATIVE

## 2015-09-11 LAB — I-STAT CG4 LACTIC ACID, ED: Lactic Acid, Venous: 1.27 mmol/L (ref 0.5–1.9)

## 2015-09-11 LAB — TSH: TSH: 0.533 u[IU]/mL (ref 0.350–4.500)

## 2015-09-11 LAB — LIPASE, BLOOD: Lipase: 31 U/L (ref 11–51)

## 2015-09-11 LAB — T4, FREE: FREE T4: 0.84 ng/dL (ref 0.61–1.12)

## 2015-09-11 LAB — CG4 I-STAT (LACTIC ACID): LACTIC ACID, VENOUS: 0.86 mmol/L (ref 0.5–1.9)

## 2015-09-11 LAB — PREGNANCY, URINE: Preg Test, Ur: NEGATIVE

## 2015-09-11 LAB — I-STAT TROPONIN, ED: TROPONIN I, POC: 0 ng/mL (ref 0.00–0.08)

## 2015-09-11 LAB — CK: Total CK: 87 U/L (ref 38–234)

## 2015-09-11 MED ORDER — SUMATRIPTAN 20 MG/ACT NA SOLN: 20.0000 mg | NASAL | Status: DC | PRN

## 2015-09-11 MED ORDER — ENOXAPARIN SODIUM 60 MG/0.6ML ~~LOC~~ SOLN
50.0000 mg | SUBCUTANEOUS | Status: DC
  Administered 2015-09-12 – 2015-09-13 (×2): 50 mg via SUBCUTANEOUS
  Filled 2015-09-11 (×3): qty 0.6

## 2015-09-11 MED ORDER — PIPERACILLIN-TAZOBACTAM 3.375 G IVPB
3.3750 g | Freq: Three times a day (TID) | INTRAVENOUS | Status: DC
  Administered 2015-09-11 (×2): 3.375 g via INTRAVENOUS
  Filled 2015-09-11 (×3): qty 50

## 2015-09-11 MED ORDER — SODIUM CHLORIDE 0.9 % IV SOLN
INTRAVENOUS | Status: DC
  Administered 2015-09-11 – 2015-09-13 (×4): via INTRAVENOUS
  Administered 2015-09-13: 1000 mL via INTRAVENOUS

## 2015-09-11 MED ORDER — ONDANSETRON HCL 4 MG/2ML IJ SOLN
4.0000 mg | Freq: Once | INTRAMUSCULAR | Status: AC
  Administered 2015-09-11: 4 mg via INTRAVENOUS
  Filled 2015-09-11: qty 2

## 2015-09-11 MED ORDER — ACETAMINOPHEN 650 MG RE SUPP
650.0000 mg | Freq: Four times a day (QID) | RECTAL | Status: DC | PRN
  Administered 2015-09-11: 650 mg via RECTAL

## 2015-09-11 MED ORDER — SODIUM CHLORIDE 0.9 % IV BOLUS (SEPSIS)
1000.0000 mL | Freq: Once | INTRAVENOUS | Status: AC
  Administered 2015-09-11: 1000 mL via INTRAVENOUS

## 2015-09-11 MED ORDER — TOPIRAMATE 100 MG PO TABS
200.0000 mg | ORAL_TABLET | Freq: Two times a day (BID) | ORAL | Status: DC
Start: 2015-09-11 — End: 2015-09-14
  Administered 2015-09-11 – 2015-09-14 (×6): 200 mg via ORAL
  Filled 2015-09-11 (×6): qty 2

## 2015-09-11 MED ORDER — ACETAMINOPHEN 160 MG/5ML PO SOLN
650.0000 mg | Freq: Once | ORAL | Status: AC
  Administered 2015-09-11: 650 mg via ORAL
  Filled 2015-09-11: qty 20.3

## 2015-09-11 MED ORDER — ALPRAZOLAM 0.5 MG PO TABS
0.5000 mg | ORAL_TABLET | Freq: Two times a day (BID) | ORAL | Status: DC | PRN
Start: 2015-09-11 — End: 2015-09-14
  Administered 2015-09-13: 0.5 mg via ORAL
  Filled 2015-09-11: qty 1

## 2015-09-11 MED ORDER — OXYCODONE HCL 5 MG/5ML PO SOLN
2.5000 mg | ORAL | Status: DC | PRN
  Administered 2015-09-11 – 2015-09-12 (×2): 2.5 mg via ORAL
  Filled 2015-09-11 (×2): qty 5

## 2015-09-11 MED ORDER — ONDANSETRON HCL 4 MG PO TABS: 4.0000 mg | ORAL_TABLET | Freq: Four times a day (QID) | ORAL | Status: DC | PRN

## 2015-09-11 MED ORDER — ACETAMINOPHEN 325 MG PO TABS
650.0000 mg | ORAL_TABLET | Freq: Four times a day (QID) | ORAL | Status: DC | PRN
  Administered 2015-09-11: 650 mg via ORAL
  Filled 2015-09-11 (×2): qty 2

## 2015-09-11 MED ORDER — PIPERACILLIN-TAZOBACTAM 3.375 G IVPB 30 MIN
3.3750 g | Freq: Once | INTRAVENOUS | Status: AC
  Administered 2015-09-11: 3.375 g via INTRAVENOUS
  Filled 2015-09-11: qty 50

## 2015-09-11 MED ORDER — PANTOPRAZOLE SODIUM 40 MG PO TBEC
40.0000 mg | DELAYED_RELEASE_TABLET | Freq: Every day | ORAL | Status: DC
  Administered 2015-09-11 – 2015-09-14 (×4): 40 mg via ORAL
  Filled 2015-09-11 (×4): qty 1

## 2015-09-11 MED ORDER — BUPROPION HCL ER (XL) 300 MG PO TB24
300.0000 mg | ORAL_TABLET | Freq: Every day | ORAL | Status: DC
  Administered 2015-09-11 – 2015-09-14 (×4): 300 mg via ORAL
  Filled 2015-09-11 (×4): qty 1

## 2015-09-11 MED ORDER — FENTANYL CITRATE (PF) 100 MCG/2ML IJ SOLN
25.0000 ug | Freq: Once | INTRAMUSCULAR | Status: AC
  Administered 2015-09-11: 25 ug via INTRAVENOUS
  Filled 2015-09-11: qty 2

## 2015-09-11 MED ORDER — VANCOMYCIN HCL IN DEXTROSE 1-5 GM/200ML-% IV SOLN
1000.0000 mg | Freq: Once | INTRAVENOUS | Status: AC
  Administered 2015-09-11: 1000 mg via INTRAVENOUS
  Filled 2015-09-11: qty 200

## 2015-09-11 MED ORDER — ONDANSETRON HCL 4 MG/2ML IJ SOLN
4.0000 mg | Freq: Four times a day (QID) | INTRAMUSCULAR | Status: DC | PRN
  Administered 2015-09-11 – 2015-09-12 (×2): 4 mg via INTRAVENOUS
  Filled 2015-09-11 (×2): qty 2

## 2015-09-11 MED ORDER — VANCOMYCIN HCL IN DEXTROSE 750-5 MG/150ML-% IV SOLN
750.0000 mg | Freq: Two times a day (BID) | INTRAVENOUS | Status: DC
Start: 2015-09-11 — End: 2015-09-12
  Administered 2015-09-11 – 2015-09-12 (×2): 750 mg via INTRAVENOUS
  Filled 2015-09-11 (×2): qty 150

## 2015-09-11 NOTE — Progress Notes (Signed)
[facility]  Patient Status:   Active pt with [facility] prior this ED visit/?admission?  [facility] is providing the following services: HHRN and Home TPN pharmacy team for home TPN.  See home formula below.  Pt receives below formula M, W, F and same formula without lipds on T,Th, S, S.  [facility] hospital team will follow [name] while here to support transition back home when ordered.    If patient discharges after hours, please call [phone].   Larry Sierras 09/11/2015, 9:22 AM

## 2015-09-11 NOTE — ED Provider Notes (Signed)
CSN: [id number]     Arrival date & time 09/11/15  0703 History   First MD Initiated Contact with Patient 09/11/15 [phone]     Chief Complaint  Patient presents with  . Fever     (Consider location/radiation/quality/duration/timing/severity/associated sxs/prior Treatment) HPI   Fever developed last night, up to 103 at home. Yesterday developed severe body aches, diffuse.  Body aches severe, nothing helping.   No known tick exposures, not outdoors, no animals running through woods.  No rash. Has headache, unchanged from chronic IH, nausea unchanged from chronic.  PICC line in place.   Past Medical History  Diagnosis Date  . Alopecia   . Autoimmune disease ([place])   . Bacterial vaginosis   . Frequent UTI   . Cyst of spleen   . Depression   . Hypertrophy of breast   . Depression   . Anxiety   . Migraine headache     denies, ruled out   . Migraine   . IIH (idiopathic intracranial hypertension) 2017   Past Surgical History  Procedure Laterality Date  . Cesarean section    . Cholecystectomy    . Appendectomy    . Cesarean section    . Wisdom tooth extraction    . Tubal ligation    . Dilitation & currettage/hystroscopy with novasure ablation N/A 07/19/2012    Procedure: HYSTEROSCOPY WITH NOVASURE ABLATION;  Surgeon: Farrel Gobble. Harrington Challenger, MD;  Location: [facility];  Service: Gynecology;  Laterality: N/A;  . Endometrial ablation    . Breath tek h pylori N/A 05/21/2014    Procedure: BREATH TEK H PYLORI;  Surgeon: Greer Pickerel, MD;  Location: [facility];  Service: General;  Laterality: N/A;  . Breast reduction surgery Bilateral 03/26/2015    Procedure: BILATERAL BREAST REDUCTION  ;  Surgeon: Youlanda Roys, MD;  Location: [facility];  Service: Plastics;  Laterality: Bilateral;  . Laparoscopic roux-en-y gastric bypass with hiatal hernia repair N/A 07/08/2015    Procedure: LAPAROSCOPIC ROUX-EN-Y GASTRIC BYPASS WITH HIATAL HERNIA REPAIR WITH UPPER ENDOSCOPY;  Surgeon: Greer Pickerel,  MD;  Location: [facility];  Service: General;  Laterality: N/A;  . Esophagogastroduodenoscopy N/A 08/16/2015    Procedure: UPPER ESOPHAGOGASTRODUODENOSCOPY (EGD);  Surgeon: Greer Pickerel, MD;  Location: [facility];  Service: General;  Laterality: N/A;   Family History  Problem Relation Age of Onset  . Cancer Mother   . Stroke Father   . Diabetes Brother   . Seizures Son   . Migraines Neg Hx    Social History  Substance Use Topics  . Smoking status: Never Smoker   . Smokeless tobacco: Never Used  . Alcohol Use: Yes     Comment: socially   OB History    No data available     Review of Systems  Constitutional: Positive for fever and chills.  HENT: Negative for congestion, ear pain and sore throat.   Eyes: Negative for visual disturbance.  Respiratory: Negative for cough and shortness of breath.   Cardiovascular: Negative for chest pain.  Gastrointestinal: Negative for nausea, vomiting, abdominal pain, diarrhea and constipation.  Genitourinary: Negative for dysuria and difficulty urinating.  Musculoskeletal: Positive for myalgias. Negative for back pain and neck pain.  Skin: Negative for rash.  Neurological: Negative for syncope and headaches.      Allergies  Sulfa antibiotics  Home Medications   Prior to Admission medications   Medication Sig Start Date End Date Taking? Authorizing Provider  ALPRAZolam Duanne Moron) 0.5 MG tablet Take 1 tablet (  0.5 mg total) by mouth 2 (two) times daily as needed for anxiety. Can take 1-2 30 minutes before MRI. Do not drive. 07/04/15  Yes Robyn Haber, MD  buPROPion (WELLBUTRIN XL) 300 MG 24 hr tablet Take 1 tablet (300 mg total) by mouth daily. 07/04/15  Yes Robyn Haber, MD  CALCIUM-VITAMIN D PO Take 15 mLs by mouth 3 (three) times daily.   Yes Historical Provider, MD  Cyanocobalamin (VITAMIN B 12 PO) Take 1 tablet by mouth daily.   Yes Historical Provider, MD  ondansetron (ZOFRAN-ODT) 4 MG disintegrating tablet Take 1 tablet (4 mg total) by  mouth every 6 (six) hours as needed for nausea. 07/19/15  Yes Greer Pickerel, MD  oxyCODONE (ROXICODONE) 5 MG/5ML solution Take 2.5 mLs (2.5 mg total) by mouth every 4 (four) hours as needed for moderate pain or severe pain (before meals as needed for pain). 08/19/15  Yes Greer Pickerel, MD  pantoprazole (PROTONIX) 40 MG tablet Take 40 mg by mouth daily. 07/19/15  Yes Historical Provider, MD  propranolol (INDERAL) 10 MG tablet Take 2 tablets (20 mg total) by mouth 2 (two) times daily. 09/02/15  Yes Melvenia Beam, MD  SUMAtriptan (IMITREX) 100 MG tablet Take 1 tablet (100 mg total) by mouth once as needed for migraine. May repeat in 2 hours if headache persists or recurs. 04/30/15  Yes Melvenia Beam, MD  SUMAtriptan (IMITREX) 20 MG/ACT nasal spray Place 1 spray (20 mg total) into the nose once. May repeat in 2 hours if headache persists or recurs. Patient taking differently: Place 20 mg into the nose every 2 (two) hours as needed for migraine. May repeat in 2 hours if headache persists or recurs. 09/02/15  Yes Melvenia Beam, MD  thiamine [id number] mg, folic acid 1 mg, multivitamins adult 10 mL in sodium chloride 0.9 % 1,000 mL Inject 1,000 mLs into the vein once. 1L IV every 3 days; first dose Sunday 07/19/15  Yes Greer Pickerel, MD  topiramate (TOPAMAX) 100 MG tablet Take 2 tablets (200 mg total) by mouth at bedtime. Patient taking differently: Take 200 mg by mouth 2 (two) times daily.  07/22/15  Yes Melvenia Beam, MD  TPN ADULT Inject into the vein every 12 (twelve) hours. 12 hours on then 12 hours off per pt. 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   Yes Historical Provider, MD  sucralfate (CARAFATE) 1 GM/10ML suspension Take 10 mLs (1 g total) by mouth 4 (four) times daily -  with meals and at bedtime. Patient not taking: Reported on 09/11/2015 07/19/15   Greer Pickerel, MD   BP 115/76 mmHg  Pulse 84  Temp(Src) 98.9 F (37.2 C) (Oral)  Resp 16  Ht 5\' 8"  (1.727 m)  Wt 245  lb 12.8 oz (111.494 kg)  BMI 37.38 kg/m2  SpO2 100% Physical Exam  Constitutional: She is oriented to person, place, and time. She appears well-developed and well-nourished. No distress.  HENT:  Head: Normocephalic and atraumatic.  Right Ear: Tympanic membrane is not injected.  Left Ear: Tympanic membrane is not injected.  Mouth/Throat: No oropharyngeal exudate.  Eyes: Conjunctivae and EOM are normal. Pupils are equal, round, and reactive to light.  Neck: Normal range of motion.  Cardiovascular: Normal rate, regular rhythm, normal heart sounds and intact distal pulses.  Exam reveals no gallop and no friction rub.   No murmur heard. Pulmonary/Chest: Effort normal and breath sounds normal. No respiratory distress. She has no  wheezes. She has no rales.  Abdominal: Soft. She exhibits no distension. There is no tenderness. There is no guarding.  Musculoskeletal: She exhibits no edema or tenderness.  Neurological: She is alert and oriented to person, place, and time.  Skin: Skin is warm and dry. No rash noted. She is not diaphoretic. No erythema.  Nursing note and vitals reviewed.   ED Course  Procedures (including critical care time) Labs Review Labs Reviewed  BLOOD CULTURE ID PANEL (REFLEXED) - Abnormal; Notable for the following:    Enterobacteriaceae species DETECTED (*)    Enterobacter cloacae complex DETECTED (*)    All other components within normal limits  COMPREHENSIVE METABOLIC PANEL - Abnormal; Notable for the following:    CO2 19 (*)    Glucose, Bld 116 (*)    BUN 24 (*)    Creatinine, Ser 1.07 (*)    Calcium 8.5 (*)    Albumin 3.2 (*)    All other components within normal limits  CBC WITH DIFFERENTIAL/PLATELET - Abnormal; Notable for the following:    RBC 3.78 (*)    Hemoglobin 11.7 (*)    HCT 34.7 (*)    All other components within normal limits  URINALYSIS, ROUTINE W REFLEX MICROSCOPIC (NOT AT [facility]) - Abnormal; Notable for the following:    APPearance CLOUDY (*)     All other components within normal limits  COMPREHENSIVE METABOLIC PANEL - Abnormal; Notable for the following:    Chloride 116 (*)    CO2 21 (*)    Creatinine, Ser 1.21 (*)    Calcium 7.7 (*)    Total Protein 5.8 (*)    Albumin 2.6 (*)    GFR calc non Af Amer 53 (*)    Anion gap 2 (*)    All other components within normal limits  CBC - Abnormal; Notable for the following:    RBC 3.51 (*)    Hemoglobin 10.7 (*)    HCT 32.6 (*)    All other components within normal limits  CULTURE, BLOOD (ROUTINE X 2)  CULTURE, BLOOD (ROUTINE X 2)  URINE CULTURE  CATH TIP CULTURE  LIPASE, BLOOD  PREGNANCY, URINE  CK  TSH  T4, FREE  INFLUENZA PANEL BY PCR (TYPE A & B, H1N1)  MAGNESIUM  PHOSPHORUS  I-STAT CG4 LACTIC ACID, ED  I-STAT TROPOININ, ED  I-STAT CG4 LACTIC ACID, ED  CG4 I-STAT (LACTIC ACID)    Imaging Review Dg Chest 2 View  09/11/2015  CLINICAL DATA:  Fever, headaches EXAM: CHEST  2 VIEW COMPARISON:  None. FINDINGS: Right-sided PICC line with the tip projecting over the SVC. There is no focal parenchymal opacity. There is no pleural effusion or pneumothorax. The heart and mediastinal contours are unremarkable. The osseous structures are unremarkable. IMPRESSION: No active cardiopulmonary disease. Electronically Signed   By: Kathreen Devoid   On: 09/11/2015 08:00   I have personally reviewed and evaluated these images and lab results as part of my medical decision-making.   EKG Interpretation None      MDM   Final diagnoses:  Fever, unspecified fever cause  S/P PICC central line placement   45 year old female with history of idiopathic intracranial hypertension, alopecia, history of Roux-en-Y, recent evaluation for mild failure to thrive secondary to epigastric discomfort after Roux-en-Y, with PICC line placed in May for administration of TPN presents with concern for fever and myalgias.  Unclear source of fever by history and physical, and given the presence of PICC line with  patient  receiving TPN, covered with vancomycin and Zosyn for fever of unclear etiology, possible bacteremia.  Urinalysis WNL. Chest XR shows no sign of pneumonia.  Hx does not suggest RMSF. No rash. No meningeal signs, no change in headache from baseline.  Heart rate improved with fluids.  Will admit for continued monitoring and abx given concern for risk for bacteremia as etiology.  Gareth Morgan, MD 09/12/15 [phone]

## 2015-09-11 NOTE — Consult Note (Addendum)
Reason for Consult:  Abdominal pain Referring Physician: Dr. Rudolpho Sevin GI Surgery - Dr. Greer Pickerel  Margaret Fletcher is an 45 y.o. female.   HPI: Pt well known to Dr. Redmond Pulling.  S/p  LAPAROSCOPIC ROUX-EN-Y GASTRIC BYPASS WITH HIATAL HERNIA REPAIR WITH UPPER ENDOSCOPY, 07/08/15, Dr. Greer Pickerel.  Post op she has had issues with oral intake and readmitted 5/8-12/17.UGI at that time showed no leak with prompt passage of contrast thru the Roux.  She has an upper endoscopy on 08/16/15 and discharged 08/19/15.  The esophagus was normal. Some mild reflux in distal esophagus; pouch appeared normal size, stomach pouch mucosa no ulcers, no hyperemic mucosa, pouch mucosa may be a little thickened/perhaps pale. 2 visible staples at gastrojejunostomy  surrounding mucosa intact. anastomosis widely patent; able to advance to both efferent & afferent roux limb. At this point she was still unable to tolerate liquids and was started on TPN and this was set up for home use  Now she presents to the ED with fever up to 102, myalgia, and still having abdominal pain.  The pain is more like a sore stomach and is associated with PO intake.  She takes mostly liquids.  She has some days where she is OK, other times she has 2-3 days where she cannot take anything orally because of the discomfort.  This has not changed.  Fever is the main reason she came to the ED  Work up in the ED shows fever up to 102, tachycardia, and stable blood pressure. Creatinine was up to 1.24, normal WBC.  UA is normal, Flu screen is negative,  CXR is normal, we are ask to see. Last CT scan: 08/06/15 - no acute changes or post op changes.  Past Medical History  Diagnosis Date  . Alopecia   . Autoimmune disease ([place])   . Bacterial vaginosis   . Frequent UTI   . Cyst of spleen   . Depression   . Hypertrophy of breast   . Depression   . Anxiety   . Migraine headache     denies, ruled out   . Migraine   . IIH (idiopathic intracranial hypertension)  2017    Past Surgical History  Procedure Laterality Date  . Cesarean section    . Cholecystectomy    . Appendectomy    . Cesarean section    . Wisdom tooth extraction    . Tubal ligation    . Dilitation & currettage/hystroscopy with novasure ablation N/A 07/19/2012    Procedure: HYSTEROSCOPY WITH NOVASURE ABLATION;  Surgeon: Farrel Gobble. Harrington Challenger, MD;  Location: [facility];  Service: Gynecology;  Laterality: N/A;  . Endometrial ablation    . Breath tek h pylori N/A 05/21/2014    Procedure: BREATH TEK H PYLORI;  Surgeon: Greer Pickerel, MD;  Location: [facility];  Service: General;  Laterality: N/A;  . Breast reduction surgery Bilateral 03/26/2015    Procedure: BILATERAL BREAST REDUCTION  ;  Surgeon: Youlanda Roys, MD;  Location: [facility];  Service: Plastics;  Laterality: Bilateral;  . Laparoscopic roux-en-y gastric bypass with hiatal hernia repair N/A 07/08/2015    Procedure: LAPAROSCOPIC ROUX-EN-Y GASTRIC BYPASS WITH HIATAL HERNIA REPAIR WITH UPPER ENDOSCOPY;  Surgeon: Greer Pickerel, MD;  Location: [facility];  Service: General;  Laterality: N/A;  . Esophagogastroduodenoscopy N/A 08/16/2015    Procedure: UPPER ESOPHAGOGASTRODUODENOSCOPY (EGD);  Surgeon: Greer Pickerel, MD;  Location: [facility];  Service: General;  Laterality: N/A;    Family History  Problem Relation  Age of Onset  . Cancer Mother   . Stroke Father   . Diabetes Brother   . Seizures Son   . Migraines Neg Hx     Social History:  reports that she has never smoked. She has never used smokeless tobacco. She reports that she drinks alcohol. She reports that she does not use illicit drugs.  Allergies:  Allergies  Allergen Reactions  . Sulfa Antibiotics Anaphylaxis    Medications:  Prior to Admission:  Prescriptions prior to admission  Medication Sig Dispense Refill Last Dose  . ALPRAZolam (XANAX) 0.5 MG tablet Take 1 tablet (0.5 mg total) by mouth 2 (two) times daily as needed for anxiety. Can take 1-2 30 minutes  before MRI. Do not drive. 30 tablet 1 unknown  . buPROPion (WELLBUTRIN XL) 300 MG 24 hr tablet Take 1 tablet (300 mg total) by mouth daily. 90 tablet 3 09/10/2015 at Unknown time  . CALCIUM-VITAMIN D PO Take 15 mLs by mouth 3 (three) times daily.   09/10/2015 at Unknown time  . Cyanocobalamin (VITAMIN B 12 PO) Take 1 tablet by mouth daily.   09/10/2015 at Unknown time  . ondansetron (ZOFRAN-ODT) 4 MG disintegrating tablet Take 1 tablet (4 mg total) by mouth every 6 (six) hours as needed for nausea. 30 tablet 0 09/11/2015 at Unknown time  . oxyCODONE (ROXICODONE) 5 MG/5ML solution Take 2.5 mLs (2.5 mg total) by mouth every 4 (four) hours as needed for moderate pain or severe pain (before meals as needed for pain). 150 mL 0 09/10/2015 at Unknown time  . pantoprazole (PROTONIX) 40 MG tablet Take 40 mg by mouth daily.  1 09/10/2015 at Unknown time  . propranolol (INDERAL) 10 MG tablet Take 2 tablets (20 mg total) by mouth 2 (two) times daily. 120 tablet 12 09/10/2015 at 2000  . SUMAtriptan (IMITREX) 100 MG tablet Take 1 tablet (100 mg total) by mouth once as needed for migraine. May repeat in 2 hours if headache persists or recurs. 10 tablet 12 unknown  . SUMAtriptan (IMITREX) 20 MG/ACT nasal spray Place 1 spray (20 mg total) into the nose once. May repeat in 2 hours if headache persists or recurs. (Patient taking differently: Place 20 mg into the nose every 2 (two) hours as needed for migraine. May repeat in 2 hours if headache persists or recurs.) 10 Inhaler 11 unknown  . thiamine 062 mg, folic acid 1 mg, multivitamins adult 10 mL in sodium chloride 0.9 % 1,000 mL Inject 1,000 mLs into the vein once. 1L IV every 3 days; first dose Sunday 12 Package 1 09/11/2015 at Unknown time  . topiramate (TOPAMAX) 100 MG tablet Take 2 tablets (200 mg total) by mouth at bedtime. (Patient taking differently: Take 200 mg by mouth 2 (two) times daily. ) 60 tablet 11 09/10/2015 at Unknown time  . TPN ADULT Inject into the vein every 12  (twelve) hours. 12 hours on then 12 hours off per pt. 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   09/11/2015 at Unknown time  . sucralfate (CARAFATE) 1 GM/10ML suspension Take 10 mLs (1 g total) by mouth 4 (four) times daily -  with meals and at bedtime. (Patient not taking: Reported on 09/11/2015) 420 mL 0 Not Taking at Unknown time    Results for orders placed or performed during the hospital encounter of 09/11/15 (from the past 48 hour(s))  Comprehensive metabolic panel     Status: Abnormal   Collection  Time: 09/11/15  7:46 AM  Result Value Ref Range   Sodium 137 135 - 145 mmol/L   Potassium 4.1 3.5 - 5.1 mmol/L   Chloride 111 101 - 111 mmol/L   CO2 19 (L) 22 - 32 mmol/L   Glucose, Bld 116 (H) 65 - 99 mg/dL   BUN 24 (H) 6 - 20 mg/dL   Creatinine, Ser 1.07 (H) 0.44 - 1.00 mg/dL   Calcium 8.5 (L) 8.9 - 10.3 mg/dL   Total Protein 6.9 6.5 - 8.1 g/dL   Albumin 3.2 (L) 3.5 - 5.0 g/dL   AST 22 15 - 41 U/L   ALT 27 14 - 54 U/L   Alkaline Phosphatase 54 38 - 126 U/L   Total Bilirubin 0.4 0.3 - 1.2 mg/dL   GFR calc non Af Amer >60 >60 mL/min   GFR calc Af Amer >60 >60 mL/min    Comment: (NOTE) The eGFR has been calculated using the CKD EPI equation. This calculation has not been validated in all clinical situations. eGFR's persistently <60 mL/min signify possible Chronic Kidney Disease.    Anion gap 7 5 - 15  CBC WITH DIFFERENTIAL     Status: Abnormal   Collection Time: 09/11/15  7:46 AM  Result Value Ref Range   WBC 8.1 4.0 - 10.5 K/uL   RBC 3.78 (L) 3.87 - 5.11 MIL/uL   Hemoglobin 11.7 (L) 12.0 - 15.0 g/dL   HCT 34.7 (L) 36.0 - 46.0 %   MCV 91.8 78.0 - 100.0 fL   MCH 31.0 26.0 - 34.0 pg   MCHC 33.7 30.0 - 36.0 g/dL   RDW 14.7 11.5 - 15.5 %   Platelets 307 150 - 400 K/uL   Neutrophils Relative % 75 %   Neutro Abs 6.0 1.7 - 7.7 K/uL   Lymphocytes Relative 13 %   Lymphs Abs 1.1 0.7 - 4.0 K/uL   Monocytes Relative 7 %   Monocytes Absolute 0.6  0.1 - 1.0 K/uL   Eosinophils Relative 4 %   Eosinophils Absolute 0.4 0.0 - 0.7 K/uL   Basophils Relative 1 %   Basophils Absolute 0.1 0.0 - 0.1 K/uL  Lipase, blood     Status: None   Collection Time: 09/11/15  7:46 AM  Result Value Ref Range   Lipase 31 11 - 51 U/L  CK     Status: None   Collection Time: 09/11/15  7:46 AM  Result Value Ref Range   Total CK 87 38 - 234 U/L  TSH     Status: None   Collection Time: 09/11/15  7:46 AM  Result Value Ref Range   TSH 0.533 0.350 - 4.500 uIU/mL  T4, free     Status: None   Collection Time: 09/11/15  7:46 AM  Result Value Ref Range   Free T4 0.84 0.61 - 1.12 ng/dL    Comment: (NOTE) Biotin ingestion may interfere with free T4 tests. If the results are inconsistent with the TSH level, previous test results, or the clinical presentation, then consider biotin interference. If needed, order repeat testing after stopping biotin. Performed at [facility]   Urinalysis, Routine w reflex microscopic (not at [facility])     Status: Abnormal   Collection Time: 09/11/15  7:53 AM  Result Value Ref Range   Color, Urine YELLOW YELLOW   APPearance CLOUDY (A) CLEAR   Specific Gravity, Urine 1.023 1.005 - 1.030   pH 6.0 5.0 - 8.0   Glucose, UA NEGATIVE NEGATIVE mg/dL  Hgb urine dipstick NEGATIVE NEGATIVE   Bilirubin Urine NEGATIVE NEGATIVE   Ketones, ur NEGATIVE NEGATIVE mg/dL   Protein, ur NEGATIVE NEGATIVE mg/dL   Nitrite NEGATIVE NEGATIVE   Leukocytes, UA NEGATIVE NEGATIVE    Comment: MICROSCOPIC NOT DONE ON URINES WITH NEGATIVE PROTEIN, BLOOD, LEUKOCYTES, NITRITE, OR GLUCOSE <1000 mg/dL.  Pregnancy, urine     Status: None   Collection Time: 09/11/15  7:53 AM  Result Value Ref Range   Preg Test, Ur NEGATIVE NEGATIVE    Comment:        THE SENSITIVITY OF THIS METHODOLOGY IS >20 mIU/mL.   I-stat troponin, ED (not at [facility], [facility])     Status: None   Collection Time: 09/11/15  7:55 AM  Result Value Ref Range   Troponin i, poc 0.00 0.00 -  0.08 ng/mL   Comment 3            Comment: Due to the release kinetics of cTnI, a negative result within the first hours of the onset of symptoms does not rule out myocardial infarction with certainty. If myocardial infarction is still suspected, repeat the test at appropriate intervals.   I-Stat CG4 Lactic Acid, ED  (not at  [facility])     Status: None   Collection Time: 09/11/15  7:58 AM  Result Value Ref Range   Lactic Acid, Venous 1.27 0.5 - 1.9 mmol/L  Influenza panel by PCR (type A & B, H1N1)     Status: None   Collection Time: 09/11/15  8:28 AM  Result Value Ref Range   Influenza A By PCR NEGATIVE NEGATIVE   Influenza B By PCR NEGATIVE NEGATIVE   H1N1 flu by pcr NOT DETECTED NOT DETECTED    Comment:        The Xpert Flu assay (FDA approved for nasal aspirates or washes and nasopharyngeal swab specimens), is intended as an aid in the diagnosis of influenza and should not be used as a sole basis for treatment. Performed at [facility] (Lactic acid)     Status: None   Collection Time: 09/11/15 11:29 AM  Result Value Ref Range   Lactic Acid, Venous 0.86 0.5 - 1.9 mmol/L    Dg Chest 2 View  09/11/2015  CLINICAL DATA:  Fever, headaches EXAM: CHEST  2 VIEW COMPARISON:  None. FINDINGS: Right-sided PICC line with the tip projecting over the SVC. There is no focal parenchymal opacity. There is no pleural effusion or pneumothorax. The heart and mediastinal contours are unremarkable. The osseous structures are unremarkable. IMPRESSION: No active cardiopulmonary disease. Electronically Signed   By: Kathreen Devoid   On: 09/11/2015 08:00    Review of Systems  Constitutional: Positive for fever (started yesterday) and chills. Negative for weight loss, malaise/fatigue and diaphoresis.  Respiratory: Negative.   Cardiovascular: Negative.   Gastrointestinal:       She reports stomach ache with PO's.  Some days are good, other days she cannot eat anything.  Most PO in  liquids.  She says she gets overly full and nauseated.  No real change from last admit when TNA was started.  Genitourinary: Negative.   Musculoskeletal: Positive for myalgias (calf and thighs).  Skin: Negative.   Neurological: Positive for headaches. Negative for weakness.  Endo/Heme/Allergies: Negative.   Psychiatric/Behavioral: Positive for depression. The patient is nervous/anxious.    Blood pressure 107/58, pulse 92, temperature 99 F (37.2 C), temperature source Oral, resp. rate 16, height _0  (1.727 m), weight 111.494  kg (245 lb 12.8 oz), SpO2 100 %. Physical Exam  Constitutional: She is oriented to person, place, and time. She appears well-developed and well-nourished. No distress.  HENT:  Head: Normocephalic and atraumatic.  Nose: Nose normal.  Eyes: Right eye exhibits no discharge. Left eye exhibits no discharge. No scleral icterus.  Neck: Normal range of motion. Neck supple. No JVD present. No tracheal deviation present. No thyromegaly present.  GI: Soft. Bowel sounds are normal. She exhibits no distension and no mass. There is no tenderness. There is no rebound and no guarding.  Incisions all look good and are well healed.  Musculoskeletal: She exhibits no edema.  Lymphadenopathy:    She has no cervical adenopathy.  Neurological: She is alert and oriented to person, place, and time. No cranial nerve deficit.  Skin: Skin is warm and dry. No rash noted. She is not diaphoretic. No erythema. No pallor.  Psychiatric: She has a normal mood and affect. Her behavior is normal. Judgment and thought content normal.    Assessment/Plan: Fever with PICC line/TNA - possible PICC line sepsis Ongoing abdominal pain   Mild renal insuffiencey with increased creatinine Possible dehydraion S/p laparoscopic ROUX_EN_Y gastric bypass with hiatal hernia repair.   Idiopathic intracranial hypertension Headaches/Migraines Depression/Anxiety Malnutrition  Plan:  She reports the pain she is  having is just like what she had when she last saw Dr. Redmond Pulling.  I would hold of on further GI studies for now until she can see Dr. Redmond Pulling.  Dr. Rosendo Gros is on today and will review.  I would watch her glucose, she has been on TNA  Since 08/19/15.  She will most likely need her PICC line removed and wait for a time before replacing it for resumption of TNA.  She has not been in our office since 08/29/15.    JENNINGS,WILLARD 09/11/2015, 12:09 PM   Addendum: Pt likely with PICC line infection.  Will need PICC DC'd and Cx'd Would rec broad spectrum abx for now until micro specifies. No further GI studies at this time.

## 2015-09-11 NOTE — H&P (Signed)
History and Physical    GEORGIANA SPILLANE Fletcher:[id number] DOB: 09/17/1970 DOA: 09/11/2015  PCP: Norberto Sorenson, MD  Patient coming from: Home   Chief Complaint: fever./   HPI: Margaret Fletcher is a 45 y.o. female with medical history significant of IIH, migraine, alopecia, Autoinmune diseases ?, gastric bypass surgery on may 2017, malnutrition post surgery on TPN , has Piic line. Patient presents today with fevers with tempeture 102,  myalgia, symptoms started day prior to admission. She denies cough, diarrhea, dysuria, . Last Bowel movement last week. Since surgery she has not been eating and moves her bowel once per week. She denies worsening abdominal pain. She has had abdominal pain since after surgery. Also she has not been able to eat since after sx due to pain and early saciety.  She has chronic headaches, report mild worsening of her headaches on ambulation.   ED Course: had fever at 101, mild elevated cr at 1.07, bicarb 19, hb 11, WBC 8.1, chest x ray negative, UA negative. Blood culture were drawn.   Review of Systems: As per HPI otherwise 10 point review of systems negative.    Past Medical History  Diagnosis Date  . Alopecia   . Autoimmune disease (HCC)   . Bacterial vaginosis   . Frequent UTI   . Cyst of spleen   . Depression   . Hypertrophy of breast   . Depression   . Anxiety   . Migraine headache     denies, ruled out   . Migraine   . IIH (idiopathic intracranial hypertension) 2017    Past Surgical History  Procedure Laterality Date  . Cesarean section    . Cholecystectomy    . Appendectomy    . Cesarean section    . Wisdom tooth extraction    . Tubal ligation    . Dilitation & currettage/hystroscopy with novasure ablation N/A 07/19/2012    Procedure: HYSTEROSCOPY WITH NOVASURE ABLATION;  Surgeon: Freddrick March. Tenny Craw, MD;  Location: [facility];  Service: Gynecology;  Laterality: N/A;  . Endometrial ablation    . Breath tek h pylori N/A 05/21/2014    Procedure: BREATH TEK H  PYLORI;  Surgeon: Gaynelle Adu, MD;  Location: [facility];  Service: General;  Laterality: N/A;  . Breast reduction surgery Bilateral 03/26/2015    Procedure: BILATERAL BREAST REDUCTION  ;  Surgeon: Eloise Levels, MD;  Location: [facility];  Service: Plastics;  Laterality: Bilateral;  . Laparoscopic roux-en-y gastric bypass with hiatal hernia repair N/A 07/08/2015    Procedure: LAPAROSCOPIC ROUX-EN-Y GASTRIC BYPASS WITH HIATAL HERNIA REPAIR WITH UPPER ENDOSCOPY;  Surgeon: Gaynelle Adu, MD;  Location: [facility];  Service: General;  Laterality: N/A;  . Esophagogastroduodenoscopy N/A 08/16/2015    Procedure: UPPER ESOPHAGOGASTRODUODENOSCOPY (EGD);  Surgeon: Gaynelle Adu, MD;  Location: [facility];  Service: General;  Laterality: N/A;     reports that she has never smoked. She has never used smokeless tobacco. She reports that she drinks alcohol. She reports that she does not use illicit drugs.  Allergies  Allergen Reactions  . Sulfa Antibiotics Anaphylaxis    Family History  Problem Relation Age of Onset  . Cancer Mother   . Stroke Father   . Diabetes Brother   . Seizures Son   . Migraines Neg Hx      Prior to Admission medications   Medication Sig Start Date End Date Taking? Authorizing Provider  ALPRAZolam Prudy Feeler) 0.5 MG tablet Take 1 tablet (0.5 mg total) by mouth  2 (two) times daily as needed for anxiety. Can take 1-2 30 minutes before MRI. Do not drive. 07/04/15  Yes Elvina Sidle, MD  buPROPion (WELLBUTRIN XL) 300 MG 24 hr tablet Take 1 tablet (300 mg total) by mouth daily. 07/04/15  Yes Elvina Sidle, MD  CALCIUM-VITAMIN D PO Take 15 mLs by mouth 3 (three) times daily.   Yes Historical Provider, MD  Cyanocobalamin (VITAMIN B 12 PO) Take 1 tablet by mouth daily.   Yes Historical Provider, MD  ondansetron (ZOFRAN-ODT) 4 MG disintegrating tablet Take 1 tablet (4 mg total) by mouth every 6 (six) hours as needed for nausea. 07/19/15  Yes Gaynelle Adu, MD  oxyCODONE  (ROXICODONE) 5 MG/5ML solution Take 2.5 mLs (2.5 mg total) by mouth every 4 (four) hours as needed for moderate pain or severe pain (before meals as needed for pain). 08/19/15  Yes Gaynelle Adu, MD  pantoprazole (PROTONIX) 40 MG tablet Take 40 mg by mouth daily. 07/19/15  Yes Historical Provider, MD  propranolol (INDERAL) 10 MG tablet Take 2 tablets (20 mg total) by mouth 2 (two) times daily. 09/02/15  Yes Anson Fret, MD  SUMAtriptan (IMITREX) 100 MG tablet Take 1 tablet (100 mg total) by mouth once as needed for migraine. May repeat in 2 hours if headache persists or recurs. 04/30/15  Yes Anson Fret, MD  SUMAtriptan (IMITREX) 20 MG/ACT nasal spray Place 1 spray (20 mg total) into the nose once. May repeat in 2 hours if headache persists or recurs. Patient taking differently: Place 20 mg into the nose every 2 (two) hours as needed for migraine. May repeat in 2 hours if headache persists or recurs. 09/02/15  Yes Anson Fret, MD  thiamine 100 mg, folic acid 1 mg, multivitamins adult 10 mL in sodium chloride 0.9 % 1,000 mL Inject 1,000 mLs into the vein once. 1L IV every 3 days; first dose Sunday 07/19/15  Yes Gaynelle Adu, MD  topiramate (TOPAMAX) 100 MG tablet Take 2 tablets (200 mg total) by mouth at bedtime. Patient taking differently: Take 200 mg by mouth 2 (two) times daily.  07/22/15  Yes Anson Fret, MD  TPN ADULT Inject into the vein every 12 (twelve) hours. 12 hours on then 12 hours off per pt. 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   Yes Historical Provider, MD  sucralfate (CARAFATE) 1 GM/10ML suspension Take 10 mLs (1 g total) by mouth 4 (four) times daily -  with meals and at bedtime. Patient not taking: Reported on 09/11/2015 07/19/15   Gaynelle Adu, MD    Physical Exam: Filed Vitals:   09/11/15 0930 09/11/15 0945 09/11/15 0947 09/11/15 1000  BP: 102/66     Pulse: 86 86  86  Temp:   100.2 F (37.9 C)   TempSrc:   Oral   Resp: 22       Height:      Weight:      SpO2: 97% 98%  98%      Constitutional: NAD, calm, comfortable Filed Vitals:   09/11/15 0930 09/11/15 0945 09/11/15 0947 09/11/15 1000  BP: 102/66     Pulse: 86 86  86  Temp:   100.2 F (37.9 C)   TempSrc:   Oral   Resp: 22     Height:      Weight:      SpO2: 97% 98%  98%   Eyes: PERRL, lids and conjunctivae normal ENMT: Mucous membranes  are moist. Posterior pharynx clear of any exudate or lesions.Normal dentition.  Neck: normal, supple, no masses, no thyromegaly Respiratory: clear to auscultation bilaterally, no wheezing, no crackles. Normal respiratory effort. No accessory muscle use.  Cardiovascular: Regular rate and rhythm, no murmurs / rubs / gallops. No extremity edema. 2+ pedal pulses. No carotid bruits.  Abdomen: no tenderness, no masses palpated. No hepatosplenomegaly. Bowel sounds positive.  Musculoskeletal: no clubbing / cyanosis. No joint deformity upper and lower extremities. Good ROM, no contractures. Normal muscle tone.  Skin: no rashes, lesions, ulcers. No induration Neurologic: CN 2-12 grossly intact. Sensation intact, DTR normal. Strength 5/5 in all 4.  Psychiatric: Normal judgment and insight. Alert and oriented x 3. Normal mood.    Labs on Admission: I have personally reviewed following labs and imaging studies  CBC:  Recent Labs Lab 09/11/15 0746  WBC 8.1  NEUTROABS 6.0  HGB 11.7*  HCT 34.7*  MCV 91.8  PLT 387   Basic Metabolic Panel:  Recent Labs Lab 09/11/15 0746  NA 137  K 4.1  CL 111  CO2 19*  GLUCOSE 116*  BUN 24*  CREATININE 1.07*  CALCIUM 8.5*   GFR: Estimated Creatinine Clearance: 85.4 mL/min (by C-G formula based on Cr of 1.07). Liver Function Tests:  Recent Labs Lab 09/11/15 0746  AST 22  ALT 27  ALKPHOS 54  BILITOT 0.4  PROT 6.9  ALBUMIN 3.2*    Recent Labs Lab 09/11/15 0746  LIPASE 31   No results for input(s): AMMONIA in the last 168 hours. Coagulation Profile: No results  for input(s): INR, PROTIME in the last 168 hours. Cardiac Enzymes:  Recent Labs Lab 09/11/15 0746  CKTOTAL 87   BNP (last 3 results) No results for input(s): PROBNP in the last 8760 hours. HbA1C: No results for input(s): HGBA1C in the last 72 hours. CBG: No results for input(s): GLUCAP in the last 168 hours. Lipid Profile: No results for input(s): CHOL, HDL, LDLCALC, TRIG, CHOLHDL, LDLDIRECT in the last 72 hours. Thyroid Function Tests:  Recent Labs  09/11/15 0746  TSH 0.533   Anemia Panel: No results for input(s): VITAMINB12, FOLATE, FERRITIN, TIBC, IRON, RETICCTPCT in the last 72 hours. Urine analysis:    Component Value Date/Time   COLORURINE YELLOW 09/11/2015 0753   APPEARANCEUR CLOUDY* 09/11/2015 0753   LABSPEC 1.023 09/11/2015 0753   PHURINE 6.0 09/11/2015 0753   GLUCOSEU NEGATIVE 09/11/2015 0753   HGBUR NEGATIVE 09/11/2015 0753   BILIRUBINUR NEGATIVE 09/11/2015 0753   BILIRUBINUR small* 08/06/2015 1615   BILIRUBINUR neg 03/26/2014 1207   [place] 09/11/2015 0753   KETONESUR moderate (40)* 08/06/2015 1615   PROTEINUR NEGATIVE 09/11/2015 0753   PROTEINUR =30* 08/06/2015 1615   PROTEINUR neg 03/26/2014 1207   UROBILINOGEN 1.0 08/06/2015 1615   UROBILINOGEN 0.2 10/07/2013 2326   NITRITE NEGATIVE 09/11/2015 0753   NITRITE Positive* 08/06/2015 1615   NITRITE positive 03/26/2014 1207   LEUKOCYTESUR NEGATIVE 09/11/2015 0753   Sepsis Labs: !!!!!!!!!!!!!!!!!!!!!!!!!!!!!!!!!!!!!!!!!!!! '@LABRCNTIP'$ (procalcitonin:4,lacticidven:4) )No results found for this or any previous visit (from the past 240 hour(s)).   Radiological Exams on Admission: Dg Chest 2 View  09/11/2015  CLINICAL DATA:  Fever, headaches EXAM: CHEST  2 VIEW COMPARISON:  None. FINDINGS: Right-sided PICC line with the tip projecting over the SVC. There is no focal parenchymal opacity. There is no pleural effusion or pneumothorax. The heart and mediastinal contours are unremarkable. The osseous  structures are unremarkable. IMPRESSION: No active cardiopulmonary disease. Electronically Signed   By: [name]  Patel   On: 09/11/2015 08:00    EKG: Independently reviewed. Sinus rhythm.   Assessment/Plan Active Problems:   Fever  1-Fever of unknown origin.   Highly in the differential Bacteremia, she is at risk because of Piic line.  Chest x ary and UA negative.  Continue with IV fluids and broad spectrum antibiotics vancomycin and Zosyn. Due to recent surgery, I have consulted surgery, will defer ordering CT abdomen pelvis to surgery/  Check ESR.  Lactic acid normal.   2-Protein caloric malnutrition; due to intolerance to oral intake due to recent surgery  If bacteremia , will need to remove Piic line.  Pharmacy consulted to hep with parenteral nutrition.   3-Magraine, IIH;  Monitor. Continue with current medications.  Alert and oriented.   4-AKI; suspect related to decrease volume.  IV fluids. Repeat labs in am./ strict I and O./    DVT prophylaxis: lovenox.  Code Status: full code.  Family Communication: care discussed with patient.  Disposition Plan: home at time of discharge  Consults called: general surgery  Admission status: observation.    Elmarie Shiley MD [facility] Pager [phone]  If 7PM-7AM, please contact night-coverage [email or web address] Password [email or web address]  09/11/2015, 10:12 AM

## 2015-09-11 NOTE — Progress Notes (Signed)
Initial Nutrition Assessment  DOCUMENTATION CODES:   Obesity unspecified  INTERVENTION:   TPN per Pharmacy  Requested that patient have unflavored Unjury protein provided from home Encouraged PO intake RD to continue to monitor  NUTRITION DIAGNOSIS:   Inadequate oral intake related to altered GI function as evidenced by meal completion < 50%.  GOAL:   Patient will meet greater than or equal to 90% of their needs  MONITOR:   PO intake, Labs, Weight trends, I & O's, Other (Comment) (TPN)  REASON FOR ASSESSMENT:   Consult New TPN/TNA  ASSESSMENT:   45 y.o. female with medical history significant of IIH, migraine, alopecia, Autoinmune diseases ?, gastric bypass surgery on may 2017, malnutrition post surgery on TPN , has Piic line. Patient presents today with fevers with tempeture 102, myalgia, symptoms started day prior to admission. She denies cough, diarrhea, dysuria, . Last Bowel movement last week. Since surgery she has not been eating and moves her bowel once per week. She denies worsening abdominal pain. She has had abdominal pain since after surgery. Also she has not been able to eat since after sx due to pain and early saciety.  5/1 s/p gastric bypass surgery  Patient reports drinking diet V8 splash and fruit teas mixed with unflavored Unjury protein supplements. Pt has not been able to drink as much as she needs but she has been drinking these protein drinks per pt report. Requested that pt have someone bring her Unjury packets here to mix with her V8 drink (one was at bedside). Pt denies any dumping syndrome or vomiting. She does state that the smell or look of food makes her nauseous.  Pt has been tolerating cyclic TPN at home. Per Pharmacy note, TPN will not be resumed today, Possibly tomorrow. Home TPN provides 1729 kcal and 100 g protein and infused over 12 hours.   Plan per Pharmacy 7/5: Spoke with Dr. Tyrell Antonio, will hold TPN tonight while bacteremia work up in  progress. Spoke with patient and she understands this plan. CMET, Mag and Phos ordered for tomorrow AM. RD consult ordered.  Labs reviewed. Medications reviewed.  Diet Order:  Diet full liquid Room service appropriate?: Yes; Fluid consistency:: Thin  Skin:  Reviewed, no issues  Last BM:  PTA  Height:   Ht Readings from Last 1 Encounters:  09/11/15 5\' 8"  (1.727 m)    Weight:   Wt Readings from Last 1 Encounters:  09/11/15 245 lb 12.8 oz (111.494 kg)    Ideal Body Weight:  63.6 kg  BMI:  Body mass index is 37.38 kg/(m^2).  Estimated Nutritional Needs:   Kcal:  1900-2100  Protein:  90-100g  Fluid:  2.1L/day  EDUCATION NEEDS:   Education needs addressed  Clayton Bibles, MS, RD, LDN Pager: [phone] After Hours Pager: [phone]

## 2015-09-11 NOTE — Progress Notes (Signed)
PARENTERAL NUTRITION CONSULT NOTE - INITIAL  Pharmacy Consult for TPN Indication: Malabsorption   Allergies  Allergen Reactions  . Sulfa Antibiotics Anaphylaxis    Patient Measurements: Height: 5\' 8"  (172.7 cm) Weight: 245 lb 12.8 oz (111.494 kg) IBW/kg (Calculated) : 63.9 Adjusted Body Weight: 83 kg  Vital Signs: Temp: 100.9 F (38.3 C) (07/05 1200) Temp Source: Oral (07/05 1200) BP: 107/58 mmHg (07/05 1128) Pulse Rate: 92 (07/05 1128) Intake/Output from previous day:   Intake/Output from this shift:    Labs:  Recent Labs  09/11/15 0746  WBC 8.1  HGB 11.7*  HCT 34.7*  PLT 307     Recent Labs  09/11/15 0746  NA 137  K 4.1  CL 111  CO2 19*  GLUCOSE 116*  BUN 24*  CREATININE 1.07*  CALCIUM 8.5*  PROT 6.9  ALBUMIN 3.2*  AST 22  ALT 27  ALKPHOS 54  BILITOT 0.4   Estimated Creatinine Clearance: 86.9 mL/min (by C-G formula based on Cr of 1.07).   No results for input(s): GLUCAP in the last 72 hours.  Medical History: Past Medical History  Diagnosis Date  . Alopecia   . Autoimmune disease ([place])   . Bacterial vaginosis   . Frequent UTI   . Cyst of spleen   . Depression   . Hypertrophy of breast   . Depression   . Anxiety   . Migraine headache     denies, ruled out   . Migraine   . IIH (idiopathic intracranial hypertension) 2017    Medications:  Scheduled:  . buPROPion  300 mg Oral Daily  . enoxaparin (LOVENOX) injection  50 mg Subcutaneous Q24H  . pantoprazole  40 mg Oral Daily  . piperacillin-tazobactam (ZOSYN)  IV  3.375 g Intravenous Q8H  . topiramate  200 mg Oral BID  . vancomycin  750 mg Intravenous Q12H   Infusions:  . sodium chloride     PRN: acetaminophen **OR** acetaminophen, ALPRAZolam, ondansetron **OR** ondansetron (ZOFRAN) IV, oxyCODONE, SUMAtriptan  IVF: NS 100 ml/hr  Central access: PICC TPN start date: chronic  ASSESSMENT                                                                                                           HPI: 45 yo female with hx of gastric bypass surgery in May 2017, malnutrition on TPN who presents with fevers and myalgia.  Pharmacy is consulted to continue managing TPN upon admission.  Significant events:   Today: 09/11/2015   Glucose - WNL  Electrolytes - all currently WNL  Renal - SCr 1.07, lower than baseline  LFTs - WNL  TGs -  Prealbumin -  NUTRITIONAL GOALS  RD recs: pending Home formula administered over 12 hours daily provides 100g protein, 1249kcal with an additional 480kcal from lipids on MWF (see note by Carolynn Sayers from [facility])  PLAN                                                                                                                          Spoke with Dr. Tyrell Antonio, will hold TPN tonight while bacteremia work up in progress. Spoke with patient and she understands this plan. CMET, Mag and Phos ordered for tomorrow AM. RD consult ordered.  Peggyann Juba, PharmD, BCPS Pager: [phone] 09/11/2015,12:50 PM

## 2015-09-11 NOTE — Progress Notes (Signed)
Pharmacy Antibiotic Note  JAZIRA GRIJALBA is a 45 y.o. female admitted on 09/11/2015 with fever, tachycardia, headache, myalgias. Hx of idiopathic intracranial hypertension. Pharmacy has been consulted for Zosyn and Vancomycin dosing for sepsis. First doses ordered in the ED.  Plan: Vancomycin 750mg  IV every 12 hours.  Goal trough 15-20 mcg/mL.  Zosyn 3.375g IV Q8H infused over 4hrs. Measure Vanc trough at steady state. Follow up renal fxn, culture results, and clinical course.   Height: 5\' 8"  (172.7 cm) Weight: 238 lb (107.956 kg) IBW/kg (Calculated) : 63.9  Temp (24hrs), Avg:102 F (38.9 C), Min:102 F (38.9 C), Max:102 F (38.9 C)  No results for input(s): WBC, CREATININE, LATICACIDVEN, VANCOTROUGH, VANCOPEAK, VANCORANDOM, GENTTROUGH, GENTPEAK, GENTRANDOM, TOBRATROUGH, TOBRAPEAK, TOBRARND, AMIKACINPEAK, AMIKACINTROU, AMIKACIN in the last 168 hours.  Estimated Creatinine Clearance: 73.7 mL/min (by C-G formula based on Cr of 1.24).    Allergies  Allergen Reactions  . Sulfa Antibiotics Anaphylaxis    Antimicrobials this admission: Vanc 7/5 >>  Zosyn 7/5 >>   Dose adjustments this admission:  Microbiology results: 7/5 BCx: 7/5 UCx:   Thank you for allowing pharmacy to be a part of this patient's care.  Romeo Rabon, PharmD, pager [phone]. 09/11/2015,7:32 AM.

## 2015-09-11 NOTE — Progress Notes (Signed)
ED Cm spoke with Carolynn Sayers of [facility] Pt is active TPN patient  Pt to be followed for d/c needs

## 2015-09-11 NOTE — Progress Notes (Signed)
Pt with [facility] ED visits x 4 and Admissions x 4 in the last 6 months  ED CM spoke with pam at [facility] about following pt for TPN services Pt updated on this Pt continues to confirm [facility] is her choice of agency for services Pt not receiving other home health services at this time  ED CM also introduced pt to [facility] services and discussed her pcp Dr Brigitte Pulse is participating provider Reviewed program briefly  [facility] consult entered to see if pt could be provided more details on [facility]

## 2015-09-11 NOTE — ED Notes (Signed)
Pt with hx of idiopathic intracranial hypertension c/o worsening of headache, myalgia onset yesterday, fever, tachycardia. No exposure to woods or ticks. No sore throat, URI symptoms.

## 2015-09-11 NOTE — ED Notes (Signed)
Patient transported to X-ray 

## 2015-09-12 DIAGNOSIS — G43909 Migraine, unspecified, not intractable, without status migrainosus: Secondary | ICD-10-CM | POA: Diagnosis present

## 2015-09-12 DIAGNOSIS — Z79899 Other long term (current) drug therapy: Secondary | ICD-10-CM | POA: Diagnosis not present

## 2015-09-12 DIAGNOSIS — E441 Mild protein-calorie malnutrition: Secondary | ICD-10-CM | POA: Diagnosis present

## 2015-09-12 DIAGNOSIS — R509 Fever, unspecified: Secondary | ICD-10-CM | POA: Diagnosis present

## 2015-09-12 DIAGNOSIS — R7303 Prediabetes: Secondary | ICD-10-CM | POA: Diagnosis present

## 2015-09-12 DIAGNOSIS — R7881 Bacteremia: Secondary | ICD-10-CM

## 2015-09-12 DIAGNOSIS — Z9884 Bariatric surgery status: Secondary | ICD-10-CM | POA: Diagnosis not present

## 2015-09-12 DIAGNOSIS — Y848 Other medical procedures as the cause of abnormal reaction of the patient, or of later complication, without mention of misadventure at the time of the procedure: Secondary | ICD-10-CM | POA: Diagnosis present

## 2015-09-12 DIAGNOSIS — Z87440 Personal history of urinary (tract) infections: Secondary | ICD-10-CM | POA: Diagnosis not present

## 2015-09-12 DIAGNOSIS — M791 Myalgia: Secondary | ICD-10-CM | POA: Diagnosis present

## 2015-09-12 DIAGNOSIS — G932 Benign intracranial hypertension: Secondary | ICD-10-CM | POA: Diagnosis present

## 2015-09-12 DIAGNOSIS — Z823 Family history of stroke: Secondary | ICD-10-CM | POA: Diagnosis not present

## 2015-09-12 DIAGNOSIS — N179 Acute kidney failure, unspecified: Secondary | ICD-10-CM | POA: Diagnosis present

## 2015-09-12 DIAGNOSIS — B9689 Other specified bacterial agents as the cause of diseases classified elsewhere: Secondary | ICD-10-CM | POA: Diagnosis present

## 2015-09-12 DIAGNOSIS — E860 Dehydration: Secondary | ICD-10-CM | POA: Diagnosis present

## 2015-09-12 DIAGNOSIS — Z882 Allergy status to sulfonamides status: Secondary | ICD-10-CM | POA: Diagnosis not present

## 2015-09-12 DIAGNOSIS — Z833 Family history of diabetes mellitus: Secondary | ICD-10-CM | POA: Diagnosis not present

## 2015-09-12 DIAGNOSIS — Z809 Family history of malignant neoplasm, unspecified: Secondary | ICD-10-CM | POA: Diagnosis not present

## 2015-09-12 DIAGNOSIS — F329 Major depressive disorder, single episode, unspecified: Secondary | ICD-10-CM | POA: Diagnosis present

## 2015-09-12 DIAGNOSIS — A4159 Other Gram-negative sepsis: Secondary | ICD-10-CM | POA: Diagnosis present

## 2015-09-12 DIAGNOSIS — R21 Rash and other nonspecific skin eruption: Secondary | ICD-10-CM | POA: Diagnosis present

## 2015-09-12 DIAGNOSIS — F419 Anxiety disorder, unspecified: Secondary | ICD-10-CM | POA: Diagnosis present

## 2015-09-12 DIAGNOSIS — T80211A Bloodstream infection due to central venous catheter, initial encounter: Secondary | ICD-10-CM | POA: Diagnosis present

## 2015-09-12 LAB — BLOOD CULTURE ID PANEL (REFLEXED)
Acinetobacter baumannii: NOT DETECTED
CANDIDA ALBICANS: NOT DETECTED
CANDIDA GLABRATA: NOT DETECTED
CANDIDA PARAPSILOSIS: NOT DETECTED
CANDIDA TROPICALIS: NOT DETECTED
Candida krusei: NOT DETECTED
Carbapenem resistance: NOT DETECTED
ENTEROBACTER CLOACAE COMPLEX: DETECTED — AB
ENTEROBACTERIACEAE SPECIES: DETECTED — AB
ESCHERICHIA COLI: NOT DETECTED
Enterococcus species: NOT DETECTED
HAEMOPHILUS INFLUENZAE: NOT DETECTED
KLEBSIELLA PNEUMONIAE: NOT DETECTED
Klebsiella oxytoca: NOT DETECTED
Listeria monocytogenes: NOT DETECTED
METHICILLIN RESISTANCE: NOT DETECTED
Neisseria meningitidis: NOT DETECTED
PROTEUS SPECIES: NOT DETECTED
PSEUDOMONAS AERUGINOSA: NOT DETECTED
STREPTOCOCCUS AGALACTIAE: NOT DETECTED
STREPTOCOCCUS PNEUMONIAE: NOT DETECTED
Serratia marcescens: NOT DETECTED
Staphylococcus aureus (BCID): NOT DETECTED
Staphylococcus species: NOT DETECTED
Streptococcus pyogenes: NOT DETECTED
Streptococcus species: NOT DETECTED
Vancomycin resistance: NOT DETECTED

## 2015-09-12 LAB — COMPREHENSIVE METABOLIC PANEL
ALBUMIN: 2.6 g/dL — AB (ref 3.5–5.0)
ALT: 33 U/L (ref 14–54)
AST: 26 U/L (ref 15–41)
Alkaline Phosphatase: 48 U/L (ref 38–126)
Anion gap: 2 — ABNORMAL LOW (ref 5–15)
BUN: 13 mg/dL (ref 6–20)
CHLORIDE: 116 mmol/L — AB (ref 101–111)
CO2: 21 mmol/L — AB (ref 22–32)
CREATININE: 1.21 mg/dL — AB (ref 0.44–1.00)
Calcium: 7.7 mg/dL — ABNORMAL LOW (ref 8.9–10.3)
GFR calc Af Amer: >60 mL/min (ref >60)
GFR calc non Af Amer: 53 mL/min — ABNORMAL LOW (ref >60)
GLUCOSE: 94 mg/dL (ref 65–99)
Potassium: 3.8 mmol/L (ref 3.5–5.1)
SODIUM: 139 mmol/L (ref 135–145)
Total Bilirubin: 0.4 mg/dL (ref 0.3–1.2)
Total Protein: 5.8 g/dL — ABNORMAL LOW (ref 6.5–8.1)

## 2015-09-12 LAB — CBC
HCT: 32.6 % — ABNORMAL LOW (ref 36.0–46.0)
Hemoglobin: 10.7 g/dL — ABNORMAL LOW (ref 12.0–15.0)
MCH: 30.5 pg (ref 26.0–34.0)
MCHC: 32.8 g/dL (ref 30.0–36.0)
MCV: 92.9 fL (ref 78.0–100.0)
PLATELETS: 252 10*3/uL (ref 150–400)
RBC: 3.51 MIL/uL — ABNORMAL LOW (ref 3.87–5.11)
RDW: 15 % (ref 11.5–15.5)
WBC: 5.4 10*3/uL (ref 4.0–10.5)

## 2015-09-12 LAB — PHOSPHORUS: Phosphorus: 3.2 mg/dL (ref 2.5–4.6)

## 2015-09-12 LAB — MAGNESIUM: Magnesium: 1.9 mg/dL (ref 1.7–2.4)

## 2015-09-12 MED ORDER — PROCHLORPERAZINE EDISYLATE 5 MG/ML IJ SOLN: 10.0000 mg | Freq: Four times a day (QID) | INTRAMUSCULAR | Status: DC | PRN

## 2015-09-12 MED ORDER — ADULT MULTIVITAMIN LIQUID CH
15.0000 mL | Freq: Every day | ORAL | Status: DC
  Administered 2015-09-12 – 2015-09-14 (×3): 15 mL via ORAL
  Filled 2015-09-12 (×4): qty 15

## 2015-09-12 MED ORDER — DEXTROSE 5 % IV SOLN
2.0000 g | Freq: Three times a day (TID) | INTRAVENOUS | Status: DC
  Administered 2015-09-12 – 2015-09-14 (×7): 2 g via INTRAVENOUS
  Filled 2015-09-12 (×8): qty 2

## 2015-09-12 MED ORDER — THIAMINE HCL 100 MG/ML IJ SOLN
100.0000 mg | Freq: Every day | INTRAMUSCULAR | Status: DC
  Administered 2015-09-12 – 2015-09-13 (×2): 100 mg via INTRAVENOUS
  Filled 2015-09-12 (×3): qty 2

## 2015-09-12 MED ORDER — DIPHENHYDRAMINE HCL 25 MG PO CAPS
25.0000 mg | ORAL_CAPSULE | Freq: Four times a day (QID) | ORAL | Status: DC | PRN
  Administered 2015-09-12 – 2015-09-13 (×2): 25 mg via ORAL
  Filled 2015-09-12 (×2): qty 1

## 2015-09-12 NOTE — Progress Notes (Signed)
Pharmacy Antibiotic Note  Margaret Fletcher is a 45 y.o. female admitted on 09/11/2015 with sepsis.  Pharmacy has been consulted for Cefepime dosing.  Plan: BCID results were discussed with NP K. Schorr and the patient's antibiotics will be changed to Cefepime 2gm iv q8hr. Further narrowing will be determined based on finalized susceptibilities.  Height: 5\' 8"  (172.7 cm) Weight: 245 lb 12.8 oz (111.494 kg) IBW/kg (Calculated) : 63.9  Temp (24hrs), Avg:100.9 F (38.3 C), Min:99 F (37.2 C), Max:102.3 F (39.1 C)   Recent Labs Lab 09/11/15 0746 09/11/15 0758 09/11/15 1129  WBC 8.1  --   --   CREATININE 1.07*  --   --   LATICACIDVEN  --  1.27 0.86    Estimated Creatinine Clearance: 86.9 mL/min (by C-G formula based on Cr of 1.07).    Allergies  Allergen Reactions  . Sulfa Antibiotics Anaphylaxis    Antimicrobials this admission: Vancomycin 7/5 >> Zosyn 7/5 >> 7/6 Cefepime 7/6 >>  Dose adjustments this admission:   Microbiology results: pending  Thank you for allowing pharmacy to be a part of this patient's care.  Nani Skillern Crowford 09/12/2015 4:15 AM

## 2015-09-12 NOTE — Progress Notes (Signed)
PROGRESS NOTE    Margaret Fletcher  [id number] DOB: 09-19-1970 DOA: 09/11/2015 PCP: [name], MD   Brief Narrative:  45 y.o. female with medical history significant of IIH, migraine, alopecia, Autoinmune diseases ?, gastric bypass surgery on may 2017, malnutrition post surgery on TPN , has Piic line. Patient presents today with fevers with tempeture 102, myalgia, symptoms started day prior to admission.   Assessment & Plan:   Principal Problem:   Bacteremia -We'll plan on discontinuing vancomycin. Discussed with pharmacist reports is growing enterococcus. Patient is on cephalosporin which has good coverage for particular bacterial species. Plan will be to continue current antibiotic regimen unless patient develops allergy.  Maculopapular rash -Could be secondary to contact dermatitis. If occurs after administrating antibiotic regimen will plan on discontinuing current cephalosporin and placing on Levaquin  Active Problems:   Prediabetes -Stable    S/P gastric bypass -Pharmacy on board and assisting with TPN orders    Protein-calorie malnutrition, mild ([place]) -PICC line removed.   DVT prophylaxis: Lovenox Code Status: Full Family Communication: Discussed directly with patient Disposition Plan: Pending improvement in condition   Consultants:   General surgery   Procedures: none   Antimicrobials: Cefepime   Subjective: Pt has no new complaints. No acute issues overnight.  Objective: Filed Vitals:   09/11/15 1414 09/11/15 2202 09/11/15 2345 09/12/15 0635  BP: 97/61 117/68  115/76  Pulse: 102 89  84  Temp: 102.3 F (39.1 C) 102.3 F (39.1 C) 99.9 F (37.7 C) 98.9 F (37.2 C)  TempSrc: Oral Oral Oral Oral  Resp: 20 18  16   Height:      Weight:      SpO2: 96% 100%  100%    Intake/Output Summary (Last 24 hours) at 09/12/15 1313 Last data filed at 09/11/15 1729  Gross per 24 hour  Intake    120 ml  Output      0 ml  Net    120 ml   Filed Weights   09/11/15 0707 09/11/15 0812 09/11/15 1128  Weight: 107.956 kg (238 lb) 107.956 kg (238 lb) 111.494 kg (245 lb 12.8 oz)    Examination:  General exam: Appears calm and comfortable  Respiratory system: Clear to auscultation. Respiratory effort normal. Cardiovascular system: no cyanosis No pedal edema. Gastrointestinal system: Abdomen is nondistended, soft. No organomegaly or masses felt. Normal bowel sounds heard. Central nervous system: Alert and oriented. No focal neurological deficits. Extremities: Symmetric 5 x 5 power. Skin: No rashes, lesions or ulcers Psychiatry: Judgement and insight appear normal. Mood & affect appropriate.   Data Reviewed: I have personally reviewed following labs and imaging studies  CBC:  Recent Labs Lab 09/11/15 0746 09/12/15 0332  WBC 8.1 5.4  NEUTROABS 6.0  --   HGB 11.7* 10.7*  HCT 34.7* 32.6*  MCV 91.8 92.9  PLT 307 [id number]   Basic Metabolic Panel:  Recent Labs Lab 09/11/15 0746 09/12/15 0332  NA 137 139  K 4.1 3.8  CL 111 116*  CO2 19* 21*  GLUCOSE 116* 94  BUN 24* 13  CREATININE 1.07* 1.21*  CALCIUM 8.5* 7.7*  MG  --  1.9  PHOS  --  3.2   GFR: Estimated Creatinine Clearance: 76.8 mL/min (by C-G formula based on Cr of 1.21). Liver Function Tests:  Recent Labs Lab 09/11/15 0746 09/12/15 0332  AST 22 26  ALT 27 33  ALKPHOS 54 48  BILITOT 0.4 0.4  PROT 6.9 5.8*  ALBUMIN 3.2* 2.6*    Recent Labs  Lab 09/11/15 0746  LIPASE 31   No results for input(s): AMMONIA in the last 168 hours. Coagulation Profile: No results for input(s): INR, PROTIME in the last 168 hours. Cardiac Enzymes:  Recent Labs Lab 09/11/15 0746  CKTOTAL 87   BNP (last 3 results) No results for input(s): PROBNP in the last 8760 hours. HbA1C: No results for input(s): HGBA1C in the last 72 hours. CBG: No results for input(s): GLUCAP in the last 168 hours. Lipid Profile: No results for input(s): CHOL, HDL, LDLCALC, TRIG, CHOLHDL, LDLDIRECT in the  last 72 hours. Thyroid Function Tests:  Recent Labs  09/11/15 0746  TSH 0.533  FREET4 0.84   Anemia Panel: No results for input(s): VITAMINB12, FOLATE, FERRITIN, TIBC, IRON, RETICCTPCT in the last 72 hours. Sepsis Labs:  Recent Labs Lab 09/11/15 0758 09/11/15 1129  LATICACIDVEN 1.27 0.86    Recent Results (from the past 240 hour(s))  Blood Culture (routine x 2)     Status: None (Preliminary result)   Collection Time: 09/11/15  7:46 AM  Result Value Ref Range Status   Specimen Description BLOOD LEFT FOREARM  Final   Special Requests BOTTLES DRAWN AEROBIC AND ANAEROBIC 5ML  Final   Culture  Setup Time   Final    GRAM NEGATIVE RODS AEROBIC BOTTLE ONLY Organism ID to follow CRITICAL RESULT CALLED TO, READ BACK BY AND VERIFIED WITH: JULIAN GRIMSLEY,PHARMD @0357  09/12/15 MKELLY    Culture   Final    GRAM NEGATIVE RODS CULTURE REINCUBATED FOR BETTER GROWTH Performed at [facility]    Report Status PENDING  Incomplete  Blood Culture ID Panel (Reflexed)     Status: Abnormal   Collection Time: 09/11/15  7:46 AM  Result Value Ref Range Status   Enterococcus species NOT DETECTED NOT DETECTED Final   Vancomycin resistance NOT DETECTED NOT DETECTED Final   Listeria monocytogenes NOT DETECTED NOT DETECTED Final   Staphylococcus species NOT DETECTED NOT DETECTED Final   Staphylococcus aureus NOT DETECTED NOT DETECTED Final   Methicillin resistance NOT DETECTED NOT DETECTED Final   Streptococcus species NOT DETECTED NOT DETECTED Final   Streptococcus agalactiae NOT DETECTED NOT DETECTED Final   Streptococcus pneumoniae NOT DETECTED NOT DETECTED Final   Streptococcus pyogenes NOT DETECTED NOT DETECTED Final   Acinetobacter baumannii NOT DETECTED NOT DETECTED Final   Enterobacteriaceae species DETECTED (A) NOT DETECTED Final    Comment: CRITICAL RESULT CALLED TO, READ BACK BY AND VERIFIED WITH: JULIAN GRIMSLEY,PHARMD @0357  09/12/15 MKELLY    Enterobacter cloacae complex  DETECTED (A) NOT DETECTED Final    Comment: CRITICAL RESULT CALLED TO, READ BACK BY AND VERIFIED WITH: JULIAN GRIMSLEY,PHARMD @0357  09/12/15 MKELLY    Escherichia coli NOT DETECTED NOT DETECTED Final   Klebsiella oxytoca NOT DETECTED NOT DETECTED Final   Klebsiella pneumoniae NOT DETECTED NOT DETECTED Final   Proteus species NOT DETECTED NOT DETECTED Final   Serratia marcescens NOT DETECTED NOT DETECTED Final   Carbapenem resistance NOT DETECTED NOT DETECTED Final   Haemophilus influenzae NOT DETECTED NOT DETECTED Final   Neisseria meningitidis NOT DETECTED NOT DETECTED Final   Pseudomonas aeruginosa NOT DETECTED NOT DETECTED Final   Candida albicans NOT DETECTED NOT DETECTED Final   Candida glabrata NOT DETECTED NOT DETECTED Final   Candida krusei NOT DETECTED NOT DETECTED Final   Candida parapsilosis NOT DETECTED NOT DETECTED Final   Candida tropicalis NOT DETECTED NOT DETECTED Final    Comment: Performed at [place]  Status: None (Preliminary result)   Collection Time: 09/11/15  4:12 PM  Result Value Ref Range Status   Specimen Description CATH TIP  Final   Special Requests NONE  Final   Culture GRAM NEGATIVE RODS  Final   Report Status PENDING  Incomplete         Radiology Studies: Dg Chest 2 View  09/11/2015  CLINICAL DATA:  Fever, headaches EXAM: CHEST  2 VIEW COMPARISON:  None. FINDINGS: Right-sided PICC line with the tip projecting over the SVC. There is no focal parenchymal opacity. There is no pleural effusion or pneumothorax. The heart and mediastinal contours are unremarkable. The osseous structures are unremarkable. IMPRESSION: No active cardiopulmonary disease. Electronically Signed   By: Kathreen Devoid   On: 09/11/2015 08:00        Scheduled Meds: . buPROPion  300 mg Oral Daily  . ceFEPime (MAXIPIME) IV  2 g Intravenous Q8H  . enoxaparin (LOVENOX) injection  50 mg Subcutaneous Q24H  . pantoprazole  40 mg Oral Daily  .  topiramate  200 mg Oral BID   Continuous Infusions: . sodium chloride 125 mL/hr at 09/11/15 1653    Time spent: > 35 minutes  Velvet Bathe, MD [facility] Pager [phone]  If 7PM-7AM, please contact night-coverage [email or web address] Password [email or web address] 09/12/2015, 1:13 PM

## 2015-09-12 NOTE — Progress Notes (Signed)
Nutrition Brief Follow-up  RD followed-up with patient with RN at bedside. Pt has had her unflavored Unjury protein supplement brought from home. States she has not drank any yet. She requests juice and RD suggested she cut the juice with water if she has any and to add her protein powder to the drink.   Pt had home PICC line removed yesterday 7/5. Home TPN to be resumed once PICC replaced.  Please consult and/or page RD with any questions.  Clayton Bibles, MS, RD, LDN Pager: [phone] After Hours Pager: [phone]

## 2015-09-12 NOTE — Plan of Care (Signed)
Problem: Physical Regulation: Goal: Signs and symptoms of infection will decrease Outcome: Progressing Home PICC possible source of infection; removed on 7/5. Blood culture results returned on 7/6, abx adjusted accordingly.

## 2015-09-12 NOTE — Care Management Note (Signed)
Case Management Note  Patient Details  Name: TERESSA BAES MRN: [id number] Date of Birth: 01-14-1971  Subjective/Objective:      45 yo admitted with Fever. Pt with history of Gastric Bypass and on TNA at home.             Action/Plan: Pt was active with [facility] for HHRN and TPN.  [facility] following along while pt in hospital. Will need resumption orders for [facility] and TPN at discharge if pt to dc back home on TPN.  Expected Discharge Date:   (unknown)               Expected Discharge Plan:  [place]  In-House Referral:     Discharge planning Services  CM Consult  Post Acute Care Choice:    Choice offered to:     DME Arranged:    DME Agency:     HH Arranged:  RN [place] Agency:  [place]  Status of Service:  In process, will continue to follow  If discussed at Long Length of Stay Meetings, dates discussed:    Additional CommentsLynnell Catalan, RN 09/12/2015, 11:58 AM [phone]

## 2015-09-12 NOTE — Progress Notes (Signed)
Subjective: No fevers. Still with postprandial epigastric discomfort. Not worse. About the same. Some days better than others. Been sipping her protein shake and apple juice. Had a rash with 1 of the abx  Objective: Vital signs in last 24 hours: Temp:  [98.6 F (37 C)-102.3 F (39.1 C)] 98.6 F (37 C) (07/06 1430) Pulse Rate:  [69-89] 69 (07/06 1430) Resp:  [16-18] 18 (07/06 1430) BP: (108-117)/(68-76) 108/74 mmHg (07/06 1430) SpO2:  [100 %] 100 % (07/06 1430) Last BM Date: 09/10/15  Intake/Output from previous day: 07/05 0701 - 07/06 0700 In: 120 [P.O.:120] Out: -  Intake/Output this shift:    Alert, resting comfortably, nontoxic Soft, nt, nd No edema  Lab Results:   Recent Labs  09/11/15 0746 09/12/15 0332  WBC 8.1 5.4  HGB 11.7* 10.7*  HCT 34.7* 32.6*  PLT 307 252   BMET  Recent Labs  09/11/15 0746 09/12/15 0332  NA 137 139  K 4.1 3.8  CL 111 116*  CO2 19* 21*  GLUCOSE 116* 94  BUN 24* 13  CREATININE 1.07* 1.21*  CALCIUM 8.5* 7.7*   Hepatic Function Latest Ref Rng 09/12/2015 09/11/2015 08/21/2015  Total Protein 6.5 - 8.1 g/dL 5.8(L) 6.9 7.4  Albumin 3.5 - 5.0 g/dL 2.6(L) 3.2(L) 3.8  AST 15 - 41 U/L _0 ALT 14 - 54 U/L 33 27 22  Alk Phosphatase 38 - 126 U/L 48 54 57  Total Bilirubin 0.3 - 1.2 mg/dL 0.4 0.4 0.7      PT/INR No results for input(s): LABPROT, INR in the last 72 hours. ABG No results for input(s): PHART, HCO3 in the last 72 hours.  Invalid input(s): PCO2, PO2  Studies/Results: Dg Chest 2 View  09/11/2015  CLINICAL DATA:  Fever, headaches EXAM: CHEST  2 VIEW COMPARISON:  None. FINDINGS: Right-sided PICC line with the tip projecting over the SVC. There is no focal parenchymal opacity. There is no pleural effusion or pneumothorax. The heart and mediastinal contours are unremarkable. The osseous structures are unremarkable. IMPRESSION: No active cardiopulmonary disease. Electronically Signed   By: Kathreen Devoid   On: 09/11/2015  08:00    Anti-infectives: Anti-infectives    Start     Dose/Rate Route Frequency Ordered Stop   09/12/15 0500  ceFEPIme (MAXIPIME) 2 g in dextrose 5 % 50 mL IVPB     2 g 100 mL/hr over 30 Minutes Intravenous Every 8 hours 09/12/15 0413     09/11/15 2000  vancomycin (VANCOCIN) IVPB 750 mg/150 ml premix  Status:  Discontinued     750 mg 150 mL/hr over 60 Minutes Intravenous Every 12 hours 09/11/15 0734 09/12/15 0947   09/11/15 1600  piperacillin-tazobactam (ZOSYN) IVPB 3.375 g  Status:  Discontinued     3.375 g 12.5 mL/hr over 240 Minutes Intravenous Every 8 hours 09/11/15 0734 09/12/15 0413   09/11/15 0730  piperacillin-tazobactam (ZOSYN) IVPB 3.375 g     3.375 g 100 mL/hr over 30 Minutes Intravenous  Once 09/11/15 0722 09/11/15 0834   09/11/15 0730  vancomycin (VANCOCIN) IVPB 1000 mg/200 mL premix     1,000 mg 200 mL/hr over 60 Minutes Intravenous  Once 09/11/15 0722 09/11/15 0950      Assessment/Plan:  -Bacteremia - secondary to PICC line, enterobacter. Susceptibilities pending. Cont IV abx for now. No fever today. Normal wbc. No chills. Defer to medicine about duration of abx and whether or not eventual oral regimen will be appropriate. Cont central line holiday  -mild-moderate PCMN -  cont diet as tolerated. No TPN for now. Needs line holiday given bacteremia.   -h/o LRYGB, with ongoing postprandial discomfort - work up has been unremarkable. Normal CT, essentially normal EGD & UGI. Can't really find a cause for her ongoing discomfort.  At least this point, my thoughts are to give her a line holiday. She can be discharged to home when cleared medically by [facility] for her bacteremia. She how she does on her own with oral nutrition. 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 Discussed with pt.   Will let her premedicate with SMALL dose of oxycodone as  needed for PO intake. Cont antiemetic. Will add IV daily MVI while in hospital Cont chemical vte prophylaxis  Appreciated excellent medical care by [facility]  Leighton Ruff. Redmond Pulling, MD, [place], Bariatric, & Minimally Invasive Surgery [facility] Surgery, [place]      [facility] Jerilynn Mages 09/12/2015

## 2015-09-12 NOTE — Consult Note (Signed)
   [facility] CM Inpatient Consult   09/12/2015  [name] 10/05/1970 [id number]     [facility] Care Management referral received. Went to visit [name] on behalf of [employer] to [employer] for [employer] with [employer]. Spoke with [name] about the [employer] to [employer] for pre-diabetes. She denies the need for [employer] [facility] at this time. Discussed that she will receive post hospital follow up call post discharge. Confirmed best contact number as [phone]. Left [employer] [facility] packet, brochure, and contact information at bedside. Appreciative of visit.  Marthenia Rolling, MSN-Ed, RN,BSN [facility] Liaison [phone]

## 2015-09-12 NOTE — Progress Notes (Signed)
PHARMACY - PHYSICIAN COMMUNICATION CRITICAL VALUE ALERT - BLOOD CULTURE IDENTIFICATION (BCID)  Results for orders placed or performed during the hospital encounter of 09/11/15  Blood Culture ID Panel (Reflexed) (Collected: 09/11/2015  7:46 AM)  Result Value Ref Range   Enterococcus species NOT DETECTED NOT DETECTED   Vancomycin resistance NOT DETECTED NOT DETECTED   Listeria monocytogenes NOT DETECTED NOT DETECTED   Staphylococcus species NOT DETECTED NOT DETECTED   Staphylococcus aureus NOT DETECTED NOT DETECTED   Methicillin resistance NOT DETECTED NOT DETECTED   Streptococcus species NOT DETECTED NOT DETECTED   Streptococcus agalactiae NOT DETECTED NOT DETECTED   Streptococcus pneumoniae NOT DETECTED NOT DETECTED   Streptococcus pyogenes NOT DETECTED NOT DETECTED   Acinetobacter baumannii NOT DETECTED NOT DETECTED   Enterobacteriaceae species DETECTED (A) NOT DETECTED   Enterobacter cloacae complex DETECTED (A) NOT DETECTED   Escherichia coli NOT DETECTED NOT DETECTED   Klebsiella oxytoca NOT DETECTED NOT DETECTED   Klebsiella pneumoniae NOT DETECTED NOT DETECTED   Proteus species NOT DETECTED NOT DETECTED   Serratia marcescens NOT DETECTED NOT DETECTED   Carbapenem resistance NOT DETECTED NOT DETECTED   Haemophilus influenzae NOT DETECTED NOT DETECTED   Neisseria meningitidis NOT DETECTED NOT DETECTED   Pseudomonas aeruginosa NOT DETECTED NOT DETECTED   Candida albicans NOT DETECTED NOT DETECTED   Candida glabrata NOT DETECTED NOT DETECTED   Candida krusei NOT DETECTED NOT DETECTED   Candida parapsilosis NOT DETECTED NOT DETECTED   Candida tropicalis NOT DETECTED NOT DETECTED    Name of physician (or Provider) Contacted: K. Schorr  Changes to prescribed antibiotics required: Change to Cefepime 2gm iv q8hr--pharmacy to monitor.  Nani Skillern Crowford 09/12/2015  4:14 AM

## 2015-09-13 DIAGNOSIS — Z931 Gastrostomy status: Secondary | ICD-10-CM | POA: Insufficient documentation

## 2015-09-13 LAB — URINE CULTURE: Culture: >=100000 — AB

## 2015-09-13 NOTE — Progress Notes (Signed)
Subjective: No new issues. No fever x 24hrs. On a good day will take in a water bottle size of protein shake  Objective: Vital signs in last 24 hours: Temp:  [98.6 F (37 C)-99.3 F (37.4 C)] 98.7 F (37.1 C) (07/07 0526) Pulse Rate:  [63-82] 63 (07/07 0526) Resp:  [16-18] 16 (07/07 0526) BP: (108-127)/(74-82) 118/81 mmHg (07/07 0526) SpO2:  [100 %] 100 % (07/07 0526) Last BM Date: 09/10/15  Intake/Output from previous day: 07/06 0701 - 07/07 0700 In: 2115 [P.O.:615; I.V.:1500] Out: -  Intake/Output this shift:    Alert, resting comfortably Soft, nt, nd  Lab Results:   Recent Labs  09/11/15 0746 09/12/15 0332  WBC 8.1 5.4  HGB 11.7* 10.7*  HCT 34.7* 32.6*  PLT 307 252   BMET  Recent Labs  09/11/15 0746 09/12/15 0332  NA 137 139  K 4.1 3.8  CL 111 116*  CO2 19* 21*  GLUCOSE 116* 94  BUN 24* 13  CREATININE 1.07* 1.21*  CALCIUM 8.5* 7.7*   PT/INR No results for input(s): LABPROT, INR in the last 72 hours. ABG No results for input(s): PHART, HCO3 in the last 72 hours.  Invalid input(s): PCO2, PO2  Studies/Results: No results found.  Anti-infectives: Anti-infectives    Start     Dose/Rate Route Frequency Ordered Stop   09/12/15 0500  ceFEPIme (MAXIPIME) 2 g in dextrose 5 % 50 mL IVPB     2 g 100 mL/hr over 30 Minutes Intravenous Every 8 hours 09/12/15 0413     09/11/15 2000  vancomycin (VANCOCIN) IVPB 750 mg/150 ml premix  Status:  Discontinued     750 mg 150 mL/hr over 60 Minutes Intravenous Every 12 hours 09/11/15 0734 09/12/15 0947   09/11/15 1600  piperacillin-tazobactam (ZOSYN) IVPB 3.375 g  Status:  Discontinued     3.375 g 12.5 mL/hr over 240 Minutes Intravenous Every 8 hours 09/11/15 0734 09/12/15 0413   09/11/15 0730  piperacillin-tazobactam (ZOSYN) IVPB 3.375 g     3.375 g 100 mL/hr over 30 Minutes Intravenous  Once 09/11/15 0722 09/11/15 0834   09/11/15 0730  vancomycin (VANCOCIN) IVPB 1000 mg/200 mL premix     1,000 mg 200 mL/hr  over 60 Minutes Intravenous  Once 09/11/15 0722 09/11/15 0950      Assessment/Plan: Bacteremia - secondary to PICC line, enterobacter. Susceptibilities pending. Cont IV abx for now. No fever today. Normal wbc. No chills. Defer to medicine about duration of abx and whether or not eventual oral regimen will be appropriate. Cont central line holiday  -+urine culture - >100k enterococcus. Yet UA is clean. Not sure if this residual +culture from prior UTI. No urinary symptoms. Defer to [facility] whether or not needs treatment.   -mild-moderate PCMN - cont diet as tolerated. No TPN for now. Needs line holiday given bacteremia.   -h/o LRYGB, with ongoing postprandial discomfort - work up has been unremarkable. Normal CT, essentially normal EGD & UGI. Can't really find a cause for her ongoing discomfort. At this point, my thoughts are to give her a line holiday. She can be discharged to home when cleared medically by [facility] for her bacteremia. See how she does on her own with oral nutrition. 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 Discussed with pt.   Will let her premedicate with SMALL dose of oxycodone as needed for PO intake. Cont antiemetic. Will  add IV daily MVI while in hospital Cont chemical vte prophylaxis  After discharge if pt needs intermittent IV fluids for hydration, she can call our office and we now have capability to send her to [facility] in [place] building to their infusion room for intermittent IVF  Appreciated excellent medical care by [facility]  Leighton Ruff. Redmond Pulling, MD, FACS General, Bariatric, & Minimally Invasive Surgery [facility] Surgery, [place]   LOS: 1 day    [name] 09/13/2015

## 2015-09-13 NOTE — Progress Notes (Signed)
Nutrition Follow-up  DOCUMENTATION CODES:   Obesity unspecified  INTERVENTION:  -Encourage PO intake -Encourage intake of Unjury protein from home -RD to continue to monitor -If unable to improve PO intake and re-initiate TPN, recommend tubefeeds NUTRITION DIAGNOSIS:   Inadequate oral intake related to altered GI function as evidenced by meal completion < 50%.  GOAL:   Patient will meet greater than or equal to 90% of their needs  MONITOR:   PO intake, Labs, Weight trends, I & O's, Other (Comment) (TPN)  REASON FOR ASSESSMENT:   Consult New TPN/TNA  ASSESSMENT:   45 y.o. female with medical history significant of IIH, migraine, alopecia, Autoinmune diseases ?, gastric bypass surgery on may 2017, malnutrition post surgery on TPN , has Piic line. Patient presents today with fevers with tempeture 102, myalgia, symptoms started day prior to admission. She denies cough, diarrhea, dysuria, . Last Bowel movement last week. Since surgery she has not been eating and moves her bowel once per week. She denies worsening abdominal pain. She has had abdominal pain since after surgery. Also she has not been able to eat since after sx due to pain and early saciety.   Ms [name] continues to have poor PO intake. She describes issues with texture and  Temperature. If a food is "thick" (in terms of consistency) it feels like "ice cubes" going down. If a food is soft and hot, "it burns in my mouth and down my throat." She also complains of food smelling like a "rotten onion in the sun." Ability to taste food is compromised. She is currently on a line holiday as a result of bacteremia. I suspect PO intake will remain poor, only alternative to TPN at this point is tubefeeds which she did not seem interested in during my visit. She consumed some potato soup for lunch but otherwise has eaten nothing today. States that she may go 2-3 days without eating, and then some days she will eat.  Labs and  medications reviewed: Multivitamin Ca 7.7  Diet Order:  Diet full liquid Room service appropriate?: Yes; Fluid consistency:: Thin  Skin:  Reviewed, no issues  Last BM:  PTA  Height:   Ht Readings from Last 1 Encounters:  09/11/15 5\' 8"  (1.727 m)    Weight:   Wt Readings from Last 1 Encounters:  09/11/15 245 lb 12.8 oz (111.494 kg)    Ideal Body Weight:  63.6 kg  BMI:  Body mass index is 37.38 kg/(m^2).  Estimated Nutritional Needs:   Kcal:  1900-2100  Protein:  90-100g  Fluid:  2.1L/day  EDUCATION NEEDS:   Education needs addressed  Satira Anis. Jessalyn Hinojosa, MS, RD LDN Inpatient Clinical Dietitian Pager [phone]

## 2015-09-13 NOTE — Progress Notes (Signed)
PROGRESS NOTE    Margaret Fletcher  [id number] DOB: Jan 10, 1971 DOA: 09/11/2015 PCP: [name], MD   Brief Narrative:  45 y.o. female with medical history significant of IIH, migraine, alopecia, Autoinmune diseases ?, gastric bypass surgery on may 2017, malnutrition post surgery on TPN , has Piic line. Patient presents today with fevers with tempeture 102, myalgia, symptoms started day prior to admission.   Assessment & Plan:   Principal Problem:   Bacteremia -continue current antibiotic regimen.  - currently on cefepime. Patient inquiring about disposition. Will consult ID to assist with recommendations regarding length of therapy and antibiotic route.   Maculopapular rash - resolving. Thought to have been secondary to zosyn  Active Problems:   Prediabetes -Stable    S/P gastric bypass -Pharmacy on board and assisting with TPN orders    Protein-calorie malnutrition, mild ([place]) -PICC line removed.   DVT prophylaxis: Lovenox Code Status: Full Family Communication: Discussed directly with patient Disposition Plan: Pending improvement in condition   Consultants:   General surgery   Procedures: none   Antimicrobials: Cefepime   Subjective: Pt has no new complaints. No acute issues overnight.  Objective: Filed Vitals:   09/12/15 1430 09/12/15 2107 09/13/15 0526 09/13/15 1446  BP: 108/74 127/82 118/81 129/86  Pulse: 69 82 63 77  Temp: 98.6 F (37 C) 99.3 F (37.4 C) 98.7 F (37.1 C) 98 F (36.7 C)  TempSrc: Oral Oral Oral Oral  Resp: 18 16 16 18   Height:      Weight:      SpO2: 100% 100% 100% 100%    Intake/Output Summary (Last 24 hours) at 09/13/15 1448 Last data filed at 09/13/15 0700  Gross per 24 hour  Intake   2115 ml  Output      0 ml  Net   2115 ml   Filed Weights   09/11/15 0707 09/11/15 0812 09/11/15 1128  Weight: 107.956 kg (238 lb) 107.956 kg (238 lb) 111.494 kg (245 lb 12.8 oz)    Examination:  General exam: Appears calm and  comfortable  Respiratory system: Clear to auscultation. Respiratory effort normal. Cardiovascular system: no cyanosis No pedal edema. Gastrointestinal system: Abdomen is nondistended, soft. No organomegaly or masses felt. Normal bowel sounds heard. Central nervous system: Alert and oriented. No focal neurological deficits. Extremities: Symmetric 5 x 5 power. Skin: No rashes, lesions or ulcers Psychiatry: Judgement and insight appear normal. Mood & affect appropriate.   Data Reviewed: I have personally reviewed following labs and imaging studies  CBC:  Recent Labs Lab 09/11/15 0746 09/12/15 0332  WBC 8.1 5.4  NEUTROABS 6.0  --   HGB 11.7* 10.7*  HCT 34.7* 32.6*  MCV 91.8 92.9  PLT 307 [id number]   Basic Metabolic Panel:  Recent Labs Lab 09/11/15 0746 09/12/15 0332  NA 137 139  K 4.1 3.8  CL 111 116*  CO2 19* 21*  GLUCOSE 116* 94  BUN 24* 13  CREATININE 1.07* 1.21*  CALCIUM 8.5* 7.7*  MG  --  1.9  PHOS  --  3.2   GFR: Estimated Creatinine Clearance: 76.8 mL/min (by C-G formula based on Cr of 1.21). Liver Function Tests:  Recent Labs Lab 09/11/15 0746 09/12/15 0332  AST 22 26  ALT 27 33  ALKPHOS 54 48  BILITOT 0.4 0.4  PROT 6.9 5.8*  ALBUMIN 3.2* 2.6*    Recent Labs Lab 09/11/15 0746  LIPASE 31   No results for input(s): AMMONIA in the last 168 hours. Coagulation Profile: No results  for input(s): INR, PROTIME in the last 168 hours. Cardiac Enzymes:  Recent Labs Lab 09/11/15 0746  CKTOTAL 87   BNP (last 3 results) No results for input(s): PROBNP in the last 8760 hours. HbA1C: No results for input(s): HGBA1C in the last 72 hours. CBG: No results for input(s): GLUCAP in the last 168 hours. Lipid Profile: No results for input(s): CHOL, HDL, LDLCALC, TRIG, CHOLHDL, LDLDIRECT in the last 72 hours. Thyroid Function Tests:  Recent Labs  09/11/15 0746  TSH 0.533  FREET4 0.84   Anemia Panel: No results for input(s): VITAMINB12, FOLATE, FERRITIN,  TIBC, IRON, RETICCTPCT in the last 72 hours. Sepsis Labs:  Recent Labs Lab 09/11/15 0758 09/11/15 1129  LATICACIDVEN 1.27 0.86    Recent Results (from the past 240 hour(s))  Blood Culture (routine x 2)     Status: Abnormal (Preliminary result)   Collection Time: 09/11/15  7:46 AM  Result Value Ref Range Status   Specimen Description BLOOD LEFT FOREARM  Final   Special Requests BOTTLES DRAWN AEROBIC AND ANAEROBIC 5ML  Final   Culture  Setup Time   Final    GRAM NEGATIVE RODS AEROBIC BOTTLE ONLY Organism ID to follow CRITICAL RESULT CALLED TO, READ BACK BY AND VERIFIED WITH: JULIAN GRIMSLEY,PHARMD @0357  09/12/15 MKELLY    Culture (A)  Final    ENTEROBACTER CLOACAE SUSCEPTIBILITIES TO FOLLOW Performed at [facility]    Report Status PENDING  Incomplete  Blood Culture (routine x 2)     Status: None (Preliminary result)   Collection Time: 09/11/15  7:46 AM  Result Value Ref Range Status   Specimen Description BLOOD LEFT FOREARM  Final   Special Requests BOTTLES DRAWN AEROBIC AND ANAEROBIC 5ML  Final   Culture   Final    NO GROWTH 1 DAY Performed at [facility]    Report Status PENDING  Incomplete  Blood Culture ID Panel (Reflexed)     Status: Abnormal   Collection Time: 09/11/15  7:46 AM  Result Value Ref Range Status   Enterococcus species NOT DETECTED NOT DETECTED Final   Vancomycin resistance NOT DETECTED NOT DETECTED Final   Listeria monocytogenes NOT DETECTED NOT DETECTED Final   Staphylococcus species NOT DETECTED NOT DETECTED Final   Staphylococcus aureus NOT DETECTED NOT DETECTED Final   Methicillin resistance NOT DETECTED NOT DETECTED Final   Streptococcus species NOT DETECTED NOT DETECTED Final   Streptococcus agalactiae NOT DETECTED NOT DETECTED Final   Streptococcus pneumoniae NOT DETECTED NOT DETECTED Final   Streptococcus pyogenes NOT DETECTED NOT DETECTED Final   Acinetobacter baumannii NOT DETECTED NOT DETECTED Final   Enterobacteriaceae  species DETECTED (A) NOT DETECTED Final    Comment: CRITICAL RESULT CALLED TO, READ BACK BY AND VERIFIED WITH: JULIAN GRIMSLEY,PHARMD @0357  09/12/15 MKELLY    Enterobacter cloacae complex DETECTED (A) NOT DETECTED Final    Comment: CRITICAL RESULT CALLED TO, READ BACK BY AND VERIFIED WITH: JULIAN GRIMSLEY,PHARMD @0357  09/12/15 MKELLY    Escherichia coli NOT DETECTED NOT DETECTED Final   Klebsiella oxytoca NOT DETECTED NOT DETECTED Final   Klebsiella pneumoniae NOT DETECTED NOT DETECTED Final   Proteus species NOT DETECTED NOT DETECTED Final   Serratia marcescens NOT DETECTED NOT DETECTED Final   Carbapenem resistance NOT DETECTED NOT DETECTED Final   Haemophilus influenzae NOT DETECTED NOT DETECTED Final   Neisseria meningitidis NOT DETECTED NOT DETECTED Final   Pseudomonas aeruginosa NOT DETECTED NOT DETECTED Final   Candida albicans NOT DETECTED NOT DETECTED Final  Candida glabrata NOT DETECTED NOT DETECTED Final   Candida krusei NOT DETECTED NOT DETECTED Final   Candida parapsilosis NOT DETECTED NOT DETECTED Final   Candida tropicalis NOT DETECTED NOT DETECTED Final    Comment: Performed at [facility]  Urine culture     Status: Abnormal   Collection Time: 09/11/15  7:53 AM  Result Value Ref Range Status   Specimen Description URINE, CLEAN CATCH  Final   Special Requests NONE  Final   Culture >=100,000 COLONIES/mL ENTEROCOCCUS SPECIES (A)  Final   Report Status 09/13/2015 FINAL  Final   Organism ID, Bacteria ENTEROCOCCUS SPECIES (A)  Final      Susceptibility   Enterococcus species - MIC*    AMPICILLIN <=2 SENSITIVE Sensitive     LEVOFLOXACIN 1 SENSITIVE Sensitive     NITROFURANTOIN <=16 SENSITIVE Sensitive     VANCOMYCIN 1 SENSITIVE Sensitive     * >=100,000 COLONIES/mL ENTEROCOCCUS SPECIES  Cath Tip Culture     Status: Abnormal (Preliminary result)   Collection Time: 09/11/15  4:12 PM  Result Value Ref Range Status   Specimen Description CATH TIP  Final    Special Requests NONE  Final   Culture >=100,000 COLONIES/mL ENTEROBACTER CLOACAE (A)  Final   Report Status PENDING  Incomplete   Organism ID, Bacteria ENTEROBACTER CLOACAE (A)  Final      Susceptibility   Enterobacter cloacae - MIC*    CEFAZOLIN >=64 RESISTANT Resistant     CEFEPIME <=1 SENSITIVE Sensitive     CEFTAZIDIME <=1 SENSITIVE Sensitive     CEFTRIAXONE <=1 SENSITIVE Sensitive     CIPROFLOXACIN <=0.25 SENSITIVE Sensitive     GENTAMICIN <=1 SENSITIVE Sensitive     IMIPENEM 0.5 SENSITIVE Sensitive     TRIMETH/SULFA <=20 SENSITIVE Sensitive     PIP/TAZO <=4 SENSITIVE Sensitive     * >=100,000 COLONIES/mL ENTEROBACTER CLOACAE         Radiology Studies: No results found.      Scheduled Meds: . buPROPion  300 mg Oral Daily  . ceFEPime (MAXIPIME) IV  2 g Intravenous Q8H  . enoxaparin (LOVENOX) injection  50 mg Subcutaneous Q24H  . multivitamin  15 mL Oral Daily  . pantoprazole  40 mg Oral Daily  . thiamine IV  100 mg Intravenous Daily  . topiramate  200 mg Oral BID   Continuous Infusions: . sodium chloride 125 mL/hr at 09/13/15 0832    Time spent: > 35 minutes  Velvet Bathe, MD [facility] Pager [phone]  If 7PM-7AM, please contact night-coverage [email or web address] Password [email or web address] 09/13/2015, 2:48 PM

## 2015-09-13 NOTE — Progress Notes (Signed)
Patient had verbally aggressive exchange with visitor.  She followed visitor onto elevator.  I stood by with patient as they exchanged verbally aggressive commentary in attempt to keep her safe until security arrived.  Visitor gave patient her house key at the end of verbal exchange.  Visitor left on elevator and patient walked to her room slamming the door.  Security arrived shortly after and spoke to patient, acknowledging that she no longer wants visitor to return.  Patient requested to have her IV infusion stopped while she was frustrated.  Complied with patient request.

## 2015-09-14 LAB — CULTURE, BLOOD (ROUTINE X 2)

## 2015-09-14 MED ORDER — CIPROFLOXACIN HCL 500 MG PO TABS: 500.0000 mg | ORAL_TABLET | Freq: Two times a day (BID) | ORAL | Status: DC

## 2015-09-14 NOTE — Care Management Note (Signed)
Case Management Note  Patient Details  Name: TEKARA WALLGREN MRN: [id number] Date of Birth: 03-Oct-1970  Subjective/Objective:    Bacteremia                Action/Plan: Discharge Planning: AVS reviewed:  Received call from [facility] and need [facility] RN orders. Contacted attending to update dc summary with orders for [facility] RN resumption of care. Unable to enter orders d/t pt dc past two hours.   Expected Discharge Date: 09/14/2015              Expected Discharge Plan:  [place]  In-House Referral:  NA  Discharge planning Services  CM Consult  Post Acute Care Choice:  Home Health, Resumption of Svcs/PTA Provider Choice offered to:  Patient  DME Arranged:  N/A DME Agency:  NA  HH Arranged:  RN [place] Agency:  [facility]  Status of Service:  Completed, signed off  If discussed at [place] of Stay Meetings, dates discussed:    Additional Comments:  Erenest Rasher, RN 09/14/2015, 12:29 PM

## 2015-09-14 NOTE — Progress Notes (Signed)
Patient ID: Margaret Fletcher, female   DOB: 1970-06-19, 45 y.o.   MRN: [id number] [facility] Surgery Progress Note:   * No surgery found *  Subjective: Mental status is clear.  Eager to go home and go to family reunion in [place] Objective: Vital signs in last 24 hours: Temp:  [97.8 F (36.6 C)-98.1 F (36.7 C)] 97.8 F (36.6 C) (07/08 0523) Pulse Rate:  [67-77] 67 (07/08 0523) Resp:  [16-18] 16 (07/08 0523) BP: (105-129)/(60-86) 105/60 mmHg (07/08 0523) SpO2:  [100 %] 100 % (07/08 0523)  Intake/Output from previous day: 07/07 0701 - 07/08 0700 In: 660 [P.O.:660] Out: -  Intake/Output this shift:    Physical Exam: Work of breathing is normal.  No abdominal complaints  Lab Results:  No results found for this or any previous visit (from the past 48 hour(s)).  Radiology/Results: No results found.  Anti-infectives: Anti-infectives    Start     Dose/Rate Route Frequency Ordered Stop   09/12/15 0500  ceFEPIme (MAXIPIME) 2 g in dextrose 5 % 50 mL IVPB     2 g 100 mL/hr over 30 Minutes Intravenous Every 8 hours 09/12/15 0413     09/11/15 2000  vancomycin (VANCOCIN) IVPB 750 mg/150 ml premix  Status:  Discontinued     750 mg 150 mL/hr over 60 Minutes Intravenous Every 12 hours 09/11/15 0734 09/12/15 0947   09/11/15 1600  piperacillin-tazobactam (ZOSYN) IVPB 3.375 g  Status:  Discontinued     3.375 g 12.5 mL/hr over 240 Minutes Intravenous Every 8 hours 09/11/15 0734 09/12/15 0413   09/11/15 0730  piperacillin-tazobactam (ZOSYN) IVPB 3.375 g     3.375 g 100 mL/hr over 30 Minutes Intravenous  Once 09/11/15 0722 09/11/15 0834   09/11/15 0730  vancomycin (VANCOCIN) IVPB 1000 mg/200 mL premix     1,000 mg 200 mL/hr over 60 Minutes Intravenous  Once 09/11/15 [id number] 09/11/15 0950      Assessment/Plan: Problem List: Patient Active Problem List   Diagnosis Date Noted  . Bacteremia 09/12/2015  . Fever 09/11/2015  . Protein-calorie malnutrition, mild ([place]) 08/19/2015  . Epigastric  pain 08/16/2015  . Dehydration 07/15/2015  . Prediabetes 07/08/2015  . Morbid obesity ([place]) 07/08/2015  . Mild obstructive sleep apnea 07/08/2015  . Dyslipidemia 07/08/2015  . S/P gastric bypass 07/08/2015  . IIH (idiopathic intracranial hypertension) 06/03/2015  . Chronic migraine without aura or status migrainosus 04/30/2015  . Worsening headaches 04/30/2015  . Vision changes 04/30/2015  . Perceived hearing changes 04/30/2015  . Liver lesion 03/21/2013  . Splenic cyst 03/21/2013  . Abdominal pain 03/21/2013    OK for discharge from surgery standpoint.  * No surgery found *    LOS: 2 days   Matt B. Hassell Done, MD, [facility] [phone] beeper [phone]  09/14/2015 8:07 AM

## 2015-09-14 NOTE — Discharge Summary (Addendum)
Physician Discharge Summary  Margaret Fletcher [id number] DOB: 01-16-1971 DOA: 09/11/2015  PCP: [name], MD  Admit date: 09/11/2015 Discharge date: 09/14/2015  Time spent: > 35 minutes  Recommendations for Outpatient Follow-up:  Monitor serum creatinine Patient will be discharged on cipro for 11 more days to complete a 14 day treatment total regimen  Discharge Diagnoses:  Principal Problem:   Bacteremia Active Problems:   Prediabetes   S/P gastric bypass   Protein-calorie malnutrition, mild (HCC)   Fever   Discharge Condition: stable  Diet recommendation: as tolerated and as per general surgery recommendations. Please refer to their notes for details.  Filed Weights   09/11/15 0707 09/11/15 0812 09/11/15 1128  Weight: 107.956 kg (238 lb) 107.956 kg (238 lb) 111.494 kg (245 lb 12.8 oz)    History of present illness:  45 y.o. female with medical history significant of IIH, migraine, alopecia, Autoinmune diseases ?, gastric bypass surgery on may 2017, malnutrition post surgery on TPN , has Piic line. Patient presents today with fevers with tempeture 102. Diagnosed with enterobacter bacteremia.  Hospital Course:  Enterobacter bacteremia - Picc line removed - d/c on oral antibiotics to complete a total of 14 days of therapy  Otherwise will continue home medication regimen for known medical problems listed above. TPN discontinued by General surgeon  Procedures:  None  Consultations:  General surgery  Discharge Exam: Filed Vitals:   09/13/15 2112 09/14/15 0523  BP: 126/83 105/60  Pulse: 71 67  Temp: 98.1 F (36.7 C) 97.8 F (36.6 C)  Resp: 18 16    General: Pt in nad, alert and awake Cardiovascular: rrr, no rubs Respiratory: no increased wob, no wheezes  Discharge Instructions   Discharge Instructions    AMB Referral to [place] [facility]    Complete by:  As directed   Please assign UMR member for post toc call. She is currently at [facility]. Thanks.  Marthenia Rolling, MSN-Ed, [facility] [phone]  Reason for consult:  Please assign UMR member for post toc. Currently at [facility]  Expected date of contact:  1-3 days (reserved for hospital discharges)     Call MD for:  extreme fatigue    Complete by:  As directed      Call MD for:  severe uncontrolled pain    Complete by:  As directed      Call MD for:  temperature >100.4    Complete by:  As directed      Diet - low sodium heart healthy    Complete by:  As directed      Increase activity slowly    Complete by:  As directed           Current Discharge Medication List    START taking these medications   Details  ciprofloxacin (CIPRO) 500 MG tablet Take 1 tablet (500 mg total) by mouth 2 (two) times daily. Qty: 22 tablet, Refills: 0      CONTINUE these medications which have NOT CHANGED   Details  ALPRAZolam (XANAX) 0.5 MG tablet Take 1 tablet (0.5 mg total) by mouth 2 (two) times daily as needed for anxiety. Can take 1-2 30 minutes before MRI. Do not drive. Qty: 30 tablet, Refills: 1   Associated Diagnoses: Adjustment disorder with mixed anxiety and depressed mood    buPROPion (WELLBUTRIN XL) 300 MG 24 hr tablet Take 1 tablet (300 mg total) by mouth daily. Qty: 90 tablet, Refills: 3   Associated Diagnoses: Adjustment disorder with  mixed anxiety and depressed mood    CALCIUM-VITAMIN D PO Take 15 mLs by mouth 3 (three) times daily.    Cyanocobalamin (VITAMIN B 12 PO) Take 1 tablet by mouth daily.    ondansetron (ZOFRAN-ODT) 4 MG disintegrating tablet Take 1 tablet (4 mg total) by mouth every 6 (six) hours as needed for nausea. Qty: 30 tablet, Refills: 0    oxyCODONE (ROXICODONE) 5 MG/5ML solution Take 2.5 mLs (2.5 mg total) by mouth every 4 (four) hours as needed for moderate pain or severe pain (before meals as needed for pain). Qty: 150 mL, Refills: 0    pantoprazole (PROTONIX) 40 MG tablet Take 40 mg by mouth daily. Refills: 1     SUMAtriptan (IMITREX) 20 MG/ACT nasal spray Place 1 spray (20 mg total) into the nose once. May repeat in 2 hours if headache persists or recurs. Qty: 10 Inhaler, Refills: 11    thiamine 123XX123 mg, folic acid 1 mg, multivitamins adult 10 mL in sodium chloride 0.9 % 1,000 mL Inject 1,000 mLs into the vein once. 1L IV every 3 days; first dose Sunday Qty: 12 Package, Refills: 1    topiramate (TOPAMAX) 100 MG tablet Take 2 tablets (200 mg total) by mouth at bedtime. Qty: 60 tablet, Refills: 11   Associated Diagnoses: IIH (idiopathic intracranial hypertension)      STOP taking these medications     propranolol (INDERAL) 10 MG tablet      SUMAtriptan (IMITREX) 100 MG tablet      TPN ADULT      sucralfate (CARAFATE) 1 GM/10ML suspension        Allergies  Allergen Reactions  . Sulfa Antibiotics Anaphylaxis  . Zosyn [Piperacillin Sod-Tazobactam So] Rash      The results of significant diagnostics from this hospitalization (including imaging, microbiology, ancillary and laboratory) are listed below for reference.    Significant Diagnostic Studies: Dg Chest 2 View  09/11/2015  CLINICAL DATA:  Fever, headaches EXAM: CHEST  2 VIEW COMPARISON:  None. FINDINGS: Right-sided PICC line with the tip projecting over the SVC. There is no focal parenchymal opacity. There is no pleural effusion or pneumothorax. The heart and mediastinal contours are unremarkable. The osseous structures are unremarkable. IMPRESSION: No active cardiopulmonary disease. Electronically Signed   By: Kathreen Devoid   On: 09/11/2015 08:00    Microbiology: Recent Results (from the past 240 hour(s))  Blood Culture (routine x 2)     Status: Abnormal (Preliminary result)   Collection Time: 09/11/15  7:46 AM  Result Value Ref Range Status   Specimen Description BLOOD LEFT FOREARM  Final   Special Requests BOTTLES DRAWN AEROBIC AND ANAEROBIC 5ML  Final   Culture  Setup Time   Final    GRAM NEGATIVE RODS AEROBIC BOTTLE  ONLY Organism ID to follow CRITICAL RESULT CALLED TO, READ BACK BY AND VERIFIED WITH: JULIAN GRIMSLEY,PHARMD @0357  09/12/15 MKELLY Performed at [facility]    Culture ENTEROBACTER CLOACAE (A)  Final   Report Status PENDING  Incomplete   Organism ID, Bacteria ENTEROBACTER CLOACAE  Final      Susceptibility   Enterobacter cloacae - MIC*    CEFAZOLIN >=64 RESISTANT Resistant     CEFEPIME <=1 SENSITIVE Sensitive     CEFTAZIDIME <=1 SENSITIVE Sensitive     CEFTRIAXONE <=1 SENSITIVE Sensitive     CIPROFLOXACIN <=0.25 SENSITIVE Sensitive     GENTAMICIN <=1 SENSITIVE Sensitive     IMIPENEM 0.5 SENSITIVE Sensitive     TRIMETH/SULFA <=20 SENSITIVE  Sensitive     PIP/TAZO <=4 SENSITIVE Sensitive     * ENTEROBACTER CLOACAE  Blood Culture (routine x 2)     Status: None (Preliminary result)   Collection Time: 09/11/15  7:46 AM  Result Value Ref Range Status   Specimen Description BLOOD LEFT FOREARM  Final   Special Requests BOTTLES DRAWN AEROBIC AND ANAEROBIC 5ML  Final   Culture   Final    NO GROWTH 1 DAY Performed at [facility]    Report Status PENDING  Incomplete  Blood Culture ID Panel (Reflexed)     Status: Abnormal   Collection Time: 09/11/15  7:46 AM  Result Value Ref Range Status   Enterococcus species NOT DETECTED NOT DETECTED Final   Vancomycin resistance NOT DETECTED NOT DETECTED Final   Listeria monocytogenes NOT DETECTED NOT DETECTED Final   Staphylococcus species NOT DETECTED NOT DETECTED Final   Staphylococcus aureus NOT DETECTED NOT DETECTED Final   Methicillin resistance NOT DETECTED NOT DETECTED Final   Streptococcus species NOT DETECTED NOT DETECTED Final   Streptococcus agalactiae NOT DETECTED NOT DETECTED Final   Streptococcus pneumoniae NOT DETECTED NOT DETECTED Final   Streptococcus pyogenes NOT DETECTED NOT DETECTED Final   Acinetobacter baumannii NOT DETECTED NOT DETECTED Final   Enterobacteriaceae species DETECTED (A) NOT DETECTED Final     Comment: CRITICAL RESULT CALLED TO, READ BACK BY AND VERIFIED WITH: JULIAN GRIMSLEY,PHARMD @0357  09/12/15 MKELLY    Enterobacter cloacae complex DETECTED (A) NOT DETECTED Final    Comment: CRITICAL RESULT CALLED TO, READ BACK BY AND VERIFIED WITH: JULIAN GRIMSLEY,PHARMD @0357  [id number] MKELLY    Escherichia coli NOT DETECTED NOT DETECTED Final   Klebsiella oxytoca NOT DETECTED NOT DETECTED Final   Klebsiella pneumoniae NOT DETECTED NOT DETECTED Final   Proteus species NOT DETECTED NOT DETECTED Final   Serratia marcescens NOT DETECTED NOT DETECTED Final   Carbapenem resistance NOT DETECTED NOT DETECTED Final   Haemophilus influenzae NOT DETECTED NOT DETECTED Final   Neisseria meningitidis NOT DETECTED NOT DETECTED Final   Pseudomonas aeruginosa NOT DETECTED NOT DETECTED Final   Candida albicans NOT DETECTED NOT DETECTED Final   Candida glabrata NOT DETECTED NOT DETECTED Final   Candida krusei NOT DETECTED NOT DETECTED Final   Candida parapsilosis NOT DETECTED NOT DETECTED Final   Candida tropicalis NOT DETECTED NOT DETECTED Final    Comment: Performed at [facility]  Urine culture     Status: Abnormal   Collection Time: 09/11/15  7:53 AM  Result Value Ref Range Status   Specimen Description URINE, CLEAN CATCH  Final   Special Requests NONE  Final   Culture >=100,000 COLONIES/mL ENTEROCOCCUS SPECIES (A)  Final   Report Status 09/13/2015 FINAL  Final   Organism ID, Bacteria ENTEROCOCCUS SPECIES (A)  Final      Susceptibility   Enterococcus species - MIC*    AMPICILLIN <=2 SENSITIVE Sensitive     LEVOFLOXACIN 1 SENSITIVE Sensitive     NITROFURANTOIN <=16 SENSITIVE Sensitive     VANCOMYCIN 1 SENSITIVE Sensitive     * >=100,000 COLONIES/mL ENTEROCOCCUS SPECIES  Cath Tip Culture     Status: Abnormal (Preliminary result)   Collection Time: 09/11/15  4:12 PM  Result Value Ref Range Status   Specimen Description CATH TIP  Final   Special Requests NONE  Final   Culture >=100,000  COLONIES/mL ENTEROBACTER CLOACAE (A)  Final   Report Status PENDING  Incomplete   Organism ID, Bacteria ENTEROBACTER CLOACAE (A)  Final  Susceptibility   Enterobacter cloacae - MIC*    CEFAZOLIN >=64 RESISTANT Resistant     CEFEPIME <=1 SENSITIVE Sensitive     CEFTAZIDIME <=1 SENSITIVE Sensitive     CEFTRIAXONE <=1 SENSITIVE Sensitive     CIPROFLOXACIN <=0.25 SENSITIVE Sensitive     GENTAMICIN <=1 SENSITIVE Sensitive     IMIPENEM 0.5 SENSITIVE Sensitive     TRIMETH/SULFA <=20 SENSITIVE Sensitive     PIP/TAZO <=4 SENSITIVE Sensitive     * >=100,000 COLONIES/mL ENTEROBACTER CLOACAE     Labs: Basic Metabolic Panel:  Recent Labs Lab 09/11/15 0746 09/12/15 0332  NA 137 139  K 4.1 3.8  CL 111 116*  CO2 19* 21*  GLUCOSE 116* 94  BUN 24* 13  CREATININE 1.07* 1.21*  CALCIUM 8.5* 7.7*  MG  --  1.9  PHOS  --  3.2   Liver Function Tests:  Recent Labs Lab 09/11/15 0746 09/12/15 0332  AST 22 26  ALT 27 33  ALKPHOS 54 48  BILITOT 0.4 0.4  PROT 6.9 5.8*  ALBUMIN 3.2* 2.6*    Recent Labs Lab 09/11/15 0746  LIPASE 31   No results for input(s): AMMONIA in the last 168 hours. CBC:  Recent Labs Lab 09/11/15 0746 09/12/15 0332  WBC 8.1 5.4  NEUTROABS 6.0  --   HGB 11.7* 10.7*  HCT 34.7* 32.6*  MCV 91.8 92.9  PLT 307 252   Cardiac Enzymes:  Recent Labs Lab 09/11/15 0746  CKTOTAL 87   BNP: BNP (last 3 results) No results for input(s): BNP in the last 8760 hours.  ProBNP (last 3 results) No results for input(s): PROBNP in the last 8760 hours.  CBG: No results for input(s): GLUCAP in the last 168 hours.   Signed:  Velvet Bathe MD.  [facility] 09/14/2015, 8:56 AM    Resume home health services and nursing and prior services prior to admission.

## 2015-09-15 LAB — CATH TIP CULTURE: Culture: >=100000 — AB

## 2015-09-16 ENCOUNTER — Other Ambulatory Visit: Payer: Self-pay | Admitting: *Deleted

## 2015-09-16 LAB — CULTURE, BLOOD (ROUTINE X 2): CULTURE: NO GROWTH

## 2015-09-16 NOTE — Patient Outreach (Signed)
[facility]) Care Management  09/16/2015  Margaret Fletcher 02/22/1971 [id number]   Subjective: Telephone call to patient's home number, no answer, left HIPAA compliant voicemail message, and requested call back.  Objective: Per chart review: Patient hospitalized 09/11/15 -09/14/15 for bacteremia and fever.   Patient also has a history of Prediabetes, status post gastric bypass, autoimmune disorder, total parenteral nutrition (TPN), alopecia,and Protein-calorie malnutrition.  Assessment: Received referral on 09/12/15 and patient still inpatient upon initial review.   Telephone screen, pending patient contact.  Plan: RNCM will call patient for telephone outreach, 2nd attempt, telephone screening, within 2 weeks, if no return call from patient.   Kirk Sampley H. Annia Friendly, BSN, [place] [facility] Telephonic CM Phone: [phone] Fax: [phone]

## 2015-09-17 ENCOUNTER — Other Ambulatory Visit: Payer: Self-pay | Admitting: *Deleted

## 2015-09-17 NOTE — Patient Outreach (Signed)
[name]) Care Management  09/17/2015  Margaret Fletcher Nov 19, 1970 [id number]  Subjective: Telephone call to patient's home / mobile number, no answer, left HIPAA compliant voicemail message, and requested call back.  Objective: Per chart review: Patient hospitalized 09/11/15 -09/14/15 for bacteremia and fever. Patient also has a history of Prediabetes, status post gastric bypass, autoimmune disorder, total parenteral nutrition (TPN), alopecia,and Protein-calorie malnutrition.  Assessment: Received referral on 09/12/15 and patient still inpatient upon initial review. Telephone screen, pending patient contact.  Plan: RNCM will call patient for telephone outreach, 3rd attempt, telephone screening, within 2 weeks, if no return call from patient.   Margaret Fletcher, BSN, [place] Management [facility] Telephonic CM Phone: [phone] Fax: [phone]

## 2015-09-18 ENCOUNTER — Other Ambulatory Visit: Payer: Self-pay | Admitting: *Deleted

## 2015-09-18 ENCOUNTER — Encounter: Payer: Self-pay | Admitting: *Deleted

## 2015-09-18 DIAGNOSIS — N62 Hypertrophy of breast: Secondary | ICD-10-CM | POA: Diagnosis not present

## 2015-09-18 NOTE — Patient Outreach (Signed)
[facility]) Care Management  09/18/2015  Margaret Fletcher 1970/06/28 [id number]  Subjective:  Telephone call to patient's home number/ mobile, spoke with patient, and HIPAA verified.   Patient states she is doing good.  Patient gave verbal authorization to speak with significant other ([name]) regarding her healthcare as needs as needed.   Discussed [facility] Care Management services and UMR transition of care follow up.  Patient voices understanding and is in agreement to complete follow up, and telephone screen.  Patient states will call MD's office this week to schedule hospital follow up appointments.   Patient voices understanding of the importance of MD follow up appointments.   States her surgeon (Dr. Redmond Pulling ) has been managing her care since the  surgery.  States her intravenous access was discontinued while in the hospital and is no longer on total parental nutrition. States Dr. Redmond Pulling advised her that if she was not able to keep fluid balance and got dehydrated then receiving intravenous fluids may be in option in an outpatient setting.   States she was given option of going to the [facility] for outpatient services if needed.   RNCM advised patient of signs and symptoms of dehydration, to call Dr. Dois Davenport office to coordinate care with [place], if she became dehydrated.   Patient voices understanding and states she is currently maintaining fluid balance and is aware of how to access care if needed.  Patient in agreement to receive EMMI information regarding dehydration.  Patient states she has exhausted family medical leave act and is currently out of work under medical leave.  Patient states she does not have any care coordination, disease monitoring, transition of care, disease education, transportation, or pharmacy needs at this time.   Objective: Per chart review: Patient hospitalized 09/11/15 -09/14/15 for bacteremia and fever. Patient also  has a history of Prediabetes, status post gastric bypass, autoimmune disorder, total parenteral nutrition (TPN), alopecia,and Protein-calorie malnutrition.  Assessment: Received referral on 09/12/15 and patient still inpatient upon initial review. Telephone screen and transition of care follow up completed.    Patient has no Telephonic RNCM needs at this time and will proceed with case closure.    Plan: RNCM will send patient successful outreach letter, [facility] pamphlet, magnet, and EMMI "Dehydration" handout .  RNCM will send case closure due to follow up completion / no care management needs request to [name].    Jaylen Knope H. Annia Friendly, BSN, [place] Management [facility] Telephonic CM Phone: [phone] Fax: [phone]

## 2015-09-25 ENCOUNTER — Ambulatory Visit: Payer: Self-pay | Admitting: General Surgery

## 2015-09-25 ENCOUNTER — Other Ambulatory Visit: Payer: Self-pay | Admitting: General Surgery

## 2015-09-25 NOTE — Patient Instructions (Addendum)
Margaret Fletcher  09/25/2015   Your procedure is scheduled on: 09-30-15  Report to [facility] Main  Entrance take [facility]  elevators to 3rd floor to  [name] at   1100 AM.  Call this number if you have problems the morning of surgery [phone]   Remember: ONLY 1 PERSON MAY GO WITH YOU TO SHORT STAY TO GET  READY MORNING OF [name].  Do not eat food or drink liquids :After Midnight. Clear liquids only all day 09-25-15.     Take these medicines the morning of surgery with A SIP OF WATER: Bupropion. Oxycodone. Propranolol. Zofran-if need. DO NOT TAKE ANY DIABETIC MEDICATIONS DAY OF YOUR SURGERY                               You may not have any metal on your body including hair pins and              piercings  Do not wear jewelry, make-up, lotions, powders or perfumes, deodorant             Do not wear nail polish.  Do not shave  48 hours prior to surgery.              Men may shave face and neck.   Do not bring valuables to the hospital. [name].  Contacts, dentures or bridgework may not be worn into surgery.  Leave suitcase in the car. After surgery it may be brought to your room.     Patients discharged the day of surgery will not be allowed to drive home.  Name and phone number of your driver: Margaret [phone] cell  Special Instructions: N/A              Please read over the following fact sheets you were given: _____________________________________________________________________             [facility] - Preparing for Surgery Before surgery, you can play an important role.  Because skin is not sterile, your skin needs to be as free of germs as possible.  You can reduce the number of germs on your skin by washing with CHG (chlorahexidine gluconate) soap before surgery.  CHG is an antiseptic cleaner which kills germs and bonds with the skin to continue killing germs even after  washing. Please DO NOT use if you have an allergy to CHG or antibacterial soaps.  If your skin becomes reddened/irritated stop using the CHG and inform your nurse when you arrive at Short Stay. Do not shave (including legs and underarms) for at least 48 hours prior to the first CHG shower.  You may shave your face/neck. Please follow these instructions carefully:  1.  Shower with CHG Soap the night before surgery and the  morning of Surgery.  2.  If you choose to wash your hair, wash your hair first as usual with your  normal  shampoo.  3.  After you shampoo, rinse your hair and body thoroughly to remove the  shampoo.                           4.  Use CHG as you would any  other liquid soap.  You can apply chg directly  to the skin and wash                       Gently with a scrungie or clean washcloth.  5.  Apply the CHG Soap to your body ONLY FROM THE NECK DOWN.   Do not use on face/ open                           Wound or open sores. Avoid contact with eyes, ears mouth and genitals (private parts).                       Wash face,  Genitals (private parts) with your normal soap.             6.  Wash thoroughly, paying special attention to the area where your surgery  will be performed.  7.  Thoroughly rinse your body with warm water from the neck down.  8.  DO NOT shower/wash with your normal soap after using and rinsing off  the CHG Soap.                9.  Pat yourself dry with a clean towel.            10.  Wear clean pajamas.            11.  Place clean sheets on your bed the night of your first shower and do not  sleep with pets. Day of Surgery : Do not apply any lotions/deodorants the morning of surgery.  Please wear clean clothes to the hospital/surgery center.  FAILURE TO FOLLOW THESE INSTRUCTIONS MAY RESULT IN THE CANCELLATION OF YOUR SURGERY PATIENT SIGNATURE_________________________________  NURSE  SIGNATURE__________________________________  ________________________________________________________________________

## 2015-09-25 NOTE — H&P (Signed)
Margaret Fletcher 09/25/2015 11:10 AM Location: [facility] Patient #: [id number] DOB: 16-Apr-1970 Divorced / Language: [name] / Race: Black or African American Female  History of Present Illness [name] M. Ernan Runkles MD; 09/25/2015 2:25 PM) The patient is a 45 year old female presenting status-post bariatric surgery. She comes in for ongoing follow-up after laparoscopic Roux-en-Y gastric bypass from May 1. She has had difficulty with oral intake due to persistent epigastric discomfort and feelings of fullness. She has had upper GI, CT scan, EGD to evaluate for problems and those have been normal. Most recently she was admitted to [facility] long from July 5 through the eighth with line sepsis from her PICC line. She grew out enterococcus. She is still on oral antibiotics. She denies any fevers or chills. She denies any vomiting. She is still having ongoing difficulty with oral intake. Some days are better than others. She will generally get in at least one shake per day as well as some liquid. She has an ongoing sensation of feeling full in her upper abdomen. She no longer has a raw sensation in her upper abdomen. She feels that certain liquids or foods weigh heavy on her stomach. Since she has had the PICC line removed and has been treated for bacteremia she states that she feels better. She reports less fatigue. She states some days her urine is concentrated. She states that she had 2 bites of egg salad and it felt like it just sat in her upper abdomen for a day. She is accompanied by family member.   08/29/15 Because she has had ongoing discomfort I arranged an upper endoscopy on June 9. Her upper endoscopy revealed perhaps some mild distal esophagitis. Her anastomosis is widely patent. There was no sign of marginal ulcer. She had 2 staples that were visible at the anastomosis however the surrounding mucosa was intact. She was admitted afterwards and kept over the weekend so we  could start working on TPN because her nutritional status had started to become compromised. She was sent home with continuous TPN on the 12th. There was an issue with lab draws where the labs were drawn incorrectly resulting in artificially elevated abnormalities. She states that she is still having difficulty with liquid intake. She will have good days and bad days. She will tolerate stuff for a few days and then have issues. It is the ongoing sensation of a full sensation in her upper abdomen. It feels like it goes down hard. She describes it as a heavy sensation. She denies any regurgitation, vomiting. She has a little bit more discomfort with solids than liquids. She is taking Protonix, Carafate, nausea medicine as needed. She is also occasionally pre-medicating with oral intake with a small dose of oxycodone.  5/28 /17 She comes in for follow-up after undergoing laparoscopic Roux-en-Y gastric bypass. She had surgery on May 1. Her preoperative weight was 273 pounds. Her postop course was complicated by being readmitted on May 8 through the 12 for difficulty with oral intake. She described it as epigastric discomfort. A repeat upper GI demonstrated no evidence of leak. There was prompt passage of contrast through her pouch into her Roux limb. Her blood count was normal. She had no fever or tachycardia. There is no suspicion of the postoperative leak. A PICC line was placed and she was sent home with intermittent IV fluid administration 3 times a week. I last saw her last Friday on 5/19. Her weight was 253 lbs. She states that she is  doing about same. She is staying on liquids as instructed. Last week the epigastric discomfort was a 5 or 6 now it is a 3-4. She describes it as a upper tummy ache. She did actually have a flare last evening when she was chewing some grilled chicken for flavor and inadvertently swallowed some and it caused significant epigastric pain within 43min of  swallowing it. She denies any left upper quadrant all shoulder pain. She denies any fever or chills. She is having flatus and having bowel movements. She is taking her supplements without difficulty. She is alternating between pushing water and pushing protein shakes. She is getting 1 protein shake per day +water. does better with ice. thicker liquids are more challenging. she reports she had labs this past Monday but didn't bring copy of report. Some days are better than others. She denies any heartburn or indigestion. She is taking Protonix and carafate not religiously. doesn't think it helps.  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) ALOPECIA (L65.9) MILD OBSTRUCTIVE SLEEP APNEA (G47.33)  Other Problems [name], MD; 09/25/2015 2:29 PM) Hemorrhoids Anxiety Disorder Migraine Headache  Past Surgical History [name], MD; 09/25/2015 2:29 PM) Cesarean Section - Multiple Oral Surgery Appendectomy Gallbladder Surgery - Laparoscopic  Diagnostic Studies History [name], MD; 09/25/2015 2:29 PM) Pap Smear 1-5 years ago Colonoscopy never Mammogram within last year  Allergies [name], [name]; 09/25/2015 11:11 AM) Sulfa Antibiotics  Medication History [name], [name]; 09/25/2015 11:11 AM) Zofran ODT (4MG  Tablet Disperse, 1 (one) Oral every six hours, as needed, Taken starting  08/29/2015) Active. Vitamin D (Ergocalciferol) (50000UNIT Capsule, 1 (one) Capsule Oral once a week, Taken starting 07/03/2015) Active. Protonix (40MG  Tablet DR, 1 (one) Oral daily, Taken starting 06/26/2015) Active. Biotin Active. Wellbutrin (75MG  Tablet, Oral) Active. Calcium + D (150-261-330MG -MG-IU Capsule, Oral) Active. Vitamin B12 (100MCG Tablet, Oral) Active. Magnesium Carbonate (250MG  Capsule, Oral) Active. Multiple Vitamin (Oral) Active. Vitamin B6 (200MG  Tablet, Oral) Active. Imitrex (25MG  Tablet, Oral) Active. Topiramate (100MG  Tablet, Oral) Active. Medications Reconciled  Social History [name], MD; 09/25/2015 2:29 PM) Alcohol use Occasional alcohol use. No drug use Tobacco use Never smoker. Caffeine use Carbonated beverages, Coffee, Tea.  Family History [name], MD; 09/25/2015 2:29 PM) Seizure disorder Son. Cerebrovascular Accident Father. Diabetes Mellitus Brother. Heart disease in female family member before age 49 Heart disease in female family member before age 75 Colon Polyps Father. Breast Cancer Mother.  Pregnancy / Birth History [name], MD; 09/25/2015 2:29 PM) Age at menarche 51 years. Para 2 Maternal age 66-25 Gravida 3 Irregular periods    Vitals [name] [name]; 09/25/2015 11:11 AM) 09/25/2015 11:11 AM Weight: 236 lb Height: 67.5in Body Surface Area: 2.18 m Body Mass Index: 36.42 kg/m  Temp.: 97.78F(Temporal)  Pulse: 88 (Regular)  BP: 128/72 (Sitting, Left Arm, Standard)      Physical Exam [name] M. Noelly Lasseigne MD; 09/25/2015 2:26 PM)  General Mental Status-Alert. General Appearance-Consistent with stated age. Hydration-Well hydrated. Voice-Normal.  Chest and Lung Exam Chest and lung exam reveals -quiet, even and  easy respiratory effort with no use of accessory muscles. Inspection Chest Wall - Normal. Back - normal. Auscultation Breath sounds -  Normal.  Cardiovascular Auscultation Rhythm - Regular.  Abdomen Inspection Skin - Scar - Note: Incision: clean, dry, and intact. Palpation/Percussion Palpation and Percussion of the abdomen reveal - Soft, Non Tender, No Rebound tenderness and No Rigidity (guarding). Auscultation Auscultation of the abdomen reveals - Bowel sounds normal.  Neuropsychiatric Orientation-oriented X3. The patient's mood and affect are described as -not angry, depressed, not anxious, not agitated. Judgment and Insight-insight is appropriate concerning matters relevant to self, the patient displays appropriate judgment regarding every day activities and judgment is appropriate in social situations.  Musculoskeletal Normal Exam - Left-Upper Extremity Strength Normal and Lower Extremity Strength Normal. Normal Exam - Right-Upper Extremity Strength Normal and Lower Extremity Strength Normal.    Assessment & Plan [name] M. 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 She does not look dehydrated. However I do think she could benefit from IV fluid hydration. We will arrange for her to go to the sickle cell hydration clinic tomorrow after her preoperative appointment. It is unclear as to the etiology of her continued ongoing epigastric sensation of fullness and heaviness. There've been no significant abnormalities found on upper GI, CT, or upper endoscopy. Because of her ongoing inability to get adequate nutrition by mouth and the fact that she has had bacteremia from line sepsis I feel the only way at this point to get her adequate nutrition is to place a gastrostomy tube in her excluded stomach. I also recommend a diagnostic laparoscopy to otherwise complete her workup to figure out a cause for her ongoing epigastric discomfort. We did talk about the possibility of laparoscopic resection of the blind end of her roux limb. We discussed the risk and  benefits of surgery including but not limited to bleeding, infection, injury to surrounding structures, blood clot formation, gastrostomy tube complication such as leak, skin infection, the G-tube coming out, perioperative cardiac and pulmonary events, as well as failure to ameliorate her abdominal discomfort. She is currently scheduled for surgery on Monday. She was instructed to contact the office should she have any questions between now and surgery. I think this will also give her a little bit of a break from trying to push liquids by mouth and help with some of her depression.  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 Redmond Pulling, MD, FACS General, Bariatric, & Minimally Invasive Surgery [facility], [place]

## 2015-09-26 ENCOUNTER — Encounter (HOSPITAL_COMMUNITY)
Admission: RE | Admit: 2015-09-26 | Discharge: 2015-09-26 | Disposition: A | Discharge status: Home / Self Care | Payer: 59 | Source: Ambulatory Visit | Attending: General Surgery | Admitting: General Surgery

## 2015-09-26 ENCOUNTER — Encounter (HOSPITAL_COMMUNITY): Payer: Self-pay

## 2015-09-26 ENCOUNTER — Ambulatory Visit (HOSPITAL_COMMUNITY)
Admission: RE | Admit: 2015-09-26 | Discharge: 2015-09-26 | Disposition: A | Discharge status: Home / Self Care | Payer: 59 | Source: Ambulatory Visit | Attending: General Surgery | Admitting: General Surgery

## 2015-09-26 DIAGNOSIS — Z6835 Body mass index (BMI) 35.0-35.9, adult: Secondary | ICD-10-CM | POA: Insufficient documentation

## 2015-09-26 DIAGNOSIS — E860 Dehydration: Secondary | ICD-10-CM | POA: Diagnosis not present

## 2015-09-26 DIAGNOSIS — Z01812 Encounter for preprocedural laboratory examination: Secondary | ICD-10-CM | POA: Insufficient documentation

## 2015-09-26 DIAGNOSIS — R109 Unspecified abdominal pain: Secondary | ICD-10-CM | POA: Diagnosis not present

## 2015-09-26 DIAGNOSIS — E46 Unspecified protein-calorie malnutrition: Secondary | ICD-10-CM | POA: Insufficient documentation

## 2015-09-26 DIAGNOSIS — Z9884 Bariatric surgery status: Secondary | ICD-10-CM | POA: Diagnosis not present

## 2015-09-26 LAB — CBC WITH DIFFERENTIAL/PLATELET
Basophils Absolute: 0.1 10*3/uL (ref 0.0–0.1)
Basophils Relative: 1 %
EOS ABS: 0.3 10*3/uL (ref 0.0–0.7)
Eosinophils Relative: 5 %
HEMATOCRIT: 39.1 % (ref 36.0–46.0)
HEMOGLOBIN: 12.7 g/dL (ref 12.0–15.0)
LYMPHS ABS: 1.9 10*3/uL (ref 0.7–4.0)
LYMPHS PCT: 27 %
MCH: 30.2 pg (ref 26.0–34.0)
MCHC: 32.5 g/dL (ref 30.0–36.0)
MCV: 92.9 fL (ref 78.0–100.0)
MONOS PCT: 7 %
Monocytes Absolute: 0.5 10*3/uL (ref 0.1–1.0)
NEUTROS ABS: 4.1 10*3/uL (ref 1.7–7.7)
NEUTROS PCT: 60 %
Platelets: 341 10*3/uL (ref 150–400)
RBC: 4.21 MIL/uL (ref 3.87–5.11)
RDW: 14.6 % (ref 11.5–15.5)
WBC: 6.9 10*3/uL (ref 4.0–10.5)

## 2015-09-26 LAB — COMPREHENSIVE METABOLIC PANEL
ALK PHOS: 58 U/L (ref 38–126)
ALT: 22 U/L (ref 14–54)
ANION GAP: 7 (ref 5–15)
AST: 22 U/L (ref 15–41)
Albumin: 3.7 g/dL (ref 3.5–5.0)
BILIRUBIN TOTAL: 0.4 mg/dL (ref 0.3–1.2)
BUN: 17 mg/dL (ref 6–20)
CALCIUM: 8.7 mg/dL — AB (ref 8.9–10.3)
CO2: 25 mmol/L (ref 22–32)
CREATININE: 1.04 mg/dL — AB (ref 0.44–1.00)
Chloride: 106 mmol/L (ref 101–111)
GFR calc non Af Amer: >60 mL/min (ref >60)
Glucose, Bld: 94 mg/dL (ref 65–99)
Potassium: 4.4 mmol/L (ref 3.5–5.1)
SODIUM: 138 mmol/L (ref 135–145)
TOTAL PROTEIN: 7.3 g/dL (ref 6.5–8.1)

## 2015-09-26 LAB — HCG, SERUM, QUALITATIVE: Preg, Serum: NEGATIVE

## 2015-09-26 MED ORDER — THIAMINE HCL 100 MG/ML IJ SOLN
INTRAVENOUS | Status: DC
  Administered 2015-09-26: 14:00:00 via INTRAVENOUS
  Filled 2015-09-26 (×5): qty 1000

## 2015-09-26 NOTE — Pre-Procedure Instructions (Signed)
EKG/ CXR 09-11-15 Epic. Labs CBC,CMP 09-12-15 Epic. Labs done today per MD order.

## 2015-09-26 NOTE — Progress Notes (Signed)
Patient ID: Margaret Fletcher, female   DOB: 01-11-1971, 45 y.o.   MRN: [id number]  Medical Provider: Greer Pickerel MD  Associated Diagnosis: Dehydration  Procedure: Infusion of 1000 ml 0.9% Normal Saline with thiamine, multivitamins and Folic acid added. Infused at 250 ml/hr via PIV. Tolerated infusion well.   Went over discharge instructions and copy given to patient. Alert, oriented and ambulatory at time of discharge. Discharged to home with family member.

## 2015-09-26 NOTE — Discharge Instructions (Signed)
Infusion of 1000 ml normal saline with thiamine, folic acid and multivitamin.

## 2015-09-30 ENCOUNTER — Encounter (HOSPITAL_COMMUNITY): Payer: Self-pay | Admitting: *Deleted

## 2015-09-30 ENCOUNTER — Inpatient Hospital Stay (HOSPITAL_COMMUNITY): Payer: 59 | Admitting: Certified Registered Nurse Anesthetist

## 2015-09-30 ENCOUNTER — Encounter (HOSPITAL_COMMUNITY)
Admission: RE | Disposition: A | Discharge status: Home / Self Care | Payer: Self-pay | Source: Ambulatory Visit | Attending: General Surgery

## 2015-09-30 ENCOUNTER — Inpatient Hospital Stay (HOSPITAL_COMMUNITY)
Admission: RE | Admit: 2015-09-30 | Discharge: 2015-10-09 | DRG: 329 | Disposition: A | Discharge status: Home / Self Care | Payer: 59 | Source: Ambulatory Visit | Attending: General Surgery | Admitting: General Surgery

## 2015-09-30 DIAGNOSIS — K565 Intestinal adhesions [bands] with obstruction (postprocedural) (postinfection): Secondary | ICD-10-CM | POA: Diagnosis not present

## 2015-09-30 DIAGNOSIS — E441 Mild protein-calorie malnutrition: Secondary | ICD-10-CM | POA: Diagnosis not present

## 2015-09-30 DIAGNOSIS — K219 Gastro-esophageal reflux disease without esophagitis: Secondary | ICD-10-CM | POA: Diagnosis present

## 2015-09-30 DIAGNOSIS — Y848 Other medical procedures as the cause of abnormal reaction of the patient, or of later complication, without mention of misadventure at the time of the procedure: Secondary | ICD-10-CM | POA: Diagnosis present

## 2015-09-30 DIAGNOSIS — R7303 Prediabetes: Secondary | ICD-10-CM | POA: Diagnosis present

## 2015-09-30 DIAGNOSIS — F329 Major depressive disorder, single episode, unspecified: Secondary | ICD-10-CM | POA: Diagnosis present

## 2015-09-30 DIAGNOSIS — R1013 Epigastric pain: Secondary | ICD-10-CM | POA: Diagnosis not present

## 2015-09-30 DIAGNOSIS — L659 Nonscarring hair loss, unspecified: Secondary | ICD-10-CM | POA: Diagnosis present

## 2015-09-30 DIAGNOSIS — R1012 Left upper quadrant pain: Secondary | ICD-10-CM | POA: Diagnosis not present

## 2015-09-30 DIAGNOSIS — Z809 Family history of malignant neoplasm, unspecified: Secondary | ICD-10-CM

## 2015-09-30 DIAGNOSIS — E46 Unspecified protein-calorie malnutrition: Secondary | ICD-10-CM | POA: Diagnosis not present

## 2015-09-30 DIAGNOSIS — G4733 Obstructive sleep apnea (adult) (pediatric): Secondary | ICD-10-CM | POA: Diagnosis present

## 2015-09-30 DIAGNOSIS — I472 Ventricular tachycardia: Secondary | ICD-10-CM | POA: Diagnosis not present

## 2015-09-30 DIAGNOSIS — K9589 Other complications of other bariatric procedure: Secondary | ICD-10-CM | POA: Diagnosis present

## 2015-09-30 DIAGNOSIS — Z9049 Acquired absence of other specified parts of digestive tract: Secondary | ICD-10-CM

## 2015-09-30 DIAGNOSIS — K9423 Gastrostomy malfunction: Secondary | ICD-10-CM | POA: Diagnosis not present

## 2015-09-30 DIAGNOSIS — G932 Benign intracranial hypertension: Secondary | ICD-10-CM | POA: Diagnosis present

## 2015-09-30 DIAGNOSIS — E43 Unspecified severe protein-calorie malnutrition: Secondary | ICD-10-CM | POA: Diagnosis present

## 2015-09-30 DIAGNOSIS — Z87440 Personal history of urinary (tract) infections: Secondary | ICD-10-CM

## 2015-09-30 DIAGNOSIS — Y833 Surgical operation with formation of external stoma as the cause of abnormal reaction of the patient, or of later complication, without mention of misadventure at the time of the procedure: Secondary | ICD-10-CM | POA: Diagnosis not present

## 2015-09-30 DIAGNOSIS — K9429 Other complications of gastrostomy: Secondary | ICD-10-CM | POA: Diagnosis not present

## 2015-09-30 DIAGNOSIS — F419 Anxiety disorder, unspecified: Secondary | ICD-10-CM | POA: Diagnosis present

## 2015-09-30 DIAGNOSIS — R109 Unspecified abdominal pain: Secondary | ICD-10-CM | POA: Diagnosis not present

## 2015-09-30 DIAGNOSIS — E784 Other hyperlipidemia: Secondary | ICD-10-CM | POA: Diagnosis present

## 2015-09-30 DIAGNOSIS — E669 Obesity, unspecified: Secondary | ICD-10-CM | POA: Diagnosis present

## 2015-09-30 DIAGNOSIS — Z9884 Bariatric surgery status: Secondary | ICD-10-CM | POA: Diagnosis not present

## 2015-09-30 DIAGNOSIS — Z882 Allergy status to sulfonamides status: Secondary | ICD-10-CM

## 2015-09-30 DIAGNOSIS — Z79899 Other long term (current) drug therapy: Secondary | ICD-10-CM | POA: Diagnosis not present

## 2015-09-30 DIAGNOSIS — E785 Hyperlipidemia, unspecified: Secondary | ICD-10-CM | POA: Diagnosis present

## 2015-09-30 DIAGNOSIS — E860 Dehydration: Secondary | ICD-10-CM | POA: Diagnosis present

## 2015-09-30 DIAGNOSIS — T85528A Displacement of other gastrointestinal prosthetic devices, implants and grafts, initial encounter: Secondary | ICD-10-CM

## 2015-09-30 DIAGNOSIS — Z6835 Body mass index (BMI) 35.0-35.9, adult: Secondary | ICD-10-CM | POA: Diagnosis not present

## 2015-09-30 DIAGNOSIS — E559 Vitamin D deficiency, unspecified: Secondary | ICD-10-CM | POA: Diagnosis present

## 2015-09-30 DIAGNOSIS — Z431 Encounter for attention to gastrostomy: Secondary | ICD-10-CM

## 2015-09-30 DIAGNOSIS — Z823 Family history of stroke: Secondary | ICD-10-CM | POA: Diagnosis not present

## 2015-09-30 DIAGNOSIS — Z833 Family history of diabetes mellitus: Secondary | ICD-10-CM

## 2015-09-30 DIAGNOSIS — Z4682 Encounter for fitting and adjustment of non-vascular catheter: Secondary | ICD-10-CM | POA: Diagnosis not present

## 2015-09-30 HISTORY — PX: LAPAROSCOPY: SHX197

## 2015-09-30 HISTORY — PX: LAPAROSCOPIC GASTROSTOMY: SHX5896

## 2015-09-30 HISTORY — PX: LAPAROSCOPIC SMALL BOWEL RESECTION: SHX5929

## 2015-09-30 SURGERY — LAPAROSCOPY, DIAGNOSTIC
Anesthesia: General | Site: Abdomen

## 2015-09-30 MED ORDER — FENTANYL CITRATE (PF) 250 MCG/5ML IJ SOLN
INTRAMUSCULAR | Status: AC
  Filled 2015-09-30: qty 5

## 2015-09-30 MED ORDER — CIPROFLOXACIN IN D5W 400 MG/200ML IV SOLN
400.0000 mg | INTRAVENOUS | Status: AC
  Administered 2015-09-30: 400 mg via INTRAVENOUS

## 2015-09-30 MED ORDER — DIPHENHYDRAMINE HCL 50 MG/ML IJ SOLN
12.5000 mg | Freq: Three times a day (TID) | INTRAMUSCULAR | Status: DC | PRN
  Administered 2015-09-30 – 2015-10-01 (×2): 12.5 mg via INTRAVENOUS
  Filled 2015-09-30 (×3): qty 1

## 2015-09-30 MED ORDER — PROPOFOL 10 MG/ML IV BOLUS
INTRAVENOUS | Status: DC | PRN
  Administered 2015-09-30: 200 mg via INTRAVENOUS

## 2015-09-30 MED ORDER — LIDOCAINE HCL (CARDIAC) 20 MG/ML IV SOLN
INTRAVENOUS | Status: DC | PRN
  Administered 2015-09-30: 100 mg via INTRAVENOUS

## 2015-09-30 MED ORDER — SUGAMMADEX SODIUM 500 MG/5ML IV SOLN
INTRAVENOUS | Status: AC
  Filled 2015-09-30: qty 5

## 2015-09-30 MED ORDER — ROCURONIUM BROMIDE 100 MG/10ML IV SOLN
INTRAVENOUS | Status: AC
  Filled 2015-09-30: qty 1

## 2015-09-30 MED ORDER — MORPHINE SULFATE (PF) 2 MG/ML IV SOLN
2.0000 mg | INTRAVENOUS | Status: DC | PRN
  Administered 2015-09-30 – 2015-10-01 (×3): 2 mg via INTRAVENOUS
  Administered 2015-10-01 – 2015-10-02 (×5): 4 mg via INTRAVENOUS
  Administered 2015-10-03 (×3): 6 mg via INTRAVENOUS
  Administered 2015-10-03: 4 mg via INTRAVENOUS
  Administered 2015-10-03: 6 mg via INTRAVENOUS
  Administered 2015-10-03: 4 mg via INTRAVENOUS
  Administered 2015-10-04: 6 mg via INTRAVENOUS
  Administered 2015-10-04: 2 mg via INTRAVENOUS
  Administered 2015-10-04: 6 mg via INTRAVENOUS
  Administered 2015-10-05: 4 mg via INTRAVENOUS
  Administered 2015-10-05: 2 mg via INTRAVENOUS
  Administered 2015-10-05: 4 mg via INTRAVENOUS
  Administered 2015-10-05: 2 mg via INTRAVENOUS
  Administered 2015-10-06 (×2): 4 mg via INTRAVENOUS
  Administered 2015-10-06: 6 mg via INTRAVENOUS
  Administered 2015-10-06 (×2): 4 mg via INTRAVENOUS
  Administered 2015-10-07 – 2015-10-09 (×2): 2 mg via INTRAVENOUS
  Filled 2015-09-30: qty 2
  Filled 2015-09-30 (×2): qty 3
  Filled 2015-09-30: qty 1
  Filled 2015-09-30 (×2): qty 2
  Filled 2015-09-30: qty 1
  Filled 2015-09-30: qty 2
  Filled 2015-09-30: qty 1
  Filled 2015-09-30: qty 3
  Filled 2015-09-30 (×2): qty 2
  Filled 2015-09-30: qty 1
  Filled 2015-09-30: qty 3
  Filled 2015-09-30: qty 2
  Filled 2015-09-30: qty 3
  Filled 2015-09-30: qty 1
  Filled 2015-09-30: qty 3
  Filled 2015-09-30 (×2): qty 1
  Filled 2015-09-30: qty 2
  Filled 2015-09-30 (×2): qty 3
  Filled 2015-09-30 (×3): qty 2
  Filled 2015-09-30: qty 1
  Filled 2015-09-30: qty 3
  Filled 2015-09-30: qty 2

## 2015-09-30 MED ORDER — ACETAMINOPHEN 160 MG/5ML PO SOLN: 650.0000 mg | ORAL | Status: DC | PRN

## 2015-09-30 MED ORDER — MIDAZOLAM HCL 2 MG/2ML IJ SOLN
INTRAMUSCULAR | Status: AC
Start: 2015-09-30 — End: 2015-09-30
  Filled 2015-09-30: qty 2

## 2015-09-30 MED ORDER — ROCURONIUM BROMIDE 100 MG/10ML IV SOLN
INTRAVENOUS | Status: DC | PRN
  Administered 2015-09-30: 20 mg via INTRAVENOUS
  Administered 2015-09-30: 10 mg via INTRAVENOUS
  Administered 2015-09-30: 30 mg via INTRAVENOUS

## 2015-09-30 MED ORDER — CHLORHEXIDINE GLUCONATE 4 % EX LIQD: 60.0000 mL | Freq: Once | CUTANEOUS | Status: DC

## 2015-09-30 MED ORDER — SODIUM CHLORIDE 0.9 % IJ SOLN
INTRAMUSCULAR | Status: AC
  Filled 2015-09-30: qty 50

## 2015-09-30 MED ORDER — LORAZEPAM 2 MG/ML IJ SOLN
1.0000 mg | Freq: Three times a day (TID) | INTRAMUSCULAR | Status: DC | PRN
  Administered 2015-10-03 – 2015-10-08 (×6): 1 mg via INTRAVENOUS
  Filled 2015-09-30 (×6): qty 1

## 2015-09-30 MED ORDER — FLUCONAZOLE IN SODIUM CHLORIDE 200-0.9 MG/100ML-% IV SOLN
150.0000 mg | INTRAVENOUS | Status: AC
  Administered 2015-09-30: 150 mg via INTRAVENOUS
  Filled 2015-09-30: qty 75

## 2015-09-30 MED ORDER — LIDOCAINE HCL (CARDIAC) 20 MG/ML IV SOLN
INTRAVENOUS | Status: AC
  Filled 2015-09-30: qty 5

## 2015-09-30 MED ORDER — PREMIER PROTEIN SHAKE
2.0000 [oz_av] | ORAL | Status: DC
  Administered 2015-10-02 – 2015-10-08 (×32): 2 [oz_av] via ORAL
  Filled 2015-09-30 (×18): qty 325.31

## 2015-09-30 MED ORDER — ACETAMINOPHEN 10 MG/ML IV SOLN
INTRAVENOUS | Status: AC
  Administered 2015-09-30: 1000 mg via INTRAVENOUS
  Filled 2015-09-30: qty 100

## 2015-09-30 MED ORDER — ACETAMINOPHEN 160 MG/5ML PO SOLN
325.0000 mg | ORAL | Status: DC | PRN
  Administered 2015-10-07: 325 mg via ORAL
  Filled 2015-09-30: qty 20.3

## 2015-09-30 MED ORDER — DEXAMETHASONE SODIUM PHOSPHATE 10 MG/ML IJ SOLN
INTRAMUSCULAR | Status: DC | PRN
  Administered 2015-09-30: 10 mg via INTRAVENOUS

## 2015-09-30 MED ORDER — ENOXAPARIN SODIUM 30 MG/0.3ML ~~LOC~~ SOLN
30.0000 mg | Freq: Two times a day (BID) | SUBCUTANEOUS | Status: DC
  Administered 2015-10-01 – 2015-10-03 (×5): 30 mg via SUBCUTANEOUS
  Filled 2015-09-30 (×6): qty 0.3

## 2015-09-30 MED ORDER — PROPOFOL 10 MG/ML IV BOLUS
INTRAVENOUS | Status: AC
  Filled 2015-09-30: qty 20

## 2015-09-30 MED ORDER — POTASSIUM CHLORIDE IN NACL 20-0.45 MEQ/L-% IV SOLN
INTRAVENOUS | Status: DC
  Administered 2015-09-30: 1000 mL via INTRAVENOUS
  Administered 2015-10-01 – 2015-10-02 (×3): via INTRAVENOUS
  Administered 2015-10-02: 125 mL/h via INTRAVENOUS
  Administered 2015-10-03 (×3): via INTRAVENOUS
  Administered 2015-10-03 – 2015-10-04 (×2): 125 mL/h via INTRAVENOUS
  Administered 2015-10-05 – 2015-10-08 (×7): via INTRAVENOUS
  Filled 2015-09-30 (×24): qty 1000

## 2015-09-30 MED ORDER — SUCCINYLCHOLINE CHLORIDE 20 MG/ML IJ SOLN
INTRAMUSCULAR | Status: DC | PRN
  Administered 2015-09-30: 100 mg via INTRAVENOUS

## 2015-09-30 MED ORDER — DEXAMETHASONE SODIUM PHOSPHATE 10 MG/ML IJ SOLN
INTRAMUSCULAR | Status: AC
  Filled 2015-09-30: qty 1

## 2015-09-30 MED ORDER — PROPRANOLOL HCL 10 MG PO TABS
10.0000 mg | ORAL_TABLET | Freq: Two times a day (BID) | ORAL | Status: DC
  Administered 2015-09-30: 10 mg via ORAL
  Filled 2015-09-30 (×4): qty 1

## 2015-09-30 MED ORDER — PROMETHAZINE HCL 25 MG/ML IJ SOLN
INTRAMUSCULAR | Status: AC
  Administered 2015-09-30: 12.5 mg via INTRAVENOUS
  Filled 2015-09-30: qty 1

## 2015-09-30 MED ORDER — CIPROFLOXACIN IN D5W 400 MG/200ML IV SOLN
INTRAVENOUS | Status: AC
  Filled 2015-09-30: qty 200

## 2015-09-30 MED ORDER — ONDANSETRON HCL 4 MG/2ML IJ SOLN
4.0000 mg | INTRAMUSCULAR | Status: DC | PRN
  Administered 2015-09-30 – 2015-10-09 (×13): 4 mg via INTRAVENOUS
  Filled 2015-09-30 (×13): qty 2

## 2015-09-30 MED ORDER — ACETAMINOPHEN 10 MG/ML IV SOLN
1000.0000 mg | Freq: Four times a day (QID) | INTRAVENOUS | Status: AC
  Administered 2015-09-30 – 2015-10-01 (×4): 1000 mg via INTRAVENOUS
  Filled 2015-09-30 (×3): qty 100

## 2015-09-30 MED ORDER — ONDANSETRON HCL 4 MG/2ML IJ SOLN
INTRAMUSCULAR | Status: AC
  Filled 2015-09-30: qty 2

## 2015-09-30 MED ORDER — EPHEDRINE SULFATE 50 MG/ML IJ SOLN
INTRAMUSCULAR | Status: AC
  Filled 2015-09-30: qty 1

## 2015-09-30 MED ORDER — THIAMINE HCL 100 MG/ML IJ SOLN: 1000.0000 mL | Freq: Once | INTRAVENOUS | Status: DC

## 2015-09-30 MED ORDER — GLYCOPYRROLATE 0.2 MG/ML IJ SOLN
INTRAMUSCULAR | Status: AC
  Filled 2015-09-30: qty 1

## 2015-09-30 MED ORDER — HYDROMORPHONE HCL 1 MG/ML IJ SOLN
0.2500 mg | INTRAMUSCULAR | Status: DC | PRN
  Administered 2015-09-30 (×3): 0.5 mg via INTRAVENOUS

## 2015-09-30 MED ORDER — PROMETHAZINE HCL 25 MG/ML IJ SOLN
12.5000 mg | Freq: Four times a day (QID) | INTRAMUSCULAR | Status: DC | PRN
  Administered 2015-10-03 – 2015-10-05 (×2): 12.5 mg via INTRAVENOUS
  Filled 2015-09-30 (×3): qty 1

## 2015-09-30 MED ORDER — CIPROFLOXACIN IN D5W 400 MG/200ML IV SOLN
400.0000 mg | Freq: Once | INTRAVENOUS | Status: AC
  Administered 2015-10-01: 400 mg via INTRAVENOUS
  Filled 2015-09-30: qty 200

## 2015-09-30 MED ORDER — LACTATED RINGERS IR SOLN
Status: DC | PRN
  Administered 2015-09-30: 1000 mL

## 2015-09-30 MED ORDER — ONDANSETRON HCL 4 MG/2ML IJ SOLN
INTRAMUSCULAR | Status: DC | PRN
  Administered 2015-09-30: 4 mg via INTRAVENOUS

## 2015-09-30 MED ORDER — LACTATED RINGERS IV SOLN
INTRAVENOUS | Status: DC
  Administered 2015-09-30: 1000 mL via INTRAVENOUS
  Administered 2015-09-30: 15:00:00 via INTRAVENOUS

## 2015-09-30 MED ORDER — FAMOTIDINE IN NACL 20-0.9 MG/50ML-% IV SOLN
20.0000 mg | Freq: Two times a day (BID) | INTRAVENOUS | Status: DC
  Administered 2015-09-30 – 2015-10-09 (×18): 20 mg via INTRAVENOUS
  Filled 2015-09-30 (×20): qty 50

## 2015-09-30 MED ORDER — 0.9 % SODIUM CHLORIDE (POUR BTL) OPTIME
TOPICAL | Status: DC | PRN
  Administered 2015-09-30: 1000 mL

## 2015-09-30 MED ORDER — FENTANYL CITRATE (PF) 100 MCG/2ML IJ SOLN
INTRAMUSCULAR | Status: DC | PRN
  Administered 2015-09-30: 50 ug via INTRAVENOUS
  Administered 2015-09-30: 100 ug via INTRAVENOUS
  Administered 2015-09-30 (×2): 50 ug via INTRAVENOUS

## 2015-09-30 MED ORDER — HYDROMORPHONE HCL 1 MG/ML IJ SOLN
INTRAMUSCULAR | Status: AC
  Administered 2015-09-30: 0.5 mg via INTRAVENOUS
  Filled 2015-09-30: qty 1

## 2015-09-30 MED ORDER — PROMETHAZINE HCL 25 MG/ML IJ SOLN
6.2500 mg | INTRAMUSCULAR | Status: DC | PRN
  Administered 2015-09-30: 12.5 mg via INTRAVENOUS

## 2015-09-30 MED ORDER — SODIUM CHLORIDE 0.9 % IJ SOLN
INTRAMUSCULAR | Status: DC | PRN
  Administered 2015-09-30: 50 mL

## 2015-09-30 MED ORDER — TISSEEL VH 10 ML EX KIT
PACK | CUTANEOUS | Status: AC
  Filled 2015-09-30: qty 1

## 2015-09-30 MED ORDER — SUMATRIPTAN 20 MG/ACT NA SOLN
20.0000 mg | NASAL | Status: DC | PRN
  Filled 2015-09-30: qty 1

## 2015-09-30 MED ORDER — PROPRANOLOL HCL 10 MG PO TABS
10.0000 mg | ORAL_TABLET | Freq: Two times a day (BID) | ORAL | Status: DC
  Administered 2015-10-04 – 2015-10-09 (×10): 10 mg via ORAL
  Filled 2015-09-30 (×17): qty 1

## 2015-09-30 MED ORDER — BUPIVACAINE LIPOSOME 1.3 % IJ SUSP
20.0000 mL | Freq: Once | INTRAMUSCULAR | Status: AC
  Administered 2015-09-30: 20 mL
  Filled 2015-09-30: qty 20

## 2015-09-30 MED ORDER — MIDAZOLAM HCL 5 MG/5ML IJ SOLN
INTRAMUSCULAR | Status: DC | PRN
  Administered 2015-09-30: 2 mg via INTRAVENOUS

## 2015-09-30 MED ORDER — HEPARIN SODIUM (PORCINE) 5000 UNIT/ML IJ SOLN
5000.0000 [IU] | Freq: Once | INTRAMUSCULAR | Status: AC
  Administered 2015-09-30: 5000 [IU] via SUBCUTANEOUS
  Filled 2015-09-30: qty 1

## 2015-09-30 MED ORDER — EPHEDRINE SULFATE 50 MG/ML IJ SOLN
INTRAMUSCULAR | Status: DC | PRN
  Administered 2015-09-30: 5 mg via INTRAVENOUS
  Administered 2015-09-30: 10 mg via INTRAVENOUS

## 2015-09-30 MED ORDER — OXYCODONE HCL 5 MG/5ML PO SOLN
5.0000 mg | ORAL | Status: DC | PRN
  Administered 2015-10-01: 5 mg via ORAL
  Administered 2015-10-02: 10 mg via ORAL
  Administered 2015-10-02 (×3): 5 mg via ORAL
  Administered 2015-10-03 – 2015-10-04 (×4): 10 mg via ORAL
  Administered 2015-10-05: 5 mg via ORAL
  Administered 2015-10-05 – 2015-10-06 (×2): 10 mg via ORAL
  Administered 2015-10-07: 5 mg via ORAL
  Administered 2015-10-07: 10 mg via ORAL
  Administered 2015-10-08 – 2015-10-09 (×2): 5 mg via ORAL
  Filled 2015-09-30: qty 10
  Filled 2015-09-30 (×3): qty 5
  Filled 2015-09-30 (×2): qty 10
  Filled 2015-09-30 (×2): qty 5
  Filled 2015-09-30 (×2): qty 10
  Filled 2015-09-30 (×2): qty 5
  Filled 2015-09-30 (×6): qty 10

## 2015-09-30 MED ORDER — SUGAMMADEX SODIUM 500 MG/5ML IV SOLN
INTRAVENOUS | Status: DC | PRN
  Administered 2015-09-30: 250 mg via INTRAVENOUS

## 2015-09-30 SURGICAL SUPPLY — 75 items
APPLICATOR COTTON TIP 6IN STRL (MISCELLANEOUS) ×4 IMPLANT
BIOPATCH WHT 1IN DISK W/4.0 H (GAUZE/BANDAGES/DRESSINGS) ×2 IMPLANT
BLADE SURG SZ11 CARB STEEL (BLADE) ×2 IMPLANT
CABLE HIGH FREQUENCY MONO STRZ (ELECTRODE) IMPLANT
CATH GASTROSTOMY 24FR (CATHETERS) ×2 IMPLANT
CHLORAPREP W/TINT 26ML (MISCELLANEOUS) ×4 IMPLANT
CLIP SUT LAPRA TY ABSORB (SUTURE) ×4 IMPLANT
COVER SURGICAL LIGHT HANDLE (MISCELLANEOUS) ×2 IMPLANT
CUTTER FLEX LINEAR 45M (STAPLE) IMPLANT
DERMABOND ADVANCED (GAUZE/BANDAGES/DRESSINGS) ×1
DERMABOND ADVANCED .7 DNX12 (GAUZE/BANDAGES/DRESSINGS) ×1 IMPLANT
DEVICE SUT QUICK LOAD TK 5 (STAPLE) IMPLANT
DEVICE SUT TI-KNOT TK 5X26 (MISCELLANEOUS) IMPLANT
DEVICE SUTURE ENDOST 10MM (ENDOMECHANICALS) IMPLANT
DEVICE TROCAR PUNCTURE CLOSURE (ENDOMECHANICALS) ×2 IMPLANT
DRAIN PENROSE 18X1/4 LTX STRL (WOUND CARE) IMPLANT
ELECT REM PT RETURN 9FT ADLT (ELECTROSURGICAL) ×2
ELECTRODE REM PT RTRN 9FT ADLT (ELECTROSURGICAL) ×1 IMPLANT
GAUZE SPONGE 4X4 12PLY STRL (GAUZE/BANDAGES/DRESSINGS) IMPLANT
GAUZE SPONGE 4X4 16PLY XRAY LF (GAUZE/BANDAGES/DRESSINGS) ×2 IMPLANT
GLOVE BIOGEL M STRL SZ7.5 (GLOVE) IMPLANT
GOWN STRL REUS W/TWL XL LVL3 (GOWN DISPOSABLE) ×8 IMPLANT
HOVERMATT SINGLE USE (MISCELLANEOUS) IMPLANT
IRRIG SUCT STRYKERFLOW 2 WTIP (MISCELLANEOUS) ×2
IRRIGATION SUCT STRKRFLW 2 WTP (MISCELLANEOUS) ×1 IMPLANT
KIT BASIN OR (CUSTOM PROCEDURE TRAY) ×2 IMPLANT
KIT GASTRIC LAVAGE 34FR ADT (SET/KITS/TRAYS/PACK) ×2 IMPLANT
LUBRICANT JELLY K Y 4OZ (MISCELLANEOUS) ×2 IMPLANT
MARKER SKIN DUAL TIP RULER LAB (MISCELLANEOUS) ×2 IMPLANT
NEEDLE SPNL 22GX3.5 QUINCKE BK (NEEDLE) ×2 IMPLANT
NEEDLE SPNL 22GX7 QUINCKE BK (NEEDLE) ×2 IMPLANT
PACK CARDIOVASCULAR III (CUSTOM PROCEDURE TRAY) ×2 IMPLANT
POUCH SPECIMEN RETRIEVAL 10MM (ENDOMECHANICALS) ×2 IMPLANT
RELOAD 45 VASCULAR/THIN (ENDOMECHANICALS) IMPLANT
RELOAD ENDO STITCH 2.0 (ENDOMECHANICALS) ×8
RELOAD STAPLE TA45 3.5 REG BLU (ENDOMECHANICALS) IMPLANT
RELOAD STAPLER BLUE 60MM (STAPLE) ×2 IMPLANT
RELOAD STAPLER GOLD 60MM (STAPLE) ×1 IMPLANT
RELOAD STAPLER WHITE 60MM (STAPLE) IMPLANT
SCISSORS LAP 5X45 EPIX DISP (ENDOMECHANICALS) ×2 IMPLANT
SEALANT SURGICAL APPL DUAL CAN (MISCELLANEOUS) ×4 IMPLANT
SHEARS HARMONIC ACE PLUS 45CM (MISCELLANEOUS) ×2 IMPLANT
SLEEVE ADV FIXATION 12X100MM (TROCAR) ×4 IMPLANT
SLEEVE ADV FIXATION 5X100MM (TROCAR) ×2 IMPLANT
SLEEVE XCEL OPT CAN 5 100 (ENDOMECHANICALS) IMPLANT
SOLUTION ANTI FOG 6CC (MISCELLANEOUS) ×2 IMPLANT
STAPLER ECHELON BIOABSB 60 FLE (MISCELLANEOUS) IMPLANT
STAPLER ECHELON LONG 60 440 (INSTRUMENTS) ×2 IMPLANT
STAPLER RELOAD BLUE 60MM (STAPLE) ×4
STAPLER RELOAD GOLD 60MM (STAPLE) ×2
STAPLER RELOAD WHITE 60MM (STAPLE)
SUT ETHILON 2 0 PS N (SUTURE) ×4 IMPLANT
SUT MNCRL AB 4-0 PS2 18 (SUTURE) ×2 IMPLANT
SUT RELOAD ENDO STITCH 2 48X1 (ENDOMECHANICALS) ×4
SUT RELOAD ENDO STITCH 2.0 (ENDOMECHANICALS) ×4
SUT SILK 0 SH 30 (SUTURE) ×2 IMPLANT
SUT SILK 2 0 SH (SUTURE) ×4 IMPLANT
SUT SURGIDAC NAB ES-9 0 48 120 (SUTURE) IMPLANT
SUT VIC AB 2-0 SH 27 (SUTURE) ×1
SUT VIC AB 2-0 SH 27X BRD (SUTURE) ×1 IMPLANT
SUTURE RELOAD END STTCH 2 48X1 (ENDOMECHANICALS) ×4 IMPLANT
SUTURE RELOAD ENDO STITCH 2.0 (ENDOMECHANICALS) ×4 IMPLANT
SYR 10ML ECCENTRIC (SYRINGE) ×2 IMPLANT
SYR 20CC LL (SYRINGE) ×4 IMPLANT
TOWEL OR 17X26 10 PK STRL BLUE (TOWEL DISPOSABLE) ×2 IMPLANT
TOWEL OR NON WOVEN STRL DISP B (DISPOSABLE) ×2 IMPLANT
TRAY FOLEY W/METER SILVER 14FR (SET/KITS/TRAYS/PACK) ×2 IMPLANT
TRAY FOLEY W/METER SILVER 16FR (SET/KITS/TRAYS/PACK) ×2 IMPLANT
TROCAR ADV FIXATION 12X100MM (TROCAR) ×4 IMPLANT
TROCAR ADV FIXATION 5X100MM (TROCAR) ×2 IMPLANT
TROCAR BLADELESS OPT 5 100 (ENDOMECHANICALS) ×2 IMPLANT
TROCAR XCEL 12X100 BLDLESS (ENDOMECHANICALS) ×2 IMPLANT
TUBING CONNECTING 10 (TUBING) IMPLANT
TUBING ENDO SMARTCAP PENTAX (MISCELLANEOUS) ×2 IMPLANT
TUBING INSUF HEATED (TUBING) ×2 IMPLANT

## 2015-09-30 NOTE — Transfer of Care (Signed)
Immediate Anesthesia Transfer of Care Note  Patient: Margaret Fletcher  Procedure(s) Performed: Procedure(s): LAPAROSCOPY DIAGNOSTIC (N/A) LAPAROSCOPIC GASTROSTOMY TUBE PLACEMENT (N/A) LAPAROSCOPIC REVISION OF ROUX LIMB (N/A)  Patient Location: PACU  Anesthesia Type:General  Level of Consciousness: awake and patient cooperative  Airway & Oxygen Therapy: Patient Spontanous Breathing and Patient connected to face mask oxygen  Post-op Assessment: Report given to RN and Post -op Vital signs reviewed and stable  Post vital signs: Reviewed and stable  Last Vitals:  Vitals:   09/30/15 1101  BP: 117/80  Pulse: 75  Resp: 16  Temp: 36.8 C    Last Pain:  Vitals:   09/30/15 1308  TempSrc:   PainSc: 0-No pain      Patients Stated Pain Goal: 3 ([id number])  Complications: No apparent anesthesia complications

## 2015-09-30 NOTE — Anesthesia Postprocedure Evaluation (Signed)
Anesthesia Post Note  Patient: Margaret Fletcher  Procedure(s) Performed: Procedure(s) (LRB): LAPAROSCOPY DIAGNOSTIC (N/A) LAPAROSCOPIC GASTROSTOMY TUBE PLACEMENT (N/A) LAPAROSCOPIC REVISION OF ROUX LIMB (N/A)  Patient location during evaluation: PACU Anesthesia Type: General Level of consciousness: awake and alert and patient cooperative Pain management: pain level controlled Vital Signs Assessment: post-procedure vital signs reviewed and stable Respiratory status: spontaneous breathing and respiratory function stable Cardiovascular status: stable Anesthetic complications: no    Last Vitals:  Vitals:   09/30/15 1745 09/30/15 1854  BP: 124/79 120/81  Pulse: (!) 47 (!) 46  Resp: 12 14  Temp: 36.3 C 36.6 C    Last Pain:  Vitals:   09/30/15 1854  TempSrc:   PainSc: [place]

## 2015-09-30 NOTE — Brief Op Note (Signed)
09/30/2015  5:00 PM  PATIENT:  Margaret Fletcher  45 y.o. female  PRE-OPERATIVE DIAGNOSIS:  EPIGASTRIC PAIN, MALNUTRITION, H/O GASTRIC BYPASS  POST-OPERATIVE DIAGNOSIS:  EPIGASTRIC PAIN, MALNUTRITION, H/O GASTRIC BYPASS  PROCEDURE:  Procedure(s): LAPAROSCOPY DIAGNOSTIC (N/A) LAPAROSCOPIC GASTROSTOMY TUBE PLACEMENT (N/A) LAPAROSCOPIC REVISION OF ROUX LIMB (N/A)  SURGEON:  Surgeon(s) and Role:    * Greer Pickerel, MD - Primary    * Excell Seltzer, MD - Assisting  PHYSICIAN ASSISTANT:   ASSISTANTS: Dr Adonis Housekeeper   ANESTHESIA:   general and 70 cc exparel  EBL:  Total I/O In: 1500 [I.V.:1500] Out: 50 [Blood:50]  BLOOD ADMINISTERED:none  DRAINS: Gastrostomy Tube   LOCAL MEDICATIONS USED:  OTHER exparel  SPECIMEN:  Source of Specimen:  small bowel (candy cane section of roux limb)  DISPOSITION OF SPECIMEN:  PATHOLOGY  COUNTS:  YES  TOURNIQUET:  * No tourniquets in log *  DICTATION: .Other Dictation: Dictation Number [id number]  PLAN OF CARE: Admit to inpatient   PATIENT DISPOSITION:  PACU - hemodynamically stable.   Delay start of Pharmacological VTE agent (>24hrs) due to surgical blood loss or risk of bleeding: no  Leighton Ruff. Redmond Pulling, MD, FACS General, Bariatric, & Minimally Invasive Surgery [facility], [place]

## 2015-09-30 NOTE — Anesthesia Preprocedure Evaluation (Addendum)
Anesthesia Evaluation  Patient identified by MRN, date of birth, ID band Patient awake    Reviewed: Allergy & Precautions, H&P , NPO status , Patient's Chart, lab work & pertinent test results  Airway Mallampati: II  TM Distance: >3 FB Neck ROM: Full    Dental no notable dental hx. (+) Teeth Intact, Dental Advisory Given   Pulmonary neg pulmonary ROS,    Pulmonary exam normal breath sounds clear to auscultation       Cardiovascular negative cardio ROS   Rhythm:Regular Rate:Normal     Neuro/Psych  Headaches, Anxiety Depression    GI/Hepatic negative GI ROS, Neg liver ROS,   Endo/Other  negative endocrine ROSMorbid obesity  Renal/GU negative Renal ROS  negative genitourinary   Musculoskeletal   Abdominal   Peds  Hematology negative hematology ROS (+)   Anesthesia Other Findings   Reproductive/Obstetrics negative OB ROS                            Anesthesia Physical Anesthesia Plan  ASA: II  Anesthesia Plan: General   Post-op Pain Management:    Induction: Intravenous  Airway Management Planned: Oral ETT  Additional Equipment:   Intra-op Plan:   Post-operative Plan: Extubation in OR  Informed Consent: I have reviewed the patients History and Physical, chart, labs and discussed the procedure including the risks, benefits and alternatives for the proposed anesthesia with the patient or authorized representative who has indicated his/her understanding and acceptance.   Dental advisory given  Plan Discussed with: CRNA  Anesthesia Plan Comments:         Anesthesia Quick Evaluation

## 2015-09-30 NOTE — Progress Notes (Signed)
Patient heart rate is ranging from 46-48 bpm, notify on call Provider Rennis Chris and gave an order to hold Propralol  (Inderal) for tonight. Patient connected to a continuous pulse oxymeter to monitor  heart rate.

## 2015-09-30 NOTE — Anesthesia Procedure Notes (Addendum)
Procedure Name: Intubation Date/Time: 09/30/2015 2:18 PM Performed by: Gean Maidens Pre-anesthesia Checklist: Patient identified, Emergency Drugs available, Suction available, Patient being monitored and Timeout performed Patient Re-evaluated:Patient Re-evaluated prior to inductionOxygen Delivery Method: Circle system utilized Preoxygenation: Pre-oxygenation with 100% oxygen Intubation Type: IV induction Ventilation: Mask ventilation without difficulty Laryngoscope Size: Mac and 4 Grade View: Grade I Tube type: Oral Tube size: 7.0 mm Number of attempts: 1 Airway Equipment and Method: Stylet Placement Confirmation: ETT inserted through vocal cords under direct vision,  positive ETCO2,  CO2 detector and breath sounds checked- equal and bilateral Secured at: 22 cm Tube secured with: Tape Dental Injury: Teeth and Oropharynx as per pre-operative assessment

## 2015-09-30 NOTE — Interval H&P Note (Signed)
History and Physical Interval Note:  09/30/2015 1:28 PM  Margaret Fletcher  has presented today for surgery, with the diagnosis of EPIGASTRIC PAIN, MALNUTRITION, H/O GASTRIC BYPASS  The various methods of treatment have been discussed with the patient and family. After consideration of risks, benefits and other options for treatment, the patient has consented to  Procedure(s): LAPAROSCOPY DIAGNOSTIC (N/A) LAPAROSCOPIC GASTROSTOMY TUBE PLACEMENT (N/A) POSSIBLE LAPAROSCOPIC SMALL BOWEL RESECTION (N/A) as a surgical intervention .  The patient's history has been reviewed, patient examined, no change in status, stable for surgery.  I have reviewed the patient's chart and labs.  Questions were answered to the patient's satisfaction.    Leighton Ruff. Redmond Pulling, MD, [place], Bariatric, & Minimally Invasive Surgery [facility], [place]   [facility]

## 2015-09-30 NOTE — H&P (View-Only) (Signed)
Margaret Fletcher 09/25/2015 11:10 AM Location: [facility] Patient #: [id number] DOB: [birth year] Divorced / Language: Cleophus Fletcher / Race: Black or African American Female  History of Present Illness [name] MD; 09/25/2015 2:25 PM) The patient is a 45 year old female presenting status-post bariatric surgery. She comes in for ongoing follow-up after laparoscopic Roux-en-Y gastric bypass from May 1. She has had difficulty with oral intake due to persistent epigastric discomfort and feelings of fullness. She has had upper GI, CT scan, EGD to evaluate for problems and those have been normal. Most recently she was admitted to [facility] long from July 5 through the eighth with line sepsis from her PICC line. She grew out enterococcus. She is still on oral antibiotics. She denies any fevers or chills. She denies any vomiting. She is still having ongoing difficulty with oral intake. Some days are better than others. She will generally get in at least one shake per day as well as some liquid. She has an ongoing sensation of feeling full in her upper abdomen. She no longer has a raw sensation in her upper abdomen. She feels that certain liquids or foods weigh heavy on her stomach. Since she has had the PICC line removed and has been treated for bacteremia she states that she feels better. She reports less fatigue. She states some days her urine is concentrated. She states that she had 2 bites of egg salad and it felt like it just sat in her upper abdomen for a day. She is accompanied by family member.   08/29/15 Because she has had ongoing discomfort I arranged an upper endoscopy on June 9. Her upper endoscopy revealed perhaps some mild distal esophagitis. Her anastomosis is widely patent. There was no sign of marginal ulcer. She had 2 staples that were visible at the anastomosis however the surrounding mucosa was intact. She was admitted afterwards and kept over the weekend so we  could start working on TPN because her nutritional status had started to become compromised. She was sent home with continuous TPN on the 12th. There was an issue with lab draws where the labs were drawn incorrectly resulting in artificially elevated abnormalities. She states that she is still having difficulty with liquid intake. She will have good days and bad days. She will tolerate stuff for a few days and then have issues. It is the ongoing sensation of a full sensation in her upper abdomen. It feels like it goes down hard. She describes it as a heavy sensation. She denies any regurgitation, vomiting. She has a little bit more discomfort with solids than liquids. She is taking Protonix, Carafate, nausea medicine as needed. She is also occasionally pre-medicating with oral intake with a small dose of oxycodone.  5/28 /17 She comes in for follow-up after undergoing laparoscopic Roux-en-Y gastric bypass. She had surgery on May 1. Her preoperative weight was 273 pounds. Her postop course was complicated by being readmitted on May 8 through the 12 for difficulty with oral intake. She described it as epigastric discomfort. A repeat upper GI demonstrated no evidence of leak. There was prompt passage of contrast through her pouch into her Roux limb. Her blood count was normal. She had no fever or tachycardia. There is no suspicion of the postoperative leak. A PICC line was placed and she was sent home with intermittent IV fluid administration 3 times a week. I last saw her last Friday on 5/19. Her weight was 253 lbs. She states that she is  doing about same. She is staying on liquids as instructed. Last week the epigastric discomfort was a 5 or 6 now it is a 3-4. She describes it as a upper tummy ache. She did actually have a flare last evening when she was chewing some grilled chicken for flavor and inadvertently swallowed some and it caused significant epigastric pain within 11min of  swallowing it. She denies any left upper quadrant all shoulder pain. She denies any fever or chills. She is having flatus and having bowel movements. She is taking her supplements without difficulty. She is alternating between pushing water and pushing protein shakes. She is getting 1 protein shake per day +water. does better with ice. thicker liquids are more challenging. she reports she had labs this past Monday but didn't bring copy of report. Some days are better than others. She denies any heartburn or indigestion. She is taking Protonix and carafate not religiously. doesn't think it helps.  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) ALOPECIA (L65.9) MILD OBSTRUCTIVE SLEEP APNEA (G47.33)  Other Problems [name], MD; 09/25/2015 2:29 PM) Hemorrhoids Anxiety Disorder Migraine Headache  Past Surgical History [name], MD; 09/25/2015 2:29 PM) Cesarean Section - Multiple Oral Surgery Appendectomy Gallbladder Surgery - Laparoscopic  Diagnostic Studies History [name], MD; 09/25/2015 2:29 PM) Pap Smear 1-5 years ago Colonoscopy never Mammogram within last year  Allergies [name], [name]; 09/25/2015 11:11 AM) Sulfa Antibiotics  Medication History [name], [name]; 09/25/2015 11:11 AM) Zofran ODT (4MG  Tablet Disperse, 1 (one) Oral every six hours, as needed, Taken starting  08/29/2015) Active. Vitamin D (Ergocalciferol) (50000UNIT Capsule, 1 (one) Capsule Oral once a week, Taken starting 07/03/2015) Active. Protonix (40MG  Tablet DR, 1 (one) Oral daily, Taken starting 06/26/2015) Active. Biotin Active. Wellbutrin (75MG  Tablet, Oral) Active. Calcium + D (150-261-330MG -MG-IU Capsule, Oral) Active. Vitamin B12 (100MCG Tablet, Oral) Active. Magnesium Carbonate (250MG  Capsule, Oral) Active. Multiple Vitamin (Oral) Active. Vitamin B6 (200MG  Tablet, Oral) Active. Imitrex (25MG  Tablet, Oral) Active. Topiramate (100MG  Tablet, Oral) Active. Medications Reconciled  Social History [name], MD; 09/25/2015 2:29 PM) Alcohol use Occasional alcohol use. No drug use Tobacco use Never smoker. Caffeine use Carbonated beverages, Coffee, Tea.  Family History [name], MD; 09/25/2015 2:29 PM) Seizure disorder Son. Cerebrovascular Accident Father. Diabetes Mellitus Brother. Heart disease in female family member before age 11 Heart disease in female family member before age 21 Colon Polyps Father. Breast Cancer Mother.  Pregnancy / Birth History [name], MD; 09/25/2015 2:29 PM) Age at menarche 15 years. Para 2 Maternal age 34-25 Gravida 3 Irregular periods    Vitals [name] [name]; 09/25/2015 11:11 AM) 09/25/2015 11:11 AM Weight: 236 lb Height: 67.5in Body Surface Area: 2.18 m Body Mass Index: 36.42 kg/m  Temp.: 97.9F(Temporal)  Pulse: 88 (Regular)  BP: 128/72 (Sitting, Left Arm, Standard)      Physical Exam [name] M. Niara Bunker MD; 09/25/2015 2:26 PM)  General Mental Status-Alert. General Appearance-Consistent with stated age. Hydration-Well hydrated. Voice-Normal.  Chest and Lung Exam Chest and lung exam reveals -quiet, even and  easy respiratory effort with no use of accessory muscles. Inspection Chest Wall - Normal. Back - normal. Auscultation Breath sounds -  Normal.  Cardiovascular Auscultation Rhythm - Regular.  Abdomen Inspection Skin - Scar - Note: Incision: clean, dry, and intact. Palpation/Percussion Palpation and Percussion of the abdomen reveal - Soft, Non Tender, No Rebound tenderness and No Rigidity (guarding). Auscultation Auscultation of the abdomen reveals - Bowel sounds normal.  Neuropsychiatric Orientation-oriented X3. The patient's mood and affect are described as -not angry, depressed, not anxious, not agitated. Judgment and Insight-insight is appropriate concerning matters relevant to self, the patient displays appropriate judgment regarding every day activities and judgment is appropriate in social situations.  Musculoskeletal Normal Exam - Left-Upper Extremity Strength Normal and Lower Extremity Strength Normal. Normal Exam - Right-Upper Extremity Strength Normal and Lower Extremity Strength Normal.    Assessment & Plan [name] M. Kamaile Zachow MD; 09/25/2015 2:29 PM)  GASTRIC BYPASS STATUS FOR OBESITY (Z98.84) Impression: She looks well. She does not look dehydrated. However I do think she could benefit from IV fluid hydration. We will arrange for her to go to the sickle cell hydration clinic tomorrow after her preoperative appointment. It is unclear as to the etiology of her continued ongoing epigastric sensation of fullness and heaviness. There've been no significant abnormalities found on upper GI, CT, or upper endoscopy. Because of her ongoing inability to get adequate nutrition by mouth and the fact that she has had bacteremia from line sepsis I feel the only way at this point to get her adequate nutrition is to place a gastrostomy tube in her excluded stomach. I also recommend a diagnostic laparoscopy to otherwise complete her workup to figure out a cause for her ongoing epigastric discomfort. We did talk about the possibility of laparoscopic resection of the blind end of her roux limb. We discussed the risk and  benefits of surgery including but not limited to bleeding, infection, injury to surrounding structures, blood clot formation, gastrostomy tube complication such as leak, skin infection, the G-tube coming out, perioperative cardiac and pulmonary events, as well as failure to ameliorate her abdominal discomfort. She is currently scheduled for surgery on Monday. She was instructed to contact the office should she have any questions between now and surgery. I think this will also give her a little bit of a break from trying to push liquids by mouth and help with some of her depression.  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 Redmond Pulling, MD, FACS General, Bariatric, & Minimally Invasive Surgery [facility], [place]

## 2015-10-01 LAB — COMPREHENSIVE METABOLIC PANEL
ALT: 23 U/L (ref 14–54)
ANION GAP: 6 (ref 5–15)
AST: 25 U/L (ref 15–41)
Albumin: 3.1 g/dL — ABNORMAL LOW (ref 3.5–5.0)
Alkaline Phosphatase: 52 U/L (ref 38–126)
BUN: 12 mg/dL (ref 6–20)
CHLORIDE: 107 mmol/L (ref 101–111)
CO2: 22 mmol/L (ref 22–32)
CREATININE: 1.13 mg/dL — AB (ref 0.44–1.00)
Calcium: 8.6 mg/dL — ABNORMAL LOW (ref 8.9–10.3)
GFR, EST NON AFRICAN AMERICAN: 58 mL/min — AB (ref >60)
Glucose, Bld: 110 mg/dL — ABNORMAL HIGH (ref 65–99)
POTASSIUM: 4.9 mmol/L (ref 3.5–5.1)
SODIUM: 135 mmol/L (ref 135–145)
Total Bilirubin: 0.5 mg/dL (ref 0.3–1.2)
Total Protein: 6.5 g/dL (ref 6.5–8.1)

## 2015-10-01 LAB — CBC WITH DIFFERENTIAL/PLATELET
Basophils Absolute: 0 10*3/uL (ref 0.0–0.1)
Basophils Relative: 0 %
EOS ABS: 0 10*3/uL (ref 0.0–0.7)
EOS PCT: 0 %
HCT: 36.5 % (ref 36.0–46.0)
Hemoglobin: 11.9 g/dL — ABNORMAL LOW (ref 12.0–15.0)
LYMPHS ABS: 1 10*3/uL (ref 0.7–4.0)
LYMPHS PCT: 11 %
MCH: 30.4 pg (ref 26.0–34.0)
MCHC: 32.6 g/dL (ref 30.0–36.0)
MCV: 93.1 fL (ref 78.0–100.0)
MONO ABS: 0.4 10*3/uL (ref 0.1–1.0)
MONOS PCT: 4 %
Neutro Abs: 7.2 10*3/uL (ref 1.7–7.7)
Neutrophils Relative %: 85 %
PLATELETS: 264 10*3/uL (ref 150–400)
RBC: 3.92 MIL/uL (ref 3.87–5.11)
RDW: 14.1 % (ref 11.5–15.5)
WBC: 8.5 10*3/uL (ref 4.0–10.5)

## 2015-10-01 MED ORDER — ALUM & MAG HYDROXIDE-SIMETH 200-200-20 MG/5ML PO SUSP
15.0000 mL | Freq: Four times a day (QID) | ORAL | Status: DC | PRN
  Administered 2015-10-01: 15 mL via ORAL
  Filled 2015-10-01: qty 30

## 2015-10-01 NOTE — Consult Note (Signed)
   [facility] CM Inpatient Consult   10/01/2015  CARNETTA DEVEAU 09/14/1970 [id number]   Came to visit Ms.[name] at bedside on behalf of Norm Parcel to [facility] Management program for [facility] employees/dependents with [facility] insurance. Ms.Hayes is familiar with the Link to Wellness program. She was recently contacted post hospital discharge after last hospital admission by [place]. She is agreeable to [place] to Wellness follow up after this hospital discharge as well. [name] reports this procedure was scheduled after her recent MD visit. Will continue to follow along.   Marthenia Rolling, MSN-Ed, RN,BSN [facility] Liaison [phone]

## 2015-10-01 NOTE — Progress Notes (Signed)
1 Day Post-Op  Subjective: "Rough night" with pain and nausea. But better today. Walked. Not much nausea.  Objective: Vital signs in last 24 hours: Temp:  [97.4 F (36.3 C)-98.3 F (36.8 C)] 98.1 F (36.7 C) (07/25 0535) Pulse Rate:  [46-76] 55 (07/25 0535) Resp:  [12-22] 14 (07/25 0535) BP: (106-132)/(65-91) 109/65 (07/25 0535) SpO2:  [100 %] 100 % (07/25 0535) Weight:  [106.6 kg (235 lb)] 106.6 kg (235 lb) (07/24 1117) Last BM Date: 09/25/15  Intake/Output from previous day: 07/24 0701 - 07/25 0700 In: 2725 [I.V.:2725] Out: 1150 [Urine:1000; Drains:100; Blood:50] Intake/Output this shift: No intake/output data recorded.  Alert, nad cta Reg Soft, mild expected TTP. g tube intact.  No edema, scds  Lab Results:   Recent Labs  10/01/15 0424  WBC 8.5  HGB 11.9*  HCT 36.5  PLT 264   BMET  Recent Labs  10/01/15 0424  NA 135  K 4.9  CL 107  CO2 22  GLUCOSE 110*  BUN 12  CREATININE 1.13*  CALCIUM 8.6*   PT/INR No results for input(s): LABPROT, INR in the last 72 hours. ABG No results for input(s): PHART, HCO3 in the last 72 hours.  Invalid input(s): PCO2, PO2  Studies/Results: No results found.  Anti-infectives: Anti-infectives    Start     Dose/Rate Route Frequency Ordered Stop   10/01/15 0100  ciprofloxacin (CIPRO) IVPB 400 mg     400 mg 200 mL/hr over 60 Minutes Intravenous  Once 09/30/15 1803 10/01/15 0128   09/30/15 2000  fluconazole (DIFLUCAN) IVPB 150 mg     150 mg 75 mL/hr over 60 Minutes Intravenous Every 24 hours 09/30/15 1803 09/30/15 2112   09/30/15 1057  ciprofloxacin (CIPRO) IVPB 400 mg     400 mg 200 mL/hr over 60 Minutes Intravenous On call to O.R. 09/30/15 1057 09/30/15 1425      Assessment/Plan: Epigastric pain, mild PCMN, h/o gastric bypass s/p Procedure(s): LAPAROSCOPY DIAGNOSTIC (N/A) LAPAROSCOPIC GASTROSTOMY TUBE PLACEMENT (N/A) LAPAROSCOPIC REVISION OF ROUX LIMB (N/A)  Doing well.  No tachy. No fever Start POD 1  diet for comfort Cont chemical vte prophylaxis Nutritional consult for G tube - would like to do 75%-80% nutritional needs via G tube. Ultimately work toward cyclic TF.  Ambulate  Leighton Ruff. Redmond Pulling, MD, FACS General, Bariatric, & Minimally Invasive Surgery [facility], [place]   LOS: 1 day    [name] 10/01/2015

## 2015-10-01 NOTE — Op Note (Signed)
Margaret Fletcher, Margaret Fletcher              ACCOUNT NO.:  [id number]  MEDICAL RECORD NO.:  [id number]  LOCATION:  [id number]                         FACILITY:  [facility]  PHYSICIAN:  Leighton Ruff. Redmond Pulling, MD, [place] OF BIRTH:  Feb 22, 1971  DATE OF PROCEDURE:  09/30/2015 DATE OF DISCHARGE:                              OPERATIVE REPORT   PREOPERATIVE DIAGNOSES:  Epigastric pain, history of Roux-en-Y gastric bypass on Jul 08, 2015, mild protein-calorie malnutrition.  POSTOPERATIVE DIAGNOSES:  Epigastric pain, history of Roux-en-Y gastric bypass on Jul 08, 2015, mild protein-calorie malnutrition.  PROCEDURES:  Diagnostic laparoscopy, laparoscopic placement of 24-French gastrostomy tube in excluded stomach; laparoscopic revision of the Roux limb.  SURGEON:  Leighton Ruff. Redmond Pulling, MD, FACS.  ASSISTANT:  Marland Kitchen T. Hoxworth, M.D.  Pleas Patricia:  Minimal.  INDICATIONS FOR PROCEDURE:  The patient underwent an uneventful anti- colic, anti-gastric Roux-en-Y gastric bypass on Jul 08, 2015.  Since Surgery, she has had ongoing epigastric discomfort.  She describes it as a constant full sensation.  She describes it as a raw sensation.  She has had several readmissions since surgery for dehydration as well as intolerance to oral food.  She has not been able to maintain adequate hydration and nutrition by mouth and a PICC line had been placed about a month ago and she was started on TPN.  Unfortunately, the patient developed bacteremia and the PICC line had to be removed.  She was only getting in about one protein shake per day on a good day as well as some water by mouth.  She has had upper GI, CT scan as well as upper endoscopy, all of which were unremarkable and with no clear etiology for her ongoing epigastric discomfort with eating.  Because of her ongoing pain, I recommended diagnostic laparoscopy and placement of a feeding tube in her excluded stomach for nutrition as well as to take some of the pressure off her to try to eat  by mouth because it was causing some psychosocial issues with her.  We discussed possibly resecting a portion of the Roux limb, specifically the blind candy-cane in if it was a long blind segment.  We discussed at length the risks and benefits including, but not limited to bleeding, infection, injury to surrounding structures and failure to ameliorate her pain, blood clot formation, wound infection, G-tube complication such as dislodgement and infection as well as the need for additional procedures.  Please see my clinic notes for additional details.  DESCRIPTION OF PROCEDURE:  After obtaining informed consent, the patient was taken to the OR 1 at [facility] and placed supine on the operating room table.  General endotracheal anesthesia was established. Sequential compression devices were placed.  Her abdomen was prepped and draped in usual standard surgical fashion with ChloraPrep.  She received ciprofloxacin prior to skin incision.  A surgical time-out was performed.  Small incision was made in the left upper quadrant through her old trocar site, then using a 5-mm 0-degree laparoscope through a 5- mm trocar, advanced it through all layers of the abdominal cavity and carefully entered the abdomen under direct visualization. Pneumoperitoneum was smoothly established up to a patient pressure of 15 mmHg with no change  in the patient's vital signs.  A 5-mm trocar was placed in the old and all trocar sites just to the left of the umbilicus under direct visualization.  Then, two 5s were placed in the right abdomen through previously old scars as well.  At this point, I inspected the Optiview entry site.  There was no evidence of injury to surrounding structures.  I identified the Roux limb and traced it down to the jejunojejunostomy.  There was an omentum adhered to it.  I then identified the common channel and ran it distally all the way to the terminal ilium.  The common channel  distally looked normal.  There was no sign of overtly distended common channel.  The Roux limb appeared normal.  It was slightly hyperemic, but it was not dilated.  It was not ischemic in appearance.  Dr. Excell Seltzer ended up placing another 5-mm trocar in the left upper quadrant.  I took off the omental adhesions off the jejunojejunostomy.  We visualized the biliary pancreatic limb. There was no sign of stricture at the jejunojejunostomy.  There was no sign of internal hernia.  At this point, a Nathanson liver retractor was placed through the subxiphoid trocar site, some adhesions were taken down from the liver to the gastric pouch, I think more on the liver side.  We visualized the Roux limb going all the way to the gastric jejunostomy.  She did have a 10-cm long candy-cane, it was not distended.  However, it was rather lengthy, I measured with a ruler.  I decided to resect the redundant proximal Roux limb in the blind candy- cane urea.  The mesentery was taken down in a serial fashion with Harmonic scalpel staying very close to the bowel.  I mobilized it for about 8 cm.  I upsized the mid right abdomen trocar to a 12-mm trocar. Then, using an echelon stapler, transected the majority of the candy- cane with a 6-cm blue load staple.  The small bowel was placed in the EndoCatch bag and left within the abdomen.  We then visualized the excluded stomach.  There was not a lot of mobility, but we were able to grab section of the distal stomach in the proximal antrum and bring it up to the abdominal wall along the greater curvature.  Using a 0 silk suture x2, I placed two sutures through the anterior wall of the stomach along the greater curvature.  Then using an Endo Close, we grasped each of those sutures and brought it out through the abdominal wall after making the skin incision around Dr. Lear Ng left upper quadrant trocar enlarged as stay sutures.  A gastrotomy was then performed with the  Harmonic scalpel.  We ensured that we were in the gastric lumen.  I then obtained a 24-French MIC gastrostomy tube.  We took out the left upper quadrant trocar and advanced the G tube through the skin into the abdomen and threaded it into the excluded stomach.  The balloon was inflated with 10 mL of air.  We then advanced the G-tube more proximally in the stomach to give [name] some space depending on a pursestring suture. I then placed a pursestring suture around the gastrotomy site and around the G-tube with a 2-0 silk suture laparoscopically.  The suture was then tied down.  I then placed an additional 0 silk suture along the inferior margin of the greater curve as a third Stamm gastrostomy site, it was brought out in a similar fashion with an Endo  Close device.  Some of the pneumoperitoneum was released and the stay sutures were then tied down to Stamm the gastrotomy to the anterior abdominal wall.  It reached well.  I then ended up placing a fourth suture along the anterior medial aspect of the gastric tube where it went into the excluded stomach.  It was then brought out through the fascia in a similar fashion and tied down at the transfascial Stamm gastrostomy tube suture like the other three sutures.  First, I then made the gastrotomy tube a little more snug and the balloon was still inflated and the stomach easily reached the abdominal wall in this location.  It was underneath her ribcage on the left side, but we could not get the stomach any lower, but that was the best location of excluded stomach to place the G-tube.  The liver retractor was removed.  Tisseel tissue sealant was placed along the staple line where we resected the Roux limb.  Exparel was infiltrated in all the trocar sites and around the G-tube site.  Pneumoperitoneum was released.  The incision around the G-tube site had been enlarged in order to place the transfascial Stamm gastrotomy tube sutures. Reapproximated  the skin with a single interrupted 2-0 nylon suture.  The gastrotomy tube was then secured to the skin with two interrupted 2-0 nylon sutures.  The remaining trocar sites were closed with the 4-0 Monocryl in a subcuticular fashion followed by application of Dermabond.  All needle, instrument and sponge counts were correct x2.  There were no immediate complications.  The patient tolerated the procedure well.     Leighton Ruff. Redmond Pulling, MD, FACS     EMW/MEDQ  D:  09/30/2015  T:  10/01/2015  Job:  [id number]

## 2015-10-01 NOTE — Progress Notes (Signed)
Patient alert and oriented, Post op day 1.  Provided support and encouragement.  Encouraged pulmonary toilet, ambulation and small sips of liquids.  All questions answered.  Will continue to monitor. 

## 2015-10-01 NOTE — Progress Notes (Signed)
Pt transferred to 1523 via wheelchair. Report given to [place], [place].

## 2015-10-02 LAB — CBC WITH DIFFERENTIAL/PLATELET
BASOS ABS: 0.1 10*3/uL (ref 0.0–0.1)
BASOS PCT: 1 %
EOS ABS: 0.2 10*3/uL (ref 0.0–0.7)
Eosinophils Relative: 2 %
HCT: 32 % — ABNORMAL LOW (ref 36.0–46.0)
HEMOGLOBIN: 10.5 g/dL — AB (ref 12.0–15.0)
LYMPHS ABS: 3 10*3/uL (ref 0.7–4.0)
Lymphocytes Relative: 27 %
MCH: 30.9 pg (ref 26.0–34.0)
MCHC: 32.8 g/dL (ref 30.0–36.0)
MCV: 94.1 fL (ref 78.0–100.0)
Monocytes Absolute: 0.7 10*3/uL (ref 0.1–1.0)
Monocytes Relative: 6 %
NEUTROS PCT: 64 %
Neutro Abs: 6.9 10*3/uL (ref 1.7–7.7)
Platelets: 246 10*3/uL (ref 150–400)
RBC: 3.4 MIL/uL — AB (ref 3.87–5.11)
RDW: 14.4 % (ref 11.5–15.5)
WBC: 10.8 10*3/uL — AB (ref 4.0–10.5)

## 2015-10-02 MED ORDER — OSMOLITE 1.2 CAL PO LIQD
237.0000 mL | Freq: Three times a day (TID) | ORAL | Status: DC
  Administered 2015-10-02: 140 mL
  Filled 2015-10-02 (×4): qty 237

## 2015-10-02 MED ORDER — FREE WATER
50.0000 mL | Freq: Three times a day (TID) | Status: DC
  Administered 2015-10-02 (×2): 50 mL

## 2015-10-02 MED ORDER — ADULT MULTIVITAMIN LIQUID CH
15.0000 mL | Freq: Every day | ORAL | Status: DC
  Administered 2015-10-02 – 2015-10-09 (×7): 15 mL
  Filled 2015-10-02 (×8): qty 15

## 2015-10-02 NOTE — Progress Notes (Signed)
2 Days Post-Op  Subjective: Apparently had swelling above the G-tube late yesterday evening. G-tube was returned to gravity. Reports swelling is better. It is sore. She took some liquids by mouth. She ambulated. Mild nausea.  Objective: Vital signs in last 24 hours: Temp:  [98.5 F (36.9 C)-99.8 F (37.7 C)] 98.8 F (37.1 C) (07/26 0525) Pulse Rate:  [56-60] 56 (07/26 0525) Resp:  [16-18] 16 (07/26 0525) BP: (97-111)/(55-69) 110/58 (07/26 0525) SpO2:  [97 %-100 %] 100 % (07/26 0525) Last BM Date: 09/25/15  Intake/Output from previous day: 07/25 0701 - 07/26 0700 In: 550 [I.V.:500; IV Piggyback:50] Out: 2950 [Urine:2950] Intake/Output this shift: No intake/output data recorded.  Alert, nontoxic Clear to auscultation Regular Soft, nondistended. Incisions-clean, dry, intact. G-tube intact and secure. Mild tenderness above G-tube insertion site. No swelling, cellulitis, or induration. The G-tube is just below her rib cage. Positive SCDs  Lab Results:   Recent Labs  10/01/15 0424 10/02/15 0508  WBC 8.5 10.8*  HGB 11.9* 10.5*  HCT 36.5 32.0*  PLT 264 246   BMET  Recent Labs  10/01/15 0424  NA 135  K 4.9  CL 107  CO2 22  GLUCOSE 110*  BUN 12  CREATININE 1.13*  CALCIUM 8.6*   PT/INR No results for input(s): LABPROT, INR in the last 72 hours. ABG No results for input(s): PHART, HCO3 in the last 72 hours.  Invalid input(s): PCO2, PO2  Studies/Results: No results found.  Anti-infectives: Anti-infectives    Start     Dose/Rate Route Frequency Ordered Stop   10/01/15 0100  ciprofloxacin (CIPRO) IVPB 400 mg     400 mg 200 mL/hr over 60 Minutes Intravenous  Once 09/30/15 1803 10/01/15 0128   09/30/15 2000  fluconazole (DIFLUCAN) IVPB 150 mg     150 mg 75 mL/hr over 60 Minutes Intravenous Every 24 hours 09/30/15 1803 09/30/15 2112   09/30/15 1057  ciprofloxacin (CIPRO) IVPB 400 mg     400 mg 200 mL/hr over 60 Minutes Intravenous On call to O.R. 09/30/15 1057  09/30/15 1425      Assessment/Plan: Epigastric pain, mild PCMN, h/o gastric bypass  s/p Procedure(s): LAPAROSCOPY DIAGNOSTIC (N/A) LAPAROSCOPIC GASTROSTOMY TUBE PLACEMENT (N/A) LAPAROSCOPIC REVISION OF ROUX LIMB (N/A)   Not sure what the swelling was. Could have been a small hematoma. No evidence of hematoma or infection. There is gastric contents within the bag so G-tube is still living correct location.  G-tube teaching. Awaiting nutrition consult for enteral feeds. Will supplement oral intake with tube feeds. Continued chemical DVT prophylaxis Ambulate Start multivitamin  Leighton Ruff. Redmond Pulling, MD, FACS General, Bariatric, & Minimally Invasive Surgery [facility], [place]   LOS: 2 days    [name] 10/02/2015

## 2015-10-02 NOTE — Progress Notes (Signed)
Started G tube education with bolus feeding. A handout was also given to the patient for her to read over. Will have following shifts to continue to educate pt about G tube care.

## 2015-10-02 NOTE — Progress Notes (Signed)
Patient alert and oriented, Post op day 2.  Provided support and encouragement.  Encouraged pulmonary toilet, ambulation and small sips of liquids.  All questions answered.  Will continue to monitor. 

## 2015-10-02 NOTE — Progress Notes (Signed)
Pt unable to tolerate full 237 mls of Osmolite. Was only able to tolerate 120 mls of the full dose. Paged Will, Dietitian and am awaiting a call back. Will continue to monitor

## 2015-10-02 NOTE — Progress Notes (Addendum)
Initial Nutrition Assessment  DOCUMENTATION CODES:   Obesity unspecified, Severe malnutrition in context of acute illness/injury  INTERVENTION:  -Begin Osmolite 1.2 boluses via G-tube, 1 can - 3 times daily, 0900, 1400, 1900 -72mL free water after each feeding -Try to increase to goal of 5 cans prior to discharge. -5 cans provides 1425 calories, 66 grams of protein, and 1161mL free water -Encourage PO intake, water intake  -Premier Protein Shakes 2oz. Q2H  NUTRITION DIAGNOSIS:   Inadequate oral intake related to altered GI function, poor appetite, other (see comment) (throat pain, gastric pain) as evidenced by per patient/family report.  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 Pt no longer has picc line or TPN, g tube was placed. Pt is still unable to eat significant amounts, consumes unjury at home and some "body armor" ( carb drink).  She is now 2 days s/p revision of roux limb, diagnostic laparoscopy. Per MD note, RD consulted to provide tube feeds equal to 75% of nutritional needs. Pt's PO intake at home is very limited, but pt is concerned with feedings and wants to continue to lose weight.  Pt is supposed to lose weight as a result of bariatric surgery but nutrition status is in question. Pt has not had PICC for 19 days now, and she endorses eating only "1-2 bites" of food at home. She has consumed 1 unjury shake already today, endorses doing 1-2/day at home. She has also had a premier protein shake. She has not consistently consumed body armor at this point. Given Poor PO intake and wt loss pt is severely malnourished in context of acute illness. She currently exhibits a  32#/12% severe wt loss over 3 months. Nutrition-Focused physical exam completed. Findings are no fat depletion, no muscle depletion, and no edema.   Labs and medications reviewed: Multivitamin 41mL daily NaCl w/ KCL 22mEq @ 180mL/hr    Diet Order:  Diet NPO time specified Except for: Sips with Meds  Skin:  Reviewed, no issues  Last BM:  7/19  Height:   Ht Readings from Last 1 Encounters:  09/30/15 5\' 8"  (1.727 m)    Weight:   Wt Readings from Last 1 Encounters:  09/30/15 235 lb (106.6 kg)    Ideal Body Weight:  63.63 kg  BMI:  Body mass index is 35.73 kg/m.  Estimated Nutritional Needs:   Kcal:  1900-2100 calories  Protein:  90-100 grams  Fluid:  >/= 1.9L  EDUCATION NEEDS:   Education needs addressed  Satira Anis. Margaret Higley, MS, RD LDN Inpatient Clinical Dietitian Pager [phone]

## 2015-10-03 ENCOUNTER — Inpatient Hospital Stay (HOSPITAL_COMMUNITY): Payer: 59

## 2015-10-03 MED ORDER — CIPROFLOXACIN IN D5W 400 MG/200ML IV SOLN
400.0000 mg | INTRAVENOUS | Status: AC
  Administered 2015-10-04: 400 mg via INTRAVENOUS
  Filled 2015-10-03: qty 200

## 2015-10-03 MED ORDER — ENOXAPARIN SODIUM 30 MG/0.3ML ~~LOC~~ SOLN: 30.0000 mg | Freq: Two times a day (BID) | SUBCUTANEOUS | Status: DC

## 2015-10-03 MED ORDER — CIPROFLOXACIN IN D5W 400 MG/200ML IV SOLN
400.0000 mg | INTRAVENOUS | Status: DC
  Filled 2015-10-03: qty 200

## 2015-10-03 MED ORDER — METHOCARBAMOL 1000 MG/10ML IJ SOLN
500.0000 mg | Freq: Three times a day (TID) | INTRAVENOUS | Status: DC | PRN
  Administered 2015-10-03 (×2): 500 mg via INTRAVENOUS
  Filled 2015-10-03 (×2): qty 5
  Filled 2015-10-03: qty 550
  Filled 2015-10-03: qty 5

## 2015-10-03 NOTE — Consult Note (Signed)
   [facility] CM Inpatient Consult   10/03/2015  LANITA PANARELLO 1970/06/05 [id number]   Came by to see [name] on behalf of [facility] Care Management program for [employer] employees/dependents with [facility] insurance. She was sleeping soundly. Will continue to follow along.   Marthenia Rolling, MSN-Ed, RN,BSN [facility] Liaison [phone]

## 2015-10-03 NOTE — Progress Notes (Signed)
Nutrition Follow-up  DOCUMENTATION CODES:   Obesity unspecified, Severe malnutrition in context of acute illness/injury  INTERVENTION:  -When medically appropriate, restart Osmolite 1.2 boluses via G-tube, 1 can - 3 times daily, 0900, 1400,  1900 -Begin with 1/2 can, if patient tolerates complete and move forward with 1 can TID  -63mL free water after each feeding -Try to increase to goal of 5 cans prior to discharge. -5 cans provides 1425 calories, 66 grams of protein, and 1172mL free water -Encourage PO intake, water intake  -Premier Protein Shakes 2oz. Q2H  NUTRITION DIAGNOSIS:   Inadequate oral intake related to altered GI function, poor appetite, other (see comment) (throat pain, gastric pain) as evidenced by per patient/family report. 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 Pt no longer has picc line or TPN, g tube was placed. Pt is still unable to eat significant amounts, consumes unjury at home and some "body armor" ( carb drink).  She is now 2 days s/p revision of roux limb, diagnostic laparoscopy. Per MD note, RD consulted to provide tube feeds equal to 75% of nutritional needs. Pt's PO intake at home is very limited, but pt is concerned with feedings and wants to continue to lose weight.   Spoke with pt, she was given ativan shortly before my visit and therefore was lethargic and drowsy, eventually fell asleep. States "it was a rough nite."  Pt did not tolerate osmolite cans yesterday, complained of tightness and pain in her upper abdomen/lower chest. CT was ordered by Dr. Redmond Pulling that found tube may not have been in the lumen of her stomach  which would explain patient able to tolerate protein PO but not tubefeeding. Will await replacement, per RN conversation with Dr. Redmond Pulling he will place orders RD previously had in.  Recommend we begin with 1/2 can tomorrow morning to make sure patient tolerates. If she is unable to tolerate that, continuous feeds would be our next option.  Will continue to monitor pt.  Labs and medications reviewed: NaCl with KCl 85mEq @ 141mL/hr Multivitamin liquid  Diet Order:  Diet NPO time specified  Skin:  Reviewed, no issues  Last BM:  7/19  Height:   Ht Readings from Last 1 Encounters:  09/30/15 5\' 8"  (1.727 m)    Weight:   Wt Readings from Last 1 Encounters:  09/30/15 235 lb (106.6 kg)    Ideal Body Weight:  63.63 kg  BMI:  Body mass index is 35.73 kg/m.  Estimated Nutritional Needs:   Kcal:  1900-2100 calories  Protein:  90-100 grams  Fluid:  >/= 1.9L  EDUCATION NEEDS:   Education needs addressed  Satira Anis. Jayjay Littles, MS, RD LDN Inpatient Clinical Dietitian Pager [phone]

## 2015-10-03 NOTE — Progress Notes (Signed)
Patient alert and oriented, Post op day 2.   Encouraged pulmonary toilet and ambulation, patient is currently NPO awaiting IR procedure to replace tube.  Per Dr Redmond Pulling, I explained plan of care, NPO status and plans to go to IR for tube placement.  Patient sleepy and emotional offered support and encouragement.  All questions answered.  Will continue to monitor.

## 2015-10-03 NOTE — Progress Notes (Addendum)
Pt was unable to tolerate any of the 8:00pm scheduled Osmolite or flush via bolus. Although, patient has been able to tolerate protein 118mls+ by mouth. Patient has continuous complaints of upper left abdominal pain near g-tube site. MD made aware. Will continue to monitor.

## 2015-10-04 ENCOUNTER — Encounter (HOSPITAL_COMMUNITY): Payer: Self-pay | Admitting: Diagnostic Radiology

## 2015-10-04 ENCOUNTER — Inpatient Hospital Stay (HOSPITAL_COMMUNITY): Payer: 59

## 2015-10-04 HISTORY — PX: IR GENERIC HISTORICAL: IMG1180011

## 2015-10-04 LAB — PROTIME-INR
INR: 1.1
PROTHROMBIN TIME: 14.2 s (ref 11.4–15.2)

## 2015-10-04 LAB — BASIC METABOLIC PANEL
Anion gap: 4 — ABNORMAL LOW (ref 5–15)
BUN: 10 mg/dL (ref 6–20)
CALCIUM: 8.3 mg/dL — AB (ref 8.9–10.3)
CO2: 25 mmol/L (ref 22–32)
CREATININE: 1.02 mg/dL — AB (ref 0.44–1.00)
Chloride: 106 mmol/L (ref 101–111)
GFR calc Af Amer: >60 mL/min (ref >60)
GLUCOSE: 79 mg/dL (ref 65–99)
POTASSIUM: 4.5 mmol/L (ref 3.5–5.1)
Sodium: 135 mmol/L (ref 135–145)

## 2015-10-04 LAB — MAGNESIUM: Magnesium: 1.6 mg/dL — ABNORMAL LOW (ref 1.7–2.4)

## 2015-10-04 MED ORDER — FENTANYL CITRATE (PF) 100 MCG/2ML IJ SOLN
INTRAMUSCULAR | Status: AC | PRN
  Administered 2015-10-04 (×3): 50 ug via INTRAVENOUS
  Administered 2015-10-04 (×2): 25 ug via INTRAVENOUS

## 2015-10-04 MED ORDER — CIPROFLOXACIN IN D5W 400 MG/200ML IV SOLN
INTRAVENOUS | Status: AC
  Filled 2015-10-04: qty 200

## 2015-10-04 MED ORDER — IOPAMIDOL (ISOVUE-300) INJECTION 61%
10.0000 mL | Freq: Once | INTRAVENOUS | Status: AC | PRN
  Administered 2015-10-04: 10 mL

## 2015-10-04 MED ORDER — ONDANSETRON HCL 4 MG/2ML IJ SOLN
INTRAMUSCULAR | Status: AC
  Filled 2015-10-04: qty 2

## 2015-10-04 MED ORDER — MAGNESIUM SULFATE 2 GM/50ML IV SOLN
2.0000 g | Freq: Once | INTRAVENOUS | Status: AC
  Administered 2015-10-04: 2 g via INTRAVENOUS
  Filled 2015-10-04: qty 50

## 2015-10-04 MED ORDER — MIDAZOLAM HCL 2 MG/2ML IJ SOLN
INTRAMUSCULAR | Status: AC
  Filled 2015-10-04: qty 8

## 2015-10-04 MED ORDER — FENTANYL CITRATE (PF) 100 MCG/2ML IJ SOLN
INTRAMUSCULAR | Status: AC
  Filled 2015-10-04: qty 4

## 2015-10-04 MED ORDER — MIDAZOLAM HCL 2 MG/2ML IJ SOLN
INTRAMUSCULAR | Status: AC | PRN
  Administered 2015-10-04 (×7): 1 mg via INTRAVENOUS

## 2015-10-04 MED ORDER — ENOXAPARIN SODIUM 30 MG/0.3ML ~~LOC~~ SOLN
30.0000 mg | Freq: Two times a day (BID) | SUBCUTANEOUS | Status: DC
  Administered 2015-10-05 – 2015-10-09 (×9): 30 mg via SUBCUTANEOUS
  Filled 2015-10-04 (×9): qty 0.3

## 2015-10-04 MED ORDER — LIDOCAINE HCL 1 % IJ SOLN
INTRAMUSCULAR | Status: AC
  Filled 2015-10-04: qty 20

## 2015-10-04 NOTE — Consult Note (Signed)
Chief Complaint: Patient was seen in consultation today for gastrostomy tube exchange/manipulation                                                       Referring Physician(s): Lykens  Supervising Physician: Aletta Edouard  Patient Status: Inpatient  History of Present Illness: Margaret Fletcher is a 45 y.o. 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 Since that time she has had persistent epigastric pain and some protein calorie malnutrition.On 10/01/15 she underwent laparoscopy with placement of a 24 French G-tube in the excluded stomach/revision of the Roux limb. She has continued to have pain and swelling around the G-tube insertion site with some radiation into the left flank. CT scan performed on 7/27 showed that the gastrostomy tube tip appeared to buckle the adjacent gastric remnant anterior wall inward and not be within the lumen of the gastric remnant. The tip was centered over the junction of the abdominal wall musculature with the anterior wall of the gastric remnant. Request now received from surgery for advancement of the tube into the excluded stomach.  Past Medical History:  Diagnosis Date  . Alopecia   . Anxiety   . Autoimmune disease ([place])    "Alopecia"  . Bacterial vaginosis   . Cyst of spleen   . Depression   . Depression   . Frequent UTI   . Hypertrophy of breast   . IIH (idiopathic intracranial hypertension) 2017   "related to headaches"  . Migraine   . Migraine headache    denies, ruled out - Propranolol.Using for headaches    Past Surgical History:  Procedure Laterality Date  . APPENDECTOMY    . BREAST REDUCTION SURGERY Bilateral 03/26/2015   Procedure: BILATERAL BREAST REDUCTION  ;  Surgeon: Youlanda Roys, MD;  Location: [facility];  Service: Plastics;  Laterality: Bilateral;  . BREATH TEK H PYLORI N/A 05/21/2014   Procedure: BREATH TEK H PYLORI;   Surgeon: Greer Pickerel, MD;  Location: [facility];  Service: General;  Laterality: N/A;  . CESAREAN SECTION    . CESAREAN SECTION    . CHOLECYSTECTOMY    . DILITATION & CURRETTAGE/HYSTROSCOPY WITH NOVASURE ABLATION N/A 07/19/2012   Procedure: HYSTEROSCOPY WITH NOVASURE ABLATION;  Surgeon: Farrel Gobble. Harrington Challenger, MD;  Location: [facility];  Service: Gynecology;  Laterality: N/A;  . ENDOMETRIAL ABLATION    . ESOPHAGOGASTRODUODENOSCOPY N/A 08/16/2015   Procedure: UPPER ESOPHAGOGASTRODUODENOSCOPY (EGD);  Surgeon: Greer Pickerel, MD;  Location: [facility];  Service: General;  Laterality: N/A;  . LAPAROSCOPIC GASTROSTOMY N/A 09/30/2015   Procedure: LAPAROSCOPIC GASTROSTOMY TUBE PLACEMENT;  Surgeon: Greer Pickerel, MD;  Location: [facility];  Service: General;  Laterality: N/A;  . LAPAROSCOPIC ROUX-EN-Y GASTRIC BYPASS WITH HIATAL HERNIA REPAIR N/A 07/08/2015   Procedure: LAPAROSCOPIC ROUX-EN-Y GASTRIC BYPASS WITH HIATAL HERNIA REPAIR WITH UPPER ENDOSCOPY;  Surgeon: Greer Pickerel, MD;  Location: [facility];  Service: General;  Laterality: N/A;  . LAPAROSCOPIC SMALL BOWEL RESECTION N/A 09/30/2015   Procedure: LAPAROSCOPIC REVISION OF ROUX LIMB;  Surgeon: Greer Pickerel, MD;  Location: [facility];  Service: General;  Laterality: N/A;  . LAPAROSCOPY N/A 09/30/2015   Procedure: LAPAROSCOPY DIAGNOSTIC;  Surgeon: Greer Pickerel, MD;  Location: [facility];  Service: General;  Laterality: N/A;  .  TUBAL LIGATION    . WISDOM TOOTH EXTRACTION      Allergies: Sulfa antibiotics and Zosyn [piperacillin sod-tazobactam so]  Medications: Prior to Admission medications   Medication Sig Start Date End Date Taking? Authorizing Provider  ALPRAZolam Duanne Moron) 0.5 MG tablet Take 1 tablet (0.5 mg total) by mouth 2 (two) times daily as needed for anxiety. Can take 1-2 30 minutes before MRI. Do not drive. 07/04/15  Yes Robyn Haber, MD  buPROPion (WELLBUTRIN XL) 300 MG 24 hr tablet Take 1 tablet (300 mg total) by mouth daily. 07/04/15  Yes Robyn Haber, MD    CALCIUM-VITAMIN D PO Take 15 mLs by mouth 3 (three) times daily.   Yes Historical Provider, MD  Cyanocobalamin (VITAMIN B 12 PO) Take 1 tablet by mouth daily.   Yes Historical Provider, MD  ondansetron (ZOFRAN-ODT) 4 MG disintegrating tablet Take 1 tablet (4 mg total) by mouth every 6 (six) hours as needed for nausea. 07/19/15  Yes Greer Pickerel, MD  oxyCODONE (ROXICODONE) 5 MG/5ML solution Take 2.5 mLs (2.5 mg total) by mouth every 4 (four) hours as needed for moderate pain or severe pain (before meals as needed for pain). 08/19/15  Yes Greer Pickerel, MD  pantoprazole (PROTONIX) 40 MG tablet Take 40 mg by mouth daily. 07/19/15  Yes Historical Provider, MD  propranolol (INDERAL) 10 MG tablet Take 10 mg by mouth 2 (two) times daily.  09/02/15  Yes Historical Provider, MD  SUMAtriptan (IMITREX) 20 MG/ACT nasal spray Place 1 spray (20 mg total) into the nose once. May repeat in 2 hours if headache persists or recurs. Patient taking differently: Place 20 mg into the nose every 2 (two) hours as needed for migraine. May repeat in 2 hours if headache persists or recurs. 09/02/15  Yes Melvenia Beam, MD  ciprofloxacin (CIPRO) 500 MG tablet Take 1 tablet (500 mg total) by mouth 2 (two) times daily. 09/14/15   Velvet Bathe, MD  thiamine [id number] mg, folic acid 1 mg, multivitamins adult 10 mL in sodium chloride 0.9 % 1,000 mL Inject 1,000 mLs into the vein once. 1L IV every 3 days; first dose Sunday Patient not taking: Reported on 09/30/2015 07/19/15   Greer Pickerel, MD  topiramate (TOPAMAX) 100 MG tablet Take 2 tablets (200 mg total) by mouth at bedtime. Patient taking differently: Take 100 mg by mouth daily.  07/22/15   Melvenia Beam, MD     Family History  Problem Relation Age of Onset  . Cancer Mother   . Stroke Father   . Diabetes Brother   . Seizures Son   . Migraines Neg Hx     Social History   Social History  . Marital status: Divorced    Spouse name: N/A  . Number of children: 2  . Years of education: 16    Occupational History  . [employer]- nurse tech    Social History Main Topics  . Smoking status: Never Smoker  . Smokeless tobacco: Never Used  . Alcohol use Yes     Comment: socially  . Drug use: No  . Sexual activity: Yes    Birth control/ protection: Surgical   Other Topics Concern  . None   Social History Narrative   Lives with partner   Caffeine use: minimal coffee      Review of Systems currently denies fever, headache, vomiting or abnormal bleeding. She does have occasional chest discomfort/ dyspnea, abdominal and back discomfort and nausea.  Vital Signs: BP 114/72 (BP Location: Left  Arm)   Pulse (!) 116   Temp 99.3 F (37.4 C) (Oral)   Resp 16   Ht 5\' 8"  (1.727 m)   Wt 235 lb (106.6 kg)   LMP 09/10/2015 Comment: spotting  SpO2 100%   BMI 35.73 kg/m   Physical Exam patient awake, alert. Chest with slightly diminished breath sounds at bases. Heart with regular rate and rhythm. Abdomen obese, soft, gastrostomy tube intact but moderate tenderness noted around and above insertion site and extending along left lateral abdominal region; lower extremities- no edema.  Mallampati Score:     Imaging: Ct Abdomen Wo Contrast  Result Date: 10/03/2015 CLINICAL DATA:  Postop day 2 from laparoscopic revision of gastric bypass roux limb. Laparoscopic placement of gastrostomy tube and excluded stomach remnant. Pain around G-tube site. EXAM: CT ABDOMEN WITHOUT CONTRAST TECHNIQUE: Multidetector CT imaging of the abdomen was performed following the standard protocol without IV contrast. COMPARISON:  08/06/2015 and 03/21/2013 FINDINGS: Lower chest: Possible tiny amount left pleural fluid with mild bibasilar atelectasis. Hepatobiliary: Previous cholecystectomy. The liver and biliary tree are within normal. Pancreas: No mass or inflammatory process identified on this un-enhanced exam. Spleen: Within normal limits in size. Adrenals/Urinary Tract: No evidence of urolithiasis or  hydronephrosis. No definite mass visualized on this un-enhanced exam. Stomach/Bowel: Postsurgical change compatible previous gastric bypass procedure. Evidence of patient's percutaneous gastrostomy tube which abuts and appears to buckle the anterior wall of the excluded gastric segment inward. Tip of the tube likely not within the lumen of the gastric remnant. Balloon of the gastrostomy tube is not inflated as the tip of the tube is centered over the junction of the abdominal wall musculature with the anterior wall of the gastric remnant and likely not within the lumen of the remnant. This could be advanced approximately 1.5 cm. There is a surgical suture line over a loop of small bowel in the left mid abdomen. Small bowel is otherwise within normal. Moderate fecal retention of the right colon as the colon is otherwise within normal. Vascular/Lymphatic: No pathologically enlarged lymph nodes. No evidence of abdominal aortic aneurysm. Other: None. Musculoskeletal:  No suspicious bone lesions identified. IMPRESSION: Evidence of patient's previous gastric bypass procedure. Percutaneous gastrostomy tube tip appears to buckle the adjacent gastric remnant anterior wall inward and not be within the lumen of the gastric remnant. Balloon is not inflated. The tip is centered over the junction of the abdominal wall musculature with the anterior wall of the gastric remnant and could be advanced approximately 1.5 cm. Tiny amount of left pleural fluid bibasilar dependent atelectasis. Electronically Signed   By: Marin Olp M.D.   On: 10/03/2015 11:36  Dg Chest 2 View  Result Date: 09/11/2015 CLINICAL DATA:  Fever, headaches EXAM: CHEST  2 VIEW COMPARISON:  None. FINDINGS: Right-sided PICC line with the tip projecting over the SVC. There is no focal parenchymal opacity. There is no pleural effusion or pneumothorax. The heart and mediastinal contours are unremarkable. The osseous structures are unremarkable. IMPRESSION: No  active cardiopulmonary disease. Electronically Signed   By: Kathreen Devoid   On: 09/11/2015 08:00    Labs:  CBC:  Recent Labs  09/12/15 0332 09/26/15 1225 10/01/15 0424 10/02/15 0508  WBC 5.4 6.9 8.5 10.8*  HGB 10.7* 12.7 11.9* 10.5*  HCT 32.6* 39.1 36.5 32.0*  PLT 252 341 264 246    COAGS:  Recent Labs  10/04/15 0357  INR 1.10    BMP:  Recent Labs  09/11/15 0746 09/12/15 0332 09/26/15 1225  10/01/15 0424  NA 137 139 138 135  K 4.1 3.8 4.4 4.9  CL 111 116* 106 107  CO2 19* 21* 25 22  GLUCOSE 116* 94 94 110*  BUN 24* 13 17 12   CALCIUM 8.5* 7.7* 8.7* 8.6*  CREATININE 1.07* 1.21* 1.04* 1.13*  GFRNONAA >60 53* >60 58*  GFRAA >60 >60 >60 >60    LIVER FUNCTION TESTS:  Recent Labs  09/11/15 0746 09/12/15 0332 09/26/15 1225 10/01/15 0424  BILITOT 0.4 0.4 0.4 0.5  AST 22 26 22 25   ALT 27 33 22 23  ALKPHOS 54 48 58 52  PROT 6.9 5.8* 7.3 6.5  ALBUMIN 3.2* 2.6* 3.7 3.1*    TUMOR MARKERS: No results for input(s): AFPTM, CEA, CA199, CHROMGRNA in the last 8760 hours.  Assessment and Plan:  45 y.o. 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 Since that time she has had persistent epigastric pain and some protein calorie malnutrition.On 10/01/15 she underwent laparoscopy with placement of a 24 French G-tube in the excluded stomach/revision of the Roux limb. She has continued to have pain and swelling around the G-tube insertion site with some radiation into the left flank. CT scan performed on 7/27 showed that the gastrostomy tube tip appeared to buckle the adjacent gastric remnant anterior wall inward and not be within the lumen of the gastric remnant. The tip was centered over the junction of the abdominal wall musculature with the anterior wall of the gastric remnant. Request now received from surgery for advancement of the tube into the excluded stomach. Imaging studies were reviewed by Dr.  Kathlene Cote. Plan at this time is to attempt gastrostomy tube repositioning this afternoon under IV conscious sedation. Details/risks of procedure, including but not limited to, internal bleeding, infection, inability to advance tube, need for possible additional surgery discussed with patient with her understanding and consent.   Thank you for this interesting consult.  I greatly enjoyed meeting Margaret Fletcher and look forward to participating in their care.  A copy of this report was sent to the requesting provider on this date.  Electronically Signed: D. Rowe Robert 10/04/2015, 9:21 AM   I spent a total of 30 minutes    in face to face in clinical consultation, greater than 50% of which was counseling/coordinating care for gastrostomy tube advancement/exchange / manipulation

## 2015-10-04 NOTE — Progress Notes (Signed)
Patient transferred to room 1436, currently asymptomatic.  Report called to [place] on [place].

## 2015-10-04 NOTE — Procedures (Signed)
Post-Procedure Note  Pre-operative Diagnosis: Malpositioned G-tube       Post-operative Diagnosis: Malpositioned G-tube   Indications: Non-functioning G-tube and concern for malpositioning based on CT.  Procedure Details:  Unable to advance a wire or catheter thru existing G-tube.  Old G-tube was not in gastric lumen.  Removed 24 French G-tube and successfully cannulated old tract with 5 French catheter and Glidewire.  Tract dilated and new 16 French balloon retention G-tube placed.  Findings: New G-tube in stomach.  Placement confirmed with contrast injection.  Complications: None.  However, asymptomatic V-tach noted prior to procedure.     Condition: Stable  Plan: G-tube is ready to use.  Discussed V-tach with Dr. Redmond Pulling.  Plan to send to telemetry bed.

## 2015-10-04 NOTE — Progress Notes (Signed)
Nutrition Follow-up  DOCUMENTATION CODES:   Obesity unspecified, Severe malnutrition in context of acute illness/injury  INTERVENTION:  -When medically appropriate, restart Osmolite 1.2 boluses via G-tube, 1 can - 3 times daily, 0900, 1400,  1900 -Begin with 1/2 can, if patient tolerates complete and move forward with 1 can TID  -31mL free water after each feeding -Try to increase to goal of 5 cans prior to discharge. -5 cans provides 1425 calories, 66 grams of protein, and 1160mL free water -Encourage PO intake, water intake  -Premier Protein Shakes 2oz. Q2H  NUTRITION DIAGNOSIS:   Inadequate oral intake related to altered GI function, poor appetite, other (see comment) (throat pain, gastric pain) as evidenced by per patient/family report. 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 Pt no longer has picc line or TPN, g tube was placed. Pt is still unable to eat significant amounts, consumes unjury at home and some "body armor" ( carb drink).  She is now 2 days s/p revision of roux limb, diagnostic laparoscopy. Per MD note, RD consulted to provide tube feeds equal to 75% of nutritional needs. Pt's PO intake at home is very limited, but pt is concerned with feedings and wants to continue to lose weight.   Pt headed to IR today for advancement of G-tube NPO at this time. Will follow-up when feedings are restarted.  Labs and Medications reviewed: Multivitamin Liquid NaCL with KCL 56mEq  @ 165mL/hr  7/27 Spoke with pt, she was given ativan shortly before my visit and therefore was lethargic and drowsy, eventually fell asleep. States "it was a rough  nite."  Pt did not tolerate osmolite cans yesterday, complained of tightness and pain in her upper abdomen/lower chest. CT was ordered by Dr. Redmond Fletcher that found tube may not have been in the lumen of her stomach which would explain patient able to tolerate protein PO but not tubefeeding. Will await replacement, per RN conversation with Dr. Redmond Fletcher he will place orders RD previously had in.  Recommend we begin with 1/2 can tomorrow morning to make sure patient tolerates. If she is unable to tolerate that, continuous feeds would be our next option.  Will continue to monitor pt.   Diet Order:  Diet NPO time specified Except for: Sips with Meds  Skin:  Reviewed, no issues  Last BM:  7/19  Height:   Ht Readings from Last 1 Encounters:  09/30/15 5\' 8"  (1.727 m)    Weight:   Wt Readings from Last 1 Encounters:  09/30/15 235 lb (106.6 kg)    Ideal Body Weight:  63.63 kg  BMI:  Body mass index is 35.73 kg/m.  Estimated Nutritional Needs:   Kcal:  1900-2100 calories  Protein:  90-100 grams  Fluid:  >/= 1.9L  EDUCATION NEEDS:   Education needs addressed  Margaret Fletcher. Margaret Valade, MS, RD LDN Inpatient Clinical Dietitian Pager [phone]

## 2015-10-04 NOTE — Progress Notes (Signed)
4 Days Post-Op  Subjective: Events noted. New G tube placed in excluded stomach. Feels ok. No n/v. No cp/sob/palpitations. Just tired.   Objective: Vital signs in last 24 hours: Temp:  [98.2 F (36.8 C)-99.3 F (37.4 C)] 98.4 F (36.9 C) (07/28 1615) Pulse Rate:  [62-116] 62 (07/28 1615) Resp:  [12-18] 14 (07/28 1615) BP: (114-146)/(72-88) 126/77 (07/28 1615) SpO2:  [98 %-100 %] 100 % (07/28 1615) Last BM Date: 09/29/15  Intake/Output from previous day: 07/27 0701 - 07/28 0700 In: 3180 [P.O.:180; I.V.:3000] Out: 3000 [Urine:3000] Intake/Output this shift: Total I/O In: 0  Out: 800 [Urine:800]  Alert, nontoxic Symmetric chest rise, clear Reg, sinus Soft, incisions c/d/i; g tube clamped. Very little TTP. +SCDs  Lab Results:   Recent Labs  10/02/15 0508  WBC 10.8*  HGB 10.5*  HCT 32.0*  PLT 246   BMET  Recent Labs  10/04/15 0400  NA 135  K 4.5  CL 106  CO2 25  GLUCOSE 79  BUN 10  CREATININE 1.02*  CALCIUM 8.3*   PT/INR  Recent Labs  10/04/15 0357  LABPROT 14.2  INR 1.10   ABG No results for input(s): PHART, HCO3 in the last 72 hours.  Invalid input(s): PCO2, PO2  Studies/Results: Ct Abdomen Wo Contrast  Result Date: 10/03/2015 CLINICAL DATA:  Postop day 2 from laparoscopic revision of gastric bypass roux limb. Laparoscopic placement of gastrostomy tube and excluded stomach remnant. Pain around G-tube site. EXAM: CT ABDOMEN WITHOUT CONTRAST TECHNIQUE: Multidetector CT imaging of the abdomen was performed following the standard protocol without IV contrast. COMPARISON:  08/06/2015 and 03/21/2013 FINDINGS: Lower chest: Possible tiny amount left pleural fluid with mild bibasilar atelectasis. Hepatobiliary: Previous cholecystectomy. The liver and biliary tree are within normal. Pancreas: No mass or inflammatory process identified on this un-enhanced exam. Spleen: Within normal limits in size. Adrenals/Urinary Tract: No evidence of urolithiasis or  hydronephrosis. No definite mass visualized on this un-enhanced exam. Stomach/Bowel: Postsurgical change compatible previous gastric bypass procedure. Evidence of patient's percutaneous gastrostomy tube which abuts and appears to buckle the anterior wall of the excluded gastric segment inward. Tip of the tube likely not within the lumen of the gastric remnant. Balloon of the gastrostomy tube is not inflated as the tip of the tube is centered over the junction of the abdominal wall musculature with the anterior wall of the gastric remnant and likely not within the lumen of the remnant. This could be advanced approximately 1.5 cm. There is a surgical suture line over a loop of small bowel in the left mid abdomen. Small bowel is otherwise within normal. Moderate fecal retention of the right colon as the colon is otherwise within normal. Vascular/Lymphatic: No pathologically enlarged lymph nodes. No evidence of abdominal aortic aneurysm. Other: None. Musculoskeletal:  No suspicious bone lesions identified. IMPRESSION: Evidence of patient's previous gastric bypass procedure. Percutaneous gastrostomy tube tip appears to buckle the adjacent gastric remnant anterior wall inward and not be within the lumen of the gastric remnant. Balloon is not inflated. The tip is centered over the junction of the abdominal wall musculature with the anterior wall of the gastric remnant and could be advanced approximately 1.5 cm. Tiny amount of left pleural fluid bibasilar dependent atelectasis. Electronically Signed   By: Marin Olp M.D.   On: 10/03/2015 11:36   Anti-infectives: Anti-infectives    Start     Dose/Rate Route Frequency Ordered Stop   10/04/15 1420  ciprofloxacin (CIPRO) 400 MG/200ML IVPB    Comments:  Margaretmary Dys   : cabinet override      10/04/15 1420 10/04/15 1432   10/04/15 1400  ciprofloxacin (CIPRO) IVPB 400 mg     400 mg 200 mL/hr over 60 Minutes Intravenous To Radiology 10/03/15 1720 10/04/15 1525    10/03/15 1700  ciprofloxacin (CIPRO) IVPB 400 mg  Status:  Discontinued     400 mg 200 mL/hr over 60 Minutes Intravenous To Radiology 10/03/15 1631 10/03/15 1720   10/01/15 0100  ciprofloxacin (CIPRO) IVPB 400 mg     400 mg 200 mL/hr over 60 Minutes Intravenous  Once 09/30/15 1803 10/01/15 0128   09/30/15 2000  fluconazole (DIFLUCAN) IVPB 150 mg     150 mg 75 mL/hr over 60 Minutes Intravenous Every 24 hours 09/30/15 1803 09/30/15 2112   09/30/15 1057  ciprofloxacin (CIPRO) IVPB 400 mg     400 mg 200 mL/hr over 60 Minutes Intravenous On call to O.R. 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 VERY appreciative of IR/Dr Henn's efforts to get g tube re-inserted.  Will clamp g tube tonight and not use tonight Pt can have bariatric fulls as tolerated Resume prophylactic lovenox in am V tach - in NSR now. Asymptomatic. Mag was 1.6 this am. Will replace Mag. Repeat labs in am. Cont telemetry. In no events within 24hrs will dc telemetry  Plan for weekend - resume bolus can tube feeds via g tube to get 50-75% of daily kcal needs  Leighton Ruff. Redmond Pulling, MD, FACS General, Bariatric, & Minimally Invasive Surgery [facility], [place]    LOS: 4 days    [name] 10/04/2015

## 2015-10-04 NOTE — Sedation Documentation (Signed)
Pt with 14-15 beat run NSVT.  Pt asymtomatic with VSWNL.  Dr. Anselm Pancoast aware and willing to proceed.  Rowe Robert, PA to follow up.

## 2015-10-05 LAB — BASIC METABOLIC PANEL
ANION GAP: 5 (ref 5–15)
BUN: 9 mg/dL (ref 6–20)
CALCIUM: 8.5 mg/dL — AB (ref 8.9–10.3)
CO2: 25 mmol/L (ref 22–32)
Chloride: 106 mmol/L (ref 101–111)
Creatinine, Ser: 0.97 mg/dL (ref 0.44–1.00)
GLUCOSE: 88 mg/dL (ref 65–99)
POTASSIUM: 4.4 mmol/L (ref 3.5–5.1)
SODIUM: 136 mmol/L (ref 135–145)

## 2015-10-05 LAB — CBC
HCT: 33.3 % — ABNORMAL LOW (ref 36.0–46.0)
HEMOGLOBIN: 11.1 g/dL — AB (ref 12.0–15.0)
MCH: 30.9 pg (ref 26.0–34.0)
MCHC: 33.3 g/dL (ref 30.0–36.0)
MCV: 92.8 fL (ref 78.0–100.0)
Platelets: 269 10*3/uL (ref 150–400)
RBC: 3.59 MIL/uL — ABNORMAL LOW (ref 3.87–5.11)
RDW: 13.6 % (ref 11.5–15.5)
WBC: 8.5 10*3/uL (ref 4.0–10.5)

## 2015-10-05 NOTE — Progress Notes (Signed)
Patient complaining of severe mid abdominal stabbing-like pain which she rates 10/10 on pain scale. She states it began while she was eating. She states that she vomited. Clear, mucous and fluid noted on towel patient holding in lap. Zofran and Morphine given IV per PRN orders. Dr. Hassell Done notified via telephone and instructed to continue to monitor patient and call with concerns. Order obtained for CBC/DIFF for the a.m. Report given to night RN.

## 2015-10-05 NOTE — Progress Notes (Signed)
Patient ID: Margaret Fletcher, female   DOB: 01-23-1971, 45 y.o.   MRN: [id number] [facility] Progress Note:   5 Days Post-Op  Subjective: Mental status is clear.  Sore from procedure yesterday.  Wants cottage cheese etc to eat.   Objective: Vital signs in last 24 hours: Temp:  [98.2 F (36.8 C)-99.4 F (37.4 C)] 98.6 F (37 C) (07/29 0445) Pulse Rate:  [61-83] 67 (07/29 0445) Resp:  [12-18] 18 (07/29 0445) BP: (116-146)/(73-88) 121/73 (07/29 0445) SpO2:  [95 %-100 %] 97 % (07/29 0445)  Intake/Output from previous day: 07/28 0701 - 07/29 0700 In: 1030 [P.O.:480; I.V.:500; IV Piggyback:50] Out: 800 [Urine:800] Intake/Output this shift: Total I/O In: 1052.1 [I.V.:1002.1; IV Piggyback:50] Out: -   Physical Exam: Work of breathing is normal.  Tube replaced per IR-not in use now.    Lab Results:  Results for orders placed or performed during the hospital encounter of 09/30/15 (from the past 48 hour(s))  Protime-INR     Status: None   Collection Time: 10/04/15  3:57 AM  Result Value Ref Range   Prothrombin Time 14.2 11.4 - 15.2 seconds   INR 2.95   Basic metabolic panel     Status: Abnormal   Collection Time: 10/04/15  4:00 AM  Result Value Ref Range   Sodium 135 135 - 145 mmol/L   Potassium 4.5 3.5 - 5.1 mmol/L   Chloride 106 101 - 111 mmol/L   CO2 25 22 - 32 mmol/L   Glucose, Bld 79 65 - 99 mg/dL   BUN 10 6 - 20 mg/dL   Creatinine, Ser 1.02 (H) 0.44 - 1.00 mg/dL   Calcium 8.3 (L) 8.9 - 10.3 mg/dL   GFR calc non Af Amer >60 >60 mL/min   GFR calc Af Amer >60 >60 mL/min    Comment: (NOTE) The eGFR has been calculated using the CKD EPI equation. This calculation has not been validated in all clinical situations. eGFR's persistently <60 mL/min signify possible Chronic Kidney Disease.    Anion gap 4 (L) 5 - 15  Magnesium     Status: Abnormal   Collection Time: 10/04/15  4:00 AM  Result Value Ref Range   Magnesium 1.6 (L) 1.7 - 2.4 mg/dL  Basic metabolic panel      Status: Abnormal   Collection Time: 10/05/15  5:20 AM  Result Value Ref Range   Sodium 136 135 - 145 mmol/L   Potassium 4.4 3.5 - 5.1 mmol/L   Chloride 106 101 - 111 mmol/L   CO2 25 22 - 32 mmol/L   Glucose, Bld 88 65 - 99 mg/dL   BUN 9 6 - 20 mg/dL   Creatinine, Ser 0.97 0.44 - 1.00 mg/dL   Calcium 8.5 (L) 8.9 - 10.3 mg/dL   GFR calc non Af Amer >60 >60 mL/min   GFR calc Af Amer >60 >60 mL/min    Comment: (NOTE) The eGFR has been calculated using the CKD EPI equation. This calculation has not been validated in all clinical situations. eGFR's persistently <60 mL/min signify possible Chronic Kidney Disease.    Anion gap 5 5 - 15  CBC     Status: Abnormal   Collection Time: 10/05/15  5:20 AM  Result Value Ref Range   WBC 8.5 4.0 - 10.5 K/uL   RBC 3.59 (L) 3.87 - 5.11 MIL/uL   Hemoglobin 11.1 (L) 12.0 - 15.0 g/dL   HCT 33.3 (L) 36.0 - 46.0 %   MCV 92.8 78.0 -  100.0 fL   MCH 30.9 26.0 - 34.0 pg   MCHC 33.3 30.0 - 36.0 g/dL   RDW 13.6 11.5 - 15.5 %   Platelets 269 150 - 400 K/uL    Radiology/Results: Ct Abdomen Wo Contrast  Result Date: 10/03/2015 CLINICAL DATA:  Postop day 2 from laparoscopic revision of gastric bypass roux limb. Laparoscopic placement of gastrostomy tube and excluded stomach remnant. Pain around G-tube site. EXAM: CT ABDOMEN WITHOUT CONTRAST TECHNIQUE: Multidetector CT imaging of the abdomen was performed following the standard protocol without IV contrast. COMPARISON:  08/06/2015 and 03/21/2013 FINDINGS: Lower chest: Possible tiny amount left pleural fluid with mild bibasilar atelectasis. Hepatobiliary: Previous cholecystectomy. The liver and biliary tree are within normal. Pancreas: No mass or inflammatory process identified on this un-enhanced exam. Spleen: Within normal limits in size. Adrenals/Urinary Tract: No evidence of urolithiasis or hydronephrosis. No definite mass visualized on this un-enhanced exam. Stomach/Bowel: Postsurgical change compatible  previous gastric bypass procedure. Evidence of patient's percutaneous gastrostomy tube which abuts and appears to buckle the anterior wall of the excluded gastric segment inward. Tip of the tube likely not within the lumen of the gastric remnant. Balloon of the gastrostomy tube is not inflated as the tip of the tube is centered over the junction of the abdominal wall musculature with the anterior wall of the gastric remnant and likely not within the lumen of the remnant. This could be advanced approximately 1.5 cm. There is a surgical suture line over a loop of small bowel in the left mid abdomen. Small bowel is otherwise within normal. Moderate fecal retention of the right colon as the colon is otherwise within normal. Vascular/Lymphatic: No pathologically enlarged lymph nodes. No evidence of abdominal aortic aneurysm. Other: None. Musculoskeletal:  No suspicious bone lesions identified. IMPRESSION: Evidence of patient's previous gastric bypass procedure. Percutaneous gastrostomy tube tip appears to buckle the adjacent gastric remnant anterior wall inward and not be within the lumen of the gastric remnant. Balloon is not inflated. The tip is centered over the junction of the abdominal wall musculature with the anterior wall of the gastric remnant and could be advanced approximately 1.5 cm. Tiny amount of left pleural fluid bibasilar dependent atelectasis. Electronically Signed   By: Marin Olp M.D.   On: 10/03/2015 11:36  Ir Replc Gastro/colonic Tube Percut W/fluoro  Result Date: 10/04/2015 INDICATION: 45 year old with 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 Laparoscopic placement of 24 French gastrostomy tube on 09/30/2015. The gastrostomy tube is not functioning well and concern for malpositioning or dislodgement based on recent CT. EXAM: GASTROSTOMY CATHETER REPLACEMENT MEDICATIONS: See anesthesia/sedation ANESTHESIA/SEDATION: Versed 7.0 mg IV; Fentanyl 200 mcg  IV Moderate Sedation Time:  45 minutes The patient was continuously monitored during the procedure by the interventional radiology nurse under my direct supervision. CONTRAST:  54m ISOVUE-300 IOPAMIDOL (ISOVUE-300) INJECTION 61% - administered into the gastric lumen. FLUOROSCOPY TIME:  Fluoroscopy Time: 14 minutes 54 seconds (323 mGy). COMPLICATIONS: None immediate. PROCEDURE: Informed written consent was obtained from the patient after a thorough discussion of the procedural risks, benefits and alternatives. All questions were addressed. Maximal Sterile Barrier Technique was utilized including caps, mask, sterile gowns, sterile gloves, sterile drape, hand hygiene and skin antiseptic. A timeout was performed prior to the initiation of the procedure. The existing G-tube was prepped and draped in sterile fashion. Contrast would not easily injected through the catheter. A 5 French catheter and Glidewire were advanced through the 24 FPakistancatheter. Wire advanced through the  tube but it was coiling around the catheter. Advancement of the wire was also causing the patient a lot of pain. The 24 French tube was completely removed. Using fluoroscopic guidance, 5 French catheter was eventually advanced into the gastric lumen with a Glidewire. A stiff Amplatz wire was placed. The tract was dilated up to 18 [place]. A 16 French balloon retention gastrostomy tube was successfully advanced over the wire. Contrast injection confirmed placement in the stomach. The balloon was inflated with 5 mL of saline. FINDINGS: The 45 French catheter was not within the gastric lumen. 5 French catheter was successfully advanced into the gastric lumen and 16 [place] G-tube was placed. Contrast injection confirmed placement in the stomach. IMPRESSION: Successful replacement of the gastrostomy tube. The patient now has a 22 [place] balloon retention gastrostomy tube. Electronically Signed   By: Markus Daft M.D.   On: 10/04/2015  18:45   Anti-infectives: Anti-infectives    Start     Dose/Rate Route Frequency Ordered Stop   10/04/15 1420  ciprofloxacin (CIPRO) 400 MG/200ML IVPB    Comments:  Margaretmary Dys   : cabinet override      10/04/15 1420 10/04/15 1432   10/04/15 1400  ciprofloxacin (CIPRO) IVPB 400 mg     400 mg 200 mL/hr over 60 Minutes Intravenous To Radiology 10/03/15 1720 10/04/15 1525   10/03/15 1700  ciprofloxacin (CIPRO) IVPB 400 mg  Status:  Discontinued     400 mg 200 mL/hr over 60 Minutes Intravenous To Radiology 10/03/15 1631 10/03/15 1720   10/01/15 0100  ciprofloxacin (CIPRO) IVPB 400 mg     400 mg 200 mL/hr over 60 Minutes Intravenous  Once 09/30/15 1803 10/01/15 0128   09/30/15 2000  fluconazole (DIFLUCAN) IVPB 150 mg     150 mg 75 mL/hr over 60 Minutes Intravenous Every 24 hours 09/30/15 1803 09/30/15 2112   09/30/15 1057  ciprofloxacin (CIPRO) IVPB 400 mg     400 mg 200 mL/hr over 60 Minutes Intravenous On call to O.R. 09/30/15 1057 09/30/15 1425      Assessment/Plan: Problem List: Patient Active Problem List   Diagnosis Date Noted  . Bacteremia 09/12/2015  . Fever 09/11/2015  . Protein-calorie malnutrition, mild ([place]) 08/19/2015  . Epigastric pain 08/16/2015  . Dehydration 07/15/2015  . Prediabetes 07/08/2015  . Morbid obesity ([place]) 07/08/2015  . Mild obstructive sleep apnea 07/08/2015  . Dyslipidemia 07/08/2015  . S/P gastric bypass 07/08/2015  . IIH (idiopathic intracranial hypertension) 06/03/2015  . Chronic migraine without aura or status migrainosus 04/30/2015  . Worsening headaches 04/30/2015  . Vision changes 04/30/2015  . Perceived hearing changes 04/30/2015  . Liver lesion 03/21/2013  . Splenic cyst 03/21/2013  . Abdominal pain 03/21/2013    Diet adjusted to bariatric advanced.   5 Days Post-Op    LOS: 5 days   Matt B. Hassell Done, MD, [facility] [phone] beeper [phone]  10/05/2015 9:03 AM

## 2015-10-06 LAB — CBC WITH DIFFERENTIAL/PLATELET
Basophils Absolute: 0.1 10*3/uL (ref 0.0–0.1)
Basophils Relative: 1 %
EOS ABS: 0.6 10*3/uL (ref 0.0–0.7)
EOS PCT: 7 %
HCT: 34.7 % — ABNORMAL LOW (ref 36.0–46.0)
HEMOGLOBIN: 11.3 g/dL — AB (ref 12.0–15.0)
LYMPHS ABS: 2.4 10*3/uL (ref 0.7–4.0)
Lymphocytes Relative: 27 %
MCH: 30.5 pg (ref 26.0–34.0)
MCHC: 32.6 g/dL (ref 30.0–36.0)
MCV: 93.5 fL (ref 78.0–100.0)
MONOS PCT: 11 %
Monocytes Absolute: 0.9 10*3/uL (ref 0.1–1.0)
Neutro Abs: 4.8 10*3/uL (ref 1.7–7.7)
Neutrophils Relative %: 54 %
PLATELETS: 258 10*3/uL (ref 150–400)
RBC: 3.71 MIL/uL — ABNORMAL LOW (ref 3.87–5.11)
RDW: 13.4 % (ref 11.5–15.5)
WBC: 8.8 10*3/uL (ref 4.0–10.5)

## 2015-10-06 NOTE — Progress Notes (Signed)
Patient ID: SHANTELE RELLER, female   DOB: 1970/11/28, 45 y.o.   MRN: [id number] [facility] Surgery Progress Note:   6 Days Post-Op  Subjective: Mental status is clear.  Pain that she was having last night after eating has resolved.   Objective: Vital signs in last 24 hours: Temp:  [98.6 F (37 C)-99.3 F (37.4 C)] 99 F (37.2 C) (07/30 0520) Pulse Rate:  [62-75] 67 (07/30 0520) Resp:  [19-20] 20 (07/30 0520) BP: (119-142)/(72-78) 119/72 (07/30 0520) SpO2:  [93 %-99 %] 96 % (07/30 0520)  Intake/Output from previous day: 07/29 0701 - 07/30 0700 In: 3470 [P.O.:420; I.V.:3000; IV Piggyback:50] Out: 1350 [Urine:1350] Intake/Output this shift: No intake/output data recorded.  Physical Exam: Work of breathing is not labored.  Eating soft soft foods but mainly drinking full liquids.    Lab Results:  Results for orders placed or performed during the hospital encounter of 09/30/15 (from the past 48 hour(s))  Basic metabolic panel     Status: Abnormal   Collection Time: 10/05/15  5:20 AM  Result Value Ref Range   Sodium 136 135 - 145 mmol/L   Potassium 4.4 3.5 - 5.1 mmol/L   Chloride 106 101 - 111 mmol/L   CO2 25 22 - 32 mmol/L   Glucose, Bld 88 65 - 99 mg/dL   BUN 9 6 - 20 mg/dL   Creatinine, Ser 0.97 0.44 - 1.00 mg/dL   Calcium 8.5 (L) 8.9 - 10.3 mg/dL   GFR calc non Af Amer >60 >60 mL/min   GFR calc Af Amer >60 >60 mL/min    Comment: (NOTE) The eGFR has been calculated using the CKD EPI equation. This calculation has not been validated in all clinical situations. eGFR's persistently <60 mL/min signify possible Chronic Kidney Disease.    Anion gap 5 5 - 15  CBC     Status: Abnormal   Collection Time: 10/05/15  5:20 AM  Result Value Ref Range   WBC 8.5 4.0 - 10.5 K/uL   RBC 3.59 (L) 3.87 - 5.11 MIL/uL   Hemoglobin 11.1 (L) 12.0 - 15.0 g/dL   HCT 33.3 (L) 36.0 - 46.0 %   MCV 92.8 78.0 - 100.0 fL   MCH 30.9 26.0 - 34.0 pg   MCHC 33.3 30.0 - 36.0 g/dL   RDW 13.6 11.5  - 15.5 %   Platelets 269 150 - 400 K/uL  CBC with Differential/Platelet     Status: Abnormal   Collection Time: 10/06/15  5:10 AM  Result Value Ref Range   WBC 8.8 4.0 - 10.5 K/uL   RBC 3.71 (L) 3.87 - 5.11 MIL/uL   Hemoglobin 11.3 (L) 12.0 - 15.0 g/dL   HCT 34.7 (L) 36.0 - 46.0 %   MCV 93.5 78.0 - 100.0 fL   MCH 30.5 26.0 - 34.0 pg   MCHC 32.6 30.0 - 36.0 g/dL   RDW 13.4 11.5 - 15.5 %   Platelets 258 150 - 400 K/uL   Neutrophils Relative % 54 %   Neutro Abs 4.8 1.7 - 7.7 K/uL   Lymphocytes Relative 27 %   Lymphs Abs 2.4 0.7 - 4.0 K/uL   Monocytes Relative 11 %   Monocytes Absolute 0.9 0.1 - 1.0 K/uL   Eosinophils Relative 7 %   Eosinophils Absolute 0.6 0.0 - 0.7 K/uL   Basophils Relative 1 %   Basophils Absolute 0.1 0.0 - 0.1 K/uL    Radiology/Results: Ir Replc Gastro/colonic Tube Percut W/fluoro  Result Date: 10/04/2015  INDICATION: 45 year old with 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 Laparoscopic placement of 24 French gastrostomy tube on 09/30/2015. The gastrostomy tube is not functioning well and concern for malpositioning or dislodgement based on recent CT. EXAM: GASTROSTOMY CATHETER REPLACEMENT MEDICATIONS: See anesthesia/sedation ANESTHESIA/SEDATION: Versed 7.0 mg IV; Fentanyl 200 mcg IV Moderate Sedation Time:  45 minutes The patient was continuously monitored during the procedure by the interventional radiology nurse under my direct supervision. CONTRAST:  55m ISOVUE-300 IOPAMIDOL (ISOVUE-300) INJECTION 61% - administered into the gastric lumen. FLUOROSCOPY TIME:  Fluoroscopy Time: 14 minutes 54 seconds (323 mGy). COMPLICATIONS: None immediate. PROCEDURE: Informed written consent was obtained from the patient after a thorough discussion of the procedural risks, benefits and alternatives. All questions were addressed. Maximal Sterile Barrier Technique was utilized including caps, mask, sterile gowns, sterile gloves, sterile drape,  hand hygiene and skin antiseptic. A timeout was performed prior to the initiation of the procedure. The existing G-tube was prepped and draped in sterile fashion. Contrast would not easily injected through the catheter. A 5 French catheter and Glidewire were advanced through the 24 FPakistancatheter. Wire advanced through the tube but it was coiling around the catheter. Advancement of the wire was also causing the patient a lot of pain. The 24 French tube was completely removed. Using fluoroscopic guidance, 5 French catheter was eventually advanced into the gastric lumen with a Glidewire. A stiff Amplatz wire was placed. The tract was dilated up to 18 [place] A 16 French balloon retention gastrostomy tube was successfully advanced over the wire. Contrast injection confirmed placement in the stomach. The balloon was inflated with 5 mL of saline. FINDINGS: The 225French catheter was not within the gastric lumen. 5 French catheter was successfully advanced into the gastric lumen and 16 [place]-tube was placed. Contrast injection confirmed placement in the stomach. IMPRESSION: Successful replacement of the gastrostomy tube. The patient now has a 139FP[place]balloon retention gastrostomy tube. Electronically Signed   By: AMarkus DaftM.D.   On: 10/04/2015 18:45   Anti-infectives: Anti-infectives    Start     Dose/Rate Route Frequency Ordered Stop   10/04/15 1420  ciprofloxacin (CIPRO) 400 MG/200ML IVPB    Comments:  BMargaretmary Dys  : cabinet override      10/04/15 1420 10/04/15 1432   10/04/15 1400  ciprofloxacin (CIPRO) IVPB 400 mg     400 mg 200 mL/hr over 60 Minutes Intravenous To Radiology 10/03/15 1720 10/04/15 1525   10/03/15 1700  ciprofloxacin (CIPRO) IVPB 400 mg  Status:  Discontinued     400 mg 200 mL/hr over 60 Minutes Intravenous To Radiology 10/03/15 1631 10/03/15 1720   10/01/15 0100  ciprofloxacin (CIPRO) IVPB 400 mg     400 mg 200 mL/hr over 60 Minutes Intravenous  Once 09/30/15 1803 10/01/15  0128   09/30/15 2000  fluconazole (DIFLUCAN) IVPB 150 mg     150 mg 75 mL/hr over 60 Minutes Intravenous Every 24 hours 09/30/15 1803 09/30/15 2112   09/30/15 1057  ciprofloxacin (CIPRO) IVPB 400 mg     400 mg 200 mL/hr over 60 Minutes Intravenous On call to O.R. 09/30/15 1057 09/30/15 1425      Assessment/Plan: Problem List: Patient Active Problem List   Diagnosis Date Noted  . Bacteremia 09/12/2015  . Fever 09/11/2015  . Protein[place]calorie malnutrition, mild ([place] 08/19/2015  . Epigastric pain 08/16/2015  . Dehydration 07/15/2015  . Prediabetes 07/08/2015  . Morbid obesity ([place] 07/08/2015  .  Mild obstructive sleep apnea 07/08/2015  . Dyslipidemia 07/08/2015  . S/P gastric bypass 07/08/2015  . IIH (idiopathic intracranial hypertension) 06/03/2015  . Chronic migraine without aura or status migrainosus 04/30/2015  . Worsening headaches 04/30/2015  . Vision changes 04/30/2015  . Perceived hearing changes 04/30/2015  . Liver lesion 03/21/2013  . Splenic cyst 03/21/2013  . Abdominal pain 03/21/2013    Pain better this am.  Will defer to Dr. Redmond Pulling about starting feedings through her feeding tube.  BMI 35 does not equate with severe protein calore malnutrition.   6 Days Post-Op    LOS: 6 days   Matt B. Hassell Done, MD, [facility] [phone] beeper [phone]  10/06/2015 9:50 AM

## 2015-10-07 DIAGNOSIS — E441 Mild protein-calorie malnutrition: Secondary | ICD-10-CM | POA: Diagnosis present

## 2015-10-07 LAB — GLUCOSE, CAPILLARY: Glucose-Capillary: 104 mg/dL — ABNORMAL HIGH (ref 65–99)

## 2015-10-07 MED ORDER — FLEET ENEMA 7-19 GM/118ML RE ENEM
1.0000 | ENEMA | Freq: Once | RECTAL | Status: AC
  Administered 2015-10-07: 1 via RECTAL
  Filled 2015-10-07: qty 1

## 2015-10-07 MED ORDER — OXYCODONE HCL 5 MG/5ML PO SOLN: 2.5000 mg | Freq: Four times a day (QID) | ORAL | 0 refills | Status: DC | PRN

## 2015-10-07 MED ORDER — OSMOLITE 1.2 CAL PO LIQD
237.0000 mL | Freq: Three times a day (TID) | ORAL | Status: DC
Start: 2015-10-07 — End: 2015-10-08
  Administered 2015-10-07 – 2015-10-08 (×3): 237 mL
  Filled 2015-10-07 (×3): qty 237

## 2015-10-07 NOTE — Progress Notes (Signed)
I have accepted care of the patient. Report was given by the previous nurse and I agree with her assessment.

## 2015-10-07 NOTE — Progress Notes (Addendum)
7 Days Post-Op  Subjective: Drank 2 protein shakes on Sunday. No g tube feeds over weekend. No bm.  Objective: Vital signs in last 24 hours: Temp:  [98.3 F (36.8 C)-98.6 F (37 C)] 98.3 F (36.8 C) (07/31 0534) Pulse Rate:  [61-80] 80 (07/31 1348) Resp:  [18-20] 20 (07/31 0534) BP: (104-123)/(62-74) 123/74 (07/31 1348) SpO2:  [97 %-100 %] 100 % (07/31 1348) Last BM Date: 09/29/15  Intake/Output from previous day: 07/30 0701 - 07/31 0700 In: 2988 [P.O.:240; I.V.:2748] Out: 1025 [Urine:1025] Intake/Output this shift: Total I/O In: 1250 [I.V.:1000; IV Piggyback:250] Out: -   Seems depressed cta Reg Soft, nd, incisions c/d/i; g tube intact. capped  Lab Results:   Recent Labs  10/05/15 0520 10/06/15 0510  WBC 8.5 8.8  HGB 11.1* 11.3*  HCT 33.3* 34.7*  PLT 269 258   BMET  Recent Labs  10/05/15 0520  NA 136  K 4.4  CL 106  CO2 25  GLUCOSE 88  BUN 9  CREATININE 0.97  CALCIUM 8.5*   PT/INR No results for input(s): LABPROT, INR in the last 72 hours. ABG No results for input(s): PHART, HCO3 in the last 72 hours.  Invalid input(s): PCO2, PO2  Studies/Results: No results found.  Anti-infectives: Anti-infectives    Start     Dose/Rate Route Frequency Ordered Stop   10/04/15 1420  ciprofloxacin (CIPRO) 400 MG/200ML IVPB    Comments:  Margaret Fletcher   : cabinet override      10/04/15 1420 10/04/15 1432   10/04/15 1400  ciprofloxacin (CIPRO) IVPB 400 mg     400 mg 200 mL/hr over 60 Minutes Intravenous To Radiology 10/03/15 1720 10/04/15 1525   10/03/15 1700  ciprofloxacin (CIPRO) IVPB 400 mg  Status:  Discontinued     400 mg 200 mL/hr over 60 Minutes Intravenous To Radiology 10/03/15 1631 10/03/15 1720   10/01/15 0100  ciprofloxacin (CIPRO) IVPB 400 mg     400 mg 200 mL/hr over 60 Minutes Intravenous  Once 09/30/15 1803 10/01/15 0128   09/30/15 2000  fluconazole (DIFLUCAN) IVPB 150 mg     150 mg 75 mL/hr over 60 Minutes Intravenous Every 24 hours  09/30/15 1803 09/30/15 2112   09/30/15 1057  ciprofloxacin (CIPRO) IVPB 400 mg     40 0 mg 200 mL/hr over 60 Minutes Intravenous On call to O.R. 09/30/15 1057 09/30/15 1425      Assessment/Plan: s/p Procedure(s): LAPAROSCOPY DIAGNOSTIC (N/A) LAPAROSCOPIC GASTROSTOMY TUBE PLACEMENT (N/A) LAPAROSCOPIC REVISION OF ROUX LIMB (N/A)  Will start g tube teaching. Will give some TF thru g tube Cont chemical vte prophylaxis Will have her f/u with psychologist as outpt.  Had planned for her to go home today but doesn't seem like that is realistic since pt has not had any TF via g tube to make sure tolerates or teaching per pt.   Margaret Fletcher has "fullness" with oral intake in her epigastrium. She is concerned with gaining weight/not continuing to lose weight if uses a lot of TF. My plan with the G tube was for her to have a mental break from trying to get her recommended fluid/nutrition after gastric bypass via MOUTH. Instead use the G tube for her to get in her recommended protein/fluid that we typically instruct our postop gastric bypass pts to get. I was/am hopeful this will take pressure off of her to try to get in by mouth and avoid that "full" sensation. The g tube is in her excluded stomach and the feeds would  go down her Biliopancreatic limb to her jejunojejunostomy and then down common channel thus avoiding her pouch and roux limb and hopefully this "full" sensation. May consider going NPO by mouth except for liquids for comfort  Margaret Ruff. Redmond Pulling, MD, FACS General, Bariatric, & Minimally Invasive Surgery [facility], [place]   LOS: 7 days    [name] 10/07/2015

## 2015-10-07 NOTE — Progress Notes (Signed)
Nutrition Follow-up  DOCUMENTATION CODES:   Obesity unspecified, Severe malnutrition in context of acute illness/injury  INTERVENTION:  -When medically appropriate, restartOsmolite 1.2 boluses via G-tube, 1 can - 3 times daily, 0900, 1400,  1900 -Begin with 1/2 can, if patient tolerates complete and move forward with 1 can TID -56mL free water after each feeding -Try to increase to goal of 5 cans prior to discharge. -5 cans provides 1425 calories, 66 grams of protein, and 1175mL free water -Encourage PO intake, water intake  -Premier Protein Shakes 2oz. Q2H  NUTRITION DIAGNOSIS:   Inadequate oral intake related to altered GI function, poor appetite, other (see comment) (throat pain, gastric pain) as evidenced by per patient/family report. 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 Pt no longer has picc line or TPN, g tube was placed. Pt is still unable to eat significant amounts, consumes unjury at home and some "body armor" ( carb drink).  She is now 2 days s/p revision of roux limb, diagnostic laparoscopy. Per MD note, RD consulted to provide tube feeds equal to 75% of nutritional needs. Pt's PO intake at home is very limited, but pt is concerned with feedings and wants to continue to lose weight.   Margaret Fletcher continues with poor PO intake. She is on bariatric advanced now eating some soft foods 7/30 but did not have a tray for dinner last night, no trays today. Endorses a lot of pain, nausea at this time. During my visit she had recently had a large bowel movement, was not feeling well. Will continue to follow for restart of tube feeds, monitor PO  intake. G tube has been advanced now. No new weights.  Labs and medications reviewed: CBGs 104, Mg 1.6 Multivitamin Liquid, premier Protein Q2H NaCL with KCL 37mEq @ 171mL/hr Diet Order:  Diet bariatric advanced Room service appropriate? Yes; Fluid consistency: Thin  Skin:  Reviewed, no issues  Last BM:  7/19  Height:   Ht Readings from Last 1 Encounters:  09/30/15 5\' 8"  (1.727 m)    Weight:   Wt Readings from Last 1 Encounters:  09/30/15 235 lb (106.6 kg)    Ideal Body Weight:  63.63 kg  BMI:  Body mass index is 35.73 kg/m.  Estimated Nutritional Needs:   Kcal:  1900-2100 calories  Protein:  90-100 grams  Fluid:  >/= 1.9L  EDUCATION NEEDS:   Education needs addressed  Margaret Anis. Guelda Batson, MS, RD LDN Inpatient Clinical Dietitian Pager [phone]

## 2015-10-08 DIAGNOSIS — E43 Unspecified severe protein-calorie malnutrition: Secondary | ICD-10-CM | POA: Insufficient documentation

## 2015-10-08 MED ORDER — POLYETHYLENE GLYCOL 3350 17 G PO PACK
17.0000 g | PACK | Freq: Every day | ORAL | Status: DC
  Administered 2015-10-08 – 2015-10-09 (×2): 17 g via ORAL
  Filled 2015-10-08 (×2): qty 1

## 2015-10-08 MED ORDER — VITAL HIGH PROTEIN PO LIQD
1000.0000 mL | ORAL | Status: DC
  Administered 2015-10-08 – 2015-10-09 (×3): 1000 mL
  Filled 2015-10-08 (×2): qty 1000

## 2015-10-08 MED ORDER — SODIUM CHLORIDE 0.9 % IV BOLUS (SEPSIS): 500.0000 mL | Freq: Once | INTRAVENOUS | Status: DC

## 2015-10-08 MED ORDER — SODIUM CHLORIDE 0.9 % IV BOLUS (SEPSIS)
500.0000 mL | Freq: Once | INTRAVENOUS | Status: AC
  Administered 2015-10-08: 500 mL via INTRAVENOUS

## 2015-10-08 NOTE — Progress Notes (Signed)
Pt's b/p dropped to 85/60 (92/62 manual) after propranolol was given. Paged on call. Order was given for a 500cc bolus of n/s.  Conception Oms

## 2015-10-08 NOTE — Progress Notes (Addendum)
Nutrition Follow-up  DOCUMENTATION CODES:   Obesity unspecified, Severe malnutrition in context of acute illness/injury  INTERVENTION:  - Will pt to Nocturnal feeds, from 2000-0800 -Initiate Vital High Protein via G-Tube @ 26mL/hr, increase by 10 every 4 hours to goal rate of 41mL/hr -At goal, provides 800 calories, 63 grams of protein, and 602cc free water  NUTRITION DIAGNOSIS:   Inadequate oral intake related to altered GI function, poor appetite, other (see comment) (throat pain, gastric pain) as evidenced by per patient/family report. 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 Pt no longer has picc line or TPN, g tube was placed. Pt is still unable to eat significant amounts, consumes unjury at home and some "body armor" ( carb drink).  She is now 2 days s/p revision of roux limb, diagnostic laparoscopy. Per MD note, RD consulted to provide tube feeds equal to 75% of nutritional needs. Pt's PO intake at home is very limited, but pt is concerned with feedings and wants to continue to lose weight.   [name] has continued to struggle with bolus feeds. She states she had some pain last night, but was able to tolerate 2 cans of Osmolite 1.2 yesterday. This morning she could only tolerate 181mls.  She denied nausea or vomiting. No PO intake thus far this morning. She has failed boluses. Spoke with Dr. Redmond Pulling, will begin nocturnal feeds from 2000-0800, with Vital High Protein. Hydrolyzed, peptide based formula should be better tolerated and will meet protein goals without the need for pro-stat.  Labs and Medications reviewed: Mg 1.6 Multivitamin  liquid NaCL - KCL @ 146mL/hr  Diet Order:  Diet bariatric advanced Room service appropriate? Yes; Fluid consistency: Thin  Skin:  Reviewed, no issues  Last BM:  7/19  Height:   Ht Readings from Last 1 Encounters:  09/30/15 5\' 8"  (1.727 m)    Weight:   Wt Readings from Last 1 Encounters:  09/30/15 235 lb (106.6 kg)    Ideal Body Weight:  63.63 kg  BMI:  Body mass index is 35.73 kg/m.  Estimated Nutritional Needs:   Kcal:  1590 (25 cal/kg IBW)  Protein:  60 grams  Fluid:  >/= 1.9L  EDUCATION NEEDS:   Education needs addressed  Satira Anis. [place], MS, RD LDN Inpatient Clinical Dietitian Pager [phone]

## 2015-10-08 NOTE — Progress Notes (Signed)
Pt tearful after conversation with family member on telephone. At pt's agreement, 2drops lavender eo placed on gauze in paper med cup and placed at bedside for inhalation aromatherapy at 1657. Pt now states that she feels the aromatherapy assisted her in mood improvement.

## 2015-10-08 NOTE — Progress Notes (Signed)
8 Days Post-Op  Subjective: Doing better. Spirits seem better. Had some tuna that went well. Had some deli meat and cheese that went well. Had some fullness with TF bolus this am. Ambulating. Concerned that with both TF and oral intake that she may get too full and not be able to recognize it til after the fact.   Objective: Vital signs in last 24 hours: Temp:  [98.5 F (36.9 C)-99.7 F (37.6 C)] 98.5 F (36.9 C) (08/01 1500) Pulse Rate:  [68-72] 69 (08/01 1500) Resp:  [16-18] 18 (08/01 1500) BP: (85-136)/(60-83) 136/83 (08/01 1500) SpO2:  [93 %-100 %] 100 % (08/01 1500) Last BM Date: 10/07/15  Intake/Output from previous day: 07/31 0701 - 08/01 0700 In: 2972 [I.V.:2375; NG/GT:297; IV Piggyback:300] Out: 400 [Urine:400] Intake/Output this shift: Total I/O In: 1350 [I.V.:1125; NG/GT:175; IV Piggyback:50] Out: -   Alert, seems more upbeat today.  Soft, nt, nd, g tube secure No edema  Lab Results:   Recent Labs  10/06/15 0510  WBC 8.8  HGB 11.3*  HCT 34.7*  PLT 258   BMET No results for input(s): NA, K, CL, CO2, GLUCOSE, BUN, CREATININE, CALCIUM in the last 72 hours. PT/INR No results for input(s): LABPROT, INR in the last 72 hours. ABG No results for input(s): PHART, HCO3 in the last 72 hours.  Invalid input(s): PCO2, PO2  Studies/Results: No results found.  Anti-infectives: Anti-infectives    Start     Dose/Rate Route Frequency Ordered Stop   10/04/15 1420  ciprofloxacin (CIPRO) 400 MG/200ML IVPB    Comments:  Margaretmary Dys   : cabinet override      10/04/15 1420 10/04/15 1432   10/04/15 1400  ciprofloxacin (CIPRO) IVPB 400 mg     400 mg 200 mL/hr over 60 Minutes Intravenous To Radiology 10/03/15 1720 10/04/15 1525   10/03/15 1700  ciprofloxacin (CIPRO) IVPB 400 mg  Status:  Discontinued     400 mg 200 mL/hr over 60 Minutes Intravenous To Radiology 10/03/15 1631 10/03/15 1720   10/01/15 0100  ciprofloxacin (CIPRO) IVPB 400 mg     400 mg 200 mL/hr over  60 Minutes Intravenous  Once 09/30/15 1803 10/01/15 0128   09/30/15 2000  fluconazole (DIFLUCAN) IVPB 150 mg     150 mg 75 mL/hr over 60 Minutes Intravenous Every 24 hours 09/30/15 1803 09/30/15 2112   09/30/15 1057  ciprofloxacin (CIPRO) IVPB 400 mg     400 mg 200 mL/hr over 60 Minutes Intravenous On call to O.R. 09/30/15 1057 09/30/15 1425      Assessment/Plan: s/p Procedure(s): LAPAROSCOPY DIAGNOSTIC (N/A) LAPAROSCOPIC GASTROSTOMY TUBE PLACEMENT (N/A) LAPAROSCOPIC REVISION OF ROUX LIMB (N/A) S/p IR replacement of dislodged G tube  Had long conversation with Jakeline.  She can cont to eat by mouth as tolerated but shouldn't force. (already seems the amount she can eat by mouth is subjectively better now). If having a great day with oral intake, then won't need as much supplemental enteral feeds via g tube.  If having a bad day with oral intake, then she will take in most of her daily calories via g tube.   Pt to get cyclic tube feeds tonight.    In am will reassess which went better - cyclic tube feeds vs bolus tube feeds.    Plan dc on Wednesday with home health for enteral feeds. TBD whether night time tube feeds or bolus TF as needed for poor oral intake.   Decrease IVF rate.   Leighton Ruff. Redmond Pulling,  MD, FACS General, Bariatric, & Minimally Invasive Surgery [facility]   LOS: 8 days    [facility] M 10/08/2015

## 2015-10-09 MED ORDER — POLYETHYLENE GLYCOL 3350 17 G PO PACK: 17.0000 g | PACK | Freq: Every day | ORAL | 0 refills | Status: DC

## 2015-10-09 MED ORDER — VITAL HIGH PROTEIN PO LIQD: 1000.0000 mL | ORAL | 8 refills | Status: DC

## 2015-10-09 MED FILL — POLYETHYLENE GLYCOL 3350: 14 days supply | Qty: 255 | Fill #0

## 2015-10-09 NOTE — Progress Notes (Signed)
Nutrition Follow-up  DOCUMENTATION CODES:   Obesity unspecified, Severe malnutrition in context of acute illness/injury  INTERVENTION:  -Continue Nocturnal feeds, from 2000-0800 -Vital High Protein via G-Tube @ 60mL/hr -At goal, provides 800 calories, 63 grams of protein, and 602cc free water  NUTRITION DIAGNOSIS:   Inadequate oral intake related to altered GI function, poor appetite, other (see comment) (throat pain, gastric pain) as evidenced by per patient/family report. 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 Pt no longer has picc line or TPN, g tube was placed. Pt is still unable to eat significant amounts, consumes unjury at home and some "body armor" ( carb drink).  She is now 2 days s/p revision of roux limb, diagnostic laparoscopy. Per MD note, RD consulted to provide tube feeds equal to 75% of nutritional needs. Pt's PO intake at home is very limited, but pt is concerned with feedings and wants to continue to lose weight.   Margaret Fletcher ate some meat and cheese yesterday, had not eaten anything during my visit today. Sipped a little premier protein. She tolerated nocturnal feeds well, slept throughout the night but continued to have moderate pain/tightness this morning when she awoke. Pt walked around to help alleviate but did not work, turned tubefeed off shortly before it was completed tolerated approximately 11hrs, 45 mins. Significant improvement from Osmolite 1.2!  She expressed some anxiety around nocturnal feeds, abilitiy to control it while she is sleeping.  Discussed options patient has including slower boluses via pump or pouring 1/4-1/2  cans as tolerated.  Overall she is anxious to go home. Provided emotional support for pt, recently had to severe relationship with s/o --> feels alone during this time.  Likely to d/c today, if not will follow up tomorrow.  Labs and medications reviewed: Multivitamin liquid NaCL w/ KCL 77mEq @ 28mL/hr  Diet Order:  Diet bariatric advanced Room service appropriate? Yes; Fluid consistency: Thin  Skin:  Reviewed, no issues  Last BM:  7/19  Height:   Ht Readings from Last 1 Encounters:  09/30/15 5\' 8"  (1.727 m)    Weight:   Wt Readings from Last 1 Encounters:  09/30/15 235 lb (106.6 kg)    Ideal Body Weight:  63.63 kg  BMI:  Body mass index is 35.73 kg/m.  Estimated Nutritional Needs:   Kcal:  1590 (25 cal/kg IBW)  Protein:  60 grams  Fluid:  >/= 1.9L  EDUCATION NEEDS:   Education needs addressed  Satira Anis. Thelda Gagan, MS, RD LDN Inpatient Clinical Dietitian Pager [phone]

## 2015-10-09 NOTE — Progress Notes (Signed)
Chaplain follow up to make appointment with counseling services.  Pt in meeting with care team.  Requested chaplain provide number to [facility].  Chaplain provided contact for [facility] and card with this chaplain's contact information.    [place], [place]

## 2015-10-09 NOTE — Discharge Instructions (Signed)
Your goal is to get in 64 oz of water/fluid a day either by mouth or g tube. If you are having a good day with eating by mouth then you may skip supplemental G tube feeds that night. If you have not done well with eating/drinking by mouth, then you need to give yourself Vital High Protein 40cc/hr for at least 8 hrs with 300cc water flush q 4hrs.  Flush G tube before starting G tube feeds and after finishing G tube feeds with 10-20cc of water to clear the tube.    GASTRIC BYPASS/SLEEVE  Home Care Instructions   These instructions are to help you care for yourself when you go home.  Call: If you have any problems.  Call [phone] and ask for the surgeon on call  If you need immediate assistance come to the ER at [facility]. Tell the ER staff you are a new post-op gastric bypass or gastric sleeve patient  Signs and symptoms to report:  Severe  vomiting or nausea o If you cannot handle clear liquids for longer than 1 day, call your surgeon  Abdominal pain which does not get better after taking your pain medication  Fever greater than 100.4  F and chills  Heart rate over 100 beats a minute  Trouble breathing  Chest pain  Redness,  swelling, drainage, or foul odor at incision (surgical) sites  If your incisions open or pull apart  Swelling or pain in calf (lower leg)  Diarrhea (Loose bowel movements that happen often), frequent watery, uncontrolled bowel movements  Constipation, (no bowel movements for 3 days) if this happens: o Take Milk of Magnesia, 2 tablespoons by mouth, 3 times a day for 2 days if needed o Stop taking Milk of Magnesia once you have had a bowel movement o Call your doctor if constipation continues Or o Take Miralax  (instead of Milk of Magnesia) following the label instructions o Stop taking Miralax once you have had a bowel movement o Call your doctor if constipation continues  Anything you think is abnormal for you   Activity and Exercise: It is  important to continue walking at home. Limit your physical activity as instructed by your doctor. During this time, use these guidelines:  Do not lift anything greater than ten  (10) pounds for at least two (2) weeks  Do not go back to work or drive until Engineer, production says you can  You may have sex when you feel comfortable o It is VERY important for female patients to use a reliable birth control method; fertility often increase after surgery o Do not get pregnant for at least 18 months  Start exercising as soon as your doctor tells you that you can o Make sure your doctor approves any physical activity  Start with a simple walking program  Walk 5-15 minutes each day, 7 days per week  Slowly increase until you are walking 30-45 minutes per day  Consider joining our [place] program. [phone] or email belt@[employer] .edu      [facility] has a free Bariatric Surgery Support Group that meets monthly, the 3rd Thursday, [place], [facility] [place]:  [facility]: [phone]               Bariatric Nurse Coordinator:  by [place] Patient Education Committee, Jan, 2014 ° ° ° ° ° ° ° ° ° °

## 2015-10-09 NOTE — Progress Notes (Signed)
Pt selected [place] for [facility] needs. Will need MD order's for [facility], tube feed, rate, free water, and tube feed pump.

## 2015-10-09 NOTE — Progress Notes (Signed)
Chaplain referred by RN.  Providing emotional support around loss, chronic illness.   Presented with flat affect, tearful.  Spoke with chaplain about loss of relationship with S/O.  Margaret Fletcher is planning on discharging home alone.  Describes lack of support from S/O and feeling "backed against the wall" to make decision to end relationship.  Feels alone in chronic illness / complications from sx.  Describes feeling forced to have surgery due to symptoms and feeling isolation when others tell her "you shouldn't have chosen that."    Margaret Fletcher is interested in following up with EACP counseling.  Chaplain and Margaret Fletcher were making appointment at bedside when a guest arrived in room.  Margaret Fletcher wishes chaplain to follow up in order to complete appointment process.    [place], [place]

## 2015-10-09 NOTE — Progress Notes (Signed)
Patient alert and oriented, pain is controlled. Patient is tolerating fluids, as well as tube feed at 54ml/hour.  Reviewed Bariatric diet, stage IIb and IIIA with patient.  Patient was instructed to supplement oral intake with tube feeding.  Goal to achieve 64-10 oz of liquids and 60g of protein per day.  All questions answered, patient is able to articulate understanding.

## 2015-10-10 ENCOUNTER — Telehealth: Payer: Self-pay | Admitting: Neurology

## 2015-10-10 MED FILL — PANTOPRAZOLE SOD DR 40 MG T: 40 | 30 days supply | Qty: 30 | Fill #1

## 2015-10-10 MED FILL — ONDANSETRON ODT 4 MG TABLET: 4 | 5 days supply | Qty: 20 | Fill #0

## 2015-10-10 MED FILL — oxyCODONE HCL 5 MG/5ML SOLN: 5 | 4 days supply | Qty: 60 | Fill #0

## 2015-10-10 NOTE — Discharge Summary (Signed)
Physician Discharge Summary  Margaret Fletcher [id number] DOB: 1970-09-27 DOA: 09/30/2015  PCP: [name], MD  Admit date: 09/30/2015 Discharge date: 10/10/2015  Recommendations for Outpatient Follow-up:  1.   Follow-up [place], MD .   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   prediabetes  Surgical Procedure: laparoscopic revision of Roux limb, laparoscopic placement of a 24 French gastrotomy tube and excluded stomach July 24  Discharge Condition: good Disposition: home with home health services for supplemental enteral feeds  Diet recommendation: advanced bariatric diet as tolerated with supplemental enteral tube feeds consisting of vital high-protein; for days that she does not take good nutrition by mouth patient is to receive 40 cc of vital high-protein per hour for 8 hours along with 300 cc of free water boluses every 4 hours while connected to tube feeding pump; if patient has a good day with oral intake and meets recommended daily fluid and protein intake goals by mouth then she does not have to have tube feeds that night  Filed Weights   09/30/15 1117  Weight: 106.6 kg (235 lb)    History of present illness:  45 year old African-American female underwent routine laparoscopic Roux-en-Y gastric bypass for morbid obesity, prediabetes, mild obstructive sleep apnea, and idiopathic intracranial hypertension on May 1.  Her postoperative course has been complicated by persistent ongoing epigastric discomfort with oral intake which is limiting her ability to meet nutritional needs.  She was placed on TPN through a PICC line but that had to be discontinued due to bacteremia.  Despite  upper GI, upper endoscopy, and CT imaging no identifiable source can be found to explain her epigastric discomfort and fullness sensation with oral intake.  Therefore it was recommended that she be brought to the operating room for diagnostic laparoscopy as well as placement of a gastric tube and her excluded stomach for supplemental enteral feeds to meet her nutritional requirements.  Hospital Course:  She was brought to the operating room on July 24.  I ended up resecting a small portion of the blind end of roux limb (8cm) and placing a 24 French gastrotomy tube and her excluded stomach.  Unfortunately on postop day 2-3 her gastric tube became dislodged.  She was sent to interventional radiology and they were unable to re-advance the gastric tube and her excluded stomach.  However they were able to cannulate the tract and place a new 10 French feeding tube into her excluded stomach.  She was allowed to eat by mouth as tolerated.  She was at times able to take in more by mouth then she had at any time postoperatively.  At one point she got into protein shakes in 24 hours.  We started G-tube teaching & G-tube feeds. We initially tried bolus gastrotomy tube feedings however she also complained of a full sensation with that.  So we then tried nighttime cyclic tube feedings.  She tolerated 40 cc/h however when her tube feed rate was advanced to 60 cc per hour she developed left upper quadrant pain and fullness.  Therefore we decided that she would just do 40 cc an hour for about 8 hours. At times the patient was emotional and chaplain services visited the patient.  They have  arranged and recommended outpatient counseling which I agree with.  I ended up holding her propranolol for her intracranial idiopathic hypertension on numerous occasions just because I don't think her blood pressure will tolerate it.  The plan will be for her to go home with supplemental enteral gastric tube feedings as needed for poor oral  intake.  If she has a bad day with oral intake then she needs to receive tube feeds that night or for at least 8 hours continuously.  If she has a good day with oral intake that she does not discern need to have tube feeds at night.  We will monitor her nutritional status as an outpatient.   Discharge Instructions  Discharge Instructions    AMB Referral to [place] [facility]    Complete by:  As directed   Please assign UMR member for post toc when discharged from [facility]. Thanks. [place], RN,BSN [facility] [id number]   Reason for consult:  Please assign UMR member for toc when discharged. Currently at [facility]   Expected date of contact:  1-3 days (reserved for hospital discharges)   Ambulate hourly while awake    Complete by:  As directed   Call MD for:  difficulty breathing, headache or visual disturbances    Complete by:  As directed   Call MD for:  persistant dizziness or light-headedness    Complete by:  As directed   Call MD for:  persistant nausea and vomiting    Complete by:  As directed   Call MD for:  redness, tenderness, or signs of infection (pain, swelling, redness, odor or green/yellow discharge around incision site)    Complete by:  As directed   Call MD for:  severe uncontrolled pain    Complete by:  As directed   Call MD for:  temperature >101 F    Complete by:  As directed   Diet bariatric full liquid    Complete by:  As directed   Discharge instructions    Complete by:  As directed   See bariatric discharge instructions   Incentive spirometry    Complete by:  As directed   Perform hourly while awake       Medication List    STOP taking these medications   ciprofloxacin 500 MG tablet Commonly known as:  CIPRO   propranolol 10 MG tablet Commonly known as:  INDERAL   thiamine 123XX123 mg, folic acid 1 mg, multivitamins adult 10 mL in sodium chloride 0.9 % 1,000 mL     TAKE these medications   ALPRAZolam 0.5 MG  tablet Commonly known as:  XANAX Take 1 tablet (0.5 mg total) by mouth 2 (two) times daily as needed for anxiety. Can take 1-2 30 minutes before MRI. Do not drive.   buPROPion 300 MG 24 hr tablet Commonly known as:  WELLBUTRIN XL Take 1 tablet (300 mg total) by mouth daily.   CALCIUM-VITAMIN D PO Take 15 mLs by mouth 3 (three) times daily.   feeding supplement (VITAL HIGH PROTEIN) Liqd liquid Place 1,000 mLs into feeding tube daily. 104ml/hr for 8hrs a day as needed for poor oral intake; 300 ml free water q4hr   ondansetron 4 MG disintegrating tablet Commonly known as:  ZOFRAN-ODT Take 1 tablet (4 mg total) by mouth every 6 (six) hours as needed for nausea.   oxyCODONE 5 MG/5ML solution Commonly known as:  ROXICODONE Take 2.5 mLs (2.5 mg total) by mouth every 6 (six) hours  as needed for moderate pain or severe pain. What changed:  when to take this  reasons to take this   pantoprazole 40 MG tablet Commonly known as:  PROTONIX Take 40 mg by mouth daily.   polyethylene glycol packet Commonly known as:  MIRALAX / GLYCOLAX Take 17 g by mouth daily.   SUMAtriptan 20 MG/ACT nasal spray Commonly known as:  IMITREX Place 1 spray (20 mg total) into the nose once. May repeat in 2 hours if headache persists or recurs. What changed:  when to take this  reasons to take this  additional instructions   topiramate 100 MG tablet Commonly known as:  TOPAMAX Take 2 tablets (200 mg total) by mouth at bedtime. What changed:  how much to take  when to take this   VITAMIN B 12 PO Take 1 tablet by mouth daily.      Follow-up [place], MD .   Specialty:  General Surgery Contact information: [address] [place] [address] [phone]            The results of significant diagnostics from this hospitalization (including imaging, microbiology, ancillary and laboratory) are listed below for reference.    Significant Diagnostic  Studies: Ct Abdomen Wo Contrast  Result Date: 10/03/2015 CLINICAL DATA:  Postop day 2 from laparoscopic revision of gastric bypass roux limb. Laparoscopic placement of gastrostomy tube and excluded stomach remnant. Pain around G-tube site. EXAM: CT ABDOMEN WITHOUT CONTRAST TECHNIQUE: Multidetector CT imaging of the abdomen was performed following the standard protocol without IV contrast. COMPARISON:  08/06/2015 and 03/21/2013 FINDINGS: Lower chest: Possible tiny amount left pleural fluid with mild bibasilar atelectasis. Hepatobiliary: Previous cholecystectomy. The liver and biliary tree are within normal. Pancreas: No mass or inflammatory process identified on this un-enhanced exam. Spleen: Within normal limits in size. Adrenals/Urinary Tract: No evidence of urolithiasis or hydronephrosis. No definite mass visualized on this un-enhanced exam. Stomach/Bowel: Postsurgical change compatible previous gastric bypass procedure. Evidence of patient's percutaneous gastrostomy tube which abuts and appears to buckle the anterior wall of the excluded gastric segment inward. Tip of the tube likely not within the lumen of the gastric remnant. Balloon of the gastrostomy tube is not inflated as the tip of the tube is centered over the junction of the abdominal wall musculature with the anterior wall of the gastric remnant and likely not within the lumen of the remnant. This could be advanced approximately 1.5 cm. There is a surgical suture line over a loop of small bowel in the left mid abdomen. Small bowel is otherwise within normal. Moderate fecal retention of the right colon as the colon is otherwise within normal. Vascular/Lymphatic: No pathologically enlarged lymph nodes. No evidence of abdominal aortic aneurysm. Other: None. Musculoskeletal:  No suspicious bone lesions identified. IMPRESSION: Evidence of patient's previous gastric bypass procedure. Percutaneous gastrostomy tube tip appears to buckle the adjacent gastric  remnant anterior wall inward and not be within the lumen of the gastric remnant. Balloon is not inflated. The tip is centered over the junction of the abdominal wall musculature with the anterior wall of the gastric remnant and could be advanced approximately 1.5 cm. Tiny amount of left pleural fluid bibasilar dependent atelectasis. Electronically Signed   By: Marin Olp M.D.   On: 10/03/2015 11:36  Dg Chest 2 View  Result Date: 09/11/2015 CLINICAL DATA:  Fever, headaches EXAM: CHEST  2 VIEW COMPARISON:  None. FINDINGS: Right-sided PICC line with the tip projecting over  the SVC. There is no focal parenchymal opacity. There is no pleural effusion or pneumothorax. The heart and mediastinal contours are unremarkable. The osseous structures are unremarkable. IMPRESSION: No active cardiopulmonary disease. Electronically Signed   By: Kathreen Devoid   On: 09/11/2015 08:00   Ir Replc Gastro/colonic Tube Percut W/fluoro  Result Date: 10/04/2015 INDICATION: 45 year old with 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 Laparoscopic placement of 24 French gastrostomy tube on 09/30/2015. The gastrostomy tube is not functioning well and concern for malpositioning or dislodgement based on recent CT. EXAM: GASTROSTOMY CATHETER REPLACEMENT MEDICATIONS: See anesthesia/sedation ANESTHESIA/SEDATION: Versed 7.0 mg IV; Fentanyl 200 mcg IV Moderate Sedation Time:  45 minutes The patient was continuously monitored during the procedure by the interventional radiology nurse under my direct supervision. CONTRAST:  33mL ISOVUE-300 IOPAMIDOL (ISOVUE-300) INJECTION 61% - administered into the gastric lumen. FLUOROSCOPY TIME:  Fluoroscopy Time: 14 minutes 54 seconds (323 mGy). COMPLICATIONS: None immediate. PROCEDURE: Informed written consent was obtained from the patient after a thorough discussion of the procedural risks, benefits and alternatives. All questions were addressed. Maximal Sterile  Barrier Technique was utilized including caps, mask, sterile gowns, sterile gloves, sterile drape, hand hygiene and skin antiseptic. A timeout was performed prior to the initiation of the procedure. The existing G-tube was prepped and draped in sterile fashion. Contrast would not easily injected through the catheter. A 5 French catheter and Glidewire were advanced through the 24 [place] catheter. Wire advanced through the tube but it was coiling around the catheter. Advancement of the wire was also causing the patient a lot of pain. The 24 French tube was completely removed. Using fluoroscopic guidance, 5 French catheter was eventually advanced into the gastric lumen with a Glidewire. A stiff Amplatz wire was placed. The tract was dilated up to 18 [place]. A 16 French balloon retention gastrostomy tube was successfully advanced over the wire. Contrast injection confirmed placement in the stomach. The balloon was inflated with 5 mL of saline. FINDINGS: The 69 French catheter was not within the gastric lumen. 5 French catheter was successfully advanced into the gastric lumen and 16 [place] G-tube was placed. Contrast injection confirmed placement in the stomach. IMPRESSION: Successful replacement of the gastrostomy tube. The patient now has a 35 [place] balloon retention gastrostomy tube. Electronically Signed   By: Markus Daft M.D.   On: 10/04/2015 18:45   Microbiology: No results found for this or any previous visit (from the past 240 hour(s)).   Labs: Basic Metabolic Panel:  Recent Labs Lab 10/04/15 0400 10/05/15 0520  NA 135 136  K 4.5 4.4  CL 106 106  CO2 25 25  GLUCOSE 79 88  BUN 10 9  CREATININE 1.02* 0.97  CALCIUM 8.3* 8.5*  MG 1.6*  --    Liver Function Tests: No results for input(s): AST, ALT, ALKPHOS, BILITOT, PROT, ALBUMIN in the last 168 hours. No results for input(s): LIPASE, AMYLASE in the last 168 hours. No results for input(s): AMMONIA in the last 168 hours. CBC:  Recent  Labs Lab 10/05/15 0520 10/06/15 0510  WBC 8.5 8.8  NEUTROABS  --  4.8  HGB 11.1* 11.3*  HCT 33.3* 34.7*  MCV 92.8 93.5  PLT 269 258   Cardiac Enzymes: No results for input(s): CKTOTAL, CKMB, CKMBINDEX, TROPONINI in the last 168 hours. BNP: BNP (last 3 results) No results for input(s): BNP in the last 8760 hours.  ProBNP (last 3 results) No results for input(s): PROBNP in the last 8760  hours.  CBG:  Recent Labs Lab 10/07/15 1355  GLUCAP 104*    Active Problems:   Epigastric pain   Mild protein-calorie malnutrition (HCC)   Protein-calorie malnutrition, severe   Time coordinating discharge: 30 minutes  Signed:  [name], MD [facility] Surgery, [place] [phone] 10/10/2015, 8:30 AM

## 2015-10-10 NOTE — Telephone Encounter (Signed)
Dr Ahern- please advise 

## 2015-10-10 NOTE — Telephone Encounter (Signed)
Patient requesting sublingual Imitrex instead of the nasal. She uses [facility]. Her best call back is  [phone]

## 2015-10-11 ENCOUNTER — Other Ambulatory Visit: Payer: Self-pay | Admitting: *Deleted

## 2015-10-11 ENCOUNTER — Other Ambulatory Visit: Payer: Self-pay | Admitting: Neurology

## 2015-10-11 DIAGNOSIS — E441 Mild protein-calorie malnutrition: Secondary | ICD-10-CM | POA: Diagnosis not present

## 2015-10-11 DIAGNOSIS — G8918 Other acute postprocedural pain: Secondary | ICD-10-CM | POA: Diagnosis not present

## 2015-10-11 DIAGNOSIS — K9589 Other complications of other bariatric procedure: Secondary | ICD-10-CM | POA: Diagnosis not present

## 2015-10-11 MED ORDER — RIZATRIPTAN BENZOATE 10 MG PO TBDP: 10.0000 mg | ORAL_TABLET | Freq: Once | ORAL | 11 refills | Status: DC

## 2015-10-11 MED FILL — RIZATRIPTAN 10 MG ODT: 10 | 30 days supply | Qty: 9 | Fill #0

## 2015-10-11 NOTE — Telephone Encounter (Signed)
Done. thanks

## 2015-10-11 NOTE — Telephone Encounter (Signed)
Margaret Fletcher, I'm not aware that imitre has sublingual pills. Imitrex has pills that you swallow. Did she mean Maxalt which does have sublingual melting tabs? thanks

## 2015-10-11 NOTE — Telephone Encounter (Signed)
Dr Jaynee Eagles- pt requesting you call in maxalt sublingual instead. Thank you. Pt aware you are not in office today.   Called pt back. She just got home from the hospital a couple days ago. She has some headaches over the past couple days.She has feeding tube in place d/t surgery. She would rather have sublingual tab. She feels the tablet was ineffective. She was thinking about the maxalt and not the imitrex. Advised I will have Dr Jaynee Eagles call into pharmacy. If she does not, I will call back and let her know. She verbalized understanding.

## 2015-10-11 NOTE — Patient Outreach (Addendum)
[facility]) Care Management  10/11/2015  Margaret Fletcher September 17, 1970 [id number]   Subjective:  Telephone call to patient's home number/ mobile times 2, spoke with patient, and HIPAA verified. Patient states she is feeling better than yesterday, was able to get some rest last night.  States she continues to have headaches due to the brain ( intracranial) issues. Patient gave verbal authorization to speak with significant other (Margaret Fletcher) and mother Margaret Fletcher) regarding her healthcare as needs as needed. Discussed [facility] Care Management services and UMR transition of care follow up.  Patient voices understanding,  is in agreement to complete follow up, and continue to receive transition of care calls.  Patient states will call MD's office next week to schedule hospital follow up appointments.   Patient voices understanding of the importance of MD follow up appointments.  RNCM offered to make appointment for patient if needed.   Patient states she will make appointment and let RNCM know if she needs assistance with making appointment.  Patient states she has exhausted family medical leave act and is currently out of work under medical leave.  States she has not worked since April of 2017.  States she received a call yesterday regarding long term disability and is planning to follow up with benefits department.  States she currently has a hospital indemnity supplemental plan and has been utilizing her benefits.  States she is planning to follow up with counselor through the [employer] and will call for an appointment next week.  States she is not currently taking Wellbutrin and MD is aware.    Patient in agreement to receive EMMI information regarding  "Coping With A Health Condition: Signs Of Depression. " Patient states she does not have any disease monitoring, disease education, transportation, or pharmacy needs at this time.   Patient will continue to  receive [facility] Transition of care follow up calls.    Objective: Per chart review: Patient has a history of Prediabetes, status post gastric bypass (hospitalized 07/08/15 - 07/10/15), autoimmune disorder, total parenteral nutrition (TPN), alopecia,depression, and Protein-calorie malnutrition. Her postop course was complicated by being readmitted on May 8 through the 12 for difficulty with oral intake.  Received IV fluids 3 times per week via PICC line.  Readmitted  08/16/15 - 08/19/15 with epigastric pain, sent home with TPN.  Treated in ED 08/21/15 hyperkalemia.  Has continued to have increased headaches. Readmitted 09/11/15 -09/14/15 bacteremia with PICC removal.   Readmitted 09/30/15 -10/10/15 epigastric pain, moderate protein malnutrition, s/p revision Roux limb, G-tube placement on 09/30/15.   Assessment: Received referral on 10/03/15 and patient still inpatient upon initial review. Telephone screen and transition of care follow up completed.   Patient with multiple readmission, RNCM will continue to follow for transition of care.  Plan: RNCM will send patient successful outreach letter, [facility] pamphlet, magnet, and EMMI "Coping With A Health Condition: Signs Of Depression. " handout .  RNCM will send case activation  request to [name]. Late entry:  RNCM will call patient for telephone outreach within 10 business days and will continue to follow for transition of care.   Joseh Sjogren H. Annia Friendly, BSN, [facility] Telephonic CM Phone: [phone] Fax: [phone]

## 2015-10-12 ENCOUNTER — Encounter: Payer: Self-pay | Admitting: *Deleted

## 2015-10-13 DIAGNOSIS — G8918 Other acute postprocedural pain: Secondary | ICD-10-CM | POA: Diagnosis not present

## 2015-10-13 DIAGNOSIS — E441 Mild protein-calorie malnutrition: Secondary | ICD-10-CM | POA: Diagnosis not present

## 2015-10-13 DIAGNOSIS — K9589 Other complications of other bariatric procedure: Secondary | ICD-10-CM | POA: Diagnosis not present

## 2015-10-15 ENCOUNTER — Other Ambulatory Visit: Payer: Self-pay | Admitting: *Deleted

## 2015-10-15 DIAGNOSIS — G8918 Other acute postprocedural pain: Secondary | ICD-10-CM | POA: Diagnosis not present

## 2015-10-15 DIAGNOSIS — K9589 Other complications of other bariatric procedure: Secondary | ICD-10-CM | POA: Diagnosis not present

## 2015-10-15 DIAGNOSIS — E441 Mild protein-calorie malnutrition: Secondary | ICD-10-CM | POA: Diagnosis not present

## 2015-10-15 NOTE — Patient Outreach (Addendum)
[name]) Care Management  10/15/2015  Margaret Fletcher 1971/03/09 [id number]   Case Reviewed  in Difficult Case Discussion.   45 y/o female, PMH: protein calorie malnutrition,  morbid obesity, hypertrophy of breast, autoimmune disorder,migraines, prediabetes, obstructive sleep apnea, depression, GERD, and intracranial hypertension.  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  s/p Bilateral Reduction Mammoplasty  03/26/15.    5 inpatient admits since January 2017, no SNF admits.  Her postop course was complicated by being readmitted on May 8 through the 12 for difficulty with oral intake.  Received IV fluids 3 x's per week via PICC line.  Readmitted  08/16/15 - 08/19/15 with epigastric pain, sent home with TPN.  Treated in ED 08/21/15 hyperkalemia.  Has continued to have increased headaches. Readmitted 09/11/15 -09/14/15 bacteremia with PICC removal.   Readmitted 09/30/15 -10/10/15 epigastric pain, moderate protein malnutrition, s/p revision Roux limb, G-tube placement on 09/30/15.   Had bilateral breast reduction  January 2017 due to increase symptoms. Diagnosed with idopathic intracranial hypertension  March 2017, being followed by neurology. Resumed Biatriac surgery pursuit  due to intracranial hypertension new diagnosis and morbid obesity .  Gastric bypass surgery May of 2017 with multiple post surgery complications leading to most recent hospital discharge on 10/10/15.   Discharged from inpatient admit on 10/10/15. Is currently on medical leave and has exhausted FMLA.  History of moving unexpectly in April 2017 and experiencing financial hardship.  TOC completed on 09/18/15.   2nd TOC contact completed on 10/11/15.    Plan: RNCM will continue to follow for transition of care, will call patient for telephone outreach within 10 business days.  RNCM  will follow up on the status of primary MD appointment, gastrostomy feeding tube utilization, and long term disability, within 10 business days.    [name] H. Annia Friendly, BSN, [place] Management [facility] Telephonic CM Phone: [phone] Fax: [phone]

## 2015-10-16 MED FILL — LACTULOSE 10 GM/15 ML SOLN: 10 | 2 days supply | Qty: 60 | Fill #0

## 2015-10-17 DIAGNOSIS — E441 Mild protein-calorie malnutrition: Secondary | ICD-10-CM | POA: Diagnosis not present

## 2015-10-17 DIAGNOSIS — N9089 Other specified noninflammatory disorders of vulva and perineum: Secondary | ICD-10-CM | POA: Diagnosis not present

## 2015-10-17 DIAGNOSIS — K5900 Constipation, unspecified: Secondary | ICD-10-CM | POA: Diagnosis not present

## 2015-10-17 DIAGNOSIS — E43 Unspecified severe protein-calorie malnutrition: Secondary | ICD-10-CM | POA: Diagnosis not present

## 2015-10-18 ENCOUNTER — Other Ambulatory Visit: Payer: Self-pay | Admitting: *Deleted

## 2015-10-18 ENCOUNTER — Ambulatory Visit: Payer: Self-pay | Admitting: *Deleted

## 2015-10-18 MED FILL — KENALOG-40 40 MG/ML VIAL: 40 | 1 days supply | Qty: 5 | Fill #0

## 2015-10-18 NOTE — Patient Outreach (Addendum)
[place] [facility]) Care Management  10/18/2015  KOLBI LANING [birth year] [id number]   Subjective: Telephone call to patient's home number, spoke with patient, and HIPAA verified.   Patient states she is doing ok and has had a busy week.  States she had a MD appointment today at the surgeon's office with Dr. Gurney Maxin to adjust her gastrostomy tube.  States the adjustment has caused pain, nausea, and she is managing these symptoms with medications.  States she is able to manage her gastrostomy tube independently and has had 3 supplemental feedings since she came home on 10/10/15.   Patient states she is able to tolerate some liquids and foods by mouth.  Patient states she also had an appointment with her Gyn MD on 10/17/15 and it went well.  States she has an appointment with eye MD on 10/21/15 to follow up on intracranial hypertension.   States she is still receiving weekly visits from home health nurse and the next visit is on 10/22/15.   Patient states she has not had a chance to schedule an appointment with primary MD.   States her primary MD's office is also an urgent care facility, it is physically taxing for her to sit in the MD's office and wait for an appointment for a long period of time.   Patient declined RNCM's assistance to facilitate MD appointment and states she will attempt to go in for an appointment next week.  Patient will notify RNCM if assistance needed to schedule primary MD hospital follow up appointment.  Patient states she has not heard anything from the long term disability and will follow uto tith [place] benefits line.  States she has not received the EMMI  Information "Coping With A Health Condition: Signs Of Depression. " and will let RNCM know if she does not receive it, on next outreach.   Patient will continue to receive [place] [facility] Transition of care follow up calls.  Objective: Per chart review: Patient has a history of Prediabetes, status  post gastric bypass (hospitalized 07/08/15 - 07/10/15), autoimmune disorder, total parenteral nutrition (TPN), alopecia,depression, and Protein-calorie malnutrition. Her postop course was complicated by being readmitted on May 8 through the 12 for difficulty with oral intake.  Received IV fluids 3 times per week via PICC line.  Readmitted  08/16/15 - 08/19/15 with epigastric pain, sent home with TPN.  Treated in ED 08/21/15 hyperkalemia.  Has continued to have increased headaches. Readmitted 09/11/15 -09/14/15 bacteremia with PICC removal.   Readmitted 09/30/15 -10/10/15 epigastric pain, moderate protein malnutrition, s/p revision Roux limb, G-tube placement on 09/30/15.   Assessment: Received referral on 10/03/15 and patient still inpatient upon initial review. Telephone screen and transition of care follow up completed.  Patient with multiple readmission, RNCM will continue to follow for transition of care.  Plan:RNCM will call patient for telephone outreach within 10 business days and will continue to follow for transition of care.  RNCM will verify receipt of and answer any questions, regarding patient successful outreach letter, [facility] pamphlet, magnet, and EMMI "Coping With A Health Condition: Signs Of Depression. " handout, within 10 business days.  RNCM will follow up with patient within 10 business days to verify outcome of eye appointment, home health RN visit, and status of primary MD follow up appointment.      Panayiota Larkin H. Annia Friendly, BSN, [place] Management [facility] Telephonic CM Phone: [phone] Fax: [phone]

## 2015-10-18 NOTE — Patient Outreach (Addendum)
[place] [facility]) Care Fletcher  10/18/2015  Margaret Fletcher June 23, 1970 [id number]  Subjective:   Telephone call from patient and HIPAA verified.  States she just received new scales from [employer] [facility] and states it is not needed because she just recently purchased new scales.  RNCM advised will follow up on equipment return policy and give patient update on next patient outreach. Patient will continue to receive [place] [facility] services.  Objective: Per chart review: Patient has a history of Prediabetes, status post gastric bypass (hospitalized 07/08/15 - 07/10/15), autoimmune disorder, total parenteral nutrition (TPN), alopecia,depression, and Protein-calorie malnutrition. Her postop course was complicated by being readmitted on May 8 through the 12 for difficulty with oral intake. Received IV fluids 3 timesper week via PICC line. Readmitted 08/16/15 - 08/19/15 with epigastric pain, sent home with TPN. Treated in ED 08/21/15 hyperkalemia. Has continued to have increased headaches. Readmitted 09/11/15 -09/14/15 bacteremia with PICC removal. Readmitted 09/30/15 -10/10/15 epigastric pain, moderate protein malnutrition, s/p revision Roux limb, G-tube placement on 09/30/15.   Assessment: Received referral on 10/03/15 and patient still inpatient upon initial review. Telephone screen and transition of care follow up completed. Patient with multiple readmission, RNCM will continue to follow for transition of care.  Plan:RNCM will call patient for telephone outreach within 10 business days and will continue to follow for transition of care.  RNCM will verify receipt of and answer any questions, regarding patient successful outreach letter, [facility] pamphlet, magnet, and EMMI "Coping With A Health Condition: Signs Of Depression. "handout, within 10 business days.  RNCM will follow up with patient within 10 business days to verify outcome of eye appointment, home health RN visit, and  status of primary MD follow up appointment.    RNCM will follow up Margaret Fletcher equipment return policy and advise patient of update, within 10 business days.    Margaret Fletcher H. Annia Friendly, BSN, [place] [facility] Telephonic CM Phone: [phone] Fax: [phone]

## 2015-10-19 DIAGNOSIS — G8918 Other acute postprocedural pain: Secondary | ICD-10-CM | POA: Diagnosis not present

## 2015-10-19 DIAGNOSIS — E441 Mild protein-calorie malnutrition: Secondary | ICD-10-CM | POA: Diagnosis not present

## 2015-10-19 DIAGNOSIS — K9589 Other complications of other bariatric procedure: Secondary | ICD-10-CM | POA: Diagnosis not present

## 2015-10-22 DIAGNOSIS — E441 Mild protein-calorie malnutrition: Secondary | ICD-10-CM | POA: Diagnosis not present

## 2015-10-22 DIAGNOSIS — K9589 Other complications of other bariatric procedure: Secondary | ICD-10-CM | POA: Diagnosis not present

## 2015-10-22 DIAGNOSIS — G8918 Other acute postprocedural pain: Secondary | ICD-10-CM | POA: Diagnosis not present

## 2015-10-23 ENCOUNTER — Encounter (HOSPITAL_COMMUNITY): Payer: Self-pay | Admitting: Emergency Medicine

## 2015-10-23 ENCOUNTER — Emergency Department (HOSPITAL_COMMUNITY): Payer: 59

## 2015-10-23 ENCOUNTER — Emergency Department (HOSPITAL_COMMUNITY)
Admission: EM | Admit: 2015-10-23 | Discharge: 2015-10-24 | Disposition: A | Discharge status: Home / Self Care | Payer: 59 | Attending: Emergency Medicine | Admitting: Emergency Medicine

## 2015-10-23 DIAGNOSIS — Z4682 Encounter for fitting and adjustment of non-vascular catheter: Secondary | ICD-10-CM | POA: Diagnosis not present

## 2015-10-23 DIAGNOSIS — R110 Nausea: Secondary | ICD-10-CM | POA: Diagnosis not present

## 2015-10-23 DIAGNOSIS — Z79899 Other long term (current) drug therapy: Secondary | ICD-10-CM | POA: Insufficient documentation

## 2015-10-23 DIAGNOSIS — G8918 Other acute postprocedural pain: Secondary | ICD-10-CM | POA: Insufficient documentation

## 2015-10-23 DIAGNOSIS — R109 Unspecified abdominal pain: Secondary | ICD-10-CM | POA: Diagnosis present

## 2015-10-23 LAB — CBC WITH DIFFERENTIAL/PLATELET
BASOS ABS: 0.1 10*3/uL (ref 0.0–0.1)
BASOS PCT: 1 %
Eosinophils Absolute: 0.4 10*3/uL (ref 0.0–0.7)
Eosinophils Relative: 4 %
HEMATOCRIT: 37.1 % (ref 36.0–46.0)
HEMOGLOBIN: 12.2 g/dL (ref 12.0–15.0)
LYMPHS PCT: 29 %
Lymphs Abs: 2.8 10*3/uL (ref 0.7–4.0)
MCH: 30.5 pg (ref 26.0–34.0)
MCHC: 32.9 g/dL (ref 30.0–36.0)
MCV: 92.8 fL (ref 78.0–100.0)
MONOS PCT: 8 %
Monocytes Absolute: 0.8 10*3/uL (ref 0.1–1.0)
NEUTROS ABS: 5.7 10*3/uL (ref 1.7–7.7)
NEUTROS PCT: 58 %
Platelets: 341 10*3/uL (ref 150–400)
RBC: 4 MIL/uL (ref 3.87–5.11)
RDW: 13.3 % (ref 11.5–15.5)
WBC: 9.7 10*3/uL (ref 4.0–10.5)

## 2015-10-23 LAB — COMPREHENSIVE METABOLIC PANEL
ALBUMIN: 3.9 g/dL (ref 3.5–5.0)
ALT: 17 U/L (ref 14–54)
ANION GAP: 5 (ref 5–15)
AST: 21 U/L (ref 15–41)
Alkaline Phosphatase: 56 U/L (ref 38–126)
BUN: 20 mg/dL (ref 6–20)
CHLORIDE: 108 mmol/L (ref 101–111)
CO2: 25 mmol/L (ref 22–32)
Calcium: 9 mg/dL (ref 8.9–10.3)
Creatinine, Ser: 1.09 mg/dL — ABNORMAL HIGH (ref 0.44–1.00)
GFR calc Af Amer: >60 mL/min (ref >60)
GFR calc non Af Amer: >60 mL/min (ref >60)
GLUCOSE: 86 mg/dL (ref 65–99)
POTASSIUM: 3.7 mmol/L (ref 3.5–5.1)
SODIUM: 138 mmol/L (ref 135–145)
Total Bilirubin: 0.4 mg/dL (ref 0.3–1.2)
Total Protein: 7.7 g/dL (ref 6.5–8.1)

## 2015-10-23 MED ORDER — ONDANSETRON HCL 4 MG/2ML IJ SOLN
4.0000 mg | Freq: Once | INTRAMUSCULAR | Status: AC
  Administered 2015-10-23: 4 mg via INTRAVENOUS
  Filled 2015-10-23: qty 2

## 2015-10-23 MED ORDER — SODIUM CHLORIDE 0.9 % IV BOLUS (SEPSIS)
500.0000 mL | Freq: Once | INTRAVENOUS | Status: AC
  Administered 2015-10-23: 500 mL via INTRAVENOUS

## 2015-10-23 MED ORDER — HYDROMORPHONE HCL 1 MG/ML IJ SOLN
0.5000 mg | Freq: Once | INTRAMUSCULAR | Status: AC
  Administered 2015-10-24: 0.5 mg via INTRAVENOUS
  Filled 2015-10-23: qty 1

## 2015-10-23 MED ORDER — IOPAMIDOL (ISOVUE-300) INJECTION 61%
100.0000 mL | Freq: Once | INTRAVENOUS | Status: AC | PRN
  Administered 2015-10-23: 100 mL via INTRAVENOUS

## 2015-10-23 MED ORDER — HYDROMORPHONE HCL 1 MG/ML IJ SOLN
1.0000 mg | Freq: Once | INTRAMUSCULAR | Status: AC
  Administered 2015-10-23: 1 mg via INTRAVENOUS
  Filled 2015-10-23: qty 1

## 2015-10-23 NOTE — ED Provider Notes (Signed)
[facility] DEPT Provider Note   CSN: [id number] Arrival date & time: 10/23/15  1911  By signing my name below, I, Higinio Plan, attest that this documentation has been prepared under the direction and in the presence of non-physician practitioner, Charlann Lange, PA-C. Electronically Signed: Higinio Plan, Scribe. 10/23/2015. 9:23 PM.  History   Chief Complaint Chief Complaint  Patient presents with  . Feeding tube problem   The history is provided by the patient. No language interpreter was used.     HPI Comments: Margaret Fletcher is a 45 y.o. female who presents to the Emergency Department complaining of gradually worsening pain at the site of feeding tube placement that began 5 days ago and worsened this morning. Pt reports PSHx of a roux-en-Y on 7/24 during which she had a feeding tube placed. She reports she was seen in her surgeon's office 4 days ago after experiencing pain around the site in which he manipulated her tube; she states her tube has been "sucking in and out and moving around" since then causing pain. She notes associated nausea, swelling around the site of her tube and pain that radiates to the left upper abdomen and her lower back. Pt states she has taken oxycodone with no relief. She denies fever. No urinary symptoms.  Past Medical History:  Diagnosis Date  . Alopecia   . Anxiety   . Autoimmune disease ([place])    "Alopecia"  . Bacterial vaginosis   . Cyst of spleen   . Depression   . Depression   . Frequent UTI   . Hypertrophy of breast   . IIH (idiopathic intracranial hypertension) 2017   "related to headaches"  . Migraine   . Migraine headache    denies, ruled out - Propranolol.Using for headaches    Patient Active Problem List   Diagnosis Date Noted  . Protein-calorie malnutrition, severe 10/08/2015  . Mild protein-calorie malnutrition ([place]) 10/07/2015  . Bacteremia 09/12/2015  . Fever 09/11/2015  . Protein-calorie malnutrition, mild ([place]) 08/19/2015    . Epigastric pain 08/16/2015  . Dehydration 07/15/2015  . Prediabetes 07/08/2015  . Morbid obesity ([place]) 07/08/2015  . Mild obstructive sleep apnea 07/08/2015  . Dyslipidemia 07/08/2015  . S/P gastric bypass 07/08/2015  . IIH (idiopathic intracranial hypertension) 06/03/2015  . Chronic migraine without aura or status migrainosus 04/30/2015  . Worsening headaches 04/30/2015  . Vision changes 04/30/2015  . Perceived hearing changes 04/30/2015  . Liver lesion 03/21/2013  . Splenic cyst 03/21/2013  . Abdominal pain 03/21/2013    Past Surgical History:  Procedure Laterality Date  . APPENDECTOMY    . BREAST REDUCTION SURGERY Bilateral 03/26/2015   Procedure: BILATERAL BREAST REDUCTION  ;  Surgeon: Youlanda Roys, MD;  Location: [facility];  Service: Plastics;  Laterality: Bilateral;  . BREATH TEK H PYLORI N/A 05/21/2014   Procedure: BREATH TEK H PYLORI;  Surgeon: Greer Pickerel, MD;  Location: [facility];  Service: General;  Laterality: N/A;  . CESAREAN SECTION    . CESAREAN SECTION    . CHOLECYSTECTOMY    . DILITATION & CURRETTAGE/HYSTROSCOPY WITH NOVASURE ABLATION N/A 07/19/2012   Procedure: HYSTEROSCOPY WITH NOVASURE ABLATION;  Surgeon: Farrel Gobble. Harrington Challenger, MD;  Location: [facility];  Service: Gynecology;  Laterality: N/A;  . ENDOMETRIAL ABLATION    . ESOPHAGOGASTRODUODENOSCOPY N/A 08/16/2015   Procedure: UPPER ESOPHAGOGASTRODUODENOSCOPY (EGD);  Surgeon: Greer Pickerel, MD;  Location: [facility];  Service: General;  Laterality: N/A;  . IR GENERIC HISTORICAL  10/04/2015   IR  [place] GASTRO/COLONIC TUBE PERCUT W/FLUORO 10/04/2015 Markus Daft, MD [facility]-INTERV RAD  . LAPAROSCOPIC GASTROSTOMY N/A 09/30/2015   Procedure: LAPAROSCOPIC GASTROSTOMY TUBE PLACEMENT;  Surgeon: Greer Pickerel, MD;  Location: [facility];  Service: General;  Laterality: N/A;  . LAPAROSCOPIC ROUX-EN-Y GASTRIC BYPASS WITH HIATAL HERNIA REPAIR N/A 07/08/2015   Procedure: LAPAROSCOPIC ROUX-EN-Y GASTRIC BYPASS WITH HIATAL HERNIA  REPAIR WITH UPPER ENDOSCOPY;  Surgeon: Greer Pickerel, MD;  Location: [facility];  Service: General;  Laterality: N/A;  . LAPAROSCOPIC SMALL BOWEL RESECTION N/A 09/30/2015   Procedure: LAPAROSCOPIC REVISION OF ROUX LIMB;  Surgeon: Greer Pickerel, MD;  Location: [facility];  Service: General;  Laterality: N/A;  . LAPAROSCOPY N/A 09/30/2015   Procedure: LAPAROSCOPY DIAGNOSTIC;  Surgeon: Greer Pickerel, MD;  Location: [facility];  Service: General;  Laterality: N/A;  . TUBAL LIGATION    . WISDOM TOOTH EXTRACTION      OB History    No data available       Home Medications    Prior to Admission medications   Medication Sig Start Date End Date Taking? Authorizing Provider  ALPRAZolam Duanne Moron) 0.5 MG tablet Take 1 tablet (0.5 mg total) by mouth 2 (two) times daily as needed for anxiety. Can take 1-2 30 minutes before MRI. Do not drive. 07/04/15   Robyn Haber, MD  buPROPion (WELLBUTRIN XL) 300 MG 24 hr tablet Take 1 tablet (300 mg total) by mouth daily. Patient not taking: Reported on 10/11/2015 07/04/15   Robyn Haber, MD  CALCIUM-VITAMIN D PO Take 15 mLs by mouth 3 (three) times daily.    Historical Provider, MD  Cyanocobalamin (VITAMIN B 12 PO) Take 1 tablet by mouth daily.    Historical Provider, MD  Nutritional Supplements (FEEDING SUPPLEMENT, VITAL HIGH PROTEIN,) LIQD liquid Place 1,000 mLs into feeding tube daily. 48ml/hr for 8hrs a day as needed for poor oral intake; 300 ml free water q4hr 10/09/15   Greer Pickerel, MD  ondansetron (ZOFRAN-ODT) 4 MG disintegrating tablet Take 1 tablet (4 mg total) by mouth every 6 (six) hours as needed for nausea. 07/19/15   Greer Pickerel, MD  oxyCODONE (ROXICODONE) 5 MG/5ML solution Take 2.5 mLs (2.5 mg total) by mouth every 6 (six) hours as needed for moderate pain or severe pain. 10/07/15   Greer Pickerel, MD  pantoprazole (PROTONIX) 40 MG tablet Take 40 mg by mouth daily. 07/19/15   Historical Provider, MD  polyethylene glycol (MIRALAX / GLYCOLAX) packet Take 17 g by mouth  daily. Patient not taking: Reported on 10/11/2015 10/09/15   Greer Pickerel, MD  rizatriptan (MAXALT-MLT) 10 MG disintegrating tablet Take 1 tablet (10 mg total) by mouth once. May repeat in 2 hours if needed Patient not taking: Reported on 10/11/2015 10/11/15 10/11/15  Melvenia Beam, MD  SUMAtriptan [facility]) 20 MG/ACT nasal spray Place 1 spray (20 mg total) into the nose once. May repeat in 2 hours if headache persists or recurs. Patient not taking: Reported on 10/11/2015 09/02/15   Melvenia Beam, MD  topiramate (TOPAMAX) 100 MG tablet Take 2 tablets (200 mg total) by mouth at bedtime. Patient taking differently: Take 100 mg by mouth daily.  07/22/15   Melvenia Beam, MD    Family History Family History  Problem Relation Age of Onset  . Cancer Mother   . Stroke Father   . Diabetes Brother   . Seizures Son   . Migraines Neg Hx     Social History Social History  Substance Use Topics  . Smoking status: Never  Smoker  . Smokeless tobacco: Never Used  . Alcohol use Yes     Comment: socially     Allergies   Sulfa antibiotics and Zosyn [piperacillin sod-tazobactam so]   Review of Systems Review of Systems  Constitutional: Negative for fever.  Respiratory: Negative for cough and shortness of breath.   Cardiovascular: Negative for chest pain.  Gastrointestinal: Positive for abdominal pain and nausea.       See HPI.  Genitourinary: Negative.   Musculoskeletal: Negative for myalgias.  Skin: Negative for color change.  Neurological: Negative for weakness.   Physical Exam Updated Vital Signs BP (!) 154/116 (BP Location: Right Arm)   Pulse 88   Temp 98.9 F (37.2 C) (Oral)   Resp 18   LMP 09/10/2015 Comment: spotting  SpO2 100%   Physical Exam  Constitutional: She is oriented to person, place, and time. She appears well-developed and well-nourished.  HENT:  Head: Normocephalic and atraumatic.  Eyes: Conjunctivae are normal. Pupils are equal, round, and reactive to light. Right eye  exhibits no discharge. Left eye exhibits no discharge. No scleral icterus.  Neck: Normal range of motion. No JVD present. No tracheal deviation present.  Cardiovascular: Normal rate.   No murmur heard. Pulmonary/Chest: Effort normal. No stridor. She has no wheezes. She has no rales.  Abdominal: There is tenderness. There is guarding.  Gastric tube in place. No bleeding or concerning drainage from the site. Pain with any manipulation. No surrounding redness. Minimal induration.   Neurological: She is alert and oriented to person, place, and time. Coordination normal.  Skin: Skin is warm and dry. No erythema.  Psychiatric: She has a normal mood and affect.  Nursing note and vitals reviewed.    ED Treatments / Results  Labs (all labs ordered are listed, but only abnormal results are displayed) Labs Reviewed - No data to display  EKG  EKG Interpretation None       Radiology Ct Abdomen Pelvis W Contrast  Result Date: 10/23/2015 CLINICAL DATA:  Feeding tube is slipping out an area has become inflamed and painful. EXAM: CT ABDOMEN AND PELVIS WITH CONTRAST TECHNIQUE: Multidetector CT imaging of the abdomen and pelvis was performed using the standard protocol following bolus administration of intravenous contrast. CONTRAST:  139mL ISOVUE-300 IOPAMIDOL (ISOVUE-300) INJECTION 61% COMPARISON:  CT of the abdomen and pelvis 10/03/2015. FINDINGS: Lower chest: The lung bases are clear without focal nodule, mass, or airspace disease. The heart size is normal. No significant pleural or pericardial effusion is present. Hepatobiliary: Mild intra and extrahepatic biliary dilation is stable following cholecystectomy. A hemangioma a laterally in the right lobe of the liver is stable. Pancreas: No significant inflammatory changes are present. No solid or cystic mass is present. There is no significant ductal dilation. Spleen: Within normal limits Adrenals/Urinary Tract: The adrenal glands are normal bilaterally  the kidneys and ureters are unremarkable. The urinary bladder is within normal limits. Stomach/Bowel: Gastric bypass surgery is noted. The feeding tube extends into the distal segment the on the anastomosis. The balloon is within the gastric remnant. Mild inflammatory changes are present superficially at the tube entry site without abscess. The small bowel is unremarkable. Distal anastomosis is intact. The appendix is surgically absent. The ascending and transverse colon are within normal limits. The descending and rectosigmoid colon are normal as well. Vascular/Lymphatic: No significant vascular lesions are present. There is no significant adenopathy. Reproductive: Heterogeneous uterus suggest multiple fibroids. The adnexa are within normal limits. Other: No significant free fluid or  free air is present. Musculoskeletal: Bone windows are unremarkable. No focal lytic or blastic lesions are present. The osseous pelvis is intact. IMPRESSION: 1. The feeding tube is intact. The balloon is within the distal gastric remnant. 2. Inflammatory changes are present at the tube entry site. There is no abscess. 3. Gastric bypass surgery otherwise intact. 4. Cholecystectomy. Electronically Signed   By: San Morelle M.D.   On: 10/23/2015 23:37   Procedures Procedures  DIAGNOSTIC STUDIES:  Oxygen Saturation is 100% on RA, normal by my interpretation.    COORDINATION OF CARE:  9:21 PM Discussed treatment plan with pt at bedside and pt agreed to plan.  Medications Ordered in ED Medications - No data to display   Initial Impression / Assessment and Plan / ED Course  I have reviewed the triage vital signs and the nursing notes.  Pertinent labs & imaging results that were available during my care of the patient were reviewed by me and considered in my medical decision making (see chart for details).  Clinical Course    Patient with a history of recent roux-en-Y procedure, with PEG tube placement presents  with severe pain from PEG site. She reports office recheck 5 days ago where tube was adjusted and she reports severe pain since. Pain radiates to the LUQ and left lateral abdominal wall. No fever.  IV started, with Dilaudid and Zofran provided. Recheck finds the patient more comfortable. Waiting for CT scan evaluation.   12:00 - CT abd/pel w/CM shows no acute finding. Labs are reassuring. Pain is improved. The patient is seen by Dr. Winfred Leeds and is felt stable for discharge home.     I personally performed the services described in this documentation, which was scribed in my presence. The recorded information has been reviewed and is accurate.   Final Clinical Impressions(s) / ED Diagnoses   Final diagnoses:  None  1. Post surgical abdominal pain   New Prescriptions New Prescriptions   No medications on file     Charlann Lange, Hershal Coria 10/24/15 0020    Orlie Dakin, MD 10/24/15 [phone]

## 2015-10-23 NOTE — ED Notes (Signed)
PA at bedside.

## 2015-10-23 NOTE — ED Notes (Signed)
Pt transported to CT ?

## 2015-10-23 NOTE — ED Triage Notes (Signed)
Pt states she is having problems with her feeding tube  Pt states she had it placed on July 24th  Pt was seen by her dr on Friday and he manipulated it  Pt states since then it has been sucking in and out and moving around  Pt states the site is swollen and very painful

## 2015-10-24 DIAGNOSIS — E441 Mild protein-calorie malnutrition: Secondary | ICD-10-CM | POA: Diagnosis not present

## 2015-10-24 DIAGNOSIS — Z79899 Other long term (current) drug therapy: Secondary | ICD-10-CM | POA: Diagnosis not present

## 2015-10-24 DIAGNOSIS — G8918 Other acute postprocedural pain: Secondary | ICD-10-CM | POA: Diagnosis not present

## 2015-10-24 DIAGNOSIS — K9589 Other complications of other bariatric procedure: Secondary | ICD-10-CM | POA: Diagnosis not present

## 2015-10-24 DIAGNOSIS — R110 Nausea: Secondary | ICD-10-CM | POA: Diagnosis not present

## 2015-10-24 NOTE — ED Provider Notes (Signed)
Complains of pain around feeding tube insertion onset I've days ago. No fever. She has been able to use the feeding tube without difficulty. Pain is not increased with feeding.   Orlie Dakin, MD 10/24/15 [phone]

## 2015-10-24 NOTE — Discharge Instructions (Signed)
YOU CAN INCREASE YOUR PAIN MEDICATION TO 5ML (5 MG) EVERY 6 HOURS AS NEEDED FOR PAIN. FOLLOW UP WITH DR. Redmond Pulling FOR FURTHER EVALUATION OF INCREASED PAIN AT THE SITE OF YOUR GASTRIC TUBE. RETURN TO THE EMERGENCY DEPARTMENT WITH ANY SEVERE PAIN, HIGH FEVER OR NEW CONCERN.

## 2015-10-25 ENCOUNTER — Ambulatory Visit: Payer: Self-pay | Admitting: *Deleted

## 2015-10-25 MED FILL — CYCLOBENZAPRINE 10 MG TAB: 10 | 13 days supply | Qty: 40 | Fill #0

## 2015-10-29 ENCOUNTER — Ambulatory Visit: Payer: Self-pay | Admitting: *Deleted

## 2015-10-29 ENCOUNTER — Other Ambulatory Visit: Payer: Self-pay | Admitting: *Deleted

## 2015-10-29 NOTE — Patient Outreach (Addendum)
[facility]) Care Management  10/29/2015  Margaret Fletcher 24-Feb-1971 [id number]   Subjective: Telephone call to patient's home number, spoke with patient, and HIPAA verified.   States she is doing well and her abdominal pain is much better.  States she had an ED visit on 10/23/15 due to increased abdominal pain.   States the bariatric nurse coordinator was contacted.  The bariatric coordinator contacted Dr. Redmond Pulling, and she was prescribed a muscle relaxer.   States the muscle relaxer has decreased the pain around her feeding tube and she is able to manage the pain.   States she has her follow up appointment with Dr. Redmond Pulling on 10/30/15.   Patient states she is doing well and independent with supplemental tube feedings.   States she has only had 2 supplemental feedings over the last week and is tolerating nutrition by mouth.  States she continues to have periodic nausea, which she was told was to be expected, and it is being controlled by nausea medication as needed. States home health RN visits are going well.   Patient advised to keep scale received from [facility] and no need to return.  Patient voiced understanding and appreciative of the follow up.  Patient states she still has not set up an appointment with primary MD, is aware of the importance, and is still planning to set it up at some point.  States she also has a neurologist follow up appointment on 12/03/15 and a follow up appointment with [employer] (Employee Assistance Counseling Program) next week.  States she has received [facility]  Packet, EMMI information, and has no questions.  States her long term disability is pending further review, continues to [facility], and Accident policy claims as appropriate.   Patient in agreement to continue to receive [facility] Care Management UMR Transition of care follow up.  Objective: Per chart review: Patient has a history of Prediabetes, status post gastric bypass (hospitalized 07/08/15  - 07/10/15), autoimmune disorder, total parenteral nutrition (TPN), alopecia,depression, and Protein-calorie malnutrition. Her postop course was complicated by being readmitted on May 8 through the 12 for difficulty with oral intake. Received IV fluids 3 timesper week via PICC line. Readmitted 08/16/15 - 08/19/15 with epigastric pain, sent home with TPN. Treated in ED 08/21/15 hyperkalemia. Has continued to have increased headaches. Readmitted 09/11/15 -09/14/15 bacteremia with PICC removal. Readmitted 09/30/15 -10/10/15 epigastric pain, moderate protein malnutrition, s/p revision Roux limb, G-tube placement on 09/30/15.   Assessment: Received referral on 10/03/15 and patient still inpatient upon initial review. Telephone screen and transition of care follow up completed. Patient with multiple readmission, RNCM will continue to follow for transition of care.   Patient has no care coordination needs at this time.   Plan:RNCM will call patient for telephone outreach within 10 business days and will continue to follow for transition of care.  RNCM will follow up with patient within 10 business days to verify outcome of surgeon follow up appointment and status of primary MD follow up appointment.       Aeon Koors H. Annia Friendly, BSN, [facility] Telephonic CM Phone: [phone] Fax: [phone]

## 2015-10-31 ENCOUNTER — Encounter: Payer: 59 | Attending: General Surgery | Admitting: Dietician

## 2015-10-31 ENCOUNTER — Encounter: Payer: Self-pay | Admitting: Dietician

## 2015-10-31 DIAGNOSIS — Z713 Dietary counseling and surveillance: Secondary | ICD-10-CM | POA: Diagnosis not present

## 2015-10-31 DIAGNOSIS — E441 Mild protein-calorie malnutrition: Secondary | ICD-10-CM

## 2015-10-31 NOTE — Patient Instructions (Addendum)
-  Add moisture to meats (mayonnaise, mustard, yogurt ranch)  -Chew thoroughly, eat slowly, and take tiny bites -Experiment with some different temperatures -Control your environment (create a pleasant, simple atmosphere for trying new foods) -Continue to try to experiment with foods -Increase intake as tolerated -Eat protein first when you're able -Keep a food journal  Goal: -Able to meet nutrition needs orally   Surgery date: 07/08/2015 Surgery type: RYGB Start weight at [facility]: 278 lbs on 05/24/2014 Weight today: 219.6 lbs Weight change: 34.2 lbs Total weight loss: 58.4 lbs Goal weight: 197 lbs  TANITA  BODY COMP RESULTS  07/01/15 07/23/15 10/31/15   BMI (kg/m^2) 40.4 38.6 33.4   Fat Mass (lbs) 138.5 131.0 103.2   Fat Free Mass (lbs) 127.5 122.8 116.4   Total Body Water (lbs) 93.5 90.4 84.4

## 2015-10-31 NOTE — Progress Notes (Signed)
Follow-up visit: 4 months Post-Operative RYGB Surgery  Medical Nutrition Therapy:  Appt start time: 0820 end time:  0940.  Primary concerns today: Post-operative Bariatric Surgery Nutrition Management.  Margaret Fletcher returns for a bariatric nutrition follow up 4 months post op RYGB. 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 She reports that she was "fine" (tolerating prescribed liquid diet) until 6 days post op and then got a "block" and became unable to tolerate anything by mouth. Received a PICC line for hydration; TPN was initiated on 08/19/2015. TPN discontinued on July 5; PICC line removed due to infection. Received PEG tube on 09/30/15, tube currently placed in excluded/bypassed portion of stomach. Patient complaining of pain, soreness, and swelling. Continues to have nausea. Jazae states that the port "pulls/sucks into skin and pops back out" and this perpetuates pain and nausea. Takes muscle relaxers to help her sleep at night. Patient denies vomiting. Having irregular bowel movements and constipation. Puts Miralax and lactulose in tube but cannot tell whether this helps. Trying to take in foods and liquids by mouth and uses enteral formula to supplement intake. Notices significant taste changes and sensitivity to smell ("distorted smell").  "Nothing tastes the same." Tastes had started changing prior to surgery. Likes Environmental consultant drink (similar to Gatorade). Does not tolerate meat well (painful, like "rocks"). Needs to drink with meats. Able to tolerate up to 2 oz meat at a time. She notices that some days she is able to eat more than others. [name] has been struggling with depression. She is recently single and living alone. Plans to see a counselor. Headaches have improved since weight loss.  [facility] RN scheduled to come to patient's home 2x a week.  Surgery date: 07/08/2015 Surgery type:  RYGB Start weight at [facility]: 278 lbs on 05/24/2014 Weight today: 219.6 lbs Weight change: 34.2 lbs Total weight loss: 58.4 lbs Goal weight: 197 lbs  TANITA  BODY COMP RESULTS  07/01/15 07/23/15 10/31/15   BMI (kg/m^2) 40.4 38.6 33.4   Fat Mass (lbs) 138.5 131.0 103.2   Fat Free Mass (lbs) 127.5 122.8 116.4   Total Body Water (lbs) 93.5 90.4 84.4    Preferred Learning Style:   No preference indicated   Learning Readiness:   Ready  24-hr recall: B (8AM): Premier protein shake (30g) Snk (AM):   L (3-4PM): water throughout the day, chicken or yogurt Snk (PM): sometimes Body Armor drink  D (PM):  Snk (PM):   Supplements with half a can to one whole can of formula (Vital high protein) if intake is poor. Patient reports that she has to supplement 2-3x a week.  States that when she needs to use enteral formula it does not leave room for "test driving foods."  1 can (8 oz) Vital High protein enteral formula provides 237 calories, 20.7 grams protein, 5 grams fat, 26.7 grams carbohydrate, 7 oz water. Patient flushes tube with "small amounts of water."  Fluid intake: 11 oz, 34 oz water, sweet tea cut with water  Estimated total protein intake: unable to determine  Medications: see list Supplementation: liquid multivitamin [name]), liquid Calcium + vitamin D, B12  Drinking while eating: yes Hair loss: unknown, patient has alopecia Carbonated beverages: tried a Coke for nausea but did not like it N/V/D/C: nausea and constipation Dumping syndrome: none  Recent physical activity:  Did not assess  Progress Towards Goal(s):  In progress.  Handouts given  during visit include:  none   Nutritional Diagnosis:  NI-2.1 Inadequate intake As related to food intolerance, nausea, and taste aversions.  As evidenced by patient requiring enteral nutrition support to meet nutrient needs.    Intervention:  Nutrition counseling provided. Praised patient on efforts to increase po intake and  meet nutrition needs. Discussed strategies to increase tolerance of foods and drinks. Encouraged patient to continue to try new foods without limitations. Reviewed post op diet phases at the request of the patient; encouraged protein foods but lifted all restrictions regarding types of foods at this time. The patient and I agreed to resume discussions regarding a diet to promote weight loss when she is able to meet nutrition needs orally.  Goals: -Add moisture to meats (mayonnaise, mustard, yogurt ranch)  -Chew thoroughly, eat slowly, and take tiny bites -Experiment with some different temperatures -Control your environment (create a pleasant, simple atmosphere for trying new foods) -Continue to try to experiment with foods -Increase intake as tolerated -Eat protein first when you're able -Keep a food journal  Teaching Method Utilized:  Visual Auditory Hands on  Barriers to learning/adherence to lifestyle change: nausea and inability to meet protein needs orally  Demonstrated degree of understanding via:  Teach Back   Monitoring/Evaluation:  Dietary intake, exercise, and body weight. Follow up in 1 months for 5 month post-op visit.

## 2015-11-01 DIAGNOSIS — E441 Mild protein-calorie malnutrition: Secondary | ICD-10-CM | POA: Diagnosis not present

## 2015-11-01 DIAGNOSIS — G8918 Other acute postprocedural pain: Secondary | ICD-10-CM | POA: Diagnosis not present

## 2015-11-01 DIAGNOSIS — K9589 Other complications of other bariatric procedure: Secondary | ICD-10-CM | POA: Diagnosis not present

## 2015-11-04 DIAGNOSIS — E441 Mild protein-calorie malnutrition: Secondary | ICD-10-CM | POA: Diagnosis not present

## 2015-11-04 DIAGNOSIS — K9589 Other complications of other bariatric procedure: Secondary | ICD-10-CM | POA: Diagnosis not present

## 2015-11-04 DIAGNOSIS — G8918 Other acute postprocedural pain: Secondary | ICD-10-CM | POA: Diagnosis not present

## 2015-11-06 DIAGNOSIS — E441 Mild protein-calorie malnutrition: Secondary | ICD-10-CM | POA: Diagnosis not present

## 2015-11-06 DIAGNOSIS — E43 Unspecified severe protein-calorie malnutrition: Secondary | ICD-10-CM | POA: Diagnosis not present

## 2015-11-07 ENCOUNTER — Ambulatory Visit: Payer: Self-pay | Admitting: *Deleted

## 2015-11-07 ENCOUNTER — Other Ambulatory Visit: Payer: Self-pay | Admitting: *Deleted

## 2015-11-07 DIAGNOSIS — E441 Mild protein-calorie malnutrition: Secondary | ICD-10-CM | POA: Diagnosis not present

## 2015-11-07 DIAGNOSIS — K9589 Other complications of other bariatric procedure: Secondary | ICD-10-CM | POA: Diagnosis not present

## 2015-11-07 DIAGNOSIS — G8918 Other acute postprocedural pain: Secondary | ICD-10-CM | POA: Diagnosis not present

## 2015-11-07 NOTE — Patient Outreach (Addendum)
Margaret Fletcher  11/07/2015  Margaret Fletcher [birth year] [id number]   Subjective: Telephone call to patient's home / mobile number, spoke with patient, and HIPAA verified.   States she is currently unavailable to talk and request call back.  Patient in agreement to continue to receive [facility] UMR Transition of care follow up.  Objective: Per chart review: Patient has a history of Prediabetes, status post gastric bypass (hospitalized 07/08/15 - 07/10/15), autoimmune disorder, total parenteral nutrition (TPN), alopecia,depression, and Protein-calorie malnutrition. Her postop course was complicated by being readmitted on May 8 through the 12 for difficulty with oral intake. Received IV fluids 3 timesper week via PICC line. Readmitted 08/16/15 - 08/19/15 with epigastric pain, sent home with TPN. Treated in ED 08/21/15 hyperkalemia. Has continued to have increased headaches. Readmitted 09/11/15 -09/14/15 bacteremia with PICC removal. Readmitted 09/30/15 -10/10/15 epigastric pain, moderate protein malnutrition, s/p revision Roux limb, G-tube placement on 09/30/15.   Assessment: Received referral on 10/03/15 and patient still inpatient upon initial review. Transition of care follow up pending, patient contact. Patient with multiple readmission, RNCM will continue to follow for transition of care.   Patient has no care coordination needs at this time.   Plan:RNCM will call patient for telephone outreach within 10 business days, for 2nd telephonic outreach attempt and will continue to follow for transition of care.  RNCM will follow up with patient within 10 business days to verify outcome of surgeon follow up appointment and status of primary MD follow up appointment.     Margaret Fletcher H. Annia Friendly, BSN, [facility] Telephonic CM Phone: [phone] Fax: [phone]

## 2015-11-08 ENCOUNTER — Ambulatory Visit: Payer: Self-pay | Admitting: *Deleted

## 2015-11-08 ENCOUNTER — Other Ambulatory Visit: Payer: Self-pay | Admitting: *Deleted

## 2015-11-08 NOTE — Patient Outreach (Addendum)
Margaret Fletcher [facility]) Care Management  11/08/2015  Margaret Fletcher November 02, 1970 [id number]  Subjective: Telephone call to patient's home / mobile number, spoke with patient, and HIPAA verified.  Patient states she is doing well, surgery follow up and all other appointment  went well.   States she continues to do tube feedings independently approximately 2 times a week and is taking food by mouth as directed.  Patient states she does not have any transition of care, care coordination, disease management, disease monitoring, transportation, community resource, or pharmacy needs at this time.   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  Objective: Per chart review: Patient has a history of Prediabetes, status post gastric bypass (hospitalized 07/08/15 - 07/10/15), autoimmune disorder, total parenteral nutrition (TPN), alopecia,depression, and Protein-calorie malnutrition. Her postop course was complicated by being readmitted on May 8 through the 12 for difficulty with oral intake. Received IV fluids 3 timesper week via PICC line. Readmitted 08/16/15 - 08/19/15 with epigastric pain, sent home with TPN. Treated in ED 08/21/15 hyperkalemia. Has continued to have increased headaches. Readmitted 09/11/15 -09/14/15 bacteremia with PICC removal. Readmitted 09/30/15 -10/10/15 epigastric pain, moderate protein malnutrition, s/p revision Roux limb, G-tube placement on 09/30/15.   Assessment: Received referral on 10/03/15 and patient still inpatient upon initial review. Transition of care follow completed and patient has no care coordination needs at this time.  Will proceed with case closure.   Plan:RNCM will send case closure due to follow up completed / no care management needs request to [name].      Margaret Fletcher H. Annia Friendly, BSN, [facility] Telephonic CM Phone: [phone] Fax: [phone]

## 2015-11-09 DIAGNOSIS — E43 Unspecified severe protein-calorie malnutrition: Secondary | ICD-10-CM | POA: Diagnosis not present

## 2015-11-09 DIAGNOSIS — E441 Mild protein-calorie malnutrition: Secondary | ICD-10-CM | POA: Diagnosis not present

## 2015-11-12 DIAGNOSIS — G8918 Other acute postprocedural pain: Secondary | ICD-10-CM | POA: Diagnosis not present

## 2015-11-12 DIAGNOSIS — K9589 Other complications of other bariatric procedure: Secondary | ICD-10-CM | POA: Diagnosis not present

## 2015-11-12 DIAGNOSIS — E441 Mild protein-calorie malnutrition: Secondary | ICD-10-CM | POA: Diagnosis not present

## 2015-11-13 DIAGNOSIS — E441 Mild protein-calorie malnutrition: Secondary | ICD-10-CM | POA: Diagnosis not present

## 2015-11-13 DIAGNOSIS — G8918 Other acute postprocedural pain: Secondary | ICD-10-CM | POA: Diagnosis not present

## 2015-11-13 DIAGNOSIS — K9589 Other complications of other bariatric procedure: Secondary | ICD-10-CM | POA: Diagnosis not present

## 2015-11-15 ENCOUNTER — Ambulatory Visit (INDEPENDENT_AMBULATORY_CARE_PROVIDER_SITE_OTHER): Payer: 59 | Admitting: Family Medicine

## 2015-11-15 VITALS — BP 126/84 | HR 80 | Temp 98.7°F | Resp 16 | Ht 68.0 in | Wt 221.0 lb

## 2015-11-15 DIAGNOSIS — Z9884 Bariatric surgery status: Secondary | ICD-10-CM | POA: Diagnosis not present

## 2015-11-15 DIAGNOSIS — Z931 Gastrostomy status: Secondary | ICD-10-CM | POA: Diagnosis not present

## 2015-11-15 DIAGNOSIS — F4323 Adjustment disorder with mixed anxiety and depressed mood: Secondary | ICD-10-CM | POA: Diagnosis not present

## 2015-11-15 DIAGNOSIS — R7303 Prediabetes: Secondary | ICD-10-CM

## 2015-11-15 MED FILL — ALPRAZolam 0.5 MG TABS: 0.5 | 14 days supply | Qty: 30 | Fill #1

## 2015-11-15 NOTE — Progress Notes (Signed)
Subjective:  By signing my name below, I, [name], attest that this documentation has been prepared under the direction and in the presence of Merri Ray, MD. Electronically Signed: [name], [place]. 11/15/2015 , 5:09 PM .  Patient was seen in Room 9 .   Patient ID: Margaret Fletcher, female    DOB: 1970/12/16, 45 y.o.   MRN: [id number] Chief Complaint  Patient presents with  . Other    Pt is here for a hospital follow up - pt has a feeding tube. Pt stse she has had several hospital visit/surgeries and was told she needed to see her PCP   HPI Margaret Fletcher is a 45 y.o. female Here for hospital follow up. Last seen by me on May 30th for dysuria; at that time had gastric bypass surgery 1 month prior and had picc line for fluid and vitamins post surgery.   Admission: June 9th - June 12th She was admitted on June 9th through June 12th for abdominal pain, protein calorie malnutrition and PO intolerance. On June 9th hospitalization, she had an endoscopy without significant findings to explain epigastric discomfort. She was started on TPN at home. ER evaluation on June 14th for hyperkalemia, noted to be 8.1 in the ER. Repeat CMP showed normal potassium in the ER, suspected lab error.   Admission: July 5th - July 8th Next admission was July 5th through July 8th again for malnutrition and bacteremia, diagnosed with enterobacter bacteremia; picc line removed. Discharged for 14 days of therapy and TPN was discontinued.   Admission: July 24th - August 3rd She was again admitted July 24th through August 3rd for malnutrition and epigastric pain. She under went revision of roux-en-y gastric bypass. She was placed on gastrotomy tube on July 24th, plan for supplemental feedings for oral intake. There is a plan of 8 hours feeding G-tube at night if she had decreased oral intake during the day.   ER Evaluation - August 16th She was seen for pain around her feeding tube. She had CT abdomen  and pelvis without acute findings and was discharged home. She had nutritionist evaluation on August 24th.   Patient's bariatric surgeon is Dr. Redmond Pulling, with next appointment on 9/26. She uses her G-tube 2~3 times a week. She mentions feeling more fatigue than usual. She had 1-time visit of outpatient physical therapy. She denies any recent fevers. She has a small complication with her G-tube but notes she's informed Dr. Redmond Pulling about this issue.   Anxiety She was treated with wellbutrin and xanax by Dr. Joseph Art, most recently in April for adjustment disorder and depression, as well as chronic headaches by Dr. Jaynee Eagles with [facility] neurology.   She takes xanax prn, last prescribed on April 27th for #30 with 1 refill. She takes about 3~4 times a week. She's met with a counselor, meeting for everything.   Pre diabetes Lab Results  Component Value Date   HGBA1C 5.6 04/06/2015   Wt Readings from Last 3 Encounters:  11/15/15 221 lb (100.2 kg)  10/31/15 219 lb 9.6 oz (99.6 kg)  09/30/15 235 lb (106.6 kg)   Immunizations She would like to defer receiving flu shot, as she believes she'll receive at work at a later time.   Patient Active Problem List   Diagnosis Date Noted  . Protein-calorie malnutrition, severe 10/08/2015  . Mild protein-calorie malnutrition ([place]) 10/07/2015  . Bacteremia 09/12/2015  . Fever 09/11/2015  . Protein-calorie malnutrition, mild ([place]) 08/19/2015  . Epigastric pain 08/16/2015  .  Dehydration 07/15/2015  . Prediabetes 07/08/2015  . Morbid obesity ([place]) 07/08/2015  . Mild obstructive sleep apnea 07/08/2015  . Dyslipidemia 07/08/2015  . S/P gastric bypass 07/08/2015  . IIH (idiopathic intracranial hypertension) 06/03/2015  . Chronic migraine without aura or status migrainosus 04/30/2015  . Worsening headaches 04/30/2015  . Vision changes 04/30/2015  . Perceived hearing changes 04/30/2015  . Liver lesion 03/21/2013  . Splenic cyst 03/21/2013  . Abdominal pain  03/21/2013   Past Medical History:  Diagnosis Date  . Alopecia   . Anxiety   . Autoimmune disease ([place])    "Alopecia"  . Bacterial vaginosis   . Cyst of spleen   . Depression   . Depression   . Frequent UTI   . Hypertrophy of breast   . IIH (idiopathic intracranial hypertension) 2017   "related to headaches"  . Migraine   . Migraine headache    denies, ruled out - Propranolol.Using for headaches   Past Surgical History:  Procedure Laterality Date  . APPENDECTOMY    . BREAST REDUCTION SURGERY Bilateral 03/26/2015   Procedure: BILATERAL BREAST REDUCTION  ;  Surgeon: Youlanda Roys, MD;  Location: [place];  Service: Plastics;  Laterality: Bilateral;  . BREATH TEK H PYLORI N/A 05/21/2014   Procedure: BREATH TEK H PYLORI;  Surgeon: Greer Pickerel, MD;  Location: [facility];  Service: General;  Laterality: N/A;  . CESAREAN SECTION    . CESAREAN SECTION    . CHOLECYSTECTOMY    . DILITATION & CURRETTAGE/HYSTROSCOPY WITH NOVASURE ABLATION N/A 07/19/2012   Procedure: HYSTEROSCOPY WITH NOVASURE ABLATION;  Surgeon: Farrel Gobble. Harrington Challenger, MD;  Location: [facility];  Service: Gynecology;  Laterality: N/A;  . ENDOMETRIAL ABLATION    . ESOPHAGOGASTRODUODENOSCOPY N/A 08/16/2015   Procedure: UPPER ESOPHAGOGASTRODUODENOSCOPY (EGD);  Surgeon: Greer Pickerel, MD;  Location: [facility];  Service: General;  Laterality: N/A;  . IR GENERIC HISTORICAL  10/04/2015   IR [place] Vivianne Master 10/04/2015 Markus Daft, MD [facility]  . LAPAROSCOPIC GASTROSTOMY N/A 09/30/2015   Procedure: LAPAROSCOPIC GASTROSTOMY TUBE PLACEMENT;  Surgeon: Greer Pickerel, MD;  Location: [facility];  Service: General;  Laterality: N/A;  . LAPAROSCOPIC ROUX-EN-Y GASTRIC BYPASS WITH HIATAL HERNIA REPAIR N/A 07/08/2015   Procedure: LAPAROSCOPIC ROUX-EN-Y GASTRIC BYPASS WITH HIATAL HERNIA REPAIR WITH UPPER ENDOSCOPY;  Surgeon: Greer Pickerel, MD;  Location: [facility];  Service: General;  Laterality: N/A;  . LAPAROSCOPIC  SMALL BOWEL RESECTION N/A 09/30/2015   Procedure: LAPAROSCOPIC REVISION OF ROUX LIMB;  Surgeon: Greer Pickerel, MD;  Location: [facility];  Service: General;  Laterality: N/A;  . LAPAROSCOPY N/A 09/30/2015   Procedure: LAPAROSCOPY DIAGNOSTIC;  Surgeon: Greer Pickerel, MD;  Location: [facility];  Service: General;  Laterality: N/A;  . TUBAL LIGATION    . WISDOM TOOTH EXTRACTION     Allergies  Allergen Reactions  . Sulfa Antibiotics Anaphylaxis  . Zosyn [Piperacillin Sod-Tazobactam So] Rash   Prior to Admission medications   Medication Sig Start Date End Date Taking? Authorizing Provider  ALPRAZolam Duanne Moron) 0.5 MG tablet Take 1 tablet (0.5 mg total) by mouth 2 (two) times daily as needed for anxiety. Can take 1-2 30 minutes before MRI. Do not drive. 07/04/15  Yes Robyn Haber, MD  buPROPion (WELLBUTRIN XL) 300 MG 24 hr tablet Take 1 tablet (300 mg total) by mouth daily. 07/04/15  Yes Robyn Haber, MD  CALCIUM-VITAMIN D PO Take 15 mLs by mouth 3 (three) times daily.   Yes Historical Provider, MD  Cyanocobalamin (  VITAMIN B-12) 1000 MCG/15ML LIQD Take 5 mLs by mouth daily.   Yes Historical Provider, MD  lactulose (CHRONULAC) 10 GM/15ML solution Take 10 g by mouth daily as needed for mild constipation.   Yes Historical Provider, MD  Multiple Vitamin (MULTIVITAMIN) LIQD Take 5 mLs by mouth 2 (two) times daily.   Yes Historical Provider, MD  Nutritional Supplements (FEEDING SUPPLEMENT, VITAL HIGH PROTEIN,) LIQD liquid Place 1,000 mLs into feeding tube daily. 47m/hr for 8hrs a day as needed for poor oral intake; 300 ml free water q4hr 10/09/15  Yes EGreer Pickerel MD  ondansetron (ZOFRAN-ODT) 4 MG disintegrating tablet Take 1 tablet (4 mg total) by mouth every 6 (six) hours as needed for nausea. 07/19/15  Yes EGreer Pickerel MD  oxyCODONE (ROXICODONE) 5 MG/5ML solution Take 2.5 mLs (2.5 mg total) by mouth every 6 (six) hours as needed for moderate pain or severe pain. 10/07/15  Yes EGreer Pickerel MD  pantoprazole (PROTONIX)  40 MG tablet Take 40 mg by mouth daily. 07/19/15  Yes Historical Provider, MD  polyethylene glycol (MIRALAX / GLYCOLAX) packet Take 17 g by mouth daily. 10/09/15  Yes EGreer Pickerel MD  simethicone (MYLICON) 80 MG chewable tablet Chew 80 mg by mouth every 6 (six) hours as needed for flatulence.   Yes Historical Provider, MD  topiramate (TOPAMAX) 100 MG tablet Take 2 tablets (200 mg total) by mouth at bedtime. Patient taking differently: Take 100 mg by mouth 2 (two) times daily.  07/22/15  Yes AMelvenia Beam MD  rizatriptan (MAXALT-MLT) 10 MG disintegrating tablet Take 1 tablet (10 mg total) by mouth once. May repeat in 2 hours if needed 10/11/15 10/23/15  AMelvenia Beam MD  SUMAtriptan ([facility] 20 MG/ACT nasal spray Place 1 spray (20 mg total) into the nose once. May repeat in 2 hours if headache persists or recurs. Patient not taking: Reported on 10/11/2015 09/02/15   AMelvenia Beam MD   Social History   Social History  . Marital status: Divorced    Spouse name: N/A  . Number of children: 2  . Years of education: 16   Occupational History  . [employer]- nurse tech    Social History Main Topics  . Smoking status: Never Smoker  . Smokeless tobacco: Never Used  . Alcohol use Yes     Comment: socially  . Drug use: No  . Sexual activity: Yes    Birth control/ protection: Surgical   Other Topics Concern  . Not on file   Social History Narrative   Lives with partner   Caffeine use: minimal coffee   Review of Systems  Constitutional: Positive for fatigue. Negative for chills, diaphoresis and fever.  Gastrointestinal: Positive for abdominal pain. Negative for diarrhea, nausea and vomiting.  Psychiatric/Behavioral: Negative for dysphoric mood, self-injury and suicidal ideas. The patient is nervous/anxious.        Objective:   Physical Exam  Constitutional: She is oriented to person, place, and time. She appears well-developed and well-nourished. No distress.  HENT:  Head:  Normocephalic and atraumatic.  Eyes: EOM are normal. Pupils are equal, round, and reactive to light.  Neck: Neck supple.  Cardiovascular: Normal rate.   Pulmonary/Chest: Effort normal. No respiratory distress.  Abdominal:  G-tube in place over left abdomen  Musculoskeletal: Normal range of motion.  Neurological: She is alert and oriented to person, place, and time.  Skin: Skin is warm and dry.  Psychiatric: She has a normal mood and affect. Her behavior is normal.  Nursing note and vitals reviewed.   Vitals:   11/15/15 1554  BP: 126/84  Pulse: 80  Resp: 16  Temp: 98.7 F (37.1 C)  TempSrc: Oral  SpO2: 98%  Weight: 221 lb (100.2 kg)  Height: '5\' 8"'$  (1.727 m)   30 minutes of face to face with coordination and review of chart with prior hospitalizations    Assessment & Plan:  Margaret Fletcher is a 45 y.o. female Adjustment disorder with mixed anxiety and depressed mood  - Overall stable, including with the complications to her health and multiple hospitalizations. No change in medications for now, continue Wellbutrin same dose. Xanax if needed. Should have a refill, but call if refill of Xanax needed with plan on recheck in 3 months  S/P gastric bypass - Plan: COMPLETE METABOLIC PANEL WITH GFR G tube feedings (HCC) - Plan: COMPLETE METABOLIC PANEL WITH GFR  -History of gastric bypass with some difficulty tolerating oral intake, multiple hospitalizations noted. Most recently has G-tube if needed for nutrition. Stable, only intermittently needing G-tube throughout the week, and plans on routine follow-up with her surgeon.  Prediabetes - Plan: Hemoglobin A1C.    No orders of the defined types were placed in this encounter.  Patient Instructions   Whenever you're ready, return for flu shot. I did check some blood work to monitor kidney, liver tests and some other electrolytes. With history of prediabetes in the past, I did check another A1c or 3 month blood sugar average. No  change in Wellbutrinfor now, check to see if you do have a refill of the Xanax. If not, let me know and I can send one in.  Follow-up with [name] in 3 months, sooner if any new or worsening symptoms. Keep follow-up with your neurologist and surgeon.     IF you received an x-ray today, you will receive an invoice from [facility]. Please contact [facility] at [phone] with questions or concerns regarding your invoice.   IF you received labwork today, you will receive an invoice from [employer]. Please contact [facility] at [phone] with questions or concerns regarding your invoice.   Our billing staff will not be able to assist you with questions regarding bills from these companies.  You will be contacted with the lab results as soon as they are available. The fastest way to get your results is to activate your My Chart account. Instructions are located on the last page of this paperwork. If you have not heard from [name] regarding the results in 2 weeks, please contact this office.        I personally performed the services described in this documentation, which was scribed in my presence. The recorded information has been reviewed and considered, and addended by me as needed.   Signed,   Merri Ray, MD [facility].  11/17/15 1:05 PM

## 2015-11-15 NOTE — Patient Instructions (Addendum)
Whenever you're ready, return for flu shot. I did check some blood work to monitor kidney, liver tests and some other electrolytes. With history of prediabetes in the past, I did check another A1c or 3 month blood sugar average. No change in Wellbutrinfor now, check to see if you do have a refill of the Xanax. If not, let me know and I can send one in.  Follow-up with [name] in 3 months, sooner if any new or worsening symptoms. Keep follow-up with your neurologist and surgeon.     IF you received an x-ray today, you will receive an invoice from [facility]. Please contact [facility] at [phone] with questions or concerns regarding your invoice.   IF you received labwork today, you will receive an invoice from [employer]. Please contact [facility] at [phone] with questions or concerns regarding your invoice.   Our billing staff will not be able to assist you with questions regarding bills from these companies.  You will be contacted with the lab results as soon as they are available. The fastest way to get your results is to activate your My Chart account. Instructions are located on the last page of this paperwork. If you have not heard from [name] regarding the results in 2 weeks, please contact this office.

## 2015-11-16 LAB — COMPLETE METABOLIC PANEL WITH GFR
ALBUMIN: 3.6 g/dL (ref 3.6–5.1)
ALT: 11 U/L (ref 6–29)
AST: 14 U/L (ref 10–35)
Alkaline Phosphatase: 59 U/L (ref 33–115)
BILIRUBIN TOTAL: 0.3 mg/dL (ref 0.2–1.2)
BUN: 15 mg/dL (ref 7–25)
CO2: 27 mmol/L (ref 20–31)
CREATININE: 0.95 mg/dL (ref 0.50–1.10)
Calcium: 8.8 mg/dL (ref 8.6–10.2)
Chloride: 103 mmol/L (ref 98–110)
GFR, EST AFRICAN AMERICAN: 84 mL/min (ref >=60)
GFR, Est Non African American: 73 mL/min (ref >=60)
GLUCOSE: 83 mg/dL (ref 65–99)
Potassium: 4.2 mmol/L (ref 3.5–5.3)
Sodium: 142 mmol/L (ref 135–146)
TOTAL PROTEIN: 6.7 g/dL (ref 6.1–8.1)

## 2015-11-17 LAB — HEMOGLOBIN A1C
Hgb A1c MFr Bld: 5 % (ref <5.7)
MEAN PLASMA GLUCOSE: 97 mg/dL

## 2015-11-18 ENCOUNTER — Telehealth: Payer: Self-pay

## 2015-11-21 DIAGNOSIS — K9589 Other complications of other bariatric procedure: Secondary | ICD-10-CM | POA: Diagnosis not present

## 2015-11-21 DIAGNOSIS — G8918 Other acute postprocedural pain: Secondary | ICD-10-CM | POA: Diagnosis not present

## 2015-11-21 DIAGNOSIS — E441 Mild protein-calorie malnutrition: Secondary | ICD-10-CM | POA: Diagnosis not present

## 2015-11-27 DIAGNOSIS — K9589 Other complications of other bariatric procedure: Secondary | ICD-10-CM | POA: Diagnosis not present

## 2015-11-27 DIAGNOSIS — G8918 Other acute postprocedural pain: Secondary | ICD-10-CM | POA: Diagnosis not present

## 2015-11-27 DIAGNOSIS — E441 Mild protein-calorie malnutrition: Secondary | ICD-10-CM | POA: Diagnosis not present

## 2015-11-28 ENCOUNTER — Ambulatory Visit: Payer: 59

## 2015-11-29 ENCOUNTER — Ambulatory Visit (INDEPENDENT_AMBULATORY_CARE_PROVIDER_SITE_OTHER): Payer: 59 | Admitting: Physician Assistant

## 2015-11-29 VITALS — BP 116/78 | HR 78 | Temp 99.1°F | Resp 16 | Ht 68.0 in | Wt 215.0 lb

## 2015-11-29 DIAGNOSIS — Z23 Encounter for immunization: Secondary | ICD-10-CM | POA: Diagnosis not present

## 2015-11-29 DIAGNOSIS — N3000 Acute cystitis without hematuria: Secondary | ICD-10-CM | POA: Diagnosis not present

## 2015-11-29 DIAGNOSIS — Z299 Encounter for prophylactic measures, unspecified: Secondary | ICD-10-CM

## 2015-11-29 DIAGNOSIS — R300 Dysuria: Secondary | ICD-10-CM | POA: Diagnosis not present

## 2015-11-29 DIAGNOSIS — Z418 Encounter for other procedures for purposes other than remedying health state: Secondary | ICD-10-CM | POA: Diagnosis not present

## 2015-11-29 LAB — POCT URINALYSIS DIP (MANUAL ENTRY)
Bilirubin, UA: NEGATIVE
Glucose, UA: NEGATIVE
Ketones, POC UA: NEGATIVE
Nitrite, UA: POSITIVE — AB
PH UA: 6
Spec Grav, UA: >=1.03
UROBILINOGEN UA: 0.2

## 2015-11-29 LAB — POC MICROSCOPIC URINALYSIS (UMFC): Mucus: ABSENT

## 2015-11-29 LAB — POCT URINE PREGNANCY: PREG TEST UR: NEGATIVE

## 2015-11-29 MED ORDER — FLUCONAZOLE 150 MG PO TABS: 150.0000 mg | ORAL_TABLET | Freq: Every day | ORAL | 0 refills | Status: DC

## 2015-11-29 MED ORDER — CIPROFLOXACIN HCL 500 MG PO TABS: 500.0000 mg | ORAL_TABLET | Freq: Two times a day (BID) | ORAL | 0 refills | Status: AC

## 2015-11-29 MED FILL — CIPROFLOXACIN HCL 500 MG TA: 500 | 5 days supply | Qty: 10 | Fill #0

## 2015-11-29 MED FILL — FLUCONAZOLE 150 MG TABLET: 150 | 2 days supply | Qty: 2 | Fill #0

## 2015-11-29 NOTE — Progress Notes (Signed)
11/29/2015 10:24 AM   DOB: January 02, 1971 / MRN: [id number]  SUBJECTIVE:  Margaret Fletcher is a 45 y.o. female presenting for dysuria that started about 3-4 days ago.  She reports some lower back pain on the right side as well.  Associates urinary frequency and urgency.  She denies any abnormal vaginal discharge.  She as a history of frequent UTI and reports this feels somewhat early in the normal course of UTI.  Denies fever.  Feels that she is getting worse.   She has a history of small bowel resection 2/2 to complications with bariatric surgery.    She is allergic to sulfa antibiotics and zosyn [piperacillin sod-tazobactam so].   She  has a past medical history of Alopecia; Anxiety; Autoimmune disease ([place]); Bacterial vaginosis; Cyst of spleen; Depression; Depression; Frequent UTI; Hypertrophy of breast; IIH (idiopathic intracranial hypertension) (2017); Migraine; and Migraine headache.    She  reports that she has never smoked. She has never used smokeless tobacco. She reports that she drinks alcohol. She reports that she does not use drugs. She  reports that she currently engages in sexual activity. She reports using the following method of birth control/protection: Surgical. The patient  has a past surgical history that includes Cesarean section; Cholecystectomy; Appendectomy; Cesarean section; Wisdom tooth extraction; Tubal ligation; Dilatation & currettage/hysteroscopy with novasure ablation (N/A, 07/19/2012); Endometrial ablation; Breath tek h pylori (N/A, 05/21/2014); Breast reduction surgery (Bilateral, 03/26/2015); Laparoscopic roux-en-y gastric bypass with hiatal hernia repair (N/A, 07/08/2015); Esophagogastroduodenoscopy (N/A, 08/16/2015); laparoscopy (N/A, 09/30/2015); Laparoscopic gastrostomy (N/A, 09/30/2015); Laparoscopic small bowel resection (N/A, 09/30/2015); and ir generic historical (10/04/2015).  Her family history includes Cancer in her mother; Diabetes in her brother; Seizures in her son;  Stroke in her father.  Review of Systems  Constitutional: Negative for chills and fever.  Cardiovascular: Negative for chest pain.  Gastrointestinal: Negative for nausea.  Genitourinary: Positive for dysuria, frequency and urgency. Negative for flank pain and hematuria.  Musculoskeletal: Positive for myalgias.  Skin: Negative for itching and rash.  Neurological: Negative for dizziness and headaches.  Psychiatric/Behavioral: Negative for depression.    The problem list and medications were reviewed and updated by myself where necessary and exist elsewhere in the encounter.   OBJECTIVE:  BP 116/78   Pulse 78   Temp 99.1 F (37.3 C) (Oral)   Resp 16   Ht 5\' 8"  (1.727 m)   Wt 215 lb (97.5 kg)   SpO2 98%   BMI 32.69 kg/m   Physical Exam  Constitutional: She appears well-developed and well-nourished.  Cardiovascular: Normal rate and regular rhythm.   Pulmonary/Chest: Effort normal and breath sounds normal.  Abdominal: Soft. Bowel sounds are normal. She exhibits no distension and no mass. There is no tenderness. There is no rebound, no guarding and no CVA tenderness.    Results for orders placed or performed in visit on 11/29/15 (from the past 72 hour(s))  POCT urine pregnancy     Status: None   Collection Time: 11/29/15 10:16 AM  Result Value Ref Range   Preg Test, Ur Negative Negative  POCT urinalysis dipstick     Status: Abnormal   Collection Time: 11/29/15 10:23 AM  Result Value Ref Range   Color, UA yellow yellow   Clarity, UA clear clear   Glucose, UA negative negative   Bilirubin, UA negative negative   Ketones, POC UA negative negative   Spec Grav, UA >=1.030    Blood, UA trace-lysed (A) negative   pH, UA 6.0  Protein Ur, POC trace (A) negative   Urobilinogen, UA 0.2    Nitrite, UA Positive (A) Negative   Leukocytes, UA small (1+) (A) Negative    No results found.  ASSESSMENT AND PLAN  [name] was seen today for dysuria and back pain.  Diagnoses and  all orders for this visit:  Dysuria -     POCT urinalysis dipstick -     POCT urine pregnancy -     POCT Microscopic Urinalysis ([facility]) -     ciprofloxacin (CIPRO) 500 MG tablet; Take 1 tablet (500 mg total) by mouth 2 (two) times daily.  Need for prophylactic vaccination and inoculation against influenza -     Flu Vaccine QUAD 36+ mos IM  Need for prophylactic measure -     fluconazole (DIFLUCAN) 150 MG tablet; Take 1 tablet (150 mg total) by mouth daily.    The patient is advised to call or return to clinic if she does not see an improvement in symptoms, or to seek the care of the closest emergency department if she worsens with the above plan.   Margaret Fletcher, MHS, PA-C [facility] 11/29/2015 10:24 AM

## 2015-11-29 NOTE — Patient Instructions (Signed)
     IF you received an x-ray today, you will receive an invoice from [facility]. Please contact [facility] at [phone] with questions or concerns regarding your invoice.   IF you received labwork today, you will receive an invoice from [facility]. Please contact [facility] at [phone] with questions or concerns regarding your invoice.   Our billing staff will not be able to assist you with questions regarding bills from these companies.  You will be contacted with the lab results as soon as they are available. The fastest way to get your results is to activate your My Chart account. Instructions are located on the last page of this paperwork. If you have not heard from us regarding the results in 2 weeks, please contact this office.      

## 2015-12-02 ENCOUNTER — Ambulatory Visit: Payer: 59 | Admitting: Dietician

## 2015-12-03 ENCOUNTER — Encounter: Payer: Self-pay | Admitting: Neurology

## 2015-12-03 ENCOUNTER — Ambulatory Visit (INDEPENDENT_AMBULATORY_CARE_PROVIDER_SITE_OTHER): Payer: 59 | Admitting: Neurology

## 2015-12-03 DIAGNOSIS — G932 Benign intracranial hypertension: Secondary | ICD-10-CM

## 2015-12-03 MED ORDER — TOPIRAMATE 100 MG PO TABS: 100.0000 mg | ORAL_TABLET | Freq: Every day | ORAL | 11 refills | Status: DC

## 2015-12-03 MED FILL — ONDANSETRON ODT 4 MG TABLET: 4 | 5 days supply | Qty: 20 | Fill #0

## 2015-12-03 MED FILL — TOPIRAMATE 100 MG TABLET: 100 | 30 days supply | Qty: 30 | Fill #0

## 2015-12-03 NOTE — Patient Instructions (Signed)
Remember to drink plenty of fluid, eat healthy meals and do not skip any meals. Try to eat protein with a every meal and eat a healthy snack such as fruit or nuts in between meals. Try to keep a regular sleep-wake schedule and try to exercise daily, particularly in the form of walking, 20-30 minutes a day, if you can.   As far as your medications are concerned, I would like to suggest: Decrease Topiramate to 150mg  daily then 100mg  daily. If Headaches worsen increase to last dose.   As far as diagnostic testing: None  I would like to see you back in 4 months, sooner if we need to. Please call us with any interim questions, concerns, problems, updates or refill requests.   Our phone number is [phone]. We also have an after hours call service for urgent matters and there is a physician on-call for urgent questions. For any emergencies you know to call 911 or go to the nearest emergency room

## 2015-12-03 NOTE — Progress Notes (Signed)
[id number]    Provider:  Dr Jaynee Eagles Referring Provider: Shawnee Knapp, MD Primary Care Physician:  [name], MD  Provider:  Dr Jaynee Eagles Primary Care Physician:  Merri Ray  CC: Headaches  Interval history 12/03/2015: She has lost 50 pounds. She is improving. She still has a peg tube. She has a lot of nausea when she eats and it is painful. It is improving. Her headaches are improved. She has had a UTI the last few weeks but previous to that her headaches were reduced to 3-5 a month but the last time she had a migraine was in August and the maxalt resolved it. She is having memory changes from the Topamax. She is having tingling. We can reduce the topiramate to 100mg  at night. Go to 150mg  for a week and then 100mg  at night. She has an appt with Midge Aver in ophtho in October.   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 She had worsening headaches recently. She takes imitrex acutely. Not sure if worsening headache due to recent stress, surgey complications, IIH or her migraines. Her IIH has been mostly under control, visual fields have been good, fundoscopic exam has been good. She is on Topamax 200mg  daily. She uses imitrex. She had a bad migraine last week. She has had 2 migraines in the last month, in between she has been ok. The propranolol has helped. We can increase the propranolol. The last migraines have been with ear pain. Encourage her to take the imitrex at the onset of the headache with zofran. Will change imitrex to nasal spray. Imitrex not working as well since the roux-en-Y. Discussed in detail.   Interval history 07/22/2015: She had the roux en y gastric bypass. She is having significant postoperative complications and she is crying in the office today. Her headaches have increased. The headaches are daily. The headaches are more behind the right eye with throbbing and light sensitivity, they sound more migrainous today than  pressure/IIH. The topamax doesn't help like it used to and she feels as though it is contributing to her significant memory changes and depression. She has pressure around the ears as well. Her ears will start hurting. More intense. Memory is terrible. Last visit with Dr Katy Fitch was all normal, visual fields normal. She is on Topamax 150mg  twice daily. She is feeling more depressed. She was feeling better on the Topamax but since the surgery the headaches are worse and memory worse and depression.   Interval update 06/03/2015: Patient with obesity and intracranial idiopathic hypertension here for follow up.Repeat MRI consistent with IIH (There is flattening of the pituitary gland within a mildly enlarged sella turcica and widening of the optic nerves sheaths) and elevated opening pressure on LP(23cm). She is currently On Topamax due to Sulfa allergy and can't take diamox. Discussed weight loss as integral to the management of her condition that can cause intractable headaches and permanent blindness. She is taking Topamax 200mg  only at night. She feels tired. She has some pins and needles in the arms and feet. She has headaches in the morning. She had a sleep study at [facility] within the last year that did not show OSA. She does not smoke. She is having smelling problems. She has throbbing headaches. She has vision changes. She has blurry vision. Her husband had a vascectomy and she had cervical abalation and she says she cannot have children at this time. Advised to be careful and  use backup due to teratogenicity of topamax. Discussed weight loss as the most important way to help with this disorder. If vision or headaches significantly worsen go directly to the ED.   Patient has a sulfa allergy cannot use diamox. Will increase Topamax to 200mg  and then may have to further increase as this is a weaker medication than acetazolamide. Will refer to Dr. Midge Aver for evaluation.   HPI: Margaret Fletcher  is a 45 y.o. female here as a referral from Dr. Marin Comment for headaches. PMHx morbid obesity, migraines, depression. She has had headaches since [facility], no inciting events or trauma. In the last several years they are unilateral, she gets unilateral ptosis, she has throbbing, light and noise sensitivity, nausea, dizziness, vertigo. She have worsening anosmia and food smells different, taste is affected. Worsening headaches. She has headache almost every day and maybe 4-5 a month that are severe and migrainous. Worsening over the last year. She takes Excedrin migraine almost daily. She gets unilateral vision changes, blurriness with the headaches. Worst headache can last 12 hours, 10/10, no vomiting. She has had to leave work early. She has right ear hearing changes. She had a sleep study completed at [facility] last year.   Reviewed notes, labs and imaging from outside physicians, which showed:  MRI of the brain 06/2011: Personally reviewed and agree with the following IMPRESSION: No acute intracranial findings.  No visible white matter disease, specifically no evidence for complicated migraine.  Abnormally increased amount of cerebrospinal fluid in the sella turcica with flattening of the pituitary gland against the floor.This appearance is consistent with a diagnosis of empty sella, although the relationship of this abnormality to the patient's clinical symptomatology is not established   Social History   Social History  . Marital status: Divorced    Spouse name: N/A  . Number of children: 2  . Years of education: 16   Occupational History  . [employer]- nurse tech    Social History Main Topics  . Smoking status: Never Smoker  . Smokeless tobacco: Never Used  . Alcohol use Yes     Comment: socially  . Drug use: No  . Sexual activity: Yes    Birth control/ protection: Surgical   Other Topics Concern  . Not on file   Social History Narrative   Lives with partner   Caffeine  use: minimal coffee    Family History  Problem Relation Age of Onset  . Cancer Mother   . Stroke Father   . Diabetes Brother   . Seizures Son   . Migraines Neg Hx     Past Medical History:  Diagnosis Date  . Alopecia   . Anxiety   . Autoimmune disease ([place])    "Alopecia"  . Bacterial vaginosis   . Cyst of spleen   . Depression   . Depression   . Frequent UTI   . Hypertrophy of breast   . IIH (idiopathic intracranial hypertension) 2017   "related to headaches"  . Migraine   . Migraine headache    denies, ruled out - Propranolol.Using for headaches    Past Surgical History:  Procedure Laterality Date  . APPENDECTOMY    . BREAST REDUCTION SURGERY Bilateral 03/26/2015   Procedure: BILATERAL BREAST REDUCTION  ;  Surgeon: Youlanda Roys, MD;  Location: [facility];  Service: Plastics;  Laterality: Bilateral;  . BREATH TEK H PYLORI N/A 05/21/2014   Procedure: BREATH TEK H PYLORI;  Surgeon: [name]  Redmond Pulling, MD;  Location: [facility];  Service: General;  Laterality: N/A;  . CESAREAN SECTION    . CESAREAN SECTION    . CHOLECYSTECTOMY    . DILITATION & CURRETTAGE/HYSTROSCOPY WITH NOVASURE ABLATION N/A 07/19/2012   Procedure: HYSTEROSCOPY WITH NOVASURE ABLATION;  Surgeon: Farrel Gobble. Harrington Challenger, MD;  Location: [facility];  Service: Gynecology;  Laterality: N/A;  . ENDOMETRIAL ABLATION    . ESOPHAGOGASTRODUODENOSCOPY N/A 08/16/2015   Procedure: UPPER ESOPHAGOGASTRODUODENOSCOPY (EGD);  Surgeon: Greer Pickerel, MD;  Location: [facility];  Service: General;  Laterality: N/A;  . IR GENERIC HISTORICAL  10/04/2015   IR Pana Vivianne Master 10/04/2015 Markus Daft, MD [facility]  . LAPAROSCOPIC GASTROSTOMY N/A 09/30/2015   Procedure: LAPAROSCOPIC GASTROSTOMY TUBE PLACEMENT;  Surgeon: Greer Pickerel, MD;  Location: [facility];  Service: General;  Laterality: N/A;  . LAPAROSCOPIC ROUX-EN-Y GASTRIC BYPASS WITH HIATAL HERNIA REPAIR N/A 07/08/2015   Procedure: LAPAROSCOPIC  ROUX-EN-Y GASTRIC BYPASS WITH HIATAL HERNIA REPAIR WITH UPPER ENDOSCOPY;  Surgeon: Greer Pickerel, MD;  Location: [facility];  Service: General;  Laterality: N/A;  . LAPAROSCOPIC SMALL BOWEL RESECTION N/A 09/30/2015   Procedure: LAPAROSCOPIC REVISION OF ROUX LIMB;  Surgeon: Greer Pickerel, MD;  Location: [facility];  Service: General;  Laterality: N/A;  . LAPAROSCOPY N/A 09/30/2015   Procedure: LAPAROSCOPY DIAGNOSTIC;  Surgeon: Greer Pickerel, MD;  Location: [facility];  Service: General;  Laterality: N/A;  . TUBAL LIGATION    . WISDOM TOOTH EXTRACTION      Current Outpatient Prescriptions  Medication Sig Dispense Refill  . ALPRAZolam (XANAX) 0.5 MG tablet Take 1 tablet (0.5 mg total) by mouth 2 (two) times daily as needed for anxiety. Can take 1-2 30 minutes before MRI. Do not drive. 30 tablet 1  . buPROPion (WELLBUTRIN XL) 300 MG 24 hr tablet Take 1 tablet (300 mg total) by mouth daily. 90 tablet 3  . CALCIUM-VITAMIN D PO Take 15 mLs by mouth 3 (three) times daily.    . ciprofloxacin (CIPRO) 500 MG tablet Take 1 tablet (500 mg total) by mouth 2 (two) times daily. 10 tablet 0  . Cyanocobalamin (VITAMIN B-12) 1000 MCG/15ML LIQD Take 5 mLs by mouth daily.    . fluconazole (DIFLUCAN) 150 MG tablet Take 1 tablet (150 mg total) by mouth daily. 2 tablet 0  . lactulose (CHRONULAC) 10 GM/15ML solution Take 10 g by mouth daily as needed for mild constipation.    . Multiple Vitamin (MULTIVITAMIN) LIQD Take 5 mLs by mouth 2 (two) times daily.    . Nutritional Supplements (FEEDING SUPPLEMENT, VITAL HIGH PROTEIN,) LIQD liquid Place 1,000 mLs into feeding tube daily. 58ml/hr for 8hrs a day as needed for poor oral intake; 300 ml free water q4hr 1000 mL 8  . ondansetron (ZOFRAN-ODT) 4 MG disintegrating tablet Take 1 tablet (4 mg total) by mouth every 6 (six) hours as needed for nausea. 30 tablet 0  . oxyCODONE (ROXICODONE) 5 MG/5ML solution Take 2.5 mLs (2.5 mg total) by mouth every 6 (six) hours as needed for moderate pain or  severe pain. 60 mL 0  . pantoprazole (PROTONIX) 40 MG tablet Take 40 mg by mouth daily.  1  . polyethylene glycol (MIRALAX / GLYCOLAX) packet Take 17 g by mouth daily. 14 each 0  . simethicone (MYLICON) 80 MG chewable tablet Chew 80 mg by mouth every 6 (six) hours as needed for flatulence.    . topiramate (TOPAMAX) 100 MG tablet Take 2 tablets (200 mg total) by mouth at bedtime. (  Patient taking differently: Take 100 mg by mouth 2 (two) times daily. ) 60 tablet 11  . rizatriptan (MAXALT-MLT) 10 MG disintegrating tablet Take 1 tablet (10 mg total) by mouth once. May repeat in 2 hours if needed 9 tablet 11   No current facility-administered medications for this visit.     Allergies as of 12/03/2015 - Review Complete 12/03/2015  Allergen Reaction Noted  . Sulfa antibiotics Anaphylaxis 01/18/2011  . Zosyn [piperacillin sod-tazobactam so] Rash 09/12/2015    Vitals: BP 130/86 (BP Location: Right Arm, Patient Position: Sitting, Cuff Size: Normal)   Pulse 74   Ht 5\' 8"  (1.727 m)   Wt 217 lb 9.6 oz (98.7 kg)   BMI 33.09 kg/m  Last Weight:  Wt Readings from Last 1 Encounters:  12/03/15 217 lb 9.6 oz (98.7 kg)   Last Height:   Ht Readings from Last 1 Encounters:  12/03/15 5\' 8"  (1.727 m)     Speech:  Speech is normal; fluent and spontaneous with normal comprehension.  Cognition:  The patient is oriented to person, place, and time;   recent and remote memory intact;   language fluent;   normal attention, concentration,   fund of knowledge Cranial Nerves:  The pupils are equal, round, and reactive to light. The fundi are normal and spontaneous venous pulsations are present. Visual fields are full to finger confrontation. Extraocular movements are intact. Trigeminal sensation is intact and the muscles of mastication are normal. The face is symmetric. The palate elevates in the midline. Hearing intact. Voice is normal. Shoulder shrug is normal. The tongue has normal motion  without fasciculations.   Coordination:  Normal finger to nose and heel to shin. Normal rapid alternating movements.   Gait:  Heel-toe and tandem gait are normal.   Motor Observation:  No asymmetry, no atrophy, and no involuntary movements noted. Tone:  Normal muscle tone.   Posture:  Posture is normal. normal erect   Strength:  Strength is V/V in the upper and lower limbs.    Sensation: intact to LT   Reflex Exam:  DTR's:  Deep tendon reflexes in the upper and lower extremities are normal bilaterally.  Toes:  The toes are downgoing bilaterally.  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 Repeat MRI consistent with IIH. She also has a history of migraines. She is allergic to sulfa drugs and therefore cannot use Diamox. S/p roux-en-y   Decrease  topamax to 100mg  at night due to worsening fatigue, memory problems, depression (all of which can be caused by Topamax)- and watch for headache progression. Decrease to 150mg  then 100mg  at night.   Some Medication Overuse with Excedrin daily. Advised no more than 2-3x a week.  Maxalt  at the onset. Take with zofran.  [place] prn as well for headaches and migraines   Lumbar puncture - opening pressure 23 MRi of the brain w/wo contrast - c/w IIH Topiramate - decreased to 200 mg at last appointment daily at bedtime given worsening memory and depression (funduscopic exam and visual fields have been normal) She has an anaphylactic allergy to sulfa, cannot use diamox. Follow up with Dr. Katy Fitch Discussed [place] and risk of permanent vision loss, proceed to ED directly if vision changes  Sarina Ill, MD  [facility] [address] [place], [place] [address]  Phone [phone] Fax [phone]  A total of 30  minutes was spent face-to-face with this patient. Over half this time was spent on counseling patient on the IIH and migraine diagnosis and different diagnostic and therapeutic options available.

## 2015-12-06 MED FILL — oxyCODONE HCL 5 MG/5ML SOLN: 5 | 15 days supply | Qty: 75 | Fill #0

## 2015-12-11 DIAGNOSIS — N62 Hypertrophy of breast: Secondary | ICD-10-CM | POA: Diagnosis not present

## 2015-12-14 DIAGNOSIS — R5382 Chronic fatigue, unspecified: Secondary | ICD-10-CM | POA: Insufficient documentation

## 2015-12-14 DIAGNOSIS — R413 Other amnesia: Secondary | ICD-10-CM | POA: Insufficient documentation

## 2015-12-24 DIAGNOSIS — E43 Unspecified severe protein-calorie malnutrition: Secondary | ICD-10-CM | POA: Diagnosis not present

## 2015-12-24 DIAGNOSIS — E441 Mild protein-calorie malnutrition: Secondary | ICD-10-CM | POA: Diagnosis not present

## 2015-12-27 ENCOUNTER — Encounter: Payer: Self-pay | Admitting: Neurology

## 2016-01-14 MED FILL — LACTULOSE 10 GM/15 ML SOLN: 10 | 30 days supply | Qty: 60 | Fill #0

## 2016-01-14 MED FILL — TOPIRAMATE 100 MG TABLET: 100 | 30 days supply | Qty: 30 | Fill #1

## 2016-01-15 ENCOUNTER — Encounter: Payer: Self-pay | Admitting: Neurology

## 2016-01-17 ENCOUNTER — Encounter: Payer: Self-pay | Admitting: Family Medicine

## 2016-01-17 DIAGNOSIS — K912 Postsurgical malabsorption, not elsewhere classified: Secondary | ICD-10-CM | POA: Diagnosis not present

## 2016-01-17 DIAGNOSIS — G932 Benign intracranial hypertension: Secondary | ICD-10-CM | POA: Diagnosis not present

## 2016-01-17 DIAGNOSIS — R7303 Prediabetes: Secondary | ICD-10-CM | POA: Diagnosis not present

## 2016-01-17 DIAGNOSIS — Z9884 Bariatric surgery status: Secondary | ICD-10-CM | POA: Diagnosis not present

## 2016-01-21 ENCOUNTER — Ambulatory Visit (INDEPENDENT_AMBULATORY_CARE_PROVIDER_SITE_OTHER): Payer: 59 | Admitting: Physician Assistant

## 2016-01-21 VITALS — BP 122/72 | HR 66 | Temp 98.0°F | Resp 17 | Ht 68.5 in | Wt 205.0 lb

## 2016-01-21 DIAGNOSIS — Z0189 Encounter for other specified special examinations: Secondary | ICD-10-CM

## 2016-01-21 DIAGNOSIS — Z9884 Bariatric surgery status: Secondary | ICD-10-CM

## 2016-01-21 NOTE — Progress Notes (Signed)
Margaret Fletcher  MRN: [id number] DOB: February 10, 1971  Subjective:  Margaret Fletcher is a 45 y.o. female seen in office today for a chief complaint of request of lab work from Education officer, environmental. Her surgeon is Dr. Greer Pickerel at [facility]. Pt has H/o laparoscopic Roux-en-Y gastric bypass in May 2017, laparoscopic revision of Roux limb, laparoscopic placement of a 24 French gastrotomy tube and excluded stomach on September 30 2015.  Her last follow up with Dr. Redmond Pulling was on 01/17/16 and he requested that she have her labs drawn at her PCP office and have them resulted to him. Her next appointment with him is 03/2015.   She takes multivitamin, B12, and calcium liquid supplements daily.   Review of Systems  Constitutional: Negative for chills, diaphoresis and fever.  Gastrointestinal: Positive for abdominal pain (ongoing pain ), constipation (takes pain meds prn and does use a stool softener when she takes pain meds) and nausea (ongoing; has it with smells and food). Negative for diarrhea and vomiting.    Patient Active Problem List   Diagnosis Date Noted  . Protein-calorie malnutrition, severe 10/08/2015  . Mild protein-calorie malnutrition ([place]) 10/07/2015  . Bacteremia 09/12/2015  . Protein-calorie malnutrition, mild ([place]) 08/19/2015  . Prediabetes 07/08/2015  . Morbid obesity ([place]) 07/08/2015  . Mild obstructive sleep apnea 07/08/2015  . Dyslipidemia 07/08/2015  . S/P gastric bypass 07/08/2015  . IIH (idiopathic intracranial hypertension) 06/03/2015  . Chronic migraine without aura or status migrainosus 04/30/2015  . Worsening headaches 04/30/2015  . Vision changes 04/30/2015  . Perceived hearing changes 04/30/2015  . Liver lesion 03/21/2013  . Splenic cyst 03/21/2013  . Abdominal pain 03/21/2013    Current Outpatient Prescriptions on File Prior to Visit  Medication Sig Dispense Refill  . ALPRAZolam (XANAX) 0.5 MG tablet Take 1 tablet (0.5 mg total) by mouth 2 (two) times  daily as needed for anxiety. Can take 1-2 30 minutes before MRI. Do not drive. 30 tablet 1  . buPROPion (WELLBUTRIN XL) 300 MG 24 hr tablet Take 1 tablet (300 mg total) by mouth daily. 90 tablet 3  . CALCIUM-VITAMIN D PO Take 15 mLs by mouth 3 (three) times daily.    . Cyanocobalamin (VITAMIN B-12) 1000 MCG/15ML LIQD Take 5 mLs by mouth daily.    . fluconazole (DIFLUCAN) 150 MG tablet Take 1 tablet (150 mg total) by mouth daily. 2 tablet 0  . lactulose (CHRONULAC) 10 GM/15ML solution Take 10 g by mouth daily as needed for mild constipation.    . Multiple Vitamin (MULTIVITAMIN) LIQD Take 5 mLs by mouth 2 (two) times daily.    . Nutritional Supplements (FEEDING SUPPLEMENT, VITAL HIGH PROTEIN,) LIQD liquid Place 1,000 mLs into feeding tube daily. 76ml/hr for 8hrs a day as needed for poor oral intake; 300 ml free water q4hr 1000 mL 8  . ondansetron (ZOFRAN-ODT) 4 MG disintegrating tablet Take 1 tablet (4 mg total) by mouth every 6 (six) hours as needed for nausea. 30 tablet 0  . oxyCODONE (ROXICODONE) 5 MG/5ML solution Take 2.5 mLs (2.5 mg total) by mouth every 6 (six) hours as needed for moderate pain or severe pain. 60 mL 0  . pantoprazole (PROTONIX) 40 MG tablet Take 40 mg by mouth daily.  1  . polyethylene glycol (MIRALAX / GLYCOLAX) packet Take 17 g by mouth daily. 14 each 0  . simethicone (MYLICON) 80 MG chewable tablet Chew 80 mg by mouth every 6 (six) hours as needed for flatulence.    Marland Kitchen  topiramate (TOPAMAX) 100 MG tablet Take 1 tablet (100 mg total) by mouth at bedtime. 30 tablet 11  . rizatriptan (MAXALT-MLT) 10 MG disintegrating tablet Take 1 tablet (10 mg total) by mouth once. May repeat in 2 hours if needed 9 tablet 11   No current facility-administered medications on file prior to visit.     Allergies  Allergen Reactions  . Sulfa Antibiotics Anaphylaxis  . Zosyn [Piperacillin Sod-Tazobactam So] Rash     Objective:  BP 122/72 (BP Location: Right Arm, Patient Position: Sitting, Cuff  Size: Normal)   Pulse 66   Temp 98 F (36.7 C) (Oral)   Resp 17   Ht 5' 8.5" (1.74 m)   Wt 205 lb (93 kg)   SpO2 99%   BMI 30.72 kg/m   Physical Exam  Constitutional: She is oriented to person, place, and time and well-developed, well-nourished, and in no distress.  HENT:  Head: Normocephalic and atraumatic.  Eyes: Conjunctivae are normal.  Neck: Normal range of motion.  Cardiovascular: Normal rate, regular rhythm and normal heart sounds.   Pulmonary/Chest: Effort normal.  Abdominal:    Neurological: She is alert and oriented to person, place, and time. Gait normal.  Skin: Skin is warm and dry.  Psychiatric: Affect normal.  Vitals reviewed.   Assessment and Plan :   1. S/P gastric bypass -Labs requested by Dr. Redmond Pulling were collected and will be directly resulted to his office. -Pt instructed to follow up with Dr.Wilson for her next appointment.   Tenna Delaine PA-C  [facility] 01/21/2016 12:55 PM

## 2016-01-21 NOTE — Patient Instructions (Addendum)
Your labs will be resulted to Dr. Redmond Pulling so he should contact you with these.  Thank you for letting me participate in your health and well being.  Good luck with everything!    IF you received an x-ray today, you will receive an invoice from [facility]. Please contact [facility] at [phone] with questions or concerns regarding your invoice.   IF you received labwork today, you will receive an invoice from [employer]. Please contact [facility] at [phone] with questions or concerns regarding your invoice.   Our billing staff will not be able to assist you with questions regarding bills from these companies.  You will be contacted with the lab results as soon as they are available. The fastest way to get your results is to activate your My Chart account. Instructions are located on the last page of this paperwork. If you have not heard from [name] regarding the results in 2 weeks, please contact this office.

## 2016-01-24 ENCOUNTER — Telehealth: Payer: Self-pay | Admitting: Family Medicine

## 2016-01-24 DIAGNOSIS — D259 Leiomyoma of uterus, unspecified: Secondary | ICD-10-CM | POA: Diagnosis not present

## 2016-01-24 DIAGNOSIS — R1031 Right lower quadrant pain: Secondary | ICD-10-CM | POA: Diagnosis not present

## 2016-01-24 NOTE — Telephone Encounter (Signed)
lmom to call back and ask for [name]

## 2016-01-24 NOTE — Telephone Encounter (Signed)
Pt calling about lab results Dr Redmond Pulling office is waiting on them please respond

## 2016-01-24 NOTE — Telephone Encounter (Signed)
Margaret Fletcher has taken care of this. Please let me know if there is any other questions. Thanks!

## 2016-01-30 ENCOUNTER — Emergency Department (HOSPITAL_COMMUNITY): Payer: 59

## 2016-01-30 ENCOUNTER — Emergency Department (HOSPITAL_COMMUNITY)
Admission: EM | Admit: 2016-01-30 | Discharge: 2016-01-30 | Disposition: A | Discharge status: Home / Self Care | Payer: 59 | Attending: Emergency Medicine | Admitting: Emergency Medicine

## 2016-01-30 ENCOUNTER — Encounter (HOSPITAL_COMMUNITY): Payer: Self-pay | Admitting: Emergency Medicine

## 2016-01-30 DIAGNOSIS — R1013 Epigastric pain: Secondary | ICD-10-CM | POA: Diagnosis not present

## 2016-01-30 DIAGNOSIS — Z931 Gastrostomy status: Secondary | ICD-10-CM

## 2016-01-30 DIAGNOSIS — K9423 Gastrostomy malfunction: Secondary | ICD-10-CM | POA: Insufficient documentation

## 2016-01-30 DIAGNOSIS — Z79899 Other long term (current) drug therapy: Secondary | ICD-10-CM | POA: Diagnosis not present

## 2016-01-30 DIAGNOSIS — R109 Unspecified abdominal pain: Secondary | ICD-10-CM | POA: Diagnosis not present

## 2016-01-30 DIAGNOSIS — T85848A Pain due to other internal prosthetic devices, implants and grafts, initial encounter: Secondary | ICD-10-CM

## 2016-01-30 DIAGNOSIS — Z4682 Encounter for fitting and adjustment of non-vascular catheter: Secondary | ICD-10-CM | POA: Diagnosis not present

## 2016-01-30 LAB — COMPREHENSIVE METABOLIC PANEL
ALBUMIN: 3.6 g/dL (ref 3.5–5.0)
ALK PHOS: 57 U/L (ref 38–126)
ALT: 13 U/L — ABNORMAL LOW (ref 14–54)
ANION GAP: 5 (ref 5–15)
AST: 16 U/L (ref 15–41)
BUN: 16 mg/dL (ref 6–20)
CALCIUM: 8.6 mg/dL — AB (ref 8.9–10.3)
CHLORIDE: 106 mmol/L (ref 101–111)
CO2: 28 mmol/L (ref 22–32)
Creatinine, Ser: 1.17 mg/dL — ABNORMAL HIGH (ref 0.44–1.00)
GFR calc non Af Amer: 55 mL/min — ABNORMAL LOW (ref >60)
GLUCOSE: 81 mg/dL (ref 65–99)
POTASSIUM: 3.9 mmol/L (ref 3.5–5.1)
SODIUM: 139 mmol/L (ref 135–145)
Total Bilirubin: 0.6 mg/dL (ref 0.3–1.2)
Total Protein: 6.8 g/dL (ref 6.5–8.1)

## 2016-01-30 LAB — CBC WITH DIFFERENTIAL/PLATELET
BASOS PCT: 1 %
Basophils Absolute: 0.1 10*3/uL (ref 0.0–0.1)
Eosinophils Absolute: 0.1 10*3/uL (ref 0.0–0.7)
Eosinophils Relative: 2 %
HEMATOCRIT: 37.3 % (ref 36.0–46.0)
HEMOGLOBIN: 12.3 g/dL (ref 12.0–15.0)
LYMPHS ABS: 1.8 10*3/uL (ref 0.7–4.0)
LYMPHS PCT: 25 %
MCH: 30.6 pg (ref 26.0–34.0)
MCHC: 33 g/dL (ref 30.0–36.0)
MCV: 92.8 fL (ref 78.0–100.0)
MONO ABS: 0.5 10*3/uL (ref 0.1–1.0)
MONOS PCT: 7 %
NEUTROS ABS: 4.5 10*3/uL (ref 1.7–7.7)
NEUTROS PCT: 65 %
Platelets: 343 10*3/uL (ref 150–400)
RBC: 4.02 MIL/uL (ref 3.87–5.11)
RDW: 14.1 % (ref 11.5–15.5)
WBC: 7 10*3/uL (ref 4.0–10.5)

## 2016-01-30 LAB — URINALYSIS, ROUTINE W REFLEX MICROSCOPIC
BILIRUBIN URINE: NEGATIVE
Glucose, UA: NEGATIVE mg/dL
HGB URINE DIPSTICK: NEGATIVE
Ketones, ur: NEGATIVE mg/dL
Leukocytes, UA: NEGATIVE
Nitrite: NEGATIVE
PH: 5.5 (ref 5.0–8.0)
Protein, ur: NEGATIVE mg/dL
SPECIFIC GRAVITY, URINE: 1.015 (ref 1.005–1.030)

## 2016-01-30 LAB — POC URINE PREG, ED: PREG TEST UR: NEGATIVE

## 2016-01-30 LAB — LIPASE, BLOOD: Lipase: 21 U/L (ref 11–51)

## 2016-01-30 MED ORDER — IOPAMIDOL (ISOVUE-300) INJECTION 61%
INTRAVENOUS | Status: AC
  Filled 2016-01-30: qty 100

## 2016-01-30 MED ORDER — SODIUM CHLORIDE 0.9 % IJ SOLN
INTRAMUSCULAR | Status: AC
  Filled 2016-01-30: qty 50

## 2016-01-30 MED ORDER — ALPRAZOLAM 0.5 MG PO TABS
0.5000 mg | ORAL_TABLET | Freq: Once | ORAL | Status: AC
  Administered 2016-01-30: 0.5 mg via ORAL
  Filled 2016-01-30: qty 1

## 2016-01-30 MED ORDER — IOPAMIDOL (ISOVUE-300) INJECTION 61%
INTRAVENOUS | Status: AC
  Administered 2016-01-30: 30 mL via GASTROSTOMY
  Filled 2016-01-30: qty 50

## 2016-01-30 MED ORDER — HYDROMORPHONE HCL 1 MG/ML IJ SOLN
1.0000 mg | Freq: Once | INTRAMUSCULAR | Status: AC
  Administered 2016-01-30: 1 mg via INTRAVENOUS
  Filled 2016-01-30: qty 1

## 2016-01-30 MED ORDER — HYDROMORPHONE HCL 1 MG/ML IJ SOLN
0.5000 mg | Freq: Once | INTRAMUSCULAR | Status: AC
  Administered 2016-01-30: 0.5 mg via INTRAVENOUS
  Filled 2016-01-30: qty 1

## 2016-01-30 MED ORDER — ONDANSETRON HCL 4 MG/2ML IJ SOLN
4.0000 mg | Freq: Once | INTRAMUSCULAR | Status: AC
  Administered 2016-01-30: 4 mg via INTRAVENOUS
  Filled 2016-01-30: qty 2

## 2016-01-30 MED ORDER — ONDANSETRON HCL 4 MG/2ML IJ SOLN
4.0000 mg | Freq: Once | INTRAMUSCULAR | Status: AC
Start: 2016-01-30 — End: 2016-01-30
  Administered 2016-01-30: 4 mg via INTRAVENOUS
  Filled 2016-01-30: qty 2

## 2016-01-30 MED ORDER — LIDOCAINE HCL 2 % EX GEL
1.0000 "application " | Freq: Once | CUTANEOUS | Status: AC
  Administered 2016-01-30: 1 via TOPICAL
  Filled 2016-01-30: qty 11

## 2016-01-30 MED ORDER — IOPAMIDOL (ISOVUE-300) INJECTION 61%
30.0000 mL | Freq: Once | INTRAVENOUS | Status: AC | PRN
  Administered 2016-01-30: 30 mL via ORAL

## 2016-01-30 MED ORDER — MORPHINE SULFATE (PF) 4 MG/ML IV SOLN
4.0000 mg | Freq: Once | INTRAVENOUS | Status: AC
  Administered 2016-01-30: 4 mg via INTRAVENOUS
  Filled 2016-01-30: qty 1

## 2016-01-30 MED ORDER — IOPAMIDOL (ISOVUE-300) INJECTION 61%
100.0000 mL | Freq: Once | INTRAVENOUS | Status: AC | PRN
  Administered 2016-01-30: 100 mL via INTRAVENOUS

## 2016-01-30 NOTE — ED Notes (Signed)
ED Provider at bedside. 

## 2016-01-30 NOTE — ED Notes (Signed)
Patient given ice chips. 

## 2016-01-30 NOTE — ED Notes (Signed)
Patient transported to CT 

## 2016-01-30 NOTE — Discharge Instructions (Signed)
Please call the number I gave you to have IR procedure tomorrow. Please stay npo after midnight. Return to the Ed if your symptoms worsen. Continue your pain medicine at home.

## 2016-01-30 NOTE — ED Notes (Signed)
Patient transported to X-ray 

## 2016-01-30 NOTE — ED Notes (Signed)
Patient requesting pain medication-PA made aware. 

## 2016-01-30 NOTE — ED Notes (Signed)
Upon patient's arrival from CT, patient reports to this RN that her feeding tube is completley out. PA made aware.

## 2016-01-30 NOTE — ED Triage Notes (Signed)
Pt sts she went to get shower this morning and noticed her G tube was sticking out much farther than her insertion site. Pt sts tube is still intact, site is extremely sore. Pt hasn't had pain at site for awhile but last night had to take some pain medication due to pain at site. Pt A&Ox4 and ambulatory. No Other complaints at this time.

## 2016-01-30 NOTE — ED Provider Notes (Signed)
[facility] DEPT Provider Note   CSN: [id number] Arrival date & time: 01/30/16  1241     History   Chief Complaint Chief Complaint  Patient presents with  . Feeding Tube Issue    HPI Margaret Fletcher is a 45 y.o. female.  60 showed African-American female with a past medical history significant for laparoscopic roux-en-y gastric bypass with hiatal hernia repair  7/24, cholecystectomy, intracranial hypertension, gastric bypass that presents to the ED today with feeding tube issues. Patient states that she was getting out of the shower this morning and pulled on her G-tube which came out approximately 2 inches from the site of insertion. Patient also reports soreness around the tube site. Patient states her pain has been to a minimal for the past 3 months. Patient states pain started approximate one day ago has gradually worsened. she also notes mild swelling around the site of the tube. Denies any drainage or fevers. She notes associated nausea, swelling around the site of her tube and pain that radiates to the left upper abdomen and her lower back with history of same. Pt states she has taken oxycodone with no relief. She denies fever. No urinary symptoms. Denies any fever, chills, headache, vision changes, lightheadedness, dizziness, cough, chest pain, shortness of breath, urinary symptoms, change in bowel habits, numbness/tingling.         Past Medical History:  Diagnosis Date  . Alopecia   . Anxiety   . Autoimmune disease ([place])    "Alopecia"  . Bacterial vaginosis   . Cyst of spleen   . Depression   . Depression   . Frequent UTI   . Hypertrophy of breast   . IIH (idiopathic intracranial hypertension) 2017   "related to headaches"  . Migraine   . Migraine headache    denies, ruled out - Propranolol.Using for headaches    Patient Active Problem List   Diagnosis Date Noted  . Protein-calorie malnutrition, severe 10/08/2015  . Mild protein-calorie malnutrition  ([place]) 10/07/2015  . Bacteremia 09/12/2015  . Protein-calorie malnutrition, mild ([place]) 08/19/2015  . Prediabetes 07/08/2015  . Morbid obesity ([place]) 07/08/2015  . Mild obstructive sleep apnea 07/08/2015  . Dyslipidemia 07/08/2015  . S/P gastric bypass 07/08/2015  . IIH (idiopathic intracranial hypertension) 06/03/2015  . Chronic migraine without aura or status migrainosus 04/30/2015  . Worsening headaches 04/30/2015  . Vision changes 04/30/2015  . Perceived hearing changes 04/30/2015  . Liver lesion 03/21/2013  . Splenic cyst 03/21/2013  . Abdominal pain 03/21/2013    Past Surgical History:  Procedure Laterality Date  . APPENDECTOMY    . BREAST REDUCTION SURGERY Bilateral 03/26/2015   Procedure: BILATERAL BREAST REDUCTION  ;  Surgeon: Youlanda Roys, MD;  Location: [facility];  Service: Plastics;  Laterality: Bilateral;  . BREATH TEK H PYLORI N/A 05/21/2014   Procedure: BREATH TEK H PYLORI;  Surgeon: Greer Pickerel, MD;  Location: [facility];  Service: General;  Laterality: N/A;  . CESAREAN SECTION    . CESAREAN SECTION    . CHOLECYSTECTOMY    . DILITATION & CURRETTAGE/HYSTROSCOPY WITH NOVASURE ABLATION N/A 07/19/2012   Procedure: HYSTEROSCOPY WITH NOVASURE ABLATION;  Surgeon: Farrel Gobble. Harrington Challenger, MD;  Location: [facility];  Service: Gynecology;  Laterality: N/A;  . ENDOMETRIAL ABLATION    . ESOPHAGOGASTRODUODENOSCOPY N/A 08/16/2015   Procedure: UPPER ESOPHAGOGASTRODUODENOSCOPY (EGD);  Surgeon: Greer Pickerel, MD;  Location: [facility];  Service: General;  Laterality: N/A;  . IR GENERIC HISTORICAL  10/04/2015   IR [facility]  GASTRO/COLONIC TUBE PERCUT W/FLUORO 10/04/2015 Markus Daft, MD [facility]  . LAPAROSCOPIC GASTROSTOMY N/A 09/30/2015   Procedure: LAPAROSCOPIC GASTROSTOMY TUBE PLACEMENT;  Surgeon: Greer Pickerel, MD;  Location: [facility];  Service: General;  Laterality: N/A;  . LAPAROSCOPIC ROUX-EN-Y GASTRIC BYPASS WITH HIATAL HERNIA REPAIR N/A 07/08/2015   Procedure: LAPAROSCOPIC  ROUX-EN-Y GASTRIC BYPASS WITH HIATAL HERNIA REPAIR WITH UPPER ENDOSCOPY;  Surgeon: Greer Pickerel, MD;  Location: [facility];  Service: General;  Laterality: N/A;  . LAPAROSCOPIC SMALL BOWEL RESECTION N/A 09/30/2015   Procedure: LAPAROSCOPIC REVISION OF ROUX LIMB;  Surgeon: Greer Pickerel, MD;  Location: [facility];  Service: General;  Laterality: N/A;  . LAPAROSCOPY N/A 09/30/2015   Procedure: LAPAROSCOPY DIAGNOSTIC;  Surgeon: Greer Pickerel, MD;  Location: [facility];  Service: General;  Laterality: N/A;  . TUBAL LIGATION    . WISDOM TOOTH EXTRACTION      OB History    No data available       Home Medications    Prior to Admission medications   Medication Sig Start Date End Date Taking? Authorizing Provider  ALPRAZolam Duanne Moron) 0.5 MG tablet Take 1 tablet (0.5 mg total) by mouth 2 (two) times daily as needed for anxiety. Can take 1-2 30 minutes before MRI. Do not drive. 07/04/15   Robyn Haber, MD  buPROPion (WELLBUTRIN XL) 300 MG 24 hr tablet Take 1 tablet (300 mg total) by mouth daily. 07/04/15   Robyn Haber, MD  CALCIUM-VITAMIN D PO Take 15 mLs by mouth 3 (three) times daily.    Historical Provider, MD  Cyanocobalamin (VITAMIN B-12) 1000 MCG/15ML LIQD Take 5 mLs by mouth daily.    Historical Provider, MD  fluconazole (DIFLUCAN) 150 MG tablet Take 1 tablet (150 mg total) by mouth daily. 11/29/15   Tereasa Coop, PA-C  lactulose (CHRONULAC) 10 GM/15ML solution Take 10 g by mouth daily as needed for mild constipation.    Historical Provider, MD  Multiple Vitamin (MULTIVITAMIN) LIQD Take 5 mLs by mouth 2 (two) times daily.    Historical Provider, MD  Nutritional Supplements (FEEDING SUPPLEMENT, VITAL HIGH PROTEIN,) LIQD liquid Place 1,000 mLs into feeding tube daily. 59ml/hr for 8hrs a day as needed for poor oral intake; 300 ml free water q4hr 10/09/15   Greer Pickerel, MD  ondansetron (ZOFRAN-ODT) 4 MG disintegrating tablet Take 1 tablet (4 mg total) by mouth every 6 (six) hours as needed for nausea. 07/19/15    Greer Pickerel, MD  oxyCODONE (ROXICODONE) 5 MG/5ML solution Take 2.5 mLs (2.5 mg total) by mouth every 6 (six) hours as needed for moderate pain or severe pain. 10/07/15   Greer Pickerel, MD  pantoprazole (PROTONIX) 40 MG tablet Take 40 mg by mouth daily. 07/19/15   Historical Provider, MD  polyethylene glycol (MIRALAX / GLYCOLAX) packet Take 17 g by mouth daily. 10/09/15   Greer Pickerel, MD  rizatriptan (MAXALT-MLT) 10 MG disintegrating tablet Take 1 tablet (10 mg total) by mouth once. May repeat in 2 hours if needed 10/11/15 10/23/15  Melvenia Beam, MD  simethicone (MYLICON) 80 MG chewable tablet Chew 80 mg by mouth every 6 (six) hours as needed for flatulence.    Historical Provider, MD  topiramate (TOPAMAX) 100 MG tablet Take 1 tablet (100 mg total) by mouth at bedtime. 12/03/15   Melvenia Beam, MD    Family History Family History  Problem Relation Age of Onset  . Cancer Mother   . Stroke Father   . Diabetes Brother   . Seizures Son   .  Migraines Neg Hx     Social History Social History  Substance Use Topics  . Smoking status: Never Smoker  . Smokeless tobacco: Never Used  . Alcohol use Yes     Comment: socially     Allergies   Sulfa antibiotics and Zosyn [piperacillin sod-tazobactam so]   Review of Systems Review of Systems  Constitutional: Negative for chills and fever.  HENT: Negative for congestion, ear pain and sore throat.   Eyes: Negative for pain and visual disturbance.  Respiratory: Negative for cough and shortness of breath.   Cardiovascular: Negative for chest pain and palpitations.  Gastrointestinal: Positive for abdominal pain, constipation, nausea and vomiting.  Genitourinary: Negative for dysuria, flank pain, frequency, hematuria and urgency.  Musculoskeletal: Negative for arthralgias and back pain.  Skin: Negative for color change and rash.  Neurological: Negative for dizziness, syncope, weakness, light-headedness and headaches.  All other systems reviewed and  are negative.    Physical Exam Updated Vital Signs BP 125/85   Pulse 60   Temp 98 F (36.7 C) (Oral)   Resp 16   SpO2 100%   Physical Exam  Constitutional: She appears well-developed and well-nourished. No distress.  HENT:  Head: Normocephalic and atraumatic.  Mouth/Throat: Oropharynx is clear and moist.  Eyes: Conjunctivae are normal. Right eye exhibits no discharge. Left eye exhibits no discharge. No scleral icterus.  Neck: Normal range of motion. Neck supple. No thyromegaly present.  Cardiovascular: Normal rate, regular rhythm, normal heart sounds and intact distal pulses.  Exam reveals no gallop and no friction rub.   No murmur heard. Pulmonary/Chest: Effort normal and breath sounds normal.  Abdominal: Soft. Bowel sounds are normal. She exhibits no distension. There is tenderness in the epigastric area and left upper quadrant. There is guarding (voluntary). There is no rigidity, no rebound and no CVA tenderness.  Gastric tube in place. No bleeding or purulent drainage from the site. Pain with any manipulation. No surrounding redness.   Musculoskeletal: Normal range of motion.  Lymphadenopathy:    She has no cervical adenopathy.  Neurological: She is alert.  Skin: Skin is warm and dry. Capillary refill takes less than 2 seconds.  Nursing note and vitals reviewed.    ED Treatments / Results  Labs (all labs ordered are listed, but only abnormal results are displayed) Labs Reviewed  COMPREHENSIVE METABOLIC PANEL - Abnormal; Notable for the following:       Result Value   Creatinine, Ser 1.17 (*)    Calcium 8.6 (*)    ALT 13 (*)    GFR calc non Af Amer 55 (*)    All other components within normal limits  URINALYSIS, ROUTINE W REFLEX MICROSCOPIC (NOT AT [facility]) - Abnormal; Notable for the following:    APPearance HAZY (*)    All other components within normal limits  CBC WITH DIFFERENTIAL/PLATELET  LIPASE, BLOOD  POC URINE PREG, ED    EKG  EKG Interpretation None         Radiology Ct Abdomen Pelvis W Contrast  Result Date: 01/30/2016 CLINICAL DATA:  G-tube sticking out but painful EXAM: CT ABDOMEN AND PELVIS WITH CONTRAST TECHNIQUE: Multidetector CT imaging of the abdomen and pelvis was performed using the standard protocol following bolus administration of intravenous contrast. CONTRAST:  117mL ISOVUE-300 IOPAMIDOL (ISOVUE-300) INJECTION 61%, 22mL ISOVUE-300 IOPAMIDOL (ISOVUE-300) INJECTION 61% COMPARISON:  10/23/2015, 10/03/2015, 08/06/2015 FINDINGS: Lower chest: Lung bases demonstrate no acute consolidation, effusion, or infiltrate. Hepatobiliary: Tiny sub cm hypodensity within the anterior aspect of  the liver slightly more apparent on today's exam. Stable right posterior inferior hepatic lobe 1.2 cm hypodense lesion, suspect hemangioma as there appears to be peripheral filling in on delayed images. Mild hypodensities centrally within the liver could relate to mild periportal edema. Mildly prominent extrahepatic common bile duct is unchanged. Surgical clips in the gallbladder fossa consistent with prior cholecystectomy. Pancreas: Unremarkable. No pancreatic ductal dilatation or surrounding inflammatory changes. Spleen: Normal in size without focal abnormality. Adrenals/Urinary Tract: Adrenal glands are unremarkable. Kidneys are normal, without renal calculi, focal lesion, or hydronephrosis. Bladder is unremarkable. Stomach/Bowel: The stomach is collapsed. There is no gastrostomy tube present within the stomach. Linear soft tissue tract present within the left anterior abdominal wall. Some soft tissue thickening in this region could relate to mild inflammation or possibly mild infection. There is no evidence for subcutaneous abscess. No evidence for a bowel obstruction. Postsurgical changes of the stomach and bowel consistent with prior gastric bypass surgery. Vascular/Lymphatic: Not aneurysmal aorta. No significantly enlarged abdominal or pelvic lymph nodes.  Reproductive: No adnexal masses. 4.1 cm masses in the right uterus, probable fibroid. Other: No free air or free fluid. Musculoskeletal: No acute osseous abnormality. Prominent SI joint sclerosis. IMPRESSION: 1. No gastrostomy tube present within the stomach. Linear gastrostomy tube tract is visualized within the left anterior abdominal wall. There is mild soft tissue stranding and thickening around the trach possibly related to inflammation. There is no evidence for a soft tissue abscess. 2. Status post gastric bypass surgery without evidence for a bowel obstruction. 3. 4.1 cm uterine masses, probable fibroid Electronically Signed   By: Donavan Foil M.D.   On: 01/30/2016 15:59   Ir Replc Gastro/colonic Tube Percut W/fluoro  Result Date: 01/31/2016 INDICATION: 45 year old female with prior gastric bypass and a surgically placed gastrostomy tube. The tube has fallen out. EXAM: GASTROSTOMY CATHETER REPLACEMENT MEDICATIONS: None ANESTHESIA/SEDATION: Fentanyl 50 mcg IV None CONTRAST:  20mL ISOVUE-300 IOPAMIDOL (ISOVUE-300) INJECTION 61% - administered into the gastric lumen. FLUOROSCOPY TIME:  Fluoroscopy Time: 2 minutes 42 seconds (81.3 mGy). COMPLICATIONS: None PROCEDURE: Informed written consent was obtained from the patient after a thorough discussion of the procedural risks, benefits and alternatives. All questions were addressed. Maximal Sterile Barrier Technique was utilized including caps, mask, sterile gowns, sterile gloves, sterile drape, hand hygiene and skin antiseptic. A timeout was performed prior to the initiation of the procedure. Viscous lidocaine jelly was used for local anesthesia. Once the patient was prepped and draped in the usual sterile fashion, contrast was infused through the indwelling Foley catheter to confirm location within the stomach lumen. Using modified Seldinger technique, the Foley catheter was exchanged over a wire for placement of a new 42 French balloon retention gastrostomy.  5 cc of saline was inflated within the balloon retention. Patient tolerated the procedure well and remained hemodynamically stable throughout. No complications were encountered and no significant blood loss. Note for future exchange, the distal catheter/ tube when easily beyond the surgical anastomosis/pylorus and needed to be retracted and repositioned into the stomach lumen. IMPRESSION: Status post rescue of gastrostomy tube, with placement of a new 31 French balloon retention gastrostomy tube. Signed, Dulcy Fanny. Earleen Newport, DO [facility] Electronically Signed   By: Corrie Mckusick D.O.   On: 01/31/2016 12:52   Dg Abdomen Peg Tube Location  Result Date: 01/30/2016 CLINICAL DATA:  PEG tube placement.  Initial encounter. EXAM: ABDOMEN - 1 VIEW COMPARISON:  CT of the abdomen and pelvis performed earlier today  at 3:35 p.m. FINDINGS: The patient's G-tube balloon is noted at the body of the stomach, with contrast filling the stomach. The visualized bowel gas pattern is unremarkable. Scattered air and stool filled loops of colon are seen; no abnormal dilatation of small bowel loops is seen to suggest small bowel obstruction. No free intra-abdominal air is identified, though evaluation for free air is limited on a single supine view. The visualized osseous structures are within normal limits; the sacroiliac joints are unremarkable in appearance. Contrast is noted filling the bladder. Clips are noted within the right upper quadrant, reflecting prior cholecystectomy. IMPRESSION: 1. G-tube balloon noted at the body of the stomach, with contrast filling the stomach. 2. Unremarkable bowel gas pattern; no free intra-abdominal air seen. Moderate amount of stool noted in the colon. Electronically Signed   By: Garald Balding M.D.   On: 01/30/2016 19:12    Procedures Procedures (including critical care time)  Medications Ordered in ED Medications  ondansetron (ZOFRAN)  injection 4 mg (4 mg Intravenous Given 01/30/16 1343)  morphine 4 MG/ML injection 4 mg (4 mg Intravenous Given 01/30/16 1344)  iopamidol (ISOVUE-300) 61 % injection 30 mL (30 mLs Oral Contrast Given 01/30/16 1401)  HYDROmorphone (DILAUDID) injection 0.5 mg (0.5 mg Intravenous Given 01/30/16 1436)  iopamidol (ISOVUE-300) 61 % injection 100 mL (100 mLs Intravenous Contrast Given 01/30/16 1528)  HYDROmorphone (DILAUDID) injection 1 mg (1 mg Intravenous Given 01/30/16 1702)  lidocaine (XYLOCAINE) 2 % jelly 1 application (1 application Topical Given by Other 01/30/16 1702)  ondansetron (ZOFRAN) injection 4 mg (4 mg Intravenous Given 01/30/16 1813)  ALPRAZolam (XANAX) tablet 0.5 mg (0.5 mg Oral Given 01/30/16 1813)  iopamidol (ISOVUE-300) 61 % injection (30 mLs Gastric Tube Contrast Given 01/30/16 1840)     Initial Impression / Assessment and Plan / ED Course  I have reviewed the triage vital signs and the nursing notes.  Pertinent labs & imaging results that were available during my care of the patient were reviewed by me and considered in my medical decision making (see chart for details).  Clinical Course   Patient presents to the ED with PEG tube issues. My exam PEG tube was in place with no signs of infection or purulent discharge. Patient with slight tenderness around the PEG tube site. The basic labs were performed that were unremarkable. No leukocytosis was noted. UA was unremarkable. Patient was taken to CAT scan to have CT of abdomen performed. On her return she lifted her shirt up and the PEG tube was completely out. CT of abdomen and pelvis no gastrostomy tube present within the stomach. Linear gastrostomy tube tract is visualized within the left anterior abdominal wall. There is mild soft tissue stranding and thickening around the trach possibly related to inflammation. There is no evidence for a soft tissue abscess. 2. Status post gastric bypass surgery without evidence for a bowel  obstruction. 3. 4.1 cm uterine masses, probable fibroid. Dr. Ellender Hose tried to insert a 16 French that was unable to be passed through the tract. A 14 French was then tried that was noted to be passed through the tract. Then a 68 [place] Foley was placed without difficulties to hold the tract open. I talked with IR who states that patient needs to come in tomorrow for an IR procedure to have PEG tube replaced. Patient has had to have an IR procedure in the past have PEG tube replaced. Patient's pain was treated in the ED. No signs of infection or abscess noted on  the CAT scan. Patient is hemodynamically stable this time. She is in no acute distress. She will be discharged home with strict return precautions and follow up with interventional radiology tomorrow. Pt is hemodynamically stable, in NAD, & able to ambulate in the ED. Pain has been managed & has no complaints prior to dc. Pt is comfortable with above plan and is stable for discharge at this time. All questions were answered prior to disposition. Strict return precautions for f/u to the ED were discussed.    Final Clinical Impressions(s) / ED Diagnoses   Final diagnoses:  Pain around PEG tube site, initial encounter  PEG tube malfunction [facility])    New Prescriptions Discharge Medication List as of 01/30/2016  7:27 PM       Doristine Devoid, PA-C 01/31/16 1442    Duffy Bruce, MD 01/31/16 1901

## 2016-01-30 NOTE — ED Notes (Addendum)
RN to start IV and obtain labs

## 2016-01-31 ENCOUNTER — Encounter (HOSPITAL_COMMUNITY): Payer: Self-pay | Admitting: Interventional Radiology

## 2016-01-31 ENCOUNTER — Ambulatory Visit (HOSPITAL_COMMUNITY)
Admission: RE | Admit: 2016-01-31 | Discharge: 2016-01-31 | Disposition: A | Discharge status: Home / Self Care | Payer: 59 | Source: Ambulatory Visit | Attending: Interventional Radiology | Admitting: Interventional Radiology

## 2016-01-31 ENCOUNTER — Other Ambulatory Visit (HOSPITAL_COMMUNITY): Payer: Self-pay | Admitting: Interventional Radiology

## 2016-01-31 DIAGNOSIS — Y733 Surgical instruments, materials and gastroenterology and urology devices (including sutures) associated with adverse incidents: Secondary | ICD-10-CM | POA: Insufficient documentation

## 2016-01-31 DIAGNOSIS — K9423 Gastrostomy malfunction: Secondary | ICD-10-CM | POA: Diagnosis not present

## 2016-01-31 HISTORY — PX: IR GENERIC HISTORICAL: IMG1180011

## 2016-01-31 MED ORDER — FENTANYL CITRATE (PF) 100 MCG/2ML IJ SOLN
50.0000 ug | Freq: Once | INTRAMUSCULAR | Status: AC
Start: 2016-01-31 — End: 2016-01-31
  Administered 2016-01-31: 50 ug via INTRAVENOUS

## 2016-01-31 MED ORDER — KETOROLAC TROMETHAMINE 30 MG/ML IJ SOLN
30.0000 mg | Freq: Once | INTRAMUSCULAR | Status: AC
  Administered 2016-01-31: 30 mg via INTRAVENOUS

## 2016-01-31 MED ORDER — FENTANYL CITRATE (PF) 100 MCG/2ML IJ SOLN
INTRAMUSCULAR | Status: AC
  Filled 2016-01-31: qty 2

## 2016-01-31 MED ORDER — LIDOCAINE VISCOUS 2 % MT SOLN
OROMUCOSAL | Status: AC
  Filled 2016-01-31: qty 15

## 2016-01-31 MED ORDER — KETOROLAC TROMETHAMINE 30 MG/ML IJ SOLN
INTRAMUSCULAR | Status: AC
  Administered 2016-01-31: 30 mg via INTRAVENOUS
  Filled 2016-01-31: qty 1

## 2016-01-31 MED ORDER — IOPAMIDOL (ISOVUE-300) INJECTION 61%
INTRAVENOUS | Status: AC
  Administered 2016-01-31: 15 mL via GASTROSTOMY
  Filled 2016-01-31: qty 50

## 2016-01-31 MED ORDER — LIDOCAINE HCL 1 % IJ SOLN
INTRAMUSCULAR | Status: AC
  Filled 2016-01-31: qty 20

## 2016-01-31 MED ORDER — LIDOCAINE VISCOUS 2 % MT SOLN
OROMUCOSAL | Status: DC | PRN
  Administered 2016-01-31: 15 mL via OROMUCOSAL

## 2016-01-31 MED FILL — oxyCODONE HCL 5 MG TABS: 5 | 10 days supply | Qty: 40 | Fill #0

## 2016-01-31 NOTE — Procedures (Signed)
Interventional Radiology Procedure Note  Procedure:  Replacement of gastrostomy.  60F balloon retention placed.  5cc of saline into the bulb, because of presumably decreased stomach lumen volume SP surgery.; Complications: None Recommendations:  - Ok to use - Do not submerge   - Routine care   Signed,  Dulcy Fanny. Earleen Newport, DO

## 2016-02-07 ENCOUNTER — Encounter: Payer: Self-pay | Admitting: Dietician

## 2016-02-07 ENCOUNTER — Encounter: Payer: 59 | Attending: General Surgery | Admitting: Dietician

## 2016-02-07 DIAGNOSIS — Z713 Dietary counseling and surveillance: Secondary | ICD-10-CM | POA: Insufficient documentation

## 2016-02-07 DIAGNOSIS — E43 Unspecified severe protein-calorie malnutrition: Secondary | ICD-10-CM | POA: Diagnosis not present

## 2016-02-07 DIAGNOSIS — E441 Mild protein-calorie malnutrition: Secondary | ICD-10-CM | POA: Diagnosis not present

## 2016-02-07 NOTE — Progress Notes (Signed)
Follow-up visit: 7 months Post-Operative RYGB Surgery  Medical Nutrition Therapy:  Appt start time: [id number] end time:  950  Primary concerns today: Post-operative Bariatric Surgery Nutrition Management.  Margaret Fletcher returns for a bariatric nutrition follow up 7 months post op RYGB. Anxious to increase exercise and feels like this would help with stress management. She states her PEG tube fell out on thanksgiving day (last week) and received a new tube in the emergency department. She reports that this was a painful experience and she has had soreness for the past week. Plans to see Dr. Redmond Pulling (surgeon) again in January. She continues to try to eat more by mouth rather than rely on the tube. Uses 8-ounce bottles to keep track of her fluid intake (drinks 2-3 per day by mouth). Feeling very thirsty sometimes. Has not been putting any water or formula in her PEG tube. Taking in 8-12 oz 2% milk by mouth, usually before bed. Yulanda reports that she does not like to use enteral formula because she is then unable to eat by mouth. Continues to struggle many days: "I wake up hungry but I can't tolerate food." Having a hard time using the muscles to have a bowel movement and requires Miralax and/or lactulose. Patient reports soft stools. Has tried to induce dumping syndrome with sweets or apple juice without success.   Surgery date: 07/08/2015 Surgery type: RYGB Start weight at [facility]: 278 lbs on 05/24/2014 Weight today: 200.2 lbs Weight change: 19.4 lbs Total weight loss: 78 lbs Goal weight: 197 lbs  TANITA  BODY COMP RESULTS  07/01/15 07/23/15 10/31/15 02/07/16   BMI (kg/m^2) 40.4 38.6 33.4 30.4   Fat Mass (lbs) 138.5 131.0 103.2 84   Fat Free Mass (lbs) 127.5 122.8 116.4 116.2   Total Body Water (lbs) 93.5 90.4 84.4 83.4    Preferred Learning Style:   No preference indicated   Learning Readiness:   Ready  Dietary intake:  May eat a burger patty but takes most of the day to eat it.  Vanilla Premier  protein shake blended with 1 cup berries and kale and was able to drink 90% of it throughout the day.   Yesterday:  One boiled, chopped chicken leg mixed with 1 Tbsp rice and spoonful of noodles. "Went down hard at first" and had to finish 2 hours later.   No longer supplementing with formula. Patient reports that she has cans of enteral formula at home but does not like to use it.   Fluid intake: 11 oz, 34 oz water, sweet tea cut with water  Estimated total protein intake: unable to determine  Medications: see list Supplementation: liquid multivitamin [name]), liquid Calcium + vitamin D, B12  Drinking while eating: yes Hair loss: unknown, patient has alopecia Carbonated beverages: tried a Coke for nausea but did not like it N/V/D/C: nausea and constipation Dumping syndrome: none  Recent physical activity:  Did not assess  Progress Towards Goal(s):  In progress.  Handouts given during visit include:  none   Nutritional Diagnosis:  NI-2.1 Inadequate intake As related to food intolerance, nausea, and taste aversions.  As evidenced by patient requiring enteral nutrition support to meet nutrient needs.    Intervention:  Nutrition counseling provided. Praised patient on efforts to continue to increase po intake and meet nutrition needs. Discussed overnight enteral feeds via PEG tube to meet fluid and macronutrient needs.  Teaching Method Utilized:  Visual Auditory Hands on  Barriers to learning/adherence to lifestyle change: nausea and inability to meet  protein needs orally  Demonstrated degree of understanding via:  Teach Back   Monitoring/Evaluation:  Dietary intake, exercise, and body weight. Follow up in 6 weeks.

## 2016-02-07 NOTE — Patient Instructions (Addendum)
-  Add moisture to meats (mayonnaise, mustard, yogurt ranch)  -Chew thoroughly, eat slowly, and take tiny bites -Experiment with some different temperatures -Control your environment (create a pleasant, simple atmosphere for trying new foods) -Continue to try to experiment with foods -Increase intake as tolerated -Work on taking your vitamins every day -Try using 1 can to supplement during the night  Goal: -Able to meet nutrition needs orally  Surgery date: 07/08/2015 Surgery type: RYGB Start weight at [facility]: 278 lbs on 05/24/2014 Weight today: 200.2 lbs Weight change: 19.4 lbs Total weight loss: 78 lbs Goal weight: 197 lbs  TANITA  BODY COMP RESULTS  07/01/15 07/23/15 10/31/15 02/07/16   BMI (kg/m^2) 40.4 38.6 33.4 30.4   Fat Mass (lbs) 138.5 131.0 103.2 84   Fat Free Mass (lbs) 127.5 122.8 116.4 116.2   Total Body Water (lbs) 93.5 90.4 84.4 83.4

## 2016-02-10 DIAGNOSIS — E43 Unspecified severe protein-calorie malnutrition: Secondary | ICD-10-CM | POA: Diagnosis not present

## 2016-02-10 DIAGNOSIS — E441 Mild protein-calorie malnutrition: Secondary | ICD-10-CM | POA: Diagnosis not present

## 2016-02-11 DIAGNOSIS — R1031 Right lower quadrant pain: Secondary | ICD-10-CM | POA: Diagnosis not present

## 2016-02-11 DIAGNOSIS — N939 Abnormal uterine and vaginal bleeding, unspecified: Secondary | ICD-10-CM | POA: Diagnosis not present

## 2016-02-11 DIAGNOSIS — D259 Leiomyoma of uterus, unspecified: Secondary | ICD-10-CM | POA: Diagnosis not present

## 2016-02-11 MED FILL — XULANE PATCH: 150-35 | 28 days supply | Qty: 3 | Fill #0

## 2016-02-19 ENCOUNTER — Other Ambulatory Visit: Payer: Self-pay | Admitting: General Surgery

## 2016-02-19 ENCOUNTER — Encounter (HOSPITAL_COMMUNITY): Payer: Self-pay | Admitting: *Deleted

## 2016-02-19 ENCOUNTER — Inpatient Hospital Stay (HOSPITAL_COMMUNITY)
Admission: AD | Admit: 2016-02-19 | Discharge: 2016-02-24 | DRG: 641 | Disposition: A | Discharge status: Home / Self Care | Payer: 59 | Source: Ambulatory Visit | Attending: General Surgery | Admitting: General Surgery

## 2016-02-19 ENCOUNTER — Observation Stay (HOSPITAL_COMMUNITY): Payer: 59

## 2016-02-19 DIAGNOSIS — K5900 Constipation, unspecified: Secondary | ICD-10-CM | POA: Diagnosis present

## 2016-02-19 DIAGNOSIS — E876 Hypokalemia: Secondary | ICD-10-CM

## 2016-02-19 DIAGNOSIS — G932 Benign intracranial hypertension: Secondary | ICD-10-CM | POA: Diagnosis not present

## 2016-02-19 DIAGNOSIS — Z79899 Other long term (current) drug therapy: Secondary | ICD-10-CM

## 2016-02-19 DIAGNOSIS — R1013 Epigastric pain: Secondary | ICD-10-CM

## 2016-02-19 DIAGNOSIS — Z9884 Bariatric surgery status: Secondary | ICD-10-CM

## 2016-02-19 DIAGNOSIS — E663 Overweight: Secondary | ICD-10-CM | POA: Diagnosis not present

## 2016-02-19 DIAGNOSIS — R7303 Prediabetes: Secondary | ICD-10-CM

## 2016-02-19 DIAGNOSIS — Z6829 Body mass index (BMI) 29.0-29.9, adult: Secondary | ICD-10-CM

## 2016-02-19 DIAGNOSIS — E440 Moderate protein-calorie malnutrition: Principal | ICD-10-CM | POA: Diagnosis present

## 2016-02-19 DIAGNOSIS — Z9989 Dependence on other enabling machines and devices: Secondary | ICD-10-CM

## 2016-02-19 DIAGNOSIS — F419 Anxiety disorder, unspecified: Secondary | ICD-10-CM | POA: Diagnosis present

## 2016-02-19 DIAGNOSIS — F329 Major depressive disorder, single episode, unspecified: Secondary | ICD-10-CM | POA: Diagnosis present

## 2016-02-19 DIAGNOSIS — Z9049 Acquired absence of other specified parts of digestive tract: Secondary | ICD-10-CM

## 2016-02-19 DIAGNOSIS — K5909 Other constipation: Secondary | ICD-10-CM

## 2016-02-19 DIAGNOSIS — G4733 Obstructive sleep apnea (adult) (pediatric): Secondary | ICD-10-CM | POA: Diagnosis present

## 2016-02-19 DIAGNOSIS — K912 Postsurgical malabsorption, not elsewhere classified: Secondary | ICD-10-CM | POA: Diagnosis not present

## 2016-02-19 DIAGNOSIS — Z882 Allergy status to sulfonamides status: Secondary | ICD-10-CM

## 2016-02-19 DIAGNOSIS — R110 Nausea: Secondary | ICD-10-CM

## 2016-02-19 DIAGNOSIS — Z4682 Encounter for fitting and adjustment of non-vascular catheter: Secondary | ICD-10-CM | POA: Diagnosis not present

## 2016-02-19 DIAGNOSIS — Z79891 Long term (current) use of opiate analgesic: Secondary | ICD-10-CM

## 2016-02-19 DIAGNOSIS — Z880 Allergy status to penicillin: Secondary | ICD-10-CM

## 2016-02-19 DIAGNOSIS — K219 Gastro-esophageal reflux disease without esophagitis: Secondary | ICD-10-CM | POA: Diagnosis present

## 2016-02-19 DIAGNOSIS — G8929 Other chronic pain: Secondary | ICD-10-CM | POA: Diagnosis present

## 2016-02-19 LAB — COMPREHENSIVE METABOLIC PANEL
ALBUMIN: 3.6 g/dL (ref 3.5–5.0)
ALK PHOS: 60 U/L (ref 38–126)
ALT: 12 U/L — ABNORMAL LOW (ref 14–54)
AST: 15 U/L (ref 15–41)
Anion gap: 7 (ref 5–15)
BILIRUBIN TOTAL: 0.7 mg/dL (ref 0.3–1.2)
BUN: 16 mg/dL (ref 6–20)
CALCIUM: 8.6 mg/dL — AB (ref 8.9–10.3)
CO2: 25 mmol/L (ref 22–32)
Chloride: 107 mmol/L (ref 101–111)
Creatinine, Ser: 1.14 mg/dL — ABNORMAL HIGH (ref 0.44–1.00)
GFR calc Af Amer: >60 mL/min (ref >60)
GFR, EST NON AFRICAN AMERICAN: 57 mL/min — AB (ref >60)
GLUCOSE: 85 mg/dL (ref 65–99)
Potassium: 3.4 mmol/L — ABNORMAL LOW (ref 3.5–5.1)
Sodium: 139 mmol/L (ref 135–145)
Total Protein: 6.9 g/dL (ref 6.5–8.1)

## 2016-02-19 LAB — PHOSPHORUS: Phosphorus: 3.1 mg/dL (ref 2.5–4.6)

## 2016-02-19 LAB — CBC WITH DIFFERENTIAL/PLATELET
BASOS PCT: 1 %
Basophils Absolute: 0.1 10*3/uL (ref 0.0–0.1)
Eosinophils Absolute: 0.2 10*3/uL (ref 0.0–0.7)
Eosinophils Relative: 2 %
HEMATOCRIT: 36.7 % (ref 36.0–46.0)
HEMOGLOBIN: 12.1 g/dL (ref 12.0–15.0)
LYMPHS ABS: 1.8 10*3/uL (ref 0.7–4.0)
LYMPHS PCT: 17 %
MCH: 30.6 pg (ref 26.0–34.0)
MCHC: 33 g/dL (ref 30.0–36.0)
MCV: 92.7 fL (ref 78.0–100.0)
MONO ABS: 0.7 10*3/uL (ref 0.1–1.0)
MONOS PCT: 7 %
NEUTROS PCT: 73 %
Neutro Abs: 7.9 10*3/uL — ABNORMAL HIGH (ref 1.7–7.7)
Platelets: 356 10*3/uL (ref 150–400)
RBC: 3.96 MIL/uL (ref 3.87–5.11)
RDW: 13.9 % (ref 11.5–15.5)
WBC: 10.6 10*3/uL — ABNORMAL HIGH (ref 4.0–10.5)

## 2016-02-19 LAB — VITAMIN B12: VITAMIN B 12: 302 pg/mL (ref 180–914)

## 2016-02-19 LAB — MAGNESIUM: MAGNESIUM: 2.1 mg/dL (ref 1.7–2.4)

## 2016-02-19 MED ORDER — IOPAMIDOL (ISOVUE-300) INJECTION 61%
INTRAVENOUS | Status: AC
  Filled 2016-02-19: qty 50

## 2016-02-19 MED ORDER — METHOCARBAMOL 500 MG PO TABS
500.0000 mg | ORAL_TABLET | Freq: Four times a day (QID) | ORAL | Status: DC | PRN
  Administered 2016-02-19: 500 mg via ORAL
  Filled 2016-02-19: qty 1

## 2016-02-19 MED ORDER — ONDANSETRON 4 MG PO TBDP
4.0000 mg | ORAL_TABLET | Freq: Four times a day (QID) | ORAL | Status: DC | PRN
  Administered 2016-02-23: 4 mg via ORAL
  Filled 2016-02-19: qty 1

## 2016-02-19 MED ORDER — OXYCODONE HCL 5 MG/5ML PO SOLN
2.5000 mg | ORAL | Status: DC | PRN
  Administered 2016-02-19 – 2016-02-21 (×6): 2.5 mg via ORAL
  Filled 2016-02-19 (×6): qty 5

## 2016-02-19 MED ORDER — ONDANSETRON HCL 4 MG/2ML IJ SOLN
4.0000 mg | Freq: Four times a day (QID) | INTRAMUSCULAR | Status: DC | PRN
  Administered 2016-02-21 – 2016-02-22 (×2): 4 mg via INTRAVENOUS
  Filled 2016-02-19 (×2): qty 2

## 2016-02-19 MED ORDER — ENOXAPARIN SODIUM 40 MG/0.4ML ~~LOC~~ SOLN
40.0000 mg | SUBCUTANEOUS | Status: DC
  Administered 2016-02-19 – 2016-02-20 (×2): 40 mg via SUBCUTANEOUS
  Filled 2016-02-19 (×2): qty 0.4

## 2016-02-19 MED ORDER — DIPHENHYDRAMINE HCL 12.5 MG/5ML PO ELIX: 12.5000 mg | ORAL_SOLUTION | Freq: Four times a day (QID) | ORAL | Status: DC | PRN

## 2016-02-19 MED ORDER — LACTATED RINGERS IV SOLN
INTRAVENOUS | Status: DC
  Administered 2016-02-20: 100 mL/h via INTRAVENOUS
  Administered 2016-02-20: 1000 mL via INTRAVENOUS
  Administered 2016-02-20: 01:00:00 via INTRAVENOUS

## 2016-02-19 MED ORDER — ACETAMINOPHEN 160 MG/5ML PO SOLN: 650.0000 mg | Freq: Four times a day (QID) | ORAL | Status: DC | PRN

## 2016-02-19 MED ORDER — PANTOPRAZOLE SODIUM 40 MG IV SOLR
40.0000 mg | Freq: Every day | INTRAVENOUS | Status: DC
  Administered 2016-02-19 – 2016-02-20 (×2): 40 mg via INTRAVENOUS
  Filled 2016-02-19 (×2): qty 40

## 2016-02-19 MED ORDER — THIAMINE HCL 100 MG/ML IJ SOLN
Freq: Once | INTRAVENOUS | Status: AC
  Administered 2016-02-19: 17:00:00 via INTRAVENOUS
  Filled 2016-02-19: qty 1000

## 2016-02-19 MED ORDER — IOPAMIDOL (ISOVUE-300) INJECTION 61%
50.0000 mL | Freq: Once | INTRAVENOUS | Status: AC | PRN
  Administered 2016-02-19: 50 mL via ORAL

## 2016-02-19 MED ORDER — DIPHENHYDRAMINE HCL 50 MG/ML IJ SOLN: 12.5000 mg | Freq: Four times a day (QID) | INTRAMUSCULAR | Status: DC | PRN

## 2016-02-19 MED ORDER — HYDROMORPHONE HCL 2 MG/ML IJ SOLN
1.0000 mg | INTRAMUSCULAR | Status: DC | PRN
  Administered 2016-02-21 – 2016-02-22 (×2): 1 mg via INTRAVENOUS
  Filled 2016-02-19 (×2): qty 1

## 2016-02-19 NOTE — H&P (Signed)
Margaret Fletcher 02/19/2016 9:08 AM Location: [facility] Patient #: [id number] DOB: 1970-09-11 Divorced / Language: Margaret Fletcher / Race: Black or African American Female  History of Present Illness [name] M. Markon Jares MD; 02/19/2016 12:57 PM) The patient is a 45 year old female presenting status-post bariatric surgery. 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 Her G-tube fell out a few days after surgery and had to be placed by interventional radiology. Her last office visit was in Nov. Her weight at that time was 204 pounds. Her preoperative visit weight was 273 pounds. Her initial visit weight is 286 pounds. After that office visit her G-tube became dislodged and she ended up having it repositioned in interventional radiology. Also she was started back on trophic G-tube feeds at night to help meet nutritional goals since her last visit. She comes in today as a walk-in. She is emotional and crying. She states that her stomach has been achy for the past 2 days. She woke up at 4 AM this morning which is worsening stomach "ache". She has had some nausea. She states that her upper abdomen is tender. As a result is now tender around the G-tube. She does not believe the G-tube has become mispositioned again. She tried Carafate without relief. She has been taking her Protonix daily. She denies any vomiting, fever or chills. She denies any melena or hematochezia. She states that prior to having issue starting 2 days ago she was doing half a can of tube feeds at night and drinking some throughout the day. She would drink some thick liquid soup. Otherwise she was not taking in much by mouth. She also reports that while she  is having bowel movements she is having to strain in order to evacuate. Her bowel movements are not hard she is just having to strain a lot.  Review of systems-a comprehensive 12 point review systems was performed and all systems are negative except for what is mentioned in HPI     Problem List/Past Medical 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, MD; 02/19/2016 12:59 PM) Oral Surgery Appendectomy Gallbladder Surgery - Laparoscopic Cesarean Section - Multiple  Diagnostic Studies History [name], MD; 02/19/2016 12:59 PM) Pap Smear 1-5 years ago Colonoscopy never Mammogram within last year  Allergies Patsey Berthold, [place]; 02/19/2016 9:08 AM) Sulfa Antibiotics  Medication History Patsey Berthold, [name]; 02/19/2016 9:09 AM) Protonix (40MG  Tablet DR, 1 (one) Oral daily, Taken starting 06/26/2015) Active. Vitamin D (Ergocalciferol) (50000UNIT Capsule, 1 (one) Capsule Oral once a week, Taken starting 07/03/2015) Active. Cyclobenzaprine HCl (10MG  Tablet, 1 (one) Tablet Oral every eight hours, as needed, Taken starting 10/25/2015) Active. Lactulose (10GM/15ML Solution, 15-30 Oral daily, as needed for constipation, Taken starting 12/30/2015) Active. Biotin Active. Wellbutrin (75MG  Tablet, Oral) Active. Calcium + D (150-261-330MG -MG-IU Capsule, Oral) Active. Vitamin B12 (100MCG Tablet, Oral) Active. Magnesium  Carbonate (250MG  Capsule, Oral) Active. Multiple  Vitamin (Oral) Active. Vitamin B6 (200MG  Tablet, Oral) Active. Imitrex (25MG  Tablet, Oral) Active. Topiramate (100MG  Tablet, Oral) Active. Carafate (1GM/10ML Suspension, Oral) Active. Medications Reconciled  Social History [name], MD; 02/19/2016 12:59 PM) Tobacco use Never smoker. No drug use Caffeine use Carbonated beverages, Coffee, Tea. Alcohol use Occasional alcohol use.  Family History [name], MD; 02/19/2016 12:59 PM) Colon Polyps Father. Heart disease in female family member before age 49 Breast Cancer Mother. Cerebrovascular Accident Father. Seizure disorder Son. Heart disease in female family member before age 41 Diabetes Mellitus Brother.  Pregnancy / Birth History [name], MD; 02/19/2016 12:59 PM) Para 2 Maternal age 57-25 Age at menarche 71 years. Gravida 3 Irregular periods  Other Problems [name], MD; 02/19/2016 12:59 PM) Hemorrhoids Migraine Headache Anxiety Disorder MORBID OBESITY (E66.01)    Vitals Patsey Berthold [name]; 02/19/2016 9:09 AM) 02/19/2016 9:09 AM Weight: 194 lb Height: 67.5in Body Surface Area: 2.01 m Body Mass Index: 29.94 kg/m  Temp.: 98.46F  Pulse: 68 (Regular)  BP: 130/90 (Sitting, Left Arm, Standard)      Physical Exam [name] M. Iridessa Harrow MD; 02/19/2016 12:53 PM)  General Mental Status-Alert. General Appearance-Consistent with stated age. Hydration-Well hydrated. Voice-Normal. Note: emotional at times  Head and Neck Head-normocephalic, atraumatic with no lesions or palpable masses. Trachea-midline. Thyroid Gland Characteristics - normal size and consistency.  Eye Eyeball - Bilateral-Normal. Sclera/Conjunctiva - Bilateral-No scleral icterus.  Chest and Lung Exam Chest and lung exam reveals -quiet, even and easy respiratory effort with no use of accessory muscles and on auscultation,  normal breath sounds, no adventitious sounds and normal vocal resonance. Inspection Chest Wall - Normal. Back - normal.  Breast - Did not examine.  Cardiovascular Cardiovascular examination reveals -normal heart sounds, regular rate and rhythm with no murmurs and normal pedal pulses bilaterally.  Abdomen Inspection Inspection of the abdomen reveals - No Hernias. Skin - Scar - Note: well healed trocar scars; G tube LUQ - intact and secure. Palpation/Percussion Palpation and Percussion of the abdomen reveal - Soft, No Rebound tenderness, No Rigidity (guarding) and No hepatosplenomegaly. Note: epigastric TTP. Auscultation Auscultation of the abdomen reveals - Bowel sounds normal.  Peripheral Vascular Upper Extremity Palpation - Pulses bilaterally normal.  Neurologic Neurologic evaluation reveals -alert and oriented x 3 with no impairment of recent or remote memory. Mental Status-Normal.  Neuropsychiatric The patient's mood and affect are described as -normal. Judgment and Insight-insight is appropriate concerning matters relevant to self.  Musculoskeletal Normal Exam - Left-Upper Extremity Strength Normal and Lower Extremity Strength Normal. Normal Exam - Right-Upper Extremity Strength Normal and Lower Extremity Strength Normal.  Lymphatic Head & Neck  General Head & Neck Lymphatics: Bilateral - Description - Normal. Axillary - Did not examine. Femoral & Inguinal - Did not examine.    Assessment & Plan [name] M. Indy Prestwood MD; 02/19/2016 12:59 PM)  GASTRIC BYPASS STATUS FOR OBESITY (Z98.84) Impression: It still unclear what is causing her epigastric tenderness. She has complained of the achy sensation chronically. At this point I have recommended admission to the hospital since she is not taking in much by mouth and is only taking in about half a can of tube feeds at night. I believe she needs to be admitted for labs and IV fluids. We will first start with an upper  GI. Advised her we may need to repeat an upper endoscopy. We will also check vitamin levels to make sure there is not a vitamin deficiency. 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 Redmond Pulling, MD, FACS General, Bariatric, & Minimally Invasive Surgery [facility], [place]

## 2016-02-20 ENCOUNTER — Observation Stay (HOSPITAL_COMMUNITY): Payer: 59

## 2016-02-20 ENCOUNTER — Encounter (HOSPITAL_COMMUNITY): Payer: Self-pay | Admitting: Radiology

## 2016-02-20 DIAGNOSIS — Z9884 Bariatric surgery status: Secondary | ICD-10-CM | POA: Diagnosis not present

## 2016-02-20 DIAGNOSIS — Z79899 Other long term (current) drug therapy: Secondary | ICD-10-CM | POA: Diagnosis not present

## 2016-02-20 DIAGNOSIS — K2900 Acute gastritis without bleeding: Secondary | ICD-10-CM | POA: Diagnosis not present

## 2016-02-20 DIAGNOSIS — E440 Moderate protein-calorie malnutrition: Secondary | ICD-10-CM | POA: Diagnosis not present

## 2016-02-20 DIAGNOSIS — F329 Major depressive disorder, single episode, unspecified: Secondary | ICD-10-CM | POA: Diagnosis present

## 2016-02-20 DIAGNOSIS — Z9989 Dependence on other enabling machines and devices: Secondary | ICD-10-CM | POA: Diagnosis not present

## 2016-02-20 DIAGNOSIS — Z880 Allergy status to penicillin: Secondary | ICD-10-CM | POA: Diagnosis not present

## 2016-02-20 DIAGNOSIS — E876 Hypokalemia: Secondary | ICD-10-CM | POA: Diagnosis not present

## 2016-02-20 DIAGNOSIS — Z6829 Body mass index (BMI) 29.0-29.9, adult: Secondary | ICD-10-CM | POA: Diagnosis not present

## 2016-02-20 DIAGNOSIS — F419 Anxiety disorder, unspecified: Secondary | ICD-10-CM | POA: Diagnosis present

## 2016-02-20 DIAGNOSIS — Z79891 Long term (current) use of opiate analgesic: Secondary | ICD-10-CM | POA: Diagnosis not present

## 2016-02-20 DIAGNOSIS — K5900 Constipation, unspecified: Secondary | ICD-10-CM | POA: Diagnosis present

## 2016-02-20 DIAGNOSIS — G932 Benign intracranial hypertension: Secondary | ICD-10-CM | POA: Diagnosis present

## 2016-02-20 DIAGNOSIS — R1310 Dysphagia, unspecified: Secondary | ICD-10-CM | POA: Diagnosis not present

## 2016-02-20 DIAGNOSIS — R109 Unspecified abdominal pain: Secondary | ICD-10-CM | POA: Diagnosis present

## 2016-02-20 DIAGNOSIS — R1013 Epigastric pain: Secondary | ICD-10-CM | POA: Diagnosis not present

## 2016-02-20 DIAGNOSIS — R7303 Prediabetes: Secondary | ICD-10-CM | POA: Diagnosis not present

## 2016-02-20 DIAGNOSIS — R633 Feeding difficulties: Secondary | ICD-10-CM | POA: Diagnosis not present

## 2016-02-20 DIAGNOSIS — G4733 Obstructive sleep apnea (adult) (pediatric): Secondary | ICD-10-CM | POA: Diagnosis present

## 2016-02-20 DIAGNOSIS — K219 Gastro-esophageal reflux disease without esophagitis: Secondary | ICD-10-CM | POA: Diagnosis present

## 2016-02-20 DIAGNOSIS — Z9049 Acquired absence of other specified parts of digestive tract: Secondary | ICD-10-CM | POA: Diagnosis not present

## 2016-02-20 DIAGNOSIS — Z882 Allergy status to sulfonamides status: Secondary | ICD-10-CM | POA: Diagnosis not present

## 2016-02-20 DIAGNOSIS — E785 Hyperlipidemia, unspecified: Secondary | ICD-10-CM | POA: Diagnosis not present

## 2016-02-20 LAB — BASIC METABOLIC PANEL
Anion gap: 5 (ref 5–15)
BUN: 16 mg/dL (ref 6–20)
CALCIUM: 8.5 mg/dL — AB (ref 8.9–10.3)
CHLORIDE: 111 mmol/L (ref 101–111)
CO2: 25 mmol/L (ref 22–32)
CREATININE: 1.09 mg/dL — AB (ref 0.44–1.00)
GFR calc Af Amer: >60 mL/min (ref >60)
GFR calc non Af Amer: 60 mL/min — ABNORMAL LOW (ref >60)
Glucose, Bld: 82 mg/dL (ref 65–99)
Potassium: 3.8 mmol/L (ref 3.5–5.1)
Sodium: 141 mmol/L (ref 135–145)

## 2016-02-20 LAB — GLUCOSE, CAPILLARY
GLUCOSE-CAPILLARY: 99 mg/dL (ref 65–99)
Glucose-Capillary: 83 mg/dL (ref 65–99)
Glucose-Capillary: 95 mg/dL (ref 65–99)

## 2016-02-20 MED ORDER — IOPAMIDOL (ISOVUE-300) INJECTION 61%
15.0000 mL | Freq: Once | INTRAVENOUS | Status: DC | PRN
  Administered 2016-02-20: 15 mL via ORAL
  Filled 2016-02-20: qty 30

## 2016-02-20 MED ORDER — SODIUM CHLORIDE 0.9 % IJ SOLN
INTRAMUSCULAR | Status: AC
  Filled 2016-02-20: qty 50

## 2016-02-20 MED ORDER — ALPRAZOLAM 0.5 MG PO TABS
0.5000 mg | ORAL_TABLET | Freq: Two times a day (BID) | ORAL | Status: DC | PRN
  Administered 2016-02-21: 0.5 mg via ORAL
  Filled 2016-02-20: qty 1

## 2016-02-20 MED ORDER — VITAL 1.5 CAL PO LIQD
1000.0000 mL | ORAL | Status: DC
  Administered 2016-02-20: 1000 mL
  Filled 2016-02-20 (×2): qty 1000

## 2016-02-20 MED ORDER — BUPROPION HCL ER (XL) 300 MG PO TB24
300.0000 mg | ORAL_TABLET | Freq: Every day | ORAL | Status: DC
  Administered 2016-02-20 – 2016-02-24 (×5): 300 mg via ORAL
  Filled 2016-02-20 (×5): qty 1

## 2016-02-20 MED ORDER — SUCRALFATE 1 GM/10ML PO SUSP
1.0000 g | Freq: Three times a day (TID) | ORAL | Status: DC
  Administered 2016-02-20 – 2016-02-24 (×15): 1 g via ORAL
  Filled 2016-02-20 (×15): qty 10

## 2016-02-20 MED ORDER — IOPAMIDOL (ISOVUE-300) INJECTION 61%
100.0000 mL | Freq: Once | INTRAVENOUS | Status: AC | PRN
  Administered 2016-02-20: 100 mL via INTRAVENOUS

## 2016-02-20 MED ORDER — TOPIRAMATE 100 MG PO TABS
100.0000 mg | ORAL_TABLET | Freq: Every day | ORAL | Status: DC
  Administered 2016-02-20 – 2016-02-23 (×4): 100 mg via ORAL
  Filled 2016-02-20 (×4): qty 1

## 2016-02-20 MED ORDER — ADULT MULTIVITAMIN LIQUID CH
15.0000 mL | Freq: Every day | ORAL | Status: DC
  Administered 2016-02-20 – 2016-02-24 (×5): 15 mL via ORAL
  Filled 2016-02-20 (×5): qty 15

## 2016-02-20 MED ORDER — IOPAMIDOL (ISOVUE-300) INJECTION 61%
INTRAVENOUS | Status: AC
  Filled 2016-02-20: qty 100

## 2016-02-20 MED ORDER — VITAL HIGH PROTEIN PO LIQD: 1000.0000 mL | ORAL | Status: DC

## 2016-02-20 NOTE — Progress Notes (Signed)
Initial Nutrition Assessment  DOCUMENTATION CODES:   Severe malnutrition in context of chronic illness  INTERVENTION:   Monitor magnesium, potassium, and phosphorus daily for at least 3 days, MD to replete as needed, as pt is at risk for refeeding syndrome given severe malnutrition and poor PO.  Initiate Vital 1.5 @ 20 ml/hr via G-tube. Provide 720 kcal and 32g protein.  Provide Boost Breeze po TID with clear liquid trays, each supplement provides 250 kcal and 9 grams of protein  Recommend 100 mg Thiamine for at least 3 days.  Goal rate: Vital 1.5 @ 55 ml/hr.  Tube feeding regimen provides 1980 kcal (100% of needs), 89 grams of protein, and 1008 ml of H2O.   RD will continue to follow  NUTRITION DIAGNOSIS:   Malnutrition related to chronic illness as evidenced by energy intake < 75% for > or equal to 1 month, percent weight loss, mild depletion of body fat, mild depletion of muscle mass.  GOAL:   Patient will meet greater than or equal to 90% of their needs  MONITOR:   PO intake, Supplement acceptance, Labs, Weight trends, I & O's  REASON FOR ASSESSMENT:   Consult Enteral/tube feeding initiation and management  ASSESSMENT:    45 year old female presenting status-post bariatric surgery. 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  Her G-tube fell out a few days after surgery and had to be placed by interventional radiology.  Her last office visit was in Nov.  Her weight at that time was 204 pounds.  Her preoperative visit weight was 273 pounds.  Her initial visit weight is 286 pounds.  After that office visit her G-tube became dislodged and she ended up having it repositioned in interventional radiology.  Also  she was started back on trophic G-tube feeds at night to help meet nutritional goals since her last visit.  She comes in today as a walk-in.  She is emotional and crying.  She states that her stomach has been achy for the past 2 days.  She woke up at 4 AM this morning which is worsening stomach "ache".  She has had some nausea.  She states that her upper abdomen is tender.  As a result is now tender around the G-tube.  She does not believe the G-tube has become mispositioned again.  She tried Carafate without relief.  She has been taking her Protonix daily.  She denies any vomiting, fever or chills.  She denies any melena or hematochezia. She states that prior to having issue starting 2 days ago she was doing half a can of tube feeds at night and drinking some throughout the day.  She would drink some thick liquid soup.  Otherwise she was not taking in much by mouth. 11/24: s/p replacement of G-tube in gastric remnant  Pt reports chronic abdominal pain. She has been eating poorly over the last couple of days d/t this pain. Prior to these symptoms pt was infusing 1 can Vital HP overnight (237 kcal, 20g protein) and consuming mainly liquids throughout the day (like soups, water and [place] milk (1 cup = 120 kcal and 13g protein). Pt has been taking her vitamin supplements as recommended: [place] liquid multivitamin, liquid Calcium +Vitamin D, B12. MD has ordered for  vitamin labs, will monitor for results.   Patient reports trying different methods to try and tolerate PO intake. Pt states she has tried to consume sweet liquids and induce dumping syndrome to help with constipation. Pt states she has not been consistent in taking Carafate with intake or pain medications.  Patient does c/o fullness with intake.  Pt reports little intake yesterday: 1 cup of chicken broth and 2 cups of ice chips. Today, pt consumed all of water with Isovue (12 oz) and an icee (4oz). Total today: 16 oz.  Patient willing to try  Boost Breeze with clear liquid trays. Will order and monitor for tolerance. Will initiate Vital 1.5 @ 20 ml/hr and will assess for tolerance and ability to advance.  Pt has lost 26 lb since 9/22 (12% wt loss x 2.5 months, significant for time frame). Pt denies problems with vision and brittle nails. She does report some bleeding gums with brushing her teeth. Nutrition-Focused physical exam completed. Findings are mild fat depletion, mild muscle depletion, and no edema.  Medications: Liquid MVI daily, IV Protonix daily, Carafate suspension QID  Labs reviewed: Vitamin B-12 WNL  Diet Order:  Diet clear liquid Room service appropriate? Yes  Skin:  Reviewed, no issues  Last BM:  12/8  Height:   Ht Readings from Last 1 Encounters:  02/19/16 5\' 8"  (1.727 m)    Weight:   Wt Readings from Last 1 Encounters:  02/19/16 189 lb 14.4 oz (86.1 kg)    Ideal Body Weight:  63.6 kg  BMI:  Body mass index is 28.87 kg/m.  Estimated Nutritional Needs:   Kcal:  1800-2000  Protein:  75-85g  Fluid:  2L/day  EDUCATION NEEDS:   Education needs addressed  Clayton Bibles, MS, RD, LDN Pager: [phone] After Hours Pager: [phone]

## 2016-02-20 NOTE — Progress Notes (Signed)
Spoke with patient at bedside, patient confirms she has services with [facility] in the past and would like to use them again if needed. Awaiting further testing to decide plan of care.

## 2016-02-20 NOTE — Progress Notes (Signed)
Patient ID: Margaret Fletcher, female   DOB: [birth year], 45 y.o.   MRN: [id number]  Progress Note: Metabolic and Bariatric Surgery Service   Subjective: Epigastric pain/tenderness eased up yesterday evening but was encouraged to eat/drink last night, now it is back like it was yesterday morning. Took in 1 cup of shake and 2 cups of ice. Epigastric area is achy  Objective: Vital signs in last 24 hours: Temp:  [98.4 F (36.9 C)-99.5 F (37.5 C)] 98.7 F (37.1 C) (12/14 0535) Pulse Rate:  [60-72] 61 (12/14 0535) Resp:  [18] 18 (12/14 0535) BP: (103-148)/(58-112) 122/75 (12/14 0535) SpO2:  [94 %-100 %] 100 % (12/14 0535) Weight:  [86.1 kg (189 lb 14.4 oz)] 86.1 kg (189 lb 14.4 oz) (12/13 1500) Last BM Date: 02/14/16  Intake/Output from previous day: 12/13 0701 - 12/14 0700 In: 763.3 [I.V.:763.3] Out: 250 [Urine:250] Intake/Output this shift: No intake/output data recorded.  Lungs: cta  Cardiovascular: reg  Abd: soft, no peritonitis, epigastric ttp, no rt  Extremities: no edema  Neuro: nonfocal, not tearful  Lab Results: CBC   Recent Labs  02/19/16 1434  WBC 10.6*  HGB 12.1  HCT 36.7  PLT 356   BMET  Recent Labs  02/19/16 1434 02/20/16 0440  NA 139 141  K 3.4* 3.8  CL 107 111  CO2 25 25  GLUCOSE 85 82  BUN 16 16  CREATININE 1.14* 1.09*  CALCIUM 8.6* 8.5*   PT/INR No results for input(s): LABPROT, INR in the last 72 hours. ABG No results for input(s): PHART, HCO3 in the last 72 hours.  Invalid input(s): PCO2, PO2  Studies/Results:  Anti-infectives: Anti-infectives    None      Medications: Scheduled Meds: . buPROPion  300 mg Oral Daily  . enoxaparin (LOVENOX) injection  40 mg Subcutaneous Q24H  . multivitamin  15 mL Oral Daily  . pantoprazole (PROTONIX) IV  40 mg Intravenous QHS  . topiramate  100 mg Oral QHS   Continuous Infusions: . lactated ringers 100 mL/hr at 02/20/16 0600   PRN Meds:.acetaminophen (TYLENOL) oral liquid 160 mg/5 mL,  ALPRAZolam, diphenhydrAMINE **OR** diphenhydrAMINE, HYDROmorphone (DILAUDID) injection, methocarbamol, ondansetron **OR** ondansetron (ZOFRAN) IV, oxyCODONE  Assessment/Plan: Patient Active Problem List   Diagnosis Date Noted  . Epigastric pain 02/19/2016  . Protein-calorie malnutrition, severe 10/08/2015  . Mild protein-calorie malnutrition ([place]) 10/07/2015  . Bacteremia 09/12/2015  . Protein-calorie malnutrition, mild ([place]) 08/19/2015  . Prediabetes 07/08/2015  . Morbid obesity ([place]) 07/08/2015  . Mild obstructive sleep apnea 07/08/2015  . Dyslipidemia 07/08/2015  . S/P gastric bypass 07/08/2015  . IIH (idiopathic intracranial hypertension) 06/03/2015  . Chronic migraine without aura or status migrainosus 04/30/2015  . Worsening headaches 04/30/2015  . Vision changes 04/30/2015  . Perceived hearing changes 04/30/2015  . Liver lesion 03/21/2013  . Splenic cyst 03/21/2013  . Abdominal pain 03/21/2013   s/p   H/o LRYGB 07/2015 H/o revision of candy cane limb and G tube insertion Summer 2017 Ongoing issues with po tolerance now with worsening epigastric pain  ugi looked good. Has had cholecystectomy in past so not gallbladder. No filling of remaining candy cane limb. Patent G-J.  Ulcer?  No obvious ulcer on ugi.  Will check CT. If ct negative, will consider repeat upper endo Cont protonix Add carafate Start trophic tube feeds - she has never been able to tolerate full TF rate due to discomfort Await vitamin labs   Disposition:  LOS: 1 day  The patient does not meet criteria  for discharge because She has uncontrolled pain and is requiring intravenous medications  [name], MD [phone] [facility]

## 2016-02-21 ENCOUNTER — Encounter (HOSPITAL_COMMUNITY): Payer: Self-pay

## 2016-02-21 ENCOUNTER — Inpatient Hospital Stay (HOSPITAL_COMMUNITY): Payer: 59 | Admitting: Anesthesiology

## 2016-02-21 ENCOUNTER — Encounter (HOSPITAL_COMMUNITY)
Admission: AD | Disposition: A | Discharge status: Home / Self Care | Payer: Self-pay | Source: Ambulatory Visit | Attending: General Surgery

## 2016-02-21 HISTORY — PX: ESOPHAGOGASTRODUODENOSCOPY: SHX5428

## 2016-02-21 LAB — BASIC METABOLIC PANEL
Anion gap: 5 (ref 5–15)
BUN: 8 mg/dL (ref 6–20)
CHLORIDE: 107 mmol/L (ref 101–111)
CO2: 24 mmol/L (ref 22–32)
Calcium: 8.4 mg/dL — ABNORMAL LOW (ref 8.9–10.3)
Creatinine, Ser: 0.99 mg/dL (ref 0.44–1.00)
GFR calc Af Amer: >60 mL/min (ref >60)
GFR calc non Af Amer: >60 mL/min (ref >60)
GLUCOSE: 99 mg/dL (ref 65–99)
POTASSIUM: 3.4 mmol/L — AB (ref 3.5–5.1)
Sodium: 136 mmol/L (ref 135–145)

## 2016-02-21 LAB — GLUCOSE, CAPILLARY
GLUCOSE-CAPILLARY: 85 mg/dL (ref 65–99)
GLUCOSE-CAPILLARY: 100 mg/dL — AB (ref 65–99)
GLUCOSE-CAPILLARY: 78 mg/dL (ref 65–99)
GLUCOSE-CAPILLARY: 112 mg/dL — AB (ref 65–99)
GLUCOSE-CAPILLARY: 91 mg/dL (ref 65–99)

## 2016-02-21 LAB — ZINC: ZINC: 57 ug/dL (ref 56–134)

## 2016-02-21 LAB — PREALBUMIN: PREALBUMIN: 10.7 mg/dL — AB (ref 18–38)

## 2016-02-21 LAB — COPPER, SERUM: Copper: 113 ug/dL (ref 72–166)

## 2016-02-21 SURGERY — EGD (ESOPHAGOGASTRODUODENOSCOPY)
Anesthesia: Monitor Anesthesia Care

## 2016-02-21 MED ORDER — SODIUM CHLORIDE 0.9 % IV SOLN: INTRAVENOUS | Status: DC

## 2016-02-21 MED ORDER — LACTATED RINGERS IV SOLN
INTRAVENOUS | Status: DC | PRN
  Administered 2016-02-21 (×2): via INTRAVENOUS

## 2016-02-21 MED ORDER — DEXTROSE 50 % IV SOLN
25.0000 mL | Freq: Once | INTRAVENOUS | Status: AC
  Administered 2016-02-21: 25 mL via INTRAVENOUS

## 2016-02-21 MED ORDER — PROPOFOL 10 MG/ML IV BOLUS
INTRAVENOUS | Status: AC
  Filled 2016-02-21: qty 40

## 2016-02-21 MED ORDER — NORELGESTROMIN-ETH ESTRADIOL 150-35 MCG/24HR TD PTWK
1.0000 | MEDICATED_PATCH | TRANSDERMAL | Status: DC
  Administered 2016-02-20: 1 via TRANSDERMAL

## 2016-02-21 MED ORDER — SIMETHICONE 80 MG PO CHEW
80.0000 mg | CHEWABLE_TABLET | Freq: Four times a day (QID) | ORAL | Status: DC | PRN
  Administered 2016-02-21 – 2016-02-22 (×2): 80 mg via ORAL
  Filled 2016-02-21 (×2): qty 1

## 2016-02-21 MED ORDER — VITAL 1.5 CAL PO LIQD
1000.0000 mL | ORAL | Status: DC
  Administered 2016-02-22 – 2016-02-23 (×2): 1000 mL
  Filled 2016-02-21 (×4): qty 1000

## 2016-02-21 MED ORDER — SENNOSIDES-DOCUSATE SODIUM 8.6-50 MG PO TABS
1.0000 | ORAL_TABLET | Freq: Every day | ORAL | Status: DC
  Administered 2016-02-21: 1 via ORAL
  Filled 2016-02-21: qty 1

## 2016-02-21 MED ORDER — VITAMIN B-1 100 MG PO TABS
100.0000 mg | ORAL_TABLET | Freq: Every day | ORAL | Status: DC
  Administered 2016-02-22 – 2016-02-24 (×3): 100 mg via ORAL
  Filled 2016-02-21 (×3): qty 1

## 2016-02-21 MED ORDER — PROPOFOL 500 MG/50ML IV EMUL
INTRAVENOUS | Status: DC | PRN
  Administered 2016-02-21: 300 ug/kg/min via INTRAVENOUS

## 2016-02-21 MED ORDER — POTASSIUM CHLORIDE 20 MEQ/15ML (10%) PO SOLN
20.0000 meq | Freq: Once | ORAL | Status: AC
  Administered 2016-02-21: 20 meq
  Filled 2016-02-21: qty 15

## 2016-02-21 MED ORDER — CALCIUM CARBONATE-VITAMIN D 500-200 MG-UNIT PO TABS
1.0000 | ORAL_TABLET | Freq: Three times a day (TID) | ORAL | Status: DC
Start: 2016-02-21 — End: 2016-02-24
  Administered 2016-02-21 – 2016-02-24 (×6): 1 via ORAL
  Filled 2016-02-21 (×7): qty 1

## 2016-02-21 MED ORDER — ENOXAPARIN SODIUM 40 MG/0.4ML ~~LOC~~ SOLN
40.0000 mg | SUBCUTANEOUS | Status: DC
  Administered 2016-02-22 – 2016-02-23 (×2): 40 mg via SUBCUTANEOUS
  Filled 2016-02-21 (×2): qty 0.4

## 2016-02-21 MED ORDER — BOOST / RESOURCE BREEZE PO LIQD
1.0000 | Freq: Two times a day (BID) | ORAL | Status: DC
  Administered 2016-02-22: 1 via ORAL

## 2016-02-21 MED ORDER — PANTOPRAZOLE SODIUM 40 MG PO PACK
40.0000 mg | PACK | Freq: Every day | ORAL | Status: DC
  Administered 2016-02-22 – 2016-02-23 (×2): 40 mg via ORAL
  Filled 2016-02-21 (×4): qty 20

## 2016-02-21 MED ORDER — LACTATED RINGERS IV SOLN
INTRAVENOUS | Status: DC
  Administered 2016-02-21 – 2016-02-22 (×2): via INTRAVENOUS

## 2016-02-21 NOTE — H&P (View-Only) (Signed)
Patient ID: Margaret Fletcher, female   DOB: 12/15/1970, 45 y.o.   MRN: [id number]  Progress Note: Metabolic and Bariatric Surgery Service   Subjective: Epigastric pain/tenderness eased up yesterday evening but was encouraged to eat/drink last night, now it is back like it was yesterday morning. Took in 1 cup of shake and 2 cups of ice. Epigastric area is achy  Objective: Vital signs in last 24 hours: Temp:  [98.4 F (36.9 C)-99.5 F (37.5 C)] 98.7 F (37.1 C) (12/14 0535) Pulse Rate:  [60-72] 61 (12/14 0535) Resp:  [18] 18 (12/14 0535) BP: (103-148)/(58-112) 122/75 (12/14 0535) SpO2:  [94 %-100 %] 100 % (12/14 0535) Weight:  [86.1 kg (189 lb 14.4 oz)] 86.1 kg (189 lb 14.4 oz) (12/13 1500) Last BM Date: 02/14/16  Intake/Output from previous day: 12/13 0701 - 12/14 0700 In: 763.3 [I.V.:763.3] Out: 250 [Urine:250] Intake/Output this shift: No intake/output data recorded.  Lungs: cta  Cardiovascular: reg  Abd: soft, no peritonitis, epigastric ttp, no rt  Extremities: no edema  Neuro: nonfocal, not tearful  Lab Results: CBC   Recent Labs  02/19/16 1434  WBC 10.6*  HGB 12.1  HCT 36.7  PLT 356   BMET  Recent Labs  02/19/16 1434 02/20/16 0440  NA 139 141  K 3.4* 3.8  CL 107 111  CO2 25 25  GLUCOSE 85 82  BUN 16 16  CREATININE 1.14* 1.09*  CALCIUM 8.6* 8.5*   PT/INR No results for input(s): LABPROT, INR in the last 72 hours. ABG No results for input(s): PHART, HCO3 in the last 72 hours.  Invalid input(s): PCO2, PO2  Studies/Results:  Anti-infectives: Anti-infectives    None      Medications: Scheduled Meds: . buPROPion  300 mg Oral Daily  . enoxaparin (LOVENOX) injection  40 mg Subcutaneous Q24H  . multivitamin  15 mL Oral Daily  . pantoprazole (PROTONIX) IV  40 mg Intravenous QHS  . topiramate  100 mg Oral QHS   Continuous Infusions: . lactated ringers 100 mL/hr at 02/20/16 0600   PRN Meds:.acetaminophen (TYLENOL) oral liquid 160 mg/5 mL,  ALPRAZolam, diphenhydrAMINE **OR** diphenhydrAMINE, HYDROmorphone (DILAUDID) injection, methocarbamol, ondansetron **OR** ondansetron (ZOFRAN) IV, oxyCODONE  Assessment/Plan: Patient Active Problem List   Diagnosis Date Noted  . Epigastric pain 02/19/2016  . Protein-calorie malnutrition, severe 10/08/2015  . Mild protein-calorie malnutrition ([place]) 10/07/2015  . Bacteremia 09/12/2015  . Protein-calorie malnutrition, mild ([place]) 08/19/2015  . Prediabetes 07/08/2015  . Morbid obesity ([place]) 07/08/2015  . Mild obstructive sleep apnea 07/08/2015  . Dyslipidemia 07/08/2015  . S/P gastric bypass 07/08/2015  . IIH (idiopathic intracranial hypertension) 06/03/2015  . Chronic migraine without aura or status migrainosus 04/30/2015  . Worsening headaches 04/30/2015  . Vision changes 04/30/2015  . Perceived hearing changes 04/30/2015  . Liver lesion 03/21/2013  . Splenic cyst 03/21/2013  . Abdominal pain 03/21/2013   s/p   H/o LRYGB 07/2015 H/o revision of candy cane limb and G tube insertion Summer 2017 Ongoing issues with po tolerance now with worsening epigastric pain  ugi looked good. Has had cholecystectomy in past so not gallbladder. No filling of remaining candy cane limb. Patent G-J.  Ulcer?  No obvious ulcer on ugi.  Will check CT. If ct negative, will consider repeat upper endo Cont protonix Add carafate Start trophic tube feeds - she has never been able to tolerate full TF rate due to discomfort Await vitamin labs   Disposition:  LOS: 1 day  The patient does not meet criteria  for discharge because She has uncontrolled pain and is requiring intravenous medications  [name], MD [phone] [facility]

## 2016-02-21 NOTE — Transfer of Care (Signed)
Immediate Anesthesia Transfer of Care Note  Patient: Larna Daughters  Procedure(s) Performed: Procedure(s): ESOPHAGOGASTRODUODENOSCOPY (EGD) (N/A)  Patient Location: PACU  Anesthesia Type:MAC  Level of Consciousness: awake, alert , oriented and patient cooperative  Airway & Oxygen Therapy: Patient Spontanous Breathing and Patient connected to nasal cannula oxygen  Post-op Assessment: Report given to RN, Post -op Vital signs reviewed and stable and Patient moving all extremities X 4  Post vital signs: stable  Last Vitals:  Vitals:   02/21/16 1432 02/21/16 1624  BP: (!) 142/94   Pulse: 67 98  Resp: 12 13  Temp: 37.2 C     Last Pain:  Vitals:   02/21/16 1432  TempSrc: Oral  PainSc: 2       Patients Stated Pain Goal: 3 ([id number])  Complications: No apparent anesthesia complications

## 2016-02-21 NOTE — Progress Notes (Signed)
Nutrition Follow-up  DOCUMENTATION CODES:   Severe malnutrition in context of chronic illness  INTERVENTION:  - Following EGD today: increase Vital 1.5 to 30 mL/hr which will provide 1080 kcal, 49 grams of protein, and 550 mL free water.  - Will order Boost Breeze po BID, each supplement provides 250 kcal and 9 grams of protein. - Monitor magnesium, potassium, and phosphorus daily for at least 3 days with advancing TF rate MD to replete as needed, as pt is at risk for refeeding syndrome given severe malnutrition and inadequate nutrition PTA. - RD will follow-up 12/17.  NUTRITION DIAGNOSIS:   Malnutrition related to chronic illness as evidenced by energy intake < 75% for > or equal to 1 month, percent weight loss, mild depletion of body fat, mild depletion of muscle mass. -ongoing  GOAL:   Patient will meet greater than or equal to 90% of their needs -unmet with pt NPO for EGD, trickle TF.  MONITOR:   PO intake, Supplement acceptance, Labs, Weight trends, I & O's  ASSESSMENT:    45 year old female presenting status-post bariatric surgery. 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  Her G-tube fell out a few days after surgery and had to be placed by interventional radiology.  Her last office visit was in Nov.  Her weight at that time was 204 pounds.  Her preoperative visit weight was 273 pounds.  Her initial visit weight is 286 pounds.  After that office visit her G-tube became dislodged and she ended up having it repositioned in interventional radiology.  Also she was started back on trophic G-tube feeds at night to help meet nutritional goals since her last visit.  She comes in today as a walk-in.  She is emotional and  crying.  She states that her stomach has been achy for the past 2 days.  She woke up at 4 AM this morning which is worsening stomach "ache".  She has had some nausea.  She states that her upper abdomen is tender.  As a result is now tender around the G-tube.  She does not believe the G-tube has become mispositioned again.  She tried Carafate without relief.  She has been taking her Protonix daily.  She denies any vomiting, fever or chills.  She denies any melena or hematochezia. She states that prior to having issue starting 2 days ago she was doing half a can of tube feeds at night and drinking some throughout the day.  She would drink some thick liquid soup.  Otherwise she was not taking in much by mouth.  12/15 Pt on CLD with no documented intakes since RD assessment yesterday. She has been NPO (for POs, not TF) since midnight for EGD this afternoon. Pt reports that prior to being made NPO she was feeling some abdominal pain consistent with fullness d/t bottle of contrast consumed for CT and medications. Since midnight she has not experienced this pain. She states slight nausea when TF initiated but no feelings of this since that time.   Other care team members arrived shortly after RD visit so discussion with pt was rather brief. Unable to ask pt about trial of Boost Breeze; noted that this was erroneously not ordered yesterday so will place order at this  time and discuss supplement use on 12/17. Will order for increase of TF to 30 mL/hr following EGD today. Goal for TF: Vital 1.5 @ 55 mL/hr. Will continue to monitor tolerance and nutritional status and labs. Question if it would be beneficial for G-tube to be placed in gastric pouch rather than remnant given very poor PO intakes for prolonged period and need for nutrition support to meet >/=75% estimated needs.  Weight from 12/13-12/15 is +1.4 kg; will continue to monitor weight trends with increasing TF rate. IVF providing 2400 mL fluid per day at this  time. Labs for copper, vitamin B12, and zinc checked 12/13 afternoon and were all WDL at that time. Pt with hx of constipation but no bowel regimen ordered at this time.   Medications reviewed; 15 mL oral liquid multivitamin/day, PRN Zofran, 40 mg IV Protnix/day, 1 g Carafate QID. Labs reviewed; CBGs: 85 and 100 mg/dL today, K: 3.4 mmol/L, Ca: 8.4 mg/dL.  IVF: LR @ 100 mL/hr.      12/14 - Pt reports chronic abdominal pain.  - She has been eating poorly over the last couple of days d/t pain.  - Prior to these symptoms pt was infusing 1 can Vital HP overnight (237 kcal, 20g protein) and consuming mainly liquids throughout the day (like soups, water and [employer] milk (1 cup = 120 kcal and 13g protein).  - Taking her vitamin supplements as recommended: [place] liquid multivitamin, liquid Calcium +Vitamin D, B12.  - Patient reports trying different methods to try and tolerate PO intake.  - She has tried to consume sweet liquids and induce dumping syndrome to help with constipation.  - Pt states she has not been consistent in taking Carafate with intake or pain medications.  - Patient does c/o fullness with intakes. - Intake yesterday: 1 cup of chicken broth and 2 cups of ice chips. - Intake today: all of water with Isovue (12 oz) and an icee (4oz). Total today: 16 oz. - Patient willing to try Boost Breeze with clear liquid trays. - Will initiate Vital 1.5 @ 20 ml/hr and will assess for tolerance and ability to advance. - Pt has lost 26 lb since 9/22 (12% wt loss x 2.5 months, significant for time frame).  - Denies problems with vision and brittle nails.  - Reports some bleeding gums with brushing her teeth.  - Nutrition-Focused physical exam completed. Findings are mild fat depletion, mild muscle depletion, and no edema.    Diet Order:  Diet NPO time specified Except for: Sips with Meds  Skin:  Reviewed, no issues  Last BM:  PTA/unknown  Height:   Ht Readings from Last 1 Encounters:   02/19/16 5\' 8"  (1.727 m)    Weight:   Wt Readings from Last 1 Encounters:  02/21/16 193 lb (87.5 kg)    Ideal Body Weight:  63.6 kg  BMI:  Body mass index is 29.35 kg/m.  Estimated Nutritional Needs:   Kcal:  1800-2000  Protein:  75-85g  Fluid:  2L/day  EDUCATION NEEDS:   Education needs addressed    Jarome Matin, MS, RD, LDN, CNSC Inpatient Clinical Dietitian Pager # [phone] After hours/weekend pager # [phone]

## 2016-02-21 NOTE — Op Note (Signed)
[facility] Patient Name: Margaret Fletcher Procedure Date: 02/21/2016 MRN: [id number] Attending MD: [name] MD, MD Date of Birth: 06-Oct-1970 CSN: [id number] Age: 45 Admit Type: Inpatient Procedure:                Upper GI endoscopy Indications:              Epigastric abdominal pain; h/o laparoscopic                            Roux-en-Y gastric bypass May 2017. Postoperatively                            the patient had ongoing issues with upper abdominal                            pain and discomfort and intolerance to eating. She                            underwent diagnostic laparoscopy with resection of                            some of her candycane limb of the Roux limb and                            placement of a gastrotomy tube in her excluded                            stomach in July 2017. Her oral intake has been an                            ongoing issue. She presented with worsening                            epigastric pain earlier this week and was admitted                            to the hospital. Upper GI this week showed a small                            pouch and prompt emptying of contrast into the Roux                            limb. There was no preferential filling of the                            blind end of the candycane on upper GI. Contrast                            immediately filled the Roux limb. CT scan was later                            performed which was also inconclusive and normal Providers:  Leighton Ruff. Redmond Pulling MD, MD, Cleda Daub, RN, Corliss Parish, Technician Referring MD:              Medicines:                Propofol per Anesthesia Complications:            No immediate complications. Estimated blood loss:                            Minimal. Estimated Blood Loss:     Estimated blood loss was minimal. Procedure:                Pre-Anesthesia Assessment:         - Prior to the procedure, a History and Physical                            was performed, and patient medications and                            allergies were reviewed. The patient is competent.                            The risks and benefits of the procedure and the                            sedation options and risks were discussed with the                            patient. All questions were answered and informed                            consent was obtained. Patient identification and                            proposed procedure were verified by the physician,                            the nurse, the anesthetist and the technician in                            the endoscopy suite. Mental Status Examination:                            alert and oriented. Airway Examination: normal                            oropharyngeal airway and neck mobility. Respiratory                            Examination: clear to auscultation. CV Examination:                            normal. ASA  Grade Assessment: II - A patient with                            mild systemic disease. After reviewing the risks                            and benefits, the patient was deemed in                            satisfactory condition to undergo the procedure.                            The anesthesia plan was to use moderate sedation /                            analgesia (conscious sedation). Immediately prior                            to administration of medications, the patient was                            re-assessed for adequacy to receive sedatives. The                            heart rate, respiratory rate, oxygen saturations,                            blood pressure, adequacy of pulmonary ventilation,                            and response to care were monitored throughout the                            procedure. The physical status of the patient was                             re-assessed after the procedure.                           - Prior to the procedure, a History and Physical                            was performed, and patient medications and                            allergies were reviewed. The patient's tolerance of                            previous anesthesia was also reviewed. The risks                            and benefits of the procedure and the sedation  options and risks were discussed with the patient.                            All questions were answered, and informed consent                            was obtained. Prior Anticoagulants: The patient has                            taken Lovenox (enoxaparin), last dose was 1 day                            prior to procedure. ASA Grade Assessment: II - A                            patient with mild systemic disease. After reviewing                            the risks and benefits, the patient was deemed in                            satisfactory condition to undergo the procedure.                           After obtaining informed consent, the endoscope was                            passed under direct vision. Throughout the                            procedure, the patient's blood pressure, pulse, and                            oxygen saturations were monitored continuously. The                            [id number]) scope was introduced through the                            mouth, and advanced to the gastric pouch and                            through the anastomosis into the afferent and                            efferent jejunal loops. The upper GI endoscopy was                            accomplished with ease. The patient tolerated the                            procedure well. Scope In: Scope Out: Findings:      The esophagus was normal. Z line  located at 44 cm. Gastric pouch       appeared normal with normal mucosa however there was a little  bit of       filmy white material in the gastric pouch and just distal to the       gastrojejunal anastomosis on the mucosa of the roux limb mainly toward       the blind end of the roux limb (candy cane). The anastomosis was       visualized at 50 cm. There is no exposed staples. There was no       gastritis. The anastomosis was widely patent. The scope easily traversed       with ease. The blind end of the roux limb (candy cane) was very small       but the scope did go toward this end of the roux limb first. The roux       limb was easily cannulated and appeared normal. biopsy performed of the       gastric pouch mucosa with the thin filmy white material. Impression:               - Normal esophagus.                           - normal appearing roux en y anatomy                           - normal size gastric pouch                           - scant filmy whitish material in gastric pouch and                            blind end of roux limb                           - Biopsies were taken of the gastric pouch with a                            cold forceps for Helicobacter pylori testing and                            analysis Moderate Sedation:      Moderate (conscious) sedation was personally administered by an       anesthesia professional. The following parameters were monitored: oxygen       saturation, heart rate, blood pressure, respiratory rate, EKG, adequacy       of pulmonary ventilation, and response to care. Total physician       intraservice time was 10 minutes. Recommendation:           - Return patient to hospital ward for ongoing care.                           - Return patient to hospital ward for ongoing care.                           - Resume previous diet.                           -  Await pathology results.                           - The findings and recommendations were discussed                            with the patient.                           - Continue  present medications. Procedure Code(s):        --- Professional ---                           [phone], Esophagogastroduodenoscopy, flexible,                            transoral; diagnostic, including collection of                            specimen(s) by brushing or washing, when performed                            (separate procedure) Diagnosis Code(s):        --- Professional ---                           R10.13, Epigastric pain CPT copyright 2016 American Medical Association. All rights reserved. The codes documented in this report are preliminary and upon coder review may  be revised to meet current compliance requirements. Greer Pickerel, MD [name] MD, MD 02/21/2016 4:49:37 PM This report has been signed electronically. Number of Addenda: 0

## 2016-02-21 NOTE — Progress Notes (Signed)
Progress Note: Metabolic and Bariatric Surgery Service   Subjective: Overnight and this morning did better with cont TF. Less epigastric pain. Some discomfort yesterday with oral contrast. Was taking protonix pills intact at home and taking carafate very intermittently  Objective: Vital signs in last 24 hours: Temp:  [98 F (36.7 C)-98.9 F (37.2 C)] 98.9 F (37.2 C) (12/15 1432) Pulse Rate:  [65-67] 67 (12/15 1432) Resp:  [12-18] 12 (12/15 1432) BP: (132-142)/(87-94) 142/94 (12/15 1432) SpO2:  [98 %-100 %] 100 % (12/15 1432) Weight:  [87.5 kg (193 lb)] 87.5 kg (193 lb) (12/15 1432) Last BM Date: 02/14/16  Intake/Output from previous day: 12/14 0701 - 12/15 0700 In: 2935.3 [P.O.:260; I.V.:2408.3; NG/GT:267] Out: 3000 [Urine:3000] Intake/Output this shift: Total I/O In: 1067.3 [I.V.:778.3; Other:120; NG/GT:169] Out: 700 [Urine:700]  Lungs: cta  Cardiovascular: reg  Abd: soft, nd, g tube intact. Min epigastric TTP  Extremities: no edema  Neuro:  Approp, nonfocal, spirits seem better today.   Lab Results: CBC   Recent Labs  02/19/16 1434  WBC 10.6*  HGB 12.1  HCT 36.7  PLT 356   BMET  Recent Labs  02/20/16 0440 02/21/16 0521  NA 141 136  K 3.8 3.4*  CL 111 107  CO2 25 24  GLUCOSE 82 99  BUN 16 8  CREATININE 1.09* 0.99  CALCIUM 8.5* 8.4*   PT/INR No results for input(s): LABPROT, INR in the last 72 hours. ABG No results for input(s): PHART, HCO3 in the last 72 hours.  Invalid input(s): PCO2, PO2  Studies/Results:  Anti-infectives: Anti-infectives    None      Medications: Scheduled Meds: . [MAR Hold] buPROPion  300 mg Oral Daily  . [MAR Hold] enoxaparin (LOVENOX) injection  40 mg Subcutaneous Q24H  . [MAR Hold] feeding supplement  1 Container Oral BID BM  . [MAR Hold] feeding supplement (VITAL 1.5 CAL)  1,000 mL Per Tube Q24H  . [MAR Hold] multivitamin  15 mL Oral Daily  . [MAR Hold] norelgestromin-ethinyl estradiol  1 patch  Transdermal Weekly  . [MAR Hold] pantoprazole (PROTONIX) IV  40 mg Intravenous QHS  . [MAR Hold] sucralfate  1 g Oral TID WC & HS  . [MAR Hold] topiramate  100 mg Oral QHS   Continuous Infusions: . sodium chloride    . lactated ringers Stopped (02/21/16 1352)   PRN Meds:.[MAR Hold] acetaminophen (TYLENOL) oral liquid 160 mg/5 mL, [MAR Hold] ALPRAZolam, [MAR Hold] diphenhydrAMINE **OR** [MAR Hold] diphenhydrAMINE, [MAR Hold]  HYDROmorphone (DILAUDID) injection, [MAR Hold] iopamidol, [MAR Hold] methocarbamol, [MAR Hold] ondansetron **OR** [MAR Hold] ondansetron (ZOFRAN) IV, [MAR Hold] oxyCODONE  Assessment/Plan: Patient Active Problem List   Diagnosis Date Noted  . Epigastric pain 02/19/2016  . Protein-calorie malnutrition, severe 10/08/2015  . Mild protein-calorie malnutrition ([place]) 10/07/2015  . Bacteremia 09/12/2015  . Protein-calorie malnutrition, mild ([place]) 08/19/2015  . Prediabetes 07/08/2015  . Morbid obesity ([place]) 07/08/2015  . Mild obstructive sleep apnea 07/08/2015  . Dyslipidemia 07/08/2015  . S/P gastric bypass 07/08/2015  . IIH (idiopathic intracranial hypertension) 06/03/2015  . Chronic migraine without aura or status migrainosus 04/30/2015  . Worsening headaches 04/30/2015  . Vision changes 04/30/2015  . Perceived hearing changes 04/30/2015  . Liver lesion 03/21/2013  . Splenic cyst 03/21/2013  . Abdominal pain 03/21/2013   s/p Procedure(s): H/o LRYGB 07/2015 H/o revision of candy cane limb and G tube insertion Summer 2017 Ongoing issues with po tolerance now with worsening epigastric pain  ugi looked good-No filling of remaining candy  cane limb. Patent G-J, prompt emptying of gastric pouch.  Has had cholecystectomy in past so not gallbladder.  CT on Thursday showed no apparent explanation for epigastric TTP Plan upper endo today. Will resume cont TF afterwards Dec IVF Zinc, b12, copper levels ok. B1 and vit A levels pending PCMN - prealb 10.7 - oral diet as  tolerated, will increase TF slightly starting tonight Hypokalemia - replaced with liquid kcl via g tube  Discussed risks/benefits of upper endo - bleeding, injury to surrounding structures, perforation, pain, oversedation, aspiration, failure to find abnormality.   Disposition:  LOS: 2 days  The patient does not meet criteria for discharge because She did not meet oral intake goals and is at high risk of dehydration  [name], MD [phone] [facility]

## 2016-02-21 NOTE — Anesthesia Postprocedure Evaluation (Signed)
Anesthesia Post Note  Patient: Margaret Fletcher  Procedure(s) Performed: Procedure(s) (LRB): ESOPHAGOGASTRODUODENOSCOPY (EGD) (N/A)  Patient location during evaluation: Endoscopy Anesthesia Type: MAC Level of consciousness: awake and alert Pain management: pain level controlled Vital Signs Assessment: post-procedure vital signs reviewed and stable Respiratory status: spontaneous breathing, nonlabored ventilation, respiratory function stable and patient connected to nasal cannula oxygen Cardiovascular status: stable and blood pressure returned to baseline Anesthetic complications: no    Last Vitals:  Vitals:   02/21/16 1432 02/21/16 1624  BP: (!) 142/94   Pulse: 67 98  Resp: 12 13  Temp: 37.2 C     Last Pain:  Vitals:   02/21/16 1432  TempSrc: Oral  PainSc: 2                  Catalina Gravel

## 2016-02-21 NOTE — Consult Note (Signed)
   [facility] CM Inpatient Consult   02/21/2016  Margaret Fletcher 05-24-1970 [id number]    Came to visit Margaret Fletcher on behalf of [facility] Care Management program for [employer] employees/dependents with [facility] insurance. Margaret Fletcher is well known to Probation officer. She is agreeable to post hospital discharge call. Confirmed best contact number as [phone]. Advised her to contact [facility] Benefits Line for benefits questions. States she has the number to call. Contact information left at bedside. Appreciative of visit.    Marthenia Rolling, MSN-Ed, RN,BSN [facility] Liaison [phone]

## 2016-02-21 NOTE — Interval H&P Note (Signed)
History and Physical Interval Note:  02/21/2016 3:04 PM  Margaret Fletcher  has presented today for surgery, with the diagnosis of epigastric pain history of gastic bypass  The various methods of treatment have been discussed with the patient and family. After consideration of risks, benefits and other options for treatment, the patient has consented to  Procedure(s): ESOPHAGOGASTRODUODENOSCOPY (EGD) (N/A) as a surgical intervention .  The patient's history has been reviewed, patient examined, no change in status, stable for surgery.  I have reviewed the patient's chart and labs.  Questions were answered to the patient's satisfaction.    Leighton Ruff. Redmond Pulling, MD, [place], Bariatric, & Minimally Invasive Surgery [facility], [place]  [facility]

## 2016-02-21 NOTE — Anesthesia Preprocedure Evaluation (Signed)
Anesthesia Evaluation  Patient identified by MRN, date of birth, ID band Patient awake    Reviewed: Allergy & Precautions, NPO status , Patient's Chart, lab work & pertinent test results  Airway Mallampati: II  TM Distance: >3 FB Neck ROM: Full    Dental no notable dental hx. (+) Teeth Intact   Pulmonary neg pulmonary ROS, sleep apnea and Continuous Positive Airway Pressure Ventilation ,    Pulmonary exam normal breath sounds clear to auscultation       Cardiovascular negative cardio ROS Normal cardiovascular exam Rhythm:Regular Rate:Normal     Neuro/Psych  Headaches, PSYCHIATRIC DISORDERS Anxiety Depression negative neurological ROS  negative psych ROS   GI/Hepatic negative GI ROS, Neg liver ROS, Epigastric pain S/P Gastric bypass   Endo/Other  negative endocrine ROS  Renal/GU negative Renal ROS  negative genitourinary   Musculoskeletal negative musculoskeletal ROS (+)   Abdominal (+) - obese,   Peds  Hematology negative hematology ROS (+)   Anesthesia Other Findings   Reproductive/Obstetrics negative OB ROS                             Anesthesia Physical Anesthesia Plan  ASA: II  Anesthesia Plan: MAC   Post-op Pain Management:    Induction: Intravenous  Airway Management Planned: Natural Airway and Nasal Cannula  Additional Equipment:   Intra-op Plan:   Post-operative Plan:   Informed Consent: I have reviewed the patients History and Physical, chart, labs and discussed the procedure including the risks, benefits and alternatives for the proposed anesthesia with the patient or authorized representative who has indicated his/her understanding and acceptance.     Plan Discussed with: Anesthesiologist, CRNA and Surgeon  Anesthesia Plan Comments:         Anesthesia Quick Evaluation

## 2016-02-22 DIAGNOSIS — K5909 Other constipation: Secondary | ICD-10-CM

## 2016-02-22 DIAGNOSIS — E876 Hypokalemia: Secondary | ICD-10-CM

## 2016-02-22 LAB — GLUCOSE, CAPILLARY
GLUCOSE-CAPILLARY: 103 mg/dL — AB (ref 65–99)
GLUCOSE-CAPILLARY: 85 mg/dL (ref 65–99)
GLUCOSE-CAPILLARY: 88 mg/dL (ref 65–99)
Glucose-Capillary: 50 mg/dL — ABNORMAL LOW (ref 65–99)
Glucose-Capillary: 91 mg/dL (ref 65–99)
Glucose-Capillary: 94 mg/dL (ref 65–99)
Glucose-Capillary: 105 mg/dL — ABNORMAL HIGH (ref 65–99)

## 2016-02-22 LAB — BASIC METABOLIC PANEL
ANION GAP: 7 (ref 5–15)
BUN: 7 mg/dL (ref 6–20)
CHLORIDE: 107 mmol/L (ref 101–111)
CO2: 24 mmol/L (ref 22–32)
Calcium: 8.3 mg/dL — ABNORMAL LOW (ref 8.9–10.3)
Creatinine, Ser: 1 mg/dL (ref 0.44–1.00)
GFR calc non Af Amer: >60 mL/min (ref >60)
Glucose, Bld: 104 mg/dL — ABNORMAL HIGH (ref 65–99)
POTASSIUM: 3.3 mmol/L — AB (ref 3.5–5.1)
Sodium: 138 mmol/L (ref 135–145)

## 2016-02-22 LAB — MAGNESIUM: Magnesium: 2 mg/dL (ref 1.7–2.4)

## 2016-02-22 LAB — PHOSPHORUS: PHOSPHORUS: 3.3 mg/dL (ref 2.5–4.6)

## 2016-02-22 MED ORDER — POTASSIUM CHLORIDE 20 MEQ/15ML (10%) PO SOLN
40.0000 meq | Freq: Every day | ORAL | Status: AC
  Administered 2016-02-22 – 2016-02-24 (×3): 40 meq via ORAL
  Filled 2016-02-22 (×3): qty 30

## 2016-02-22 MED ORDER — SENNOSIDES-DOCUSATE SODIUM 8.6-50 MG PO TABS
1.0000 | ORAL_TABLET | Freq: Two times a day (BID) | ORAL | Status: DC
  Administered 2016-02-22 – 2016-02-24 (×5): 1 via ORAL
  Filled 2016-02-22 (×5): qty 1

## 2016-02-22 MED ORDER — POTASSIUM CHLORIDE CRYS ER 20 MEQ PO TBCR: 40.0000 meq | EXTENDED_RELEASE_TABLET | Freq: Every day | ORAL | Status: DC

## 2016-02-22 MED ORDER — SODIUM CHLORIDE 0.9 % IV SOLN: 250.0000 mL | INTRAVENOUS | Status: DC | PRN

## 2016-02-22 MED ORDER — SODIUM CHLORIDE 0.9% FLUSH
3.0000 mL | Freq: Two times a day (BID) | INTRAVENOUS | Status: DC
  Administered 2016-02-22 – 2016-02-24 (×4): 3 mL via INTRAVENOUS

## 2016-02-22 MED ORDER — SODIUM CHLORIDE 0.9% FLUSH: 3.0000 mL | INTRAVENOUS | Status: DC | PRN

## 2016-02-22 MED ORDER — LACTATED RINGERS IV BOLUS (SEPSIS): 1000.0000 mL | Freq: Three times a day (TID) | INTRAVENOUS | Status: AC | PRN

## 2016-02-22 MED ORDER — POTASSIUM CHLORIDE 20 MEQ PO PACK
40.0000 meq | PACK | Freq: Every day | ORAL | Status: DC
  Filled 2016-02-22: qty 2

## 2016-02-22 MED ORDER — POLYETHYLENE GLYCOL 3350 17 G PO PACK
17.0000 g | PACK | Freq: Two times a day (BID) | ORAL | Status: DC
  Administered 2016-02-22 – 2016-02-24 (×5): 17 g via ORAL
  Filled 2016-02-22 (×5): qty 1

## 2016-02-22 NOTE — Progress Notes (Signed)
[address] [place], [place] [address] Phone: [phone] FAX: [place] [phone] May 05, 1970    Problem List:   Active Problems:   Epigastric pain   1 Day Post-Op  02/19/2016 - 02/21/2016  Procedure(s): ESOPHAGOGASTRODUODENOSCOPY (EGD)  Impression:                  - Normal esophagus.                           - normal appearing roux en y anatomy                           - normal size gastric pouch                           - scant filmy whitish material in gastric pouch and                            blind end of roux limb                           - Biopsies were taken of the gastric pouch with a                            cold forceps for Helicobacter pylori testing and                            analysis  Assessment  Stable with chronic poor by mouth tolerance and epigastric pain.  Plan:  -Bariatric clear liquid diet.  Advance to full's is tolerated.  Calorie counts.  Tube feeds for moderate malnutrition.  Get up and mobilize.  Pain control.  Bowel regimen.  Follow-up on biopsies.  Replace K  -VTE prophylaxis- SCDs, etc  -mobilize as tolerated to help recovery  Adin Hector, M.D., F.A.C.S. Gastrointestinal and Minimally Invasive Surgery [facility] [address] [place], [place] [address] [phone] Main / Paging   02/22/2016  CARE TEAM:  PCP: [name], MD  Outpatient Care Team: Patient Care Team: Shawnee Knapp, MD as PCP - General (Family Medicine)  Inpatient Treatment Team: Treatment Team: Attending Provider: Greer Pickerel, MD; Technician: Tenna Child, NT; Registered Nurse: Nolene Ebbs, RN; Registered Nurse: Celedonio Savage, RN; Technician: Sueanne Margarita, NT  Subjective:  Pain less.  Trying clears but prefers to just nibble on ice chips.  No bowel movement in a week.  Husband resting  at bedside.  Objective:  Vital signs:  Vitals:   02/21/16 1630 02/21/16 1715 02/21/16 2205 02/22/16 0546  BP: 126/72 (!) 150/99 (!) 106/57 117/81  Pulse: 88 73 (!) 58 66  Resp: _0 Temp:  98.7 F (37.1 C) 97.8 F (36.6 C) 98.5 F (36.9 C)  TempSrc:  Oral Oral Oral  SpO2: 100% 100% 97% 100%  Weight:      Height:        Last BM Date: 02/14/16  Intake/Output   Yesterday:  12/15 0701 - 12/16 0700 In: 2668.3 [I.V.:1910.8; NG/GT:547.5] Out: 1700 [Urine:1700] This shift:  No intake/output data recorded.  Bowel function:  Flatus: No  BM:  No  Drain: (No drain)   Physical Exam:  General: Pt awake/alert/oriented x4 in No acute distress Eyes: PERRL, normal EOM.  Sclera clear.  No icterus Neuro: CN II-XII intact w/o focal sensory/motor deficits. Lymph: No head/neck/groin lymphadenopathy Psych:  No delerium/psychosis/paranoia HENT: Normocephalic, Mucus membranes moist.  No thrush Neck: Supple, No tracheal deviation Chest: No chest wall pain w good excursion CV:  Pulses intact.  Regular rhythm MS: Normal AROM mjr joints.  No obvious deformity Abdomen: Soft.  Nondistended.  Nontender.  No evidence of peritonitis.  No incarcerated hernias.  Left upper quadrant gastrostomy tube site clean.  Ext:  SCDs BLE.  No mjr edema.  No cyanosis Skin: No petechiae / purpura  Results:   Labs: Results for orders placed or performed during the hospital encounter of 02/19/16 (from the past 48 hour(s))  Glucose, capillary     Status: None   Collection Time: 02/20/16  6:10 PM  Result Value Ref Range   Glucose-Capillary 99 65 - 99 mg/dL  Glucose, capillary     Status: None   Collection Time: 02/20/16  7:42 PM  Result Value Ref Range   Glucose-Capillary 95 65 - 99 mg/dL   Comment 1 Notify RN   Glucose, capillary     Status: None   Collection Time: 02/20/16 11:57 PM  Result Value Ref Range   Glucose-Capillary 83 65 - 99 mg/dL   Comment 1 Notify RN   Glucose, capillary      Status: None   Collection Time: 02/21/16  3:50 AM  Result Value Ref Range   Glucose-Capillary 85 65 - 99 mg/dL   Comment 1 Notify RN   Prealbumin     Status: Abnormal   Collection Time: 02/21/16  5:21 AM  Result Value Ref Range   Prealbumin 10.7 (L) 18 - 38 mg/dL    Comment: Performed at [place] metabolic panel     Status: Abnormal   Collection Time: 02/21/16  5:21 AM  Result Value Ref Range   Sodium 136 135 - 145 mmol/L   Potassium 3.4 (L) 3.5 - 5.1 mmol/L   Chloride 107 101 - 111 mmol/L   CO2 24 22 - 32 mmol/L   Glucose, Bld 99 65 - 99 mg/dL   BUN 8 6 - 20 mg/dL   Creatinine, Ser 0.99 0.44 - 1.00 mg/dL   Calcium 8.4 (L) 8.9 - 10.3 mg/dL   GFR calc non Af Amer >60 >60 mL/min   GFR calc Af Amer >60 >60 mL/min    Comment: (NOTE) The eGFR has been calculated using the CKD EPI equation. This calculation has not been validated in all clinical situations. eGFR's persistently <60 mL/min signify possible Chronic Kidney Disease.    Anion gap 5 5 - 15  Glucose, capillary     Status: Abnormal   Collection Time: 02/21/16  8:04 AM  Result Value Ref Range   Glucose-Capillary 100 (H) 65 - 99 mg/dL  Glucose, capillary     Status: None   Collection Time: 02/21/16 12:19 PM  Result Value Ref Range   Glucose-Capillary 78 65 - 99 mg/dL  Glucose, capillary     Status: Abnormal   Collection Time: 02/21/16  5:37 PM  Result Value Ref Range   Glucose-Capillary 112 (H) 65 - 99 mg/dL  Glucose, capillary     Status: None   Collection Time: 02/21/16  7:36 PM  Result Value Ref Range   Glucose-Capillary 91 65 -  99 mg/dL  Glucose, capillary     Status: None   Collection Time: 02/22/16 12:05 AM  Result Value Ref Range   Glucose-Capillary 85 65 - 99 mg/dL  Glucose, capillary     Status: None   Collection Time: 02/22/16  4:06 AM  Result Value Ref Range   Glucose-Capillary 91 65 - 99 mg/dL  Basic metabolic panel     Status: Abnormal   Collection Time: 02/22/16  4:37 AM  Result  Value Ref Range   Sodium 138 135 - 145 mmol/L   Potassium 3.3 (L) 3.5 - 5.1 mmol/L   Chloride 107 101 - 111 mmol/L   CO2 24 22 - 32 mmol/L   Glucose, Bld 104 (H) 65 - 99 mg/dL   BUN 7 6 - 20 mg/dL   Creatinine, Ser 1.00 0.44 - 1.00 mg/dL   Calcium 8.3 (L) 8.9 - 10.3 mg/dL   GFR calc non Af Amer >60 >60 mL/min   GFR calc Af Amer >60 >60 mL/min    Comment: (NOTE) The eGFR has been calculated using the CKD EPI equation. This calculation has not been validated in all clinical situations. eGFR's persistently <60 mL/min signify possible Chronic Kidney Disease.    Anion gap 7 5 - 15  Magnesium     Status: None   Collection Time: 02/22/16  4:37 AM  Result Value Ref Range   Magnesium 2.0 1.7 - 2.4 mg/dL  Phosphorus     Status: None   Collection Time: 02/22/16  4:37 AM  Result Value Ref Range   Phosphorus 3.3 2.5 - 4.6 mg/dL  Glucose, capillary     Status: Abnormal   Collection Time: 02/22/16  7:35 AM  Result Value Ref Range   Glucose-Capillary 103 (H) 65 - 99 mg/dL    Imaging / Studies: Ct Abdomen Pelvis W Contrast  Result Date: 02/20/2016 CLINICAL DATA:  45 year old female with epigastric pain. 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 Initial encounter. EXAM: CT ABDOMEN AND PELVIS WITH CONTRAST TECHNIQUE: Multidetector CT imaging of the abdomen and pelvis was performed using the standard protocol following bolus administration of intravenous contrast. CONTRAST:  114m ISOVUE-300 IOPAMIDOL (ISOVUE-300) INJECTION 61% COMPARISON:  CT Abdomen and Pelvis 01/30/2016 and earlier. FINDINGS: Lower chest: Negative.  No pericardial or pleural effusion. No upper abdominal free air. Hepatobiliary: Surgically absent gallbladder. Negative liver. No biliary ductal enlargement. Pancreas: Negative. Spleen: Negative. Adrenals/Urinary Tract: Negative adrenal glands. Kidneys appears stable. Bilateral renal enhancement and contrast excretion is normal.  Unremarkable urinary bladder. Stomach/Bowel: Gas distended rectum. Mild retained stool in the sigmoid colon. Oral contrast has reached the descending colon which is redundant. Negative transverse colon. Negative right colon. Diminutive or absent appendix. Postoperative changes to the stomach and small bowel with percutaneous gastrostomy tube in place today. This tube is in the distal/bypassed portion of the stomach. Gastrojejunostomy changes and left abdominal small bowel anastomosis again demonstrated. Fluid-filled but nondilated small bowel in the left abdomen. The duodenum and ligament of Treitz are decompressed. No abdominal free air or free fluid. Vascular/Lymphatic: Major arterial structures are patent. Grossly patent portal venous system. No lymphadenopathy. Reproductive: Fibroid uterus appears stable. Other: No pelvic free fluid. Musculoskeletal: Stable visualized osseous structures. Lumbosacral junction disc and endplate degeneration. IMPRESSION: 1. No bowel obstruction, oral contrast has reached the descending colon. Gastrostomy tube again in place in the distal/bypassed portion of the stomach, which is decompressed along with the duodenum. Sequelae of Roux-en-Y with fluid-filled but  nondilated left abdominal small bowel loops. 2. No acute or inflammatory process identified. Electronically Signed   By: Genevie Ann M.D.   On: 02/20/2016 14:56    Medications / Allergies: per chart  Antibiotics: Anti-infectives    None        Note: Portions of this report may have been transcribed using voice recognition software. Every effort was made to ensure accuracy; however, inadvertent computerized transcription errors may be present.   Any transcriptional errors that result from this process are unintentional.     Adin Hector, M.D., F.A.C.S. Gastrointestinal and Minimally Invasive Surgery [facility] [address] [place], [place] [address] [phone] Main  / Paging   02/22/2016

## 2016-02-23 LAB — GLUCOSE, CAPILLARY
GLUCOSE-CAPILLARY: 86 mg/dL (ref 65–99)
GLUCOSE-CAPILLARY: 75 mg/dL (ref 65–99)
GLUCOSE-CAPILLARY: 95 mg/dL (ref 65–99)
Glucose-Capillary: 75 mg/dL (ref 65–99)
Glucose-Capillary: 81 mg/dL (ref 65–99)
Glucose-Capillary: 88 mg/dL (ref 65–99)

## 2016-02-23 MED ORDER — ENSURE ENLIVE PO LIQD
237.0000 mL | Freq: Two times a day (BID) | ORAL | Status: DC
  Administered 2016-02-23 – 2016-02-24 (×2): 237 mL via ORAL

## 2016-02-23 MED ORDER — LINACLOTIDE 145 MCG PO CAPS
145.0000 ug | ORAL_CAPSULE | Freq: Every day | ORAL | Status: DC
  Administered 2016-02-23 – 2016-02-24 (×2): 145 ug via ORAL
  Filled 2016-02-23 (×3): qty 1

## 2016-02-23 MED ORDER — BOOST PLUS PO LIQD
237.0000 mL | ORAL | Status: DC
  Administered 2016-02-23: 237 mL via ORAL
  Filled 2016-02-23 (×2): qty 237

## 2016-02-23 MED ORDER — OXYCODONE HCL 5 MG/5ML PO SOLN
2.5000 mg | ORAL | Status: DC | PRN
  Administered 2016-02-23: 2.5 mg via ORAL
  Filled 2016-02-23: qty 5

## 2016-02-23 NOTE — Progress Notes (Signed)
Nutrition Follow-up  DOCUMENTATION CODES:   Severe malnutrition in context of chronic illness  INTERVENTION:  - Monitor magnesium, potassium, and phosphorus daily for at least 3 days with advancing TF rate MD to replete as needed, as pt is at risk for refeeding syndrome given severe malnutrition and inadequate nutrition PTA.  Question if it would be beneficial for G-tube to be placed in gastric pouch or have tube placed postpylorically rather than in the remnant given very poor PO intakes for prolonged period and need for nutrition support to meet >/=75% estimated needs.  - Continue Vital 1.5 to 30 mL/hr which will provide 1080 kcal, 49 grams of protein, and 550 mL free water. Reviewed plan with RN. - D/cBoost Breeze. - Provide trial of Boost Plus, each supplement provides 360 kcal and 14 grams of protein. - Provide trial of Ensure Enlive , each supplement provides 350 kcal and 20 grams of protein -CALORIE COUNT in progress, see Calorie Count note  NUTRITION DIAGNOSIS:   Malnutrition related to chronic illness as evidenced by energy intake < 75% for > or equal to 1 month, percent weight loss, mild depletion of body fat, mild depletion of muscle mass.  Ongoing.  GOAL:   Patient will meet greater than or equal to 90% of their needs  Not meeting.  MONITOR:   PO intake, Supplement acceptance, Labs, Weight trends, I & O's  REASON FOR ASSESSMENT:   Consult Calorie Count  ASSESSMENT:    45 year old female presenting status-post bariatric surgery. 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  Her G-tube fell out a few days after surgery and had to be placed by interventional radiology.  Her last office visit was in  Nov.  Her weight at that time was 204 pounds.  Her preoperative visit weight was 273 pounds.  Her initial visit weight is 286 pounds.  After that office visit her G-tube became dislodged and she ended up having it repositioned in interventional radiology.  Also she was started back on trophic G-tube feeds at night to help meet nutritional goals since her last visit.  She comes in today as a walk-in.  She is emotional and crying.  She states that her stomach has been achy for the past 2 days.  She woke up at 4 AM this morning which is worsening stomach "ache".  She has had some nausea.  She states that her upper abdomen is tender.  As a result is now tender around the G-tube.  She does not believe the G-tube has become mispositioned again.  She tried Carafate without relief.  She has been taking her Protonix daily.  She denies any vomiting, fever or chills.  She denies any melena or hematochezia. She states that prior to having issue starting 2 days ago she was doing half a can of tube feeds at night and drinking some throughout the day.  She would drink some thick liquid soup.  Otherwise she was not taking in much by mouth.  Patient reports feeling full with daily doses of medications. Currently not receiving any meds via IV. Spoke with pt's RN, who states that with the combination of all the medications the pt receives at one time, pt is receiving ~10-12 oz of fluid. Pt reports  not wanting to take in any liquids and requested TF was stopped after medications were given d/t the feelings of fullness. May need to consider IV administration or separating medications to decrease the volumes.  Pt states she was tolerating Vital 1.5 @ 20 ml/hr with no issues and states she believes she would tolerate 30 ml/hr if medications dosage was decreased. RD will restart Vital 1.5 @ 30 ml/hr, pt agreed to this. Notified RN of plan.   Pt states she did not like the Boost Breeze supplements but is interested in trying regular  Boost Plus or Ensure. RD will order for pt to try.   Per results of UGI 12/15: results are normal. The pouch showed prompt emptying into the Roux limb. No gastritis present.  Patient reports 2 loose stools today and seemed concerned about them. This is the first BM since 12/8 per patient.   Will continue to monitor for TF tolerance and PO intakes.  Medications: OSCAL w/ D tablet TID, Linzess capsule daily, Liquid MVI,  Protonix suspension daily, Miralax BID, KCl solution daily, Senokot-S tablet BID, Carafate suspension QID, Thiamine tablet daily, Zofran-ODT PRN Labs reviewed: CBGs: 81-86 Low K Mg/Phos WNL  Diet Order:  Diet full liquid Room service appropriate? Yes; Fluid consistency: Thin  Skin:  Reviewed, no issues  Last BM:  12/17 -loose stools  Height:   Ht Readings from Last 1 Encounters:  02/21/16 5\' 8"  (1.727 m)    Weight:   Wt Readings from Last 1 Encounters:  02/21/16 193 lb (87.5 kg)    Ideal Body Weight:  63.6 kg  BMI:  Body mass index is 29.35 kg/m.  Estimated Nutritional Needs:   Kcal:  1800-2000  Protein:  75-85g  Fluid:  2L/day  EDUCATION NEEDS:   Education needs addressed  Clayton Bibles, MS, RD, LDN Pager: [phone] After Hours Pager: [phone]

## 2016-02-23 NOTE — Progress Notes (Signed)
Calorie Count Note  48 hour calorie count ordered. Day 1 results below.  Diet: full liquids Supplements: Boost Breeze po TID, each supplement provides 250 kcal and 9 grams of protein  12/16-12/17: Breakfast 12/17: 7 kcal, 0g protein Lunch: 0% Dinner 12/16: 277 kcal, 9g protein Supplements: 0%  Estimated Nutritional Needs:  Kcal:  1800-2000 Protein:  75-85g Fluid:  2L/day  Total intake: 284 kcal (16% of minimum estimated needs)  9g protein (12% of minimum estimated needs)  Nutrition Dx:  Malnutrition related to chronic illness as evidenced by energy intake < 75% for > or equal to 1 month, percent weight loss, mild depletion of body fat, mild depletion of muscle mass.  Goal: Pt to meet >/= 90% of their estimated nutrition needs   Intervention:  -Continue Tube feeding -D/c Boost Breeze per pt request -Provide trial of Boost Plus, each supplement provides 360 kcal and 14 grams of protein. - Provide trial of Ensure Enlive , each supplement provides 350 kcal and 20 grams of protein  Clayton Bibles, MS, RD, LDN Pager: [phone] After Hours Pager: [phone]

## 2016-02-23 NOTE — Progress Notes (Signed)
[address] [place], [place] [address] Phone: [phone] FAX: [place] [phone] Aug 15, 1970    Problem List:   Active Problems:   IIH (idiopathic intracranial hypertension)   Mild obstructive sleep apnea   S/P gastric bypass   Protein-calorie malnutrition, moderate (HCC)   Epigastric pain   Hypokalemia   Constipation, chronic   Chronic nausea   2 Days Post-Op  02/21/2016  Procedure(s): ESOPHAGOGASTRODUODENOSCOPY (EGD)  Impression:                  - Normal esophagus.                           - normal appearing roux en y anatomy                           - normal size gastric pouch                           - scant filmy whitish material in gastric pouch and                            blind end of roux limb                           - Biopsies were taken of the gastric pouch with a                            cold forceps for Helicobacter pylori testing and                            analysis  Assessment  Stable with chronic poor by mouth tolerance and epigastric pain.  Plan:  -Bariatric full liquids as tolerated. - a  Little better  Calorie counts.  Tube feeds for moderate malnutrition.  Bloating - retry later today.  ?switch type?  Retry to avoid worsening malnutrition  Get up and mobilize.  Pain control.  Hopefully able to wean off IV medications  Bowel regimen.  Follow-up on biopsies.  Replace K  -VTE prophylaxis- SCDs, etc  -mobilize as tolerated to help recovery  Adin Hector, M.D., F.A.C.S. Gastrointestinal and Minimally Invasive Surgery [facility] [address] [place], [place] [address] [phone] Main / Paging   02/23/2016  CARE TEAM:  PCP: [name], MD  Outpatient Care Team: Patient Care Team: Shawnee Knapp, MD as PCP - General (Family Medicine)  Inpatient Treatment Team: Treatment Team: Attending  Provider: Greer Pickerel, MD; Technician: Tenna Child, NT; Registered Nurse: Nolene Ebbs, RN; Registered Nurse: Celedonio Savage, RN; Technician: Sueanne Margarita, NT  Subjective:  Pain less.  Trying fulls - nausea less.  Fullness/bloating - stopped TFs  No bowel movement in a week.  Husband resting at bedside.  Objective:  Vital signs:  Vitals:   02/22/16 0546 02/22/16 1548 02/22/16 2221 02/23/16 0508  BP: 117/81 109/76 112/69 (!) 105/55  Pulse: 66 74 (!) 57 67  Resp: '14 15 16 18  '$ Temp: 98.5 F (36.9 C) 99 F (37.2 C) 98.2 F (36.8 C) 98.4 F (36.9 C)  TempSrc: Oral  Oral Oral Oral  SpO2: 100% 99% 98% 100%  Weight:      Height:        Last BM Date: 02/14/16  Intake/Output   Yesterday:  12/16 0701 - 12/17 0700 In: 383 [NG/GT:292] Out: 2100 [Urine:2100] This shift:  No intake/output data recorded.  Bowel function:  Flatus: No  BM:  No  Drain: (No drain)   Physical Exam:  General: Pt awake/alert/oriented x4 in No acute distress Eyes: PERRL, normal EOM.  Sclera clear.  No icterus Neuro: CN II-XII intact w/o focal sensory/motor deficits. Lymph: No head/neck/groin lymphadenopathy Psych:  No delerium/psychosis/paranoia HENT: Normocephalic, Mucus membranes moist.  No thrush Neck: Supple, No tracheal deviation Chest: No chest wall pain w good excursion CV:  Pulses intact.  Regular rhythm MS: Normal AROM mjr joints.  No obvious deformity Abdomen: Soft.  Nondistended.  Nontender.  No evidence of peritonitis.  No incarcerated hernias.  Left upper quadrant gastrostomy tube site clean.  Ext:  SCDs BLE.  No mjr edema.  No cyanosis Skin: No petechiae / purpura  Results:   Labs: Results for orders placed or performed during the hospital encounter of 02/19/16 (from the past 48 hour(s))  Glucose, capillary     Status: Abnormal   Collection Time: 02/21/16  8:04 AM  Result Value Ref Range   Glucose-Capillary 100 (H) 65 - 99 mg/dL  Glucose,  capillary     Status: None   Collection Time: 02/21/16 12:19 PM  Result Value Ref Range   Glucose-Capillary 78 65 - 99 mg/dL  Glucose, capillary     Status: Abnormal   Collection Time: 02/21/16  5:00 PM  Result Value Ref Range   Glucose-Capillary 50 (L) 65 - 99 mg/dL  Glucose, capillary     Status: Abnormal   Collection Time: 02/21/16  5:37 PM  Result Value Ref Range   Glucose-Capillary 112 (H) 65 - 99 mg/dL  Glucose, capillary     Status: None   Collection Time: 02/21/16  7:36 PM  Result Value Ref Range   Glucose-Capillary 91 65 - 99 mg/dL  Glucose, capillary     Status: None   Collection Time: 02/22/16 12:05 AM  Result Value Ref Range   Glucose-Capillary 85 65 - 99 mg/dL  Glucose, capillary     Status: None   Collection Time: 02/22/16  4:06 AM  Result Value Ref Range   Glucose-Capillary 91 65 - 99 mg/dL  Basic metabolic panel     Status: Abnormal   Collection Time: 02/22/16  4:37 AM  Result Value Ref Range   Sodium 138 135 - 145 mmol/L   Potassium 3.3 (L) 3.5 - 5.1 mmol/L   Chloride 107 101 - 111 mmol/L   CO2 24 22 - 32 mmol/L   Glucose, Bld 104 (H) 65 - 99 mg/dL   BUN 7 6 - 20 mg/dL   Creatinine, Ser 1.00 0.44 - 1.00 mg/dL   Calcium 8.3 (L) 8.9 - 10.3 mg/dL   GFR calc non Af Amer >60 >60 mL/min   GFR calc Af Amer >60 >60 mL/min    Comment: (NOTE) The eGFR has been calculated using the CKD EPI equation. This calculation has not been validated in all clinical situations. eGFR's persistently <60 mL/min signify possible Chronic Kidney Disease.    Anion gap 7 5 - 15  Magnesium     Status: None   Collection Time: 02/22/16  4:37 AM  Result Value Ref Range   Magnesium 2.0 1.7 - 2.4 mg/dL  Phosphorus     Status: None   Collection Time: 02/22/16  4:37 AM  Result Value Ref Range   Phosphorus 3.3 2.5 - 4.6 mg/dL  Glucose, capillary     Status: Abnormal   Collection Time: 02/22/16  7:35 AM  Result Value Ref Range   Glucose-Capillary 103 (H) 65 - 99 mg/dL  Glucose,  capillary     Status: None   Collection Time: 02/22/16 12:36 PM  Result Value Ref Range   Glucose-Capillary 94 65 - 99 mg/dL  Glucose, capillary     Status: None   Collection Time: 02/22/16  5:15 PM  Result Value Ref Range   Glucose-Capillary 88 65 - 99 mg/dL  Glucose, capillary     Status: Abnormal   Collection Time: 02/22/16  7:51 PM  Result Value Ref Range   Glucose-Capillary 105 (H) 65 - 99 mg/dL  Glucose, capillary     Status: None   Collection Time: 02/23/16 12:38 AM  Result Value Ref Range   Glucose-Capillary 88 65 - 99 mg/dL  Glucose, capillary     Status: None   Collection Time: 02/23/16  3:55 AM  Result Value Ref Range   Glucose-Capillary 75 65 - 99 mg/dL    Imaging / Studies: No results found.  Medications / Allergies: per chart  Antibiotics: Anti-infectives    None        Note: Portions of this report may have been transcribed using voice recognition software. Every effort was made to ensure accuracy; however, inadvertent computerized transcription errors may be present.   Any transcriptional errors that result from this process are unintentional.     Adin Hector, M.D., F.A.C.S. Gastrointestinal and Minimally Invasive Surgery [facility] [address], [place], [place] [address] [phone] Main / Paging   02/23/2016

## 2016-02-23 NOTE — Progress Notes (Signed)
Pt requested tube feedings be stopped due to feeling too full. Pt is also having some abdominal pain at the time and wants to leave feedings off for a while. Will continue to monitor pt.  Margaret Fletcher

## 2016-02-24 ENCOUNTER — Encounter (HOSPITAL_COMMUNITY): Payer: Self-pay | Admitting: General Surgery

## 2016-02-24 LAB — GLUCOSE, CAPILLARY
Glucose-Capillary: 88 mg/dL (ref 65–99)
Glucose-Capillary: 91 mg/dL (ref 65–99)
Glucose-Capillary: 96 mg/dL (ref 65–99)

## 2016-02-24 MED ORDER — LINACLOTIDE 145 MCG PO CAPS: 145.0000 ug | ORAL_CAPSULE | Freq: Every day | ORAL | 0 refills | Status: DC

## 2016-02-24 MED ORDER — ONDANSETRON 4 MG PO TBDP: 4.0000 mg | ORAL_TABLET | Freq: Four times a day (QID) | ORAL | 0 refills | Status: DC | PRN

## 2016-02-24 MED ORDER — OXYCODONE HCL 5 MG/5ML PO SOLN: 2.5000 mg | Freq: Four times a day (QID) | ORAL | 0 refills | Status: DC | PRN

## 2016-02-24 MED ORDER — SUCRALFATE 1 G PO TABS: 1.0000 g | ORAL_TABLET | Freq: Three times a day (TID) | ORAL | 1 refills | Status: DC

## 2016-02-24 MED ORDER — VITAL HIGH PROTEIN PO LIQD: 1000.0000 mL | ORAL | 8 refills | Status: DC

## 2016-02-24 MED ORDER — PANTOPRAZOLE SODIUM 40 MG PO TBEC: 40.0000 mg | DELAYED_RELEASE_TABLET | Freq: Every day | ORAL | 1 refills | Status: DC

## 2016-02-24 MED FILL — PANTOPRAZOLE SOD DR 40 MG T: 40 | 30 days supply | Qty: 30 | Fill #0

## 2016-02-24 MED FILL — LINZESS 145 MCG CAPSULE: 145 | 30 days supply | Qty: 30 | Fill #0

## 2016-02-24 MED FILL — ONDANSETRON ODT 4 MG TABLET: 4 | 8 days supply | Qty: 30 | Fill #0

## 2016-02-24 MED FILL — CARAFATE 1 GM/10 ML SUSP: 1 | 22 days supply | Qty: 900 | Fill #0

## 2016-02-24 NOTE — Discharge Summary (Signed)
Physician Discharge Summary  Margaret Fletcher [id number] DOB: Jul 03, 1970 DOA: 02/19/2016  PCP: [name], MD  Admit date: 02/19/2016 Discharge date: 02/24/2016  Recommendations for Outpatient Follow-up:  1. Home health nursing for tube feeds at night  Follow-up [place], MD Follow up.   Specialty:  General Surgery Why:  keep january appt as scheduled Contact information: [address] [place] [address] [phone]          Discharge Diagnoses:  Active Problems:   IIH (idiopathic intracranial hypertension)   Mild obstructive sleep apnea   S/P gastric bypass   Protein-calorie malnutrition, moderate (HCC)   Epigastric pain   Hypokalemia   Constipation, chronic   Chronic nausea 1.   Surgical Procedure: none  Discharge Condition: good Disposition: home  Diet recommendation: regular diet as tolerated  Filed Weights   02/19/16 1500 02/21/16 0605 02/21/16 1432  Weight: 86.1 kg (189 lb 14.4 oz) 87.5 kg (193 lb) 87.5 kg (193 lb)    History of present illness:  The patient is a 45 year old female presenting status-post bariatric surgery. 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 Her G-tube fell out a few days after surgery and had to be placed by interventional radiology. Her last office visit was in Nov. Her weight at that time was 204 pounds. Her preoperative visit weight was 273 pounds. Her initial visit weight is 286 pounds. After that office visit her G-tube became dislodged and she ended up having it repositioned in interventional radiology. Also she was started back on trophic G-tube feeds at night to help meet nutritional goals since her last visit. She  comes in today as a walk-in. She is emotional and crying. She states that her stomach has been achy for the past 2 days. She woke up at 4 AM this morning which is worsening stomach "ache". She has had some nausea. She states that her upper abdomen is tender. As a result is now tender around the G-tube. She does not believe the G-tube has become mispositioned again. She tried Carafate without relief. She has been taking her Protonix daily. She denies any vomiting, fever or chills. She denies any melena or hematochezia. She states that prior to having issue starting 2 days ago she was doing half a can of tube feeds at night and drinking some throughout the day. She would drink some thick liquid soup. Otherwise she was not taking in much by mouth. She also reports that while she is having bowel movements she is having to strain in order to evacuate. Her bowel movements are not hard she is just having to strain a lot.  Hospital Course:  The patient was admitted and started on IV fluids and supplemental vitamins. She was continued on antibiotics and reflux medication. She underwent an upper GI which was normal. This is followed by a CT scan which found no evidence to explain her epigastric pain. I then recommended undergoing upper endoscopy to evaluate for ulcer or reflux. Upper endoscopy was performed Friday afternoon which showed no ulcer or gastric mucosa irritation. There is no obstruction at her anastomosis. It was widely patent. Roux limb was easily cannulated. On her upper GI, contrast went into her pouch and directly  into her Roux limb with hardly any filling of the candycane limb. She was resumed on continuous tube feeds at a low rate. This was continued over the weekend. On Monday she felt better. Her epigastric pain had resolved. I had started her on linzess for constipation and she started having some bowel movements. She felt ready for discharge. She will go home on cyclic enteral tube  feeds through her gastrostomy tube which is in her excluded stomach at night. She can continue to take food by mouth as tolerated we will continue PPI, Carafate therapy empirically because this seems to help her symptomatically. She already has a follow-up appointment scheduled with me in January  BP 119/74 (BP Location: Left Arm)   Pulse 62   Temp 98.3 F (36.8 C) (Oral)   Resp 18   Ht 5\' 8"  (1.727 m)   Wt 87.5 kg (193 lb)   SpO2 100%   BMI 29.35 kg/m   Gen: alert, NAD, non-toxic appearing Pupils: equal, no scleral icterus Pulm: Lungs clear to auscultation, symmetric chest rise CV: regular rate and rhythm Abd: soft, nontender, nondistended. Well-healed trocar sites. No cellulitis. No incisional hernia, g tube intact.  Ext: no edema, no calf tenderness Skin: no rash, no jaundice    Discharge Instructions  Discharge Instructions    AMB Referral to [facility]    Complete by:  As directed    Please assign [facility] member for post discharge call. Currently at [facility] Marthenia Rolling, [place], RN,BSN-[facility] [phone]   Reason for consult:  Please assign UMR member for post discharge call   Expected date of contact:  1-3 days (reserved for hospital discharges)   Ambulate hourly while awake    Complete by:  As directed    Call MD for:  difficulty breathing, headache or visual disturbances    Complete by:  As directed    Call MD for:  persistant dizziness or light-headedness    Complete by:  As directed    Call MD for:  persistant nausea and vomiting    Complete by:  As directed    Call MD for:  redness, tenderness, or signs of infection (pain, swelling, redness, odor or green/yellow discharge around incision site)    Complete by:  As directed    Call MD for:  severe uncontrolled pain    Complete by:  As directed    Call MD for:  temperature >101 F    Complete by:  As directed    Diet bariatric full liquid    Complete by:  As directed     Discharge instructions    Complete by:  As directed    See bariatric discharge instructions   Incentive spirometry    Complete by:  As directed    Perform hourly while awake     Allergies as of 02/24/2016      Reactions   Sulfa Antibiotics Anaphylaxis   Zosyn [piperacillin Sod-tazobactam So] Rash      Medication List    STOP taking these medications   fluconazole 150 MG tablet Commonly known as:  DIFLUCAN   lactulose 10 GM/15ML solution Commonly known as:  CHRONULAC   polyethylene glycol packet Commonly known as:  MIRALAX / GLYCOLAX   rizatriptan 10 MG disintegrating tablet Commonly known as:  MAXALT-MLT     TAKE these medications   ALPRAZolam 0.5 MG tablet Commonly known as:  XANAX Take 1 tablet (0.5 mg total) by mouth 2 (two) times daily  as needed for anxiety. Can take 1-2 30 minutes before MRI. Do not drive.   buPROPion 300 MG 24 hr tablet Commonly known as:  WELLBUTRIN XL Take 1 tablet (300 mg total) by mouth daily.   CALCIUM-VITAMIN D PO Take 15 mLs by mouth 3 (three) times daily.   feeding supplement (VITAL HIGH PROTEIN) Liqd liquid Place 1,000 mLs into feeding tube daily. 66ml/hr for 8hrs a day as needed for poor oral intake; 300 ml free water q4hr What changed:  additional instructions   linaclotide 145 MCG Caps capsule Commonly known as:  LINZESS Take 1 capsule (145 mcg total) by mouth daily before breakfast. Start taking on:  02/25/2016   multivitamin Liqd Take 5 mLs by mouth 2 (two) times daily.   ondansetron 4 MG disintegrating tablet Commonly known as:  ZOFRAN-ODT Take 1 tablet (4 mg total) by mouth every 6 (six) hours as needed for nausea.   oxyCODONE 5 MG/5ML solution Commonly known as:  ROXICODONE Take 2.5 mLs (2.5 mg total) by mouth every 6 (six) hours as needed for moderate pain or severe pain. What changed:  Another medication with the same name was removed. Continue taking this medication, and follow the directions you see here.    pantoprazole 40 MG tablet Commonly known as:  PROTONIX Take 1 tablet (40 mg total) by mouth daily.   simethicone 80 MG chewable tablet Commonly known as:  MYLICON Chew 80 mg by mouth every 6 (six) hours as needed for flatulence.   sucralfate 1 g tablet Commonly known as:  CARAFATE Take 1 tablet (1 g total) by mouth 4 (four) times daily -  with meals and at bedtime.   topiramate 100 MG tablet Commonly known as:  TOPAMAX Take 1 tablet (100 mg total) by mouth at bedtime.   Vitamin B-12 1000 MCG/15ML Liqd Take 5 mLs by mouth daily.   XULANE 150-35 MCG/24HR transdermal patch Generic drug:  norelgestromin-ethinyl estradiol Place 2 patches onto the skin once a week.      Follow-up Information    [name], MD Follow up.   Specialty:  General Surgery Why:  keep january appt as scheduled Contact information: [place] [address] [phone]            The results of significant diagnostics from this hospitalization (including imaging, microbiology, ancillary and laboratory) are listed below for reference.    Significant Diagnostic Studies: Ct Abdomen Pelvis W Contrast  Result Date: 02/20/2016 CLINICAL DATA:  45 year old female with epigastric pain. 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 Initial encounter. EXAM: CT ABDOMEN AND PELVIS WITH CONTRAST TECHNIQUE: Multidetector CT imaging of the abdomen and pelvis was performed using the standard protocol following bolus administration of intravenous contrast. CONTRAST:  144mL ISOVUE-300 IOPAMIDOL (ISOVUE-300) INJECTION 61% COMPARISON:  CT Abdomen and Pelvis 01/30/2016 and earlier. FINDINGS: Lower chest: Negative.  No pericardial or pleural effusion. No upper abdominal free air. Hepatobiliary: Surgically absent gallbladder. Negative liver. No biliary ductal enlargement. Pancreas: Negative. Spleen: Negative. Adrenals/Urinary Tract: Negative adrenal glands.  Kidneys appears stable. Bilateral renal enhancement and contrast excretion is normal. Unremarkable urinary bladder. Stomach/Bowel: Gas distended rectum. Mild retained stool in the sigmoid colon. Oral contrast has reached the descending colon which is redundant. Negative transverse colon. Negative right colon. Diminutive or absent appendix. Postoperative changes to the stomach and small bowel with percutaneous gastrostomy tube in place today. This tube is in the distal/bypassed portion of the  stomach. Gastrojejunostomy changes and left abdominal small bowel anastomosis again demonstrated. Fluid-filled but nondilated small bowel in the left abdomen. The duodenum and ligament of Treitz are decompressed. No abdominal free air or free fluid. Vascular/Lymphatic: Major arterial structures are patent. Grossly patent portal venous system. No lymphadenopathy. Reproductive: Fibroid uterus appears stable. Other: No pelvic free fluid. Musculoskeletal: Stable visualized osseous structures. Lumbosacral junction disc and endplate degeneration. IMPRESSION: 1. No bowel obstruction, oral contrast has reached the descending colon. Gastrostomy tube again in place in the distal/bypassed portion of the stomach, which is decompressed along with the duodenum. Sequelae of Roux-en-Y with fluid-filled but nondilated left abdominal small bowel loops. 2. No acute or inflammatory process identified. Electronically Signed   By: Genevie Ann M.D.   On: 02/20/2016 14:56   Ct Abdomen Pelvis W Contrast  Result Date: 01/30/2016 CLINICAL DATA:  G-tube sticking out but painful EXAM: CT ABDOMEN AND PELVIS WITH CONTRAST TECHNIQUE: Multidetector CT imaging of the abdomen and pelvis was performed using the standard protocol following bolus administration of intravenous contrast. CONTRAST:  171mL ISOVUE-300 IOPAMIDOL (ISOVUE-300) INJECTION 61%, 46mL ISOVUE-300 IOPAMIDOL (ISOVUE-300) INJECTION 61% COMPARISON:  10/23/2015, 10/03/2015, 08/06/2015 FINDINGS: Lower  chest: Lung bases demonstrate no acute consolidation, effusion, or infiltrate. Hepatobiliary: Tiny sub cm hypodensity within the anterior aspect of the liver slightly more apparent on today's exam. Stable right posterior inferior hepatic lobe 1.2 cm hypodense lesion, suspect hemangioma as there appears to be peripheral filling in on delayed images. Mild hypodensities centrally within the liver could relate to mild periportal edema. Mildly prominent extrahepatic common bile duct is unchanged. Surgical clips in the gallbladder fossa consistent with prior cholecystectomy. Pancreas: Unremarkable. No pancreatic ductal dilatation or surrounding inflammatory changes. Spleen: Normal in size without focal abnormality. Adrenals/Urinary Tract: Adrenal glands are unremarkable. Kidneys are normal, without renal calculi, focal lesion, or hydronephrosis. Bladder is unremarkable. Stomach/Bowel: The stomach is collapsed. There is no gastrostomy tube present within the stomach. Linear soft tissue tract present within the left anterior abdominal wall. Some soft tissue thickening in this region could relate to mild inflammation or possibly mild infection. There is no evidence for subcutaneous abscess. No evidence for a bowel obstruction. Postsurgical changes of the stomach and bowel consistent with prior gastric bypass surgery. Vascular/Lymphatic: Not aneurysmal aorta. No significantly enlarged abdominal or pelvic lymph nodes. Reproductive: No adnexal masses. 4.1 cm masses in the right uterus, probable fibroid. Other: No free air or free fluid. Musculoskeletal: No acute osseous abnormality. Prominent SI joint sclerosis. IMPRESSION: 1. No gastrostomy tube present within the stomach. Linear gastrostomy tube tract is visualized within the left anterior abdominal wall. There is mild soft tissue stranding and thickening around the trach possibly related to inflammation. There is no evidence for a soft tissue abscess. 2. Status post gastric  bypass surgery without evidence for a bowel obstruction. 3. 4.1 cm uterine masses, probable fibroid Electronically Signed   By: Donavan Foil M.D.   On: 01/30/2016 15:59   Ir Replc Gastro/colonic Tube Percut W/fluoro  Result Date: 01/31/2016 INDICATION: 45 year old female with prior gastric bypass and a surgically placed gastrostomy tube. The tube has fallen out. EXAM: GASTROSTOMY CATHETER REPLACEMENT MEDICATIONS: None ANESTHESIA/SEDATION: Fentanyl 50 mcg IV None CONTRAST:  72mL ISOVUE-300 IOPAMIDOL (ISOVUE-300) INJECTION 61% - administered into the gastric lumen. FLUOROSCOPY TIME:  Fluoroscopy Time: 2 minutes 42 seconds (81.3 mGy). COMPLICATIONS: None PROCEDURE: Informed written consent was obtained from the patient after a thorough discussion of the procedural risks, benefits and alternatives. All questions were addressed. Maximal  Sterile Barrier Technique was utilized including caps, mask, sterile gowns, sterile gloves, sterile drape, hand hygiene and skin antiseptic. A timeout was performed prior to the initiation of the procedure. Viscous lidocaine jelly was used for local anesthesia. Once the patient was prepped and draped in the usual sterile fashion, contrast was infused through the indwelling Foley catheter to confirm location within the stomach lumen. Using modified Seldinger technique, the Foley catheter was exchanged over a wire for placement of a new 90 French balloon retention gastrostomy. 5 cc of saline was inflated within the balloon retention. Patient tolerated the procedure well and remained hemodynamically stable throughout. No complications were encountered and no significant blood loss. Note for future exchange, the distal catheter/ tube when easily beyond the surgical anastomosis/pylorus and needed to be retracted and repositioned into the stomach lumen. IMPRESSION: Status post rescue of gastrostomy tube, with placement of a new 103 French balloon retention gastrostomy tube. Signed, Dulcy Fanny. Earleen Newport, DO [facility] Radiology Electronically Signed   By: Corrie Mckusick D.O.   On: 01/31/2016 12:52   Dg Abdomen Peg Tube Location  Result Date: 01/30/2016 CLINICAL DATA:  PEG tube placement.  Initial encounter. EXAM: ABDOMEN - 1 VIEW COMPARISON:  CT of the abdomen and pelvis performed earlier today at 3:35 p.m. FINDINGS: The patient's G-tube balloon is noted at the body of the stomach, with contrast filling the stomach. The visualized bowel gas pattern is unremarkable. Scattered air and stool filled loops of colon are seen; no abnormal dilatation of small bowel loops is seen to suggest small bowel obstruction. No free intra-abdominal air is identified, though evaluation for free air is limited on a single supine view. The visualized osseous structures are within normal limits; the sacroiliac joints are unremarkable in appearance. Contrast is noted filling the bladder. Clips are noted within the right upper quadrant, reflecting prior cholecystectomy. IMPRESSION: 1. G-tube balloon noted at the body of the stomach, with contrast filling the stomach. 2. Unremarkable bowel gas pattern; no free intra-abdominal air seen. Moderate amount of stool noted in the colon. Electronically Signed   By: Garald Balding M.D.   On: 01/30/2016 19:12   Dg Duanne Limerick W/water Sol Cm  Result Date: 02/19/2016 CLINICAL DATA:  Prior roux-en-y gastrojejunostomy. Gastrostomy tube in place. We are asked to assess for abnormal leak or connection or other specific cause for the patient's ongoing epigastric pain and discomfort, including abnormal early filling of the blind end. EXAM: WATER SOLUBLE UPPER GI SERIES TECHNIQUE: Single-column upper GI series was performed using water soluble contrast. CONTRAST:  50 cc of water-soluble contrast medium was administered orally (not through the G-tube) COMPARISON:  None. FLUOROSCOPY TIME:  Fluoroscopy Time:  2 minutes, 48 seconds Radiation Exposure  Index (if provided by the fluoroscopic device): 45.5 mGy Number of Acquired Spot Images: 1 FINDINGS: Initial KUB demonstrates a gastrostomy tube in place. Unremarkable bowel gas pattern. Postoperative findings in the left upper quadrant, and clips in the right upper quadrant likely from cholecystectomy. Sclerosis along the iliac sides of the sacroiliac joints. In series 3 we demonstrate initial filling of the small gastric pouch with prompt contrast extension into the jejunum through a patent gastrojejunostomy. I do not see a persistent contrast collection in this vicinity to suggest a marginal ulcer. In series 4i had the patient turn slowly through bout 150 degrees to image the gastric pouch an jejunum from different angles. I do not see a dilated proximal "candy-cane" blind-ending segment to suggest candy-cane syndrome.  The initially filling loop of jejunum is mildly dilated, and there is brisk contrast extension inferiorly in the jejunum further characterized on later series. I allowed contrast to fill down to the expected vicinity of the distal anastomosis although reflux through the distal anastomosis was not achieved. The gastrostomy tube is not visualized in the gastric pouch and based on prior CT and tube replacement exams I believe that the tube is in the bypassed portion of the stomach. IMPRESSION: 1. Today's exam does not reveal marginal ulcers, leak, or a dilated short candy-cane segment of jejunum proximal to the gastrojejunostomy to suggest candy-cane syndrome. A specific cause for the patient's persistent abdominal pain is not certain, no specific imaging abnormality was observed. Electronically Signed   By: Van Clines M.D.   On: 02/19/2016 16:29    Microbiology: No results found for this or any previous visit (from the past 240 hour(s)).   Labs: Basic Metabolic Panel:  Recent Labs Lab 02/19/16 1434 02/20/16 0440 02/21/16 0521 02/22/16 0437  NA 139 141 136 138  K 3.4* 3.8 3.4*  3.3*  CL 107 111 107 107  CO2 25 25 24 24   GLUCOSE 85 82 99 104*  BUN 16 16 8 7   CREATININE 1.14* 1.09* 0.99 1.00  CALCIUM 8.6* 8.5* 8.4* 8.3*  MG 2.1  --   --  2.0  PHOS 3.1  --   --  3.3   Liver Function Tests:  Recent Labs Lab 02/19/16 1434  AST 15  ALT 12*  ALKPHOS 60  BILITOT 0.7  PROT 6.9  ALBUMIN 3.6   No results for input(s): LIPASE, AMYLASE in the last 168 hours. No results for input(s): AMMONIA in the last 168 hours. CBC:  Recent Labs Lab 02/19/16 1434  WBC 10.6*  NEUTROABS 7.9*  HGB 12.1  HCT 36.7  MCV 92.7  PLT 356   Cardiac Enzymes: No results for input(s): CKTOTAL, CKMB, CKMBINDEX, TROPONINI in the last 168 hours. BNP: BNP (last 3 results) No results for input(s): BNP in the last 8760 hours.  ProBNP (last 3 results) No results for input(s): PROBNP in the last 8760 hours.  CBG:  Recent Labs Lab 02/23/16 1601 02/23/16 2015 02/24/16 0021 02/24/16 0433 02/24/16 0752  GLUCAP 75 95 88 91 96    Active Problems:   IIH (idiopathic intracranial hypertension)   Mild obstructive sleep apnea   S/P gastric bypass   Protein-calorie malnutrition, moderate (HCC)   Epigastric pain   Hypokalemia   Constipation, chronic   Chronic nausea   Time coordinating discharge: 20 min  Signed:  [name], MD [facility] Surgery, [place] [phone] 02/24/2016, 11:54 AM

## 2016-02-24 NOTE — Progress Notes (Signed)
Pt expresses her desire to go home today. Pt stated that she has tube feeding equipment at home, she would however like some of her prescriptions renewed. [place]

## 2016-02-24 NOTE — Discharge Instructions (Signed)

## 2016-02-24 NOTE — Progress Notes (Signed)
Notified [facility] of [place] orders and pending d/c for today.

## 2016-02-24 NOTE — Progress Notes (Signed)
Pt was discharged home today. Instructions were reviewed with patient, prescription was given and questions were answered. Pt was taken to main entrance via wheelchair by NT.  

## 2016-02-25 LAB — VITAMIN B1: Vitamin B1 (Thiamine): 80.5 nmol/L (ref 66.5–200.0)

## 2016-02-26 ENCOUNTER — Telehealth: Payer: Self-pay | Admitting: Family Medicine

## 2016-02-26 ENCOUNTER — Other Ambulatory Visit: Payer: Self-pay | Admitting: *Deleted

## 2016-02-26 LAB — VITAMIN A: Vitamin A (Retinoic Acid): 27 ug/dL (ref 20–65)

## 2016-02-26 NOTE — Telephone Encounter (Signed)
Pt came into our office wanting a RX for Glucose meter she was just released from hospital and the Doctor there didn't know how to order a meter  and he told her to go to provider to get a RX for it  Dr Carlota Raspberry gave me a meter kit that was in the provider office it came with lancets but no strips she states that she need strips and lancets sent to the [facility] pharmacy  Please call pt if you have questions at [phone]

## 2016-02-26 NOTE — Patient Outreach (Signed)
[place] [facility]) Care Management  02/26/2016  Margaret Fletcher [birth year] [id number]   Subjective: Telephone call to patient's home / mobile number, spoke with patient, and HIPAA verified.   Discussed [facility] Care Management UMR Transition of care follow up, Link to Wellness, and [employer] program for prediabetes.   Patient voices understanding and is in agreement to complete follow up.  States she remembers this RNCM from past transition of care follow up calls. Patient states she is doing good and spending some time at her parents home out of town for the holidays.  States she has follow up appointment with surgeon on 03/26/16 and she will call primary MD to schedule appointment.   Patient states she suppose to have home health, agency called to schedule visit, and she declined the visit since she was going out of town.  States she has had several customer service issue and is dissatisfied with the home health agency ([facility]).   RNCM educated on how to address her concerns, patient voiced understanding, and states she will call agency to do start of care when she returns to her home.   Patient had questions regarding calcium and multivitamins, and will follow up with [facility] outpatient pharmacist to discuss her questions.    Patient had a questions regarding monitoring blood glucose with glucometer and [employer] employee benefits for prediabetes.  States her blood glucose level was low during hospitalization.   She will follow up with surgeon's office on 02/26/16 to verify if surgeon would like for her to obtain glucometer for daily blood sugar testing.   RNCM will follow up with Link to Wellness RNCM regarding prediabetes program enrollment, resources,  and verify process for patient to obtain free glucometer or at low cost.  Patient in agreement to receive follow up call to address questions.  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  Patient continues to have nightly tube feedings and full liquid diet during the day.  States she is tolerating her diet and follows treatment regiment if she is not ingesting adequate calorie intake per day.   Case discussed with Link to Wellness RNCM's and received verbal update on available [employer] Employee prediabetes resources through [employer] to [employer] and [employer].    Objective: Per chart review: Patient hospitalized 02/19/16 - 02/24/16 for epigastric pain.  Patient has a history of Prediabetes, status post gastric bypass (hospitalized 07/08/15 - 07/10/15), autoimmune disorder, total parenteral nutrition (TPN), alopecia,depression, and Protein-calorie malnutrition. Her postop course was complicated by being readmitted on May 8 through the 12 for difficulty with oral intake. Received IV fluids 3 timesper week via PICC line. Readmitted 08/16/15 - 08/19/15 with epigastric pain, sent home with TPN. Treated in ED 08/21/15 hyperkalemia. Has continued to have increased headaches. Readmitted 09/11/15 -09/14/15 bacteremia with PICC removal. Readmitted 09/30/15 -10/10/15 epigastric pain, moderate protein malnutrition, s/p revision Roux limb, G-tube placement on 09/30/15.  Last transition of care completed on 11/08/15.    Assessment: Referral UMR Transition of care referral on 02/24/16.   Transition of care follow up completed and patient requested 1 additional follow up call to verify answers to her questions.   Plan: RNCM will call patient for transition of care follow up to verify status of glucometer order, advise patient of  [employer] Employee prediabetes benefits that she could access if needed,  within 10 business days.   Rosy Estabrook H. Burt Knack  RN, BSN, [place] Telephonic CM Phone: [phone] Fax: [phone]

## 2016-02-27 ENCOUNTER — Other Ambulatory Visit: Payer: Self-pay | Admitting: *Deleted

## 2016-02-27 ENCOUNTER — Encounter: Payer: Self-pay | Admitting: *Deleted

## 2016-02-27 DIAGNOSIS — Z0271 Encounter for disability determination: Secondary | ICD-10-CM

## 2016-02-27 MED FILL — oxyCODONE HCL 5 MG/5ML SOLN: 5 | 6 days supply | Qty: 60 | Fill #0

## 2016-02-27 NOTE — Patient Outreach (Addendum)
[place] [facility]) Care Management  02/27/2016  Margaret Fletcher 17-Apr-1970 [id number]  Subjective: Telephone call to patient's home / mobile number, spoke with patient, and HIPAA verified.   States she spoke with surgeon (Dr. Redmond Pulling) and he does want her to monitor blood sugars.  States she has obtained the glucometer from primary MD's office and is in the process of obtaining strips.  RNCM advised of conversation with [place] Management Link to Wellness RNCM's  and patient is eligible for prediabetes program through Link to Wellness .    Patient is appreciative of the information and states she will call to set up appointment for enrollment in the program when she returns from vacation.    Patient states she does not have any transition of care, care coordination,  transportation, community resource, or pharmacy needs at this time.  Patient in agreement to receive [facility] Care Management information.   Objective: Per chart review: Patient hospitalized 02/19/16 - 02/24/16 for epigastric pain.  Patient has a history of Prediabetes, status post gastric bypass (hospitalized 07/08/15 - 07/10/15), autoimmune disorder, total parenteral nutrition (TPN), alopecia,depression, and Protein-calorie malnutrition. Her postop course was complicated by being readmitted on May 8 through the 12 for difficulty with oral intake. Received IV fluids 3 timesper week via PICC line. Readmitted 08/16/15 - 08/19/15 with epigastric pain, sent home with TPN. Treated in ED 08/21/15 hyperkalemia. Has continued to have increased headaches. Readmitted 09/11/15 -09/14/15 bacteremia with PICC removal. Readmitted 09/30/15 -10/10/15 epigastric pain, moderate protein malnutrition, s/p revision Roux limb, G-tube placement on 09/30/15.  Last transition of care completed on 11/08/15.    Assessment: Referral UMR Transition of care referral on 02/24/16.   Transition of care follow up completed, no further telephonic RNCM needs at this time and  will proceed with case closure.  Plan: RNCM will send patient successful outreach letter, [facility] pamphlet, and magnet. RNCM will send case closure due to follow up completed request to [name] [place] [facility].     Katherine Tout H. Annia Friendly, BSN, [place] Management [facility] Telephonic CM Phone: [phone] Fax: [phone]

## 2016-03-04 DIAGNOSIS — N62 Hypertrophy of breast: Secondary | ICD-10-CM | POA: Diagnosis not present

## 2016-03-06 MED FILL — KENALOG-40 40 MG/ML VIAL: 40 | 1 days supply | Qty: 5 | Fill #0

## 2016-03-06 MED FILL — XULANE PATCH: 150-35 | 28 days supply | Qty: 3 | Fill #1

## 2016-03-12 DIAGNOSIS — E43 Unspecified severe protein-calorie malnutrition: Secondary | ICD-10-CM | POA: Diagnosis not present

## 2016-03-12 DIAGNOSIS — E441 Mild protein-calorie malnutrition: Secondary | ICD-10-CM | POA: Diagnosis not present

## 2016-03-16 ENCOUNTER — Other Ambulatory Visit: Payer: Self-pay | Admitting: Obstetrics & Gynecology

## 2016-03-16 ENCOUNTER — Encounter: Payer: Self-pay | Admitting: Family Medicine

## 2016-03-16 ENCOUNTER — Encounter: Payer: 59 | Attending: General Surgery | Admitting: Dietician

## 2016-03-16 ENCOUNTER — Ambulatory Visit (INDEPENDENT_AMBULATORY_CARE_PROVIDER_SITE_OTHER): Payer: 59 | Admitting: Family Medicine

## 2016-03-16 VITALS — BP 125/79 | HR 68 | Temp 98.8°F | Resp 16 | Ht 68.0 in | Wt 189.0 lb

## 2016-03-16 DIAGNOSIS — Z713 Dietary counseling and surveillance: Secondary | ICD-10-CM | POA: Diagnosis not present

## 2016-03-16 DIAGNOSIS — R300 Dysuria: Secondary | ICD-10-CM | POA: Diagnosis not present

## 2016-03-16 DIAGNOSIS — Z931 Gastrostomy status: Secondary | ICD-10-CM

## 2016-03-16 DIAGNOSIS — Z1231 Encounter for screening mammogram for malignant neoplasm of breast: Secondary | ICD-10-CM

## 2016-03-16 DIAGNOSIS — R7303 Prediabetes: Secondary | ICD-10-CM | POA: Diagnosis not present

## 2016-03-16 DIAGNOSIS — F4323 Adjustment disorder with mixed anxiety and depressed mood: Secondary | ICD-10-CM | POA: Diagnosis not present

## 2016-03-16 DIAGNOSIS — G4489 Other headache syndrome: Secondary | ICD-10-CM

## 2016-03-16 DIAGNOSIS — Z9884 Bariatric surgery status: Secondary | ICD-10-CM

## 2016-03-16 DIAGNOSIS — E876 Hypokalemia: Secondary | ICD-10-CM | POA: Diagnosis not present

## 2016-03-16 LAB — POCT URINALYSIS DIP (MANUAL ENTRY)
Blood, UA: NEGATIVE
Glucose, UA: NEGATIVE
Leukocytes, UA: NEGATIVE
Nitrite, UA: NEGATIVE
PH UA: 5.5
SPEC GRAV UA: 1.025
Urobilinogen, UA: 0.2

## 2016-03-16 MED ORDER — ALPRAZOLAM 0.5 MG PO TABS: 0.5000 mg | ORAL_TABLET | Freq: Two times a day (BID) | ORAL | 1 refills | Status: DC | PRN

## 2016-03-16 MED ORDER — SERTRALINE HCL 25 MG PO TABS: 25.0000 mg | ORAL_TABLET | Freq: Every day | ORAL | 2 refills | Status: DC

## 2016-03-16 NOTE — Patient Instructions (Addendum)
-  Add moisture to meats (mayonnaise, mustard, yogurt ranch)  -Chew thoroughly, eat slowly, and take tiny bites -Experiment with some different temperatures -Control your environment (create a pleasant, simple atmosphere for trying new foods) -Continue to try to experiment with foods -Increase intake as tolerated -Try 1 can + water during the night   Surgery date: 07/08/2015 Surgery type: RYGB Start weight at [facility]: 278 lbs on 05/24/2014 Weight today: 190.6 lbs Weight change: 10 lbs Total weight loss: 87.4 lbs  TANITA  BODY COMP RESULTS  07/01/15 07/23/15 10/31/15 02/07/16 03/16/16   BMI (kg/m^2) 40.4 38.6 33.4 30.4 29   Fat Mass (lbs) 138.5 131.0 103.2 84 80   Fat Free Mass (lbs) 127.5 122.8 116.4 116.2 110.6   Total Body Water (lbs) 93.5 90.4 84.4 83.4 79.2

## 2016-03-16 NOTE — Patient Instructions (Signed)
     IF you received an x-ray today, you will receive an invoice from [facility]. Please contact [facility] at [phone] with questions or concerns regarding your invoice.   IF you received labwork today, you will receive an invoice from [facility]. Please contact [facility] at [phone] with questions or concerns regarding your invoice.   Our billing staff will not be able to assist you with questions regarding bills from these companies.  You will be contacted with the lab results as soon as they are available. The fastest way to get your results is to activate your My Chart account. Instructions are located on the last page of this paperwork. If you have not heard from us regarding the results in 2 weeks, please contact this office.     

## 2016-03-16 NOTE — Progress Notes (Signed)
  Follow-up visit: 8 months Post-Operative RYGB Surgery  Medical Nutrition Therapy:  Appt start time: 835 end time:  73  Primary concerns today: Post-operative Bariatric Surgery Nutrition Management.  Margaret Fletcher returns for a bariatric nutrition follow up 8 months post op RYGB. She reports she has been using her pump for continuous feeds throughout the night (1 can). She reports tolerating feeds overnight but this causes her to be unable to eat much during the day. Sips during the day (about 24 oz total) water, tea, or milk. May eat part of a scrambled egg and livermush for breakfast. Sometimes "nibbles here and there" on meat. Still struggling with taste "distortion". Continues to try new foods like nuts, fruit, and meats. [name] states she is now getting to the point where she prefers an empty stomach because it does not hurt. Feels like Carafate and Roxicodone prior to eating helps, "I have a 4-6 hour window." Using Linzess and Miralax to help with constipation. Continues to take vitamins via PEG tube. She reports that her headaches are improving. Feels like taste changes have less to do with surgery and more to do with intracranial HTN.   Surgery date: 07/08/2015 Surgery type: RYGB Start weight at [facility]: 278 lbs on 05/24/2014 Weight today: 190.6 lbs Weight change: 10 lbs Total weight loss: 87.4 lbs  TANITA  BODY COMP RESULTS  07/01/15 07/23/15 10/31/15 02/07/16 03/16/16   BMI (kg/m^2) 40.4 38.6 33.4 30.4 29   Fat Mass (lbs) 138.5 131.0 103.2 84 80   Fat Free Mass (lbs) 127.5 122.8 116.4 116.2 110.6   Total Body Water (lbs) 93.5 90.4 84.4 83.4 79.2    Preferred Learning Style:   No preference indicated   Learning Readiness:   Ready   Fluid intake: 24 oz water and tea + 1 8oz can enteral formula  Estimated total protein intake: unable to determine  1 can (8 oz) Vital High protein enteral formula provides 237 calories, 20.7 grams protein, 5 grams fat, 26.7 grams carbohydrate, 7 oz  water. Patient flushes tube with "small amounts of water."  Medications: see list Supplementation: liquid multivitamin [name]), liquid Calcium + vitamin D, B12  Drinking while eating: yes Hair loss: unknown, patient has alopecia Carbonated beverages: tried a Coke for nausea but did not like it N/V/D/C: nausea and constipation Dumping syndrome: none  Recent physical activity:  Did not assess  Progress Towards Goal(s):  In progress.  Handouts given during visit include:  none   Nutritional Diagnosis:  NI-2.1 Inadequate intake As related to food intolerance, nausea, and taste aversions.  As evidenced by patient requiring enteral nutrition support to meet nutrient needs.    Intervention:  Nutrition counseling provided. Praised patient on efforts to continue to increase po intake and meet nutrition needs. Discussed overnight enteral feeds via PEG tube to meet fluid and macronutrient needs.  Teaching Method Utilized:  Visual Auditory Hands on  Barriers to learning/adherence to lifestyle change: nausea and inability to meet protein needs orally  Demonstrated degree of understanding via:  Teach Back   Monitoring/Evaluation:  Dietary intake, exercise, and body weight. Follow up in 4 weeks.

## 2016-03-16 NOTE — Progress Notes (Signed)
Subjective:    Patient ID: Margaret Fletcher, female    DOB: Dec 28, 1970, 46 y.o.   MRN: [id number] Chief Complaint  Patient presents with  . Follow-up    hospital  . burning urination    x 2wks  . Medication Refill    xanax    HPI  Margaret Fletcher is a 46 yo woman who is here on a hospital follow-up during which she was hospitalized from 02/19/16-02/24/16 for:  1.   IIH (idiopathic intracranial hypertension) - (due to empty sella syndrome?) 2.   Mild obstructive sleep apnea 3.   S/P gastric bypass 4.   Protein-calorie malnutrition, moderate (HCC) 5.   Epigastric pain 6.   Hypokalemia 7.   Constipation, chronic 8.   Chronic nausea  Has a h/o an autoimmune d/o?  At discharge she was arranged to home health for tube feeds at night. Pt underwent gastric bypass surgery with Roux-en-Y gastric bypass May 2017 &followed by diagnostic laparoscopy with resection of her afferent limb of her Roux limb (candycane limb) and placement of gastrostomy tube in her gastric remnant for upper abdominal pain and discomfort in july. Her G-tube fell out a few days after surgery and had to be placed by interventional radiology which again occurred during a follow-up bariatric OV in Nov and she was then started on trophic G-tube feeds at night (1/2 can) to help meet nutritional goals as was mainly drinking thick liquid soup during day. On Dec 11 she developed epigastric abd pain and nausea with tenderness around the G-tube.  She was started on antibiotics and had an upper endoscopy and CT scan without sx etiology found.  Feeds were resumed and she was started on linzess for chronic constipation which seemed to be working well. Cont ppi and carafate. Fo/u with surg as an outpatient. Margaret Fletcher is my primary patient but I have only met her once prior 8 mos ago for an acute URI. Pt has worked as a Chartered certified accountant in the ED  She has been set up for a pre-diabetes program on advise of her surgeon and is monitoring her blood  pressurea t home.  Has been approved for CABG Was following with Dr. Katy Fitch and noted widening opthalmic nerves requring topamax which helped HAs.  She has not had labs since her hosp.  Her PO intake is very little. She can only drink 1 bottle of water all day long as it causes bloating and fullness sensation. topmax has taken away her since of smell or made her only have a rotten onion smell. She cannot use diamox due to a sulfa allergy. Looking at a lot of food makes her nauseas  Anxiety is getting worse. Takes xanax truly as needed and can go days or weeks between needing them.  She has some nausea, shortness of breath, restless, hard to focus sometimes which represent a panic attack.  She has an irregular sleep schedule and sometimes wakes after sleeping for just sev hrs and some nights are totally fine.  She is not back at work so is taking classes instead so she can distract herself with something else - she is currently doing the gen ed and is interested in nursing or PT  She has been on wellbutrin for a long time for depression and never tried anything else. She did go off this for a while.  Dr. Marin Comment started it back over a year ago prior to pt's surgery.  Her depression is intermittent.  A friend stays  with her. She can't bend, lift. Does not have a home health nurse come out anymore.   Alprazolam 0.5#15 05/2015. #30 04/17, 11/15/15  During her last hospitalization she learned that her cbgs dropped overnight. She has not had any hypoglycemic episodes.  No lower than 70s.  Past Medical History:  Diagnosis Date  . Alopecia   . Anxiety   . Autoimmune disease ([place])    "Alopecia"  . Bacterial vaginosis   . Cyst of spleen   . Depression   . Depression   . Frequent UTI   . Hypertrophy of breast   . IIH (idiopathic intracranial hypertension) 2017   "related to headaches"  . Migraine   . Migraine headache    denies, ruled out - Propranolol.Using for headaches   Past Surgical History:    Procedure Laterality Date  . APPENDECTOMY    . BREAST REDUCTION SURGERY Bilateral 03/26/2015   Procedure: BILATERAL BREAST REDUCTION  ;  Surgeon: Youlanda Roys, MD;  Location: [facility];  Service: Plastics;  Laterality: Bilateral;  . BREATH TEK H PYLORI N/A 05/21/2014   Procedure: BREATH TEK H PYLORI;  Surgeon: Greer Pickerel, MD;  Location: [facility];  Service: General;  Laterality: N/A;  . CESAREAN SECTION    . CESAREAN SECTION    . CHOLECYSTECTOMY    . DILITATION & CURRETTAGE/HYSTROSCOPY WITH NOVASURE ABLATION N/A 07/19/2012   Procedure: HYSTEROSCOPY WITH NOVASURE ABLATION;  Surgeon: Farrel Gobble. Harrington Challenger, MD;  Location: [facility];  Service: Gynecology;  Laterality: N/A;  . ENDOMETRIAL ABLATION    . ESOPHAGOGASTRODUODENOSCOPY N/A 08/16/2015   Procedure: UPPER ESOPHAGOGASTRODUODENOSCOPY (EGD);  Surgeon: Greer Pickerel, MD;  Location: [facility];  Service: General;  Laterality: N/A;  . ESOPHAGOGASTRODUODENOSCOPY N/A 02/21/2016   Procedure: ESOPHAGOGASTRODUODENOSCOPY (EGD);  Surgeon: Greer Pickerel, MD;  Location: [facility];  Service: General;  Laterality: N/A;  . IR GENERIC HISTORICAL  10/04/2015   IR [place] Vivianne Master 10/04/2015 Markus Daft, MD [facility]  . IR GENERIC HISTORICAL  01/31/2016   IR [place] GASTRO/COLONIC TUBE PERCUT W/FLUORO 01/31/2016 Corrie Mckusick, DO [facility]  . LAPAROSCOPIC GASTROSTOMY N/A 09/30/2015   Procedure: LAPAROSCOPIC GASTROSTOMY TUBE PLACEMENT;  Surgeon: Greer Pickerel, MD;  Location: [facility];  Service: General;  Laterality: N/A;  . LAPAROSCOPIC ROUX-EN-Y GASTRIC BYPASS WITH HIATAL HERNIA REPAIR N/A 07/08/2015   Procedure: LAPAROSCOPIC ROUX-EN-Y GASTRIC BYPASS WITH HIATAL HERNIA REPAIR WITH UPPER ENDOSCOPY;  Surgeon: Greer Pickerel, MD;  Location: [facility];  Service: General;  Laterality: N/A;  . LAPAROSCOPIC SMALL BOWEL RESECTION N/A 09/30/2015   Procedure: LAPAROSCOPIC REVISION OF ROUX LIMB;  Surgeon: Greer Pickerel, MD;  Location: [facility];   Service: General;  Laterality: N/A;  . LAPAROSCOPY N/A 09/30/2015   Procedure: LAPAROSCOPY DIAGNOSTIC;  Surgeon: Greer Pickerel, MD;  Location: [facility];  Service: General;  Laterality: N/A;  . TUBAL LIGATION    . WISDOM TOOTH EXTRACTION     Current Outpatient Prescriptions on File Prior to Visit  Medication Sig Dispense Refill  . CALCIUM-VITAMIN D PO Take 15 mLs by mouth 3 (three) times daily.    . Cyanocobalamin (VITAMIN B-12) 1000 MCG/15ML LIQD Take 5 mLs by mouth daily.    Marland Kitchen linaclotide (LINZESS) 145 MCG CAPS capsule Take 1 capsule (145 mcg total) by mouth daily before breakfast. 30 capsule 0  . Multiple Vitamin (MULTIVITAMIN) LIQD Take 5 mLs by mouth 2 (two) times daily.    . Nutritional Supplements (FEEDING SUPPLEMENT, VITAL HIGH PROTEIN,) LIQD liquid Place 1,000 mLs into  feeding tube daily. 32m/hr for 8hrs a day as needed for poor oral intake; 300 ml free water q4hr 1000 mL 8  . ondansetron (ZOFRAN-ODT) 4 MG disintegrating tablet Take 1 tablet (4 mg total) by mouth every 6 (six) hours as needed for nausea. 30 tablet 0  . oxyCODONE (ROXICODONE) 5 MG/5ML solution Take 2.5 mLs (2.5 mg total) by mouth every 6 (six) hours as needed for moderate pain or severe pain. 60 mL 0  . pantoprazole (PROTONIX) 40 MG tablet Take 1 tablet (40 mg total) by mouth daily. 30 tablet 1  . simethicone (MYLICON) 80 MG chewable tablet Chew 80 mg by mouth every 6 (six) hours as needed for flatulence.    . sucralfate (CARAFATE) 1 g tablet Take 1 tablet (1 g total) by mouth 4 (four) times daily -  with meals and at bedtime. 90 tablet 1  . topiramate (TOPAMAX) 100 MG tablet Take 1 tablet (100 mg total) by mouth at bedtime. 30 tablet 11  . XULANE 150-35 MCG/24HR transdermal patch Place 2 patches onto the skin once a week.  11   No current facility-administered medications on file prior to visit.    Allergies  Allergen Reactions  . Sulfa Antibiotics Anaphylaxis  . Zosyn [Piperacillin Sod-Tazobactam So] Rash   Family  History  Problem Relation Age of Onset  . Cancer Mother   . Stroke Father   . Diabetes Brother   . Seizures Son   . Migraines Neg Hx    Social History   Social History  . Marital status: Divorced    Spouse name: N/A  . Number of children: 2  . Years of education: 16   Occupational History  . [employer]- nurse tech    Social History Main Topics  . Smoking status: Never Smoker  . Smokeless tobacco: Never Used  . Alcohol use Yes     Comment: socially  . Drug use: No  . Sexual activity: Yes    Birth control/ protection: Surgical   Other Topics Concern  . None   Social History Narrative   Lives with partner   Caffeine use: minimal coffee   [facility] 03/16/2016 02/07/2016 01/21/2016 11/15/2015 10/31/2015  Decreased Interest 0 0 0 0 0  Down, Depressed, Hopeless 0 0 0 0 0  PHQ - 2 Score 0 0 0 0 0     Review of Systems  Constitutional: Positive for activity change, appetite change and fatigue. Negative for fever.  Gastrointestinal: Positive for abdominal distention, abdominal pain and nausea.  Allergic/Immunologic: Positive for immunocompromised state.  Neurological: Positive for weakness and headaches. Negative for syncope.  Psychiatric/Behavioral: Positive for dysphoric mood and sleep disturbance. Negative for agitation, behavioral problems, confusion, decreased concentration, hallucinations and self-injury. The patient is nervous/anxious. The patient is not hyperactive.        Objective:   Physical Exam  Constitutional: She is oriented to person, place, and time. She appears well-developed and well-nourished. No distress.  HENT:  Head: Normocephalic and atraumatic.  Right Ear: External ear normal.  Left Ear: External ear normal.  Eyes: Conjunctivae are normal. No scleral icterus.  Neck: Normal range of motion. Neck supple. No thyromegaly present.  Cardiovascular: Normal rate, regular rhythm, normal heart sounds and intact distal pulses.     Pulmonary/Chest: Effort normal and breath sounds normal. No respiratory distress.  Musculoskeletal: She exhibits no edema.  Lymphadenopathy:    She has no cervical adenopathy.  Neurological: She is alert and oriented to person, place,  and time.  Skin: Skin is warm and dry. She is not diaphoretic. No erythema.  Psychiatric: Her speech is normal and behavior is normal. Cognition and memory are normal. She exhibits a depressed mood.   BP 125/79   Pulse 68   Temp 98.8 F (37.1 C) (Oral)   Resp 16   Ht 5' 8" (1.727 m)   Wt 189 lb (85.7 kg)   SpO2 100%   BMI 28.74 kg/m      Results for orders placed or performed in visit on 03/16/16  POCT urinalysis dipstick  Result Value Ref Range   Color, UA yellow yellow   Clarity, UA clear clear   Glucose, UA negative negative   Bilirubin, UA small (A) negative   Ketones, POC UA trace (5) (A) negative   Spec Grav, UA 1.025    Blood, UA negative negative   pH, UA 5.5    Protein Ur, POC =30 (A) negative   Urobilinogen, UA 0.2    Nitrite, UA Negative Negative   Leukocytes, UA Negative Negative    Assessment & Plan:    1. Burning with urination - ua shows dehydration which could be cause, clx P, recheck at f/u with new proteinuria  2. Hypokalemia - If she ever needs K, she needs to do liquid for her tube - cant swallow it.  3. S/P gastric bypass - following closely with [facility] still due to prolonged complications - unknown why pt is having such severe absorption inabilities.  4. G tube feedings (HCC)  - does not have home health - was causing a lot of anxiety from the administration aspect  5. Prediabetes   6. Other headache syndrome   7. Adjustment disorder with mixed anxiety and depressed mood - has never been on any other antidepressant than wellbutrin but I am wondering if this is exacerbating her anxiety and poor sleep so rec stopping and start sertraline 25.  If no sig improvement can increase to 50. If side effects or worsening can  switch back to wellbutrin. If she restarts wellbutrin, may want to consider using IR rather than XR to see if improve absorption helps.  Could also consider change to SL version of xanax due to trouble with GI absorption prn. Uses alprazolam very sparingly - only #75 pills in the past year.  Encouraging that pt has resumed her education to help give her days some structure   Is fine w/ either 30d or 90d refills on meds. F/u in 3-4 mos to review chronic medical conditions and mood d/o.   Orders Placed This Encounter  Procedures  . Urine culture  . Comprehensive metabolic panel  . Hemoglobin A1c  . Care order/instruction:    AVS printed - let patient go! After labs are drawn. Please have her sched a f/u OV in 3 mos for review of chronic medical probl  . POCT urinalysis dipstick    Meds ordered this encounter  Medications  . ALPRAZolam (XANAX) 0.5 MG tablet    Sig: Take 1 tablet (0.5 mg total) by mouth 2 (two) times daily as needed for anxiety.    Dispense:  30 tablet    Refill:  1  . sertraline (ZOLOFT) 25 MG tablet    Sig: Take 1 tablet (25 mg total) by mouth daily.    Dispense:  30 tablet    Refill:  2    Eva Shaw, M.D.  [facility]  [place] [address] [place], [place] [address] [phone]   [phone] phone [phone] fax  03/16/16 10:42 PM  

## 2016-03-17 ENCOUNTER — Encounter: Payer: Self-pay | Admitting: Dietician

## 2016-03-17 LAB — COMPREHENSIVE METABOLIC PANEL
A/G RATIO: 1.2 (ref 1.2–2.2)
ALBUMIN: 3.8 g/dL (ref 3.5–5.5)
ALK PHOS: 62 IU/L (ref 39–117)
ALT: 18 IU/L (ref 0–32)
AST: 15 IU/L (ref 0–40)
BUN / CREAT RATIO: 15 (ref 9–23)
BUN: 17 mg/dL (ref 6–24)
CHLORIDE: 102 mmol/L (ref 96–106)
CO2: 25 mmol/L (ref 18–29)
Calcium: 8.9 mg/dL (ref 8.7–10.2)
Creatinine, Ser: 1.12 mg/dL — ABNORMAL HIGH (ref 0.57–1.00)
GFR calc non Af Amer: 59 mL/min/{1.73_m2} — ABNORMAL LOW (ref >59)
GFR, EST AFRICAN AMERICAN: 68 mL/min/{1.73_m2} (ref >59)
GLUCOSE: 80 mg/dL (ref 65–99)
Globulin, Total: 3.1 g/dL (ref 1.5–4.5)
POTASSIUM: 4.2 mmol/L (ref 3.5–5.2)
Sodium: 141 mmol/L (ref 134–144)
Total Protein: 6.9 g/dL (ref 6.0–8.5)

## 2016-03-17 LAB — HEMOGLOBIN A1C
Est. average glucose Bld gHb Est-mCnc: 105 mg/dL
HEMOGLOBIN A1C: 5.3 % (ref 4.8–5.6)

## 2016-03-17 MED FILL — SERTRALINE HCL 25 MG TABLET: 25 | 30 days supply | Qty: 30 | Fill #0

## 2016-03-18 LAB — URINE CULTURE: Organism ID, Bacteria: NO GROWTH

## 2016-03-19 ENCOUNTER — Other Ambulatory Visit: Payer: Self-pay | Admitting: Plastic Surgery

## 2016-03-19 DIAGNOSIS — N63 Unspecified lump in unspecified breast: Secondary | ICD-10-CM

## 2016-03-19 MED FILL — ALPRAZolam 0.5 MG TABS: 0.5 | 15 days supply | Qty: 30 | Fill #0

## 2016-03-27 ENCOUNTER — Ambulatory Visit
Admission: RE | Admit: 2016-03-27 | Discharge: 2016-03-27 | Disposition: A | Discharge status: Home / Self Care | Payer: 59 | Source: Ambulatory Visit | Attending: Plastic Surgery | Admitting: Plastic Surgery

## 2016-03-27 DIAGNOSIS — N63 Unspecified lump in unspecified breast: Secondary | ICD-10-CM

## 2016-03-27 DIAGNOSIS — N6011 Diffuse cystic mastopathy of right breast: Secondary | ICD-10-CM | POA: Diagnosis not present

## 2016-03-27 DIAGNOSIS — N6310 Unspecified lump in the right breast, unspecified quadrant: Secondary | ICD-10-CM | POA: Diagnosis not present

## 2016-03-31 MED FILL — XULANE PATCH: 150-35 | 28 days supply | Qty: 3 | Fill #2

## 2016-04-01 DIAGNOSIS — K921 Melena: Secondary | ICD-10-CM | POA: Diagnosis not present

## 2016-04-01 DIAGNOSIS — E663 Overweight: Secondary | ICD-10-CM | POA: Diagnosis not present

## 2016-04-01 DIAGNOSIS — K912 Postsurgical malabsorption, not elsewhere classified: Secondary | ICD-10-CM | POA: Diagnosis not present

## 2016-04-01 DIAGNOSIS — Z9884 Bariatric surgery status: Secondary | ICD-10-CM | POA: Diagnosis not present

## 2016-04-01 MED FILL — oxyCODONE HCL 5 MG TABS: 5 | 20 days supply | Qty: 20 | Fill #0

## 2016-04-01 MED FILL — PANTOPRAZOLE SOD DR 40 MG T: 40 | 30 days supply | Qty: 30 | Fill #1

## 2016-04-04 ENCOUNTER — Emergency Department (HOSPITAL_COMMUNITY)
Admission: EM | Admit: 2016-04-04 | Discharge: 2016-04-05 | Disposition: A | Discharge status: Home / Self Care | Payer: 59 | Attending: Emergency Medicine | Admitting: Emergency Medicine

## 2016-04-04 ENCOUNTER — Encounter (HOSPITAL_COMMUNITY): Payer: Self-pay | Admitting: Emergency Medicine

## 2016-04-04 DIAGNOSIS — K9423 Gastrostomy malfunction: Secondary | ICD-10-CM | POA: Diagnosis not present

## 2016-04-04 DIAGNOSIS — K9429 Other complications of gastrostomy: Secondary | ICD-10-CM | POA: Diagnosis not present

## 2016-04-04 DIAGNOSIS — Z789 Other specified health status: Secondary | ICD-10-CM

## 2016-04-04 DIAGNOSIS — Z79899 Other long term (current) drug therapy: Secondary | ICD-10-CM | POA: Diagnosis not present

## 2016-04-04 DIAGNOSIS — R109 Unspecified abdominal pain: Secondary | ICD-10-CM | POA: Diagnosis not present

## 2016-04-04 NOTE — ED Notes (Signed)
Pt reports that this evening feeding tube extended out and will not go back into place from initial positioning. Pt states she does night time feedings and has not had any difficulties recently. Pt stated that last tube was placed on 01/30/16.

## 2016-04-04 NOTE — ED Triage Notes (Signed)
Pts feeding tube dislodged about 2130 tonight

## 2016-04-04 NOTE — ED Notes (Signed)
Awaiting provider to sign up for pt. Pt requesting pain medications.

## 2016-04-05 ENCOUNTER — Emergency Department (HOSPITAL_COMMUNITY): Payer: 59

## 2016-04-05 DIAGNOSIS — Z4682 Encounter for fitting and adjustment of non-vascular catheter: Secondary | ICD-10-CM | POA: Diagnosis not present

## 2016-04-05 DIAGNOSIS — Z79899 Other long term (current) drug therapy: Secondary | ICD-10-CM | POA: Diagnosis not present

## 2016-04-05 DIAGNOSIS — K9429 Other complications of gastrostomy: Secondary | ICD-10-CM | POA: Diagnosis not present

## 2016-04-05 MED ORDER — FENTANYL CITRATE (PF) 100 MCG/2ML IJ SOLN
100.0000 ug | Freq: Once | INTRAMUSCULAR | Status: AC
  Administered 2016-04-05: 100 ug via INTRAVENOUS
  Filled 2016-04-05: qty 2

## 2016-04-05 MED ORDER — HYDROMORPHONE HCL 1 MG/ML IJ SOLN
1.0000 mg | Freq: Once | INTRAMUSCULAR | Status: AC
  Administered 2016-04-05: 1 mg via INTRAVENOUS
  Filled 2016-04-05: qty 1

## 2016-04-05 MED ORDER — ONDANSETRON HCL 4 MG/2ML IJ SOLN
4.0000 mg | Freq: Once | INTRAMUSCULAR | Status: AC
  Administered 2016-04-05: 4 mg via INTRAVENOUS
  Filled 2016-04-05: qty 2

## 2016-04-05 MED ORDER — FENTANYL CITRATE (PF) 100 MCG/2ML IJ SOLN: 50.0000 ug | Freq: Once | INTRAMUSCULAR | Status: DC

## 2016-04-05 MED ORDER — DIAZEPAM 5 MG PO TABS
10.0000 mg | ORAL_TABLET | Freq: Once | ORAL | Status: AC
  Administered 2016-04-05: 10 mg via ORAL
  Filled 2016-04-05: qty 2

## 2016-04-05 MED ORDER — OXYCODONE HCL 5 MG PO TABS: 5.0000 mg | ORAL_TABLET | Freq: Four times a day (QID) | ORAL | 0 refills | Status: DC | PRN

## 2016-04-05 MED ORDER — HYDROMORPHONE HCL 1 MG/ML IJ SOLN
1.0000 mg | Freq: Once | INTRAMUSCULAR | Status: AC
Start: 2016-04-05 — End: 2016-04-05
  Administered 2016-04-05: 1 mg via INTRAVENOUS
  Filled 2016-04-05: qty 1

## 2016-04-05 MED ORDER — METOCLOPRAMIDE HCL 5 MG/ML IJ SOLN
10.0000 mg | Freq: Once | INTRAMUSCULAR | Status: AC
  Administered 2016-04-05: 10 mg via INTRAVENOUS
  Filled 2016-04-05: qty 2

## 2016-04-05 MED ORDER — SODIUM CHLORIDE 0.9 % IV SOLN
Freq: Once | INTRAVENOUS | Status: AC
  Administered 2016-04-05: 02:00:00 via INTRAVENOUS

## 2016-04-05 NOTE — ED Notes (Signed)
Verbal order for Zofan 4mg  IVP and Fentanyl 121mcg IVP given due to procedure of reinserting tube

## 2016-04-05 NOTE — ED Notes (Signed)
Pt returned from Radiology after g-tube came out during contrast insertion in radiology. PA notified

## 2016-04-05 NOTE — ED Notes (Signed)
Patient transported to X-ray 

## 2016-04-05 NOTE — Progress Notes (Signed)
Attempted replacement of 59F gastrostomy tube.  62F foley removed and 59F g-tube replaced to about 2cm in depth, but significant resistance met at this time.  Due to discomfort and no image guidance (and history of roux-en-Y) it was not forced.  The foley was replaced.  She has significant leakage around the foley as it is a smaller french.  Gauze and tape are replaced around the foley catheter.  She will be discharged home and an order has been placed for her to come back ASAP to get this exchanged.  The patient is ok with this.  Huntleigh Doolen E 9:36 AM 04/05/2016

## 2016-04-05 NOTE — ED Provider Notes (Signed)
Care assumed from previous provider PA [place]. Please see note for further details. Case discussed, plan agreed upon. Patient needs G-tube replaced. Currently has 20 [place] Foley catheter in place, but will need IR g-tube placement.   7:15 AM - Spoke with IR who will have PA come evaluate patient in the ER.   Patient evaluated by IR team. Please see their consultation note for further details. IR has replaced foley at the bedside and believes that patient is appropriate for outpatient follow-up for G-tube replacement. IR gave home care instructions and follow-up care instructions. I discussed these with patient as well as he feels comfortable with the plan. IR to call patient, likely tomorrow. If she does not receive a call, call radiology to inquire. Return precautions discussed and all questions answered.    [facility] Jamis Kryder, PA-C 04/05/16 [place], MD 04/05/16 [phone]

## 2016-04-05 NOTE — ED Notes (Addendum)
Equipment at bedside to reinsert feeding tube.

## 2016-04-05 NOTE — ED Provider Notes (Signed)
[facility] DEPT Provider Note   CSN: [id number] Arrival date & time: 04/04/16  2230  By signing my name below, I, Judithe Modest, attest that this documentation has been prepared under the direction and in the presence of [employer], PA-C. Electronically Signed: Judithe Modest, ER Scribe. 10/19/2015. 6:57 AM.  History   Chief Complaint Chief Complaint  Patient presents with  . Feeding Tube Dislodged   HPI  HPI Comments: Margaret Fletcher is a 46 y.o. female who presents to the Emergency Department complaining of having her feeding tube (G tube) displaced around 9:45pm tonight while she was having a BM. She endorses associated nausea and severe abdominal pain. Her feeding tube has come out in the past and she had to have it replaced in the IR.  Patient reports last replacement was in November and took 6 hours and IR.  She reports symptoms of the same today. She's attempted to relocate the tube herself without success. She is unable to flush the tube or deflate the bulb. Her last pain medication was 2.5mg  of oxycodone at 4pm.    Past Medical History:  Diagnosis Date  . Alopecia   . Anxiety   . Autoimmune disease ([place])    "Alopecia"  . Bacterial vaginosis   . Cyst of spleen   . Depression   . Depression   . Frequent UTI   . Hypertrophy of breast   . IIH (idiopathic intracranial hypertension) 2017   "related to headaches"  . Migraine   . Migraine headache    denies, ruled out - Propranolol.Using for headaches    Patient Active Problem List   Diagnosis Date Noted  . Hypokalemia 02/22/2016  . Constipation, chronic 02/22/2016  . Chronic nausea 02/22/2016  . Epigastric pain 02/19/2016  . Bacteremia 09/12/2015  . Protein-calorie malnutrition, moderate ([place]) 08/19/2015  . Prediabetes 07/08/2015  . Morbid obesity ([place]) 07/08/2015  . Mild obstructive sleep apnea 07/08/2015  . Dyslipidemia 07/08/2015  . S/P gastric bypass 07/08/2015  . IIH (idiopathic  intracranial hypertension) 06/03/2015  . Chronic migraine without aura or status migrainosus 04/30/2015  . Worsening headaches 04/30/2015  . Vision changes 04/30/2015  . Perceived hearing changes 04/30/2015  . Liver lesion 03/21/2013  . Splenic cyst 03/21/2013  . Abdominal pain 03/21/2013    Past Surgical History:  Procedure Laterality Date  . APPENDECTOMY    . BREAST REDUCTION SURGERY Bilateral 03/26/2015   Procedure: BILATERAL BREAST REDUCTION  ;  Surgeon: Youlanda Roys, MD;  Location: [facility];  Service: Plastics;  Laterality: Bilateral;  . BREATH TEK H PYLORI N/A 05/21/2014   Procedure: BREATH TEK H PYLORI;  Surgeon: Greer Pickerel, MD;  Location: [facility];  Service: General;  Laterality: N/A;  . CESAREAN SECTION    . CESAREAN SECTION    . CHOLECYSTECTOMY    . DILITATION & CURRETTAGE/HYSTROSCOPY WITH NOVASURE ABLATION N/A 07/19/2012   Procedure: HYSTEROSCOPY WITH NOVASURE ABLATION;  Surgeon: Farrel Gobble. Harrington Challenger, MD;  Location: [facility];  Service: Gynecology;  Laterality: N/A;  . ENDOMETRIAL ABLATION    . ESOPHAGOGASTRODUODENOSCOPY N/A 08/16/2015   Procedure: UPPER ESOPHAGOGASTRODUODENOSCOPY (EGD);  Surgeon: Greer Pickerel, MD;  Location: [facility];  Service: General;  Laterality: N/A;  . ESOPHAGOGASTRODUODENOSCOPY N/A 02/21/2016   Procedure: ESOPHAGOGASTRODUODENOSCOPY (EGD);  Surgeon: Greer Pickerel, MD;  Location: [facility];  Service: General;  Laterality: N/A;  . IR GENERIC HISTORICAL  10/04/2015   IR [place] Vivianne Master 10/04/2015 Markus Daft, MD [facility]  . IR  GENERIC HISTORICAL  01/31/2016   IR [place] GASTRO/COLONIC TUBE PERCUT W/FLUORO 01/31/2016 Corrie Mckusick, DO [facility]  . LAPAROSCOPIC GASTROSTOMY N/A 09/30/2015   Procedure: LAPAROSCOPIC GASTROSTOMY TUBE PLACEMENT;  Surgeon: Greer Pickerel, MD;  Location: [facility];  Service: General;  Laterality: N/A;  . LAPAROSCOPIC ROUX-EN-Y GASTRIC BYPASS WITH HIATAL HERNIA REPAIR N/A 07/08/2015    Procedure: LAPAROSCOPIC ROUX-EN-Y GASTRIC BYPASS WITH HIATAL HERNIA REPAIR WITH UPPER ENDOSCOPY;  Surgeon: Greer Pickerel, MD;  Location: [facility];  Service: General;  Laterality: N/A;  . LAPAROSCOPIC SMALL BOWEL RESECTION N/A 09/30/2015   Procedure: LAPAROSCOPIC REVISION OF ROUX LIMB;  Surgeon: Greer Pickerel, MD;  Location: [facility];  Service: General;  Laterality: N/A;  . LAPAROSCOPY N/A 09/30/2015   Procedure: LAPAROSCOPY DIAGNOSTIC;  Surgeon: Greer Pickerel, MD;  Location: [facility];  Service: General;  Laterality: N/A;  . TUBAL LIGATION    . WISDOM TOOTH EXTRACTION      OB History    No data available       Home Medications    Prior to Admission medications   Medication Sig Start Date End Date Taking? Authorizing Provider  ALPRAZolam Duanne Moron) 0.5 MG tablet Take 1 tablet (0.5 mg total) by mouth 2 (two) times daily as needed for anxiety. 03/16/16  Yes Shawnee Knapp, MD  CALCIUM-VITAMIN D PO Take 15 mLs by mouth 3 (three) times daily.   Yes Historical Provider, MD  Cyanocobalamin (VITAMIN B-12) 1000 MCG/15ML LIQD Take 5 mLs by mouth daily.   Yes Historical Provider, MD  docusate sodium (COLACE) 100 MG capsule Take 100 mg by mouth daily.   Yes Historical Provider, MD  lactulose (CHRONULAC) 10 GM/15ML solution Take 30 g by mouth daily as needed for moderate constipation.  01/14/16  Yes Historical Provider, MD  linaclotide Rolan Lipa) 145 MCG CAPS capsule Take 1 capsule (145 mcg total) by mouth daily before breakfast. 02/25/16  Yes Greer Pickerel, MD  Multiple Vitamin (MULTIVITAMIN) LIQD Take 5 mLs by mouth 2 (two) times daily.   Yes Historical Provider, MD  Nutritional Supplements (FEEDING SUPPLEMENT, VITAL HIGH PROTEIN,) LIQD liquid Place 1,000 mLs into feeding tube daily. 80ml/hr for 8hrs a day as needed for poor oral intake; 300 ml free water q4hr 02/24/16  Yes Greer Pickerel, MD  ondansetron (ZOFRAN-ODT) 4 MG disintegrating tablet Take 1 tablet (4 mg total) by mouth every 6 (six) hours as needed for nausea. 02/24/16   Yes Greer Pickerel, MD  oxyCODONE (OXY IR/ROXICODONE) 5 MG immediate release tablet Take 5 mg by mouth every 6 (six) hours as needed for moderate pain.  04/01/16  Yes Historical Provider, MD  oxyCODONE (ROXICODONE) 5 MG/5ML solution Take 2.5 mLs (2.5 mg total) by mouth every 6 (six) hours as needed for moderate pain or severe pain. 02/24/16  Yes Jerrye Beavers, PA-C  pantoprazole (PROTONIX) 40 MG tablet Take 1 tablet (40 mg total) by mouth daily. 02/24/16  Yes Greer Pickerel, MD  polyethylene glycol [facility] / GLYCOLAX) packet Take 17 g by mouth daily.   Yes Historical Provider, MD  sertraline (ZOLOFT) 25 MG tablet Take 1 tablet (25 mg total) by mouth daily. 03/16/16  Yes Shawnee Knapp, MD  simethicone (MYLICON) 80 MG chewable tablet Chew 80 mg by mouth every 6 (six) hours as needed for flatulence.   Yes Historical Provider, MD  sucralfate (CARAFATE) 1 g tablet Take 1 tablet (1 g total) by mouth 4 (four) times daily -  with meals and at bedtime. 02/24/16  Yes Greer Pickerel, MD  topiramate (TOPAMAX) 100 MG tablet Take 1 tablet (100 mg total) by mouth at bedtime. 12/03/15  Yes Melvenia Beam, MD  [name] 150-35 MCG/24HR transdermal patch Place 2 patches onto the skin once a week. 02/11/16  Yes Historical Provider, MD    Family History Family History  Problem Relation Age of Onset  . Cancer Mother   . Stroke Father   . Diabetes Brother   . Seizures Son   . Migraines Neg Hx     Social History Social History  Substance Use Topics  . Smoking status: Never Smoker  . Smokeless tobacco: Never Used  . Alcohol use Yes     Comment: socially     Allergies   Sulfa antibiotics and Zosyn [piperacillin sod-tazobactam so]   Review of Systems Review of Systems  Gastrointestinal: Positive for abdominal pain and nausea.  All other systems reviewed and are negative.    Physical Exam Updated Vital Signs BP (!) 100/51 (BP Location: Left Arm)   Pulse 64   Temp 99.4 F (37.4 C) (Oral)   Resp 16   Ht 5\' 8"   (1.727 m)   Wt 72.2 kg   SpO2 100%   BMI 24.19 kg/m   Physical Exam  Constitutional: She appears well-developed and well-nourished.  HENT:  Head: Normocephalic and atraumatic.  Mouth/Throat: Oropharynx is clear and moist.  Eyes: Conjunctivae are normal. No scleral icterus.  Cardiovascular: Normal rate, regular rhythm and intact distal pulses.   Pulmonary/Chest: Effort normal and breath sounds normal.  Abdominal: Soft. Bowel sounds are normal. She exhibits no distension and no mass. There is tenderness. There is guarding. There is no rebound.  G tube is in place but has backed out approximately 3 inches. TTP around the site. No erythema induration or bleeding. Abdomen is generally TTP.   Neurological: She is alert.  Skin: Skin is warm and dry.  Psychiatric: She has a normal mood and affect.  Nursing note and vitals reviewed.   ED Treatments / Results  DIAGNOSTIC STUDIES: Oxygen Saturation is 100% on RA, normal by my interpretation.    COORDINATION OF CARE: 12:22 AM Discussed treatment plan with pt at bedside and pt agreed to plan.  Radiology Dg Abdomen Peg Tube Location  Result Date: 04/05/2016 CLINICAL DATA:  46 year old female with G-tube placement. 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 Contrast injected through the tube opacifies the stomach. There is no bowel dilatation or evidence of obstruction. Moderate stool noted throughout the colon. Right upper quadrant cholecystectomy clips as well as anastomotic sutures in the left lower quadrant noted. The soft tissues and osseous structures are grossly unremarkable. IMPRESSION: Contrast injected via the G-tube opacifies the stomach. Electronically Signed   By: Anner Crete M.D.   On: 04/05/2016 03:22    Procedures FEEDING TUBE REPLACEMENT Date/Time: 04/05/2016 6:55 AM Performed by: Abigail Butts Authorized by:  Abigail Butts  Consent: Verbal consent obtained. Risks and benefits: risks, benefits and alternatives were discussed Consent given by: patient Patient understanding: patient states understanding of the procedure being performed Patient consent: the patient's understanding of the procedure matches consent given Procedure consent: procedure consent matches procedure scheduled Relevant documents: relevant documents present and verified Test results: test results available and properly labeled Site marked: the operative site was marked Required items: required blood products, implants, devices, and special equipment available Patient identity confirmed: verbally with patient and arm band Time out: Immediately  prior to procedure a "time out" was called to verify the correct patient, procedure, equipment, support staff and site/side marked as required. Preparation: Patient was prepped and draped in the usual sterile fashion. Indications: tube dislodged Local anesthesia used: no  Anesthesia: Local anesthesia used: no  Sedation: Patient sedated: no Tube type: gastrostomy Patient position: supine Procedure type: replacement Tube size: 44F foley cath. Endoscope used: no Bulb inflation volume: 5 (ml) Bulb inflation fluid: normal saline Placement/position confirmation: x-ray Tube placement difficulty: moderate Patient tolerance: Patient tolerated the procedure well with no immediate complications Comments: Attempted to place a 47 [place] G-tube without success. 44 French Foley catheter was able to be placed after multiple attempts. X-ray confirms placement.    (including critical care time)  Medications Ordered in ED Medications  HYDROmorphone (DILAUDID) injection 1 mg (1 mg Intravenous Given 04/05/16 0036)  ondansetron (ZOFRAN) injection 4 mg (4 mg Intravenous Given 04/05/16 0032)  diazepam (VALIUM) tablet 10 mg (10 mg Oral Given 04/05/16 0036)  HYDROmorphone (DILAUDID) injection 1  mg (1 mg Intravenous Given 04/05/16 0128)  fentaNYL (SUBLIMAZE) injection 100 mcg (100 mcg Intravenous Given 04/05/16 0203)  ondansetron (ZOFRAN) injection 4 mg (4 mg Intravenous Given 04/05/16 0203)  0.9 %  sodium chloride infusion ( Intravenous New Bag/Given 04/05/16 0206)  metoCLOPramide (REGLAN) injection 10 mg (10 mg Intravenous Given 04/05/16 0248)     Initial Impression / Assessment and Plan / ED Course  I have reviewed the triage vital signs and the nursing notes.  Pertinent labs & imaging results that were available during my care of the patient were reviewed by me and considered in my medical decision making (see chart for details).  Clinical Course as of Apr 06 655  Sun Apr 05, 2016  0653 Patient will need insertion of G-tube by IR this morning. At shift change care transferred to [facility] PA-C who will follow pt and contact IR.  [HM]    Clinical Course User Index [HM] Jarrett Soho Matei Magnone, PA-C    Pt presents With dislodged G-tube. Unable to replace with identical G-tube. 12 French Foley catheter placed. Patient will need official replacement by IR this morning.  Final Clinical Impressions(s) / ED Diagnoses   Final diagnoses:  Gastrostomy tube dysfunction [facility])    New Prescriptions New Prescriptions   No medications on file     I personally performed the services described in this documentation, which was scribed in my presence. The recorded information has been reviewed and is accurate.        Jarrett Soho Persia Lintner, PA-C 04/05/16 [id number]    Sherwood Gambler, MD 04/10/16 [phone]

## 2016-04-05 NOTE — ED Notes (Signed)
Radiology notified for confirmation of tube placement of G-TUBE

## 2016-04-05 NOTE — Discharge Instructions (Signed)
Pain medication as needed for severe pain - This can make you very drowsy - please do not drink alcohol, operate heavy machinery or drive on this medication. You should be getting a phone call from the interventional radiology team tomorrow. If you have not heard from them by earlier afternoon, please call the radiology department to inquire about this. Return to ER for new or worsening symptoms, any additional concerns.

## 2016-04-06 ENCOUNTER — Encounter (HOSPITAL_COMMUNITY): Payer: Self-pay | Admitting: Interventional Radiology

## 2016-04-06 ENCOUNTER — Other Ambulatory Visit (HOSPITAL_COMMUNITY): Payer: Self-pay | Admitting: General Surgery

## 2016-04-06 ENCOUNTER — Ambulatory Visit (HOSPITAL_COMMUNITY)
Admission: RE | Admit: 2016-04-06 | Discharge: 2016-04-06 | Disposition: A | Discharge status: Home / Self Care | Payer: 59 | Source: Ambulatory Visit | Attending: General Surgery | Admitting: General Surgery

## 2016-04-06 DIAGNOSIS — K9423 Gastrostomy malfunction: Secondary | ICD-10-CM

## 2016-04-06 DIAGNOSIS — Z431 Encounter for attention to gastrostomy: Secondary | ICD-10-CM | POA: Insufficient documentation

## 2016-04-06 HISTORY — PX: IR GENERIC HISTORICAL: IMG1180011

## 2016-04-06 MED ORDER — LIDOCAINE HCL (PF) 1 % IJ SOLN
INTRAMUSCULAR | Status: AC
Start: 2016-04-06 — End: 2016-04-06
  Administered 2016-04-06: 5 mL
  Filled 2016-04-06: qty 30

## 2016-04-06 MED ORDER — FENTANYL CITRATE (PF) 100 MCG/2ML IJ SOLN
INTRAMUSCULAR | Status: AC
  Filled 2016-04-06: qty 2

## 2016-04-06 MED ORDER — FENTANYL CITRATE (PF) 100 MCG/2ML IJ SOLN
INTRAMUSCULAR | Status: AC | PRN
  Administered 2016-04-06 (×2): 50 ug via INTRAVENOUS

## 2016-04-06 MED ORDER — IOPAMIDOL (ISOVUE-300) INJECTION 61%
INTRAVENOUS | Status: AC
  Administered 2016-04-06: 15 mL
  Filled 2016-04-06: qty 50

## 2016-04-06 MED ORDER — LIDOCAINE VISCOUS 2 % MT SOLN
OROMUCOSAL | Status: AC
  Administered 2016-04-06: 5 mL
  Filled 2016-04-06: qty 15

## 2016-04-06 NOTE — Procedures (Signed)
Replace G tube under fluoro with 91F balloon retention device No complication No blood loss. See complete dictation in [facility].

## 2016-04-07 ENCOUNTER — Other Ambulatory Visit (HOSPITAL_COMMUNITY): Payer: 59

## 2016-04-07 ENCOUNTER — Ambulatory Visit: Payer: 59 | Admitting: Neurology

## 2016-04-10 ENCOUNTER — Encounter (HOSPITAL_COMMUNITY): Payer: Self-pay

## 2016-04-10 ENCOUNTER — Emergency Department (HOSPITAL_COMMUNITY)
Admission: EM | Admit: 2016-04-10 | Discharge: 2016-04-11 | Disposition: A | Discharge status: Home / Self Care | Payer: 59 | Attending: Emergency Medicine | Admitting: Emergency Medicine

## 2016-04-10 DIAGNOSIS — R1013 Epigastric pain: Secondary | ICD-10-CM | POA: Insufficient documentation

## 2016-04-10 LAB — CBC WITH DIFFERENTIAL/PLATELET
BASOS ABS: 0.1 10*3/uL (ref 0.0–0.1)
BASOS PCT: 1 %
EOS ABS: 0.2 10*3/uL (ref 0.0–0.7)
EOS PCT: 2 %
HEMATOCRIT: 35.7 % — AB (ref 36.0–46.0)
Hemoglobin: 12.1 g/dL (ref 12.0–15.0)
Lymphocytes Relative: 18 %
Lymphs Abs: 1.6 10*3/uL (ref 0.7–4.0)
MCH: 30.6 pg (ref 26.0–34.0)
MCHC: 33.9 g/dL (ref 30.0–36.0)
MCV: 90.4 fL (ref 78.0–100.0)
MONO ABS: 0.6 10*3/uL (ref 0.1–1.0)
MONOS PCT: 7 %
NEUTROS ABS: 6.4 10*3/uL (ref 1.7–7.7)
Neutrophils Relative %: 72 %
PLATELETS: 346 10*3/uL (ref 150–400)
RBC: 3.95 MIL/uL (ref 3.87–5.11)
RDW: 12.8 % (ref 11.5–15.5)
WBC: 8.9 10*3/uL (ref 4.0–10.5)

## 2016-04-10 MED ORDER — SODIUM CHLORIDE 0.9 % IV BOLUS (SEPSIS)
1000.0000 mL | Freq: Once | INTRAVENOUS | Status: AC
  Administered 2016-04-10: 1000 mL via INTRAVENOUS

## 2016-04-10 MED ORDER — ONDANSETRON HCL 4 MG/2ML IJ SOLN
4.0000 mg | Freq: Once | INTRAMUSCULAR | Status: AC
  Administered 2016-04-10: 4 mg via INTRAVENOUS
  Filled 2016-04-10: qty 2

## 2016-04-10 MED ORDER — HYDROMORPHONE HCL 1 MG/ML IJ SOLN
1.0000 mg | Freq: Once | INTRAMUSCULAR | Status: AC
  Administered 2016-04-10: 1 mg via INTRAVENOUS
  Filled 2016-04-10: qty 1

## 2016-04-10 NOTE — ED Provider Notes (Signed)
[facility] DEPT Provider Note   CSN: [id number] Arrival date & time: 04/10/16  2238  By signing my name below, I, Dora Sims, attest that this documentation has been prepared under the direction and in the presence of physician practitioner, Delora Fuel, MD. Electronically Signed: Dora Sims, Scribe. 04/10/2016. 11:08 PM.  History   Chief Complaint Chief Complaint  Patient presents with  . Abdominal Pain  . Emesis    The history is provided by the patient. No language interpreter was used.     HPI Comments: Margaret Fletcher is a 46 y.o. female who presents to the Emergency Department complaining of constant, severe, 8/10, epigastric abdominal pain beginning a few hours ago. She states the pain is non-radiating. Pt reports associated nausea and "dry heaving" but specifically denies vomiting. Pt reports she has experienced the exact same pain intermittently since having gastric bypass surgery in 2017. She notes her last episode of abdominal pain prior to tonight was shortly before Christmas 2017. She has oxycodone 2.5-5 mg and Carafate at home and took them PTA with no improvement of her abdominal pain; she states her pain is normally controlled with these medications. She also has Zofran at home and used it with some improvement of her nausea. Pt reports constipation at baseline secondary to consistent pain medication use and denies acute change in this condition. Pt denies diarrhea or any other associated symptoms.  Past Medical History:  Diagnosis Date  . Alopecia   . Anxiety   . Autoimmune disease ([place])    "Alopecia"  . Bacterial vaginosis   . Cyst of spleen   . Depression   . Depression   . Frequent UTI   . Hypertrophy of breast   . IIH (idiopathic intracranial hypertension) 2017   "related to headaches"  . Migraine   . Migraine headache    denies, ruled out - Propranolol.Using for headaches    Patient Active Problem List   Diagnosis Date Noted  . Hypokalemia  02/22/2016  . Constipation, chronic 02/22/2016  . Chronic nausea 02/22/2016  . Epigastric pain 02/19/2016  . Bacteremia 09/12/2015  . Protein-calorie malnutrition, moderate ([place]) 08/19/2015  . Prediabetes 07/08/2015  . Morbid obesity ([place]) 07/08/2015  . Mild obstructive sleep apnea 07/08/2015  . Dyslipidemia 07/08/2015  . S/P gastric bypass 07/08/2015  . IIH (idiopathic intracranial hypertension) 06/03/2015  . Chronic migraine without aura or status migrainosus 04/30/2015  . Worsening headaches 04/30/2015  . Vision changes 04/30/2015  . Perceived hearing changes 04/30/2015  . Liver lesion 03/21/2013  . Splenic cyst 03/21/2013  . Abdominal pain 03/21/2013    Past Surgical History:  Procedure Laterality Date  . APPENDECTOMY    . BREAST REDUCTION SURGERY Bilateral 03/26/2015   Procedure: BILATERAL BREAST REDUCTION  ;  Surgeon: Youlanda Roys, MD;  Location: [facility];  Service: Plastics;  Laterality: Bilateral;  . BREATH TEK H PYLORI N/A 05/21/2014   Procedure: BREATH TEK H PYLORI;  Surgeon: Greer Pickerel, MD;  Location: [facility];  Service: General;  Laterality: N/A;  . CESAREAN SECTION    . CESAREAN SECTION    . CHOLECYSTECTOMY    . DILITATION & CURRETTAGE/HYSTROSCOPY WITH NOVASURE ABLATION N/A 07/19/2012   Procedure: HYSTEROSCOPY WITH NOVASURE ABLATION;  Surgeon: Farrel Gobble. Harrington Challenger, MD;  Location: [facility];  Service: Gynecology;  Laterality: N/A;  . ENDOMETRIAL ABLATION    . ESOPHAGOGASTRODUODENOSCOPY N/A 08/16/2015   Procedure: UPPER ESOPHAGOGASTRODUODENOSCOPY (EGD);  Surgeon: Greer Pickerel, MD;  Location: [facility];  Service: General;  Laterality: N/A;  . ESOPHAGOGASTRODUODENOSCOPY N/A 02/21/2016   Procedure: ESOPHAGOGASTRODUODENOSCOPY (EGD);  Surgeon: Greer Pickerel, MD;  Location: [facility];  Service: General;  Laterality: N/A;  . IR GENERIC HISTORICAL  10/04/2015   IR [place] Vivianne Master 10/04/2015 Markus Daft, MD [facility]  . IR  GENERIC HISTORICAL  01/31/2016   IR [place] GASTRO/COLONIC TUBE PERCUT W/FLUORO 01/31/2016 Corrie Mckusick, DO [facility]  . IR GENERIC HISTORICAL  04/06/2016   IR [place] TUBE PERCUT W/FLUORO 04/06/2016 Arne Cleveland, MD [facility]  . LAPAROSCOPIC GASTROSTOMY N/A 09/30/2015   Procedure: LAPAROSCOPIC GASTROSTOMY TUBE PLACEMENT;  Surgeon: Greer Pickerel, MD;  Location: [facility];  Service: General;  Laterality: N/A;  . LAPAROSCOPIC ROUX-EN-Y GASTRIC BYPASS WITH HIATAL HERNIA REPAIR N/A 07/08/2015   Procedure: LAPAROSCOPIC ROUX-EN-Y GASTRIC BYPASS WITH HIATAL HERNIA REPAIR WITH UPPER ENDOSCOPY;  Surgeon: Greer Pickerel, MD;  Location: [facility];  Service: General;  Laterality: N/A;  . LAPAROSCOPIC SMALL BOWEL RESECTION N/A 09/30/2015   Procedure: LAPAROSCOPIC REVISION OF ROUX LIMB;  Surgeon: Greer Pickerel, MD;  Location: [facility];  Service: General;  Laterality: N/A;  . LAPAROSCOPY N/A 09/30/2015   Procedure: LAPAROSCOPY DIAGNOSTIC;  Surgeon: Greer Pickerel, MD;  Location: [facility];  Service: General;  Laterality: N/A;  . TUBAL LIGATION    . WISDOM TOOTH EXTRACTION      OB History    No data available       Home Medications    Prior to Admission medications   Medication Sig Start Date End Date Taking? Authorizing Provider  ALPRAZolam Duanne Moron) 0.5 MG tablet Take 1 tablet (0.5 mg total) by mouth 2 (two) times daily as needed for anxiety. 03/16/16   Shawnee Knapp, MD  CALCIUM-VITAMIN D PO Take 15 mLs by mouth 3 (three) times daily.    Historical Provider, MD  Cyanocobalamin (VITAMIN B-12) 1000 MCG/15ML LIQD Take 5 mLs by mouth daily.    Historical Provider, MD  docusate sodium (COLACE) 100 MG capsule Take 100 mg by mouth daily.    Historical Provider, MD  lactulose (CHRONULAC) 10 GM/15ML solution Take 30 g by mouth daily as needed for moderate constipation.  01/14/16   Historical Provider, MD  linaclotide Rolan Lipa) 145 MCG CAPS capsule Take 1 capsule (145 mcg total) by mouth daily before breakfast. 02/25/16    Greer Pickerel, MD  Multiple Vitamin (MULTIVITAMIN) LIQD Take 5 mLs by mouth 2 (two) times daily.    Historical Provider, MD  Nutritional Supplements (FEEDING SUPPLEMENT, VITAL HIGH PROTEIN,) LIQD liquid Place 1,000 mLs into feeding tube daily. 69ml/hr for 8hrs a day as needed for poor oral intake; 300 ml free water q4hr 02/24/16   Greer Pickerel, MD  ondansetron (ZOFRAN-ODT) 4 MG disintegrating tablet Take 1 tablet (4 mg total) by mouth every 6 (six) hours as needed for nausea. 02/24/16   Greer Pickerel, MD  oxyCODONE (OXY IR/ROXICODONE) 5 MG immediate release tablet Take 1 tablet (5 mg total) by mouth every 6 (six) hours as needed for moderate pain. 04/05/16   Ozella Almond Ward, PA-C  pantoprazole (PROTONIX) 40 MG tablet Take 1 tablet (40 mg total) by mouth daily. 02/24/16   Greer Pickerel, MD  polyethylene glycol [facility] / Floria Raveling) packet Take 17 g by mouth daily.    Historical Provider, MD  sertraline (ZOLOFT) 25 MG tablet Take 1 tablet (25 mg total) by mouth daily. 03/16/16   Shawnee Knapp, MD  simethicone (MYLICON) 80 MG chewable tablet Chew 80 mg by mouth every 6 (six) hours  as needed for flatulence.    Historical Provider, MD  sucralfate (CARAFATE) 1 g tablet Take 1 tablet (1 g total) by mouth 4 (four) times daily -  with meals and at bedtime. 02/24/16   Greer Pickerel, MD  topiramate (TOPAMAX) 100 MG tablet Take 1 tablet (100 mg total) by mouth at bedtime. 12/03/15   Melvenia Beam, MD  [name] 150-35 MCG/24HR transdermal patch Place 2 patches onto the skin once a week. 02/11/16   Historical Provider, MD    Family History Family History  Problem Relation Age of Onset  . Cancer Mother   . Stroke Father   . Diabetes Brother   . Seizures Son   . Migraines Neg Hx     Social History Social History  Substance Use Topics  . Smoking status: Never Smoker  . Smokeless tobacco: Never Used  . Alcohol use Yes     Comment: socially     Allergies   Sulfa antibiotics and Zosyn [piperacillin sod-tazobactam  so]   Review of Systems Review of Systems  Gastrointestinal: Positive for abdominal pain, constipation (baseline) and nausea. Negative for diarrhea and vomiting.  All other systems reviewed and are negative.    Physical Exam Updated Vital Signs BP 158/99 (BP Location: Left Arm)   Pulse 70   Temp 98.3 F (36.8 C) (Oral)   Resp 20   Ht 5\' 8"  (1.727 m)   Wt 160 lb (72.6 kg)   SpO2 99%   BMI 24.33 kg/m   Physical Exam  Constitutional: She is oriented to person, place, and time. She appears well-developed and well-nourished.  Appears to be in pain.  HENT:  Head: Normocephalic and atraumatic.  Eyes: EOM are normal. Pupils are equal, round, and reactive to light.  Neck: Normal range of motion. Neck supple. No JVD present.  Cardiovascular: Normal rate, regular rhythm and normal heart sounds.   No murmur heard. Pulmonary/Chest: Effort normal and breath sounds normal. She has no wheezes. She has no rales. She exhibits no tenderness.  Abdominal: Soft. She exhibits no distension and no mass. Bowel sounds are decreased. There is tenderness. There is no rebound and no guarding.  Moderate epigastric tenderness. No rebound or guarding. Bowel sounds decreased. PEG tube present in LUQ.  Musculoskeletal: Normal range of motion. She exhibits no edema.  Lymphadenopathy:    She has no cervical adenopathy.  Neurological: She is alert and oriented to person, place, and time. No cranial nerve deficit. She exhibits normal muscle tone. Coordination normal.  Skin: Skin is warm and dry. No rash noted.  Psychiatric: She has a normal mood and affect. Her behavior is normal. Judgment and thought content normal.  Nursing note and vitals reviewed.    ED Treatments / Results  Labs (all labs ordered are listed, but only abnormal results are displayed) Labs Reviewed  COMPREHENSIVE METABOLIC PANEL - Abnormal; Notable for the following:       Result Value   Creatinine, Ser 1.18 (*)    Calcium 8.3 (*)      Albumin 3.3 (*)    GFR calc non Af Amer 54 (*)    All other components within normal limits  CBC WITH DIFFERENTIAL/PLATELET - Abnormal; Notable for the following:    HCT 35.7 (*)    All other components within normal limits  LIPASE, BLOOD   Procedures Procedures (including critical care time)  DIAGNOSTIC STUDIES: Oxygen Saturation is 99% on RA, normal by my interpretation.    COORDINATION OF CARE: 11:16 PM  Discussed treatment plan with pt at bedside and pt agreed to plan.  Medications Ordered in ED Medications  sodium chloride 0.9 % bolus 1,000 mL (0 mLs Intravenous Stopped 04/11/16 0052)  HYDROmorphone (DILAUDID) injection 1 mg (1 mg Intravenous Given 04/10/16 2338)  ondansetron (ZOFRAN) injection 4 mg (4 mg Intravenous Given 04/10/16 2338)  HYDROmorphone (DILAUDID) injection 1 mg (1 mg Intravenous Given 04/11/16 0113)     Initial Impression / Assessment and Plan / ED Course  I have reviewed the triage vital signs and the nursing notes.  Pertinent lab results that were available during my care of the patient were reviewed by me and considered in my medical decision making (see chart for details).  Recurrent epigastric pain in patient with chronic pain following gastric bypass surgery. Presentation is fairly typical for her old records are reviewed confirming several ED visits with similar complaints, and also recent hospitalization. Will give IV fluids and ondansetron and hydromorphone and reassess.  She required a second dose of hydromorphone, but is feeling much better following above-noted treatment. Laboratory workup is unremarkable. She is discharged to resume routine care.  Final Clinical Impressions(s) / ED Diagnoses   Final diagnoses:  Epigastric pain    New Prescriptions New Prescriptions   No medications on file   I personally performed the services described in this documentation, which was scribed in my presence. The recorded information has been reviewed and is  accurate.      Delora Fuel, MD [id number] [phone]

## 2016-04-10 NOTE — ED Triage Notes (Signed)
Pt states that she is having upper abdominal pain that started a few hours ago. She is also c/o N/V. She states that she periodically gets this intractable pain. Extensive gastric hx noted. G tube in place. A&Ox4.

## 2016-04-11 DIAGNOSIS — R1013 Epigastric pain: Secondary | ICD-10-CM | POA: Diagnosis not present

## 2016-04-11 LAB — COMPREHENSIVE METABOLIC PANEL
ALBUMIN: 3.3 g/dL — AB (ref 3.5–5.0)
ALT: 17 U/L (ref 14–54)
AST: 18 U/L (ref 15–41)
Alkaline Phosphatase: 56 U/L (ref 38–126)
Anion gap: 7 (ref 5–15)
BUN: 15 mg/dL (ref 6–20)
CHLORIDE: 104 mmol/L (ref 101–111)
CO2: 26 mmol/L (ref 22–32)
Calcium: 8.3 mg/dL — ABNORMAL LOW (ref 8.9–10.3)
Creatinine, Ser: 1.18 mg/dL — ABNORMAL HIGH (ref 0.44–1.00)
GFR calc Af Amer: >60 mL/min (ref >60)
GFR calc non Af Amer: 54 mL/min — ABNORMAL LOW (ref >60)
GLUCOSE: 87 mg/dL (ref 65–99)
POTASSIUM: 3.6 mmol/L (ref 3.5–5.1)
SODIUM: 137 mmol/L (ref 135–145)
Total Bilirubin: 0.4 mg/dL (ref 0.3–1.2)
Total Protein: 7.2 g/dL (ref 6.5–8.1)

## 2016-04-11 LAB — LIPASE, BLOOD: Lipase: 24 U/L (ref 11–51)

## 2016-04-11 MED ORDER — HYDROMORPHONE HCL 1 MG/ML IJ SOLN
1.0000 mg | Freq: Once | INTRAMUSCULAR | Status: AC
Start: 2016-04-11 — End: 2016-04-11
  Administered 2016-04-11: 1 mg via INTRAVENOUS
  Filled 2016-04-11: qty 1

## 2016-04-11 NOTE — Discharge Instructions (Signed)
Continue your routine care.

## 2016-04-12 DIAGNOSIS — E43 Unspecified severe protein-calorie malnutrition: Secondary | ICD-10-CM | POA: Diagnosis not present

## 2016-04-12 DIAGNOSIS — E441 Mild protein-calorie malnutrition: Secondary | ICD-10-CM | POA: Diagnosis not present

## 2016-04-14 MED FILL — oxyCODONE HCL 5 MG TABS: 5 | 2 days supply | Qty: 10 | Fill #0

## 2016-04-14 MED FILL — CARAFATE 1 GM/10 ML SUSP: 1 | 22 days supply | Qty: 900 | Fill #1

## 2016-04-20 ENCOUNTER — Ambulatory Visit: Payer: 59 | Admitting: Dietician

## 2016-04-20 ENCOUNTER — Encounter: Payer: 59 | Attending: General Surgery | Admitting: Dietician

## 2016-04-20 DIAGNOSIS — Z713 Dietary counseling and surveillance: Secondary | ICD-10-CM | POA: Diagnosis not present

## 2016-04-20 NOTE — Progress Notes (Signed)
  Follow-up visit: 9 months Post-Operative RYGB Surgery  Medical Nutrition Therapy:  Appt start time: 400 end time: 435  Primary concerns today: Post-operative Bariatric Surgery Nutrition Management.  Margaret Fletcher returns for a bariatric nutrition follow up 9 months post op RYGB. Recently had depression medication changed and feels like it's going well. States that she feels like she is making progress! "Eating better than before." Reports that her symptoms are not as intense when she eats. Some foods still taste and smell "funny" but she has been experimenting with more variety of foods. Ate about 4 oz meat and felt uncomfortable. Still has taste and smell aversions; feels like she is retraining herself to eat certain foods. Reports that she will try to eat some chicken even if she does not like the smell. No diarrhea, vomiting, or dumping. Still struggling with constipation. Still taking vitamins. Plans to follow up with Dr. Redmond Pulling in March.   In a 24-hour period she may eat a clementine, 6 oz cup of yogurt, 1 hamburger patty, 1-1.5 water bottles (17-oz) by mouth, sometimes milk or juice.  1 can (8 oz) Vital High protein enteral formula overnight (237 calories, 20.7 grams protein, 5 grams fat, 26.7 grams carbohydrate, 7 oz water) Patient flushes tube with "small amounts of water."   Surgery date: 07/08/2015 Surgery type: RYGB Start weight at [facility]: 278 lbs on 05/24/2014 Weight today: 186.2 lbs Weight change: 4 lbs Total weight loss: 91 lbs  [facility]AN[facility]  BODY COMP RESULTS  07/01/15 07/23/15 10/31/15 02/07/16 03/16/16 04/20/16   BMI (kg/m^2) 40.4 38.6 33.4 30.4 29 28.3   Fat Mass (lbs) 138.5 131.0 103.2 84 80 74   Fat Free Mass (lbs) 127.5 122.8 116.4 116.2 110.6 112.2   Total Body Water (lbs) 93.5 90.4 84.4 83.4 79.2 80.2    Preferred Learning Style:   No preference indicated   Learning Readiness:   Ready   Fluid intake: 24 oz water and tea + 8oz can enteral formula  Estimated total protein  intake: unable to determine  Medications: see list Supplementation: liquid multivitamin Margaret Fletcher), liquid Calcium + vitamin D, B12  Drinking while eating: yes Hair loss: unknown, patient has alopecia Carbonated beverages: tried a Coke for nausea but did not like it N/V/D/C: nausea and constipation Dumping syndrome: none  Recent physical activity:  Did not assess  Progress Towards Goal(s):  In progress.  Handouts given during visit include:  none   Nutritional Diagnosis:  NI-2.1 Inadequate intake As related to food intolerance, nausea, and taste aversions.  As evidenced by patient requiring enteral nutrition support to meet nutrient needs.    Intervention:  Nutrition counseling provided. Praised patient on efforts to continue to increase po intake and meet nutrition needs. Discussed overnight enteral feeds via PEG tube to meet fluid and macronutrient needs.  Teaching Method Utilized:  Visual Auditory Hands on  Barriers to learning/adherence to lifestyle change: nausea and inability to meet nutrition needs orally  Demonstrated degree of understanding via:  Teach Back   Monitoring/Evaluation:  Dietary intake, exercise, and body weight. Follow up in 4 weeks.

## 2016-04-20 NOTE — Patient Instructions (Addendum)
-  Add moisture to meats (mayonnaise, mustard, yogurt ranch)  -Chew thoroughly, eat slowly, and take tiny bites -Experiment with some different temperatures -Control your environment (create a pleasant, simple atmosphere for trying new foods) -Continue to try to experiment with foods -Increase intake as tolerated -Try 1 can + water during the night  *Try some scented oil under your nose to help with nausea  Surgery date: 07/08/2015 Surgery type: RYGB [facility] weight at [facility]: 278 lbs on 05/24/2014 Weight today: 186.2 lbs Weight change: 4 lbs Total weight loss: 91 lbs  TANITA  BODY COMP RESULTS  07/01/15 07/23/15 10/31/15 02/07/16 03/16/16 04/20/16   BMI (kg/m^2) 40.4 38.6 33.4 30.4 29 28.3   Fat Mass (lbs) 138.5 131.0 103.2 84 80 74   Fat Free Mass (lbs) 127.5 122.8 116.4 116.2 110.6 112.2   Total Body Water (lbs) 93.5 90.4 84.4 83.4 79.2 80.2

## 2016-04-24 MED FILL — XULANE PATCH: 150-35 | 28 days supply | Qty: 3 | Fill #3

## 2016-05-01 ENCOUNTER — Telehealth: Payer: Self-pay

## 2016-05-01 MED FILL — SERTRALINE HCL 25 MG TABLET: 25 | 30 days supply | Qty: 30 | Fill #1

## 2016-05-01 NOTE — Telephone Encounter (Signed)
Pt changed her mind and does not need med refill right now

## 2016-05-01 NOTE — Telephone Encounter (Signed)
Pt wants a med refill please advise: [phone]

## 2016-05-04 DIAGNOSIS — L910 Hypertrophic scar: Secondary | ICD-10-CM | POA: Diagnosis not present

## 2016-05-12 DIAGNOSIS — Z0271 Encounter for disability determination: Secondary | ICD-10-CM

## 2016-05-13 DIAGNOSIS — Z01419 Encounter for gynecological examination (general) (routine) without abnormal findings: Secondary | ICD-10-CM | POA: Diagnosis not present

## 2016-05-13 DIAGNOSIS — Z6829 Body mass index (BMI) 29.0-29.9, adult: Secondary | ICD-10-CM | POA: Diagnosis not present

## 2016-05-13 MED FILL — FLUCONAZOLE 150 MG TABLET: 150 | 1 days supply | Qty: 1 | Fill #0

## 2016-05-13 MED FILL — VANDAZOLE VAGINAL 0.75% GEL: 0.75 | 5 days supply | Qty: 70 | Fill #0

## 2016-05-14 ENCOUNTER — Ambulatory Visit (HOSPITAL_COMMUNITY)
Admission: RE | Admit: 2016-05-14 | Discharge: 2016-05-14 | Disposition: A | Discharge status: Home / Self Care | Payer: 59 | Source: Ambulatory Visit | Attending: General Surgery | Admitting: General Surgery

## 2016-05-14 ENCOUNTER — Other Ambulatory Visit: Payer: Self-pay | Admitting: General Surgery

## 2016-05-14 ENCOUNTER — Encounter (HOSPITAL_COMMUNITY): Payer: Self-pay | Admitting: Interventional Radiology

## 2016-05-14 DIAGNOSIS — Z9884 Bariatric surgery status: Secondary | ICD-10-CM | POA: Insufficient documentation

## 2016-05-14 DIAGNOSIS — K9423 Gastrostomy malfunction: Secondary | ICD-10-CM | POA: Diagnosis not present

## 2016-05-14 DIAGNOSIS — Y733 Surgical instruments, materials and gastroenterology and urology devices (including sutures) associated with adverse incidents: Secondary | ICD-10-CM | POA: Diagnosis not present

## 2016-05-14 DIAGNOSIS — K912 Postsurgical malabsorption, not elsewhere classified: Secondary | ICD-10-CM

## 2016-05-14 HISTORY — PX: IR GENERIC HISTORICAL: IMG1180011

## 2016-05-14 MED ORDER — LIDOCAINE VISCOUS 2 % MT SOLN
OROMUCOSAL | Status: AC
  Filled 2016-05-14: qty 15

## 2016-05-14 MED FILL — NITROFURANTOIN MONO-MCR 100: 100 | 7 days supply | Qty: 14 | Fill #0

## 2016-05-16 MED FILL — XULANE PATCH: 150-35 | 28 days supply | Qty: 3 | Fill #4

## 2016-05-18 ENCOUNTER — Ambulatory Visit: Payer: 59 | Admitting: Neurology

## 2016-05-20 ENCOUNTER — Telehealth: Payer: Self-pay

## 2016-05-20 ENCOUNTER — Ambulatory Visit: Payer: 59 | Admitting: Neurology

## 2016-05-20 NOTE — Telephone Encounter (Signed)
Pt no-showed her follow-up appt this afternoon.

## 2016-05-21 DIAGNOSIS — Z9884 Bariatric surgery status: Secondary | ICD-10-CM | POA: Diagnosis not present

## 2016-05-21 DIAGNOSIS — K5909 Other constipation: Secondary | ICD-10-CM | POA: Diagnosis not present

## 2016-05-21 DIAGNOSIS — K912 Postsurgical malabsorption, not elsewhere classified: Secondary | ICD-10-CM | POA: Diagnosis not present

## 2016-05-21 DIAGNOSIS — E663 Overweight: Secondary | ICD-10-CM | POA: Diagnosis not present

## 2016-05-21 MED FILL — LINZESS 145 MCG CAPSULE: 145 | 30 days supply | Qty: 30 | Fill #0

## 2016-05-22 MED FILL — oxyCODONE HCL 5 MG TABS: 5 | 10 days supply | Qty: 20 | Fill #0

## 2016-05-22 MED FILL — RIZATRIPTAN 10 MG ODT: 10 | 30 days supply | Qty: 9 | Fill #1

## 2016-05-25 ENCOUNTER — Encounter: Payer: Self-pay | Admitting: Neurology

## 2016-05-25 ENCOUNTER — Encounter: Payer: Self-pay | Admitting: Family Medicine

## 2016-05-25 ENCOUNTER — Ambulatory Visit (INDEPENDENT_AMBULATORY_CARE_PROVIDER_SITE_OTHER): Payer: 59 | Admitting: Family Medicine

## 2016-05-25 ENCOUNTER — Ambulatory Visit (INDEPENDENT_AMBULATORY_CARE_PROVIDER_SITE_OTHER): Payer: 59 | Admitting: Neurology

## 2016-05-25 VITALS — BP 120/84 | HR 74 | Ht 68.0 in | Wt 187.8 lb

## 2016-05-25 VITALS — BP 130/81 | HR 73 | Temp 98.3°F | Resp 18 | Ht 68.0 in | Wt 186.0 lb

## 2016-05-25 DIAGNOSIS — K648 Other hemorrhoids: Secondary | ICD-10-CM | POA: Diagnosis not present

## 2016-05-25 DIAGNOSIS — H6981 Other specified disorders of Eustachian tube, right ear: Secondary | ICD-10-CM

## 2016-05-25 DIAGNOSIS — R413 Other amnesia: Secondary | ICD-10-CM | POA: Diagnosis not present

## 2016-05-25 DIAGNOSIS — G932 Benign intracranial hypertension: Secondary | ICD-10-CM | POA: Diagnosis not present

## 2016-05-25 DIAGNOSIS — G43009 Migraine without aura, not intractable, without status migrainosus: Secondary | ICD-10-CM

## 2016-05-25 MED ORDER — ELETRIPTAN HYDROBROMIDE 40 MG PO TABS: 40.0000 mg | ORAL_TABLET | Freq: Once | ORAL | 11 refills | Status: DC

## 2016-05-25 MED ORDER — CLONAZEPAM 0.5 MG PO TABS: 0.5000 mg | ORAL_TABLET | Freq: Two times a day (BID) | ORAL | 0 refills | Status: DC | PRN

## 2016-05-25 MED ORDER — HYDROCORTISONE ACETATE 25 MG RE SUPP: 25.0000 mg | Freq: Two times a day (BID) | RECTAL | 1 refills | Status: DC

## 2016-05-25 MED ORDER — CETIRIZINE HCL 10 MG PO TABS: 10.0000 mg | ORAL_TABLET | Freq: Every day | ORAL | 11 refills | Status: DC

## 2016-05-25 MED ORDER — FLUTICASONE PROPIONATE 50 MCG/ACT NA SUSP: 2.0000 | Freq: Every day | NASAL | 2 refills | Status: DC

## 2016-05-25 MED ORDER — IPRATROPIUM BROMIDE 0.03 % NA SOLN: 2.0000 | Freq: Four times a day (QID) | NASAL | 1 refills | Status: DC

## 2016-05-25 MED ORDER — AMITRIPTYLINE HCL 10 MG PO TABS: 10.0000 mg | ORAL_TABLET | Freq: Every day | ORAL | 11 refills | Status: DC

## 2016-05-25 MED ORDER — TOPIRAMATE 50 MG PO TABS: 50.0000 mg | ORAL_TABLET | Freq: Every day | ORAL | 11 refills | Status: DC

## 2016-05-25 MED FILL — FLUTICASONE PROP 50 MCG SPR: 50 | 30 days supply | Qty: 16 | Fill #0

## 2016-05-25 MED FILL — IPRATROPIUM 0.03% SPRAY: 0.03 | 30 days supply | Qty: 30 | Fill #0

## 2016-05-25 MED FILL — clonazePAM 0.5 MG TABS: 0.5 | 15 days supply | Qty: 30 | Fill #0

## 2016-05-25 MED FILL — AMITRIPTYLINE HCL 10 MG TAB: 10 | 30 days supply | Qty: 30 | Fill #0

## 2016-05-25 MED FILL — ANUSOL-HC 25 MG SUPPOSITORY: 25 | 12 days supply | Qty: 24 | Fill #0

## 2016-05-25 MED FILL — TOPIRAMATE 50 MG TABLET: 50 | 30 days supply | Qty: 30 | Fill #0

## 2016-05-25 NOTE — Patient Instructions (Addendum)
Remember to drink plenty of fluid, eat healthy meals and do not skip any meals. Try to eat protein with a every meal and eat a healthy snack such as fruit or nuts in between meals. Try to keep a regular sleep-wake schedule and try to exercise daily, particularly in the form of walking, 20-30 minutes a day, if you can.   As far as your medications are concerned, I would like to suggest: Take Amitriptyline for 2 weeks at bedtime as discussed. Then decrease Topiramate.  I would like to see you back in 4 months, sooner if we need to. Please call us with any interim questions, concerns, problems, updates or refill requests.   Our phone number is [phone]. We also have an after hours call service for urgent matters and there is a physician on-call for urgent questions. For any emergencies you know to call 911 or go to the nearest emergency room  Amitriptyline tablets What is this medicine? AMITRIPTYLINE (a mee TRIP ti leen) is used to treat depression. This medicine may be used for other purposes; ask your health care provider or pharmacist if you have questions. COMMON BRAND NAME(S): Elavil, Vanatrip What should I tell my health care provider before I take this medicine? They need to know if you have any of these conditions: -an alcohol problem -asthma, difficulty breathing -bipolar disorder or schizophrenia -difficulty passing urine, prostate trouble -glaucoma -heart disease or previous heart attack -liver disease -over active thyroid -seizures -thoughts or plans of suicide, a previous suicide attempt, or family history of suicide attempt -an unusual or allergic reaction to amitriptyline, other medicines, foods, dyes, or preservatives -pregnant or trying to get pregnant -breast-feeding How should I use this medicine? Take this medicine by mouth with a drink of water. Follow the directions on the prescription label. You can take the tablets with or without food. Take your medicine at  regular intervals. Do not take it more often than directed. Do not stop taking this medicine suddenly except upon the advice of your doctor. Stopping this medicine too quickly may cause serious side effects or your condition may worsen. A special MedGuide will be given to you by the pharmacist with each prescription and refill. Be sure to read this information carefully each time. Talk to your pediatrician regarding the use of this medicine in children. Special care may be needed. Overdosage: If you think you have taken too much of this medicine contact a poison control center or emergency room at once. NOTE: This medicine is only for you. Do not share this medicine with others. What if I miss a dose? If you miss a dose, take it as soon as you can. If it is almost time for your next dose, take only that dose. Do not take double or extra doses. What may interact with this medicine? Do not take this medicine with any of the following medications: -arsenic trioxide -certain medicines used to regulate abnormal heartbeat or to treat other heart conditions -cisapride -droperidol -halofantrine -linezolid -MAOIs like Carbex, Eldepryl, Marplan, Nardil, and Parnate -methylene blue -other medicines for mental depression -phenothiazines like perphenazine, thioridazine and chlorpromazine -pimozide -probucol -procarbazine -sparfloxacin -St. John's Wort -ziprasidone This medicine may also interact with the following medications: -atropine and related drugs like hyoscyamine, scopolamine, tolterodine and others -barbiturate medicines for inducing sleep or treating seizures, like phenobarbital -cimetidine -disulfiram -ethchlorvynol -thyroid hormones such as levothyroxine This list may not describe all possible interactions. Give your health care provider a list of all the medicines,  herbs, non-prescription drugs, or dietary supplements you use. Also tell them if you smoke, drink alcohol, or use illegal  drugs. Some items may interact with your medicine. What should I watch for while using this medicine? Tell your doctor if your symptoms do not get better or if they get worse. Visit your doctor or health care professional for regular checks on your progress. Because it may take several weeks to see the full effects of this medicine, it is important to continue your treatment as prescribed by your doctor. Patients and their families should watch out for new or worsening thoughts of suicide or depression. Also watch out for sudden changes in feelings such as feeling anxious, agitated, panicky, irritable, hostile, aggressive, impulsive, severely restless, overly excited and hyperactive, or not being able to sleep. If this happens, especially at the beginning of treatment or after a change in dose, call your health care professional. Dennis Bast may get drowsy or dizzy. Do not drive, use machinery, or do anything that needs mental alertness until you know how this medicine affects you. Do not stand or sit up quickly, especially if you are an older patient. This reduces the risk of dizzy or fainting spells. Alcohol may interfere with the effect of this medicine. Avoid alcoholic drinks. Do not treat yourself for coughs, colds, or allergies without asking your doctor or health care professional for advice. Some ingredients can increase possible side effects. Your mouth may get dry. Chewing sugarless gum or sucking hard candy, and drinking plenty of water will help. Contact your doctor if the problem does not go away or is severe. This medicine may cause dry eyes and blurred vision. If you wear contact lenses you may feel some discomfort. Lubricating drops may help. See your eye doctor if the problem does not go away or is severe. This medicine can cause constipation. Try to have a bowel movement at least every 2 to 3 days. If you do not have a bowel movement for 3 days, call your doctor or health care professional. This  medicine can make you more sensitive to the sun. Keep out of the sun. If you cannot avoid being in the sun, wear protective clothing and use sunscreen. Do not use sun lamps or tanning beds/booths. What side effects may I notice from receiving this medicine? Side effects that you should report to your doctor or health care professional as soon as possible: -allergic reactions like skin rash, itching or hives, swelling of the face, lips, or tongue -anxious -breathing problems -changes in vision -confusion -elevated mood, decreased need for sleep, racing thoughts, impulsive behavior -eye pain -fast, irregular heartbeat -feeling faint or lightheaded, falls -feeling agitated, angry, or irritable -fever with increased sweating -hallucination, loss of contact with reality -seizures -stiff muscles -suicidal thoughts or other mood changes -tingling, pain, or numbness in the feet or hands -trouble passing urine or change in the amount of urine -trouble sleeping -unusually weak or tired -vomiting -yellowing of the eyes or skin Side effects that usually do not require medical attention (report to your doctor or health care professional if they continue or are bothersome): -change in sex drive or performance -change in appetite or weight -constipation -dizziness -dry mouth -nausea -tired -tremors -upset stomach This list may not describe all possible side effects. Call your doctor for medical advice about side effects. You may report side effects to FDA at 1-800-FDA-1088. Where should I keep my medicine? Keep out of the reach of children. Store at room temperature between  20 and 25 degrees C (68 and 77 degrees F). Throw away any unused medicine after the expiration date. NOTE: This sheet is a summary. It may not cover all possible information. If you have questions about this medicine, talk to your doctor, pharmacist, or health care provider.  2018 Elsevier/Gold Standard (2015-07-26  12:14:15)  Eletriptan tablets What is this medicine? ELETRIPTAN (el ih TRIP tan) is used to treat migraines with or without aura. An aura is a strange feeling or visual disturbance that warns you of an attack. It is not used to prevent migraines. This medicine may be used for other purposes; ask your health care provider or pharmacist if you have questions. COMMON BRAND NAME(S): Relpax What should I tell my health care provider before I take this medicine? They need to know if you have any of these conditions: -bowel disease or colitis -diabetes -family history of heart disease -fast or irregular heart beat -heart or blood vessel disease, angina (chest pain), or previous heart attack -high blood pressure -high cholesterol -history of stroke, transient ischemic attacks (TIAs or mini-strokes), or intracranial bleeding -kidney or liver disease -overweight -poor circulation -postmenopausal or surgical removal of uterus and ovaries -Raynaud's disease -seizure disorder -an unusual or allergic reaction to eletriptan, other medicines, foods, dyes, or preservatives -pregnant or trying to get pregnant -breast-feeding How should I use this medicine? Take this medicine by mouth with a glass of water. Follow the directions on the prescription label. This medicine is taken at the first symptoms of a migraine. It is not for everyday use. If your migraine headache returns after one dose, you can take another dose as directed. You must leave at least 2 hours between doses, and do not take more than 40 mg as a single dose. Do not take more than 80 mg total in any 24 hour period. If there is no improvement at all after the first dose, do not take a second dose without talking to your doctor or health care professional. Do not take your medicine more often than directed. Talk to your pediatrician regarding the use of this medicine in children. Special care may be needed. Overdosage: If you think you have  taken too much of this medicine contact a poison control center or emergency room at once. NOTE: This medicine is only for you. Do not share this medicine with others. What if I miss a dose? This does not apply; this medicine is not for regular use. What may interact with this medicine? Do not take this medicine with any of the following medications: -antiviral medicines for HIV or AIDS -certain antibiotics like clarithromycin, erythromycin, telithromycin -cimetidine -conivaptan -dalfopristin; quinupristin -diltiazem -ergot alkaloids like dihydroergotamine, ergonovine, ergotamine, methylergonovine -fluvoxamine -idelalisib -imatinib -medicines for fungal infections like fluconazole, itraconazole, ketoconazole, and voriconazole -mifepristone -nefazodone -stimulant medicines for attention disorders, weight loss, or to stay awake -verapamil -zafirlukast This medicine may also interact with the following medications: -certain medicines for depression, anxiety, or psychotic disturbances This list may not describe all possible interactions. Give your health care provider a list of all the medicines, herbs, non-prescription drugs, or dietary supplements you use. Also tell them if you smoke, drink alcohol, or use illegal drugs. Some items may interact with your medicine. What should I watch for while using this medicine? Only take this medicine for a migraine headache. Take it if you get warning symptoms or at the start of a migraine attack. It is not for regular use to prevent migraine attacks. You may get  drowsy or dizzy. Do not drive, use machinery, or do anything that needs mental alertness until you know how this medicine affects you. To reduce dizzy or fainting spells, do not sit or stand up quickly, especially if you are an older patient. Alcohol can increase drowsiness, dizziness and flushing. Avoid alcoholic drinks. Smoking cigarettes may increase the risk of heart-related side effects  from using this medicine. If you take migraine medicines for 10 or more days a month, your migraines may get worse. Keep a diary of headache days and medicine use. Contact your healthcare professional if your migraine attacks occur more frequently. What side effects may I notice from receiving this medicine? Side effects that you should report to your doctor or health care professional as soon as possible: -allergic reactions like skin rash, itching or hives, swelling of the face, lips, or tongue -fast, slow, or irregular heart beat -increased or decreased blood pressure -seizures -severe stomach pain and cramping, bloody diarrhea -signs and symptoms of a blood clot such as breathing problems; changes in vision; chest pain; severe, sudden headache; pain, swelling, warmth in the leg; trouble speaking; sudden numbness or weakness of the face, arm or leg -tingling, pain, or numbness in the face, hands, or feet Side effects that usually do not require medical attention (report to your doctor or health care professional if they continue or are bothersome): -drowsiness -feeling warm, flushing, or redness of the face -headache -muscle cramps, pain -nausea, vomiting -unusually weak or tired This list may not describe all possible side effects. Call your doctor for medical advice about side effects. You may report side effects to FDA at 1-800-FDA-1088. Where should I keep my medicine? Keep out of the reach of children. Store at room temperature between 15 and 30 degrees C (59 and 86 degrees F). Throw away any unused medicine after the expiration date. NOTE: This sheet is a summary. It may not cover all possible information. If you have questions about this medicine, talk to your doctor, pharmacist, or health care provider.  2018 Elsevier/Gold Standard (2015-02-21 11:20:42)

## 2016-05-25 NOTE — Patient Instructions (Addendum)
Please have Dr. Redmond Pulling send me a copy of your labs.  I recommend that you have the internal hemorrhoid treated since you have failed conservative management with stool softeners and cortisone suppositories.  Please talk about this with Dr. Loura Back - he likely would be able to do this. If not, let me know and I will be happy to put a GI referral in for you.  It may be worth considering having more detailed hormonal labs and treatment due to the severity of sx - [facility] physicians often do this - [place] or Dr. Sharlett Iles at [facility] both do this as well as other clinics.    IF you received an x-ray today, you will receive an invoice from [facility]. Please contact [facility] at [phone] with questions or concerns regarding your invoice.   IF you received labwork today, you will receive an invoice from [facility]. Please contact [facility] at [phone] with questions or concerns regarding your invoice.   Our billing staff will not be able to assist you with questions regarding bills from these companies.  You will be contacted with the lab results as soon as they are available. The fastest way to get your results is to activate your My Chart account. Instructions are located on the last page of this paperwork. If you have not heard from [name] regarding the results in 2 weeks, please contact this office.     Generalized Anxiety Disorder, Adult Generalized anxiety disorder (GAD) is a mental health disorder. People with this condition constantly worry about everyday events. Unlike normal anxiety, worry related to GAD is not triggered by a specific event. These worries also do not fade or get better with time. GAD interferes with life functions, including relationships, work, and school. GAD can vary from mild to severe. People with severe GAD can have intense waves of anxiety with physical symptoms (panic attacks). What are the  causes? The exact cause of GAD is not known. What increases the risk? This condition is more likely to develop in:  Women.  People who have a family history of anxiety disorders.  People who are very shy.  People who experience very stressful life events, such as the death of a loved one.  People who have a very stressful family environment. What are the signs or symptoms? People with GAD often worry excessively about many things in their lives, such as their health and family. They may also be overly concerned about:  Doing well at work.  Being on time.  Natural disasters.  Friendships. Physical symptoms of GAD include:  Fatigue.  Muscle tension or having muscle twitches.  Trembling or feeling shaky.  Being easily startled.  Feeling like your heart is pounding or racing.  Feeling out of breath or like you cannot take a deep breath.  Having trouble falling asleep or staying asleep.  Sweating.  Nausea, diarrhea, or irritable bowel syndrome (IBS).  Headaches.  Trouble concentrating or remembering facts.  Restlessness.  Irritability. How is this diagnosed? Your health care provider can diagnose GAD based on your symptoms and medical history. You will also have a physical exam. The health care provider will ask specific questions about your symptoms, including how severe they are, when they started, and if they come and go. Your health care provider may ask you about your use of alcohol or drugs, including prescription medicines. Your health care provider may refer you to a mental health specialist for further evaluation. Your health  care provider will do a thorough examination and may perform additional tests to rule out other possible causes of your symptoms. To be diagnosed with GAD, a person must have anxiety that:  Is out of his or her control.  Affects several different aspects of his or her life, such as work and relationships.  Causes distress that  makes him or her unable to take part in normal activities.  Includes at least three physical symptoms of GAD, such as restlessness, fatigue, trouble concentrating, irritability, muscle tension, or sleep problems. Before your health care provider can confirm a diagnosis of GAD, these symptoms must be present more days than they are not, and they must last for six months or longer. How is this treated? The following therapies are usually used to treat GAD:  Medicine. Antidepressant medicine is usually prescribed for long-term daily control. Antianxiety medicines may be added in severe cases, especially when panic attacks occur.  Talk therapy (psychotherapy). Certain types of talk therapy can be helpful in treating GAD by providing support, education, and guidance. Options include:  Cognitive behavioral therapy (CBT). People learn coping skills and techniques to ease their anxiety. They learn to identify unrealistic or negative thoughts and behaviors and to replace them with positive ones.  Acceptance and commitment therapy (ACT). This treatment teaches people how to be mindful as a way to cope with unwanted thoughts and feelings.  Biofeedback. This process trains you to manage your body's response (physiological response) through breathing techniques and relaxation methods. You will work with a therapist while machines are used to monitor your physical symptoms.  Stress management techniques. These include yoga, meditation, and exercise. A mental health specialist can help determine which treatment is best for you. Some people see improvement with one type of therapy. However, other people require a combination of therapies. Follow these instructions at home:  Take over-the-counter and prescription medicines only as told by your health care provider.  Try to maintain a normal routine.  Try to anticipate stressful situations and allow extra time to manage them.  Practice any stress management  or self-calming techniques as taught by your health care provider.  Do not punish yourself for setbacks or for not making progress.  Try to recognize your accomplishments, even if they are small.  Keep all follow-up visits as told by your health care provider. This is important. Contact a health care provider if:  Your symptoms do not get better.  Your symptoms get worse.  You have signs of depression, such as:  A persistently sad, cranky, or irritable mood.  Loss of enjoyment in activities that used to bring you joy.  Change in weight or eating.  Changes in sleeping habits.  Avoiding friends or family members.  Loss of energy for normal tasks.  Feelings of guilt or worthlessness. Get help right away if:  You have serious thoughts about hurting yourself or others. If you ever feel like you may hurt yourself or others, or have thoughts about taking your own life, get help right away. You can go to your nearest emergency department or call:  Your local emergency services (911 in the [place]  A suicide crisis helpline, such as the [facility] at 5815445157. This is open 24 hours a day. Summary  Generalized anxiety disorder (GAD) is a mental health disorder that involves worry that is not triggered by a specific event.  People with GAD often worry excessively about many things in their lives, such as their health and  family.  GAD may cause physical symptoms such as restlessness, trouble concentrating, sleep problems, frequent sweating, nausea, diarrhea, headaches, and trembling or muscle twitching.  A mental health specialist can help determine which treatment is best for you. Some people see improvement with one type of therapy. However, other people require a combination of therapies. This information is not intended to replace advice given to you by your health care provider. Make sure you discuss any questions you have with your health care  provider. Document Released: 06/20/2012 Document Revised: 01/14/2016 Document Reviewed: 01/14/2016 Elsevier Interactive Patient Education  2017 [employer]  1. Use "WARM" strategy.  W= write it down  A= associate it  R= repeat it  M= make a mental note  2.   You can keep a Social worker.  Use a 3-ring notebook with sections for the following: calendar, important names and phone numbers,  medications, doctors' names/phone numbers, lists/reminders, and a section to journal what you did  each day.   3.    Use a calendar to write appointments down.  4.    Write yourself a schedule for the day.  This can be placed on the calendar or in a separate section of the Memory Notebook.  Keeping a  regular schedule can help memory.  5.    Use medication organizer with sections for each day or morning/evening pills.  You may need help loading it  6.    Keep a basket, or pegboard by the door.  Place items that you need to take out with you in the basket or on the pegboard.  You may also want to  include a message board for reminders.  7.    Use sticky notes.  Place sticky notes with reminders in a place where the task is performed.  For example: " turn off the  stove" placed by the stove, "lock the door" placed on the door at eye level, " take your medications" on  the bathroom mirror or by the place where you normally take your medications.  8.    Use alarms/timers.  Use while cooking to remind yourself to check on food or as a reminder to take your medicine, or as a  reminder to make a call, or as a reminder to perform another task, etc.

## 2016-05-25 NOTE — Progress Notes (Signed)
[facility]    Provider:  Dr Jaynee Eagles Referring Provider: Shawnee Knapp, MD Primary Care Physician:  [name], MD  CC: Headaches  Interval history 05/25/2016: she still has a feeding tube in. Nothing tastes like food. She feels full. She has lost more than 100 pounds. She has some ear pain on the right. She has difficulty eating, satiety and abdominal pain. She has some racing thoughts. She has some memory issues. She is on 100mg  Topiramate at night. In May 2017 she reportedly had daily headaches and currently she has 1-2 days a week. She has 10 headache days a month and 3 are migraines. She missed her appt with dr. Katy Fitch.   Interval history 12/03/2015: She has lost 50 pounds. She is improving. She still has a peg tube. She has a lot of nausea when she eats and it is painful. It is improving. Her headaches are improved. She has had a UTI the last few weeks but previous to that her headaches were reduced to 3-5 a month but the last time she had a migraine was in August and the maxalt resolved it. She is having memory changes from the Topamax. She is having tingling. We can reduce the topiramate to 100mg  at night. Go to 150mg  for a week and then 100mg  at night. She has an appt with Midge Aver in ophtho in October.   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 She had worsening headaches recently. She takes imitrex acutely. Not sure if worsening headache due to recent stress, surgey complications, IIH or her migraines. Her IIH has been mostly under control, visual fields have been good, fundoscopic exam has been good. She is on Topamax 200mg  daily. She uses imitrex. She had a bad migraine last week. She has had 2 migraines in the last month, in between she has been ok. The propranolol has helped. We can increase the propranolol. The last migraines have been with ear pain. Encourage her to take the imitrex at the onset of the headache with zofran. Will  change imitrex to nasal spray. Imitrex not working as well since the roux-en-Y. Discussed in detail.   Interval history 07/22/2015: She had the roux en y gastric bypass. She is having significant postoperative complications and she is crying in the office today. Her headaches have increased. The headaches are daily. The headaches are more behind the right eye with throbbing and light sensitivity, they sound more migrainous today than pressure/IIH. The topamax doesn't help like it used to and she feels as though it is contributing to her significant memory changes and depression. She has pressure around the ears as well. Her ears will start hurting. More intense. Memory is terrible. Last visit with Dr Katy Fitch was all normal, visual fields normal. She is on Topamax 150mg  twice daily. She is feeling more depressed. She was feeling better on the Topamax but since the surgery the headaches are worse and memory worse and depression.   Interval update 06/03/2015: Patient with obesity and intracranial idiopathic hypertension here for follow up.Repeat MRI consistent with IIH (There is flattening of the pituitary gland within a mildly enlarged sella turcica and widening of the optic nerves sheaths) and elevated opening pressure on LP(23cm). She is currently On Topamax due to Sulfa allergy and can't take diamox. Discussed weight loss as integral to the management of her condition that can cause intractable headaches and permanent blindness.She is taking Topamax 200mg  only at night. She feels tired.  She has some pins and needles in the arms and feet. She has headaches in the morning. She had a sleep study at [facility] within the last year that did not show OSA. She does not smoke. She is having smelling problems. She has throbbing headaches. She has vision changes. She has blurry vision. Her husband had a vascectomy and she had cervical abalation and she says she cannot have children at this time. Advised to be careful  and use backup due to teratogenicity of topamax. Discussed weight loss as the most important way to help with this disorder. If vision or headaches significantly worsen go directly to the ED.   Patient has a sulfa allergy cannot use diamox. Will increase Topamax to 200mg  and then may have to further increase as this is a weaker medication than acetazolamide. Will refer to Dr. Midge Aver for evaluation.   HPI: Margaret Fletcher is a 46 y.o. female here as a referral from Dr. Marin Comment for headaches. PMHx morbid obesity, migraines, depression. She has had headaches since [facility], no inciting events or trauma. In the last several years they are unilateral, she gets unilateral ptosis, she has throbbing, light and noise sensitivity, nausea, dizziness, vertigo. She have worsening anosmia and food smells different, taste is affected. Worsening headaches. She has headache almost every day and maybe 4-5 a month that are severe and migrainous. Worsening over the last year. She takes Excedrin migraine almost daily. She gets unilateral vision changes, blurriness with the headaches. Worst headache can last 12 hours, 10/10, no vomiting. She has had to leave work early. She has right ear hearing changes. She had a sleep study completed at [facility] last year.   Reviewed notes, labs and imaging from outside physicians, which showed:  MRI of the brain 06/2011: Personally reviewed and agree with the following IMPRESSION: No acute intracranial findings.  No visible white matter disease, specifically no evidence for complicated migraine.  Abnormally increased amount of cerebrospinal fluid in the sella turcica with flattening of the pituitary gland against the floor.This appearance is consistent with a diagnosis of empty sella, although the relationship of this abnormality to the patient's clinical symptomatology is not established     Social History   Social History  . Marital status: Divorced    Spouse  name: N/A  . Number of children: 2  . Years of education: 16   Occupational History  . [employer]- nurse tech    Social History Main Topics  . Smoking status: Never Smoker  . Smokeless tobacco: Never Used  . Alcohol use Yes     Comment: socially  . Drug use: No  . Sexual activity: Yes    Birth control/ protection: Surgical   Other Topics Concern  . Not on file   Social History Narrative   Lives with partner   Caffeine use: minimal coffee   Right-handed    Family History  Problem Relation Age of Onset  . Cancer Mother   . Stroke Father   . Diabetes Brother   . Seizures Son   . Migraines Neg Hx     Past Medical History:  Diagnosis Date  . Alopecia   . Anxiety   . Autoimmune disease ([place])    "Alopecia"  . Bacterial vaginosis   . Cyst of spleen   . Depression   . Depression   . Frequent UTI   . Hypertrophy of breast   . IIH (idiopathic intracranial hypertension) 2017   "related to  headaches"  . Migraine   . Migraine headache    denies, ruled out - Propranolol.Using for headaches    Past Surgical History:  Procedure Laterality Date  . APPENDECTOMY    . BREAST REDUCTION SURGERY Bilateral 03/26/2015   Procedure: BILATERAL BREAST REDUCTION  ;  Surgeon: Youlanda Roys, MD;  Location: [facility];  Service: Plastics;  Laterality: Bilateral;  . BREATH TEK H PYLORI N/A 05/21/2014   Procedure: BREATH TEK H PYLORI;  Surgeon: Greer Pickerel, MD;  Location: [facility];  Service: General;  Laterality: N/A;  . CESAREAN SECTION    . CESAREAN SECTION    . CHOLECYSTECTOMY    . DILITATION & CURRETTAGE/HYSTROSCOPY WITH NOVASURE ABLATION N/A 07/19/2012   Procedure: HYSTEROSCOPY WITH NOVASURE ABLATION;  Surgeon: Farrel Gobble. Harrington Challenger, MD;  Location: [facility];  Service: Gynecology;  Laterality: N/A;  . ENDOMETRIAL ABLATION    . ESOPHAGOGASTRODUODENOSCOPY N/A 08/16/2015   Procedure: UPPER ESOPHAGOGASTRODUODENOSCOPY (EGD);  Surgeon: Greer Pickerel, MD;  Location: [facility];  Service: General;  Laterality: N/A;  . ESOPHAGOGASTRODUODENOSCOPY N/A 02/21/2016   Procedure: ESOPHAGOGASTRODUODENOSCOPY (EGD);  Surgeon: Greer Pickerel, MD;  Location: [facility];  Service: General;  Laterality: N/A;  . IR GENERIC HISTORICAL  10/04/2015   IR [place] Vivianne Master 10/04/2015 Markus Daft, MD [facility]  . IR GENERIC HISTORICAL  01/31/2016   IR [place] GASTRO/COLONIC TUBE PERCUT W/FLUORO 01/31/2016 Corrie Mckusick, DO [facility]  . IR GENERIC HISTORICAL  04/06/2016   IR [place] TUBE PERCUT W/FLUORO 04/06/2016 Arne Cleveland, MD [facility]  . IR GENERIC HISTORICAL  05/14/2016   IR GASTRIC TUBE PERC CHG W/O IMG GUIDE 05/14/2016 Sandi Mariscal, MD [facility]  . LAPAROSCOPIC GASTROSTOMY N/A 09/30/2015   Procedure: LAPAROSCOPIC GASTROSTOMY TUBE PLACEMENT;  Surgeon: Greer Pickerel, MD;  Location: [facility];  Service: General;  Laterality: N/A;  . LAPAROSCOPIC ROUX-EN-Y GASTRIC BYPASS WITH HIATAL HERNIA REPAIR N/A 07/08/2015   Procedure: LAPAROSCOPIC ROUX-EN-Y GASTRIC BYPASS WITH HIATAL HERNIA REPAIR WITH UPPER ENDOSCOPY;  Surgeon: Greer Pickerel, MD;  Location: [facility];  Service: General;  Laterality: N/A;  . LAPAROSCOPIC SMALL BOWEL RESECTION N/A 09/30/2015   Procedure: LAPAROSCOPIC REVISION OF ROUX LIMB;  Surgeon: Greer Pickerel, MD;  Location: [facility];  Service: General;  Laterality: N/A;  . LAPAROSCOPY N/A 09/30/2015   Procedure: LAPAROSCOPY DIAGNOSTIC;  Surgeon: Greer Pickerel, MD;  Location: [facility];  Service: General;  Laterality: N/A;  . TUBAL LIGATION    . WISDOM TOOTH EXTRACTION      Current Outpatient Prescriptions  Medication Sig Dispense Refill  . ALPRAZolam (XANAX) 0.5 MG tablet Take 1 tablet (0.5 mg total) by mouth 2 (two) times daily as needed for anxiety. 30 tablet 1  . CALCIUM-VITAMIN D PO Take 15 mLs by mouth 3 (three) times daily.    . Cyanocobalamin (VITAMIN B-12) 1000 MCG/15ML LIQD Take 5 mLs by mouth daily.    Marland Kitchen docusate sodium (COLACE) 100 MG  capsule Take 100 mg by mouth daily.    Marland Kitchen lactulose (CHRONULAC) 10 GM/15ML solution Take 30 g by mouth daily as needed for moderate constipation.   0  . linaclotide (LINZESS) 145 MCG CAPS capsule Take 1 capsule (145 mcg total) by mouth daily before breakfast. 30 capsule 0  . Multiple Vitamin (MULTIVITAMIN) LIQD Take 5 mLs by mouth 2 (two) times daily.    . Nutritional Supplements (FEEDING SUPPLEMENT, VITAL HIGH PROTEIN,) LIQD liquid Place 1,000 mLs into feeding tube daily. 12ml/hr for 8hrs a day as needed  for poor oral intake; 300 ml free water q4hr (Patient taking differently: Place 1,000 mLs into feeding tube daily. 75ml/hr for 8hrs a day as needed for poor oral intake; 300 ml free water q4hr) 1000 mL 8  . ondansetron (ZOFRAN-ODT) 4 MG disintegrating tablet Take 1 tablet (4 mg total) by mouth every 6 (six) hours as needed for nausea. 30 tablet 0  . oxyCODONE (OXY IR/ROXICODONE) 5 MG immediate release tablet Take 1 tablet (5 mg total) by mouth every 6 (six) hours as needed for moderate pain. 10 tablet 0  . oxyCODONE (ROXICODONE) 5 MG/5ML solution Take 2.5-5 mLs by mouth every 6 (six) hours as needed for pain.  0  . pantoprazole (PROTONIX) 40 MG tablet Take 1 tablet (40 mg total) by mouth daily. 30 tablet 1  . polyethylene glycol (MIRALAX / GLYCOLAX) packet Take 17 g by mouth daily.    . sertraline (ZOLOFT) 25 MG tablet Take 1 tablet (25 mg total) by mouth daily. 30 tablet 2  . simethicone (MYLICON) 80 MG chewable tablet Chew 80 mg by mouth every 6 (six) hours as needed for flatulence.    . sucralfate (CARAFATE) 1 GM/10ML suspension Take 1 g by mouth 4 (four) times daily.    [name] 150-35 MCG/24HR transdermal patch Place 2 patches onto the skin once a week.  11  . amitriptyline (ELAVIL) 10 MG tablet Take 1 tablet (10 mg total) by mouth at bedtime. 30 tablet 11  . eletriptan (RELPAX) 40 MG tablet Take 1 tablet (40 mg total) by mouth once. Repeat in 2 hours if headache persists or recurs. Max twice  daily. 10 tablet 11  . topiramate (TOPAMAX) 50 MG tablet Take 1 tablet (50 mg total) by mouth daily. 30 tablet 11   No current facility-administered medications for this visit.     Allergies as of 05/25/2016 - Review Complete 05/25/2016  Allergen Reaction Noted  . Sulfa antibiotics Anaphylaxis 01/18/2011  . Zosyn [piperacillin sod-tazobactam so] Rash 09/12/2015    Vitals: BP 120/84   Pulse 74   Ht 5\' 8"  (1.727 m)   Wt 187 lb 12.8 oz (85.2 kg)   BMI 28.55 kg/m  Last Weight:  Wt Readings from Last 1 Encounters:  05/25/16 187 lb 12.8 oz (85.2 kg)   Last Height:   Ht Readings from Last 1 Encounters:  05/25/16 5\' 8"  (1.727 m)       Speech:  Speech is normal; fluent and spontaneous with normal comprehension.  Cognition:  The patient is oriented to person, place, and time;   recent and remote memory intact;   language fluent;   normal attention, concentration,   fund of knowledge Cranial Nerves:  The pupils are equal, round, and reactive to light. The fundi are normal and spontaneous venous pulsations are present. Visual fields are full to finger confrontation. Extraocular movements are intact. Trigeminal sensation is intact and the muscles of mastication are normal. The face is symmetric. The palate elevates in the midline. Hearing intact. Voice is normal. Shoulder shrug is normal. The tongue has normal motion without fasciculations.   Coordination:  Normal finger to nose and heel to shin. Normal rapid alternating movements.   Gait:  Heel-toe and tandem gait are normal.   Motor Observation:  No asymmetry, no atrophy, and no involuntary movements noted. Tone:  Normal muscle tone.   Posture:  Posture is normal. normal erect   Strength:  Strength is V/V in the upper and lower limbs.    Sensation: intact to  LT   Reflex Exam:  DTR's:  Deep tendon reflexes in the upper and lower extremities are normal bilaterally.   Toes:  The toes are downgoing bilaterally.  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 Repeat MRI consistent with IIH. She also has a history of migraines. She is allergic to sulfa drugs and therefore cannot use Diamox. S/p roux-en-y   Decrease  topamax to 50mg  at night due to worsening fatigue, memory problems, depression (all of which can be caused by Topamax)- and watch for headache progression.   Some Medication Overuse with Excedrin daily. Advised no more than 2-3x a week.  Relpax at the onset. May take with zofran or [place] Start Amitriptyline at night See Dr. Katy Fitch in 1-2 months   Lumbar puncture - opening pressure 23 MRi of the brain w/wo contrast - c/w IIH Topiramate - decreased to 200 mg at last appointment daily at bedtime given worsening memory and depression (funduscopic exam and visual fields have been normal) She has an anaphylactic allergy to sulfa, cannot use diamox. Follow up with Dr. Katy Fitch Discussed [place] and risk of permanent vision loss, proceed to ED directly if vision changes  Sarina Ill, MD [facility] [address] [place], [place] [address]  Phone [phone] Fax [phone]  A total of 25 minutes was spent face-to-face with this patient. Over half this time was spent on counseling patient on the IIH and migraine diagnosis and different diagnostic and therapeutic options available.

## 2016-05-25 NOTE — Progress Notes (Signed)
Subjective:    Patient ID: Margaret Fletcher, female    DOB: [birth year], 46 y.o.   MRN: [id number]   Chief Complaint  Patient presents with  . Tinnitus    R ear. Since Thursday. Tender to touch underneath jawline.   . Hemorrhoids    Mass that is painful.    HPI 4d ago when she tried to bite into something she would get an increedible pain/cramping sensation.  Rt > Lt - they get sore and it helps when she rubs in there parotid area and she is noting lymph nodes tenderness on rher right anterior.  She has used a few more mibraines so tried her migraine medication. No f/c. + headaches - she saw her neurologist this morning - can't smell, altered taste so poor intake.   Can't think, can't concentrate, racing thoughts, can't put things together and so she starts rocking so she doesn't have to get up to move around - other people have noticed it.  She can't think of words or numbers - sometimes she can't remember her email address and has poor retention when reading. For a long time. She was worried she had alzheimer's but dont know about ADD. Dr. Jaynee Eagles didn't think it was due to [place]. Has labs checked by Dr. Redmond Pulling and so doesn't think she has any dificiences. SHe is due for repeat labs in the next day or so. She is on zoloft for anxiety but it seemws to poss be making her anxiety worse but very difficult to tell - some days are better than others.  In the first several weeks she was more jittery and then those sxs went away.  Has really noticed it more this in the past year. SHe had an endometrial ablation but does have periods since rapid weight loss and she was prescribed the patch which has helped a ton but she had to off of it for a week because of insurance wouldn't cover 30d - only 28d/mo and that made her feel very fatigued. Now restarted but is making hair grow, is causing more tearfulness.  Son is diagnosed with ADD - he was diagnosed in his elementary and is now 47 yo so doesn't take any  medication.  PGM has alzheimers.   Not sleeping well as she doesn't feel like she can't slow down enough to sleep. Wonders if she is to anxious to sleep. No caffeine. Drinkins water, rare juice. Things dont taste good - only drinks 1 1/2 bottle. Is used the xanax 0.25 (1/2 tabs) 4 times over the past several weeks.  She is newly going to start on elavil.  She feels tired but she can't stop moving,  Jiggles her right leg until it cramps. Jiggling helps still her mind  Remove an urge.  When she takes a xanax it makes her relaxed and sleepy.  But she likes the calmness which is what she gets from the movement - to take a deep breath and not feel so tense. She wants to slow down her throat process and be able to focus on one thing Stomach is jittery Has also been on wellbutrin prior Prior to diagnosis with ICH - she take a lot of Excedrin migraine. She would drink coffee before bed or take several excedrin migraine before bed and go straight to sleep.    Depression screen [facility] 2/9 05/25/2016 04/21/2016 03/17/2016 03/16/2016 02/07/2016  Decreased Interest 0 0 0 0 0  Down, Depressed, Hopeless 0 0 0 0 0  PHQ -  2 Score 0 0 0 0 0     Past Medical History:  Diagnosis Date  . Alopecia   . Anxiety   . Autoimmune disease ([place])    "Alopecia"  . Bacterial vaginosis   . Cyst of spleen   . Depression   . Depression   . Frequent UTI   . Hypertrophy of breast   . IIH (idiopathic intracranial hypertension) 2017   "related to headaches"  . Migraine   . Migraine headache    denies, ruled out - Propranolol.Using for headaches   Past Surgical History:  Procedure Laterality Date  . APPENDECTOMY    . BREAST REDUCTION SURGERY Bilateral 03/26/2015   Procedure: BILATERAL BREAST REDUCTION  ;  Surgeon: Youlanda Roys, MD;  Location: [facility];  Service: Plastics;  Laterality: Bilateral;  . BREATH TEK H PYLORI N/A 05/21/2014   Procedure: BREATH TEK H PYLORI;  Surgeon: Greer Pickerel, MD;   Location: [facility];  Service: General;  Laterality: N/A;  . CESAREAN SECTION    . CESAREAN SECTION    . CHOLECYSTECTOMY    . DILITATION & CURRETTAGE/HYSTROSCOPY WITH NOVASURE ABLATION N/A 07/19/2012   Procedure: HYSTEROSCOPY WITH NOVASURE ABLATION;  Surgeon: Farrel Gobble. Harrington Challenger, MD;  Location: [facility];  Service: Gynecology;  Laterality: N/A;  . ENDOMETRIAL ABLATION    . ESOPHAGOGASTRODUODENOSCOPY N/A 08/16/2015   Procedure: UPPER ESOPHAGOGASTRODUODENOSCOPY (EGD);  Surgeon: Greer Pickerel, MD;  Location: [facility];  Service: General;  Laterality: N/A;  . ESOPHAGOGASTRODUODENOSCOPY N/A 02/21/2016   Procedure: ESOPHAGOGASTRODUODENOSCOPY (EGD);  Surgeon: Greer Pickerel, MD;  Location: [facility];  Service: General;  Laterality: N/A;  . IR GENERIC HISTORICAL  10/04/2015   IR [place] Vivianne Master 10/04/2015 Markus Daft, MD [facility]  . IR GENERIC HISTORICAL  01/31/2016   IR [place] GASTRO/COLONIC TUBE PERCUT W/FLUORO 01/31/2016 Corrie Mckusick, DO [facility]  . IR GENERIC HISTORICAL  04/06/2016   IR [place] TUBE PERCUT W/FLUORO 04/06/2016 Arne Cleveland, MD [facility]  . IR GENERIC HISTORICAL  05/14/2016   IR GASTRIC TUBE PERC CHG W/O IMG GUIDE 05/14/2016 Sandi Mariscal, MD [facility]  . LAPAROSCOPIC GASTROSTOMY N/A 09/30/2015   Procedure: LAPAROSCOPIC GASTROSTOMY TUBE PLACEMENT;  Surgeon: Greer Pickerel, MD;  Location: [facility];  Service: General;  Laterality: N/A;  . LAPAROSCOPIC ROUX-EN-Y GASTRIC BYPASS WITH HIATAL HERNIA REPAIR N/A 07/08/2015   Procedure: LAPAROSCOPIC ROUX-EN-Y GASTRIC BYPASS WITH HIATAL HERNIA REPAIR WITH UPPER ENDOSCOPY;  Surgeon: Greer Pickerel, MD;  Location: [facility];  Service: General;  Laterality: N/A;  . LAPAROSCOPIC SMALL BOWEL RESECTION N/A 09/30/2015   Procedure: LAPAROSCOPIC REVISION OF ROUX LIMB;  Surgeon: Greer Pickerel, MD;  Location: [facility];  Service: General;  Laterality: N/A;  . LAPAROSCOPY N/A 09/30/2015   Procedure: LAPAROSCOPY DIAGNOSTIC;  Surgeon:  Greer Pickerel, MD;  Location: [facility];  Service: General;  Laterality: N/A;  . TUBAL LIGATION    . WISDOM TOOTH EXTRACTION     Current Outpatient Prescriptions on File Prior to Visit  Medication Sig Dispense Refill  . ALPRAZolam (XANAX) 0.5 MG tablet Take 1 tablet (0.5 mg total) by mouth 2 (two) times daily as needed for anxiety. 30 tablet 1  . amitriptyline (ELAVIL) 10 MG tablet Take 1 tablet (10 mg total) by mouth at bedtime. 30 tablet 11  . CALCIUM-VITAMIN D PO Take 15 mLs by mouth 3 (three) times daily.    . Cyanocobalamin (VITAMIN B-12) 1000 MCG/15ML LIQD Take 5 mLs by mouth daily.    Marland Kitchen docusate  sodium (COLACE) 100 MG capsule Take 100 mg by mouth daily.    Marland Kitchen eletriptan (RELPAX) 40 MG tablet Take 1 tablet (40 mg total) by mouth once. Repeat in 2 hours if headache persists or recurs. Max twice daily. 10 tablet 11  . lactulose (CHRONULAC) 10 GM/15ML solution Take 30 g by mouth daily as needed for moderate constipation.   0  . linaclotide (LINZESS) 145 MCG CAPS capsule Take 1 capsule (145 mcg total) by mouth daily before breakfast. 30 capsule 0  . Multiple Vitamin (MULTIVITAMIN) LIQD Take 5 mLs by mouth 2 (two) times daily.    . Nutritional Supplements (FEEDING SUPPLEMENT, VITAL HIGH PROTEIN,) LIQD liquid Place 1,000 mLs into feeding tube daily. 64ml/hr for 8hrs a day as needed for poor oral intake; 300 ml free water q4hr (Patient taking differently: Place 1,000 mLs into feeding tube daily. 78ml/hr for 8hrs a day as needed for poor oral intake; 300 ml free water q4hr) 1000 mL 8  . ondansetron (ZOFRAN-ODT) 4 MG disintegrating tablet Take 1 tablet (4 mg total) by mouth every 6 (six) hours as needed for nausea. 30 tablet 0  . oxyCODONE (OXY IR/ROXICODONE) 5 MG immediate release tablet Take 1 tablet (5 mg total) by mouth every 6 (six) hours as needed for moderate pain. 10 tablet 0  . oxyCODONE (ROXICODONE) 5 MG/5ML solution Take 2.5-5 mLs by mouth every 6 (six) hours as needed for pain.  0  .  pantoprazole (PROTONIX) 40 MG tablet Take 1 tablet (40 mg total) by mouth daily. 30 tablet 1  . polyethylene glycol (MIRALAX / GLYCOLAX) packet Take 17 g by mouth daily.    . sertraline (ZOLOFT) 25 MG tablet Take 1 tablet (25 mg total) by mouth daily. 30 tablet 2  . simethicone (MYLICON) 80 MG chewable tablet Chew 80 mg by mouth every 6 (six) hours as needed for flatulence.    . sucralfate (CARAFATE) 1 GM/10ML suspension Take 1 g by mouth 4 (four) times daily.    Marland Kitchen topiramate (TOPAMAX) 50 MG tablet Take 1 tablet (50 mg total) by mouth daily. 30 tablet 11  . XULANE 150-35 MCG/24HR transdermal patch Place 2 patches onto the skin once a week.  11   No current facility-administered medications on file prior to visit.    Allergies  Allergen Reactions  . Sulfa Antibiotics Anaphylaxis  . Zosyn [Piperacillin Sod-Tazobactam So] Rash   Family History  Problem Relation Age of Onset  . Cancer Mother   . Stroke Father   . Diabetes Brother   . Seizures Son   . Migraines Neg Hx    Social History   Social History  . Marital status: Divorced    Spouse name: N/A  . Number of children: 2  . 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 Smoking status: Never Smoker  . Smokeless tobacco: Never Used  . Alcohol use Yes     Comment: socially  . Drug use: No  . Sexual activity: Yes    Birth control/ protection: Surgical   Other Topics Concern  . None   Social History Narrative   Lives with partner   Caffeine use: minimal coffee   Right-handed     Review of Systems See hpi    Objective:   Physical Exam  Constitutional: She is oriented to person, place, and time. She appears well-developed and well-nourished. No distress.  HENT:  Head: Normocephalic and  atraumatic.  Right Ear: Tympanic membrane, external ear and ear canal normal.  Left Ear: Tympanic membrane, external ear and ear canal normal.  Nose: Nose normal. No  mucosal edema or rhinorrhea.  Mouth/Throat: Uvula is midline, oropharynx is clear and moist and mucous membranes are normal. No oropharyngeal exudate.  Eyes: Conjunctivae are normal. Right eye exhibits no discharge. Left eye exhibits no discharge. No scleral icterus.  Neck: Normal range of motion. Neck supple.  Cardiovascular: Normal rate, regular rhythm, normal heart sounds and intact distal pulses.   Pulmonary/Chest: Effort normal and breath sounds normal.  Abdominal: Soft. Bowel sounds are normal. She exhibits no distension and no mass. There is no tenderness. There is no rebound and no guarding.  Genitourinary: Rectal exam shows external hemorrhoid, internal hemorrhoid and tenderness. Rectal exam shows anal tone normal.  Lymphadenopathy:    She has no cervical adenopathy.  Neurological: She is alert and oriented to person, place, and time.  Skin: Skin is warm and dry. She is not diaphoretic. No erythema.  Psychiatric: Her behavior is normal. She exhibits a depressed mood.   Pictures of all below tests are under the media tab and copes were scanned in phq9 14 GAD7 19 Adult ADD self-report scale - every single item was within sx range MOCA 23/30  BP 130/81   Pulse 73   Temp 98.3 F (36.8 C) (Oral)   Resp 18   Ht 5\' 8"  (1.727 m)   Wt 186 lb (84.4 kg)   SpO2 100%   BMI 28.28 kg/m      Assessment & Plan:   1. Internal hemorrhoid - start anusol suppositories - discuss banding or definitive trx as her surgery f/u visit next wk for her lap band (sees Dr. Redmond Pulling.) Call if needs GI referral for this instead.  2. Memory change - I do not know if it is safe to try stimulant therapy in the setting intercranial hypertenion.  Needs tsh, cbc, b12, etc - pt sched to have labs next wk at her surgeons office so asked to have copy sent to me.  All test + for depression, anxiety, adult ADD, and memory loss/mild memory impairment - all could be sxs of a mood d/o so will do trial of klonopin now.     3. Dysfunction of right eustachian tube     Meds ordered this encounter  Medications  . clonazePAM (KLONOPIN) 0.5 MG tablet    Sig: Take 1 tablet (0.5 mg total) by mouth 2 (two) times daily as needed for anxiety.    Dispense:  30 tablet    Refill:  0  . hydrocortisone (ANUSOL-HC) 25 MG suppository    Sig: Place 1 suppository (25 mg total) rectally 2 (two) times daily. - after a bowel movement if possible    Dispense:  24 suppository    Refill:  1  . fluticasone (FLONASE) 50 MCG/ACT nasal spray    Sig: Place 2 sprays into both nostrils at bedtime.    Dispense:  16 g    Refill:  2  . ipratropium (ATROVENT) 0.03 % nasal spray    Sig: Place 2 sprays into the nose 4 (four) times daily.    Dispense:  30 mL    Refill:  1  . cetirizine (ZYRTEC) 10 MG tablet    Sig: Take 1 tablet (10 mg total) by mouth at bedtime.    Dispense:  30 tablet    Refill:  11   Over 40 min spent in face-to-face evaluation  of and consultation with patient and coordination of care.  Over 50% of this time was spent counseling this patient.   [name], M.D.  Primary Care at [facility] [address] [place], [place] [address] [phone] phone [phone] fax  05/27/16 1:55 AM

## 2016-05-27 ENCOUNTER — Encounter: Payer: Self-pay | Admitting: Neurology

## 2016-05-27 MED FILL — ELETRIPTAN HBR 40 MG TABLET: 40 | 30 days supply | Qty: 9 | Fill #0

## 2016-05-29 ENCOUNTER — Encounter: Payer: Self-pay | Admitting: Family Medicine

## 2016-05-29 DIAGNOSIS — K912 Postsurgical malabsorption, not elsewhere classified: Secondary | ICD-10-CM | POA: Diagnosis not present

## 2016-06-01 DIAGNOSIS — R109 Unspecified abdominal pain: Secondary | ICD-10-CM | POA: Diagnosis not present

## 2016-06-01 LAB — CBC AND DIFFERENTIAL: Hemoglobin: 11.3 g/dL — AB (ref 12.0–16.0)

## 2016-06-02 ENCOUNTER — Telehealth: Payer: Self-pay

## 2016-06-02 NOTE — Telephone Encounter (Signed)
PA reference # [id number] approved per fax from [facility] effective for a max of 12 fills from 05/27/16 to 05/26/17.

## 2016-06-03 MED FILL — CARAFATE 1 GM/10 ML SUSP: 1 | 50 days supply | Qty: 2000 | Fill #0

## 2016-06-08 ENCOUNTER — Ambulatory Visit: Payer: 59 | Admitting: Family Medicine

## 2016-06-11 ENCOUNTER — Encounter: Payer: Self-pay | Admitting: Family Medicine

## 2016-06-11 ENCOUNTER — Ambulatory Visit (INDEPENDENT_AMBULATORY_CARE_PROVIDER_SITE_OTHER): Payer: 59 | Admitting: Family Medicine

## 2016-06-11 VITALS — BP 132/92 | HR 75 | Temp 98.0°F | Resp 16 | Ht 67.5 in | Wt 188.6 lb

## 2016-06-11 DIAGNOSIS — R413 Other amnesia: Secondary | ICD-10-CM | POA: Diagnosis not present

## 2016-06-11 MED ORDER — MIRTAZAPINE 15 MG PO TABS: 15.0000 mg | ORAL_TABLET | Freq: Every day | ORAL | 0 refills | Status: DC

## 2016-06-11 MED ORDER — AMPHETAMINE-DEXTROAMPHETAMINE 5 MG PO TABS: 5.0000 mg | ORAL_TABLET | Freq: Two times a day (BID) | ORAL | 0 refills | Status: DC

## 2016-06-11 MED FILL — DEXTROAMP-AMPHETAMINE 5 MG: 5 | 30 days supply | Qty: 60 | Fill #0

## 2016-06-11 MED FILL — MIRTAZAPINE 15 MG TABLET: 15 | 30 days supply | Qty: 30 | Fill #0

## 2016-06-11 NOTE — Patient Instructions (Addendum)
Take the adderall 5mg  in the morning and take the mirtazapine and amitryptiline around 8-9 pm every night.  If you still aren't able to sleep it is ok to take the clonazepam or the alprazolam. If you are finding that the adderall is wearing off when you still have things to do in the afternoon you can take 1 dose in the morning and 1 dose at noon. If it is not effective but you are not noticing any side effects, you can increase to 2 tabs in the morning and 1 tab at noon.  Make sure you are doing high protein and find ways to remind you to drink.     IF you received an x-ray today, you will receive an invoice from [facility]. Please contact [facility] at [phone] with questions or concerns regarding your invoice.   IF you received labwork today, you will receive an invoice from [facility]. Please contact [facility] at [phone] with questions or concerns regarding your invoice.   Our billing staff will not be able to assist you with questions regarding bills from these companies.  You will be contacted with the lab results as soon as they are available. The fastest way to get your results is to activate your My Chart account. Instructions are located on the last page of this paperwork. If you have not heard from [name] regarding the results in 2 weeks, please contact this office.

## 2016-06-11 NOTE — Progress Notes (Signed)
Subjective:    Patient ID: Margaret Fletcher, female    DOB: 01/15/1971, 46 y.o.   MRN: [id number] Chief Complaint  Patient presents with  . Follow-up    memory, per pt "has not improved  . Anxiety    "better, but hasn't gone away"  . Bruising    circular spots on arms and legs x 1-2 weeks    HPI Has been taking the clonazepam at night with the amitriptyline.  She is sleeping better she thinks.  She does feel a little more calm.  She is getting 4-6 hours but she is still having a little trouble focusing.  She will sleep about 10-4 or 5.  She varies when she goes to bed.   She is not feeling groggy the next morning.  She will take it around 8 or 9. Or later but no matter what she wakes up around 4 or 5.  She only sets an alarm if she has an appt in the morning.  She is still waking up periodically throughout the night multiple times.   She is used to working night shifts.  She is having a smidge of improvement during the day.   Has been a year out of work and not sure if they will hold her job. So a lot of every day stressors. She is trying to eat better to get the feeding tube out but she is having trouble even getting 1 300 cal shake thoughout the day which is disappointing. Dr. Redmond Pulling doesn't know what was going on.  He said everything was fine after her blood work.  She got a really bad memory test in a group where she scored a 1 and everyone else scored a 5 or 6. She is getting circular bruises over all her extremities for no reason.    Past Medical History:  Diagnosis Date  . Alopecia   . Anxiety   . Autoimmune disease ([place])    "Alopecia"  . Bacterial vaginosis   . Cyst of spleen   . Depression   . Depression   . Frequent UTI   . Hypertrophy of breast   . IIH (idiopathic intracranial hypertension) 2017   "related to headaches"  . Migraine   . Migraine headache    denies, ruled out - Propranolol.Using for headaches   Past Surgical History:  Procedure Laterality Date    . APPENDECTOMY    . BREAST REDUCTION SURGERY Bilateral 03/26/2015   Procedure: BILATERAL BREAST REDUCTION  ;  Surgeon: Youlanda Roys, MD;  Location: [facility];  Service: Plastics;  Laterality: Bilateral;  . BREATH TEK H PYLORI N/A 05/21/2014   Procedure: BREATH TEK H PYLORI;  Surgeon: Greer Pickerel, MD;  Location: [facility];  Service: General;  Laterality: N/A;  . CESAREAN SECTION    . CESAREAN SECTION    . CHOLECYSTECTOMY    . DILITATION & CURRETTAGE/HYSTROSCOPY WITH NOVASURE ABLATION N/A 07/19/2012   Procedure: HYSTEROSCOPY WITH NOVASURE ABLATION;  Surgeon: Farrel Gobble. Harrington Challenger, MD;  Location: [facility];  Service: Gynecology;  Laterality: N/A;  . ENDOMETRIAL ABLATION    . ESOPHAGOGASTRODUODENOSCOPY N/A 08/16/2015   Procedure: UPPER ESOPHAGOGASTRODUODENOSCOPY (EGD);  Surgeon: Greer Pickerel, MD;  Location: [facility];  Service: General;  Laterality: N/A;  . ESOPHAGOGASTRODUODENOSCOPY N/A 02/21/2016   Procedure: ESOPHAGOGASTRODUODENOSCOPY (EGD);  Surgeon: Greer Pickerel, MD;  Location: [facility];  Service: General;  Laterality: N/A;  . IR GENERIC HISTORICAL  10/04/2015   IR [place] Vivianne Master 10/04/2015  Markus Daft, MD [facility]  . IR GENERIC HISTORICAL  01/31/2016   IR [place] GASTRO/COLONIC TUBE PERCUT W/FLUORO 01/31/2016 Corrie Mckusick, DO [facility]  . IR GENERIC HISTORICAL  04/06/2016   IR [place] TUBE PERCUT W/FLUORO 04/06/2016 Arne Cleveland, MD [facility]  . IR GENERIC HISTORICAL  05/14/2016   IR GASTRIC TUBE PERC CHG W/O IMG GUIDE 05/14/2016 Sandi Mariscal, MD [facility]  . LAPAROSCOPIC GASTROSTOMY N/A 09/30/2015   Procedure: LAPAROSCOPIC GASTROSTOMY TUBE PLACEMENT;  Surgeon: Greer Pickerel, MD;  Location: [facility];  Service: General;  Laterality: N/A;  . LAPAROSCOPIC ROUX-EN-Y GASTRIC BYPASS WITH HIATAL HERNIA REPAIR N/A 07/08/2015   Procedure: LAPAROSCOPIC ROUX-EN-Y GASTRIC BYPASS WITH HIATAL HERNIA REPAIR WITH UPPER ENDOSCOPY;  Surgeon: Greer Pickerel, MD;  Location: [facility];  Service: General;  Laterality: N/A;  . LAPAROSCOPIC SMALL BOWEL RESECTION N/A 09/30/2015   Procedure: LAPAROSCOPIC REVISION OF ROUX LIMB;  Surgeon: Greer Pickerel, MD;  Location: [facility];  Service: General;  Laterality: N/A;  . LAPAROSCOPY N/A 09/30/2015   Procedure: LAPAROSCOPY DIAGNOSTIC;  Surgeon: Greer Pickerel, MD;  Location: [facility];  Service: General;  Laterality: N/A;  . TUBAL LIGATION    . WISDOM TOOTH EXTRACTION     Current Outpatient Prescriptions on File Prior to Visit  Medication Sig Dispense Refill  . amitriptyline (ELAVIL) 10 MG tablet Take 1 tablet (10 mg total) by mouth at bedtime. 30 tablet 11  . CALCIUM-VITAMIN D PO Take 15 mLs by mouth 3 (three) times daily.    . cetirizine (ZYRTEC) 10 MG tablet Take 1 tablet (10 mg total) by mouth at bedtime. 30 tablet 11  . clonazePAM (KLONOPIN) 0.5 MG tablet Take 1 tablet (0.5 mg total) by mouth 2 (two) times daily as needed for anxiety. 30 tablet 0  . Cyanocobalamin (VITAMIN B-12) 1000 MCG/15ML LIQD Take 5 mLs by mouth daily.    Marland Kitchen docusate sodium (COLACE) 100 MG capsule Take 100 mg by mouth daily.    . fluticasone (FLONASE) 50 MCG/ACT nasal spray Place 2 sprays into both nostrils at bedtime. 16 g 2  . hydrocortisone (ANUSOL-HC) 25 MG suppository Place 1 suppository (25 mg total) rectally 2 (two) times daily. - after a bowel movement if possible 24 suppository 1  . ipratropium (ATROVENT) 0.03 % nasal spray Place 2 sprays into the nose 4 (four) times daily. 30 mL 1  . lactulose (CHRONULAC) 10 GM/15ML solution Take 30 g by mouth daily as needed for moderate constipation.   0  . linaclotide (LINZESS) 145 MCG CAPS capsule Take 1 capsule (145 mcg total) by mouth daily before breakfast. 30 capsule 0  . Multiple Vitamin (MULTIVITAMIN) LIQD Take 5 mLs by mouth 2 (two) times daily.    . Nutritional Supplements (FEEDING SUPPLEMENT, VITAL HIGH PROTEIN,) LIQD liquid Place 1,000 mLs into feeding tube daily. 29ml/hr for 8hrs a day  as needed for poor oral intake; 300 ml free water q4hr (Patient taking differently: Place 1,000 mLs into feeding tube daily. 44ml/hr for 8hrs a day as needed for poor oral intake; 300 ml free water q4hr) 1000 mL 8  . ondansetron (ZOFRAN-ODT) 4 MG disintegrating tablet Take 1 tablet (4 mg total) by mouth every 6 (six) hours as needed for nausea. 30 tablet 0  . oxyCODONE (OXY IR/ROXICODONE) 5 MG immediate release tablet Take 1 tablet (5 mg total) by mouth every 6 (six) hours as needed for moderate pain. 10 tablet 0  . oxyCODONE (ROXICODONE) 5 MG/5ML solution Take 2.5-5 mLs by mouth every  6 (six) hours as needed for pain.  0  . pantoprazole (PROTONIX) 40 MG tablet Take 1 tablet (40 mg total) by mouth daily. 30 tablet 1  . polyethylene glycol (MIRALAX / GLYCOLAX) packet Take 17 g by mouth daily.    . simethicone (MYLICON) 80 MG chewable tablet Chew 80 mg by mouth every 6 (six) hours as needed for flatulence.    . sucralfate (CARAFATE) 1 GM/10ML suspension Take 1 g by mouth 4 (four) times daily.    Marland Kitchen topiramate (TOPAMAX) 50 MG tablet Take 1 tablet (50 mg total) by mouth daily. 30 tablet 11  . XULANE 150-35 MCG/24HR transdermal patch Place 2 patches onto the skin once a week.  11   No current facility-administered medications on file prior to visit.    Allergies  Allergen Reactions  . Sulfa Antibiotics Anaphylaxis  . Zosyn [Piperacillin Sod-Tazobactam So] Rash   Family History  Problem Relation Age of Onset  . Cancer Mother   . Stroke Father   . Diabetes Brother   . Seizures Son   . Migraines Neg Hx    Social History   Social History  . Marital status: Divorced    Spouse name: N/A  . Number of children: 2  . 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 Smoking status: Never Smoker  . Smokeless tobacco: Never Used  . Alcohol use Yes     Comment: socially  . Drug use: No  . Sexual activity: Yes    Birth control/  protection: Surgical   Other Topics Concern  . None   Social History Narrative   Lives with partner   Caffeine use: minimal coffee   Right-handed   Depression screen [facility] 2/9 06/11/2016 05/25/2016 04/21/2016 03/17/2016 03/16/2016  Decreased Interest 0 0 0 0 0  Down, Depressed, Hopeless 0 0 0 0 0  PHQ - 2 Score 0 0 0 0 0     Review of Systems See hpi    Objective:   Physical Exam  Constitutional: She is oriented to person, place, and time. She appears well-developed and well-nourished. No distress.  HENT:  Head: Normocephalic and atraumatic.  Right Ear: External ear normal.  Eyes: Conjunctivae are normal. No scleral icterus.  Pulmonary/Chest: Effort normal.  Neurological: She is alert and oriented to person, place, and time.  Skin: Skin is warm and dry. She is not diaphoretic. No erythema.  Psychiatric: Her speech is normal. Her affect is blunt. She is withdrawn. Cognition and memory are impaired. She does not express impulsivity or inappropriate judgment. She exhibits a depressed mood.      BP (!) 132/92 (BP Location: Right Arm, Cuff Size: Large)   Pulse 75   Temp 98 F (36.7 C) (Oral)   Resp 16   Ht 5' 7.5" (1.715 m)   Wt 188 lb 9.6 oz (85.5 kg)   SpO2 98%   BMI 29.10 kg/m      Assessment & Plan:  Have labs sent over [place] and get last labs. 1. Memory loss due to medical condition      Meds ordered this encounter  Medications  . amphetamine-dextroamphetamine (ADDERALL) 5 MG tablet    Sig: Take 1-2 tablets (5-10 mg total) by mouth 2 (two) times daily with a meal.    Dispense:  60 tablet    Refill:  0  . mirtazapine (REMERON) 15 MG tablet    Sig:  Take 1 tablet (15 mg total) by mouth at bedtime.    Dispense:  30 tablet    Refill:  0      [name], M.D.  Primary Care at [facility] [address] [place], [place] [address] [phone] phone [phone] fax  06/13/16 1:48 PM

## 2016-06-12 MED FILL — XULANE PATCH: 150-35 | 28 days supply | Qty: 3 | Fill #5

## 2016-06-13 ENCOUNTER — Telehealth: Payer: Self-pay

## 2016-06-13 NOTE — Telephone Encounter (Signed)
Needs PA for add med quantity limited  2 per day is limit  I see rx for 5-10mg  bid with 5mg  tabs, so needs to be written for 5mg  bid or 10mg  bid

## 2016-06-15 MED ORDER — AMPHETAMINE-DEXTROAMPHETAMINE 5 MG PO TABS: 5.0000 mg | ORAL_TABLET | Freq: Two times a day (BID) | ORAL | 0 refills | Status: DC

## 2016-06-15 NOTE — Telephone Encounter (Signed)
Will put pt on adderall 5mg  1 tab po bid disp 60 so it is same med dose and same quantity, just decreased sig.  Might be able to call in that change but will print off new rx for pt pick up just in case as well.

## 2016-06-18 NOTE — Telephone Encounter (Signed)
Yes I had brought it up to 102 this afternoon about noon and gave it to Kenney - not sure where she put it. . . Can reprint tomorrow or Sat.  Pt should reschedule her f/u visit to discuss how trial of med went if she hasn't had time to try it yet.

## 2016-06-18 NOTE — Telephone Encounter (Signed)
Pt has an appt next week to discuss meds, did this rx get printed? Its not up front for pick up?

## 2016-06-19 NOTE — Telephone Encounter (Signed)
Up front, pt advised yesterday

## 2016-06-22 ENCOUNTER — Encounter: Payer: Self-pay | Admitting: Skilled Nursing Facility1

## 2016-06-22 ENCOUNTER — Encounter: Payer: 59 | Attending: General Surgery | Admitting: Skilled Nursing Facility1

## 2016-06-22 DIAGNOSIS — Z713 Dietary counseling and surveillance: Secondary | ICD-10-CM | POA: Insufficient documentation

## 2016-06-22 NOTE — Progress Notes (Signed)
Follow-up visit: 9 months Post-Operative RYGB Surgery  Medical Nutrition Therapy:  Appt start time: 400 end time: 435  Primary concerns today: Post-operative Bariatric Surgery Nutrition Management.  Margaret Fletcher returns for a bariatric nutrition follow up post op RYGB.   1 can (8 oz) Vital High protein enteral formula overnight (237 calories, 20.7 grams protein, 5 grams fat, 26.7 grams carbohydrate, 7 oz water) Patient flushes tube with 8oz.    Pt has started a low dose of ADHD medicine and a new headache medicine which may help her with her racing thoughts. Pt states she uses peppermint oil under her lip for smells and believes it works so she can tolerate trying to eat. Pt states she needs the oil, pain medicine, and her stomach coating pill before she tries food. Pt states she has been trying new foods she once did not like before. Pt states she feels like things are better. Pt likes the PB2 smoothie mix with banana with no issue: 29 grams of carbs. Pt states she has tried chicken: no issue made very moist/shrimp: sometimes fine sometimes a tummy ache/steak if its medium it easier to tolerate due to easier to chew and softer, habachi the first time no issue the second with nausea. On days trying new foods: 12 ounces of water and 16 ounces of water. Smoothie day will take all day to drink; fairlife milk is a meal, half a premier protein and half a boiled egg. Pt states she cannot do food and liquid in the same day. Pt states butter, potato are gross now. Pt states she will be starting a work out program with a Marine scientist First week of may.Pt states she sees dr. Redmond Pulling every 2 months. Pt states she drinks soda. Pt states she does not consume artifical sweetner due to headaches. Pt thinks her intercranial hypertension is the cause of her not enjoying certain flavors anymore and why she struggles with wanting the food but not tolerating the food. Pt thinks maybe she prefers bitter flavors than sweet flavors. Pt  states she saw a mental health professional once but felt she did not get anything resolved. Pt states she knows everything moves very slow through her GI due to not having a bowel movement for a few days after a medicinal regimen involving lactulose.    Surgery date: 07/08/2015 Surgery type: RYGB Start weight at [facility]: 278 lbs on 05/24/2014 Weight today: 187.6 lbs Weight change: 1 lbs gain  TANITA  BODY COMP RESULTS  07/01/15 07/23/15 10/31/15 02/07/16 03/16/16 04/20/16 06/22/2016   BMI (kg/m^2) 40.4 38.6 33.4 30.4 29 28.3 28.5   Fat Mass (lbs) 138.5 131.0 103.2 84 80 74 69.6   Fat Free Mass (lbs) 127.5 122.8 116.4 116.2 110.6 112.2 118   Total Body Water (lbs) 93.5 90.4 84.4 83.4 79.2 80.2 84.2    Preferred Learning Style:   No preference indicated   Learning Readiness:   Ready   Fluid intake: 24 oz water and tea + 8oz can enteral formula  Estimated total protein intake: unable to determine  Medications: see list Supplementation: chewable multi and calcium and B12  Drinking while eating: yes Hair loss: unknown, patient has alopecia Carbonated beverages: tried a Coke for nausea but did not like it: feels like the coke dissolves the food in her stomach better due to it being used in her tube N/V/D/C: nausea and constipation Dumping syndrome: none  Recent physical activity:  Will begin in May  Progress Towards Goal(s):  In progress.  Handouts  given during visit include:  none   Nutritional Diagnosis:  NI-2.1 Inadequate intake As related to food intolerance, nausea, and taste aversions.  As evidenced by patient requiring enteral nutrition support to meet nutrient needs.    Intervention:  Nutrition counseling provided. Praised patient on efforts to continue to increase po intake and meet nutrition needs. Discussed overnight enteral feeds via PEG tube to meet fluid and macronutrient needs.  Teaching Method Utilized:  Visual Auditory Hands on  Barriers to learning/adherence  to lifestyle change: nausea and inability to meet nutrition needs orally  Demonstrated degree of understanding via:  Teach Back   Monitoring/Evaluation:  Dietary intake, exercise, and body weight. Follow up in 4 weeks.

## 2016-06-22 NOTE — Patient Instructions (Addendum)
-  Try Kefir: it is in the yogurt aisle, get whatever flavor you want   -Try Jiff Peanut butter powder

## 2016-06-24 DIAGNOSIS — L910 Hypertrophic scar: Secondary | ICD-10-CM | POA: Diagnosis not present

## 2016-06-25 ENCOUNTER — Encounter: Payer: Self-pay | Admitting: Family Medicine

## 2016-06-25 ENCOUNTER — Ambulatory Visit (INDEPENDENT_AMBULATORY_CARE_PROVIDER_SITE_OTHER): Payer: 59 | Admitting: Family Medicine

## 2016-06-25 VITALS — BP 149/100 | HR 77 | Temp 98.8°F | Ht 67.32 in | Wt 189.0 lb

## 2016-06-25 DIAGNOSIS — F4323 Adjustment disorder with mixed anxiety and depressed mood: Secondary | ICD-10-CM

## 2016-06-25 DIAGNOSIS — R413 Other amnesia: Secondary | ICD-10-CM

## 2016-06-25 DIAGNOSIS — F321 Major depressive disorder, single episode, moderate: Secondary | ICD-10-CM

## 2016-06-25 DIAGNOSIS — Z9884 Bariatric surgery status: Secondary | ICD-10-CM

## 2016-06-25 MED ORDER — OXYCODONE HCL 5 MG PO TABS: 5.0000 mg | ORAL_TABLET | ORAL | 0 refills | Status: DC | PRN

## 2016-06-25 MED ORDER — CLONAZEPAM 0.5 MG PO TABS: 0.5000 mg | ORAL_TABLET | Freq: Three times a day (TID) | ORAL | 0 refills | Status: DC | PRN

## 2016-06-25 MED ORDER — AMPHETAMINE-DEXTROAMPHETAMINE 10 MG PO TABS: 10.0000 mg | ORAL_TABLET | Freq: Two times a day (BID) | ORAL | 0 refills | Status: DC

## 2016-06-25 NOTE — Patient Instructions (Signed)
     IF you received an x-ray today, you will receive an invoice from [facility]. Please contact [facility] at [phone] with questions or concerns regarding your invoice.   IF you received labwork today, you will receive an invoice from [facility]. Please contact [facility] at [phone] with questions or concerns regarding your invoice.   Our billing staff will not be able to assist you with questions regarding bills from these companies.  You will be contacted with the lab results as soon as they are available. The fastest way to get your results is to activate your My Chart account. Instructions are located on the last page of this paperwork. If you have not heard from us regarding the results in 2 weeks, please contact this office.     

## 2016-06-25 NOTE — Progress Notes (Signed)
Subjective:    Patient ID: Margaret Fletcher, female    DOB: 12-14-1970, 46 y.o.   MRN: [id number] Chief Complaint  Patient presents with  . Follow-up    HPI  She just have today. She feels like her symtpoms are better but that she needs more effect.  She doesn't notice the antidepressant effect. She has not had any side effects from the adderall - she feels more focused, less anxiety. She is sleeping well by taking a mirtazapine, clonazpeam, and elavil before bed. Sleep not worse. Appetite is ok - she has maintained her weight by 1 lb - lost fat but gained muscle She saw the nutritionist and she realizes that if she eats more she can't drink as much but if she drinks more she has better BM and less pain -a bd feels better but then she can't eat as much. Does the supplemental feeds at night. Yesterday she took it and had a good day - overall ok. Her HAs are better - they have decreased - she has a prn medication to take to prevent migraines.    No BP cuff.  She take 1/2 tab of oxycodone and carafate 30 minutes before her largest meal which is usually dinner. She is conscious of weight gain. She notes that 1/2 tab of oxycodone really does her dull her pain  She does 1 1/2 cans of intake at night and probably has to replace this to get her g-tube out.   Past Medical History:  Diagnosis Date  . Alopecia   . Anxiety   . Autoimmune disease ([place])    "Alopecia"  . Bacterial vaginosis   . Cyst of spleen   . Depression   . Depression   . Frequent UTI   . Hypertrophy of breast   . IIH (idiopathic intracranial hypertension) 2017   "related to headaches"  . Migraine   . Migraine headache    denies, ruled out - Propranolol.Using for headaches   Past Surgical History:  Procedure Laterality Date  . APPENDECTOMY    . BREAST REDUCTION SURGERY Bilateral 03/26/2015   Procedure: BILATERAL BREAST REDUCTION  ;  Surgeon: Youlanda Roys, MD;  Location: [facility];   Service: Plastics;  Laterality: Bilateral;  . BREATH TEK H PYLORI N/A 05/21/2014   Procedure: BREATH TEK H PYLORI;  Surgeon: Greer Pickerel, MD;  Location: [facility];  Service: General;  Laterality: N/A;  . CESAREAN SECTION    . CESAREAN SECTION    . CHOLECYSTECTOMY    . DILITATION & CURRETTAGE/HYSTROSCOPY WITH NOVASURE ABLATION N/A 07/19/2012   Procedure: HYSTEROSCOPY WITH NOVASURE ABLATION;  Surgeon: Farrel Gobble. Harrington Challenger, MD;  Location: [facility];  Service: Gynecology;  Laterality: N/A;  . ENDOMETRIAL ABLATION    . ESOPHAGOGASTRODUODENOSCOPY N/A 08/16/2015   Procedure: UPPER ESOPHAGOGASTRODUODENOSCOPY (EGD);  Surgeon: Greer Pickerel, MD;  Location: [facility];  Service: General;  Laterality: N/A;  . ESOPHAGOGASTRODUODENOSCOPY N/A 02/21/2016   Procedure: ESOPHAGOGASTRODUODENOSCOPY (EGD);  Surgeon: Greer Pickerel, MD;  Location: [facility];  Service: General;  Laterality: N/A;  . IR GENERIC HISTORICAL  10/04/2015   IR [place] Vivianne Master 10/04/2015 Markus Daft, MD [facility]  . IR GENERIC HISTORICAL  01/31/2016   IR [place] GASTRO/COLONIC TUBE PERCUT W/FLUORO 01/31/2016 Corrie Mckusick, DO [facility]  . IR GENERIC HISTORICAL  04/06/2016   IR [place] TUBE PERCUT W/FLUORO 04/06/2016 Arne Cleveland, MD [facility]  . IR GENERIC HISTORICAL  05/14/2016   IR GASTRIC TUBE PERC CHG  W/O IMG GUIDE 05/14/2016 Sandi Mariscal, MD [facility]  . LAPAROSCOPIC GASTROSTOMY N/A 09/30/2015   Procedure: LAPAROSCOPIC GASTROSTOMY TUBE PLACEMENT;  Surgeon: Greer Pickerel, MD;  Location: [facility];  Service: General;  Laterality: N/A;  . LAPAROSCOPIC ROUX-EN-Y GASTRIC BYPASS WITH HIATAL HERNIA REPAIR N/A 07/08/2015   Procedure: LAPAROSCOPIC ROUX-EN-Y GASTRIC BYPASS WITH HIATAL HERNIA REPAIR WITH UPPER ENDOSCOPY;  Surgeon: Greer Pickerel, MD;  Location: [facility];  Service: General;  Laterality: N/A;  . LAPAROSCOPIC SMALL BOWEL RESECTION N/A 09/30/2015   Procedure: LAPAROSCOPIC REVISION OF ROUX LIMB;  Surgeon: Greer Pickerel, MD;  Location: [facility];  Service: General;  Laterality: N/A;  . LAPAROSCOPY N/A 09/30/2015   Procedure: LAPAROSCOPY DIAGNOSTIC;  Surgeon: Greer Pickerel, MD;  Location: [facility];  Service: General;  Laterality: N/A;  . TUBAL LIGATION    . WISDOM TOOTH EXTRACTION     Current Outpatient Prescriptions on File Prior to Visit  Medication Sig Dispense Refill  . amitriptyline (ELAVIL) 10 MG tablet Take 1 tablet (10 mg total) by mouth at bedtime. 30 tablet 11  . CALCIUM-VITAMIN D PO Take 15 mLs by mouth 3 (three) times daily.    . cetirizine (ZYRTEC) 10 MG tablet Take 1 tablet (10 mg total) by mouth at bedtime. 30 tablet 11  . Cyanocobalamin (VITAMIN B-12) 1000 MCG/15ML LIQD Take 5 mLs by mouth daily.    Marland Kitchen docusate sodium (COLACE) 100 MG capsule Take 100 mg by mouth daily.    . fluticasone (FLONASE) 50 MCG/ACT nasal spray Place 2 sprays into both nostrils at bedtime. 16 g 2  . hydrocortisone (ANUSOL-HC) 25 MG suppository Place 1 suppository (25 mg total) rectally 2 (two) times daily. - after a bowel movement if possible 24 suppository 1  . ipratropium (ATROVENT) 0.03 % nasal spray Place 2 sprays into the nose 4 (four) times daily. 30 mL 1  . lactulose (CHRONULAC) 10 GM/15ML solution Take 30 g by mouth daily as needed for moderate constipation.   0  . linaclotide (LINZESS) 145 MCG CAPS capsule Take 1 capsule (145 mcg total) by mouth daily before breakfast. 30 capsule 0  . mirtazapine (REMERON) 15 MG tablet Take 1 tablet (15 mg total) by mouth at bedtime. 30 tablet 0  . Multiple Vitamin (MULTIVITAMIN) LIQD Take 5 mLs by mouth 2 (two) times daily.    . Nutritional Supplements (FEEDING SUPPLEMENT, VITAL HIGH PROTEIN,) LIQD liquid Place 1,000 mLs into feeding tube daily. 64ml/hr for 8hrs a day as needed for poor oral intake; 300 ml free water q4hr (Patient taking differently: Place 1,000 mLs into feeding tube daily. 49ml/hr for 8hrs a day as needed for poor oral intake; 300 ml free water q4hr) 1000 mL 8  .  ondansetron (ZOFRAN-ODT) 4 MG disintegrating tablet Take 1 tablet (4 mg total) by mouth every 6 (six) hours as needed for nausea. 30 tablet 0  . oxyCODONE (ROXICODONE) 5 MG/5ML solution Take 2.5-5 mLs by mouth every 6 (six) hours as needed for pain.  0  . pantoprazole (PROTONIX) 40 MG tablet Take 1 tablet (40 mg total) by mouth daily. 30 tablet 1  . polyethylene glycol (MIRALAX / GLYCOLAX) packet Take 17 g by mouth daily.    . simethicone (MYLICON) 80 MG chewable tablet Chew 80 mg by mouth every 6 (six) hours as needed for flatulence.    . sucralfate (CARAFATE) 1 GM/10ML suspension Take 1 g by mouth 4 (four) times daily.    Marland Kitchen topiramate (TOPAMAX) 50 MG tablet Take 1 tablet (50 mg  total) by mouth daily. 30 tablet 11  . XULANE 150-35 MCG/24HR transdermal patch Place 2 patches onto the skin once a week.  11   No current facility-administered medications on file prior to visit.    Allergies  Allergen Reactions  . Sulfa Antibiotics Anaphylaxis  . Zosyn [Piperacillin Sod-Tazobactam So] Rash   Family History  Problem Relation Age of Onset  . Cancer Mother   . Stroke Father   . Diabetes Brother   . Seizures Son   . Migraines Neg Hx    Social History   Social History  . Marital status: Divorced    Spouse name: N/A  . Number of children: 2  . Years of education: 16   Occupational History  . [employer]- nurse tech    Social History Main Topics  . Smoking status: Never Smoker  . Smokeless tobacco: Never Used  . Alcohol use Yes     Comment: socially  . Drug use: No  . Sexual activity: Yes    Birth control/ protection: Surgical   Other Topics Concern  . None   Social History Narrative   Lives with partner   Caffeine use: minimal coffee   Right-handed   Depression screen [facility] 2/9 06/25/2016 06/11/2016 05/25/2016 04/21/2016 03/17/2016  Decreased Interest 1 0 0 0 0  Down, Depressed, Hopeless 1 0 0 0 0  PHQ - 2 Score 2 0 0 0 0  Altered sleeping 1 - - - -  Tired, decreased energy 1 - -  - -  Change in appetite 3 - - - -  Feeling bad or failure about yourself  1 - - - -  Trouble concentrating 1 - - - -  Moving slowly or fidgety/restless 1 - - - -  Suicidal thoughts 0 - - - -  PHQ-9 Score 10 - - - -  Difficult doing work/chores Somewhat difficult - - - -    Review of Systems See hpi    Objective:   Physical Exam  Constitutional: She is oriented to person, place, and time. She appears well-developed and well-nourished. No distress.  HENT:  Head: Normocephalic and atraumatic.  Right Ear: External ear normal.  Left Ear: External ear normal.  Eyes: Conjunctivae are normal. No scleral icterus.  Neck: Normal range of motion. Neck supple. No thyromegaly present.  Cardiovascular: Normal rate, regular rhythm, normal heart sounds and intact distal pulses.   Pulmonary/Chest: Effort normal and breath sounds normal. No respiratory distress.  Musculoskeletal: She exhibits no edema.  Lymphadenopathy:    She has no cervical adenopathy.  Neurological: She is alert and oriented to person, place, and time.  Skin: Skin is warm and dry. She is not diaphoretic. No erythema.  Psychiatric: She has a normal mood and affect. Her behavior is normal.         BP (!) 149/100 (BP Location: Left Arm, Patient Position: Sitting, Cuff Size: Small)   Pulse 77   Temp 98.8 F (37.1 C) (Oral)   Ht 5' 7.32" (1.71 m)   Wt 189 lb (85.7 kg)   BMI 29.32 kg/m   Assessment & Plan:   1. Adjustment disorder with mixed anxiety and depressed mood   2. Memory loss due to medical condition - needs  To ensure that the amphetamine use is not going to make her NPH worse and not going to decrease appetite cause weight loss.  3. H/O gastric bypass - ok to try to increase oxycodone to twice a day if she will  then be able to eat twice a day - she needs to try to increase her po intake so she can have a chance to get off the tube feeds at night.  4. 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 amphetamine-dextroamphetamine (ADDERALL) 10 MG tablet    Sig: Take 1 tablet (10 mg total) by mouth 2 (two) times daily.    Dispense:  60 tablet    Refill:  0  . oxyCODONE (OXY IR/ROXICODONE) 5 MG immediate release tablet    Sig: Take 1-2 tablets (5-10 mg total) by mouth every 4 (four) hours as needed for moderate pain.    Dispense:  60 tablet    Refill:  0  . clonazePAM (KLONOPIN) 0.5 MG tablet    Sig: Take 1 tablet (0.5 mg total) by mouth 3 (three) times daily as needed for anxiety.    Dispense:  90 tablet    Refill:  0   Over 40 min spent in face-to-face evaluation of and consultation with patient and coordination of care.  Over 50% of this time was spent counseling this patient.   [name], M.D.  Primary Care at [facility] [address] [place], [place] [address] [phone] phone [phone] fax  07/09/16 12:03 AM

## 2016-06-26 ENCOUNTER — Other Ambulatory Visit: Payer: Self-pay | Admitting: Family Medicine

## 2016-06-26 MED FILL — DEXTROAMP-AMP 10 MG TAB: 10 | 30 days supply | Qty: 60 | Fill #0

## 2016-06-26 MED FILL — oxyCODONE HCL 5 MG TABS: 5 | 5 days supply | Qty: 60 | Fill #0

## 2016-06-26 MED FILL — clonazePAM 0.5 MG TABS: 0.5 | 30 days supply | Qty: 90 | Fill #0

## 2016-06-26 MED FILL — DEXTROAMP-AMPHETAMINE 5 MG: 5 | 30 days supply | Qty: 60 | Fill #0

## 2016-06-30 ENCOUNTER — Encounter: Payer: Self-pay | Admitting: Neurology

## 2016-06-30 ENCOUNTER — Encounter: Payer: Self-pay | Admitting: Family Medicine

## 2016-07-02 MED FILL — AMITRIPTYLINE HCL 10 MG TAB: 10 | 30 days supply | Qty: 30 | Fill #1

## 2016-07-09 ENCOUNTER — Ambulatory Visit: Payer: 59 | Admitting: Family Medicine

## 2016-07-09 MED FILL — XULANE PATCH: 150-35 | 21 days supply | Qty: 3 | Fill #6

## 2016-07-18 ENCOUNTER — Encounter: Payer: Self-pay | Admitting: Family Medicine

## 2016-07-18 ENCOUNTER — Ambulatory Visit (INDEPENDENT_AMBULATORY_CARE_PROVIDER_SITE_OTHER): Payer: Self-pay | Admitting: Family Medicine

## 2016-07-18 VITALS — BP 124/83 | HR 65 | Temp 99.3°F | Ht 67.5 in | Wt 186.4 lb

## 2016-07-18 DIAGNOSIS — B3731 Acute candidiasis of vulva and vagina: Secondary | ICD-10-CM

## 2016-07-18 DIAGNOSIS — R300 Dysuria: Secondary | ICD-10-CM

## 2016-07-18 DIAGNOSIS — Z9884 Bariatric surgery status: Secondary | ICD-10-CM

## 2016-07-18 DIAGNOSIS — F4323 Adjustment disorder with mixed anxiety and depressed mood: Secondary | ICD-10-CM

## 2016-07-18 DIAGNOSIS — R413 Other amnesia: Secondary | ICD-10-CM

## 2016-07-18 DIAGNOSIS — B373 Candidiasis of vulva and vagina: Secondary | ICD-10-CM

## 2016-07-18 DIAGNOSIS — Z931 Gastrostomy status: Secondary | ICD-10-CM

## 2016-07-18 DIAGNOSIS — F321 Major depressive disorder, single episode, moderate: Secondary | ICD-10-CM

## 2016-07-18 LAB — POCT WET + KOH PREP
TRICH BY WET PREP: ABSENT
YEAST BY KOH: ABSENT

## 2016-07-18 LAB — POC MICROSCOPIC URINALYSIS (UMFC): Mucus: ABSENT

## 2016-07-18 LAB — POCT URINALYSIS DIP (MANUAL ENTRY)
Bilirubin, UA: NEGATIVE
Glucose, UA: NEGATIVE mg/dL
Ketones, POC UA: NEGATIVE mg/dL
Leukocytes, UA: NEGATIVE
NITRITE UA: NEGATIVE
PH UA: 5.5 (ref 5.0–8.0)
Protein Ur, POC: 30 mg/dL — AB
RBC UA: NEGATIVE
UROBILINOGEN UA: 0.2 U/dL

## 2016-07-18 MED ORDER — MIRTAZAPINE 30 MG PO TABS: 30.0000 mg | ORAL_TABLET | Freq: Every day | ORAL | 1 refills | Status: DC

## 2016-07-18 MED ORDER — FLUCONAZOLE 150 MG PO TABS: 150.0000 mg | ORAL_TABLET | Freq: Once | ORAL | 0 refills | Status: AC

## 2016-07-18 MED ORDER — METOCLOPRAMIDE HCL 5 MG PO TABS: 5.0000 mg | ORAL_TABLET | Freq: Three times a day (TID) | ORAL | 0 refills | Status: DC

## 2016-07-18 NOTE — Progress Notes (Addendum)
Subjective:  By signing my name below, I, Margaret Fletcher, attest that this documentation has been prepared under the direction and in the presence of Margaret Cheadle, MD Electronically Signed: Ladene Fletcher, ED Scribe 07/18/2016 at 8:34 AM.   Patient ID: Margaret Fletcher, female    DOB: 1970/12/23, 46 y.o.   MRN: [id number]  Chief Complaint  Patient presents with  . Follow-up    New Medication  . Dysuria   HPI Margaret Fletcher is a 46 y.o. female who presents to Primary Care at [facility] with an extensive pmhx. 1 year prior took a significant downward turn when she underwent gastric bypass surgery by Dr. Redmond Pulling on 07/08/15. Had numerous complications results in chronic abdominal pain, nausea and inability to tolerate pito. Numerous hospital admissions, exploratory laparotomy and additional complications. Had a pig tube placed and since then has been continuing to struggle. Presented to me in January 4 months prior with complaints of depression, anxiety, mental fogginess, fatigue, confusion. She was trying to take classes since she had been unable to return to work due to complications but was unable to mentally function during these. She had been on Wellbutrin with PRN Xanax for years. 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 She was told she needed a CABG and was unable to obtain due to her health. 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 Katy Fitch and she hasn't been able to see him. Requesting she obtain lab work since Jan but was never consented to have this done. She cant bend, lift, frequently can't stay alone and has to stay with friends. She has struggled with depression for a long time and hasn't tried anything other than Wellbutrin and PRN Xanax.  In January she was changed to clonazepam with hopes of regulating her sleep. She is complaining of racing thoughts, inability to concentrate, poor retention,  inability to recall basic info. She was worried she was developing dementia although asked about adult ADD. She stated she discussed this with her neurologist who didn't think her IIH was contributing. She declined labs, stating her surgeon did them but when labs were obtained there only a hgb. She was then transitioned to Zoloft which made anxiety worse. She felt jittery. Her son ws diagnosed with ADD. Paternal grandma with alzheimer. She thought she was too anxious to sleep and Xanax was increasing. Developed restless legs which responded to xanax. She sometimes drunk caffeine at night which calmed her body and allowed her to sleep. Her initially screening was positive for depression, anxiety, adult ADD and mild memory loss.  Recommended TSH, CMP, CBC which were not done. On combo clonazepam and amitriptyline she reported better sleep 4-6 hours a night but difficulty focusing during the day and waking up during the night. Tearful as she participated in a group memory test where her peers scored 5-6 and she scored 1, however the surgeon said that she was okay. Pt was started on a trial of adderall 5 mg, trial bid and Mirtazapine 15 mg at night.   At last visit 1 month ago, symptoms were better. Pt reported more focus with less anxiety. She also stated that sleep improved on medications. She was working with a nutritionist to increase her intake in order to wing off supplement feeds but PO caused abdominal pain. Surgeon prescribed Oxycodone, taking 0.5 tab and carafate before largest meal which was  dinner which dulled pain to be able to eat a full meal. She would replace 1.5 cans of intake to get her G-tube removed. Concerned that stimulate therapy would suppress her appetite and expressed that she would need to watch intake closely. Pt declined to increase mirtazapine   HAs from IIH decreased. As of 2 weeks ago, pt lost her health insurance. Encouraged pt to apply for health insurance through [employer]. Pt filled a  3 month supply of clonazepam on 4/20. Also filled #60 Adderall 5 mg on 4/20 and #60 10 mg on 4/20. As well as #60 Oxycodone which should be a 2 month supply.   Today, pt states that she is needing more antidepressant during the day due to tearful days. She is only taking clonazepam at night. Pt states that her HAs are better as well. She is eating a little but states that she has dull abdominal pain even with eating small meals such as lettuce, tomato and grilled chicken salad hours prior. She has not returned to school due to numerous hospitalizations. She applied for adult medicaid, states her disability is currently in appeals. She has received information for [employer]. Pt has been taking adderall 5 mg at breakfast and lunch, recently increased to 10 mg but has not noticed any difference.   She also reports dysuria for the past few days. She reports associated symptoms of vaginal itching and intermittent vaginal discharge. Pt denies urinary frequency, urinary urgency.   Past Medical History:  Diagnosis Date  . Alopecia   . Anxiety   . Autoimmune disease ([place])    "Alopecia"  . Bacterial vaginosis   . Cyst of spleen   . Depression   . Depression   . Frequent UTI   . Hypertrophy of breast   . IIH (idiopathic intracranial hypertension) 2017   "related to headaches"  . Migraine   . Migraine headache    denies, ruled out - Propranolol.Using for headaches   Current Outpatient Prescriptions on File Prior to Visit  Medication Sig Dispense Refill  . amitriptyline (ELAVIL) 10 MG tablet Take 1 tablet (10 mg total) by mouth at bedtime. 30 tablet 11  . amphetamine-dextroamphetamine (ADDERALL) 10 MG tablet Take 1 tablet (10 mg total) by mouth 2 (two) times daily. 60 tablet 0  . CALCIUM-VITAMIN D PO Take 15 mLs by mouth 3 (three) times daily.    . cetirizine (ZYRTEC) 10 MG tablet Take 1 tablet (10 mg total) by mouth at bedtime. 30 tablet 11  . clonazePAM (KLONOPIN) 0.5 MG tablet Take 1 tablet (0.5 mg  total) by mouth 3 (three) times daily as needed for anxiety. 90 tablet 0  . Cyanocobalamin (VITAMIN B-12) 1000 MCG/15ML LIQD Take 5 mLs by mouth daily.    Marland Kitchen docusate sodium (COLACE) 100 MG capsule Take 100 mg by mouth daily.    . fluticasone (FLONASE) 50 MCG/ACT nasal spray Place 2 sprays into both nostrils at bedtime. 16 g 2  . hydrocortisone (ANUSOL-HC) 25 MG suppository Place 1 suppository (25 mg total) rectally 2 (two) times daily. - after a bowel movement if possible 24 suppository 1  . ipratropium (ATROVENT) 0.03 % nasal spray Place 2 sprays into the nose 4 (four) times daily. 30 mL 1  . lactulose (CHRONULAC) 10 GM/15ML solution Take 30 g by mouth daily as needed for moderate constipation.   0  . linaclotide (LINZESS) 145 MCG CAPS capsule Take 1 capsule (145 mcg total) by mouth daily before breakfast. 30 capsule 0  . mirtazapine (  REMERON) 15 MG tablet Take 1 tablet (15 mg total) by mouth at bedtime. 30 tablet 0  . Multiple Vitamin (MULTIVITAMIN) LIQD Take 5 mLs by mouth 2 (two) times daily.    . Nutritional Supplements (FEEDING SUPPLEMENT, VITAL HIGH PROTEIN,) LIQD liquid Place 1,000 mLs into feeding tube daily. 23ml/hr for 8hrs a day as needed for poor oral intake; 300 ml free water q4hr (Patient taking differently: Place 1,000 mLs into feeding tube daily. 90ml/hr for 8hrs a day as needed for poor oral intake; 300 ml free water q4hr) 1000 mL 8  . ondansetron (ZOFRAN-ODT) 4 MG disintegrating tablet Take 1 tablet (4 mg total) by mouth every 6 (six) hours as needed for nausea. 30 tablet 0  . oxyCODONE (OXY IR/ROXICODONE) 5 MG immediate release tablet Take 1-2 tablets (5-10 mg total) by mouth every 4 (four) hours as needed for moderate pain. 60 tablet 0  . oxyCODONE (ROXICODONE) 5 MG/5ML solution Take 2.5-5 mLs by mouth every 6 (six) hours as needed for pain.  0  . pantoprazole (PROTONIX) 40 MG tablet Take 1 tablet (40 mg total) by mouth daily. 30 tablet 1  . polyethylene glycol (MIRALAX /  GLYCOLAX) packet Take 17 g by mouth daily.    . simethicone (MYLICON) 80 MG chewable tablet Chew 80 mg by mouth every 6 (six) hours as needed for flatulence.    . sucralfate (CARAFATE) 1 GM/10ML suspension Take 1 g by mouth 4 (four) times daily.    Marland Kitchen topiramate (TOPAMAX) 50 MG tablet Take 1 tablet (50 mg total) by mouth daily. 30 tablet 11  . XULANE 150-35 MCG/24HR transdermal patch Place 2 patches onto the skin once a week.  11   No current facility-administered medications on file prior to visit.    Allergies  Allergen Reactions  . Sulfa Antibiotics Anaphylaxis  . Zosyn [Piperacillin Sod-Tazobactam So] Rash   Past Surgical History:  Procedure Laterality Date  . APPENDECTOMY    . BREAST REDUCTION SURGERY Bilateral 03/26/2015   Procedure: BILATERAL BREAST REDUCTION  ;  Surgeon: Youlanda Roys, MD;  Location: [facility];  Service: Plastics;  Laterality: Bilateral;  . BREATH TEK H PYLORI N/A 05/21/2014   Procedure: BREATH TEK H PYLORI;  Surgeon: Greer Pickerel, MD;  Location: [facility];  Service: General;  Laterality: N/A;  . CESAREAN SECTION    . CESAREAN SECTION    . CHOLECYSTECTOMY    . DILITATION & CURRETTAGE/HYSTROSCOPY WITH NOVASURE ABLATION N/A 07/19/2012   Procedure: HYSTEROSCOPY WITH NOVASURE ABLATION;  Surgeon: Farrel Gobble. Harrington Challenger, MD;  Location: [facility];  Service: Gynecology;  Laterality: N/A;  . ENDOMETRIAL ABLATION    . ESOPHAGOGASTRODUODENOSCOPY N/A 08/16/2015   Procedure: UPPER ESOPHAGOGASTRODUODENOSCOPY (EGD);  Surgeon: Greer Pickerel, MD;  Location: [facility];  Service: General;  Laterality: N/A;  . ESOPHAGOGASTRODUODENOSCOPY N/A 02/21/2016   Procedure: ESOPHAGOGASTRODUODENOSCOPY (EGD);  Surgeon: Greer Pickerel, MD;  Location: [facility];  Service: General;  Laterality: N/A;  . IR GENERIC HISTORICAL  10/04/2015   IR [place] Vivianne Master 10/04/2015 Markus Daft, MD [facility]  . IR GENERIC HISTORICAL  01/31/2016   IR [place] GASTRO/COLONIC  TUBE PERCUT W/FLUORO 01/31/2016 Corrie Mckusick, DO [facility]  . IR GENERIC HISTORICAL  04/06/2016   IR [place] TUBE PERCUT W/FLUORO 04/06/2016 Arne Cleveland, MD [facility]  . IR GENERIC HISTORICAL  05/14/2016   IR GASTRIC TUBE PERC CHG W/O IMG GUIDE 05/14/2016 Sandi Mariscal, MD [facility]  . LAPAROSCOPIC GASTROSTOMY N/A 09/30/2015   Procedure:  LAPAROSCOPIC GASTROSTOMY TUBE PLACEMENT;  Surgeon: Greer Pickerel, MD;  Location: [facility];  Service: General;  Laterality: N/A;  . LAPAROSCOPIC ROUX-EN-Y GASTRIC BYPASS WITH HIATAL HERNIA REPAIR N/A 07/08/2015   Procedure: LAPAROSCOPIC ROUX-EN-Y GASTRIC BYPASS WITH HIATAL HERNIA REPAIR WITH UPPER ENDOSCOPY;  Surgeon: Greer Pickerel, MD;  Location: [facility];  Service: General;  Laterality: N/A;  . LAPAROSCOPIC SMALL BOWEL RESECTION N/A 09/30/2015   Procedure: LAPAROSCOPIC REVISION OF ROUX LIMB;  Surgeon: Greer Pickerel, MD;  Location: [facility];  Service: General;  Laterality: N/A;  . LAPAROSCOPY N/A 09/30/2015   Procedure: LAPAROSCOPY DIAGNOSTIC;  Surgeon: Greer Pickerel, MD;  Location: [facility];  Service: General;  Laterality: N/A;  . TUBAL LIGATION    . WISDOM TOOTH EXTRACTION     Family History  Problem Relation Age of Onset  . Cancer Mother   . Stroke Father   . Diabetes Brother   . Seizures Son   . Migraines Neg Hx    Social History   Social History  . Marital status: Divorced    Spouse name: N/A  . Number of children: 2  . Years of education: 16   Occupational History  . [employer]- nurse tech    Social History Main Topics  . Smoking status: Never Smoker  . Smokeless tobacco: Never Used  . Alcohol use Yes     Comment: socially  . Drug use: No  . Sexual activity: Yes    Birth control/ protection: Surgical   Other Topics Concern  . None   Social History Narrative   Lives with partner   Caffeine use: minimal coffee   Right-handed   Depression screen [facility] 2/9 07/18/2016 06/25/2016 06/11/2016 05/25/2016 04/21/2016  Decreased Interest 0 1 0 0  0  Down, Depressed, Hopeless 1 1 0 0 0  PHQ - 2 Score 1 2 0 0 0  Altered sleeping - 1 - - -  Tired, decreased energy - 1 - - -  Change in appetite - 3 - - -  Feeling bad or failure about yourself  - 1 - - -  Trouble concentrating - 1 - - -  Moving slowly or fidgety/restless - 1 - - -  Suicidal thoughts - 0 - - -  PHQ-9 Score - 10 - - -  Difficult doing work/chores - Somewhat difficult - - -    Review of Systems  Genitourinary: Positive for dysuria and vaginal discharge. Negative for frequency and urgency.  see hpi    Objective:   Physical Exam  Constitutional: She is oriented to person, place, and time. She appears well-developed and well-nourished. No distress.  HENT:  Head: Normocephalic and atraumatic.  Eyes: Conjunctivae and EOM are normal.  Neck: Neck supple. No tracheal deviation present.  Cardiovascular: Normal rate.   Pulmonary/Chest: Effort normal. No respiratory distress.  Genitourinary: There is no rash, tenderness or lesion on the right labia. There is no rash, tenderness or lesion on the left labia. No erythema or bleeding in the vagina. Vaginal discharge (white clumpy) found.  Musculoskeletal: Normal range of motion.  Lymphadenopathy:    She has no cervical adenopathy.       Right: No inguinal and no supraclavicular adenopathy present.       Left: No inguinal and no supraclavicular adenopathy present.  Neurological: She is alert and oriented to person, place, and time.  Skin: Skin is warm and dry.  Psychiatric: She has a normal mood and affect. Her behavior is normal.  Nursing note and vitals  reviewed.  BP 124/83   Pulse 65   Temp 99.3 F (37.4 C) (Oral)   Ht 5' 7.5" (1.715 m)   Wt 186 lb 6 oz (84.5 kg)   SpO2 100%   BMI 28.76 kg/m     Results for orders placed or performed in visit on 07/18/16  POCT urinalysis dipstick  Result Value Ref Range   Color, UA yellow yellow   Clarity, UA clear clear   Glucose, UA negative negative mg/dL   Bilirubin, UA  negative negative   Ketones, POC UA negative negative mg/dL   Spec Grav, UA >=1.030 (A) 1.010 - 1.025   Blood, UA negative negative   pH, UA 5.5 5.0 - 8.0   Protein Ur, POC =30 (A) negative mg/dL   Urobilinogen, UA 0.2 0.2 or 1.0 E.U./dL   Nitrite, UA Negative Negative   Leukocytes, UA Negative Negative  POCT Microscopic Urinalysis ([facility])  Result Value Ref Range   WBC,UR,HPF,POC None None WBC/hpf   RBC,UR,HPF,POC None None RBC/hpf   Bacteria None None, Too numerous to count   Mucus Absent Absent   Epithelial Cells, UR Per Microscopy Few (A) None, Too numerous to count cells/hpf  POCT Wet + KOH Prep  Result Value Ref Range   Yeast by KOH Absent Absent   Yeast by wet prep Present (A) Absent   WBC by wet prep Few Few   Clue Cells Wet Prep HPF POC None None   Trich by wet prep Absent Absent   Bacteria Wet Prep HPF POC Many (A) Few   Epithelial Cells By [employer] Pref ([facility]) Few None, Few, Too numerous to count   RBC,UR,HPF,POC None None RBC/hpf   Assessment & Plan:   1. Dysuria   2. Vaginal moniliasis   3. Adjustment disorder with mixed anxiety and depressed mood - depression still quite prominent - need to increase mirtazapine from 15 to 30 qhs  4. Memory loss due to medical condition   5. H/O gastric bypass - start trial of reglan qidac  6. Current moderate episode of major depressive disorder without prior episode ([place])   7. G tube feedings ([place])     Orders Placed This Encounter  Procedures  . Care order/instruction:    AVS printed - let patient go!  Marland Kitchen POCT urinalysis dipstick  . POCT Microscopic Urinalysis ([facility])  . POCT Wet + KOH Prep    Meds ordered this encounter  Medications  . metoCLOPramide (REGLAN) 5 MG tablet    Sig: Take 1 tablet (5 mg total) by mouth 4 (four) times daily -  before meals and at bedtime.    Dispense:  120 tablet    Refill:  0  . mirtazapine (REMERON) 30 MG tablet    Sig: Take 1 tablet (30 mg total) by mouth at bedtime.    Dispense:  30 tablet      Refill:  1  . fluconazole (DIFLUCAN) 150 MG tablet    Sig: Take 1 tablet (150 mg total) by mouth once. Repeat if needed after 3d    Dispense:  2 tablet    Refill:  0    I personally performed the services described in this documentation, which was scribed in my presence. The recorded information has been reviewed and considered, and addended by me as needed.   Margaret Fletcher, M.D.  Primary Care at [facility] [address] [place], [place] [address] [phone] phone [phone] fax  07/21/16 12:34 AM

## 2016-07-18 NOTE — Patient Instructions (Addendum)
Meds ordered this encounter  Medications  . metoCLOPramide (REGLAN) 5 MG tablet    Sig: Take 1 tablet (5 mg total) by mouth 4 (four) times daily -  before meals and at bedtime.    Dispense:  120 tablet    Refill:  0  . mirtazapine (REMERON) 30 MG tablet    Sig: Take 1 tablet (30 mg total) by mouth at bedtime.    Dispense:  30 tablet    Refill:  1  . fluconazole (DIFLUCAN) 150 MG tablet    Sig: Take 1 tablet (150 mg total) by mouth once. Repeat if needed after 3d    Dispense:  2 tablet    Refill:  0      IF you received an x-ray today, you will receive an invoice from [facility] Radiology. Please contact [facility] at [phone] with questions or concerns regarding your invoice.   IF you received labwork today, you will receive an invoice from [place]. Please contact [facility] at [phone] with questions or concerns regarding your invoice.   Our billing staff will not be able to assist you with questions regarding bills from these companies.  You will be contacted with the lab results as soon as they are available. The fastest way to get your results is to activate your My Chart account. Instructions are located on the last page of this paperwork. If you have not heard from [name] regarding the results in 2 weeks, please contact this office.     Adjustment Disorder Adjustment disorder is an unusually severe reaction to a stressful life event, such as the loss of a job or physical illness. The event may be any stressful event other than the loss of a loved one. Adjustment disorder may affect your feelings, your thinking, how you act, or a combination of these. It may interfere with personal relationships or with the way you are at work, school, or home. People with this disorder are at risk for suicide and substance abuse. They may develop a more serious mental disorder, such as major depressive disorder or post-traumatic stress disorder. SIGNS AND SYMPTOMS  Symptoms may  include:  Sadness, depressed mood, or crying spells.  Loss of enjoyment.  Change in appetite or weight.  Sense of loss or hopelessness.  Thoughts of suicide.  Anxiety, worry, or nervousness.  Trouble sleeping.  Avoiding family and friends.  Poor school performance.  Fighting or vandalism.  Reckless driving.  Skipping school.  Poor work Systems analyst.  Ignoring bills. Symptoms of adjustment disorder start within 3 months of the stressful life event. They do not last more than 6 months after the event has ended. DIAGNOSIS  To make a diagnosis, your health care provider will ask about what has happened in your life and how it has affected you. He or she may also ask about your medical history and use of medicines, alcohol, and other substances. Your health care provider may do a physical exam and order lab tests or other studies. You may be referred to a mental health specialist for evaluation. TREATMENT  Treatment options include:  Counseling or talk therapy. Talk therapy is usually provided by mental health specialists.  Medicine. Certain medicines may help with depression, anxiety, and sleep.  Support groups. Support groups offer emotional support, advice, and guidance. They are made up of people who have had similar experiences. HOME CARE INSTRUCTIONS  Keep all follow-up visits as directed by your health care provider. This is important.  Take medicines only as directed  by your health care provider. SEEK MEDICAL CARE IF:  Your symptoms get worse.  SEEK IMMEDIATE MEDICAL CARE IF: You have serious thoughts about hurting yourself or someone else. MAKE SURE YOU:  Understand these instructions.  Will watch your condition.  Will get help right away if you are not doing well or get worse. This information is not intended to replace advice given to you by your health care provider. Make sure you discuss any questions you have with your health care provider. Document  Released: 10/28/2005 Document Revised: 06/17/2015 Document Reviewed: 07/18/2013 Elsevier Interactive Patient Education  2017 [place] With Depression Everyone experiences occasional disappointment, sadness, and loss in their lives. When you are feeling down, blue, or sad for at least 2 weeks in a row, it may mean that you have depression. Depression can affect your thoughts and feelings, relationships, daily activities, and physical health. It is caused by changes in the way your brain functions. If you receive a diagnosis of depression, your health care provider will tell you which type of depression you have and what treatment options are available to you. If you are living with depression, there are ways to help you recover from it and also ways to prevent it from coming back. How to cope with lifestyle changes Coping with stress  Stress is your body's reaction to life changes and events, both good and bad. Stressful situations may include:  Getting married.  The death of a spouse.  Losing a job.  Retiring.  Having a baby. Stress can last just a few hours or it can be ongoing. Stress can play a major role in depression, so it is important to learn both how to cope with stress and how to think about it differently. Talk with your health care provider or a counselor if you would like to learn more about stress reduction. He or she may suggest some stress reduction techniques, such as:  Music therapy. This can include creating music or listening to music. Choose music that you enjoy and that inspires you.  Mindfulness-based meditation. This kind of meditation can be done while sitting or walking. It involves being aware of your normal breaths, rather than trying to control your breathing.  Centering prayer. This is a kind of meditation that involves focusing on a spiritual word or phrase. Choose a word, phrase, or sacred image that is meaningful to you and that brings you  peace.  Deep breathing. To do this, expand your stomach and inhale slowly through your nose. Hold your breath for 3-5 seconds, then exhale slowly, allowing your stomach muscles to relax.  Muscle relaxation. This involves intentionally tensing muscles then relaxing them. Choose a stress reduction technique that fits your lifestyle and personality. Stress reduction techniques take time and practice to develop. Set aside 5-15 minutes a day to do them. Therapists can offer training in these techniques. The training may be covered by some insurance plans. Other things you can do to manage stress include:  Keeping a stress diary. This can help you learn what triggers your stress and ways to control your response.  Understanding what your limits are and saying no to requests or events that lead to a schedule that is too full.  Thinking about how you respond to certain situations. You may not be able to control everything, but you can control how you react.  Adding humor to your life by watching funny films or TV shows.  Making time for activities that help  you relax and not feeling guilty about spending your time this way. Medicines  Your health care provider may suggest certain medicines if he or she feels that they will help improve your condition. Avoid using alcohol and other substances that may prevent your medicines from working properly (may interact). It is also important to:  Talk with your pharmacist or health care provider about all the medicines that you take, their possible side effects, and what medicines are safe to take together.  Make it your goal to take part in all treatment decisions (shared decision-making). This includes giving input on the side effects of medicines. It is best if shared decision-making with your health care provider is part of your total treatment plan. If your health care provider prescribes a medicine, you may not notice the full benefits of it for 4-8 weeks.  Most people who are treated for depression need to be on medicine for at least 6-12 months after they feel better. If you are taking medicines as part of your treatment, do not stop taking medicines without first talking to your health care provider. You may need to have the medicine slowly decreased (tapered) over time to decrease the risk of harmful side effects. Relationships  Your health care provider may suggest family therapy along with individual therapy and drug therapy. While there may not be family problems that are causing you to feel depressed, it is still important to make sure your family learns as much as they can about your mental health. Having your family's support can help make your treatment successful. How to recognize changes in your condition Everyone has a different response to treatment for depression. Recovery from major depression happens when you have not had signs of major depression for two months. This may mean that you will start to:  Have more interest in doing activities.  Feel less hopeless than you did 2 months ago.  Have more energy.  Overeat less often, or have better or improving appetite.  Have better concentration. Your health care provider will work with you to decide the next steps in your recovery. It is also important to recognize when your condition is getting worse. Watch for these signs:  Having fatigue or low energy.  Eating too much or too little.  Sleeping too much or too little.  Feeling restless, agitated, or hopeless.  Having trouble concentrating or making decisions.  Having unexplained physical complaints.  Feeling irritable, angry, or aggressive. Get help as soon as you or your family members notice these symptoms coming back. How to get support and help from others How to talk with friends and family members about your condition  Talking to friends and family members about your condition can provide you with one way to get  support and guidance. Reach out to trusted friends or family members, explain your symptoms to them, and let them know that you are working with a health care provider to treat your depression. Financial resources  Not all insurance plans cover mental health care, so it is important to check with your insurance carrier. If paying for co-pays or counseling services is a problem, search for a local or county mental health care center. They may be able to offer public mental health care services at low or no cost when you are not able to see a private health care provider. If you are taking medicine for depression, you may be able to get the generic form, which may be less expensive. Some makers  of prescription medicines also offer help to patients who cannot afford the medicines they need. Follow these instructions at home:  Get the right amount and quality of sleep.  Cut down on using caffeine, tobacco, alcohol, and other potentially harmful substances.  Try to exercise, such as walking or lifting small weights.  Take over-the-counter and prescription medicines only as told by your health care provider.  Eat a healthy diet that includes plenty of vegetables, fruits, whole grains, low-fat dairy products, and lean protein. Do not eat a lot of foods that are high in solid fats, added sugars, or salt.  Keep all follow-up visits as told by your health care provider. This is important. Contact a health care provider if:  You stop taking your antidepressant medicines, and you have any of these symptoms:  Nausea.  Headache.  Feeling lightheaded.  Chills and body aches.  Not being able to sleep (insomnia).  You or your friends and family think your depression is getting worse. Get help right away if:  You have thoughts of hurting yourself or others. If you ever feel like you may hurt yourself or others, or have thoughts about taking your own life, get help right away. You can go to your  nearest emergency department or call:  Your local emergency services (911 in the [place]  A suicide crisis helpline, such as the [place] at 820-218-4585. This is open 24-hours a day. Summary  If you are living with depression, there are ways to help you recover from it and also ways to prevent it from coming back.  Work with your health care team to create a management plan that includes counseling, stress management techniques, and healthy lifestyle habits. This information is not intended to replace advice given to you by your health care provider. Make sure you discuss any questions you have with your health care provider. Document Released: 01/27/2016 Document Revised: 01/27/2016 Document Reviewed: 01/27/2016 Elsevier Interactive Patient Education  2017 [employer].

## 2016-07-20 MED FILL — FLUCONAZOLE 150 MG TABLET: 150 | 3 days supply | Qty: 2 | Fill #0

## 2016-07-20 MED FILL — METOCLOPRAMIDE 5 MG TABLET: 5 | 30 days supply | Qty: 120 | Fill #0

## 2016-07-20 MED FILL — MIRTAZAPINE 30 MG TABLET: 30 | 30 days supply | Qty: 30 | Fill #0

## 2016-07-21 DIAGNOSIS — Z0271 Encounter for disability determination: Secondary | ICD-10-CM

## 2016-07-29 ENCOUNTER — Other Ambulatory Visit: Payer: Self-pay | Admitting: *Deleted

## 2016-07-29 NOTE — Patient Outreach (Addendum)
[facility]) Care Management  07/29/2016  Margaret Fletcher [birth year] [id number]   Subjective:  Received voicemail message from patient and requested call back. Telephone call to patient's home / mobile number, spoke with patient, and HIPAA verified.  Discussed [facility] Care Management Consult follow up, patient voiced understanding, and is in agreement to follow up.   Patient states she is doing ok, has not had any recent hospitalizations, has been notified by [facility] that they can no longer hold her position, has been offered Cobra, has applied for adult Medicaid (currently in appeals process), and applied for Social Security Disability (currently in appeals process).  States she has several questions regarding what resources are available to her.    States she has no received a packet of community resources from [employer].  RNCM advised patient to follow up with [place] / Human Resources regarding exit / Engineer, site.  Patient voices understanding and will call the benefits center to follow up on the packet status.  States she has $300 available on her benny card, unable to access without paying $600 Cobra payment,  can not afford to make the Cobra payment at this time, is currently having issues affording hormone patches, and seeking assistance to obtain.  RNCM advised patient to follow up with [place] card administer [employer]) to verify criteria to access remaining funds,  Medicaid worker regarding application status, and medication assistance.  Patient voices understanding and states she will follow up with [place] regarding [place] card access to clarify access and Medicaid worker regarding assistance with medications. Patient states her providers are working with her and allow her to make payments for services.  States she is also utilizing her MyChart access to ask questions as needed and decrease out of pocket cost. Patient states she does not  have any transition of care, care coordination, disease management, disease monitoring, or transportation needs at this time. States she is very appreciative of the follow up and is in agreement to receive [place] [facility] information.  Patient is aware that she will no longer be eligible for [facility] Care Management services if no Cobra benefits selected.   Objective:Per chart review: Patient hospitalized 02/19/16 - 02/24/16 for epigastric pain. Patient has a history of Prediabetes, status post gastric bypass (hospitalized 07/08/15 - 07/10/15), autoimmune disorder, total parenteral nutrition (TPN), alopecia,depression, and Protein-calorie malnutrition. Her postop course was complicated by being readmitted on May 8 through the 12 for difficulty with oral intake. Received IV fluids 3 timesper week via PICC line. Readmitted 08/16/15 - 08/19/15 with epigastric pain, sent home with TPN. Treated in ED 08/21/15 hyperkalemia. Has continued to have increased headaches. Readmitted 09/11/15 -09/14/15 bacteremia with PICC removal. Readmitted 09/30/15 -10/10/15 epigastric pain, moderate protein malnutrition, s/p revision Roux limb, G-tube placement on 09/30/15.  Last transition of care completed on 02/27/16.   Assessment: Referral [facility] Transition of care referral on 02/24/16. [facility] Consult follow up completed, no further telephonic RNCM needs at this time and will proceed with case closure.  Plan: RNCM will send patient successful outreach letter, [facility] pamphlet, and magnet. RNCM will send case closure due to follow up completed request to [name].     Everet Flagg H. Annia Friendly, BSN, [facility] Telephonic CM Phone: [phone] Fax: [phone]

## 2016-07-30 ENCOUNTER — Encounter: Payer: Self-pay | Admitting: *Deleted

## 2016-08-05 ENCOUNTER — Encounter: Payer: Self-pay | Admitting: Family Medicine

## 2016-08-13 ENCOUNTER — Emergency Department (HOSPITAL_COMMUNITY)
Admission: EM | Admit: 2016-08-13 | Discharge: 2016-08-13 | Disposition: A | Discharge status: Home / Self Care | Payer: 59 | Attending: Emergency Medicine | Admitting: Emergency Medicine

## 2016-08-13 ENCOUNTER — Emergency Department (HOSPITAL_COMMUNITY): Payer: 59

## 2016-08-13 DIAGNOSIS — Z79899 Other long term (current) drug therapy: Secondary | ICD-10-CM | POA: Insufficient documentation

## 2016-08-13 DIAGNOSIS — R51 Headache: Secondary | ICD-10-CM | POA: Insufficient documentation

## 2016-08-13 DIAGNOSIS — H5704 Mydriasis: Secondary | ICD-10-CM | POA: Insufficient documentation

## 2016-08-13 DIAGNOSIS — H57052 Tonic pupil, left eye: Secondary | ICD-10-CM | POA: Insufficient documentation

## 2016-08-13 DIAGNOSIS — Z5181 Encounter for therapeutic drug level monitoring: Secondary | ICD-10-CM | POA: Insufficient documentation

## 2016-08-13 LAB — CBC WITH DIFFERENTIAL/PLATELET
BASOS PCT: 1 %
Basophils Absolute: 0.1 10*3/uL (ref 0.0–0.1)
Eosinophils Absolute: 0.2 10*3/uL (ref 0.0–0.7)
Eosinophils Relative: 2 %
HEMATOCRIT: 36.3 % (ref 36.0–46.0)
HEMOGLOBIN: 12.1 g/dL (ref 12.0–15.0)
LYMPHS PCT: 25 %
Lymphs Abs: 2.1 10*3/uL (ref 0.7–4.0)
MCH: 30.6 pg (ref 26.0–34.0)
MCHC: 33.3 g/dL (ref 30.0–36.0)
MCV: 91.9 fL (ref 78.0–100.0)
MONO ABS: 0.4 10*3/uL (ref 0.1–1.0)
MONOS PCT: 4 %
NEUTROS ABS: 5.6 10*3/uL (ref 1.7–7.7)
NEUTROS PCT: 68 %
Platelets: 284 10*3/uL (ref 150–400)
RBC: 3.95 MIL/uL (ref 3.87–5.11)
RDW: 13 % (ref 11.5–15.5)
WBC: 8.3 10*3/uL (ref 4.0–10.5)

## 2016-08-13 LAB — COMPREHENSIVE METABOLIC PANEL
ALBUMIN: 3.2 g/dL — AB (ref 3.5–5.0)
ALT: 15 U/L (ref 14–54)
ANION GAP: 5 (ref 5–15)
AST: 18 U/L (ref 15–41)
Alkaline Phosphatase: 45 U/L (ref 38–126)
BUN: 16 mg/dL (ref 6–20)
CHLORIDE: 105 mmol/L (ref 101–111)
CO2: 27 mmol/L (ref 22–32)
Calcium: 8.4 mg/dL — ABNORMAL LOW (ref 8.9–10.3)
Creatinine, Ser: 1.21 mg/dL — ABNORMAL HIGH (ref 0.44–1.00)
GFR calc non Af Amer: 53 mL/min — ABNORMAL LOW (ref >60)
GLUCOSE: 99 mg/dL (ref 65–99)
POTASSIUM: 4.2 mmol/L (ref 3.5–5.1)
SODIUM: 137 mmol/L (ref 135–145)
Total Bilirubin: 0.4 mg/dL (ref 0.3–1.2)
Total Protein: 6.4 g/dL — ABNORMAL LOW (ref 6.5–8.1)

## 2016-08-13 LAB — I-STAT BETA HCG BLOOD, ED (MC, WL, AP ONLY): I-stat hCG, quantitative: <5 m[IU]/mL (ref <5)

## 2016-08-13 LAB — CBG MONITORING, ED: Glucose-Capillary: 110 mg/dL — ABNORMAL HIGH (ref 65–99)

## 2016-08-13 LAB — PROTIME-INR
INR: 0.95
PROTHROMBIN TIME: 12.6 s (ref 11.4–15.2)

## 2016-08-13 NOTE — ED Notes (Signed)
CHARGE MATT RN MADE AWARE. REQUESTING EDP. EDP [facility] REQUESTED TO SEE PT.

## 2016-08-13 NOTE — ED Notes (Signed)
ED Provider at bedside. DISCUSSED PLAN OF CARE. NEG FOR STROKE AT THIS TIME.

## 2016-08-13 NOTE — ED Notes (Signed)
Patient transported to CT 

## 2016-08-13 NOTE — ED Triage Notes (Addendum)
Pt reports HA x 1 week. Nausea and dizziness "car sickness" over the week. Left eye "pupil blown" sudden onset 1430 today. Pt stated she got out of shower and had visual changes. Denies CP, SOB, no other changes. No weakness, No slurred speech, No facial changes. Denies injuries. Observed pt ambulating back independently.

## 2016-08-13 NOTE — ED Notes (Signed)
ED Provider at bedside. 

## 2016-08-13 NOTE — ED Provider Notes (Signed)
[facility] DEPT Provider Note   CSN: [id number] Arrival date & time: 08/13/16  1625     History   Chief Complaint Chief Complaint  Patient presents with  . Blurred Vision  . Headache  . Nausea  . Dizziness    HPI Margaret Fletcher is a 46 y.o. female.  HPI  46 year old female presents with acute dilated left pupil and blurry vision. Patient states she has had some dizziness and mild to moderate headache for the last 2 days. The headache has not worsened today and at no point was ever severe. When she got out of the shower she started noticing trouble seeing out of the left eye and looked in the mirror and noticed a very dilated pupil. Her right eye has been normal. Her left eye is very blurry only. Right eye vision is normal when she closes her left eye. She has some mild pain surrounding her left eye. Denies any vomiting, weakness or numbness. She has a history of idiopathic intracranial hypertension but states that she's never had pupil changes with it. No eye trauma. has not used any drops or had any new medicines.  Past Medical History:  Diagnosis Date  . Alopecia   . Anxiety   . Autoimmune disease ([place])    "Alopecia"  . Bacterial vaginosis   . Cyst of spleen   . Depression   . Depression   . Frequent UTI   . Hypertrophy of breast   . IIH (idiopathic intracranial hypertension) 2017   "related to headaches"  . Migraine   . Migraine headache    denies, ruled out - Propranolol.Using for headaches    Patient Active Problem List   Diagnosis Date Noted  . Hypokalemia 02/22/2016  . Constipation, chronic 02/22/2016  . Chronic nausea 02/22/2016  . Epigastric pain 02/19/2016  . Bacteremia 09/12/2015  . Protein-calorie malnutrition, moderate ([place]) 08/19/2015  . Prediabetes 07/08/2015  . Morbid obesity ([place]) 07/08/2015  . Mild obstructive sleep apnea 07/08/2015  . Dyslipidemia 07/08/2015  . S/P gastric bypass 07/08/2015  . IIH (idiopathic intracranial hypertension)  06/03/2015  . Chronic migraine without aura or status migrainosus 04/30/2015  . Worsening headaches 04/30/2015  . Vision changes 04/30/2015  . Perceived hearing changes 04/30/2015  . Liver lesion 03/21/2013  . Splenic cyst 03/21/2013  . Abdominal pain 03/21/2013    Past Surgical History:  Procedure Laterality Date  . APPENDECTOMY    . BREAST REDUCTION SURGERY Bilateral 03/26/2015   Procedure: BILATERAL BREAST REDUCTION  ;  Surgeon: Youlanda Roys, MD;  Location: [facility];  Service: Plastics;  Laterality: Bilateral;  . BREATH TEK H PYLORI N/A 05/21/2014   Procedure: BREATH TEK H PYLORI;  Surgeon: Greer Pickerel, MD;  Location: [facility];  Service: General;  Laterality: N/A;  . CESAREAN SECTION    . CESAREAN SECTION    . CHOLECYSTECTOMY    . DILITATION & CURRETTAGE/HYSTROSCOPY WITH NOVASURE ABLATION N/A 07/19/2012   Procedure: HYSTEROSCOPY WITH NOVASURE ABLATION;  Surgeon: Farrel Gobble. Harrington Challenger, MD;  Location: [facility];  Service: Gynecology;  Laterality: N/A;  . ENDOMETRIAL ABLATION    . ESOPHAGOGASTRODUODENOSCOPY N/A 08/16/2015   Procedure: UPPER ESOPHAGOGASTRODUODENOSCOPY (EGD);  Surgeon: Greer Pickerel, MD;  Location: [facility];  Service: General;  Laterality: N/A;  . ESOPHAGOGASTRODUODENOSCOPY N/A 02/21/2016   Procedure: ESOPHAGOGASTRODUODENOSCOPY (EGD);  Surgeon: Greer Pickerel, MD;  Location: [facility];  Service: General;  Laterality: N/A;  . IR GENERIC HISTORICAL  10/04/2015   IR [place] Vivianne Master 10/04/2015  Markus Daft, MD [facility]  . IR GENERIC HISTORICAL  01/31/2016   IR [place] GASTRO/COLONIC TUBE PERCUT W/FLUORO 01/31/2016 Corrie Mckusick, DO [facility]  . IR GENERIC HISTORICAL  04/06/2016   IR [place] TUBE PERCUT W/FLUORO 04/06/2016 Arne Cleveland, MD [facility]  . IR GENERIC HISTORICAL  05/14/2016   IR GASTRIC TUBE PERC CHG W/O IMG GUIDE 05/14/2016 Sandi Mariscal, MD [facility]  . LAPAROSCOPIC GASTROSTOMY N/A 09/30/2015    Procedure: LAPAROSCOPIC GASTROSTOMY TUBE PLACEMENT;  Surgeon: Greer Pickerel, MD;  Location: [facility];  Service: General;  Laterality: N/A;  . LAPAROSCOPIC ROUX-EN-Y GASTRIC BYPASS WITH HIATAL HERNIA REPAIR N/A 07/08/2015   Procedure: LAPAROSCOPIC ROUX-EN-Y GASTRIC BYPASS WITH HIATAL HERNIA REPAIR WITH UPPER ENDOSCOPY;  Surgeon: Greer Pickerel, MD;  Location: [facility];  Service: General;  Laterality: N/A;  . LAPAROSCOPIC SMALL BOWEL RESECTION N/A 09/30/2015   Procedure: LAPAROSCOPIC REVISION OF ROUX LIMB;  Surgeon: Greer Pickerel, MD;  Location: [facility];  Service: General;  Laterality: N/A;  . LAPAROSCOPY N/A 09/30/2015   Procedure: LAPAROSCOPY DIAGNOSTIC;  Surgeon: Greer Pickerel, MD;  Location: [facility];  Service: General;  Laterality: N/A;  . TUBAL LIGATION    . WISDOM TOOTH EXTRACTION      OB History    No data available       Home Medications    Prior to Admission medications   Medication Sig Start Date End Date Taking? Authorizing Provider  amitriptyline (ELAVIL) 10 MG tablet Take 1 tablet (10 mg total) by mouth at bedtime. 05/25/16  Yes Melvenia Beam, MD  amphetamine-dextroamphetamine (ADDERALL) 10 MG tablet Take 1 tablet (10 mg total) by mouth 2 (two) times daily. 06/25/16  Yes Shawnee Knapp, MD  CALCIUM-VITAMIN D PO Take 15 mLs by mouth 3 (three) times daily.   Yes [provider]  cetirizine (ZYRTEC) 10 MG tablet Take 1 tablet (10 mg total) by mouth at bedtime. 05/25/16  Yes Shawnee Knapp, MD  clonazePAM (KLONOPIN) 0.5 MG tablet Take 1 tablet (0.5 mg total) by mouth 3 (three) times daily as needed for anxiety. 06/25/16  Yes Shawnee Knapp, MD  Cyanocobalamin (VITAMIN B-12) 1000 MCG/15ML LIQD Take 5 mLs by mouth daily.   Yes [provider]  docusate sodium (COLACE) 100 MG capsule Take 100 mg by mouth daily.   Yes [provider]  lactulose (CHRONULAC) 10 GM/15ML solution Take 30 g by mouth daily as needed for moderate constipation.  01/14/16  Yes [provider]    metoCLOPramide (REGLAN) 5 MG tablet Take 1 tablet (5 mg total) by mouth 4 (four) times daily -  before meals and at bedtime. 07/18/16  Yes Shawnee Knapp, MD  mirtazapine (REMERON) 30 MG tablet Take 1 tablet (30 mg total) by mouth at bedtime. 07/18/16  Yes Shawnee Knapp, MD  Multiple Vitamin (MULTIVITAMIN) LIQD Take 5 mLs by mouth 2 (two) times daily.   Yes [provider]  Nutritional Supplements (FEEDING SUPPLEMENT, VITAL HIGH PROTEIN,) LIQD liquid Place 1,000 mLs into feeding tube daily. 52ml/hr for 8hrs a day as needed for poor oral intake; 300 ml free water q4hr Patient taking differently: Place 1,000 mLs into feeding tube daily. 31ml/hr for 8hrs a day as needed for poor oral intake; 300 ml free water q4hr 02/24/16  Yes Greer Pickerel, MD  ondansetron (ZOFRAN-ODT) 4 MG disintegrating tablet Take 1 tablet (4 mg total) by mouth every 6 (six) hours as needed for nausea. 02/24/16  Yes Greer Pickerel, MD  oxyCODONE (  OXY IR/ROXICODONE) 5 MG immediate release tablet Take 1-2 tablets (5-10 mg total) by mouth every 4 (four) hours as needed for moderate pain. 06/25/16  Yes Shawnee Knapp, MD  pantoprazole (PROTONIX) 40 MG tablet Take 1 tablet (40 mg total) by mouth daily. 02/24/16  Yes Greer Pickerel, MD  polyethylene glycol [facility] / Floria Raveling) packet Take 17 g by mouth daily.   Yes [provider]  simethicone (MYLICON) 80 MG chewable tablet Chew 80 mg by mouth every 6 (six) hours as needed for flatulence.   Yes [provider]  topiramate (TOPAMAX) 50 MG tablet Take 1 tablet (50 mg total) by mouth daily. 05/25/16  Yes Melvenia Beam, MD  [name] 150-35 MCG/24HR transdermal patch Place 2 patches onto the skin once a week. 02/11/16  Yes [provider]  fluticasone (FLONASE) 50 MCG/ACT nasal spray Place 2 sprays into both nostrils at bedtime. Patient not taking: Reported on 08/13/2016 05/25/16   Shawnee Knapp, MD  hydrocortisone (ANUSOL-HC) 25 MG suppository Place 1 suppository (25 mg total)  rectally 2 (two) times daily. - after a bowel movement if possible Patient not taking: Reported on 08/13/2016 05/25/16   Shawnee Knapp, MD  ipratropium (ATROVENT) 0.03 % nasal spray Place 2 sprays into the nose 4 (four) times daily. Patient not taking: Reported on 08/13/2016 05/25/16   Shawnee Knapp, MD  linaclotide Rolan Lipa) 145 MCG CAPS capsule Take 1 capsule (145 mcg total) by mouth daily before breakfast. 02/25/16   Greer Pickerel, MD    Family History Family History  Problem Relation Age of Onset  . Cancer Mother   . Stroke Father   . Diabetes Brother   . Seizures Son   . Migraines Neg Hx     Social History Social History  Substance Use Topics  . Smoking status: Never Smoker  . Smokeless tobacco: Never Used  . Alcohol use Yes     Comment: socially     Allergies   Sulfa antibiotics and Zosyn [piperacillin sod-tazobactam so]   Review of Systems Review of Systems  Constitutional: Negative for fever.  Eyes: Positive for pain and visual disturbance.  Gastrointestinal: Negative for nausea and vomiting.  Musculoskeletal: Negative for neck pain.  Neurological: Positive for dizziness and headaches. Negative for weakness and numbness.  All other systems reviewed and are negative.    Physical Exam Updated Vital Signs BP (!) 159/96 (BP Location: Left Arm)   Pulse 95   Temp 98.6 F (37 C) (Oral)   Resp 20   Ht 5\' 8"  (1.727 m)   Wt 83.9 kg (185 lb)   SpO2 99%   BMI 28.13 kg/m   Physical Exam  Constitutional: She is oriented to person, place, and time. She appears well-developed and well-nourished.  HENT:  Head: Normocephalic and atraumatic.  Right Ear: Tympanic membrane, external ear and ear canal normal.  Left Ear: Tympanic membrane, external ear and ear canal normal.  Nose: Nose normal.  Eyes: EOM are normal. Right eye exhibits no discharge. Left eye exhibits no discharge.  No ptosis Right pupil small, reacts to light/accomodation Left pupil very dilated, does not react  to light Normal EOM  Cardiovascular: Normal rate, regular rhythm and normal heart sounds.   Pulmonary/Chest: Effort normal and breath sounds normal.  Abdominal: Soft. There is no tenderness.  Neurological: She is alert and oriented to person, place, and time.  CN 3-12 grossly intact. 5/5 strength in all 4 extremities. Grossly normal sensation. Normal finger to nose.  Skin: Skin is warm and dry.  Nursing note and vitals reviewed.    ED Treatments / Results  Labs (all labs ordered are listed, but only abnormal results are displayed) Labs Reviewed  COMPREHENSIVE METABOLIC PANEL - Abnormal; Notable for the following:       Result Value   Creatinine, Ser 1.21 (*)    Calcium 8.4 (*)    Total Protein 6.4 (*)    Albumin 3.2 (*)    GFR calc non Af Amer 53 (*)    All other components within normal limits  CBG MONITORING, ED - Abnormal; Notable for the following:    Glucose-Capillary 110 (*)    All other components within normal limits  CBC WITH DIFFERENTIAL/PLATELET  PROTIME-INR  PREGNANCY, URINE  I-STAT BETA HCG BLOOD, ED ([facility], [facility], [facility] ONLY)    EKG  EKG Interpretation None       Radiology Ct Head Wo Contrast  Result Date: 08/13/2016 CLINICAL DATA:  Patient with nausea and dizziness. Headache. Visual changes. EXAM: CT HEAD WITHOUT CONTRAST TECHNIQUE: Contiguous axial images were obtained from the base of the skull through the vertex without intravenous contrast. COMPARISON:  MRI brain 05/24/2015; CT brain 06/26/2011. FINDINGS: Brain: Ventricles and sulci are appropriate for patient's age. No evidence for acute cortically based infarct, intracranial hemorrhage, mass lesion or mass-effect. Vascular: Unremarkable. Skull: Intact. Sinuses/Orbits: Paranasal sinuses and mastoid air cells are well aerated. Orbits are unremarkable. Other: None IMPRESSION: No acute intracranial process. Electronically Signed   By: Lovey Newcomer M.D.   On: 08/13/2016 17:36    Procedures Procedures (including  critical care time)  Medications Ordered in ED Medications - No data to display   Initial Impression / Assessment and Plan / ED Course  I have reviewed the triage vital signs and the nursing notes.  Pertinent labs & imaging results that were available during my care of the patient were reviewed by me and considered in my medical decision making (see chart for details).     Patient's exam is unremarkable save for the persistently dilated left pupil. The right pupil constricts with light into both it and the left pupil. Extraocular movements are normal, no ptosis. CT obtained, no masses seen. This presentation is not consistent with idiopathic intracranial hypertension. Discussed with ophthalmology, Dr. Posey Pronto. Patient has no double vision or signs of a cranial nerve palsy, he recommends no further treatment and follow-up with him tomorrow morning. She was given his number. Discussed return precautions. 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

## 2016-08-14 ENCOUNTER — Encounter: Payer: Self-pay | Admitting: Neurology

## 2016-08-14 DIAGNOSIS — H57052 Tonic pupil, left eye: Secondary | ICD-10-CM | POA: Diagnosis not present

## 2016-08-14 DIAGNOSIS — G501 Atypical facial pain: Secondary | ICD-10-CM | POA: Diagnosis not present

## 2016-08-14 DIAGNOSIS — G932 Benign intracranial hypertension: Secondary | ICD-10-CM | POA: Diagnosis not present

## 2016-08-14 DIAGNOSIS — R51 Headache: Secondary | ICD-10-CM | POA: Diagnosis not present

## 2016-08-14 DIAGNOSIS — R42 Dizziness and giddiness: Secondary | ICD-10-CM | POA: Diagnosis not present

## 2016-08-24 ENCOUNTER — Encounter: Payer: 59 | Attending: General Surgery | Admitting: Skilled Nursing Facility1

## 2016-08-24 ENCOUNTER — Encounter: Payer: Self-pay | Admitting: Skilled Nursing Facility1

## 2016-08-24 DIAGNOSIS — Z713 Dietary counseling and surveillance: Secondary | ICD-10-CM | POA: Insufficient documentation

## 2016-08-24 NOTE — Progress Notes (Signed)
Follow-up visit: 9 months Post-Operative RYGB Surgery  Medical Nutrition Therapy:  Appt start time: 400 end time: 435  Primary concerns today: Post-operative Bariatric Surgery Nutrition Management.  Margaret Fletcher returns for a bariatric nutrition follow up post op RYGB.   1 can (8 oz) Vital High protein enteral formula overnight (237 calories, 20.7 grams protein, 5 grams fat, 26.7 grams carbohydrate, 7 oz water) Patient flushes tube with 8oz. Also using when out for the day   Pt has started a low dose of ADHD medicine and a new headache medicine which may help her with her racing thoughts. Pt states she uses peppermint oil under her lip for smells and believes it works so she can tolerate trying to eat. Pt states she needs the oil, pain medicine, and her stomach coating pill before she tries food. Pt states she has been trying new foods she once did not like before. Pt states she feels like things are better. Pt likes the PB2 smoothie mix with banana with no issue: 29 grams of carbs. Pt states she has tried chicken: no issue made very moist/shrimp: sometimes fine sometimes a tummy ache/steak if its medium it easier to tolerate due to easier to chew and softer, habachi the first time no issue the second with nausea. On days trying new foods: 12 ounces of water and 16 ounces of water. Smoothie day will take all day to drink; fairlife milk is a meal, half a premier protein and half a boiled egg. Pt states she cannot do food and liquid in the same day. Pt states butter, potato are gross now. Pt states she will be starting a work out program with a Marine scientist First week of may.Pt states she sees dr. Redmond Pulling every 2 months. Pt states she drinks soda. Pt states she does not consume artifical sweetner due to headaches. Pt thinks her intercranial hypertension is the cause of her not enjoying certain flavors anymore and why she struggles with wanting the food but not tolerating the food. Pt thinks maybe she prefers bitter  flavors than sweet flavors. Pt states she saw a mental health professional once but felt she did not get anything resolved. Pt states she knows everything moves very slow through her GI due to not having a bowel movement for a few days after a medicinal regimen involving lactulose.    Pt states she is having issues with One pupil bigger than the other: soar and achy around the eye with the larger pupil. Pt states she has Lost insurance from leaving with [place] due to disabilities.Pt states the Brighter the light the worst it is. Pt states about 15 minutes later she is vomiting up her food. Still either eat or drink and when out uses vitacal. Still attempting foods regularly. Pt states when flavors are Strong to the pallet can tolerate food later, peppermint on the lip really helps, heinz 57 on chicken makes it tolerable. Pt states she is on an appetite enhancer which is helpful and adderall is helpful with anxiety.   Surgery date: 07/08/2015 Surgery type: RYGB Start weight at [facility]: 278 lbs on 05/24/2014 Weight today: 187.2 lbs Weight change: maintained  TANITA  BODY COMP RESULTS  07/01/15 07/23/15 10/31/15 02/07/16 03/16/16 04/20/16 06/22/2016 08/24/2016   BMI (kg/m^2) 40.4 38.6 33.4 30.4 29 28.3 28.5 29.3   Fat Mass (lbs) 138.5 131.0 103.2 84 80 74 69.6 67.8   Fat Free Mass (lbs) 127.5 122.8 116.4 116.2 110.6 112.2 118 119.4   Total Body Water (lbs)  93.5 90.4 84.4 83.4 79.2 80.2 84.2 85.2    Preferred Learning Style:   No preference indicated   Learning Readiness:   Ready  Fluid intake: 24 oz water and tea + 8oz can enteral formula  Estimated total protein intake: unable to determine  Medications: see list Supplementation: chewable multi and calcium and B12  Drinking while eating: yes Hair loss: unknown, patient has alopecia Carbonated beverages: tried a Coke for nausea but did not like it: feels like the coke dissolves the food in her stomach better due to it being used in her  tube N/V/D/C: nausea and constipation Dumping syndrome: none  Recent physical activity:  Walking some  Progress Towards Goal(s):  In progress.  Handouts given during visit include:  none   Nutritional Diagnosis:  NI-2.1 Inadequate intake As related to food intolerance, nausea, and taste aversions.  As evidenced by patient requiring enteral nutrition support to meet nutrient needs.    Intervention:  Nutrition counseling provided. Praised patient on efforts to continue to increase po intake and meet nutrition needs. Discussed overnight enteral feeds via PEG tube to meet fluid and macronutrient needs. Goals: -Try MyFitness Pal -Try vinegar  -Try kefir cup in the yogurt aisle or kefir -Try poached eggs Teaching Method Utilized:  Visual Auditory Hands on  Barriers to learning/adherence to lifestyle change: nausea and inability to meet nutrition needs orally  Demonstrated degree of understanding via:  Teach Back   Monitoring/Evaluation:  Dietary intake, exercise, and body weight. Follow up in 4 weeks.

## 2016-08-24 NOTE — Patient Instructions (Addendum)
-  Try MyFitness Pal  -Try vinegar   -Try kefir cup in the yogurt aisle or kefir

## 2016-08-31 DIAGNOSIS — L910 Hypertrophic scar: Secondary | ICD-10-CM | POA: Diagnosis not present

## 2016-09-02 MED FILL — AMITRIPTYLINE HCL 10 MG TAB: 10 | 30 days supply | Qty: 30 | Fill #2

## 2016-09-14 ENCOUNTER — Ambulatory Visit (INDEPENDENT_AMBULATORY_CARE_PROVIDER_SITE_OTHER): Payer: Self-pay | Admitting: Neurology

## 2016-09-14 ENCOUNTER — Encounter: Payer: Self-pay | Admitting: Neurology

## 2016-09-14 ENCOUNTER — Other Ambulatory Visit: Payer: Self-pay | Admitting: Neurology

## 2016-09-14 VITALS — BP 118/89 | HR 64 | Ht 68.0 in | Wt 181.4 lb

## 2016-09-14 DIAGNOSIS — H57052 Tonic pupil, left eye: Secondary | ICD-10-CM

## 2016-09-14 DIAGNOSIS — G932 Benign intracranial hypertension: Secondary | ICD-10-CM

## 2016-09-14 MED ORDER — AMITRIPTYLINE HCL 10 MG PO TABS: 20.0000 mg | ORAL_TABLET | Freq: Every day | ORAL | 11 refills | Status: DC

## 2016-09-14 NOTE — Progress Notes (Signed)
[facility]    Provider:  Dr Jaynee Eagles Referring Provider: Shawnee Knapp, MD Primary Care Physician:  Shawnee Knapp, MD  CC:  IIH  HPI:  Margaret Fletcher is a 46 y.o. female here as a referral from Dr. Brigitte Pulse for dilated pupil. She was diagnosed with Adie's eye. She had a headache, some headache, ear congestion and then noticed that her pupil was dilated. Her eye gets sore even now. She has lost 120 pounds. Her headaches are better. It is hard to eat. She was denied for disability. Itis in appeal. She applied for medicaid. She wears sunglasses. She is having blurry vision in the eye. The left eye is blurry. She was evaluated by ophthalmology. Going outside is hard. No improvement. She takes 150mg  Topiramate daily. She is still having headaches every day, she takes eletriptan, She takes the eletriptan when the headaches are migrainous. She has daily headaches that last all day long. She is under a lot of stress, headaches better when it is dark, the headache feels like eye strain.   Reviewed notes, labs and imaging from outside physicians, which showed:    CT head showed No acute intracranial abnormalities including mass lesion or mass effect, hydrocephalus, extra-axial fluid collection, midline shift, hemorrhage, or acute infarction, large ischemic events (personally reviewed images)   hgba1c 5.3, RPR NR   Review of Systems: Patient complains of symptoms per HPI as well as the following symptoms:no CP, no SOB. Pertinent negatives and positives per HPI. All others negative.   Social History   Social History  . Marital status: Divorced    Spouse name: N/A  . Number of children: 2  . Years of education: 16   Occupational History  . [employer]- nurse tech    Social History Main Topics  . Smoking status: Never Smoker  . Smokeless tobacco: Never Used  . Alcohol use Yes     Comment: socially  . Drug use: No  . Sexual activity: Yes    Birth control/ protection:  Surgical   Other Topics Concern  . Not on file   Social History Narrative   Lives with partner   Caffeine use: minimal coffee   Right-handed    Family History  Problem Relation Age of Onset  . Cancer Mother   . Stroke Father   . Diabetes Brother   . Seizures Son   . Migraines Neg Hx     Past Medical History:  Diagnosis Date  . Alopecia   . Anxiety   . Autoimmune disease ([place])    "Alopecia"  . Bacterial vaginosis   . Cyst of spleen   . Depression   . Frequent UTI   . Hypertrophy of breast   . IIH (idiopathic intracranial hypertension) 2017   "related to headaches"  . Migraine headache    denies, ruled out - Propranolol.Using for headaches    Past Surgical History:  Procedure Laterality Date  . APPENDECTOMY    . BREAST REDUCTION SURGERY Bilateral 03/26/2015   Procedure: BILATERAL BREAST REDUCTION  ;  Surgeon: Youlanda Roys, MD;  Location: [facility];  Service: Plastics;  Laterality: Bilateral;  . BREATH TEK H PYLORI N/A 05/21/2014   Procedure: BREATH TEK H PYLORI;  Surgeon: Greer Pickerel, MD;  Location: [facility];  Service: General;  Laterality: N/A;  . CESAREAN SECTION    . CESAREAN SECTION    . CHOLECYSTECTOMY    . DILITATION & CURRETTAGE/HYSTROSCOPY WITH NOVASURE ABLATION N/A 07/19/2012  Procedure: HYSTEROSCOPY WITH NOVASURE ABLATION;  Surgeon: Farrel Gobble. Harrington Challenger, MD;  Location: [facility];  Service: Gynecology;  Laterality: N/A;  . ENDOMETRIAL ABLATION    . ESOPHAGOGASTRODUODENOSCOPY N/A 08/16/2015   Procedure: UPPER ESOPHAGOGASTRODUODENOSCOPY (EGD);  Surgeon: Greer Pickerel, MD;  Location: [facility];  Service: General;  Laterality: N/A;  . ESOPHAGOGASTRODUODENOSCOPY N/A 02/21/2016   Procedure: ESOPHAGOGASTRODUODENOSCOPY (EGD);  Surgeon: Greer Pickerel, MD;  [facility];  Service: General;  Laterality: N/A;  . IR GENERIC HISTORICAL  10/04/2015   IR [place] Vivianne Master 10/04/2015 Markus Daft, MD [facility]  . IR GENERIC  HISTORICAL  01/31/2016   IR [place] GASTRO/COLONIC TUBE PERCUT W/FLUORO 01/31/2016 Corrie Mckusick, DO [facility]  . IR GENERIC HISTORICAL  04/06/2016   IR [place] TUBE PERCUT W/FLUORO 04/06/2016 Arne Cleveland, MD [facility]  . IR GENERIC HISTORICAL  05/14/2016   IR GASTRIC TUBE PERC CHG W/O IMG GUIDE 05/14/2016 Sandi Mariscal, MD [facility]  . LAPAROSCOPIC GASTROSTOMY N/A 09/30/2015   Procedure: LAPAROSCOPIC GASTROSTOMY TUBE PLACEMENT;  Surgeon: Greer Pickerel, MD;  Location: [facility];  Service: General;  Laterality: N/A;  . LAPAROSCOPIC ROUX-EN-Y GASTRIC BYPASS WITH HIATAL HERNIA REPAIR N/A 07/08/2015   Procedure: LAPAROSCOPIC ROUX-EN-Y GASTRIC BYPASS WITH HIATAL HERNIA REPAIR WITH UPPER ENDOSCOPY;  Surgeon: Greer Pickerel, MD;  Location: [facility];  Service: General;  Laterality: N/A;  . LAPAROSCOPIC SMALL BOWEL RESECTION N/A 09/30/2015   Procedure: LAPAROSCOPIC REVISION OF ROUX LIMB;  Surgeon: Greer Pickerel, MD;  Location: [facility];  Service: General;  Laterality: N/A;  . LAPAROSCOPY N/A 09/30/2015   Procedure: LAPAROSCOPY DIAGNOSTIC;  Surgeon: Greer Pickerel, MD;  Location: [facility];  Service: General;  Laterality: N/A;  . TUBAL LIGATION    . WISDOM TOOTH EXTRACTION      Current Outpatient Prescriptions  Medication Sig Dispense Refill  . amitriptyline (ELAVIL) 10 MG tablet Take 1 tablet (10 mg total) by mouth at bedtime. 30 tablet 11  . amphetamine-dextroamphetamine (ADDERALL) 10 MG tablet Take 1 tablet (10 mg total) by mouth 2 (two) times daily. 60 tablet 0  . CALCIUM-VITAMIN D PO Take by mouth 3 (three) times daily. chewable    . cetirizine (ZYRTEC) 10 MG tablet Take 1 tablet (10 mg total) by mouth at bedtime. 30 tablet 11  . clonazePAM (KLONOPIN) 0.5 MG tablet Take 1 tablet (0.5 mg total) by mouth 3 (three) times daily as needed for anxiety. 90 tablet 0  . Cyanocobalamin (VITAMIN B-12) 1000 MCG/15ML LIQD Take 5 mLs by mouth daily.    Marland Kitchen docusate sodium (COLACE) 100 MG capsule Take 100 mg by mouth  daily.    . fluticasone (FLONASE) 50 MCG/ACT nasal spray Place 2 sprays into both nostrils at bedtime. 16 g 2  . hydrocortisone (ANUSOL-HC) 25 MG suppository Place 1 suppository (25 mg total) rectally 2 (two) times daily. - after a bowel movement if possible (Patient taking differently: Place 25 mg rectally 2 (two) times daily as needed. - after a bowel movement if possible) 24 suppository 1  . ipratropium (ATROVENT) 0.03 % nasal spray Place 2 sprays into the nose 4 (four) times daily. 30 mL 1  . lactulose (CHRONULAC) 10 GM/15ML solution Take 30 g by mouth daily as needed for moderate constipation.   0  . linaclotide (LINZESS) 145 MCG CAPS capsule Take 1 capsule (145 mcg total) by mouth daily before breakfast. 30 capsule 0  . metoCLOPramide (REGLAN) 5 MG tablet Take 1 tablet (5 mg total) by mouth 4 (four) times daily -  before meals and at bedtime. 120 tablet 0  . mirtazapine (REMERON) 30 MG tablet Take 1 tablet (30 mg total) by mouth at bedtime. 30 tablet 1  . Multiple Vitamin (MULTIVITAMIN) LIQD Take by mouth 2 (two) times daily. chewable    . Nutritional Supplements (FEEDING SUPPLEMENT, VITAL HIGH PROTEIN,) LIQD liquid Place 1,000 mLs into feeding tube daily. 75ml/hr for 8hrs a day as needed for poor oral intake; 300 ml free water q4hr (Patient taking differently: Place 1,000 mLs into feeding tube daily. 82ml/hr for 8hrs a day as needed for poor oral intake; 300 ml free water q4hr) 1000 mL 8  . ondansetron (ZOFRAN-ODT) 4 MG disintegrating tablet Take 1 tablet (4 mg total) by mouth every 6 (six) hours as needed for nausea. 30 tablet 0  . oxyCODONE (OXY IR/ROXICODONE) 5 MG immediate release tablet Take 1-2 tablets (5-10 mg total) by mouth every 4 (four) hours as needed for moderate pain. (Patient taking differently: Take 5-10 mg by mouth every 4 (four) hours as needed for moderate pain. Takes 2.5 mg prn) 60 tablet 0  . pantoprazole (PROTONIX) 40 MG tablet Take 1 tablet (40 mg total) by mouth daily. 30  tablet 1  . polyethylene glycol (MIRALAX / GLYCOLAX) packet Take 17 g by mouth daily.    . simethicone (MYLICON) 80 MG chewable tablet Chew 80 mg by mouth every 6 (six) hours as needed for flatulence.    . topiramate (TOPAMAX) 50 MG tablet Take 1 tablet (50 mg total) by mouth daily. (Patient taking differently: Take 150 mg by mouth daily. ) 30 tablet 11  . XULANE 150-35 MCG/24HR transdermal patch Place 1 patch onto the skin once a week.   11   No current facility-administered medications for this visit.     Allergies as of 09/14/2016 - Review Complete 09/14/2016  Allergen Reaction Noted  . Sulfa antibiotics Anaphylaxis 01/18/2011  . Zosyn [piperacillin sod-tazobactam so] Rash 09/12/2015    Vitals: BP 118/89   Pulse 64   Ht 5\' 8"  (1.727 m)   Wt 181 lb 6.4 oz (82.3 kg)   BMI 27.58 kg/m  Last Weight:  Wt Readings from Last 1 Encounters:  09/14/16 181 lb 6.4 oz (82.3 kg)   Last Height:   Ht Readings from Last 1 Encounters:  09/14/16 5\' 8"  (1.727 m)    Physical exam: Exam: Gen: NAD, conversant, well nourised, obese, well groomed                     CV: RRR, no MRG. No Carotid Bruits. No peripheral edema, warm, nontender Eyes: Conjunctivae clear without exudates or hemorrhage  Neuro: Detailed Neurologic Exam  Speech:    Speech is normal; fluent and spontaneous with normal comprehension.  Cognition:    The patient is oriented to person, place, and time;     recent and remote memory intact;     language fluent;     normal attention, concentration,     fund of knowledge Cranial Nerves:  Right 43mm, left 11mm, Tonic left pupil  The fundi are normal and spontaneous venous pulsations are present. Visual fields are full to finger confrontation. Extraocular movements are intact. Trigeminal sensation is intact and the muscles of mastication are normal. The face is symmetric. The palate elevates in the midline. Hearing intact. Voice is normal. Shoulder shrug is normal. The tongue  has normal motion without fasciculations.   Coordination:    Normal finger to nose and heel to shin. Normal rapid alternating  movements.   Gait:    Heel-toe and tandem gait are normal.   Motor Observation:    No asymmetry, no atrophy, and no involuntary movements noted. Tone:    Normal muscle tone.    Posture:    Posture is normal. normal erect    Strength:    Strength is V/V in the upper and lower limbs.      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 CT head nml. Will follow clinically and repeat MRI of the brain if clinically indicated. IIH stable.  Sarina Ill, MD  [facility] [address] [place], [place] [address]  Phone [phone] Fax [phone]  A total of 25 minutes was spent face-to-face with this patient. Over half this time was spent on counseling patient on the Adie Tonic Pupil, IIH diagnosis and different diagnostic and therapeutic options available.

## 2016-09-14 NOTE — Progress Notes (Signed)
Added RX

## 2016-09-14 NOTE — Patient Instructions (Signed)
Remember to drink plenty of fluid, eat healthy meals and do not skip any meals. Try to eat protein with a every meal and eat a healthy snack such as fruit or nuts in between meals. Try to keep a regular sleep-wake schedule and try to exercise daily, particularly in the form of walking, 20-30 minutes a day, if you can.   As far as your medications are concerned, I would like to suggest: Amitriptyline 20mg  at bedtime  I would like to see you back in 4-6 months, sooner if we need to. Please call us with any interim questions, concerns, problems, updates or refill requests.   Our phone number is [phone]. We also have an after hours call service for urgent matters and there is a physician on-call for urgent questions. For any emergencies you know to call 911 or go to the nearest emergency room

## 2016-09-21 ENCOUNTER — Other Ambulatory Visit: Payer: Self-pay | Admitting: Family Medicine

## 2016-09-21 ENCOUNTER — Encounter: Payer: Self-pay | Admitting: Family Medicine

## 2016-09-25 NOTE — Telephone Encounter (Signed)
Pt has requested rx refill, she is traveling out of town and is hoping that rx can be fax if at all possible while she is visiting [place]  [facility]  [phone] please call

## 2016-09-26 NOTE — Telephone Encounter (Signed)
NO faxing- ADDERALL is a controlled level II (same level as oxycodone) so we cannot fax it or call it. (some offices have specialized software to allow this but [facility] does not). Pt has to pick up paper rx or she can have someone else pick it up on her behalf as long as they give their license and ID to be recorded so we know who has the prescription. I think someone else can also fill it for her and mail it to her if there is no other way (though I would be very worried about it being "lost" in the mail if I were her. . . ).  If she is in-state, we can mail the prescription to her and she can fill it where she is. I'm not sure if these scripts can be filled out of state, it might be up to the pharmacist discretion but I would depend upon it.

## 2016-09-28 MED ORDER — AMPHETAMINE-DEXTROAMPHETAMINE 10 MG PO TABS: 10.0000 mg | ORAL_TABLET | Freq: Two times a day (BID) | ORAL | 0 refills | Status: DC

## 2016-09-28 MED FILL — DEXTROAMP-AMP 10 MG TAB: 10 | 30 days supply | Qty: 60 | Fill #0

## 2016-09-28 MED FILL — AMITRIPTYLINE HCL 10 MG TAB: 10 | 30 days supply | Qty: 60 | Fill #0

## 2016-09-29 NOTE — Telephone Encounter (Signed)
Left message Adderral rx is ready for pick up

## 2016-10-12 ENCOUNTER — Encounter: Payer: Self-pay | Admitting: Family Medicine

## 2016-10-12 ENCOUNTER — Ambulatory Visit (INDEPENDENT_AMBULATORY_CARE_PROVIDER_SITE_OTHER): Payer: BLUE CROSS/BLUE SHIELD | Admitting: Family Medicine

## 2016-10-12 VITALS — BP 121/75 | HR 61 | Temp 98.7°F | Resp 18 | Ht 68.0 in | Wt 175.7 lb

## 2016-10-12 DIAGNOSIS — G4489 Other headache syndrome: Secondary | ICD-10-CM | POA: Diagnosis not present

## 2016-10-12 DIAGNOSIS — R110 Nausea: Secondary | ICD-10-CM

## 2016-10-12 DIAGNOSIS — K5909 Other constipation: Secondary | ICD-10-CM | POA: Diagnosis not present

## 2016-10-12 DIAGNOSIS — R5381 Other malaise: Secondary | ICD-10-CM

## 2016-10-12 DIAGNOSIS — Z931 Gastrostomy status: Secondary | ICD-10-CM

## 2016-10-12 DIAGNOSIS — H539 Unspecified visual disturbance: Secondary | ICD-10-CM | POA: Diagnosis not present

## 2016-10-12 DIAGNOSIS — G932 Benign intracranial hypertension: Secondary | ICD-10-CM

## 2016-10-12 DIAGNOSIS — R413 Other amnesia: Secondary | ICD-10-CM | POA: Diagnosis not present

## 2016-10-12 DIAGNOSIS — R1084 Generalized abdominal pain: Secondary | ICD-10-CM | POA: Diagnosis not present

## 2016-10-12 DIAGNOSIS — R252 Cramp and spasm: Secondary | ICD-10-CM | POA: Diagnosis not present

## 2016-10-12 DIAGNOSIS — K529 Noninfective gastroenteritis and colitis, unspecified: Secondary | ICD-10-CM

## 2016-10-12 DIAGNOSIS — F4323 Adjustment disorder with mixed anxiety and depressed mood: Secondary | ICD-10-CM | POA: Diagnosis not present

## 2016-10-12 NOTE — Progress Notes (Signed)
Subjective:    Patient ID: Margaret Fletcher, female    DOB: 09-15-1970, 46 y.o.   MRN: [id number] Chief Complaint  Patient presents with  . Other    needs a specific note for DSS with more detail on medical diagnoses.    HPI  Dr. Jaynee Eagles was fine with the stimulant therapy - adderall - didn't think it would make her intracranial HTN worse though she needs another MRI as her left eye is dilated and not responding to light. This is causing significant photophobia and worsening headaches. She is also unable to drive now as she has very poor depth perception. Unable to proceed with MRI until she regains health insurance which she is hoping will be very soon.  Lots of muscle cramps.  Trying mustard for these.  She is taking the adderall 1 bid which works alright.  Mood has been slightly less anxious but is dependent upon the day - she gets more anxious when she thinks about food and her eating patterns.  She has had more nausea over the past 3 months and eating less.   Has had bouts of diarrhea so not taking the linzess as pt reports that her surgeon told her to try it prn.  Taking the reglan once a day without effect.  SHe also came off the remeron after a month but came off of it after that as she didn't notice her appetite increase at all.   SHe has been feel worse over the past month with more diarrhea and decreased appetite which she noted after nausea and a foamy mouth (her version of emesis) after a trip to [place]. Having more early satiety and bloating since then with continued intermittent flairs. Mainly eating bananas.   Her initial disability was rejected and now the first appeal is pending.  Past Medical History:  Diagnosis Date  . Alopecia   . Anxiety   . Autoimmune disease ([place])    "Alopecia"  . Bacterial vaginosis   . Cyst of spleen   . Depression   . Frequent UTI   . Hypertrophy of breast   . IIH (idiopathic intracranial hypertension) 2017   "related to headaches"  .  Migraine headache    denies, ruled out - Propranolol.Using for headaches   Past Surgical History:  Procedure Laterality Date  . APPENDECTOMY    . BREAST REDUCTION SURGERY Bilateral 03/26/2015   Procedure: BILATERAL BREAST REDUCTION  ;  Surgeon: Youlanda Roys, MD;  Location: [facility];  Service: Plastics;  Laterality: Bilateral;  . BREATH TEK H PYLORI N/A 05/21/2014   Procedure: BREATH TEK H PYLORI;  Surgeon: Greer Pickerel, MD;  Location: [facility];  Service: General;  Laterality: N/A;  . CESAREAN SECTION    . CESAREAN SECTION    . CHOLECYSTECTOMY    . DILITATION & CURRETTAGE/HYSTROSCOPY WITH NOVASURE ABLATION N/A 07/19/2012   Procedure: HYSTEROSCOPY WITH NOVASURE ABLATION;  Surgeon: Farrel Gobble. Harrington Challenger, MD;  Location: [facility];  Service: Gynecology;  Laterality: N/A;  . ENDOMETRIAL ABLATION    . ESOPHAGOGASTRODUODENOSCOPY N/A 08/16/2015   Procedure: UPPER ESOPHAGOGASTRODUODENOSCOPY (EGD);  Surgeon: Greer Pickerel, MD;  Location: [facility];  Service: General;  Laterality: N/A;  . ESOPHAGOGASTRODUODENOSCOPY N/A 02/21/2016   Procedure: ESOPHAGOGASTRODUODENOSCOPY (EGD);  Surgeon: Greer Pickerel, MD;  Location: [facility];  Service: General;  Laterality: N/A;  . IR GENERIC HISTORICAL  10/04/2015   IR [place] Vivianne Master 10/04/2015 Markus Daft, MD [facility]  . IR GENERIC HISTORICAL  01/31/2016   IR [place] GASTRO/COLONIC TUBE PERCUT W/FLUORO 01/31/2016 Corrie Mckusick, DO [facility]  . IR GENERIC HISTORICAL  04/06/2016   IR [place] TUBE PERCUT W/FLUORO 04/06/2016 Arne Cleveland, MD [facility]  . IR GENERIC HISTORICAL  05/14/2016   IR GASTRIC TUBE PERC CHG W/O IMG GUIDE 05/14/2016 Sandi Mariscal, MD [facility]  . LAPAROSCOPIC GASTROSTOMY N/A 09/30/2015   Procedure: LAPAROSCOPIC GASTROSTOMY TUBE PLACEMENT;  Surgeon: Greer Pickerel, MD;  Location: [facility];  Service: General;  Laterality: N/A;  . LAPAROSCOPIC ROUX-EN-Y GASTRIC BYPASS WITH HIATAL HERNIA REPAIR  N/A 07/08/2015   Procedure: LAPAROSCOPIC ROUX-EN-Y GASTRIC BYPASS WITH HIATAL HERNIA REPAIR WITH UPPER ENDOSCOPY;  Surgeon: Greer Pickerel, MD;  Location: [facility];  Service: General;  Laterality: N/A;  . LAPAROSCOPIC SMALL BOWEL RESECTION N/A 09/30/2015   Procedure: LAPAROSCOPIC REVISION OF ROUX LIMB;  Surgeon: Greer Pickerel, MD;  Location: [facility];  Service: General;  Laterality: N/A;  . LAPAROSCOPY N/A 09/30/2015   Procedure: LAPAROSCOPY DIAGNOSTIC;  Surgeon: Greer Pickerel, MD;  Location: [facility];  Service: General;  Laterality: N/A;  . TUBAL LIGATION    . WISDOM TOOTH EXTRACTION     Current Outpatient Prescriptions on File Prior to Visit  Medication Sig Dispense Refill  . amitriptyline (ELAVIL) 10 MG tablet Take 2 tablets (20 mg total) by mouth at bedtime. 60 tablet 11  . amphetamine-dextroamphetamine (ADDERALL) 10 MG tablet Take 1 tablet (10 mg total) by mouth 2 (two) times daily. 60 tablet 0  . CALCIUM-VITAMIN D PO Take by mouth 3 (three) times daily. chewable    . cetirizine (ZYRTEC) 10 MG tablet Take 1 tablet (10 mg total) by mouth at bedtime. 30 tablet 11  . clonazePAM (KLONOPIN) 0.5 MG tablet Take 1 tablet (0.5 mg total) by mouth 3 (three) times daily as needed for anxiety. 90 tablet 0  . Cyanocobalamin (VITAMIN B-12) 1000 MCG/15ML LIQD Take 5 mLs by mouth daily.    Marland Kitchen docusate sodium (COLACE) 100 MG capsule Take 100 mg by mouth daily.    Marland Kitchen eletriptan (RELPAX) 40 MG tablet   11  . fluticasone (FLONASE) 50 MCG/ACT nasal spray Place 2 sprays into both nostrils at bedtime. 16 g 2  . hydrocortisone (ANUSOL-HC) 25 MG suppository Place 1 suppository (25 mg total) rectally 2 (two) times daily. - after a bowel movement if possible (Patient taking differently: Place 25 mg rectally 2 (two) times daily as needed. - after a bowel movement if possible) 24 suppository 1  . ipratropium (ATROVENT) 0.03 % nasal spray Place 2 sprays into the nose 4 (four) times daily. 30 mL 1  . lactulose (CHRONULAC) 10 GM/15ML  solution Take 30 g by mouth daily as needed for moderate constipation.   0  . linaclotide (LINZESS) 145 MCG CAPS capsule Take 1 capsule (145 mcg total) by mouth daily before breakfast. 30 capsule 0  . Multiple Vitamin (MULTIVITAMIN) LIQD Take by mouth 2 (two) times daily. chewable    . Nutritional Supplements (FEEDING SUPPLEMENT, VITAL HIGH PROTEIN,) LIQD liquid Place 1,000 mLs into feeding tube daily. 46ml/hr for 8hrs a day as needed for poor oral intake; 300 ml free water q4hr (Patient taking differently: Place 1,000 mLs into feeding tube daily. 32ml/hr for 8hrs a day as needed for poor oral intake; 300 ml free water q4hr) 1000 mL 8  . ondansetron (ZOFRAN-ODT) 4 MG disintegrating tablet Take 1 tablet (4 mg total) by mouth every 6 (six) hours as needed for nausea. 30 tablet 0  . oxyCODONE (OXY  IR/ROXICODONE) 5 MG immediate release tablet Take 1-2 tablets (5-10 mg total) by mouth every 4 (four) hours as needed for moderate pain. (Patient taking differently: Take 5-10 mg by mouth every 4 (four) hours as needed for moderate pain. Takes 2.5 mg prn) 60 tablet 0  . pantoprazole (PROTONIX) 40 MG tablet Take 1 tablet (40 mg total) by mouth daily. 30 tablet 1  . polyethylene glycol (MIRALAX / GLYCOLAX) packet Take 17 g by mouth daily.    . simethicone (MYLICON) 80 MG chewable tablet Chew 80 mg by mouth every 6 (six) hours as needed for flatulence.    . topiramate (TOPAMAX) 50 MG tablet Take 1 tablet (50 mg total) by mouth daily. (Patient taking differently: Take 150 mg by mouth daily. ) 30 tablet 11  . XULANE 150-35 MCG/24HR transdermal patch Place 1 patch onto the skin once a week.   11   No current facility-administered medications on file prior to visit.    Allergies  Allergen Reactions  . Sulfa Antibiotics Anaphylaxis  . Zosyn [Piperacillin Sod-Tazobactam So] Rash   Family History  Problem Relation Age of Onset  . Cancer Mother   . Stroke Father   . Diabetes Brother   . Seizures Son   .  Migraines Neg Hx    Social History   Social History  . Marital status: Divorced    Spouse name: N/A  . Number of children: 2  . Years of education: 16   Occupational History  . [employer]- nurse tech    Social History Main Topics  . Smoking status: Never Smoker  . Smokeless tobacco: Never Used  . Alcohol use Yes     Comment: socially  . Drug use: No  . Sexual activity: Yes    Birth control/ protection: Surgical   Other Topics Concern  . None   Social History Narrative   Lives with partner   Caffeine use: minimal coffee   Right-handed   Depression screen [facility] 2/9 10/12/2016 07/18/2016 06/25/2016 06/11/2016 05/25/2016  Decreased Interest 0 0 1 0 0  Down, Depressed, Hopeless 0 1 1 0 0  PHQ - 2 Score 0 1 2 0 0  Altered sleeping - - 1 - -  Tired, decreased energy - - 1 - -  Change in appetite - - 3 - -  Feeling bad or failure about yourself  - - 1 - -  Trouble concentrating - - 1 - -  Moving slowly or fidgety/restless - - 1 - -  Suicidal thoughts - - 0 - -  PHQ-9 Score - - 10 - -  Difficult doing work/chores - - Somewhat difficult - -    Review of Systems See hpi    Objective:   Physical Exam  Constitutional: She is oriented to person, place, and time. She appears well-developed and well-nourished. No distress.  HENT:  Head: Normocephalic and atraumatic.  Right Ear: External ear normal.  Left Ear: External ear normal.  Eyes: Conjunctivae are normal. No scleral icterus.  Neck: Normal range of motion. Neck supple. No thyromegaly present.  Cardiovascular: Normal rate, regular rhythm, normal heart sounds and intact distal pulses.   Pulmonary/Chest: Effort normal and breath sounds normal. No respiratory distress.  Musculoskeletal: She exhibits no edema.  Lymphadenopathy:    She has no cervical adenopathy.  Neurological: She is alert and oriented to person, place, and time.  Skin: Skin is warm and dry. She is not diaphoretic. No erythema.  Psychiatric: Her affect is  blunt.  Her speech is delayed. She is slowed.      BP 121/75   Pulse 61   Temp 98.7 F (37.1 C) (Oral)   Resp 18   Ht 5\' 8"  (1.727 m)   Wt 175 lb 11.2 oz (79.7 kg)   SpO2 98%   BMI 26.72 kg/m   Assessment & Plan:   1. Generalized abdominal pain   2. Memory loss due to medical condition - started on adderall earlier this yr which is helping a little. Pt reports neurology did not think that this stimulant therapy would adversely effect the IIH dz process.  Ok to refill adderall 10 bid x 3 mos when requested.   3. IIH (idiopathic intracranial hypertension)   4. G tube feedings ([place])   5. Adjustment disorder with mixed anxiety and depressed mood - try to wean of klonopin since on stimulant  6. Other headache syndrome - cont elavil 20mg  qhs per neurology  7. Vision changes - needs MRI - blown left pupil, seen by neuro and waiting for health insurance.   8. Chronic nausea - also with chronic constipation and severe abdominal cramping after eating requiring oxycodone so I recommended patient try on Reglan 5 mg 4 times a day. However she only took this once a day and reports no benefit but I do not consider this and actual trial, pt declines to retry this med (retry while taking as rx'ed). No appetite benefit noted from remeron but again pt did not give full trial as stopped within the mo (on 30mg  dose).  9. Debility - Have strongly encouraged patient to apply for disability and she is currently in the midst of this. Wrote letter today (under communications) to support ongoing need for food stamps while her disability is still in the application stage.   10.    Chronic constipation - linzess prn as alternates with diarrhea per surgery (Dr. Greer Pickerel). 11.    Muscle cramps - could be due to electrolyte loss with heat and chronic diarrhea. Rec pt obtain labs - bmp, mg, phos but pt declines today - she thinks her health ins is going to start retroactively but has not kicked in yet and as unable to  work due to medical disability that has not yet been awarded she is VERY hesitant to proceed with any further health care costs. 12.    Chronic diarrhea - Not currently on linzess. Certainly most likely due to surgical GI abnml and tube feeds but pt w/ only constipation prior to sev mos ago when she distinctly remembers dev acute gastroenteritis sxs after eating a salad at a restaurant and sxs have not resolved since so rec stool sample to r/o GI pathogens, esp c. Diff but pt declines today, plans to discuss w/ surgeon.  [name], M.D.  Primary Care at [facility] [address] [place], [place] [address] [phone] phone [phone] fax  10/13/16 4:31 PM

## 2016-10-13 DIAGNOSIS — F4323 Adjustment disorder with mixed anxiety and depressed mood: Secondary | ICD-10-CM | POA: Insufficient documentation

## 2016-10-23 MED FILL — XULANE PATCH: 150-35 | 21 days supply | Qty: 3 | Fill #7

## 2016-10-25 NOTE — Progress Notes (Signed)
Subjective:    Patient ID: Margaret Fletcher, female    DOB: [birth year], 46 y.o.   MRN: [id number] Chief Complaint  Patient presents with  . Labs    pt states that leg cramps are not has bad as they were. She states good and bad days.  . Depression    Depression scale score 16  . Medication Refill    Linzess 145 MCG, Oxycodone 5 MG    HPI Memory loss/Poor concentration: sig worsened since mul med issues - IIH and then digestive issues. Started on adderall 10 bid earlier this yr which is helping a little. Pt reports neurology did not think that this stimulant therapy would adversely effect the IIH dz process.  The adderall also helps her feel like she is not as antsy or figidty so she takes one in the morning and one in the afternoon. The adderall does help her get more stuff done in the morning - helps her focus and think through things but then as her topamax has been increased to 170m which gives her a brain fog.   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 Follows closely with neurology Dr. AJaynee Eagles On elavil 24mqhs. 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 Causing sig photophobia and worsening HAs and now unable to drive due to lack of depth perception.   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 Failed trial of reglan but only would take 55m44md. Failed trial of remeron 78m88mt did not take for a whole mo.  Today she is having worse head pain.  Chronic constipation predominately alt w/ diarrhea - linzess prn as alternates with diarrhea per surgery (Dr. EricGreer Pickerelowever, sev mos ago, she developed intractable diarrhea after acute gastroenteritis after eating a salad at a restaurant. (so of course off the  linzess). Has had more nausea and more trouble eating over past 3 mos - worsening early satiety and bloating with intermittent flairs.  For the past few weeks, some normalcy - just less diarrhea.  Muscle cramps worsening over past sev mos. Tried mustard supplements.  Adjustment d/o w/ mixed anxiety/depression: since developed debilitating/consuming disease/complications.  Has been on bzd - klonopin - which she is trying to wean off since she has been started on a stimulant.  Sometimes she feels like she "needs something during the day." Is sad today as she feels bad, her eating hasn't been as well, that she is still sick and life is going on and she is not better where she is supposed to be. She is taking klonopin 0.55mg 70m and 0.255mg 37m She does think it helps - feels not as antsy or figidty - like she can take a deep breath.  She takes the amitriptyline and a klonopin around 8 pm in which case she can sleep all night without feeling draggy the next morning.    CKDIII: Last eGFR 53. Cr 1.21; + proteinuria. Hypocalcemia 8.4 at labs 2.5 mo ago with total prot and alb slightly low.  Vit D def: Lab Results  Component Value Date   VD25OH 21.4 (L) 07/02/2015   VD25OH 20 (L) 04/25/2015   She does takes 1 dropper full of vit B daily - is in the bariatric  vitamins/supp she gets form [facility].  Past Medical History:  Diagnosis Date  . Alopecia   . Anxiety   . Autoimmune disease ([place])    "Alopecia"  . Bacterial vaginosis   . Cyst of spleen   . Depression   . Frequent UTI   . Hypertrophy of breast   . IIH (idiopathic intracranial hypertension) 2017   "related to headaches"  . Migraine headache    denies, ruled out - Propranolol.Using for headaches   Past Surgical History:  Procedure Laterality Date  . APPENDECTOMY    . BREAST REDUCTION SURGERY Bilateral 03/26/2015   Procedure: BILATERAL BREAST REDUCTION  ;  Surgeon: Youlanda Roys, MD;  Location: [facility];   Service: Plastics;  Laterality: Bilateral;  . BREATH TEK H PYLORI N/A 05/21/2014   Procedure: BREATH TEK H PYLORI;  Surgeon: Greer Pickerel, MD;  Location: [facility];  Service: General;  Laterality: N/A;  . CESAREAN SECTION    . CESAREAN SECTION    . CHOLECYSTECTOMY    . DILITATION & CURRETTAGE/HYSTROSCOPY WITH NOVASURE ABLATION N/A 07/19/2012   Procedure: HYSTEROSCOPY WITH NOVASURE ABLATION;  Surgeon: Farrel Gobble. Harrington Challenger, MD;  Location: [facility];  Service: Gynecology;  Laterality: N/A;  . ENDOMETRIAL ABLATION    . ESOPHAGOGASTRODUODENOSCOPY N/A 08/16/2015   Procedure: UPPER ESOPHAGOGASTRODUODENOSCOPY (EGD);  Surgeon: Greer Pickerel, MD;  Location: [facility];  Service: General;  Laterality: N/A;  . ESOPHAGOGASTRODUODENOSCOPY N/A 02/21/2016   Procedure: ESOPHAGOGASTRODUODENOSCOPY (EGD);  Surgeon: Greer Pickerel, MD;  Location: [facility];  Service: General;  Laterality: N/A;  . IR GENERIC HISTORICAL  10/04/2015   IR [place] Vivianne Master 10/04/2015 Markus Daft, MD [facility]  . IR GENERIC HISTORICAL  01/31/2016   IR [place] GASTRO/COLONIC TUBE PERCUT W/FLUORO 01/31/2016 Corrie Mckusick, DO [facility]  . IR GENERIC HISTORICAL  04/06/2016   IR [place] TUBE PERCUT W/FLUORO 04/06/2016 Arne Cleveland, MD [facility]  . IR GENERIC HISTORICAL  05/14/2016   IR GASTRIC TUBE PERC CHG W/O IMG GUIDE 05/14/2016 Sandi Mariscal, MD [facility]  . LAPAROSCOPIC GASTROSTOMY N/A 09/30/2015   Procedure: LAPAROSCOPIC GASTROSTOMY TUBE PLACEMENT;  Surgeon: Greer Pickerel, MD;  Location: [facility];  Service: General;  Laterality: N/A;  . LAPAROSCOPIC ROUX-EN-Y GASTRIC BYPASS WITH HIATAL HERNIA REPAIR N/A 07/08/2015   Procedure: LAPAROSCOPIC ROUX-EN-Y GASTRIC BYPASS WITH HIATAL HERNIA REPAIR WITH UPPER ENDOSCOPY;  Surgeon: Greer Pickerel, MD;  Location: [facility];  Service: General;  Laterality: N/A;  . LAPAROSCOPIC SMALL BOWEL RESECTION N/A 09/30/2015   Procedure: LAPAROSCOPIC REVISION OF ROUX LIMB;  Surgeon: Greer Pickerel, MD;  Location: [facility];  Service: General;  Laterality: N/A;  . LAPAROSCOPY N/A 09/30/2015   Procedure: LAPAROSCOPY DIAGNOSTIC;  Surgeon: Greer Pickerel, MD;  Location: [facility];  Service: General;  Laterality: N/A;  . TUBAL LIGATION    . WISDOM TOOTH EXTRACTION     Current Outpatient Prescriptions on File Prior to Visit  Medication Sig Dispense Refill  . amitriptyline (ELAVIL) 10 MG tablet Take 2 tablets (20 mg total) by mouth at bedtime. 60 tablet 11  . amphetamine-dextroamphetamine (ADDERALL) 10 MG tablet Take 1 tablet (10 mg total) by mouth 2 (two) times daily. 60 tablet 0  . CALCIUM-VITAMIN D PO Take by mouth 3 (three) times daily. chewable    . cetirizine (ZYRTEC) 10 MG tablet Take 1 tablet (10 mg total) by mouth at bedtime. 30 tablet 11  . clonazePAM (KLONOPIN) 0.5 MG tablet Take 1 tablet (0.5 mg total) by mouth  3 (three) times daily as needed for anxiety. 90 tablet 0  . Cyanocobalamin (VITAMIN B-12) 1000 MCG/15ML LIQD Take 5 mLs by mouth daily.    Marland Kitchen docusate sodium (COLACE) 100 MG capsule Take 100 mg by mouth daily.    Marland Kitchen eletriptan (RELPAX) 40 MG tablet   11  . fluticasone (FLONASE) 50 MCG/ACT nasal spray Place 2 sprays into both nostrils at bedtime. 16 g 2  . hydrocortisone (ANUSOL-HC) 25 MG suppository Place 1 suppository (25 mg total) rectally 2 (two) times daily. - after a bowel movement if possible (Patient taking differently: Place 25 mg rectally 2 (two) times daily as needed. - after a bowel movement if possible) 24 suppository 1  . ipratropium (ATROVENT) 0.03 % nasal spray Place 2 sprays into the nose 4 (four) times daily. 30 mL 1  . lactulose (CHRONULAC) 10 GM/15ML solution Take 30 g by mouth daily as needed for moderate constipation.   0  . Multiple Vitamin (MULTIVITAMIN) LIQD Take by mouth 2 (two) times daily. chewable    . Nutritional Supplements (FEEDING SUPPLEMENT, VITAL HIGH PROTEIN,) LIQD liquid Place 1,000 mLs into feeding tube daily. 69m/hr for 8hrs a day as needed  for poor oral intake; 300 ml free water q4hr (Patient taking differently: Place 1,000 mLs into feeding tube daily. 449mhr for 8hrs a day as needed for poor oral intake; 300 ml free water q4hr) 1000 mL 8  . ondansetron (ZOFRAN-ODT) 4 MG disintegrating tablet Take 1 tablet (4 mg total) by mouth every 6 (six) hours as needed for nausea. 30 tablet 0  . pantoprazole (PROTONIX) 40 MG tablet Take 1 tablet (40 mg total) by mouth daily. 30 tablet 1  . polyethylene glycol (MIRALAX / GLYCOLAX) packet Take 17 g by mouth daily.    . simethicone (MYLICON) 80 MG chewable tablet Chew 80 mg by mouth every 6 (six) hours as needed for flatulence.    . topiramate (TOPAMAX) 50 MG tablet Take 1 tablet (50 mg total) by mouth daily. (Patient taking differently: Take 150 mg by mouth daily. ) 30 tablet 11  . XULANE 150-35 MCG/24HR transdermal patch Place 1 patch onto the skin once a week.   11   No current facility-administered medications on file prior to visit.    Allergies  Allergen Reactions  . Sulfa Antibiotics Anaphylaxis  . Zosyn [Piperacillin Sod-Tazobactam So] Rash   Family History  Problem Relation Age of Onset  . Cancer Mother   . Stroke Father   . Diabetes Brother   . Seizures Son   . Migraines Neg Hx    Social History   Social History  . Marital status: Divorced    Spouse name: N/A  . Number of children: 2  . Years of education: 16   Occupational History  . [employer]- nurse tech    Social History Main Topics  . Smoking status: Never Smoker  . Smokeless tobacco: Never Used  . Alcohol use Yes     Comment: socially  . Drug use: No  . Sexual activity: Yes    Birth control/ protection: Surgical   Other Topics Concern  . None   Social History Narrative   Lives with partner   Caffeine use: minimal coffee   Right-handed   [facility] 10/26/2016 10/12/2016 07/18/2016 06/25/2016 06/11/2016  Decreased Interest 3 0 0 1 0  Down, Depressed, Hopeless 3 0 1 1 0  PHQ - 2 Score 6 0 1  2 0  Altered sleeping 2 - -  1 -  Tired, decreased energy 2 - - 1 -  Change in appetite 2 - - 3 -  Feeling bad or failure about yourself  0 - - 1 -  Trouble concentrating 1 - - 1 -  Moving slowly or fidgety/restless 3 - - 1 -  Suicidal thoughts 0 - - 0 -  PHQ-9 Score 16 - - 10 -  Difficult doing work/chores Extremely dIfficult - - Somewhat difficult -    Review of Systems See hpi    Objective:   Physical Exam  Constitutional: She is oriented to person, place, and time. She appears well-developed and well-nourished. No distress.  HENT:  Head: Normocephalic and atraumatic.  Right Ear: External ear normal.  Left Ear: External ear normal.  Eyes: Conjunctivae are normal. No scleral icterus.  Neck: Normal range of motion. Neck supple. No thyromegaly present.  Cardiovascular: Normal rate, regular rhythm, normal heart sounds and intact distal pulses.   Pulmonary/Chest: Effort normal and breath sounds normal. No respiratory distress.  Musculoskeletal: She exhibits no edema.  Lymphadenopathy:    She has no cervical adenopathy.  Neurological: She is alert and oriented to person, place, and time.  Skin: Skin is warm and dry. She is not diaphoretic. No erythema.  Psychiatric: Her behavior is normal. She exhibits a depressed mood.         BP 115/77 (BP Location: Right Arm, Patient Position: Sitting, Cuff Size: Normal)   Pulse 73   Temp 98.4 F (36.9 C) (Oral)   Resp 18   Ht _0  (1.727 m)   Wt 170 lb 6.4 oz (77.3 kg)   SpO2 98%   BMI 25.91 kg/m   Assessment & Plan:  Ok to refill adderall 10 bid x 3 mos when requested.  1. Diarrhea, unspecified type   2. Muscle cramp - Muscle cramps could be due to electrolyte loss with heat and chronic diarrhea. Rec pt obtain labs - cmp, mg, phos, tsh  3. Protein-calorie malnutrition, moderate (HCC)   4. Epigastric pain   5. Chronic nausea   6. Memory loss due to medical condition   7. Adjustment disorder with mixed anxiety and depressed  mood - Refer to Dr. Kinnie Scales with [facility].  8. S/P gastric bypass   9. Hypocalcemia   10. Vitamin D deficiency   11. Vitamin B12 deficiency - on bariatric supp  12. Debility     Orders Placed This Encounter  Procedures  . Vitamin B12  . VITAMIN D 25 Hydroxy (Vit-D Deficiency, Fractures)  . CBC with Differential/Platelet  . Comprehensive metabolic panel  . TSH  . Magnesium  . Phosphorus  . Ambulatory referral to Psychology    Referral Priority:   Routine    Referral Type:   Psychiatric    Referral Reason:   Specialty Services Required    Requested Specialty:   Psychology    Number of Visits Requested:   1  . POCT urinalysis dipstick    Meds ordered this encounter  Medications  . oxyCODONE (OXY IR/ROXICODONE) 5 MG immediate release tablet    Sig: Take 1-2 tablets (5-10 mg total) by mouth every 4 (four) hours as needed for moderate pain.    Dispense:  60 tablet    Refill:  0  . linaclotide (LINZESS) 145 MCG CAPS capsule    Sig: Take 1 capsule (145 mcg total) by mouth daily before breakfast. As needed for chronic constipation    Dispense:  30 capsule    Refill:  [place], M.D.  Primary Care at [facility] [address] [place], [place] [address] [phone] phone [phone] fax  10/27/16 3:30 AM

## 2016-10-26 ENCOUNTER — Encounter: Payer: Self-pay | Admitting: Family Medicine

## 2016-10-26 ENCOUNTER — Ambulatory Visit (INDEPENDENT_AMBULATORY_CARE_PROVIDER_SITE_OTHER): Payer: BLUE CROSS/BLUE SHIELD | Admitting: Family Medicine

## 2016-10-26 VITALS — BP 115/77 | HR 73 | Temp 98.4°F | Resp 18 | Ht 68.0 in | Wt 170.4 lb

## 2016-10-26 DIAGNOSIS — Z9884 Bariatric surgery status: Secondary | ICD-10-CM

## 2016-10-26 DIAGNOSIS — R413 Other amnesia: Secondary | ICD-10-CM

## 2016-10-26 DIAGNOSIS — R1013 Epigastric pain: Secondary | ICD-10-CM

## 2016-10-26 DIAGNOSIS — E440 Moderate protein-calorie malnutrition: Secondary | ICD-10-CM

## 2016-10-26 DIAGNOSIS — R252 Cramp and spasm: Secondary | ICD-10-CM

## 2016-10-26 DIAGNOSIS — R110 Nausea: Secondary | ICD-10-CM | POA: Diagnosis not present

## 2016-10-26 DIAGNOSIS — R197 Diarrhea, unspecified: Secondary | ICD-10-CM

## 2016-10-26 DIAGNOSIS — E559 Vitamin D deficiency, unspecified: Secondary | ICD-10-CM

## 2016-10-26 DIAGNOSIS — R5381 Other malaise: Secondary | ICD-10-CM

## 2016-10-26 DIAGNOSIS — E538 Deficiency of other specified B group vitamins: Secondary | ICD-10-CM | POA: Diagnosis not present

## 2016-10-26 DIAGNOSIS — F4323 Adjustment disorder with mixed anxiety and depressed mood: Secondary | ICD-10-CM | POA: Diagnosis not present

## 2016-10-26 LAB — POCT URINALYSIS DIP (MANUAL ENTRY)
Glucose, UA: NEGATIVE mg/dL
Leukocytes, UA: NEGATIVE
Nitrite, UA: NEGATIVE
PH UA: 6.5 (ref 5.0–8.0)
RBC UA: NEGATIVE
SPEC GRAV UA: 1.015 (ref 1.010–1.025)
UROBILINOGEN UA: 1 U/dL

## 2016-10-26 MED ORDER — OXYCODONE HCL 5 MG PO TABS: 5.0000 mg | ORAL_TABLET | ORAL | 0 refills | Status: DC | PRN

## 2016-10-26 MED ORDER — LINACLOTIDE 145 MCG PO CAPS: 145.0000 ug | ORAL_CAPSULE | Freq: Every day | ORAL | 5 refills | Status: DC

## 2016-10-26 NOTE — Patient Instructions (Addendum)
Lets get you over to Dr. Kyra Leyland who helps people deal with the changes that come from developing new disabling chronic medical conditions.  Pay attention to your medication regimen - the adderall and the klonopin - these are medications which can do the opposite of each other and I want you to make sure that they aren't "undoing" the other ones effects.  Change the topamax to at night.   IF you received an x-ray today, you will receive an invoice from [facility]. Please contact [facility] at [phone] with questions or concerns regarding your invoice.   IF you received labwork today, you will receive an invoice from [facility]. Please contact [facility] at [phone] with questions or concerns regarding your invoice.   Our billing staff will not be able to assist you with questions regarding bills from these companies.  You will be contacted with the lab results as soon as they are available. The fastest way to get your results is to activate your My Chart account. Instructions are located on the last page of this paperwork. If you have not heard from [name] regarding the results in 2 weeks, please contact this office.     Adjustment Disorder, Adult Adjustment disorder is a group of symptoms that can develop after a stressful life event, such as the loss of a job or serious physical illness. The symptoms can affect how you feel, think, and act. They may interfere with your relationships. Adjustment disorder increases your risk of suicide and substance abuse. If this disorder is not managed early, it can develop into a more serious condition, such as major depressive disorder or post-traumatic stress disorder. What are the causes? This condition happens when you have trouble recovering from or coping with a stressful life event. What increases the risk? You are more likely to develop this condition if:  You have had depression or anxiety.  You are being treated for a  long-term (chronic) illness.  You are being treated for an illness that cannot be cured (terminal illness).  You have a family history of mental illness.  What are the signs or symptoms? Symptoms of this condition include:  Extreme trouble doing daily tasks, such as going to work.  Sadness, depression, or crying spells.  Worrying a lot.  Loss of enjoyment.  Change in appetite or weight.  Feelings of loss or hopelessness.  Thoughts of suicide.  Anxiety, worry, or nervousness.  Trouble sleeping.  Avoiding family and friends.  Fighting or vandalism.  Complaining of feeling sick without being ill.  Feeling dazed or disconnected.  Nightmares.  Trouble sleeping.  Irritability.  Reckless driving.  Poor work Systems analyst.  Ignoring bills.  Symptoms of this condition start within three months of the stressful event. They do not last more than six months, unless the stressful circumstances last longer. Normal grieving after the death of a loved one is not a symptom of this condition. How is this diagnosed? To diagnose this condition, your health care provider will ask about what has happened in your life and how it has affected you. He or she may also ask about your medical history and your use of medicines, alcohol, and other substances. Your health care provider may do a physical exam and order lab tests or other studies. You may be referred to a mental health specialist. How is this treated? Treatment options for this condition include:  Counseling or talk therapy. Talk therapy is usually provided by mental health specialists.  Medicines. Certain medicines  may help with depression, anxiety, and sleep.  Support groups. These offer emotional support, advice, and guidance. They are made up of people who have had similar experiences.  Observation and time. This is sometimes called "watchful waiting." In this treatment, health care providers monitor your health and  behavior without other treatment. Adjustment disorder sometimes gets better on its own with time.  Follow these instructions at home:  Take over-the-counter and prescription medicines only as told by your health care provider.  Keep all follow-up visits as told by your health care provider. This is important. Contact a health care provider if:  Your symptoms do not improve in six months.  Your symptoms get worse. Get help right away if:  You have serious thoughts about hurting yourself or someone else. If you ever feel like you may hurt yourself or others, or have thoughts about taking your own life, get help right away. You can go to your nearest emergency department or call:  Your local emergency services (911 in the [place]  A suicide crisis helpline, such as the [facility] at 607-103-2420. This is open 24 hours a day.  Summary  Adjustment disorder is a group of symptoms that can develop after a stressful life event, such as the loss of a job or serious physical illness. The symptoms can affect how you feel, think, and act. They may interfere with your relationships.  Symptoms of this condition start within three months of the stressful event. They do not last more than six months, unless the stressful circumstances last longer.  Treatment may include talk therapy, medicines, participation in a support group, or observation to see if symptoms improve.  Contact your health care provider if your symptoms get worse or do not improve in six months.  If you ever feel like you may hurt yourself or others, or have thoughts about taking your own life, get help right away. This information is not intended to replace advice given to you by your health care provider. Make sure you discuss any questions you have with your health care provider. Document Released: 10/28/2005 Document Revised: 04/24/2016 Document Reviewed: 04/24/2016 Elsevier Interactive Patient  Education  [employer].

## 2016-10-27 LAB — CBC WITH DIFFERENTIAL/PLATELET
BASOS ABS: 0.1 10*3/uL (ref 0.0–0.2)
Basos: 1 %
EOS (ABSOLUTE): 0.1 10*3/uL (ref 0.0–0.4)
Eos: 1 %
HEMOGLOBIN: 12.3 g/dL (ref 11.1–15.9)
Hematocrit: 36.7 % (ref 34.0–46.6)
IMMATURE GRANS (ABS): 0 10*3/uL (ref 0.0–0.1)
Immature Granulocytes: 0 %
LYMPHS: 22 %
Lymphocytes Absolute: 1.8 10*3/uL (ref 0.7–3.1)
MCH: 30.9 pg (ref 26.6–33.0)
MCHC: 33.5 g/dL (ref 31.5–35.7)
MCV: 92 fL (ref 79–97)
MONOCYTES: 4 %
Monocytes Absolute: 0.3 10*3/uL (ref 0.1–0.9)
Neutrophils Absolute: 5.9 10*3/uL (ref 1.4–7.0)
Neutrophils: 72 %
Platelets: 339 10*3/uL (ref 150–379)
RBC: 3.98 x10E6/uL (ref 3.77–5.28)
RDW: 13.9 % (ref 12.3–15.4)
WBC: 8.1 10*3/uL (ref 3.4–10.8)

## 2016-10-27 LAB — COMPREHENSIVE METABOLIC PANEL
ALT: 13 IU/L (ref 0–32)
AST: 18 IU/L (ref 0–40)
Albumin/Globulin Ratio: 1.2 (ref 1.2–2.2)
Albumin: 3.7 g/dL (ref 3.5–5.5)
Alkaline Phosphatase: 50 IU/L (ref 39–117)
BUN / CREAT RATIO: 14 (ref 9–23)
BUN: 18 mg/dL (ref 6–24)
Bilirubin Total: 0.2 mg/dL (ref 0.0–1.2)
CO2: 18 mmol/L — AB (ref 20–29)
CREATININE: 1.28 mg/dL — AB (ref 0.57–1.00)
Calcium: 8.7 mg/dL (ref 8.7–10.2)
Chloride: 106 mmol/L (ref 96–106)
GFR calc non Af Amer: 50 mL/min/{1.73_m2} — ABNORMAL LOW (ref >59)
GFR, EST AFRICAN AMERICAN: 58 mL/min/{1.73_m2} — AB (ref >59)
GLUCOSE: 78 mg/dL (ref 65–99)
Globulin, Total: 3.2 g/dL (ref 1.5–4.5)
Potassium: 4.4 mmol/L (ref 3.5–5.2)
Sodium: 140 mmol/L (ref 134–144)
TOTAL PROTEIN: 6.9 g/dL (ref 6.0–8.5)

## 2016-10-27 LAB — VITAMIN B12: Vitamin B-12: 502 pg/mL (ref 232–1245)

## 2016-10-27 LAB — MAGNESIUM: Magnesium: 2 mg/dL (ref 1.6–2.3)

## 2016-10-27 LAB — PHOSPHORUS: Phosphorus: 3 mg/dL (ref 2.5–4.5)

## 2016-10-27 LAB — TSH: TSH: 0.907 u[IU]/mL (ref 0.450–4.500)

## 2016-10-27 LAB — VITAMIN D 25 HYDROXY (VIT D DEFICIENCY, FRACTURES): VIT D 25 HYDROXY: 61.6 ng/mL (ref 30.0–100.0)

## 2016-10-28 MED FILL — oxyCODONE HCL 5 MG TABS: 5 | 5 days supply | Qty: 60 | Fill #0

## 2016-10-29 ENCOUNTER — Telehealth: Payer: Self-pay | Admitting: Family Medicine

## 2016-10-29 NOTE — Telephone Encounter (Signed)
Pt is calling to check on lab results. Please advise when ready to be read.  [phone]

## 2016-11-03 NOTE — Telephone Encounter (Signed)
Can you please review labs so I can call the patient ?

## 2016-11-06 NOTE — Telephone Encounter (Signed)
Note sent to MyChart.

## 2016-11-13 MED FILL — AMITRIPTYLINE HCL 10 MG TAB: 10 | 30 days supply | Qty: 60 | Fill #1

## 2016-11-13 MED FILL — XULANE PATCH: 150-35 | 21 days supply | Qty: 3 | Fill #8

## 2016-11-13 MED FILL — TOPIRAMATE 50 MG TABLET: 50 | 30 days supply | Qty: 30 | Fill #1

## 2016-11-23 ENCOUNTER — Encounter: Payer: Self-pay | Admitting: Skilled Nursing Facility1

## 2016-11-23 ENCOUNTER — Encounter: Payer: BLUE CROSS/BLUE SHIELD | Attending: General Surgery | Admitting: Skilled Nursing Facility1

## 2016-11-23 DIAGNOSIS — G932 Benign intracranial hypertension: Secondary | ICD-10-CM | POA: Insufficient documentation

## 2016-11-23 DIAGNOSIS — Z9884 Bariatric surgery status: Secondary | ICD-10-CM | POA: Diagnosis not present

## 2016-11-23 DIAGNOSIS — Z713 Dietary counseling and surveillance: Secondary | ICD-10-CM | POA: Diagnosis present

## 2016-11-23 DIAGNOSIS — E784 Other hyperlipidemia: Secondary | ICD-10-CM | POA: Diagnosis not present

## 2016-11-23 DIAGNOSIS — E440 Moderate protein-calorie malnutrition: Secondary | ICD-10-CM

## 2016-11-23 DIAGNOSIS — R7303 Prediabetes: Secondary | ICD-10-CM | POA: Insufficient documentation

## 2016-11-23 DIAGNOSIS — K912 Postsurgical malabsorption, not elsewhere classified: Secondary | ICD-10-CM | POA: Diagnosis not present

## 2016-11-23 DIAGNOSIS — E559 Vitamin D deficiency, unspecified: Secondary | ICD-10-CM | POA: Insufficient documentation

## 2016-11-23 DIAGNOSIS — K219 Gastro-esophageal reflux disease without esophagitis: Secondary | ICD-10-CM | POA: Insufficient documentation

## 2016-11-23 DIAGNOSIS — G4733 Obstructive sleep apnea (adult) (pediatric): Secondary | ICD-10-CM | POA: Insufficient documentation

## 2016-11-23 NOTE — Progress Notes (Signed)
RYGB Surgery  Medical Nutrition Therapy:  Appt start time: 400 end time: 435  Primary concerns today: Post-operative Bariatric Surgery Nutrition Management.  Margaret Fletcher returns for a bariatric nutrition follow up post op RYGB.   1 can (8 oz) Vital High protein enteral formula overnight (237 calories, 20.7 grams protein, 5 grams fat, 26.7 grams carbohydrate, 7 oz water) Patient flushes tube with 8oz. Also using when out for the day  Pt arrived with a slight slur in her words and slower to move. Pt states through the month of July on several mini vacations it was very hot and she knew she was dehydrated and ate nothing and knew she was sick but she refused to go to the doctor-pt states she is so over feeding tubes and an irregular life she just dit not care anymore. Pt states she has been avoiding Dr. Redmond Pulling because she knows he is going to put her in the hospital stating she does NOT want to go to the hospital. Pt states she now has [employer] blue shield so she has insurance again. Pt states she is Thinking her lack of vision is from intercranial hypertension. Pt states her  insurance has been limiting her care. Pt states Eating creates anxiety and physical pain in her stomach. Pt states addiction to her pain pills is constantly in the back of her mind causing her to not want to take them. Pt states is she drinks fluid then she cannot eat and vice versa. Pt states a couple times she has woken up really sweaty-did not check her blood sugar. Pt states back when Dr. Redmond Pulling first prescribed the enteral formula he prescribed it for 3 a day but she never drank 3 a day so she had a stock pile and that is how she has been sustaining the amount without having insurance. Pt states she has had back pain. Pt showed dietitian a picture of a white cloth with what appeared to be dried blood: burgundy in color and showed dietitian her tube which had a back flow of a blood colored fluid in it, pt also showed where there  is a distended mass just above the insertion point of her tube that when poked decompressed giving the appearance of fluid.  Pt unable to take in any substantial nutrients PO due to nausea, vomiting, and pain.  Pts labs: urine with trace amounts of protein and ketones.  A message was left for Dr. Greer Pickerel with [facility] to inform him of this pt (11/23/2016:11:30pm).  Surgery date: 07/08/2015 Surgery type: RYGB [facility] at [facility]: 278 lbs on 05/24/2014 Weight today: 170 lbs Weight change: 17.2  TANITA  BODY COMP RESULTS  07/01/15 07/23/15 10/31/15 02/07/16 03/16/16 04/20/16 06/22/2016 08/24/2016 11/23/2016   BMI (kg/m^2) 40.4 38.6 33.4 30.4 29 28.3 28.5 29.3 25.8   Fat Mass (lbs) 138.5 131.0 103.2 84 80 74 69.6 67.8 54.4   Fat Free Mass (lbs) 127.5 122.8 116.4 116.2 110.6 112.2 118 119.4 115.6   Total Body Water (lbs) 93.5 90.4 84.4 83.4 79.2 80.2 84.2 85.2 82.4    Preferred Learning Style:   No preference indicated   Learning Readiness:   Ready  Fluid intake: 24 oz water and tea + 8oz can enteral formula  Estimated total protein intake: unable to determine  Medications: see list Supplementation: chewable multi and calcium and B12 liquid dropper  Drinking while eating: yes Hair loss: unknown, patient has alopecia Carbonated beverages: no N/V/D/C: nausea and constipation Dumping syndrome: none  Recent  physical activity:  ADL's  Progress Towards Goal(s):  In progress.  Handouts given during visit include:  none   Nutritional Diagnosis:  NI-2.1 Inadequate intake As related to food intolerance, nausea, and taste aversions.  As evidenced by patient requiring enteral nutrition support to meet nutrient needs.    Intervention:  Nutrition counseling provided.  Dietitian explained to the pt the importance of keeping up care with her surgeon Goals: -Attempt to get in 2 cans of vital throughout the day-As Tolerated: try to put in 1-2 ounces every 2-3 hours  -check your blood sugar  when sweaty: if below 70 try some hard candies or juice -Use food as a sense of normalcy and flavor Teaching Method Utilized:  Visual Auditory Hands on  Barriers to learning/adherence to lifestyle change: nausea and inability to meet nutrition needs orally  Demonstrated degree of understanding via:  Teach Back   Monitoring/Evaluation:  Dietary intake, exercise, and body weight. Follow up in 4 weeks.

## 2016-11-24 ENCOUNTER — Ambulatory Visit: Payer: 59 | Admitting: Skilled Nursing Facility1

## 2016-11-25 ENCOUNTER — Encounter: Payer: Self-pay | Admitting: Family Medicine

## 2016-11-25 ENCOUNTER — Other Ambulatory Visit: Payer: Self-pay | Admitting: Family Medicine

## 2016-11-30 ENCOUNTER — Other Ambulatory Visit: Payer: Self-pay | Admitting: General Surgery

## 2016-11-30 ENCOUNTER — Ambulatory Visit (HOSPITAL_COMMUNITY)
Admission: RE | Admit: 2016-11-30 | Discharge: 2016-11-30 | Disposition: A | Discharge status: Home / Self Care | Payer: 59 | Source: Ambulatory Visit | Attending: General Surgery | Admitting: General Surgery

## 2016-11-30 DIAGNOSIS — R1013 Epigastric pain: Secondary | ICD-10-CM

## 2016-11-30 DIAGNOSIS — Z9884 Bariatric surgery status: Secondary | ICD-10-CM

## 2016-11-30 DIAGNOSIS — E860 Dehydration: Secondary | ICD-10-CM | POA: Insufficient documentation

## 2016-11-30 DIAGNOSIS — G8929 Other chronic pain: Secondary | ICD-10-CM

## 2016-11-30 MED ORDER — ACETAMINOPHEN 325 MG PO TABS: 325.0000 mg | ORAL_TABLET | ORAL | Status: DC | PRN

## 2016-11-30 MED ORDER — THIAMINE HCL 100 MG/ML IJ SOLN
INTRAVENOUS | Status: DC
  Administered 2016-11-30: 11:00:00 via INTRAVENOUS
  Filled 2016-11-30 (×4): qty 1000

## 2016-11-30 MED ORDER — ONDANSETRON HCL 4 MG/2ML IJ SOLN: 4.0000 mg | INTRAMUSCULAR | Status: DC | PRN

## 2016-11-30 MED ORDER — ONDANSETRON 4 MG PO TBDP: 4.0000 mg | ORAL_TABLET | ORAL | Status: DC | PRN

## 2016-11-30 MED ORDER — ACETAMINOPHEN 325 MG RE SUPP
325.0000 mg | RECTAL | Status: DC | PRN
  Filled 2016-11-30: qty 2

## 2016-11-30 MED ORDER — ACETAMINOPHEN 160 MG/5ML PO SOLN
325.0000 mg | ORAL | Status: DC | PRN
  Administered 2016-11-30: 650 mg
  Filled 2016-11-30: qty 20.3

## 2016-11-30 NOTE — Discharge Instructions (Signed)
Patient received banana bag 1022mL IV.

## 2016-11-30 NOTE — Progress Notes (Signed)
Provider: Dr. Greer Pickerel   Procedure: Banana bag 1073mL   Diagnosis Association: Dehydration (E86.0)   Treatment: Patient received banana bag 1024mL.  Patient tolerated procedure well with no transfusion reaction. Discharge instructions given to patient and patient states an understanding. Patient alert, oriented, and ambulatory at time of discharge.

## 2016-12-02 ENCOUNTER — Other Ambulatory Visit: Payer: Self-pay | Admitting: General Surgery

## 2016-12-02 ENCOUNTER — Telehealth (HOSPITAL_COMMUNITY): Payer: Self-pay

## 2016-12-02 DIAGNOSIS — Z9884 Bariatric surgery status: Secondary | ICD-10-CM

## 2016-12-02 DIAGNOSIS — T85848A Pain due to other internal prosthetic devices, implants and grafts, initial encounter: Secondary | ICD-10-CM

## 2016-12-02 DIAGNOSIS — R1010 Upper abdominal pain, unspecified: Secondary | ICD-10-CM

## 2016-12-02 MED FILL — XULANE PATCH: 150-35 | 21 days supply | Qty: 3 | Fill #9

## 2016-12-02 NOTE — Telephone Encounter (Signed)
Dionisio David emailed photos and information on 12/01/16 1615 that she wanted Dr Redmond Pulling to view.  She was unable to contact him herself and he was not in the office when she dropped by.  She asked that I relay this information.  Information provided to Dr Redmond Pulling who will follow up with Ms [name]. See below for email information:  It felt really tight at the top close to where my hand was  also where the pain is ...the other picture was taken on Saturday night I ate maybe 4 spoonfuls of rice  zucchini onions mushrooms and chicken hibachi style grilled  by Monday night I was so uncomfortable I took 5 mg of pain medication....the days Friday I took 2 doses of 2.5 mg and Saturday 2 doses of 2.5 Sunday I ate one chicken wing flat drunk a cup of strawberry  lemonade not all at one time but through the night felt extremely  nauseated most of the day  stomach hurt a lot most of the day 3 doses of 2.5 Monday had my infusion felt stomach discomfort during the infusion took 2.5  then and nausea medication and another 2.5 later that evening around 6 then again at 9 or so and by 12 or 1 I was so uncomfortable I was cry I took 5 mg and  nausea medication  Today is better stomach is not as big  took 2.5 this morning most had water to drink not a whole bottle yet 16oz .Marland KitchenMarland Kitchen

## 2016-12-04 ENCOUNTER — Telehealth: Payer: Self-pay | Admitting: Family Medicine

## 2016-12-04 ENCOUNTER — Other Ambulatory Visit: Payer: 59

## 2016-12-04 MED ORDER — AMPHETAMINE-DEXTROAMPHETAMINE 10 MG PO TABS: 10.0000 mg | ORAL_TABLET | Freq: Two times a day (BID) | ORAL | 0 refills | Status: DC

## 2016-12-04 MED ORDER — CLONAZEPAM 0.5 MG PO TABS: 0.5000 mg | ORAL_TABLET | Freq: Three times a day (TID) | ORAL | 0 refills | Status: DC | PRN

## 2016-12-04 MED FILL — clonazePAM 0.5 MG TABS: 0.5 | 30 days supply | Qty: 90 | Fill #0

## 2016-12-04 MED FILL — DEXTROAMP-AMPHETAMIN 10 MG: 10 | 30 days supply | Qty: 60 | Fill #0

## 2016-12-04 NOTE — Telephone Encounter (Signed)
Pt here to pick up

## 2016-12-07 ENCOUNTER — Ambulatory Visit
Admission: RE | Admit: 2016-12-07 | Discharge: 2016-12-07 | Disposition: A | Discharge status: Home / Self Care | Payer: BLUE CROSS/BLUE SHIELD | Source: Ambulatory Visit | Attending: General Surgery | Admitting: General Surgery

## 2016-12-07 ENCOUNTER — Other Ambulatory Visit: Payer: Self-pay

## 2016-12-07 DIAGNOSIS — T85848A Pain due to other internal prosthetic devices, implants and grafts, initial encounter: Secondary | ICD-10-CM

## 2016-12-07 DIAGNOSIS — Z9884 Bariatric surgery status: Secondary | ICD-10-CM

## 2016-12-07 DIAGNOSIS — R1010 Upper abdominal pain, unspecified: Secondary | ICD-10-CM

## 2016-12-07 MED ORDER — IOPAMIDOL (ISOVUE-300) INJECTION 61%
100.0000 mL | Freq: Once | INTRAVENOUS | Status: AC | PRN
  Administered 2016-12-07: 100 mL via INTRAVENOUS

## 2016-12-09 ENCOUNTER — Other Ambulatory Visit: Payer: Self-pay | Admitting: General Surgery

## 2016-12-10 ENCOUNTER — Ambulatory Visit (HOSPITAL_COMMUNITY)
Admission: RE | Admit: 2016-12-10 | Discharge: 2016-12-10 | Disposition: A | Discharge status: Home / Self Care | Payer: BLUE CROSS/BLUE SHIELD | Source: Ambulatory Visit | Attending: General Surgery | Admitting: General Surgery

## 2016-12-10 DIAGNOSIS — E860 Dehydration: Secondary | ICD-10-CM | POA: Insufficient documentation

## 2016-12-10 LAB — COMPREHENSIVE METABOLIC PANEL
ALBUMIN: 2.7 g/dL — AB (ref 3.5–5.0)
ALK PHOS: 31 U/L — AB (ref 38–126)
ALT: 19 U/L (ref 14–54)
ANION GAP: 4 — AB (ref 5–15)
AST: 19 U/L (ref 15–41)
BILIRUBIN TOTAL: 0.2 mg/dL — AB (ref 0.3–1.2)
BUN: 17 mg/dL (ref 6–20)
CALCIUM: 7.9 mg/dL — AB (ref 8.9–10.3)
CO2: 27 mmol/L (ref 22–32)
CREATININE: 1.03 mg/dL — AB (ref 0.44–1.00)
Chloride: 107 mmol/L (ref 101–111)
GLUCOSE: 82 mg/dL (ref 65–99)
Potassium: 4.4 mmol/L (ref 3.5–5.1)
Sodium: 138 mmol/L (ref 135–145)
Total Protein: 5.5 g/dL — ABNORMAL LOW (ref 6.5–8.1)

## 2016-12-10 LAB — PREALBUMIN: Prealbumin: 21.5 mg/dL (ref 18–38)

## 2016-12-10 MED ORDER — ACETAMINOPHEN 325 MG RE SUPP
325.0000 mg | RECTAL | Status: DC | PRN
  Filled 2016-12-10: qty 2

## 2016-12-10 MED ORDER — ACETAMINOPHEN 160 MG/5ML PO SOLN
325.0000 mg | ORAL | Status: DC | PRN
  Filled 2016-12-10: qty 20.3

## 2016-12-10 MED ORDER — ACETAMINOPHEN 325 MG PO TABS: 325.0000 mg | ORAL_TABLET | ORAL | Status: DC | PRN

## 2016-12-10 MED ORDER — ONDANSETRON 4 MG PO TBDP: 4.0000 mg | ORAL_TABLET | ORAL | Status: DC | PRN

## 2016-12-10 MED ORDER — THIAMINE HCL 100 MG/ML IJ SOLN
Freq: Once | INTRAVENOUS | Status: AC
  Administered 2016-12-10: 10:00:00 via INTRAVENOUS
  Filled 2016-12-10: qty 1000

## 2016-12-10 MED ORDER — ONDANSETRON HCL 4 MG/2ML IJ SOLN: 4.0000 mg | INTRAMUSCULAR | Status: DC | PRN

## 2016-12-10 NOTE — Progress Notes (Signed)
Provider Greer Pickerel   Procedure: sodium chloride 0.9 % 1,000 mL with thiamine 014 mg, folic acid 1 mg, multivitamins adult 10 mL infusion   Patient tolerated procedure well. Discharge instructions given to patient and patient states an understanding. Patient alert, oriented, and ambulatory at time of discharge.

## 2016-12-10 NOTE — Discharge Instructions (Signed)
Patient received sodium chloride 0.9 % 1,000 mL with thiamine 716 mg, folic acid 1 mg, multivitamins adult 10 mL infusion.

## 2016-12-21 ENCOUNTER — Telehealth: Payer: Self-pay | Admitting: Family Medicine

## 2016-12-21 NOTE — Telephone Encounter (Signed)
See message below oxycodone refill

## 2016-12-21 NOTE — Telephone Encounter (Signed)
PATIENT WOULD LIKE DR. SHAW TO KNOW THAT SHE NEEDS A REFILL ON HER OXYCODONE 5 MG. PLEASE CALL HER WHEN SHE CAN PICK THE PRESCRIPTION UP. BEST PHONE [phone] (CELL) [place]

## 2016-12-23 ENCOUNTER — Telehealth: Payer: Self-pay | Admitting: Skilled Nursing Facility1

## 2016-12-23 NOTE — Telephone Encounter (Signed)
Pt states Ensure is the only thing that was affordable to use as nutrients. Pt states she is going to send hospital for rare diseases. Pt states she [name] with water using protein shakes with ice cream or tropical smoothie. Pt states her surgeon offered the option of Trying to reverse the surgery but she declined that idea for fear of gaining wt. Pt states she sees the phycologist tomorrow.

## 2016-12-24 ENCOUNTER — Ambulatory Visit (INDEPENDENT_AMBULATORY_CARE_PROVIDER_SITE_OTHER): Payer: BLUE CROSS/BLUE SHIELD | Admitting: Psychiatry

## 2016-12-24 DIAGNOSIS — F411 Generalized anxiety disorder: Secondary | ICD-10-CM | POA: Diagnosis not present

## 2016-12-24 DIAGNOSIS — F4323 Adjustment disorder with mixed anxiety and depressed mood: Secondary | ICD-10-CM

## 2016-12-24 NOTE — Telephone Encounter (Signed)
Pt. Called 12/24/16 to check on the status of her RX refill of oxycodone. I informed her of the note already in and that I would re-submit the request. Pt. Is stating that everytime she tries to put a RX refill request in MyChart, it does not work.. Please call pt back when the RX is ready to be picked up. Best call back number is [phone] (cell)

## 2016-12-26 ENCOUNTER — Encounter (HOSPITAL_BASED_OUTPATIENT_CLINIC_OR_DEPARTMENT_OTHER): Payer: Self-pay | Admitting: Emergency Medicine

## 2016-12-26 ENCOUNTER — Emergency Department (HOSPITAL_BASED_OUTPATIENT_CLINIC_OR_DEPARTMENT_OTHER)
Admission: EM | Admit: 2016-12-26 | Discharge: 2016-12-27 | Disposition: A | Discharge status: Home / Self Care | Payer: BLUE CROSS/BLUE SHIELD | Attending: Emergency Medicine | Admitting: Emergency Medicine

## 2016-12-26 DIAGNOSIS — Z79899 Other long term (current) drug therapy: Secondary | ICD-10-CM | POA: Insufficient documentation

## 2016-12-26 DIAGNOSIS — R109 Unspecified abdominal pain: Secondary | ICD-10-CM

## 2016-12-26 DIAGNOSIS — R1013 Epigastric pain: Secondary | ICD-10-CM | POA: Insufficient documentation

## 2016-12-26 LAB — CBC WITH DIFFERENTIAL/PLATELET
BASOS ABS: 0.1 10*3/uL (ref 0.0–0.1)
BASOS PCT: 1 %
Eosinophils Absolute: 0.2 10*3/uL (ref 0.0–0.7)
Eosinophils Relative: 3 %
HEMATOCRIT: 31.7 % — AB (ref 36.0–46.0)
HEMOGLOBIN: 10.5 g/dL — AB (ref 12.0–15.0)
Lymphocytes Relative: 23 %
Lymphs Abs: 2 10*3/uL (ref 0.7–4.0)
MCH: 31.2 pg (ref 26.0–34.0)
MCHC: 33.1 g/dL (ref 30.0–36.0)
MCV: 94.1 fL (ref 78.0–100.0)
Monocytes Absolute: 0.6 10*3/uL (ref 0.1–1.0)
Monocytes Relative: 7 %
NEUTROS PCT: 66 %
Neutro Abs: 5.5 10*3/uL (ref 1.7–7.7)
Platelets: 258 10*3/uL (ref 150–400)
RBC: 3.37 MIL/uL — ABNORMAL LOW (ref 3.87–5.11)
RDW: 12.8 % (ref 11.5–15.5)
WBC: 8.3 10*3/uL (ref 4.0–10.5)

## 2016-12-26 MED ORDER — HYDROMORPHONE HCL 1 MG/ML IJ SOLN
1.0000 mg | Freq: Once | INTRAMUSCULAR | Status: AC
  Administered 2016-12-26: 1 mg via INTRAVENOUS
  Filled 2016-12-26: qty 1

## 2016-12-26 MED ORDER — SODIUM CHLORIDE 0.9 % IV BOLUS (SEPSIS)
1000.0000 mL | Freq: Once | INTRAVENOUS | Status: AC
  Administered 2016-12-26: 1000 mL via INTRAVENOUS

## 2016-12-26 MED ORDER — ONDANSETRON HCL 4 MG/2ML IJ SOLN
4.0000 mg | Freq: Once | INTRAMUSCULAR | Status: AC
  Administered 2016-12-26: 4 mg via INTRAVENOUS
  Filled 2016-12-26: qty 2

## 2016-12-26 NOTE — ED Triage Notes (Signed)
PT presents with c/o severe abdominal pain that started about 5 minutes ago. PT has feeding tube in place and is still encouraged to eat. PT states she feels her abdomin is swollen.

## 2016-12-27 LAB — COMPREHENSIVE METABOLIC PANEL
ALBUMIN: 3 g/dL — AB (ref 3.5–5.0)
ALT: 16 U/L (ref 14–54)
ANION GAP: 5 (ref 5–15)
AST: 19 U/L (ref 15–41)
Alkaline Phosphatase: 39 U/L (ref 38–126)
BILIRUBIN TOTAL: 0.3 mg/dL (ref 0.3–1.2)
BUN: 19 mg/dL (ref 6–20)
CHLORIDE: 107 mmol/L (ref 101–111)
CO2: 24 mmol/L (ref 22–32)
CREATININE: 1.1 mg/dL — AB (ref 0.44–1.00)
Calcium: 7.9 mg/dL — ABNORMAL LOW (ref 8.9–10.3)
GFR calc Af Amer: >60 mL/min (ref >60)
GFR calc non Af Amer: 59 mL/min — ABNORMAL LOW (ref >60)
GLUCOSE: 90 mg/dL (ref 65–99)
POTASSIUM: 4.1 mmol/L (ref 3.5–5.1)
SODIUM: 136 mmol/L (ref 135–145)
TOTAL PROTEIN: 5.9 g/dL — AB (ref 6.5–8.1)

## 2016-12-27 LAB — LIPASE, BLOOD: LIPASE: 32 U/L (ref 11–51)

## 2016-12-27 MED ORDER — ONDANSETRON 4 MG PO TBDP
4.0000 mg | ORAL_TABLET | Freq: Once | ORAL | Status: AC
  Administered 2016-12-27: 4 mg via ORAL
  Filled 2016-12-27: qty 1

## 2016-12-27 MED ORDER — OXYCODONE-ACETAMINOPHEN 5-325 MG PO TABS
1.0000 | ORAL_TABLET | Freq: Once | ORAL | Status: AC
  Administered 2016-12-27: 1 via ORAL
  Filled 2016-12-27: qty 1

## 2016-12-27 MED ORDER — FENTANYL CITRATE (PF) 100 MCG/2ML IJ SOLN
50.0000 ug | Freq: Once | INTRAMUSCULAR | Status: AC
  Administered 2016-12-27: 50 ug via INTRAVENOUS
  Filled 2016-12-27: qty 2

## 2016-12-27 MED ORDER — OXYCODONE-ACETAMINOPHEN 5-325 MG PO TABS: 1.0000 | ORAL_TABLET | Freq: Three times a day (TID) | ORAL | 0 refills | Status: DC | PRN

## 2016-12-27 MED ORDER — ONDANSETRON HCL 4 MG PO TABS: 4.0000 mg | ORAL_TABLET | Freq: Four times a day (QID) | ORAL | 0 refills | Status: DC

## 2016-12-27 MED ORDER — METOCLOPRAMIDE HCL 5 MG/ML IJ SOLN
10.0000 mg | Freq: Once | INTRAMUSCULAR | Status: AC
  Administered 2016-12-27: 10 mg via INTRAVENOUS
  Filled 2016-12-27: qty 2

## 2016-12-27 NOTE — ED Notes (Signed)
Pt given ice water; told to notify RN if she throws up.

## 2016-12-27 NOTE — ED Notes (Signed)
Pt able to keep ice water down with no difficulty and states she is ready to go; MD notified.

## 2016-12-27 NOTE — ED Notes (Signed)
Family at bedside. 

## 2016-12-27 NOTE — Discharge Instructions (Signed)
Sorry about your abdominal pain, but likely the labs are reassuring and the pain improved in the emergency room. Please take pain medicine only if the pain is excruciating. Please contact your primary doctor for further evaluation. Clear liquid diet for the next 3-4 days is recommended.

## 2016-12-27 NOTE — ED Provider Notes (Signed)
[facility] EMERGENCY DEPARTMENT Provider Note   CSN: [id number] Arrival date & time: 12/26/16  2204     History   Chief Complaint Chief Complaint  Patient presents with  . Abdominal Pain    HPI Margaret Fletcher is a 46 y.o. female.  HPI Patient with history of idiopathic intracranial hypertension, gastric Roux-en-Y surgery comes in with chief complaint of abdominal pain.  Patient reports that since her Roux-en-Y procedure she has had intermittent severe abdominal pain.  The abdominal pain is sudden onset typically and is evoked by food intake.  At one point patient's pain was so severe that she had lost appetite and had stopped eating therefore she has a G-tube in place.  Current pain started prior to emergency room arrival after patient had dinner.  Patient reports that the abdominal pain is in the epigastric region and there is associated nausea.  Patient denies any fevers or chills.  Patient feels her abdomen is distended.  Bowel movements have been a regular but she is passing flatus.  Typically pain resolves with hydration and some pain meds per patient.  Past Medical History:  Diagnosis Date  . Alopecia   . Anxiety   . Autoimmune disease ([place])    "Alopecia"  . Bacterial vaginosis   . Cyst of spleen   . Depression   . Frequent UTI   . Hypertrophy of breast   . IIH (idiopathic intracranial hypertension) 2017   "related to headaches"  . Migraine headache    denies, ruled out - Propranolol.Using for headaches    Patient Active Problem List   Diagnosis Date Noted  . Dehydration 11/30/2016  . Memory loss due to medical condition 10/13/2016  . G tube feedings ([place]) 10/13/2016  . Adjustment disorder with mixed anxiety and depressed mood 10/13/2016  . Debility 10/13/2016  . Hypokalemia 02/22/2016  . Constipation, chronic 02/22/2016  . Chronic nausea 02/22/2016  . Epigastric pain 02/19/2016  . Protein-calorie malnutrition, moderate ([place]) 08/19/2015  . Mild  obstructive sleep apnea 07/08/2015  . Dyslipidemia 07/08/2015  . S/P gastric bypass 07/08/2015  . IIH (idiopathic intracranial hypertension) 06/03/2015  . Chronic migraine without aura or status migrainosus 04/30/2015  . Worsening headaches 04/30/2015  . Vision changes 04/30/2015  . Perceived hearing changes 04/30/2015  . Liver lesion 03/21/2013  . Splenic cyst 03/21/2013  . Abdominal pain 03/21/2013    Past Surgical History:  Procedure Laterality Date  . APPENDECTOMY    . BREAST REDUCTION SURGERY Bilateral 03/26/2015   Procedure: BILATERAL BREAST REDUCTION  ;  Surgeon: Youlanda Roys, MD;  Location: [facility];  Service: Plastics;  Laterality: Bilateral;  . BREATH TEK H PYLORI N/A 05/21/2014   Procedure: BREATH TEK H PYLORI;  Surgeon: Greer Pickerel, MD;  Location: [facility];  Service: General;  Laterality: N/A;  . CESAREAN SECTION    . CESAREAN SECTION    . CHOLECYSTECTOMY    . DILITATION & CURRETTAGE/HYSTROSCOPY WITH NOVASURE ABLATION N/A 07/19/2012   Procedure: HYSTEROSCOPY WITH NOVASURE ABLATION;  Surgeon: Farrel Gobble. Harrington Challenger, MD;  Location: [facility];  Service: Gynecology;  Laterality: N/A;  . ENDOMETRIAL ABLATION    . ESOPHAGOGASTRODUODENOSCOPY N/A 08/16/2015   Procedure: UPPER ESOPHAGOGASTRODUODENOSCOPY (EGD);  Surgeon: Greer Pickerel, MD;  Location: [facility];  Service: General;  Laterality: N/A;  . ESOPHAGOGASTRODUODENOSCOPY N/A 02/21/2016   Procedure: ESOPHAGOGASTRODUODENOSCOPY (EGD);  Surgeon: Greer Pickerel, MD;  Location: [facility];  Service: General;  Laterality: N/A;  . IR GENERIC HISTORICAL  10/04/2015  IR [place] GASTRO/COLONIC TUBE PERCUT W/FLUORO 10/04/2015 Markus Daft, MD [facility]  . IR GENERIC HISTORICAL  01/31/2016   IR [place] GASTRO/COLONIC TUBE PERCUT W/FLUORO 01/31/2016 Corrie Mckusick, DO [facility]  . IR GENERIC HISTORICAL  04/06/2016   IR [place] TUBE PERCUT W/FLUORO 04/06/2016 Arne Cleveland, MD [facility]  . IR GENERIC  HISTORICAL  05/14/2016   IR GASTRIC TUBE PERC CHG W/O IMG GUIDE 05/14/2016 Sandi Mariscal, MD [facility]  . LAPAROSCOPIC GASTROSTOMY N/A 09/30/2015   Procedure: LAPAROSCOPIC GASTROSTOMY TUBE PLACEMENT;  Surgeon: Greer Pickerel, MD;  Location: [facility];  Service: General;  Laterality: N/A;  . LAPAROSCOPIC ROUX-EN-Y GASTRIC BYPASS WITH HIATAL HERNIA REPAIR N/A 07/08/2015   Procedure: LAPAROSCOPIC ROUX-EN-Y GASTRIC BYPASS WITH HIATAL HERNIA REPAIR WITH UPPER ENDOSCOPY;  Surgeon: Greer Pickerel, MD;  Location: [facility];  Service: General;  Laterality: N/A;  . LAPAROSCOPIC SMALL BOWEL RESECTION N/A 09/30/2015   Procedure: LAPAROSCOPIC REVISION OF ROUX LIMB;  Surgeon: Greer Pickerel, MD;  Location: [facility];  Service: General;  Laterality: N/A;  . LAPAROSCOPY N/A 09/30/2015   Procedure: LAPAROSCOPY DIAGNOSTIC;  Surgeon: Greer Pickerel, MD;  Location: [facility];  Service: General;  Laterality: N/A;  . TUBAL LIGATION    . WISDOM TOOTH EXTRACTION      OB History    No data available       Home Medications    Prior to Admission medications   Medication Sig Start Date End Date Taking? Authorizing Provider  amitriptyline (ELAVIL) 10 MG tablet Take 2 tablets (20 mg total) by mouth at bedtime. 09/14/16   Melvenia Beam, MD  amphetamine-dextroamphetamine (ADDERALL) 10 MG tablet Take 1 tablet (10 mg total) by mouth 2 (two) times daily. 02/02/17   Shawnee Knapp, MD  CALCIUM-VITAMIN D PO Take by mouth 3 (three) times daily. chewable    [provider]  cetirizine (ZYRTEC) 10 MG tablet Take 1 tablet (10 mg total) by mouth at bedtime. 05/25/16   Shawnee Knapp, MD  clonazePAM (KLONOPIN) 0.5 MG tablet Take 1 tablet (0.5 mg total) by mouth 3 (three) times daily as needed for anxiety. 12/04/16   Shawnee Knapp, MD  Cyanocobalamin (VITAMIN B-12) 1000 MCG/15ML LIQD Take 5 mLs by mouth daily.    [provider]  docusate sodium (COLACE) 100 MG capsule Take 100 mg by mouth daily.    [provider]  eletriptan (RELPAX) 40 MG  tablet  08/04/16   [provider]  fluticasone (FLONASE) 50 MCG/ACT nasal spray Place 2 sprays into both nostrils at bedtime. 05/25/16   Shawnee Knapp, MD  hydrocortisone (ANUSOL-HC) 25 MG suppository Place 1 suppository (25 mg total) rectally 2 (two) times daily. - after a bowel movement if possible Patient taking differently: Place 25 mg rectally 2 (two) times daily as needed. - after a bowel movement if possible 05/25/16   Shawnee Knapp, MD  ipratropium (ATROVENT) 0.03 % nasal spray Place 2 sprays into the nose 4 (four) times daily. 05/25/16   Shawnee Knapp, MD  lactulose (CHRONULAC) 10 GM/15ML solution Take 30 g by mouth daily as needed for moderate constipation.  01/14/16   [provider]  linaclotide Rolan Lipa) 145 MCG CAPS capsule Take 1 capsule (145 mcg total) by mouth daily before breakfast. As needed for chronic constipation 10/26/16   Shawnee Knapp, MD  Multiple Vitamin (MULTIVITAMIN) LIQD Take by mouth 2 (two) times daily. chewable    [provider]  Nutritional Supplements (FEEDING SUPPLEMENT, VITAL HIGH  PROTEIN,) LIQD liquid Place 1,000 mLs into feeding tube daily. 44ml/hr for 8hrs a day as needed for poor oral intake; 300 ml free water q4hr Patient taking differently: Place 1,000 mLs into feeding tube daily. 11ml/hr for 8hrs a day as needed for poor oral intake; 300 ml free water q4hr 02/24/16   Greer Pickerel, MD  ondansetron (ZOFRAN) 4 MG tablet Take 1 tablet (4 mg total) by mouth every 6 (six) hours. 12/27/16   Varney Biles, MD  ondansetron (ZOFRAN-ODT) 4 MG disintegrating tablet Take 1 tablet (4 mg total) by mouth every 6 (six) hours as needed for nausea. 02/24/16   Greer Pickerel, MD  oxyCODONE (OXY IR/ROXICODONE) 5 MG immediate release tablet Take 1-2 tablets (5-10 mg total) by mouth every 4 (four) hours as needed for moderate pain. 10/26/16   Shawnee Knapp, MD  oxyCODONE-acetaminophen (ROXICET) 5-325 MG tablet Take 1 tablet by mouth every 8 (eight) hours as needed for  severe pain. 12/27/16   Varney Biles, MD  pantoprazole (PROTONIX) 40 MG tablet Take 1 tablet (40 mg total) by mouth daily. 02/24/16   Greer Pickerel, MD  polyethylene glycol [facility] / Floria Raveling) packet Take 17 g by mouth daily.    [provider]  simethicone (MYLICON) 80 MG chewable tablet Chew 80 mg by mouth every 6 (six) hours as needed for flatulence.    [provider]  topiramate (TOPAMAX) 50 MG tablet Take 1 tablet (50 mg total) by mouth daily. Patient taking differently: Take 150 mg by mouth daily.  05/25/16   Melvenia Beam, MD  [name] 150-35 MCG/24HR transdermal patch Place 1 patch onto the skin once a week.  02/11/16   [provider]    Family History Family History  Problem Relation Age of Onset  . Cancer Mother   . Stroke Father   . Diabetes Brother   . Seizures Son   . Migraines Neg Hx     Social History Social History  Substance Use Topics  . Smoking status: Never Smoker  . Smokeless tobacco: Never Used  . Alcohol use Yes     Comment: socially     Allergies   Sulfa antibiotics and Zosyn [piperacillin sod-tazobactam so]   Review of Systems Review of Systems  Constitutional: Positive for activity change.  Respiratory: Negative for shortness of breath.   Cardiovascular: Negative for chest pain.  Gastrointestinal: Positive for abdominal distention, abdominal pain and nausea. Negative for diarrhea.  All other systems reviewed and are negative.    Physical Exam Updated Vital Signs BP 105/69 (BP Location: Right Arm)   Pulse (!) 52   Temp (!) 97.4 F (36.3 C) (Oral)   Resp 16   SpO2 100%   Physical Exam  Constitutional: She is oriented to person, place, and time. She appears well-developed.  HENT:  Head: Normocephalic and atraumatic.  Eyes: EOM are normal.  Neck: Normal range of motion. Neck supple.  Cardiovascular: Normal rate.   Pulmonary/Chest: Effort normal.  Abdominal: Soft. Bowel sounds are normal. She exhibits  distension. There is tenderness. There is no rebound and no guarding.  Neurological: She is alert and oriented to person, place, and time.  Skin: Skin is warm and dry. Capillary refill takes less than 2 seconds.  Nursing note and vitals reviewed.    ED Treatments / Results  Labs (all labs ordered are listed, but only abnormal results are displayed) Labs Reviewed  COMPREHENSIVE METABOLIC PANEL - Abnormal; Notable for the following:  Result Value   Creatinine, Ser 1.10 (*)    Calcium 7.9 (*)    Total Protein 5.9 (*)    Albumin 3.0 (*)    GFR calc non Af Amer 59 (*)    All other components within normal limits  CBC WITH DIFFERENTIAL/PLATELET - Abnormal; Notable for the following:    RBC 3.37 (*)    Hemoglobin 10.5 (*)    HCT 31.7 (*)    All other components within normal limits  LIPASE, BLOOD    EKG  EKG Interpretation None       Radiology No results found.  Procedures Procedures (including critical care time)  Medications Ordered in ED Medications  HYDROmorphone (DILAUDID) injection 1 mg (1 mg Intravenous Given 12/26/16 2332)  ondansetron (ZOFRAN) injection 4 mg (4 mg Intravenous Given 12/26/16 2331)  sodium chloride 0.9 % bolus 1,000 mL (0 mLs Intravenous Stopped 12/27/16 0118)  fentaNYL (SUBLIMAZE) injection 50 mcg (50 mcg Intravenous Given 12/27/16 0035)  metoCLOPramide (REGLAN) injection 10 mg (10 mg Intravenous Given 12/27/16 0034)  oxyCODONE-acetaminophen (PERCOCET/ROXICET) 5-325 MG per tablet 1 tablet (1 tablet Oral Given 12/27/16 0118)  ondansetron (ZOFRAN-ODT) disintegrating tablet 4 mg (4 mg Oral Given 12/27/16 0118)     Initial Impression / Assessment and Plan / ED Course  I have reviewed the triage vital signs and the nursing notes.  Pertinent labs & imaging results that were available during my care of the patient were reviewed by me and considered in my medical decision making (see chart for details).  Clinical Course as of Dec 28 254  Sun  Dec 27, 2016  0100 Pain improved after Dilaudid and then fentanyl. Nausea is not controlled as well. We will give patient some oral medicine and start oral challenge, he does well we will discharge  [AN]  0255 Nurse reported that the patient's pain was down to 1 out of 10 and she felt a lot better.  Patient did also pass p.o. challenge at that time therefore she was discharged.  [place] controlled substance database was reviewed for this patient, and it shows no multiple prescribers. It seems that primary doctor last dispensed pain meds 2 months ago. 6 tabs of oxycodone dispensed.   [AN]    Clinical Course User Index [AN] Varney Biles, MD    46 year old female with history of IIH, gastric Roux-en-Y surgery, G-tube placement for malnutrition, multiple abdominal surgeries who comes in with chief complaint of abdominal pain.  Patient has epigastric abdominal pain that was sudden onset after p.o. intake.  Patient has had similar pain with similar evoking factors in the past.  CT scan from December 08, 2014 reviewed.  Abdominal exam is non-peritoneal at this time.   Given that the pain was sudden onset and is similar to prior attacks, we will start with basic labs.  My suspicion for small bowel obstruction, pancreatitis, gastritis is low.  Upper endoscopy from within the last 1 year was reviewed and it showed no ulcers or inflammation.  We will get advanced imaging only if the labs are abnormal or if patient's pain persists.  Final Clinical Impressions(s) / ED Diagnoses   Final diagnoses:  Sudden onset of severe abdominal pain    New Prescriptions Discharge Medication List as of 12/27/2016  2:36 AM    START taking these medications   Details  ondansetron (ZOFRAN) 4 MG tablet Take 1 tablet (4 mg total) by mouth every 6 (six) hours., Starting Sun 12/27/2016, Print    oxyCODONE-acetaminophen (ROXICET) 5-325  MG tablet Take 1 tablet by mouth every 8 (eight) hours as needed for severe  pain., Starting Sun 12/27/2016, [place], [place], MD 12/27/16 [phone]

## 2016-12-28 MED FILL — TOPIRAMATE 50 MG TABLET: 50 | 30 days supply | Qty: 30 | Fill #2

## 2016-12-28 MED FILL — ONDANSETRON HCL 4 MG TABLET: 4 | 3 days supply | Qty: 12 | Fill #0

## 2016-12-28 MED FILL — AMITRIPTYLINE HCL 10 MG TAB: 10 | 30 days supply | Qty: 60 | Fill #2

## 2016-12-28 MED FILL — OXYCOD/ACETAMINOPHEN 5-325M: 5-325 | 2 days supply | Qty: 6 | Fill #0

## 2016-12-28 MED FILL — XULANE PATCH: 150-35 | 21 days supply | Qty: 3 | Fill #10

## 2016-12-29 ENCOUNTER — Encounter: Payer: Self-pay | Admitting: Family Medicine

## 2016-12-29 MED ORDER — OXYCODONE HCL 5 MG PO TABS: 5.0000 mg | ORAL_TABLET | ORAL | 0 refills | Status: DC | PRN

## 2016-12-29 MED FILL — oxyCODONE HCL 5 MG TABS: 5 | 5 days supply | Qty: 60 | Fill #0

## 2016-12-29 NOTE — Telephone Encounter (Signed)
Please advise 

## 2016-12-29 NOTE — Telephone Encounter (Signed)
Rx ready for pick-up at 102.  Radiographer, therapeutic. letting pt know that as I was out of town, the request was sent to the wrong provider, hence the delay. My apologies that this took [name] so long to get back to her. ----  [facility] reviewed. Last rx from me was 8/22 for #60 oxycodone 5mg .  Pt did fill #6 tabs of percocet 5 yest from ER visit 3d ago - which was done for this same abd pain - since hear from our office about a refill.

## 2016-12-29 NOTE — Telephone Encounter (Signed)
Pt advised.

## 2017-01-05 ENCOUNTER — Ambulatory Visit: Payer: BLUE CROSS/BLUE SHIELD | Admitting: Psychiatry

## 2017-01-05 NOTE — Progress Notes (Signed)
Subjective:    Patient ID: Margaret Fletcher, female    DOB: 04/30/1970, 46 y.o.   MRN: [id number]   Chief Complaint  Patient presents with  . Letter for School/Work    and recheck kidneys   HPI Memory loss/Poor concentration: sig worsened since mul med issues - IIH and then digestive issues. Started on adderall 10 bid earlier this yr which is helping a little. Pt reports neurology did not think that this stimulant therapy would adversely effect the IIH dz process.  The adderall also helps her feel like she is not as antsy or figidty so she takes one in the morning and one in the afternoon. The adderall does help her get more stuff done in the morning - helps her focus and think through things but then as her topamax has been increased to 100mg  which gives her a brain fog. Sxs depend day to day but worsen on days when she isn't eating as well and has more pain.   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 Follows closely with neurology Dr. Jaynee Eagles. On elavil 20mg  qhs. 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 Causing sig photophobia and worsening HAs and now unable to drive due to lack of depth perception. Has been increasing in frequency for the past several weeks - didn't know if it might be the change in weather.  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 Failed trial of reglan but only would take 5mg  qd. Failed trial of remeron 20mg  but did not take for a whole mo.  Today she is having worse head pain. Hypocalcemia:  Lab Results  Component Value Date   CALCIUM 7.9 (L) 12/26/2016   CALCIUM 7.9 (L) 12/10/2016   CALCIUM 8.7 10/26/2016   CALCIUM 8.4 (L) 08/13/2016   CALCIUM 8.3 (L) 04/10/2016   Malnutrition: Currently  she is tolerating the protein shake mixed w/ coffee.  Lab Results  Component Value Date   ALBUMIN 3.0 (L) 12/26/2016   ALBUMIN 2.7 (L) 12/10/2016   ALBUMIN 3.7 10/26/2016   ALBUMIN 3.2 (L) 08/13/2016   ALBUMIN 3.3 (L) 04/10/2016   She does takes 1 dropper full of vit B daily - is in the bariatric vitamins/supp she gets form [facility].  Chronic constipation predominately alt w/ diarrhea - linzess prn as alternates with diarrhea per surgery (Dr. Greer Pickerel). However, had acute gastroenteritis after eating a salad at a restaurant over the summer and has then had more trouble eating with more flairs of nausea, early satiety, and bloating. Still seeing nutritionist and had some IV hydration at [facility] from surgeon.  Still has hemorrhoids  Muscle cramps worsening over past sev mos. Tried mustard supplements. Electrolytes, tsh were normal but pt was having her creatinine slowly increasing. Dr. Redmond Pulling thought she might be dehydrated so had pt get IVFs at the hosp several times about a month ago after which her Cr did improved (1.2 -> 1.1).  Adjustment d/o w/ mixed anxiety/depression: since developed debilitating/consuming disease/complications.     She does think klonopin 0.5mg  qhs and 0.25mg  qam helps - feels not as antsy or figidty - like she can take a deep breath. She takes the amitriptyline and a klonopin around 8 pm in which  case she can sleep all night without feeling draggy the next morning. At last visit 2 mos prior, referred pt to Dr. Ferdinand Lango w/ [facility] and saw her once - has a next appointment lined up for next week.   She has had several episodes of night sweats to where she has to get up o/n to change her shirt.  Doesn't really endorse any other menopausal sxs. She reports that this has happened periodically since her surgeries last year.    Past Medical History:  Diagnosis Date  . Alopecia   . Anxiety   . Autoimmune disease ([place])    "Alopecia"  . Bacterial vaginosis   .  Cyst of spleen   . Depression   . Frequent UTI   . Hypertrophy of breast   . IIH (idiopathic intracranial hypertension) 2017   "related to headaches"  . Migraine headache    denies, ruled out - Propranolol.Using for headaches   Past Surgical History:  Procedure Laterality Date  . APPENDECTOMY    . BREAST REDUCTION SURGERY Bilateral 03/26/2015   Procedure: BILATERAL BREAST REDUCTION  ;  Surgeon: Youlanda Roys, MD;  Location: [facility];  Service: Plastics;  Laterality: Bilateral;  . BREATH TEK H PYLORI N/A 05/21/2014   Procedure: BREATH TEK H PYLORI;  Surgeon: Greer Pickerel, MD;  Location: [facility];  Service: General;  Laterality: N/A;  . CESAREAN SECTION    . CESAREAN SECTION    . CHOLECYSTECTOMY    . DILITATION & CURRETTAGE/HYSTROSCOPY WITH NOVASURE ABLATION N/A 07/19/2012   Procedure: HYSTEROSCOPY WITH NOVASURE ABLATION;  Surgeon: Farrel Gobble. Harrington Challenger, MD;  Location: [facility];  Service: Gynecology;  Laterality: N/A;  . ENDOMETRIAL ABLATION    . ESOPHAGOGASTRODUODENOSCOPY N/A 08/16/2015   Procedure: UPPER ESOPHAGOGASTRODUODENOSCOPY (EGD);  Surgeon: Greer Pickerel, MD;  Location: [facility];  Service: General;  Laterality: N/A;  . ESOPHAGOGASTRODUODENOSCOPY N/A 02/21/2016   Procedure: ESOPHAGOGASTRODUODENOSCOPY (EGD);  Surgeon: Greer Pickerel, MD;  Location: [facility];  Service: General;  Laterality: N/A;  . IR GENERIC HISTORICAL  10/04/2015   IR [place] Vivianne Master 10/04/2015 Markus Daft, MD [facility]  . IR GENERIC HISTORICAL  01/31/2016   IR [place] GASTRO/COLONIC TUBE PERCUT W/FLUORO 01/31/2016 Corrie Mckusick, DO [facility]  . IR GENERIC HISTORICAL  04/06/2016   IR [place] TUBE PERCUT W/FLUORO 04/06/2016 Arne Cleveland, MD [facility]  . IR GENERIC HISTORICAL  05/14/2016   IR GASTRIC TUBE PERC CHG W/O IMG GUIDE 05/14/2016 Sandi Mariscal, MD [facility]  . LAPAROSCOPIC GASTROSTOMY N/A 09/30/2015   Procedure: LAPAROSCOPIC GASTROSTOMY TUBE  PLACEMENT;  Surgeon: Greer Pickerel, MD;  [facility];  Service: General;  Laterality: N/A;  . LAPAROSCOPIC ROUX-EN-Y GASTRIC BYPASS WITH HIATAL HERNIA REPAIR N/A 07/08/2015   Procedure: LAPAROSCOPIC ROUX-EN-Y GASTRIC BYPASS WITH HIATAL HERNIA REPAIR WITH UPPER ENDOSCOPY;  Surgeon: Greer Pickerel, MD;  [facility];  Service: General;  Laterality: N/A;  . LAPAROSCOPIC SMALL BOWEL RESECTION N/A 09/30/2015   Procedure: LAPAROSCOPIC REVISION OF ROUX LIMB;  Surgeon: Greer Pickerel, MD;  [facility];  Service: General;  Laterality: N/A;  . LAPAROSCOPY N/A 09/30/2015   Procedure: LAPAROSCOPY DIAGNOSTIC;  Surgeon: Greer Pickerel, MD;  [facility];  Service: General;  Laterality: N/A;  . TUBAL LIGATION    . WISDOM TOOTH EXTRACTION     Current Outpatient Prescriptions on File Prior to Visit  Medication Sig Dispense Refill  . amitriptyline (ELAVIL) 10 MG tablet Take 2 tablets (20 mg total) by mouth  at bedtime. 60 tablet 11  . [START ON 02/02/2017] amphetamine-dextroamphetamine (ADDERALL) 10 MG tablet Take 1 tablet (10 mg total) by mouth 2 (two) times daily. 60 tablet 0  . CALCIUM-VITAMIN D PO Take by mouth 3 (three) times daily. chewable    . cetirizine (ZYRTEC) 10 MG tablet Take 1 tablet (10 mg total) by mouth at bedtime. 30 tablet 11  . clonazePAM (KLONOPIN) 0.5 MG tablet Take 1 tablet (0.5 mg total) by mouth 3 (three) times daily as needed for anxiety. 90 tablet 0  . Cyanocobalamin (VITAMIN B-12) 1000 MCG/15ML LIQD Take 5 mLs by mouth daily.    Marland Kitchen docusate sodium (COLACE) 100 MG capsule Take 100 mg by mouth daily.    Marland Kitchen eletriptan (RELPAX) 40 MG tablet   11  . fluticasone (FLONASE) 50 MCG/ACT nasal spray Place 2 sprays into both nostrils at bedtime. 16 g 2  . hydrocortisone (ANUSOL-HC) 25 MG suppository Place 1 suppository (25 mg total) rectally 2 (two) times daily. - after a bowel movement if possible (Patient taking differently: Place 25 mg rectally 2 (two) times daily as needed. - after a bowel  movement if possible) 24 suppository 1  . ipratropium (ATROVENT) 0.03 % nasal spray Place 2 sprays into the nose 4 (four) times daily. 30 mL 1  . lactulose (CHRONULAC) 10 GM/15ML solution Take 30 g by mouth daily as needed for moderate constipation.   0  . linaclotide (LINZESS) 145 MCG CAPS capsule Take 1 capsule (145 mcg total) by mouth daily before breakfast. As needed for chronic constipation 30 capsule 5  . Multiple Vitamin (MULTIVITAMIN) LIQD Take by mouth 2 (two) times daily. chewable    . Nutritional Supplements (FEEDING SUPPLEMENT, VITAL HIGH PROTEIN,) LIQD liquid Place 1,000 mLs into feeding tube daily. 25ml/hr for 8hrs a day as needed for poor oral intake; 300 ml free water q4hr (Patient taking differently: Place 1,000 mLs into feeding tube daily. 27ml/hr for 8hrs a day as needed for poor oral intake; 300 ml free water q4hr) 1000 mL 8  . ondansetron (ZOFRAN) 4 MG tablet Take 1 tablet (4 mg total) by mouth every 6 (six) hours. 12 tablet 0  . ondansetron (ZOFRAN-ODT) 4 MG disintegrating tablet Take 1 tablet (4 mg total) by mouth every 6 (six) hours as needed for nausea. 30 tablet 0  . oxyCODONE (OXY IR/ROXICODONE) 5 MG immediate release tablet Take 1-2 tablets (5-10 mg total) by mouth every 4 (four) hours as needed for moderate pain. 60 tablet 0  . oxyCODONE-acetaminophen (ROXICET) 5-325 MG tablet Take 1 tablet by mouth every 8 (eight) hours as needed for severe pain. 6 tablet 0  . pantoprazole (PROTONIX) 40 MG tablet Take 1 tablet (40 mg total) by mouth daily. 30 tablet 1  . polyethylene glycol (MIRALAX / GLYCOLAX) packet Take 17 g by mouth daily.    . simethicone (MYLICON) 80 MG chewable tablet Chew 80 mg by mouth every 6 (six) hours as needed for flatulence.    . topiramate (TOPAMAX) 50 MG tablet Take 1 tablet (50 mg total) by mouth daily. (Patient taking differently: Take 150 mg by mouth daily. ) 30 tablet 11  . XULANE 150-35 MCG/24HR transdermal patch Place 1 patch onto the skin once a  week.   11   No current facility-administered medications on file prior to visit.    Allergies  Allergen Reactions  . Sulfa Antibiotics Anaphylaxis  . Zosyn [Piperacillin Sod-Tazobactam So] Rash   Family History  Problem Relation Age of Onset  .  Cancer Mother   . Stroke Father   . Diabetes Brother   . Seizures Son   . Migraines Neg Hx    Social History   Social History  . Marital status: Divorced    Spouse name: N/A  . Number of children: 2  . Years of education: 16   Occupational History  . [employer]- nurse tech    Social History Main Topics  . Smoking status: Never Smoker  . Smokeless tobacco: Never Used  . Alcohol use Yes     Comment: socially  . Drug use: No  . Sexual activity: Yes    Birth control/ protection: Surgical   Other Topics Concern  . Not on file   Social History Narrative   Lives with partner   Caffeine use: minimal coffee   Right-handed   Depression screen [facility] 2/9 10/26/2016 10/12/2016 07/18/2016 06/25/2016 06/11/2016  Decreased Interest 3 0 0 1 0  Down, Depressed, Hopeless 3 0 1 1 0  PHQ - 2 Score 6 0 1 2 0  Altered sleeping 2 - - 1 -  Tired, decreased energy 2 - - 1 -  Change in appetite 2 - - 3 -  Feeling bad or failure about yourself  0 - - 1 -  Trouble concentrating 1 - - 1 -  Moving slowly or fidgety/restless 3 - - 1 -  Suicidal thoughts 0 - - 0 -  PHQ-9 Score 16 - - 10 -  Difficult doing work/chores Extremely dIfficult - - Somewhat difficult -    Review of Systems See hpi    Objective:   Physical Exam  Constitutional: She is oriented to person, place, and time. She appears well-developed and well-nourished. No distress.  HENT:  Head: Normocephalic and atraumatic.  Right Ear: External ear normal.  Left Ear: External ear normal.  Eyes: Conjunctivae are normal. No scleral icterus.  Neck: Normal range of motion. Neck supple. No thyromegaly present.  Cardiovascular: Normal rate, regular rhythm, normal heart sounds and intact distal  pulses.   Pulmonary/Chest: Effort normal and breath sounds normal. No respiratory distress.  Musculoskeletal: She exhibits no edema.  Lymphadenopathy:    She has no cervical adenopathy.  Neurological: She is alert and oriented to person, place, and time.  Skin: Skin is warm and dry. She is not diaphoretic. No erythema.  Psychiatric: Her behavior is normal. She exhibits a depressed mood.     BP 116/78   Pulse 76   Temp 98.9 F (37.2 C)   Resp 16   Ht 5\' 8"  (1.727 m)   Wt 180 lb 3.2 oz (81.7 kg)   SpO2 100%   BMI 27.40 kg/m   Results for orders placed or performed in visit on 01/07/17  POCT urinalysis dipstick  Result Value Ref Range   Color, UA yellow yellow   Clarity, UA clear clear   Glucose, UA negative negative mg/dL   Bilirubin, UA small (A) negative   Ketones, POC UA trace (5) (A) negative mg/dL   Spec Grav, UA 1.025 1.010 - 1.025   Blood, UA negative negative   pH, UA 5.5 5.0 - 8.0   Protein Ur, POC negative negative mg/dL   Urobilinogen, UA 1.0 0.2 or 1.0 E.U./dL   Nitrite, UA Negative Negative   Leukocytes, UA Negative Negative  POCT Microscopic Urinalysis ([facility])  Result Value Ref Range   WBC,UR,HPF,POC None None WBC/hpf   RBC,UR,HPF,POC Few (A) None RBC/hpf   Bacteria None None, Too numerous to count  Mucus Absent Absent   Epithelial Cells, UR Per Microscopy Few (A) None, Too numerous to count cells/hpf    Assessment & Plan:  Ok to refill adderall 10 bid x 3 mos when requested. Will be due for refill in 2 mos - 12/27 Oxycodone #60/mo - 12/23 or later. Klonopin - 1/10  1. Memory loss due to medical condition   2. Epigastric pain   3. G tube feedings ([place])   4. Adjustment disorder with mixed anxiety and depressed mood   5. Debility   6. Protein-calorie malnutrition, moderate ([place])   7. Constipation, chronic   8. IIH (idiopathic intracranial hypertension)     Orders Placed This Encounter  Procedures  . POCT urinalysis dipstick  . POCT Microscopic  Urinalysis ([facility])    Meds ordered this encounter  Medications  . hydrocortisone (ANUSOL-HC) 25 MG suppository    Sig: Place 1 suppository (25 mg total) rectally 2 (two) times daily as needed for hemorrhoids. - after a bowel movement if possible    Dispense:  24 suppository    Refill:  1  . eletriptan (RELPAX) 40 MG tablet    Sig: Take 1 tablet (40 mg total) by mouth every 2 (two) hours as needed for migraine or headache. Do not use >2 doses/24 hours    Dispense:  10 tablet    Refill:  11  . metoCLOPramide (REGLAN) 5 MG tablet    Sig: Take 1 tablet (5 mg total) by mouth 4 (four) times daily -  before meals and at bedtime.    Dispense:  120 tablet    Refill:  1  . clonazePAM (KLONOPIN) 0.5 MG tablet    Sig: Take 1 tablet (0.5 mg total) by mouth 3 (three) times daily as needed for anxiety.    Dispense:  90 tablet    Refill:  0    May fill on or after 02/03/2017  . oxyCODONE (OXY IR/ROXICODONE) 5 MG immediate release tablet    Sig: Take 1-2 tablets (5-10 mg total) by mouth every 4 (four) hours as needed for moderate pain.    Dispense:  60 tablet    Refill:  0    [name], M.D.  Primary Care at [facility] [address] [place], [place] [address] [phone] phone [phone] fax  01/14/17 6:19 AM

## 2017-01-07 ENCOUNTER — Encounter: Payer: Self-pay | Admitting: Family Medicine

## 2017-01-07 ENCOUNTER — Ambulatory Visit (INDEPENDENT_AMBULATORY_CARE_PROVIDER_SITE_OTHER): Payer: BLUE CROSS/BLUE SHIELD | Admitting: Family Medicine

## 2017-01-07 VITALS — BP 116/78 | HR 76 | Temp 98.9°F | Resp 16 | Ht 68.0 in | Wt 180.2 lb

## 2017-01-07 DIAGNOSIS — R5381 Other malaise: Secondary | ICD-10-CM

## 2017-01-07 DIAGNOSIS — K5909 Other constipation: Secondary | ICD-10-CM | POA: Diagnosis not present

## 2017-01-07 DIAGNOSIS — G932 Benign intracranial hypertension: Secondary | ICD-10-CM | POA: Diagnosis not present

## 2017-01-07 DIAGNOSIS — E440 Moderate protein-calorie malnutrition: Secondary | ICD-10-CM

## 2017-01-07 DIAGNOSIS — R1013 Epigastric pain: Secondary | ICD-10-CM

## 2017-01-07 DIAGNOSIS — F4323 Adjustment disorder with mixed anxiety and depressed mood: Secondary | ICD-10-CM | POA: Diagnosis not present

## 2017-01-07 DIAGNOSIS — Z931 Gastrostomy status: Secondary | ICD-10-CM

## 2017-01-07 DIAGNOSIS — R413 Other amnesia: Secondary | ICD-10-CM | POA: Diagnosis not present

## 2017-01-07 LAB — POCT URINALYSIS DIP (MANUAL ENTRY)
Blood, UA: NEGATIVE
Glucose, UA: NEGATIVE mg/dL
Leukocytes, UA: NEGATIVE
Nitrite, UA: NEGATIVE
PH UA: 5.5 (ref 5.0–8.0)
PROTEIN UA: NEGATIVE mg/dL
SPEC GRAV UA: 1.025 (ref 1.010–1.025)
Urobilinogen, UA: 1 E.U./dL

## 2017-01-07 LAB — POC MICROSCOPIC URINALYSIS (UMFC): MUCUS RE: ABSENT

## 2017-01-07 MED ORDER — METOCLOPRAMIDE HCL 5 MG PO TABS: 5.0000 mg | ORAL_TABLET | Freq: Three times a day (TID) | ORAL | 1 refills | Status: DC

## 2017-01-07 MED ORDER — ELETRIPTAN HYDROBROMIDE 40 MG PO TABS: 40.0000 mg | ORAL_TABLET | ORAL | 11 refills | Status: DC | PRN

## 2017-01-07 MED ORDER — OXYCODONE HCL 5 MG PO TABS: 5.0000 mg | ORAL_TABLET | ORAL | 0 refills | Status: DC | PRN

## 2017-01-07 MED ORDER — HYDROCORTISONE ACETATE 25 MG RE SUPP: 25.0000 mg | Freq: Two times a day (BID) | RECTAL | 1 refills | Status: DC | PRN

## 2017-01-07 MED ORDER — CLONAZEPAM 0.5 MG PO TABS: 0.5000 mg | ORAL_TABLET | Freq: Three times a day (TID) | ORAL | 0 refills | Status: DC | PRN

## 2017-01-07 MED FILL — ELETRIPTAN HBR 40 MG TABLET: 40 | 30 days supply | Qty: 10 | Fill #0

## 2017-01-07 MED FILL — METOCLOPRAMIDE 5 MG TABLET: 5 | 30 days supply | Qty: 120 | Fill #0

## 2017-01-07 NOTE — Patient Instructions (Addendum)
Try taking the metoclopramide 30 minutes before a meal and before bed.    IF you received an x-ray today, you will receive an invoice from [facility]. Please contact [facility] at [phone] with questions or concerns regarding your invoice.   IF you received labwork today, you will receive an invoice from [facility]. Please contact [facility] at [phone] with questions or concerns regarding your invoice.   Our billing staff will not be able to assist you with questions regarding bills from these companies.  You will be contacted with the lab results as soon as they are available. The fastest way to get your results is to activate your My Chart account. Instructions are located on the last page of this paperwork. If you have not heard from [name] regarding the results in 2 weeks, please contact this office.      Gastroparesis Gastroparesis, also called delayed gastric emptying, is a condition in which food takes longer than normal to empty from the stomach. The condition is usually long-lasting (chronic). What are the causes? This condition may be caused by:  An endocrine disorder, such as hypothyroidism or diabetes. Diabetes is the most common cause of this condition.  A nervous system disease, such as Parkinson disease or multiple sclerosis.  Cancer, infection, or surgery of the stomach or vagus nerve.  A connective tissue disorder, such as scleroderma.  Certain medicines.  In most cases, the cause is not known. What increases the risk? This condition is more likely to develop in:  People with certain disorders, including endocrine disorders, eating disorders, amyloidosis, and scleroderma.  People with certain diseases, including Parkinson disease or multiple sclerosis.  People with cancer or infection of the stomach or vagus nerve.  People who have had surgery on the stomach or vagus nerve.  People who take certain medicines.  Women.  What are the  signs or symptoms? Symptoms of this condition include:  An early feeling of fullness when eating.  Nausea.  Weight loss.  Vomiting.  Heartburn.  Abdominal bloating.  Inconsistent blood glucose levels.  Lack of appetite.  Acid from the stomach coming up into the esophagus (gastroesophageal reflux).  Spasms of the stomach.  Symptoms may come and go. How is this diagnosed? This condition is diagnosed with tests, such as:  Tests that check how long it takes food to move through the stomach and intestines. These tests include: ? Upper gastrointestinal (GI) series. In this test, X-rays of the intestines are taken after you drink a liquid. The liquid makes the intestines show up better on the X-rays. ? Gastric emptying scintigraphy. In this test, scans are taken after you eat food that contains a small amount of radioactive material. ? Wireless capsule GI monitoring system. This test involves swallowing a capsule that records information about movement through the stomach.  Gastric manometry. This test measures electrical and muscular activity in the stomach. It is done with a thin tube that is passed down the throat and into the stomach.  Endoscopy. This test checks for abnormalities in the lining of the stomach. It is done with a long, thin tube that is passed down the throat and into the stomach.  An ultrasound. This test can help rule out gallbladder disease or pancreatitis as a cause of your symptoms. It uses sound waves to take pictures of the inside of your body.  How is this treated? There is no cure for gastroparesis. This condition may be managed with:  Treatment of the underlying condition causing  the gastroparesis.  Lifestyle changes, including exercise and dietary changes. Dietary changes can include: ? Changes in what and when you eat. ? Eating smaller meals more often. ? Eating low-fat foods. ? Eating low-fiber forms of high-fiber foods, such as cooked  vegetables instead of raw vegetables. ? Having liquid foods in place of solid foods. Liquid foods are easier to digest.  Medicines. These may be given to control nausea and vomiting and to stimulate stomach muscles.  Getting food through a feeding tube. This may be done in severe cases.  A gastric neurostimulator. This is a device that is inserted into the body with surgery. It helps improve stomach emptying and control nausea and vomiting.  Follow these instructions at home:  Follow your health care provider's instructions about exercise and diet.  Take medicines only as directed by your health care provider. Contact a health care provider if:  Your symptoms do not improve with treatment.  You have new symptoms. Get help right away if:  You have severe abdominal pain that does not improve with treatment.  You have nausea that does not go away.  You cannot keep fluids down. This information is not intended to replace advice given to you by your health care provider. Make sure you discuss any questions you have with your health care provider. Document Released: 02/23/2005 Document Revised: 08/01/2015 Document Reviewed: 02/19/2014 Elsevier Interactive Patient Education  [employer].

## 2017-01-11 ENCOUNTER — Encounter: Payer: Self-pay | Admitting: Skilled Nursing Facility1

## 2017-01-11 ENCOUNTER — Encounter: Payer: BLUE CROSS/BLUE SHIELD | Attending: General Surgery | Admitting: Skilled Nursing Facility1

## 2017-01-11 DIAGNOSIS — Z713 Dietary counseling and surveillance: Secondary | ICD-10-CM | POA: Diagnosis present

## 2017-01-11 DIAGNOSIS — Z931 Gastrostomy status: Secondary | ICD-10-CM

## 2017-01-11 MED FILL — DEXTROAMP-AMP 10 MG TAB: 10 | 30 days supply | Qty: 60 | Fill #0

## 2017-01-11 NOTE — Progress Notes (Signed)
RYGB Surgery  Medical Nutrition Therapy:  Appt start time: 400 end time: 435  Primary concerns today: Post-operative Bariatric Surgery Nutrition Management.  Margaret Fletcher returns for a bariatric nutrition follow up post op RYGB.   Pt states she is still trying to get into a teaching hospital.  Recall: 1 8 ounce ensure enlive: 20g protein and 350 calories 3g fiber 30 ounces Regular Coffee with egg nog and whole premier protein 34g protein Maybe sometimes half burger with bun  Grilled vegetables and and grilled chicken resulting in pains in stomach and distended abdomen the next morning feeling overly full then 2-3 days of nothing: sips here and there with 6 ounces of orange juice with miralax.  Wakes up 6am in bed at 10pm so waking hours from 6am-9pm.   Surgery date: 07/08/2015 Surgery type: RYGB Start weight at [facility]: 278 lbs on 05/24/2014 Weight today: 181 lbs Weight change: 10lbs gained  TANITA  BODY COMP RESULTS  07/01/15 07/23/15 10/31/15 02/07/16 03/16/16 04/20/16 06/22/2016 08/24/2016 11/23/2016 01/11/2017   BMI (kg/m^2) 40.4 38.6 33.4 30.4 29 28.3 28.5 29.3 25.8 27.6   Fat Mass (lbs) 138.5 131.0 103.2 84 80 74 69.6 67.8 54.4 58.8   Fat Free Mass (lbs) 127.5 122.8 116.4 116.2 110.6 112.2 118 119.4 115.6 122.4   Total Body Water (lbs) 93.5 90.4 84.4 83.4 79.2 80.2 84.2 85.2 82.4 87.2    Preferred Learning Style:   No preference indicated   Learning Readiness:   Ready  Fluid intake: 24 oz water and tea + 8oz can enteral formula  Estimated total protein intake: unable to determine  Medications: see list Supplementation: chewable multi and calcium and B12 liquid dropper  Drinking while eating: yes Hair loss: unknown, patient has alopecia Carbonated beverages: no N/V/D/C: nausea and constipation Dumping syndrome: none  Recent physical activity:  ADL's  Progress Towards Goal(s):  In progress.  Handouts given during visit include:  none   Nutritional Diagnosis:  NI-2.1  Inadequate intake As related to food intolerance, nausea, and taste aversions.  As evidenced by patient requiring enteral nutrition support to meet nutrient needs.    Intervention:  Nutrition counseling provided.  Dietitian explained to the pt the importance of keeping up care with her surgeon Goals: -For 2 week try only water in your tube and everything else by mouth -try the sweetened soy milk instead of the egg nog in your coffee drink -Do decaff coffee instead of caffeinated  Flush tube with 30 mL of water 4 ounces enlive 6 am  Fiber Drink 4ounces enlive 8 am  Fiber Drink 4 ounces 10 am  Water Flush 4 ounces 12 pm  Water Flush 4 ounces 2pm  Water Flush 4 ounces 4pm  Water Flush 4 ounces 6pm  Water Flush 4 ounces 8pm Teaching Method Utilized:  Visual Auditory Hands on  Barriers to learning/adherence to lifestyle change: nausea and inability to meet nutrition needs orally  Demonstrated degree of understanding via:  Teach Back   Monitoring/Evaluation:  Dietary intake, exercise, and body weight. Follow up in 2 weeks.

## 2017-01-11 NOTE — Patient Instructions (Addendum)
-  For 2 week try only water in your tube and everything else by mouth  -try the sweetened soy milk instead of the egg nog in your coffee drink  -Do decaff coffee instead of caffeinated   Flush tube with 30 mL of water  4 ounces enlive 6 am  Fiber Drink 4ounces enlive 8 am  Fiber Drink 4 ounces 10 am  Water Flush 4 ounces 12 pm  Water Flush 4 ounces 2pm  Water Flush 4 ounces 4pm  Water Flush 4 ounces 6pm  Water Flush 4 ounces 8pm

## 2017-01-12 ENCOUNTER — Ambulatory Visit (INDEPENDENT_AMBULATORY_CARE_PROVIDER_SITE_OTHER): Payer: BLUE CROSS/BLUE SHIELD | Admitting: Psychiatry

## 2017-01-12 DIAGNOSIS — F4323 Adjustment disorder with mixed anxiety and depressed mood: Secondary | ICD-10-CM | POA: Diagnosis not present

## 2017-01-12 DIAGNOSIS — F900 Attention-deficit hyperactivity disorder, predominantly inattentive type: Secondary | ICD-10-CM

## 2017-01-13 MED FILL — XULANE PATCH: 150-35 | 21 days supply | Qty: 3 | Fill #11

## 2017-01-20 ENCOUNTER — Encounter (HOSPITAL_COMMUNITY): Payer: Self-pay | Admitting: *Deleted

## 2017-01-20 ENCOUNTER — Emergency Department (HOSPITAL_COMMUNITY)
Admission: EM | Admit: 2017-01-20 | Discharge: 2017-01-21 | Disposition: A | Discharge status: Home / Self Care | Payer: BLUE CROSS/BLUE SHIELD | Attending: Emergency Medicine | Admitting: Emergency Medicine

## 2017-01-20 ENCOUNTER — Ambulatory Visit: Payer: BLUE CROSS/BLUE SHIELD | Admitting: Emergency Medicine

## 2017-01-20 ENCOUNTER — Other Ambulatory Visit: Payer: Self-pay

## 2017-01-20 ENCOUNTER — Encounter: Payer: Self-pay | Admitting: Emergency Medicine

## 2017-01-20 VITALS — BP 164/104 | HR 85 | Temp 99.1°F | Resp 16 | Ht 66.75 in | Wt 180.4 lb

## 2017-01-20 DIAGNOSIS — Z79899 Other long term (current) drug therapy: Secondary | ICD-10-CM | POA: Diagnosis not present

## 2017-01-20 DIAGNOSIS — R030 Elevated blood-pressure reading, without diagnosis of hypertension: Secondary | ICD-10-CM

## 2017-01-20 DIAGNOSIS — R29898 Other symptoms and signs involving the musculoskeletal system: Secondary | ICD-10-CM | POA: Diagnosis not present

## 2017-01-20 DIAGNOSIS — R51 Headache: Secondary | ICD-10-CM | POA: Diagnosis not present

## 2017-01-20 DIAGNOSIS — R4189 Other symptoms and signs involving cognitive functions and awareness: Secondary | ICD-10-CM

## 2017-01-20 DIAGNOSIS — G43901 Migraine, unspecified, not intractable, with status migrainosus: Secondary | ICD-10-CM | POA: Diagnosis not present

## 2017-01-20 DIAGNOSIS — R519 Headache, unspecified: Secondary | ICD-10-CM

## 2017-01-20 MED ORDER — METOCLOPRAMIDE HCL 5 MG/ML IJ SOLN
10.0000 mg | Freq: Once | INTRAMUSCULAR | Status: AC
  Administered 2017-01-21: 10 mg via INTRAVENOUS
  Filled 2017-01-20: qty 2

## 2017-01-20 MED ORDER — ONDANSETRON HCL 4 MG/2ML IJ SOLN
4.0000 mg | Freq: Once | INTRAMUSCULAR | Status: AC
  Administered 2017-01-20: 4 mg via INTRAVENOUS
  Filled 2017-01-20: qty 2

## 2017-01-20 MED ORDER — KETOROLAC TROMETHAMINE 15 MG/ML IJ SOLN
15.0000 mg | Freq: Once | INTRAMUSCULAR | Status: AC
  Administered 2017-01-20: 15 mg via INTRAVENOUS
  Filled 2017-01-20: qty 1

## 2017-01-20 MED ORDER — KETOROLAC TROMETHAMINE 15 MG/ML IJ SOLN
15.0000 mg | Freq: Once | INTRAMUSCULAR | Status: AC
  Administered 2017-01-21: 15 mg via INTRAVENOUS
  Filled 2017-01-20: qty 1

## 2017-01-20 MED ORDER — METOCLOPRAMIDE HCL 5 MG/ML IJ SOLN
10.0000 mg | Freq: Once | INTRAMUSCULAR | Status: AC
  Administered 2017-01-20: 10 mg via INTRAVENOUS
  Filled 2017-01-20: qty 2

## 2017-01-20 MED ORDER — SODIUM CHLORIDE 0.9 % IV BOLUS (SEPSIS)
500.0000 mL | Freq: Once | INTRAVENOUS | Status: AC
  Administered 2017-01-20: 500 mL via INTRAVENOUS

## 2017-01-20 MED ORDER — DEXAMETHASONE SODIUM PHOSPHATE 10 MG/ML IJ SOLN
10.0000 mg | Freq: Once | INTRAMUSCULAR | Status: AC
  Administered 2017-01-21: 10 mg via INTRAVENOUS
  Filled 2017-01-20: qty 1

## 2017-01-20 MED ORDER — DIPHENHYDRAMINE HCL 50 MG/ML IJ SOLN
25.0000 mg | Freq: Once | INTRAMUSCULAR | Status: AC
  Administered 2017-01-20: 25 mg via INTRAVENOUS
  Filled 2017-01-20: qty 1

## 2017-01-20 NOTE — Patient Instructions (Addendum)
To ED for further evaluation and work up.    IF you received an x-ray today, you will receive an invoice from [facility]. Please contact [facility] at [phone] with questions or concerns regarding your invoice.   IF you received labwork today, you will receive an invoice from [facility]. Please contact [facility] at [phone] with questions or concerns regarding your invoice.   Our billing staff will not be able to assist you with questions regarding bills from these companies.  You will be contacted with the lab results as soon as they are available. The fastest way to get your results is to activate your My Chart account. Instructions are located on the last page of this paperwork. If you have not heard from [name] regarding the results in 2 weeks, please contact this office.

## 2017-01-20 NOTE — Discharge Instructions (Signed)
It appears that the headache you are having is complex migraine. They are happy that you are feeling better. It is prudent that you follow-up with your neurologist for optimal care of your headache.  Please return to the ER if the headache gets severe and in not improving, you have associated new one sided numbness, tingling, weakness or confusion, seizures, poor balance or poor vision.

## 2017-01-20 NOTE — ED Triage Notes (Signed)
Pt complains of headache for the past few days. Pt states she tried migraine medication w/o relief.

## 2017-01-20 NOTE — ED Provider Notes (Signed)
[facility] DEPT Provider Note   CSN: [id number] Arrival date & time: 01/20/17  1717     History   Chief Complaint Chief Complaint  Patient presents with  . Headache    HPI KYMBER KOSAR is a 46 y.o. female.  HPI Patient with history of  IIH, migraines, comes in with chief complaint of headaches. Patient reports that the current headache started 3 days ago.  The headache is located on the right side, and is throbbing in nature.  Patient has no associated nausea, however she has some weakness and heaviness in the upper extremities and her legs.  Patient denies any vision change.  Headache is worse with light, sound but it is not necessarily worse when laying supine, or worse at nighttime compared to day.  Patient has had similar headaches in the past with her migraines.  Review of system is also positive for ringing in both of her ears, which has occurred with her IIH in the past.  Patient has taken her medications without any significant pain relief.  Pain is 7 out of 10 at it's worse.  Past Medical History:  Diagnosis Date  . Alopecia   . Anxiety   . Autoimmune disease ([place])    "Alopecia"  . Bacterial vaginosis   . Cyst of spleen   . Depression   . Frequent UTI   . Hypertrophy of breast   . IIH (idiopathic intracranial hypertension) 2017   "related to headaches"  . Migraine headache    denies, ruled out - Propranolol.Using for headaches    Patient Active Problem List   Diagnosis Date Noted  . Dehydration 11/30/2016  . Memory loss due to medical condition 10/13/2016  . G tube feedings ([place]) 10/13/2016  . Adjustment disorder with mixed anxiety and depressed mood 10/13/2016  . Debility 10/13/2016  . Hypokalemia 02/22/2016  . Constipation, chronic 02/22/2016  . Chronic nausea 02/22/2016  . Epigastric pain 02/19/2016  . Protein-calorie malnutrition, moderate ([place]) 08/19/2015  . Mild obstructive sleep apnea 07/08/2015  . Dyslipidemia  07/08/2015  . S/P gastric bypass 07/08/2015  . IIH (idiopathic intracranial hypertension) 06/03/2015  . Chronic migraine without aura or status migrainosus 04/30/2015  . Worsening headaches 04/30/2015  . Vision changes 04/30/2015  . Perceived hearing changes 04/30/2015  . Liver lesion 03/21/2013  . Splenic cyst 03/21/2013  . Abdominal pain 03/21/2013    Past Surgical History:  Procedure Laterality Date  . APPENDECTOMY    . CESAREAN SECTION    . CESAREAN SECTION    . CHOLECYSTECTOMY    . ENDOMETRIAL ABLATION    . IR GENERIC HISTORICAL  10/04/2015   IR [place] TUBE PERCUT W/FLUORO 10/04/2015 Markus Daft, MD [facility]  . IR GENERIC HISTORICAL  01/31/2016   IR [place] GASTRO/COLONIC TUBE PERCUT W/FLUORO 01/31/2016 Corrie Mckusick, DO [facility]  . IR GENERIC HISTORICAL  04/06/2016   IR [place] TUBE PERCUT W/FLUORO 04/06/2016 Arne Cleveland, MD [facility]  . IR GENERIC HISTORICAL  05/14/2016   IR GASTRIC TUBE PERC CHG W/O IMG GUIDE 05/14/2016 Sandi Mariscal, MD [facility]  . TUBAL LIGATION    . WISDOM TOOTH EXTRACTION      OB History    No data available       Home Medications    Prior to Admission medications   Medication Sig Start Date End Date Taking? Authorizing Provider  amitriptyline (ELAVIL) 10 MG tablet Take 2 tablets (20 mg total) by mouth at bedtime. 09/14/16  Yes Melvenia Beam, MD  amphetamine-dextroamphetamine (ADDERALL) 10 MG tablet Take 1 tablet (10 mg total) by mouth 2 (two) times daily. 02/02/17  Yes Shawnee Knapp, MD  CALCIUM-VITAMIN D PO Take by mouth 3 (three) times daily. chewable   Yes [provider]  Cyanocobalamin (VITAMIN B-12) 1000 MCG/15ML LIQD Take 5 mLs by mouth daily.   Yes [provider]  docusate sodium (COLACE) 100 MG capsule Take 100 mg by mouth daily.   Yes [provider]  Multiple Vitamin (MULTIVITAMIN) LIQD Take by mouth 2 (two) times daily. chewable   Yes [provider]    Nutritional Supplements (FEEDING SUPPLEMENT, VITAL HIGH PROTEIN,) LIQD liquid Place 1,000 mLs into feeding tube daily. 34ml/hr for 8hrs a day as needed for poor oral intake; 300 ml free water q4hr Patient taking differently: Place 1,000 mLs into feeding tube daily. 62ml/hr for 8hrs a day as needed for poor oral intake; 300 ml free water q4hr 02/24/16  Yes Greer Pickerel, MD  oxyCODONE (OXY IR/ROXICODONE) 5 MG immediate release tablet Take 1-2 tablets (5-10 mg total) by mouth every 4 (four) hours as needed for moderate pain. 01/07/17  Yes Shawnee Knapp, MD  topiramate (TOPAMAX) 50 MG tablet Take 1 tablet (50 mg total) by mouth daily. Patient taking differently: Take 150 mg by mouth daily.  05/25/16  Yes Melvenia Beam, MD  [name] 150-35 MCG/24HR transdermal patch Place 1 patch onto the skin once a week.  02/11/16  Yes [provider]  clonazePAM (KLONOPIN) 0.5 MG tablet Take 1 tablet (0.5 mg total) by mouth 3 (three) times daily as needed for anxiety. 01/07/17   Shawnee Knapp, MD  eletriptan (RELPAX) 40 MG tablet Take 1 tablet (40 mg total) by mouth every 2 (two) hours as needed for migraine or headache. Do not use >2 doses/24 hours 01/07/17   Shawnee Knapp, MD  hydrocortisone (ANUSOL-HC) 25 MG suppository Place 1 suppository (25 mg total) rectally 2 (two) times daily as needed for hemorrhoids. - after a bowel movement if possible 01/07/17   Shawnee Knapp, MD  ipratropium (ATROVENT) 0.03 % nasal spray Place 2 sprays into the nose 4 (four) times daily. Patient not taking: Reported on 01/20/2017 05/25/16   Shawnee Knapp, MD  lactulose [facility]) 10 GM/15ML solution Take 30 g by mouth daily as needed for moderate constipation.  01/14/16   [provider]  linaclotide Rolan Lipa) 145 MCG CAPS capsule Take 1 capsule (145 mcg total) by mouth daily before breakfast. As needed for chronic constipation 10/26/16   Shawnee Knapp, MD  metoCLOPramide (REGLAN) 5 MG tablet Take 1 tablet (5 mg total) by mouth 4 (four)  times daily -  before meals and at bedtime. 01/07/17   Shawnee Knapp, MD  ondansetron (ZOFRAN) 4 MG tablet Take 1 tablet (4 mg total) by mouth every 6 (six) hours. Patient taking differently: Take 4 mg every 8 (eight) hours as needed by mouth for nausea or vomiting.  12/27/16   Varney Biles, MD  ondansetron (ZOFRAN-ODT) 4 MG disintegrating tablet Take 1 tablet (4 mg total) by mouth every 6 (six) hours as needed for nausea. Patient not taking: Reported on 01/20/2017 02/24/16   Greer Pickerel, MD  oxyCODONE-acetaminophen (PERCOCET/ROXICET) 5-325 MG tablet Take 1 tablet every 6 (six) hours as needed by mouth for moderate pain or severe pain.  12/28/16   [provider]  polyethylene glycol (MIRALAX / GLYCOLAX) packet Take 17 g by mouth daily.  [provider]  simethicone (MYLICON) 80 MG chewable tablet Chew 80 mg by mouth every 6 (six) hours as needed for flatulence.    [provider]    Family History Family History  Problem Relation Age of Onset  . Cancer Mother   . Stroke Father   . Diabetes Brother   . Seizures Son   . Migraines Neg Hx     Social History Social History   Tobacco Use  . Smoking status: Never Smoker  . Smokeless tobacco: Never Used  Substance Use Topics  . Alcohol use: Yes    Comment: socially  . Drug use: No     Allergies   Sulfa antibiotics and Zosyn [piperacillin sod-tazobactam so]   Review of Systems Review of Systems  Constitutional: Positive for activity change.  Eyes: Positive for photophobia. Negative for pain and visual disturbance.  Respiratory: Negative for shortness of breath.   Cardiovascular: Negative for chest pain.  Gastrointestinal: Negative for nausea and vomiting.  Musculoskeletal: Negative for back pain.  Allergic/Immunologic: Negative for immunocompromised state.  Neurological: Positive for weakness, numbness and headaches. Negative for syncope and facial asymmetry.  Hematological: Does not bruise/bleed  easily.  All other systems reviewed and are negative.    Physical Exam Updated Vital Signs BP (!) 134/96 (BP Location: Left Arm)   Pulse (!) 55   Temp 98 F (36.7 C) (Oral)   Resp 14   SpO2 100%   Physical Exam  Constitutional: She is oriented to person, place, and time. She appears well-developed.  HENT:  Head: Normocephalic and atraumatic.  Eyes: EOM are normal. Pupils are equal, round, and reactive to light. Right eye exhibits normal extraocular motion and no nystagmus. Left eye exhibits normal extraocular motion and no nystagmus.  No fundoscopic retinal hemorrhage  Neck: Normal range of motion. Neck supple. No neck rigidity.  Cardiovascular: Normal rate.  Pulmonary/Chest: Effort normal.  Abdominal: Bowel sounds are normal.  Neurological: She is alert and oriented to person, place, and time. She has normal strength. She is not disoriented. No cranial nerve deficit or sensory deficit. GCS eye subscore is 4. GCS verbal subscore is 5. GCS motor subscore is 6.  Skin: Skin is warm and dry.  Nursing note and vitals reviewed.    ED Treatments / Results  Labs (all labs ordered are listed, but only abnormal results are displayed) Labs Reviewed - No data to display  EKG  EKG Interpretation None       Radiology No results found.  Procedures Procedures (including critical care time)  Medications Ordered in ED Medications  metoCLOPramide (REGLAN) injection 10 mg (not administered)  ketorolac (TORADOL) 15 MG/ML injection 15 mg (not administered)  dexamethasone (DECADRON) injection 10 mg (not administered)  ketorolac (TORADOL) 15 MG/ML injection 15 mg (15 mg Intravenous Given 01/20/17 2121)  metoCLOPramide (REGLAN) injection 10 mg (10 mg Intravenous Given 01/20/17 2122)  diphenhydrAMINE (BENADRYL) injection 25 mg (25 mg Intravenous Given 01/20/17 2121)  sodium chloride 0.9 % bolus 500 mL (0 mLs Intravenous Stopped 01/20/17 2300)  ondansetron (ZOFRAN) injection 4 mg (4 mg  Intravenous Given 01/20/17 2146)     Initial Impression / Assessment and Plan / ED Course  I have reviewed the triage vital signs and the nursing notes.  Pertinent labs & imaging results that were available during my care of the patient were reviewed by me and considered in my medical decision making (see chart for details).  Clinical Course as of Jan 21 2355  Wed Jan 20, 2017  2250 Upon reassessment, patient reports that the headache has improved to 3/10. She continued to have no new neurologic complains. Strict return precautions discussed, pt will return to the ER if there is visual complains, seizures, altered mental status, loss of consciousness, dizziness, new focal weakness, or numbness.     [AN]    Clinical Course User Index [AN] Varney Biles, MD    Patient with history of migraines, IIH comes in with chief complaint of headaches.  Patient has no focal neurologic complaints, but feels generalized body weakness/heaviness.  Patient also does not have any nausea, vomiting, confusion, neck pain or stiffness and the headaches are not positional.  Patient's neuro exam is nonfocal.  DDX includes: Primary headaches - including migrainous headaches, cluster headaches, tension headaches. ICH Venous dural thrombosis Cavernous sinus thrombosis Vascular headaches AV malformation Brain aneurysm Muscular headaches  A/P: The headaches is suspected to be most likely complex migraine, given that patient has photophobia, phonophobia, throbbing unilateral pain that has been going on for 3 days. Patient is not on any hormones that would increase her chance of thrombosis, and in general she is not hypercoagulable -thus venous dural thrombosis considered to be less likely.  Finally patient also has history of IIH, and reports some tingling in her ears that was present with her previous IIH pain, however there is no associated vision changes, nausea and the headache is not worse when laying flat  or at nighttime which makes [name] believe that this most likely is not an elevated intracranial pressure type headache.   Plan is to start with headache cocktail.  Patient will be reassessed, and if her pain is not improved or getting worse or if there is any new neurologic symptoms we will pursue CT scan of the head and maybe a venogram.   Final Clinical Impressions(s) / ED Diagnoses   Final diagnoses:  Status migrainosus    ED Discharge Orders    None       Varney Biles, MD 01/20/17 2357

## 2017-01-20 NOTE — Progress Notes (Signed)
BP Readings from Last 3 Encounters:  01/20/17 (!) 164/104  01/07/17 116/78  12/27/16 105/69

## 2017-01-20 NOTE — Progress Notes (Signed)
Margaret Fletcher 46 y.o.   Chief Complaint  Patient presents with  . Generalized Body Aches    per patient hard to explain - HANDS,LEGS,RIGHT ARM (numbness) x 2 days    HISTORY OF PRESENT ILLNESS: This is a 46 y.o. female complaining of feeling sluggish, headache, muscle aches, body feels heavy,hard to think and concentrate; right arm feels heavy; feels "disconnected"; can't really put into words what she feels but it's "not me". It all started 2 days ago and slowly progressing.  HPI   Prior to Admission medications   Medication Sig Start Date End Date Taking? Authorizing Provider  amitriptyline (ELAVIL) 10 MG tablet Take 2 tablets (20 mg total) by mouth at bedtime. 09/14/16  Yes Melvenia Beam, MD  amphetamine-dextroamphetamine (ADDERALL) 10 MG tablet Take 1 tablet (10 mg total) by mouth 2 (two) times daily. 02/02/17  Yes Shawnee Knapp, MD  CALCIUM-VITAMIN D PO Take by mouth 3 (three) times daily. chewable   Yes [provider]  clonazePAM (KLONOPIN) 0.5 MG tablet Take 1 tablet (0.5 mg total) by mouth 3 (three) times daily as needed for anxiety. 01/07/17  Yes Shawnee Knapp, MD  Cyanocobalamin (VITAMIN B-12) 1000 MCG/15ML LIQD Take 5 mLs by mouth daily.   Yes [provider]  eletriptan (RELPAX) 40 MG tablet Take 1 tablet (40 mg total) by mouth every 2 (two) hours as needed for migraine or headache. Do not use >2 doses/24 hours 01/07/17  Yes Shawnee Knapp, MD  hydrocortisone (ANUSOL-HC) 25 MG suppository Place 1 suppository (25 mg total) rectally 2 (two) times daily as needed for hemorrhoids. - after a bowel movement if possible 01/07/17  Yes Shawnee Knapp, MD  lactulose [facility]) 10 GM/15ML solution Take 30 g by mouth daily as needed for moderate constipation.  01/14/16  Yes [provider]  linaclotide Rolan Lipa) 145 MCG CAPS capsule Take 1 capsule (145 mcg total) by mouth daily before breakfast. As needed for chronic constipation 10/26/16  Yes Shawnee Knapp, MD   metoCLOPramide (REGLAN) 5 MG tablet Take 1 tablet (5 mg total) by mouth 4 (four) times daily -  before meals and at bedtime. 01/07/17  Yes Shawnee Knapp, MD  Multiple Vitamin (MULTIVITAMIN) LIQD Take by mouth 2 (two) times daily. chewable   Yes [provider]  Nutritional Supplements (FEEDING SUPPLEMENT, VITAL HIGH PROTEIN,) LIQD liquid Place 1,000 mLs into feeding tube daily. 35ml/hr for 8hrs a day as needed for poor oral intake; 300 ml free water q4hr Patient taking differently: Place 1,000 mLs into feeding tube daily. 14ml/hr for 8hrs a day as needed for poor oral intake; 300 ml free water q4hr 02/24/16  Yes Greer Pickerel, MD  ondansetron (ZOFRAN-ODT) 4 MG disintegrating tablet Take 1 tablet (4 mg total) by mouth every 6 (six) hours as needed for nausea. 02/24/16  Yes Greer Pickerel, MD  oxyCODONE (OXY IR/ROXICODONE) 5 MG immediate release tablet Take 1-2 tablets (5-10 mg total) by mouth every 4 (four) hours as needed for moderate pain. 01/07/17  Yes Shawnee Knapp, MD  polyethylene glycol [facility] Floria Raveling) packet Take 17 g by mouth daily.   Yes [provider]  simethicone (MYLICON) 80 MG chewable tablet Chew 80 mg by mouth every 6 (six) hours as needed for flatulence.   Yes [provider]  topiramate (TOPAMAX) 50 MG tablet Take 1 tablet (50 mg total) by mouth daily. Patient taking differently: Take 150 mg by mouth daily.  05/25/16  Yes Sarina Ill  B, MD  [name] 150-35 MCG/24HR transdermal patch Place 1 patch onto the skin once a week.  02/11/16  Yes [provider]  docusate sodium (COLACE) 100 MG capsule Take 100 mg by mouth daily.    [provider]  ipratropium (ATROVENT) 0.03 % nasal spray Place 2 sprays into the nose 4 (four) times daily. Patient not taking: Reported on 01/20/2017 05/25/16   Shawnee Knapp, MD  ondansetron (ZOFRAN) 4 MG tablet Take 1 tablet (4 mg total) by mouth every 6 (six) hours. Patient not taking: Reported on 01/20/2017 12/27/16    Varney Biles, MD    Allergies  Allergen Reactions  . Sulfa Antibiotics Anaphylaxis  . Zosyn [Piperacillin Sod-Tazobactam So] Rash    Patient Active Problem List   Diagnosis Date Noted  . Dehydration 11/30/2016  . Memory loss due to medical condition 10/13/2016  . G tube feedings ([place]) 10/13/2016  . Adjustment disorder with mixed anxiety and depressed mood 10/13/2016  . Debility 10/13/2016  . Hypokalemia 02/22/2016  . Constipation, chronic 02/22/2016  . Chronic nausea 02/22/2016  . Epigastric pain 02/19/2016  . Protein-calorie malnutrition, moderate ([place]) 08/19/2015  . Mild obstructive sleep apnea 07/08/2015  . Dyslipidemia 07/08/2015  . S/P gastric bypass 07/08/2015  . IIH (idiopathic intracranial hypertension) 06/03/2015  . Chronic migraine without aura or status migrainosus 04/30/2015  . Worsening headaches 04/30/2015  . Vision changes 04/30/2015  . Perceived hearing changes 04/30/2015  . Liver lesion 03/21/2013  . Splenic cyst 03/21/2013  . Abdominal pain 03/21/2013    Past Medical History:  Diagnosis Date  . Alopecia   . Anxiety   . Autoimmune disease ([place])    "Alopecia"  . Bacterial vaginosis   . Cyst of spleen   . Depression   . Frequent UTI   . Hypertrophy of breast   . IIH (idiopathic intracranial hypertension) 2017   "related to headaches"  . Migraine headache    denies, ruled out - Propranolol.Using for headaches    Past Surgical History:  Procedure Laterality Date  . APPENDECTOMY    . CESAREAN SECTION    . CESAREAN SECTION    . CHOLECYSTECTOMY    . ENDOMETRIAL ABLATION    . IR GENERIC HISTORICAL  10/04/2015   IR [place] TUBE PERCUT W/FLUORO 10/04/2015 Markus Daft, MD [facility]  . IR GENERIC HISTORICAL  01/31/2016   IR [place] GASTRO/COLONIC TUBE PERCUT W/FLUORO 01/31/2016 Corrie Mckusick, DO [facility]  . IR GENERIC HISTORICAL  04/06/2016   IR [place] TUBE PERCUT W/FLUORO 04/06/2016 Arne Cleveland, MD [facility]  .  IR GENERIC HISTORICAL  05/14/2016   [place] GASTRIC TUBE PERC CHG W/O IMG GUIDE 05/14/2016 Sandi Mariscal, MD [facility]  . TUBAL LIGATION    . WISDOM TOOTH EXTRACTION      Social History   Socioeconomic History  . Marital status: Divorced    Spouse name: Not on file  . Number of children: 2  . Years of education: 43  . Highest education level: Not on file  Social Needs  . Financial resource strain: Not on file  . Food insecurity - worry: Not on file  . Food insecurity - inability: Not on file  . Transportation needs - medical: Not on file  . Transportation needs - non-medical: Not on file  Occupational History  . Occupation: - nurse tech  Tobacco Use  . Smoking status: Never Smoker  . Smokeless tobacco: Never Used  Substance and Sexual Activity  . Alcohol use:  Yes    Comment: socially  . Drug use: No  . Sexual activity: Yes    Birth control/protection: Surgical  Other Topics Concern  . Not on file  Social History Narrative   Lives with partner   Caffeine use: minimal coffee   Right-handed    Family History  Problem Relation Age of Onset  . Cancer Mother   . Stroke Father   . Diabetes Brother   . Seizures Son   . Migraines Neg Hx      Review of Systems  Constitutional: Positive for malaise/fatigue. Negative for chills and fever.  HENT: Negative for congestion, ear pain, nosebleeds and sore throat.   Eyes: Negative for blurred vision and double vision.  Respiratory: Negative for cough, hemoptysis and shortness of breath.   Cardiovascular: Positive for leg swelling. Negative for chest pain and palpitations.  Gastrointestinal: Positive for nausea. Negative for abdominal pain, diarrhea and vomiting.  Genitourinary: Negative for dysuria and hematuria.  Musculoskeletal: Positive for back pain, joint pain and myalgias.  Skin: Negative for rash.  Neurological: Positive for dizziness, sensory change, focal weakness, weakness and headaches.  Endo/Heme/Allergies:  Negative.   All other systems reviewed and are negative.  Vitals:   01/20/17 1617  BP: (!) 164/104  Pulse: 85  Resp: 16  Temp: 99.1 F (37.3 C)  SpO2: 99%     Physical Exam  Constitutional: She is oriented to person, place, and time. She appears well-developed and well-nourished.  HENT:  Head: Normocephalic and atraumatic.  Mouth/Throat: Oropharynx is clear and moist.  Eyes: Conjunctivae and EOM are normal. Pupils are equal, round, and reactive to light.  Neck: Normal range of motion. Neck supple. No JVD present.  Cardiovascular: Normal rate, regular rhythm, normal heart sounds and intact distal pulses.  Pulmonary/Chest: Effort normal and breath sounds normal.  Abdominal: Soft.  Feeding tube  Lymphadenopathy:    She has no cervical adenopathy.  Neurological: She is alert and oriented to person, place, and time. No cranial nerve deficit or sensory deficit. She exhibits normal muscle tone.  Mild weakness to right arm.  Skin: Skin is warm and dry. Capillary refill takes less than 2 seconds.  Psychiatric: Her behavior is normal.  teary     ASSESSMENT & PLAN: Needs further evaluation in the ED to r/o neurological event.  Avalie was seen today for generalized body aches.  Diagnoses and all orders for this visit:  Right arm weakness  Acute nonintractable headache, unspecified headache type  Cognitive changes  Elevated blood-pressure reading without diagnosis of hypertension      Agustina Caroli, MD [facility]

## 2017-01-25 ENCOUNTER — Encounter: Payer: BLUE CROSS/BLUE SHIELD | Admitting: Skilled Nursing Facility1

## 2017-01-25 DIAGNOSIS — Z713 Dietary counseling and surveillance: Secondary | ICD-10-CM | POA: Diagnosis not present

## 2017-01-25 DIAGNOSIS — E440 Moderate protein-calorie malnutrition: Secondary | ICD-10-CM

## 2017-01-25 NOTE — Progress Notes (Signed)
RYGB Surgery  Medical Nutrition Therapy:  Appt start time: 400 end time: 435  Primary concerns today: Post-operative Bariatric Surgery Nutrition Management.  Margaret Fletcher returns for a bariatric nutrition follow up post op RYGB.   Pt states she is still trying to get into a [facility] hospital.  Grilled vegetables and and grilled chicken resulting in pains in stomach and distended abdomen the next morning feeling overly full then 2-3 days of nothing: sips here and there with 6 ounces of orange juice with miralax.  Wakes up 6am in bed at 10pm so waking hours from 6am-9pm.   1 20 ounce cup of orange juice every other day or every 3 days with miralax  1 20 ounces of soy eggnog milk (probably 2-4 ounces), 16 ounces coffee, 11 ounces premier protein (taking her pills with this) (drinking until 12) At 8 pm starting to feel hungry again: may try to eat something homade  appelsauce or chips or hamburger patty or 1 slice of bread with peanut butter  2 water flushes for the entire day 120 ml (4 ounces) 8 ounces of juice every day  40 + 160 + 100: estimated caloric intake 400-500   Pt states she went to the emergency room due to swollen hands, swollen legs, migraine, high blood pressure, hand was not working right, was under a Lot of stress from traveling. Pt arrives not having fully attempted the strategies at the last appointment but she is doing soy eggnog and decaff coffee.   Surgery date: 07/08/2015 Surgery type: RYGB Start weight at [facility]: 278 lbs on 05/24/2014 Weight today: 181 lbs Weight change: 10lbs gained  TANITA  BODY COMP RESULTS  07/01/15 07/23/15 10/31/15 02/07/16 03/16/16 04/20/16 06/22/2016 08/24/2016 11/23/2016 01/11/2017 01/25/2017   BMI (kg/m^2) 40.4 38.6 33.4 30.4 29 28.3 28.5 29.3 25.8 27.6 27.1   Fat Mass (lbs) 138.5 131.0 103.2 84 80 74 69.6 67.8 54.4 58.8 65.6   Fat Free Mass (lbs) 127.5 122.8 116.4 116.2 110.6 112.2 118 119.4 115.6 122.4 112.4   Total Body Water (lbs) 93.5 90.4 84.4  83.4 79.2 80.2 84.2 85.2 82.4 87.2 80    Preferred Learning Style:   No preference indicated   Learning Readiness:   Ready  Fluid intake: 24 oz water and tea + 8oz can enteral formula  Estimated total protein intake: unable to determine  Medications: see list Supplementation: chewable multi and calcium and B12 liquid dropper  Drinking while eating: yes Hair loss: unknown, patient has alopecia Carbonated beverages: no N/V/D/C: nausea and constipation Dumping syndrome: none  Recent physical activity:  ADL's  Progress Towards Goal(s):  In progress.  Handouts given during visit include:  none   Nutritional Diagnosis:  NI-2.1 Inadequate intake As related to food intolerance, nausea, and taste aversions.  As evidenced by patient requiring enteral nutrition support to meet nutrient needs.    Intervention:  Nutrition counseling provided.  Dietitian explained to the pt the importance of keeping up care with her surgeon Goals: -Chike coffee protein  -Do the smaller cup of coffee and fill the rest of your cup with the eggnog  -Try the 60 ml of ensure at 3pm: standing up and walking around Teaching Method Utilized:  Visual Auditory Hands on  Barriers to learning/adherence to lifestyle change: nausea and inability to meet nutrition needs orally  Demonstrated degree of understanding via:  Teach Back   Monitoring/Evaluation:  Dietary intake, exercise, and body weight. Follow up in 2 weeks.

## 2017-01-25 NOTE — Patient Instructions (Addendum)
-  Chike coffee protein   -Do the smaller cup of coffee and fill the rest of your cup with the eggnog   -Try the 60 ccs of ensure at 3pm: standing up and walking around

## 2017-01-26 ENCOUNTER — Telehealth: Payer: Self-pay | Admitting: Neurology

## 2017-01-26 NOTE — Telephone Encounter (Signed)
Margaret Fletcher, would you call patient and discuss the letter she sent to [name]. I'm so sorry she is still having headaches!  As far as blood pressure, I think she needs to see her pcp to manage blood pressure She cannot take prednisone daily, there are lots of risks using long term steroids Does patient have insurance? If not, lets try and help her get [facility] Financial Assistance so we can see her in the office, maybe Hinton Dyer can help

## 2017-01-27 NOTE — Telephone Encounter (Signed)
Called and spoke with patient. She reported that her migraine is now just a headache. She is no longer taking the steroids; she was given a one time prescription when she was at the hospital. She states she has seen a nutritionist and will be making changes with her diet to see if that helps the headaches because she has had poor intake. She also will continue to follow up with her PCP, Dr. Brigitte Pulse, for blood pressure management and other non-neurological related issues. She verbalized understanding of her upcoming appointment on 02/15/17 with Dr. Jaynee Eagles and will call us in the interim with any concerns.   Pt has [employer], card scanned into system in September 2018.

## 2017-02-03 MED FILL — clonazePAM 0.5 MG TABS: 0.5 | 30 days supply | Qty: 90 | Fill #0

## 2017-02-03 MED FILL — oxyCODONE HCL 5 MG TABS: 5 | 5 days supply | Qty: 60 | Fill #0

## 2017-02-04 ENCOUNTER — Ambulatory Visit (INDEPENDENT_AMBULATORY_CARE_PROVIDER_SITE_OTHER): Payer: BLUE CROSS/BLUE SHIELD | Admitting: Psychiatry

## 2017-02-04 ENCOUNTER — Telehealth: Payer: Self-pay | Admitting: Family Medicine

## 2017-02-04 DIAGNOSIS — F4323 Adjustment disorder with mixed anxiety and depressed mood: Secondary | ICD-10-CM | POA: Diagnosis not present

## 2017-02-04 MED FILL — XULANE PATCH: 150-35 | 21 days supply | Qty: 3 | Fill #0

## 2017-02-04 NOTE — Telephone Encounter (Signed)
Spoke with pharmacist.  Rx is one written 12/04/2016 and can fill 02/02/2017 per Epic chart

## 2017-02-04 NOTE — Telephone Encounter (Signed)
Olinda calling stating that pt was currently at the pharmacy to pick up the refill of Adderall.Adderall prescription received, and pharmacy stating that prescription could be filled but a verbal order was needed in order to do that because according to their records it was too early for a refill. Prescription states that Adderall could be filled 60days from date printed and the pharmacy just wanted to receive clarity on when med could be filled. Flow coordinator contacted regarding this matter, but Dr. Brigitte Pulse was not in the office at the time to give verbal order.

## 2017-02-08 MED FILL — DEXTROAMP-AMP 10 MG TAB: 10 | 30 days supply | Qty: 60 | Fill #0

## 2017-02-12 ENCOUNTER — Other Ambulatory Visit: Payer: Self-pay

## 2017-02-12 ENCOUNTER — Encounter: Payer: Self-pay | Admitting: Family Medicine

## 2017-02-12 ENCOUNTER — Telehealth: Payer: Self-pay | Admitting: Family Medicine

## 2017-02-12 ENCOUNTER — Ambulatory Visit: Payer: BLUE CROSS/BLUE SHIELD | Admitting: Family Medicine

## 2017-02-12 VITALS — BP 120/74 | HR 94 | Temp 98.7°F | Resp 16 | Ht 68.0 in | Wt 183.2 lb

## 2017-02-12 DIAGNOSIS — Z9884 Bariatric surgery status: Secondary | ICD-10-CM | POA: Diagnosis not present

## 2017-02-12 DIAGNOSIS — R252 Cramp and spasm: Secondary | ICD-10-CM

## 2017-02-12 DIAGNOSIS — E440 Moderate protein-calorie malnutrition: Secondary | ICD-10-CM

## 2017-02-12 DIAGNOSIS — F329 Major depressive disorder, single episode, unspecified: Secondary | ICD-10-CM | POA: Diagnosis not present

## 2017-02-12 DIAGNOSIS — M6289 Other specified disorders of muscle: Secondary | ICD-10-CM | POA: Diagnosis not present

## 2017-02-12 DIAGNOSIS — L03115 Cellulitis of right lower limb: Secondary | ICD-10-CM | POA: Diagnosis not present

## 2017-02-12 MED ORDER — MUPIROCIN 2 % EX OINT: 1.0000 "application " | TOPICAL_OINTMENT | Freq: Three times a day (TID) | CUTANEOUS | 1 refills | Status: DC

## 2017-02-12 MED ORDER — MUPIROCIN 2 % EX OINT
1.0000 | TOPICAL_OINTMENT | Freq: Three times a day (TID) | CUTANEOUS | 1 refills | Status: DC
Start: 2017-02-12 — End: 2017-05-01

## 2017-02-12 MED ORDER — CEPHALEXIN 500 MG PO CAPS: 500.0000 mg | ORAL_CAPSULE | Freq: Four times a day (QID) | ORAL | 0 refills | Status: DC

## 2017-02-12 MED ORDER — DESVENLAFAXINE SUCCINATE ER 25 MG PO TB24: 25.0000 mg | ORAL_TABLET | Freq: Every day | ORAL | 0 refills | Status: DC

## 2017-02-12 MED ORDER — FLUCONAZOLE 150 MG PO TABS
150.0000 mg | ORAL_TABLET | Freq: Once | ORAL | 0 refills | Status: AC
Start: 2017-02-12 — End: 2017-02-12

## 2017-02-12 MED ORDER — FLUCONAZOLE 150 MG PO TABS: 150.0000 mg | ORAL_TABLET | Freq: Once | ORAL | 0 refills | Status: DC

## 2017-02-12 MED FILL — MUPIROCIN 2% OINTMENT: 2 | 10 days supply | Qty: 22 | Fill #0

## 2017-02-12 MED FILL — CEPHALEXIN 500 MG CAPSULE: 500 | 7 days supply | Qty: 28 | Fill #0

## 2017-02-12 MED FILL — DESVENLAFAXINE SUC ER 25 MG: 25 | 30 days supply | Qty: 30 | Fill #0

## 2017-02-12 MED FILL — FLUCONAZOLE 150 MG TABLET: 150 | 3 days supply | Qty: 2 | Fill #0

## 2017-02-12 NOTE — Progress Notes (Signed)
Subjective:    Patient ID: Margaret Fletcher, female    DOB: 1970-03-14, 46 y.o.   MRN: [id number] Chief Complaint  Patient presents with  . Advice Only    to discuss anti depressent medications     HPI 2 of the elavil at night before bed. Having HAs every day but has appt with Dr. Jaynee Eagles mon  Legs have been swollen since her hospitalization.  Legs, hands, and feet will be numb and feel heavy. She has had some falls. She notices that her weight is fluctuating.  She was just walking down the hallway and everything felt really heavy and she just couldn't take another step and she fell. Had a similar sxs when her arm was really heavy on 11/14 as well.  She is dropping things - more with her right hand.  + lower extremity muscle cramps - but only from knees down.   Is hoarse for several days, no recent URI. No congestion.   Hands and toes are purple.   Is taking vitamins and chewable calcium. Does feel cold all over.    Questions of wellbutrin causing headaches.Failed mirtazapine 20mg .  Sertraline - zoloft for anxiety but it seemws to poss be making her anxiety worse but very difficult to tell - some days are better than others.  In the first several weeks she was more jittery and then those sxs went away.  Had not tried any SNRIs. She still has pain when trying to eat.   Past Medical History:  Diagnosis Date  . Alopecia   . Anxiety   . Autoimmune disease ([place])    "Alopecia"  . Bacterial vaginosis   . Cyst of spleen   . Depression   . Frequent UTI   . Hypertrophy of breast   . IIH (idiopathic intracranial hypertension) 2017   "related to headaches"  . Migraine headache    denies, ruled out - Propranolol.Using for headaches   Past Surgical History:  Procedure Laterality Date  . APPENDECTOMY    . BREAST REDUCTION SURGERY Bilateral 03/26/2015   Procedure: BILATERAL BREAST REDUCTION  ;  Surgeon: Youlanda Roys, MD;  Location: [place];  Service: Plastics;   Laterality: Bilateral;  . BREATH TEK H PYLORI N/A 05/21/2014   Procedure: BREATH TEK H PYLORI;  Surgeon: Greer Pickerel, MD;  Location: [facility];  Service: General;  Laterality: N/A;  . CESAREAN SECTION    . CESAREAN SECTION    . CHOLECYSTECTOMY    . DILITATION & CURRETTAGE/HYSTROSCOPY WITH NOVASURE ABLATION N/A 07/19/2012   Procedure: HYSTEROSCOPY WITH NOVASURE ABLATION;  Surgeon: Farrel Gobble. Harrington Challenger, MD;  Location: [facility];  Service: Gynecology;  Laterality: N/A;  . ENDOMETRIAL ABLATION    . ESOPHAGOGASTRODUODENOSCOPY N/A 08/16/2015   Procedure: UPPER ESOPHAGOGASTRODUODENOSCOPY (EGD);  Surgeon: Greer Pickerel, MD;  Location: [facility];  Service: General;  Laterality: N/A;  . ESOPHAGOGASTRODUODENOSCOPY N/A 02/21/2016   Procedure: ESOPHAGOGASTRODUODENOSCOPY (EGD);  Surgeon: Greer Pickerel, MD;  Location: [facility];  Service: General;  Laterality: N/A;  . IR GENERIC HISTORICAL  10/04/2015   IR [place] Vivianne Master 10/04/2015 Markus Daft, MD [facility]  . IR GENERIC HISTORICAL  01/31/2016   IR [place] GASTRO/COLONIC TUBE PERCUT W/FLUORO 01/31/2016 Corrie Mckusick, DO [facility]  . IR GENERIC HISTORICAL  04/06/2016   IR [place] TUBE PERCUT W/FLUORO 04/06/2016 Arne Cleveland, MD [facility]  . IR GENERIC HISTORICAL  05/14/2016   IR GASTRIC TUBE PERC CHG W/O IMG GUIDE 05/14/2016 Sandi Mariscal,  MD [facility]  . LAPAROSCOPIC GASTROSTOMY N/A 09/30/2015   Procedure: LAPAROSCOPIC GASTROSTOMY TUBE PLACEMENT;  Surgeon: Greer Pickerel, MD;  Location: [facility];  Service: General;  Laterality: N/A;  . LAPAROSCOPIC ROUX-EN-Y GASTRIC BYPASS WITH HIATAL HERNIA REPAIR N/A 07/08/2015   Procedure: LAPAROSCOPIC ROUX-EN-Y GASTRIC BYPASS WITH HIATAL HERNIA REPAIR WITH UPPER ENDOSCOPY;  Surgeon: Greer Pickerel, MD;  Location: [facility];  Service: General;  Laterality: N/A;  . LAPAROSCOPIC SMALL BOWEL RESECTION N/A 09/30/2015   Procedure: LAPAROSCOPIC REVISION OF ROUX LIMB;  Surgeon: Greer Pickerel, MD;  Location:  [facility];  Service: General;  Laterality: N/A;  . LAPAROSCOPY N/A 09/30/2015   Procedure: LAPAROSCOPY DIAGNOSTIC;  Surgeon: Greer Pickerel, MD;  Location: [facility];  Service: General;  Laterality: N/A;  . TUBAL LIGATION    . WISDOM TOOTH EXTRACTION     Current Outpatient Medications on File Prior to Visit  Medication Sig Dispense Refill  . amitriptyline (ELAVIL) 10 MG tablet Take 2 tablets (20 mg total) by mouth at bedtime. 60 tablet 11  . amphetamine-dextroamphetamine (ADDERALL) 10 MG tablet Take 1 tablet (10 mg total) by mouth 2 (two) times daily. 60 tablet 0  . CALCIUM-VITAMIN D PO Take by mouth 3 (three) times daily. chewable    . clonazePAM (KLONOPIN) 0.5 MG tablet Take 1 tablet (0.5 mg total) by mouth 3 (three) times daily as needed for anxiety. 90 tablet 0  . Cyanocobalamin (VITAMIN B-12) 1000 MCG/15ML LIQD Take 5 mLs by mouth daily.    Marland Kitchen docusate sodium (COLACE) 100 MG capsule Take 100 mg by mouth daily.    Marland Kitchen eletriptan (RELPAX) 40 MG tablet Take 1 tablet (40 mg total) by mouth every 2 (two) hours as needed for migraine or headache. Do not use >2 doses/24 hours 10 tablet 11  . hydrocortisone (ANUSOL-HC) 25 MG suppository Place 1 suppository (25 mg total) rectally 2 (two) times daily as needed for hemorrhoids. - after a bowel movement if possible 24 suppository 1  . lactulose (CHRONULAC) 10 GM/15ML solution Take 30 g by mouth daily as needed for moderate constipation.   0  . linaclotide (LINZESS) 145 MCG CAPS capsule Take 1 capsule (145 mcg total) by mouth daily before breakfast. As needed for chronic constipation 30 capsule 5  . metoCLOPramide (REGLAN) 5 MG tablet Take 1 tablet (5 mg total) by mouth 4 (four) times daily -  before meals and at bedtime. 120 tablet 1  . Multiple Vitamin (MULTIVITAMIN) LIQD Take by mouth 2 (two) times daily. chewable    . Nutritional Supplements (FEEDING SUPPLEMENT, VITAL HIGH PROTEIN,) LIQD liquid Place 1,000 mLs into feeding tube daily. 24ml/hr for 8hrs a day as  needed for poor oral intake; 300 ml free water q4hr (Patient taking differently: Place 1,000 mLs into feeding tube daily. 1ml/hr for 8hrs a day as needed for poor oral intake; 300 ml free water q4hr) 1000 mL 8  . ondansetron (ZOFRAN) 4 MG tablet Take 1 tablet (4 mg total) by mouth every 6 (six) hours. (Patient taking differently: Take 4 mg every 8 (eight) hours as needed by mouth for nausea or vomiting. ) 12 tablet 0  . oxyCODONE-acetaminophen (PERCOCET/ROXICET) 5-325 MG tablet Take 1 tablet every 6 (six) hours as needed by mouth for moderate pain or severe pain.   0  . polyethylene glycol (MIRALAX / GLYCOLAX) packet Take 17 g by mouth daily.    . simethicone (MYLICON) 80 MG chewable tablet Chew 80 mg by mouth every 6 (six) hours as needed for flatulence.    Marland Kitchen  topiramate (TOPAMAX) 50 MG tablet Take 1 tablet (50 mg total) by mouth daily. (Patient taking differently: Take 150 mg by mouth daily. ) 30 tablet 11  . XULANE 150-35 MCG/24HR transdermal patch Place 1 patch onto the skin once a week.   11  . ipratropium (ATROVENT) 0.03 % nasal spray Place 2 sprays into the nose 4 (four) times daily. (Patient not taking: Reported on 01/20/2017) 30 mL 1  . ondansetron (ZOFRAN-ODT) 4 MG disintegrating tablet Take 1 tablet (4 mg total) by mouth every 6 (six) hours as needed for nausea. (Patient not taking: Reported on 01/20/2017) 30 tablet 0  . oxyCODONE (OXY IR/ROXICODONE) 5 MG immediate release tablet Take 1-2 tablets (5-10 mg total) by mouth every 4 (four) hours as needed for moderate pain. (Patient not taking: Reported on 02/12/2017) 60 tablet 0   No current facility-administered medications on file prior to visit.    Allergies  Allergen Reactions  . Sulfa Antibiotics Anaphylaxis  . Zosyn [Piperacillin Sod-Tazobactam So] Rash   Family History  Problem Relation Age of Onset  . Cancer Mother   . Stroke Father   . Diabetes Brother   . Seizures Son   . Migraines Neg Hx    Social History   Socioeconomic  History  . Marital status: Divorced    Spouse name: None  . Number of children: 2  . Years of education: 42  . Highest education level: None  Social Needs  . Financial resource strain: None  . Food insecurity - worry: None  . Food insecurity - inability: None  . Transportation needs - medical: None  . Transportation needs - non-medical: None  Occupational History  . 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 Smoking status: Never Smoker  . Smokeless tobacco: Never Used  Substance and Sexual Activity  . Alcohol use: Yes    Comment: socially  . Drug use: No  . Sexual activity: Yes    Birth control/protection: Surgical  Other Topics Concern  . None  Social History Narrative   Lives with partner   Caffeine use: minimal coffee   Right-handed   Depression screen [facility] 2/9 02/12/2017 01/20/2017 01/07/2017 10/26/2016 10/12/2016  Decreased Interest 2 0 0 3 0  Down, Depressed, Hopeless 2 0 0 3 0  PHQ - 2 Score 4 0 0 6 0  Altered sleeping 3 - - 2 -  Tired, decreased energy 3 - - 2 -  Change in appetite 3 - - 2 -  Feeling bad or failure about yourself  1 - - 0 -  Trouble concentrating 2 - - 1 -  Moving slowly or fidgety/restless 1 - - 3 -  Suicidal thoughts 0 - - 0 -  PHQ-9 Score 17 - - 16 -  Difficult doing work/chores Somewhat difficult - - Extremely dIfficult -   ROS: See hpi    Objective:   Physical Exam  Constitutional: She is oriented to person, place, and time. She appears well-developed and well-nourished. No distress.  HENT:  Head: Normocephalic and atraumatic.  Right Ear: External ear normal.  Eyes: Conjunctivae are normal. No scleral icterus.  Pulmonary/Chest: Effort normal.  Neurological: She is alert and oriented to person, place, and time.  Skin: Skin is warm and dry. Rash noted. Rash is maculopapular. She is not diaphoretic. There is erythema.     Psychiatric: She has a normal mood and affect. Her behavior is normal.      BP 120/74   Pulse 94  Temp 98.7 F (37.1 C)   Resp 16   Ht 5\' 8"  (1.727 m)   Wt 183 lb 3.2 oz (83.1 kg)   SpO2 96%   BMI 27.86 kg/m   Assessment & Plan:  Consider hctz if it could increase calcium???  Need to adjust serum calcium for albumin  1. Cellulitis of right lower extremity - no sign of an abscess - is abraded on top of right foot w/ surrounding cellulitis so might be an insect bite, umbilicus also appears inflammed so try kelfex - want to avoid doxy due to stomach difficulties at baseline and allergy to sulfamethoxasole. To ER immed if worsening.  2. Hypocalcemia   3. Muscle stiffness - Send dr. Jaynee Eagles my note -  I would appreciate her insight to pt's resport of numb, heavy, cold extremities and increased fall due to reports similar to foot drop - r/o electrolyte/lab abnml as cause - high likelihood of these due to severely impaired gastric absorption - pt on gastric bypass vitamins but doesn't know exactly what they contan. Try supplementing with zinc up to 40mg  a day for sev wks.   4. Cramp and spasm   5. Protein-calorie malnutrition, moderate ([place])   6. S/P gastric bypass   7.      Depression - start trial of pristiq - has failed wellbutrin, mirtazapine, and zoloft, cont elavil 20mg  qhs  Orders Placed This Encounter  Procedures  . Comprehensive metabolic panel  . TSH  . Vitamin B12  . CBC with Differential/Platelet  . Magnesium  . Phosphorus  . PTH, Intact and Calcium  . Calcium, ionized    Meds ordered this encounter  Medications  . DISCONTD: cephALEXin (KEFLEX) 500 MG capsule    Sig: Take 1 capsule (500 mg total) by mouth 4 (four) times daily.    Dispense:  28 capsule    Refill:  0  . DISCONTD: mupirocin ointment (BACTROBAN) 2 %    Sig: Apply 1 application topically 3 (three) times daily.    Dispense:  22 g    Refill:  1  . DISCONTD: fluconazole (DIFLUCAN) 150 MG tablet    Sig: Take 1 tablet (150 mg total) by mouth once for 1 dose. After antibiotics. Repeat after 3d if needed     Dispense:  2 tablet    Refill:  0  . DISCONTD: Desvenlafaxine Succinate ER (PRISTIQ) 25 MG TB24    Sig: Take 25 mg by mouth daily.    Dispense:  30 tablet    Refill:  0  . cephALEXin (KEFLEX) 500 MG capsule    Sig: Take 1 capsule (500 mg total) by mouth 4 (four) times daily.    Dispense:  28 capsule    Refill:  0  . Desvenlafaxine Succinate ER (PRISTIQ) 25 MG TB24    Sig: Take 25 mg by mouth daily.    Dispense:  30 tablet    Refill:  0  . fluconazole (DIFLUCAN) 150 MG tablet    Sig: Take 1 tablet (150 mg total) by mouth once for 1 dose. After antibiotics. Repeat after 3d if needed    Dispense:  2 tablet    Refill:  0  . mupirocin ointment (BACTROBAN) 2 %    Sig: Apply 1 application topically 3 (three) times daily.    Dispense:  22 g    Refill:  1   [name], M.D.  Primary Care at [facility] [address] [place], [place] [address] [phone] phone [phone])  [phone] fax  02/15/17 2:57 PM

## 2017-02-12 NOTE — Telephone Encounter (Signed)
Prescribed pt Pristiq - I think it will require a PA - she has failed wellbutrin, mirtazapine, amitriptyline, and sertraline. Also on (so failed) topamax, adderall, and klonopin). Thanks!

## 2017-02-12 NOTE — Patient Instructions (Addendum)
IF you received an x-ray today, you will receive an invoice from [facility]. Please contact [facility] at [phone] with questions or concerns regarding your invoice.   IF you received labwork today, you will receive an invoice from [facility]. Please contact [facility] at [phone] with questions or concerns regarding your invoice.   Our billing staff will not be able to assist you with questions regarding bills from these companies.  You will be contacted with the lab results as soon as they are available. The fastest way to get your results is to activate your My Chart account. Instructions are located on the last page of this paperwork. If you have not heard from [name] regarding the results in 2 weeks, please contact this office.     Hypocalcemia, Adult Hypocalcemia is when the level of calcium in a person's blood is below normal. Calcium is a mineral that is used by the body in many ways. A lack of blood calcium can affect the heart and muscles, make the bones more likely to break, and cause other problems. What are the causes? This condition may be caused by:  Decreased production (hypoparathyroidism) or improper use of parathyroid hormone.  Problems with the parathyroid glands or surgical removal of these glands.  Problems with parathyroid function after removal of the thyroid gland.  Lack (deficiency) of vitamin D or magnesium or both.  Kidney problems.  Less common causes include:  Intestinal problems that interfere with nutrient absorption.  Alcoholism.  Low levels of a body protein that is called albumin.  Inflammation of the pancreas (pancreatitis).  Certain medicines.  Severe infections (sepsis).  Certain diseases, such as sarcoidosis or hemochromatosis, that cause the parathyroid glands to be filled with cells or substances that are not normally present.  Breakdown of large amounts of muscle fiber.  High levels of phosphate in the  body.  Cancer.  Massive blood transfusions, which usually occur with severe trauma.  What are the signs or symptoms? Symptoms of this condition include:  Numbness and tingling in the fingers, toes, or around the mouth.  Muscle aches or cramps, especially in the legs, feet, and back.  Muscle twitches.  Abdominal cramping or pain.  Memory problems, confusion, or difficulty thinking.  Depression, anxiety, irritability, or changes in personality.  Fainting.  Chest pain.  Difficulty swallowing.  Changes in the sound of the voice.  Shortness of breath or wheezing.  General weakness and fatigue.  Symptoms of severe hypocalcemia include:  Shaking uncontrollably (seizures).  Seizure of the voice box (laryngospasm).  Fast heartbeats (palpitations) and abnormal heart rhythms (arrhythmias).  Long-term symptoms of this condition include:  Coarse, brittle hair and nails.  Dry skin or lasting (chronic) skin diseases (psoriasis, eczema,, or dermatitis).  Clouding of the eye lens (cataracts).  How is this diagnosed? This condition is usually diagnosed with a blood test. You may also have other tests to help determine the underlying cause of the condition. For example, a test may be done that records the electrical activity of the heart (electrocardiogram,or ECG). How is this treated? Treatment for this condition may include:  Calcium given by mouth (orally) or given through an IV tube that is inserted into one of your veins. The method used for giving calcium will depend on the severity of the condition.  Other minerals (electrolytes), such as magnesium.  Other treatment will depend on the cause of the condition. Follow these instructions at home:  Follow diet instructions from your health care  provider or dietitian.  Take supplements only as told by your health care provider.  Keep all follow-up visits as told by your health care provider. This is important. Contact  a health care provider if:  You have increased fatigue.  You have increased muscle twitching.  You have new swelling in the feet, ankles, or legs.  You develop changes in mood, memory, or personality. Get help right away if:  You have chest pain.  You have persistent rapid or irregular heartbeats.  You have difficulty breathing.  You faint.  You start to have seizures.  You have confusion. This information is not intended to replace advice given to you by your health care provider. Make sure you discuss any questions you have with your health care provider. Document Released: 08/13/2009 Document Revised: 08/01/2015 Document Reviewed: 07/11/2014 Elsevier Interactive Patient Education  2018 [employer].  Peripheral Edema Peripheral edema is swelling that is caused by a buildup of fluid. Peripheral edema most often affects the lower legs, ankles, and feet. It can also develop in the arms, hands, and face. The area of the body that has peripheral edema will look swollen. It may also feel heavy or warm. Your clothes may start to feel tight. Pressing on the area may make a temporary dent in your skin. You may not be able to move your arm or leg as much as usual. There are many causes of peripheral edema. It can be a complication of other diseases, such as congestive heart failure, kidney disease, or a problem with your blood circulation. It also can be a side effect of certain medicines. It often happens to women during pregnancy. Sometimes, the cause is not known. Treating the underlying condition is often the only treatment for peripheral edema. Follow these instructions at home: Pay attention to any changes in your symptoms. Take these actions to help with your discomfort:  Raise (elevate) your legs while you are sitting or lying down.  Move around often to prevent stiffness and to lessen swelling. Do not sit or stand for long periods of time.  Wear support stockings as told by  your health care provider.  Follow instructions from your health care provider about limiting salt (sodium) in your diet. Sometimes eating less salt can reduce swelling.  Take over-the-counter and prescription medicines only as told by your health care provider. Your health care provider may prescribe medicine to help your body get rid of excess water (diuretic).  Keep all follow-up visits as told by your health care provider. This is important.  Contact a health care provider if:  You have a fever.  Your edema starts suddenly or is getting worse, especially if you are pregnant or have a medical condition.  You have swelling in only one leg.  You have increased swelling and pain in your legs. Get help right away if:  You develop shortness of breath, especially when you are lying down.  You have pain in your chest or abdomen.  You feel weak.  You faint. This information is not intended to replace advice given to you by your health care provider. Make sure you discuss any questions you have with your health care provider. Document Released: 04/02/2004 Document Revised: 07/29/2015 Document Reviewed: 09/05/2014 Elsevier Interactive Patient Education  [employer].

## 2017-02-13 LAB — CBC WITH DIFFERENTIAL/PLATELET
BASOS: 1 %
Basophils Absolute: 0.1 10*3/uL (ref 0.0–0.2)
EOS (ABSOLUTE): 0.1 10*3/uL (ref 0.0–0.4)
EOS: 2 %
HEMATOCRIT: 38.4 % (ref 34.0–46.6)
Hemoglobin: 12.5 g/dL (ref 11.1–15.9)
IMMATURE GRANULOCYTES: 0 %
Immature Grans (Abs): 0 10*3/uL (ref 0.0–0.1)
LYMPHS ABS: 1.8 10*3/uL (ref 0.7–3.1)
Lymphs: 23 %
MCH: 31.6 pg (ref 26.6–33.0)
MCHC: 32.6 g/dL (ref 31.5–35.7)
MCV: 97 fL (ref 79–97)
MONOS ABS: 0.5 10*3/uL (ref 0.1–0.9)
Monocytes: 7 %
NEUTROS ABS: 5.3 10*3/uL (ref 1.4–7.0)
Neutrophils: 67 %
Platelets: 333 10*3/uL (ref 150–379)
RBC: 3.96 x10E6/uL (ref 3.77–5.28)
RDW: 13.5 % (ref 12.3–15.4)
WBC: 7.8 10*3/uL (ref 3.4–10.8)

## 2017-02-13 LAB — PTH, INTACT AND CALCIUM
CALCIUM: 8.7 mg/dL (ref 8.7–10.2)
PTH: 42 pg/mL (ref 15–65)

## 2017-02-13 LAB — COMPREHENSIVE METABOLIC PANEL
A/G RATIO: 1.5 (ref 1.2–2.2)
ALBUMIN: 3.9 g/dL (ref 3.5–5.5)
ALK PHOS: 53 IU/L (ref 39–117)
ALT: 18 IU/L (ref 0–32)
AST: 23 IU/L (ref 0–40)
BUN / CREAT RATIO: 13 (ref 9–23)
BUN: 17 mg/dL (ref 6–24)
CHLORIDE: 109 mmol/L — AB (ref 96–106)
CO2: 21 mmol/L (ref 20–29)
Calcium: 8.8 mg/dL (ref 8.7–10.2)
Creatinine, Ser: 1.32 mg/dL — ABNORMAL HIGH (ref 0.57–1.00)
GFR calc non Af Amer: 48 mL/min/{1.73_m2} — ABNORMAL LOW (ref >59)
GFR, EST AFRICAN AMERICAN: 55 mL/min/{1.73_m2} — AB (ref >59)
Globulin, Total: 2.6 g/dL (ref 1.5–4.5)
Glucose: 61 mg/dL — ABNORMAL LOW (ref 65–99)
POTASSIUM: 4.3 mmol/L (ref 3.5–5.2)
SODIUM: 141 mmol/L (ref 134–144)
TOTAL PROTEIN: 6.5 g/dL (ref 6.0–8.5)

## 2017-02-13 LAB — CALCIUM, IONIZED: CALCIUM ION: 4.8 mg/dL (ref 4.5–5.6)

## 2017-02-13 LAB — TSH: TSH: 1.24 u[IU]/mL (ref 0.450–4.500)

## 2017-02-13 LAB — PHOSPHORUS: Phosphorus: 2.8 mg/dL (ref 2.5–4.5)

## 2017-02-13 LAB — MAGNESIUM: Magnesium: 2.1 mg/dL (ref 1.6–2.3)

## 2017-02-13 LAB — VITAMIN B12: Vitamin B-12: 679 pg/mL (ref 232–1245)

## 2017-02-14 ENCOUNTER — Telehealth: Payer: Self-pay | Admitting: *Deleted

## 2017-02-14 NOTE — Telephone Encounter (Signed)
Called and left message with [name] (on DPR). He verbalized understanding and will notify patient that office is closed tomorrow 12/10 due to snow and we will call back ASAP to reschedule.

## 2017-02-15 ENCOUNTER — Ambulatory Visit: Payer: Self-pay | Admitting: Neurology

## 2017-02-16 MED FILL — TOPIRAMATE 50 MG TABLET: 50 | 30 days supply | Qty: 30 | Fill #3

## 2017-02-17 NOTE — Telephone Encounter (Signed)
Amazing! Thank you so much [name]!!

## 2017-02-17 NOTE — Telephone Encounter (Signed)
I called patient's pharmacy and the pharmacist there told me it is covered under patient's [employer] policy.

## 2017-02-18 MED FILL — SUCRALFATE 1 GM TABLET: 1 | 30 days supply | Qty: 90 | Fill #0

## 2017-02-18 NOTE — Telephone Encounter (Signed)
Called and spoke w/ pt. Per Dr. Jaynee Eagles, pt can see Megan. Patient fine with that and was r/s to Monday 02/22/17 @ 07:30 with an arrival time of 7:00. She verbalized understanding.

## 2017-02-18 NOTE — Telephone Encounter (Signed)
Pt called back to r/s appt, RN was not available to speak with regarding an appt for December. An appt was scheduled for 1/28, she would like to be seen sooner. Please call to advise at [phone]

## 2017-02-19 ENCOUNTER — Ambulatory Visit (HOSPITAL_COMMUNITY)
Admission: RE | Admit: 2017-02-19 | Discharge: 2017-02-19 | Disposition: A | Discharge status: Home / Self Care | Payer: BLUE CROSS/BLUE SHIELD | Source: Ambulatory Visit | Attending: General Surgery | Admitting: General Surgery

## 2017-02-19 ENCOUNTER — Other Ambulatory Visit: Payer: Self-pay | Admitting: General Surgery

## 2017-02-19 DIAGNOSIS — R110 Nausea: Secondary | ICD-10-CM | POA: Diagnosis present

## 2017-02-19 LAB — COMPREHENSIVE METABOLIC PANEL
ALK PHOS: 43 U/L (ref 38–126)
ALT: 19 U/L (ref 14–54)
AST: 25 U/L (ref 15–41)
Albumin: 3 g/dL — ABNORMAL LOW (ref 3.5–5.0)
Anion gap: 6 (ref 5–15)
BUN: 18 mg/dL (ref 6–20)
CALCIUM: 8.4 mg/dL — AB (ref 8.9–10.3)
CHLORIDE: 107 mmol/L (ref 101–111)
CO2: 23 mmol/L (ref 22–32)
CREATININE: 1.31 mg/dL — AB (ref 0.44–1.00)
GFR calc Af Amer: 55 mL/min — ABNORMAL LOW (ref >60)
GFR, EST NON AFRICAN AMERICAN: 48 mL/min — AB (ref >60)
Glucose, Bld: 74 mg/dL (ref 65–99)
Potassium: 3.8 mmol/L (ref 3.5–5.1)
SODIUM: 136 mmol/L (ref 135–145)
Total Bilirubin: 0.5 mg/dL (ref 0.3–1.2)
Total Protein: 6.3 g/dL — ABNORMAL LOW (ref 6.5–8.1)

## 2017-02-19 LAB — PREALBUMIN: Prealbumin: 29.4 mg/dL (ref 18–38)

## 2017-02-19 MED ORDER — M.V.I. ADULT IV INJ
INJECTION | INTRAVENOUS | Status: DC
  Administered 2017-02-19: 10:00:00 via INTRAVENOUS
  Filled 2017-02-19 (×4): qty 1000

## 2017-02-19 MED ORDER — SODIUM CHLORIDE 0.9 % IV BOLUS (SEPSIS)
500.0000 mL | Freq: Once | INTRAVENOUS | Status: AC
  Administered 2017-02-19: 999 mL via INTRAVENOUS

## 2017-02-19 MED ORDER — SODIUM CHLORIDE 0.9 % IV SOLN: INTRAVENOUS | Status: DC

## 2017-02-19 MED ORDER — PANTOPRAZOLE SODIUM 40 MG PO PACK: 40.0000 mg | PACK | Freq: Every day | ORAL | Status: DC

## 2017-02-19 MED ORDER — OXYCODONE HCL 5 MG/5ML PO SOLN
5.0000 mg | ORAL | Status: DC | PRN
  Administered 2017-02-19: 5 mg via ORAL
  Filled 2017-02-19: qty 10

## 2017-02-19 MED ORDER — ONDANSETRON HCL 4 MG/2ML IJ SOLN: 4.0000 mg | INTRAMUSCULAR | Status: DC | PRN

## 2017-02-19 MED ORDER — RANITIDINE HCL 150 MG/10ML PO SYRP
150.0000 mg | ORAL_SOLUTION | Freq: Every day | ORAL | Status: DC
  Administered 2017-02-19: 150 mg
  Filled 2017-02-19: qty 10

## 2017-02-19 MED ORDER — ONDANSETRON 4 MG PO TBDP
4.0000 mg | ORAL_TABLET | ORAL | Status: DC | PRN
  Administered 2017-02-19: 4 mg via ORAL
  Filled 2017-02-19: qty 1

## 2017-02-19 MED FILL — PANTOPRAZOLE SOD DR 40 MG T: 40 | 30 days supply | Qty: 30 | Fill #0

## 2017-02-19 NOTE — Progress Notes (Signed)
Provider Greer Pickerel   Procedure: sodium chloride 0.9 % 1,000 mL with thiamine 619 mg, folic acid 1 mg, multivitamins adult 10 mL infusion and 500 mL bolus  Patient tolerated procedure well. Discharge instructions given to patient and patient states an understanding. Patient alert, oriented, and ambulatory at time of discharge.

## 2017-02-19 NOTE — Discharge Instructions (Signed)
Patient received Sodium chloride 0.9 % 1,000 mL with thiamine 357 mg, folic acid 1 mg, multivitamins adult 10 mL infusion and 500 ml Sodium Chloride bolus.

## 2017-02-22 ENCOUNTER — Ambulatory Visit: Payer: Self-pay | Admitting: Adult Health

## 2017-02-22 ENCOUNTER — Encounter: Payer: Self-pay | Admitting: Adult Health

## 2017-02-22 ENCOUNTER — Ambulatory Visit: Payer: BLUE CROSS/BLUE SHIELD | Admitting: Adult Health

## 2017-02-22 VITALS — BP 122/73 | HR 69 | Ht 68.0 in | Wt 190.0 lb

## 2017-02-22 DIAGNOSIS — G43019 Migraine without aura, intractable, without status migrainosus: Secondary | ICD-10-CM | POA: Diagnosis not present

## 2017-02-22 DIAGNOSIS — Z5181 Encounter for therapeutic drug level monitoring: Secondary | ICD-10-CM

## 2017-02-22 DIAGNOSIS — Z8669 Personal history of other diseases of the nervous system and sense organs: Secondary | ICD-10-CM | POA: Diagnosis not present

## 2017-02-22 MED ORDER — TOPIRAMATE 50 MG PO TABS: 75.0000 mg | ORAL_TABLET | Freq: Every day | ORAL | 11 refills | Status: DC

## 2017-02-22 NOTE — Progress Notes (Signed)
Personally  participated in, made any corrections needed, and agree with history, physical, neuro exam,assessment and plan as stated above.    Antonia Ahern, MD [facility] 

## 2017-02-22 NOTE — Progress Notes (Signed)
PATIENT: Margaret Fletcher DOB: [birth year]  REASON FOR VISIT: follow up HISTORY FROM: patient  HISTORY OF PRESENT ILLNESS: Today 02/22/17] [name] is a 46 year old female with a history of Adie's eye and migraine headaches.  She returns today for follow-up.  She states in November she developed a severe headache.  She states that it was accompanied by heaviness in the right arm and in the legs.  She states that she had trouble holding things in the right hand.  She went to the emergency room and diagnosed with a complex migraine.  She reports that once her headache was better the other symptoms improved as well.  She states that since her visit to the ED in November she has essentially has had a headache daily.  She reports that some days her headaches are worse than others.  Had a sleep study in the past that did not show sleep apnea.  She is currently on Topamax 50 mg at bedtime and amitriptyline 20 mg at bedtime.  She reports that her primary care recently put her on Pristiq for depression.  She continues to use Relpax for her headaches.  In the past the patient's topamax dose was decreased due to worsening fatigue, depression and memory problems.  She does feel that since her Topamax dose has been decreased her headaches have gotten worse.  She remains on amitriptyline 20 mg at bedtime.  She does report that it makes her feel very groggy the next morning.  She returns today for an evaluation.  HISTORY 09/14/16: Margaret Fletcher is a 46 y.o. female here as a referral from Dr. Brigitte Pulse for dilated pupil. She was diagnosed with Adie's eye. She had a headache, some headache, ear congestion and then noticed that her pupil was dilated. Her eye gets sore even now. She has lost 120 pounds. Her headaches are better. It is hard to eat. She was denied for disability. Itis in appeal. She applied for medicaid. She wears sunglasses. She is having blurry vision in the eye. The left eye is blurry. She was evaluated by  ophthalmology. Going outside is hard. No improvement. She takes 150mg  Topiramate daily. She is still having headaches every day, she takes eletriptan, She takes the eletriptan when the headaches are migrainous. She has daily headaches that last all day long. She is under a lot of stress, headaches better when it is dark, the headache feels like eye strain.   Reviewed notes, labs and imaging from outside physicians, which showed:    CT head showed No acute intracranial abnormalities including mass lesion or mass effect, hydrocephalus, extra-axial fluid collection, midline shift, hemorrhage, or acute infarction, large ischemic events (personally reviewed images)   hgba1c 5.3, RPR NR   REVIEW OF SYSTEMS: Out of a complete 14 system review of symptoms, the patient complains only of the following symptoms, and all other reviewed systems are negative.  See HPI  ALLERGIES: Allergies  Allergen Reactions  . Sulfa Antibiotics Anaphylaxis  . Zosyn [Piperacillin Sod-Tazobactam So] Rash    HOME MEDICATIONS: Outpatient Medications Prior to Visit  Medication Sig Dispense Refill  . amitriptyline (ELAVIL) 10 MG tablet Take 2 tablets (20 mg total) by mouth at bedtime. 60 tablet 11  . amphetamine-dextroamphetamine (ADDERALL) 10 MG tablet Take 1 tablet (10 mg total) by mouth 2 (two) times daily. 60 tablet 0  . CALCIUM-VITAMIN D PO Take by mouth 3 (three) times daily. chewable    . cephALEXin (KEFLEX) 500 MG capsule Take 1 capsule (  500 mg total) by mouth 4 (four) times daily. 28 capsule 0  . clonazePAM (KLONOPIN) 0.5 MG tablet Take 1 tablet (0.5 mg total) by mouth 3 (three) times daily as needed for anxiety. 90 tablet 0  . Cyanocobalamin (VITAMIN B-12) 1000 MCG/15ML LIQD Take 5 mLs by mouth daily.    Marland Kitchen Desvenlafaxine Succinate ER (PRISTIQ) 25 MG TB24 Take 25 mg by mouth daily. 30 tablet 0  . docusate sodium (COLACE) 100 MG capsule Take 100 mg by mouth daily.    Marland Kitchen eletriptan (RELPAX) 40 MG tablet  Take 1 tablet (40 mg total) by mouth every 2 (two) hours as needed for migraine or headache. Do not use >2 doses/24 hours 10 tablet 11  . hydrocortisone (ANUSOL-HC) 25 MG suppository Place 1 suppository (25 mg total) rectally 2 (two) times daily as needed for hemorrhoids. - after a bowel movement if possible 24 suppository 1  . ipratropium (ATROVENT) 0.03 % nasal spray Place 2 sprays into the nose 4 (four) times daily. 30 mL 1  . lactulose (CHRONULAC) 10 GM/15ML solution Take 30 g by mouth daily as needed for moderate constipation.   0  . linaclotide (LINZESS) 145 MCG CAPS capsule Take 1 capsule (145 mcg total) by mouth daily before breakfast. As needed for chronic constipation 30 capsule 5  . metoCLOPramide (REGLAN) 5 MG tablet Take 1 tablet (5 mg total) by mouth 4 (four) times daily -  before meals and at bedtime. 120 tablet 1  . Multiple Vitamin (MULTIVITAMIN) LIQD Take by mouth 2 (two) times daily. chewable    . mupirocin ointment (BACTROBAN) 2 % Apply 1 application topically 3 (three) times daily. 22 g 1  . Nutritional Supplements (FEEDING SUPPLEMENT, VITAL HIGH PROTEIN,) LIQD liquid Place 1,000 mLs into feeding tube daily. 60ml/hr for 8hrs a day as needed for poor oral intake; 300 ml free water q4hr (Patient taking differently: Place 1,000 mLs into feeding tube daily. 61ml/hr for 8hrs a day as needed for poor oral intake; 300 ml free water q4hr) 1000 mL 8  . ondansetron (ZOFRAN) 4 MG tablet Take 1 tablet (4 mg total) by mouth every 6 (six) hours. (Patient taking differently: Take 4 mg every 8 (eight) hours as needed by mouth for nausea or vomiting. ) 12 tablet 0  . ondansetron (ZOFRAN-ODT) 4 MG disintegrating tablet Take 1 tablet (4 mg total) by mouth every 6 (six) hours as needed for nausea. 30 tablet 0  . oxyCODONE (OXY IR/ROXICODONE) 5 MG immediate release tablet Take 1-2 tablets (5-10 mg total) by mouth every 4 (four) hours as needed for moderate pain. 60 tablet 0  . oxyCODONE-acetaminophen  (PERCOCET/ROXICET) 5-325 MG tablet Take 1 tablet every 6 (six) hours as needed by mouth for moderate pain or severe pain.   0  . polyethylene glycol (MIRALAX / GLYCOLAX) packet Take 17 g by mouth daily.    . simethicone (MYLICON) 80 MG chewable tablet Chew 80 mg by mouth every 6 (six) hours as needed for flatulence.    . topiramate (TOPAMAX) 50 MG tablet Take 1 tablet (50 mg total) by mouth daily. (Patient taking differently: Take 150 mg by mouth daily. ) 30 tablet 11  . XULANE 150-35 MCG/24HR transdermal patch Place 1 patch onto the skin once a week.   11   No facility-administered medications prior to visit.     PAST MEDICAL HISTORY: Past Medical History:  Diagnosis Date  . Alopecia   . Anxiety   . Autoimmune disease ([place])    "  Alopecia"  . Bacterial vaginosis   . Cyst of spleen   . Depression   . Frequent UTI   . Hypertrophy of breast   . IIH (idiopathic intracranial hypertension) 2017   "related to headaches"  . Migraine headache    denies, ruled out - Propranolol.Using for headaches    PAST SURGICAL HISTORY: Past Surgical History:  Procedure Laterality Date  . APPENDECTOMY    . BREAST REDUCTION SURGERY Bilateral 03/26/2015   Procedure: BILATERAL BREAST REDUCTION  ;  Surgeon: Youlanda Roys, MD;  Location: [facility];  Service: Plastics;  Laterality: Bilateral;  . BREATH TEK H PYLORI N/A 05/21/2014   Procedure: BREATH TEK H PYLORI;  Surgeon: Greer Pickerel, MD;  Location: [facility];  Service: General;  Laterality: N/A;  . CESAREAN SECTION    . CESAREAN SECTION    . CHOLECYSTECTOMY    . DILITATION & CURRETTAGE/HYSTROSCOPY WITH NOVASURE ABLATION N/A 07/19/2012   Procedure: HYSTEROSCOPY WITH NOVASURE ABLATION;  Surgeon: Farrel Gobble. Harrington Challenger, MD;  Location: [facility];  Service: Gynecology;  Laterality: N/A;  . ENDOMETRIAL ABLATION    . ESOPHAGOGASTRODUODENOSCOPY N/A 08/16/2015   Procedure: UPPER ESOPHAGOGASTRODUODENOSCOPY (EGD);  Surgeon: Greer Pickerel, MD;  Location:  [facility];  Service: General;  Laterality: N/A;  . ESOPHAGOGASTRODUODENOSCOPY N/A 02/21/2016   Procedure: ESOPHAGOGASTRODUODENOSCOPY (EGD);  Surgeon: Greer Pickerel, MD;  Location: [facility];  Service: General;  Laterality: N/A;  . IR GENERIC HISTORICAL  10/04/2015   IR [place] Vivianne Master 10/04/2015 Markus Daft, MD [facility]  . IR GENERIC HISTORICAL  01/31/2016   IR [place] GASTRO/COLONIC TUBE PERCUT W/FLUORO 01/31/2016 Corrie Mckusick, DO [facility]  . IR GENERIC HISTORICAL  04/06/2016   IR [place] TUBE PERCUT W/FLUORO 04/06/2016 Arne Cleveland, MD [facility]  . IR GENERIC HISTORICAL  05/14/2016   IR GASTRIC TUBE PERC CHG W/O IMG GUIDE 05/14/2016 Sandi Mariscal, MD [facility]  . LAPAROSCOPIC GASTROSTOMY N/A 09/30/2015   Procedure: LAPAROSCOPIC GASTROSTOMY TUBE PLACEMENT;  Surgeon: Greer Pickerel, MD;  Location: [facility];  Service: General;  Laterality: N/A;  . LAPAROSCOPIC ROUX-EN-Y GASTRIC BYPASS WITH HIATAL HERNIA REPAIR N/A 07/08/2015   Procedure: LAPAROSCOPIC ROUX-EN-Y GASTRIC BYPASS WITH HIATAL HERNIA REPAIR WITH UPPER ENDOSCOPY;  Surgeon: Greer Pickerel, MD;  Location: [facility];  Service: General;  Laterality: N/A;  . LAPAROSCOPIC SMALL BOWEL RESECTION N/A 09/30/2015   Procedure: LAPAROSCOPIC REVISION OF ROUX LIMB;  Surgeon: Greer Pickerel, MD;  Location: [facility];  Service: General;  Laterality: N/A;  . LAPAROSCOPY N/A 09/30/2015   Procedure: LAPAROSCOPY DIAGNOSTIC;  Surgeon: Greer Pickerel, MD;  Location: [facility];  Service: General;  Laterality: N/A;  . TUBAL LIGATION    . WISDOM TOOTH EXTRACTION      FAMILY HISTORY: Family History  Problem Relation Age of Onset  . Cancer Mother   . Stroke Father   . Diabetes Brother   . Seizures Son   . Migraines Neg Hx     SOCIAL HISTORY: Social History   Socioeconomic History  . Marital status: Divorced    Spouse name: Not on file  . Number of children: 2  . Years of education: 4  . Highest education level: Not on file    Social Needs  . Financial resource strain: Not on file  . Food insecurity - worry: Not on file  . Food insecurity - inability: Not on file  . Transportation needs - medical: Not on file  . Transportation needs - non-medical: Not on file  Occupational  History  . 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 Smoking status: Never Smoker  . Smokeless tobacco: Never Used  Substance and Sexual Activity  . Alcohol use: Yes    Comment: socially  . Drug use: No  . Sexual activity: Yes    Birth control/protection: Surgical  Other Topics Concern  . Not on file  Social History Narrative   Lives with partner   Caffeine use: minimal coffee   Right-handed      PHYSICAL EXAM  Vitals:   02/22/17 0736  BP: 122/73  Pulse: 69  Weight: 190 lb (86.2 kg)  Height: 5\' 8"  (1.727 m)   Body mass index is 28.89 kg/m.  Generalized: Well developed, in no acute distress   Neurological examination  Mentation: Alert oriented to time, place, history taking. Follows all commands speech and language fluent Cranial nerve II-XII: Pupils were equal round reactive to light. Extraocular movements were full, visual field were full on confrontational test. Facial sensation and strength were normal. Uvula tongue midline. Head turning and shoulder shrug  were normal and symmetric. Motor: The motor testing reveals 5 over 5 strength of all 4 extremities. Good symmetric motor tone is noted throughout.  Sensory: Sensory testing is intact to soft touch on all 4 extremities. No evidence of extinction is noted.  Coordination: Cerebellar testing reveals good finger-nose-finger and heel-to-shin bilaterally.  Gait and station: Gait is normal. Tandem gait is normal. Romberg is negative. No drift is seen.  Reflexes: Deep tendon reflexes are symmetric and normal bilaterally.   DIAGNOSTIC DATA (LABS, IMAGING, TESTING) - I reviewed patient records, labs, notes, testing and imaging myself where available.  Lab  Results  Component Value Date   WBC 7.8 02/12/2017   HGB 12.5 02/12/2017   HCT 38.4 02/12/2017   MCV 97 02/12/2017   PLT 333 02/12/2017      Component Value Date/Time   NA 136 02/19/2017 0925   NA 141 02/12/2017 1735   K 3.8 02/19/2017 0925   CL 107 02/19/2017 0925   CO2 23 02/19/2017 0925   GLUCOSE 74 02/19/2017 0925   BUN 18 02/19/2017 0925   BUN 17 02/12/2017 1735   CREATININE 1.31 (H) 02/19/2017 0925   CREATININE 0.95 11/15/2015 1712   CALCIUM 8.4 (L) 02/19/2017 0925   PROT 6.3 (L) 02/19/2017 0925   PROT 6.5 02/12/2017 1735   ALBUMIN 3.0 (L) 02/19/2017 0925   ALBUMIN 3.9 02/12/2017 1735   AST 25 02/19/2017 0925   ALT 19 02/19/2017 0925   ALKPHOS 43 02/19/2017 0925   BILITOT 0.5 02/19/2017 0925   BILITOT <0.2 02/12/2017 1735   GFRNONAA 48 (L) 02/19/2017 0925   GFRNONAA 73 11/15/2015 1712   GFRAA 55 (L) 02/19/2017 0925   GFRAA 84 11/15/2015 1712   No results found for: CHOL, HDL, LDLCALC, LDLDIRECT, TRIG, CHOLHDL Lab Results  Component Value Date   HGBA1C 5.3 03/16/2016   Lab Results  Component Value Date   VITAMINB12 679 02/12/2017   Lab Results  Component Value Date   TSH 1.240 02/12/2017      ASSESSMENT AND PLAN 46 y.o. year old female  has a past medical history of Alopecia, Anxiety, Autoimmune disease ([place]), Bacterial vaginosis, Cyst of spleen, Depression, Frequent UTI, Hypertrophy of breast, IIH (idiopathic intracranial hypertension) (2017), and Migraine headache. here with:  1.  Migraine headache 2.  History of idiopathic intracranial hypertension   The patient's migraine headaches have changed over the last month.  She is now having  migraines that is accompanied by weakness and heaviness in the extremities.  I recommend that we do an MRI of the brain with and without contrast.  The patient had recent blood work that showed that her creatinine was elevated.  She reports that when this blood work was completed it was before she had hydration infusion.   I will recheck blood work today to confirm her creatinine level.  If it remains elevated we will do an MRI of the brain without contrast.  The patient will increase Topamax to 75 mg at bedtime.  I advised that she should monitor for worsening depression, fatigue and memory problems.  She will remain on amitriptyline 20 mg at bedtime.  She should also keep a headache journal.  She will follow-up in 3-4 months or sooner if needed.  Ward Givens, MSN, NP-C 02/22/2017, 7:40 AM [facility] [address] [place], [place] [address] [phone]

## 2017-02-22 NOTE — Patient Instructions (Signed)
Your Plan:  Increase Topamax to 75 mg at bedtime.  Monitor for worsening depression, fatigue and memory problems Please keep a headache journal. MRI of the brain If your symptoms worsen or you develop new symptoms please let us know.   Thank you for coming to see [name] at [facility]. I hope we have been able to provide you high quality care today.  You may receive a patient satisfaction survey over the next few weeks. We would appreciate your feedback and comments so that we may continue to improve ourselves and the health of our patients.

## 2017-02-23 ENCOUNTER — Encounter: Payer: BLUE CROSS/BLUE SHIELD | Attending: General Surgery | Admitting: Skilled Nursing Facility1

## 2017-02-23 ENCOUNTER — Encounter: Payer: Self-pay | Admitting: Skilled Nursing Facility1

## 2017-02-23 ENCOUNTER — Ambulatory Visit: Payer: Self-pay | Admitting: General Surgery

## 2017-02-23 DIAGNOSIS — E440 Moderate protein-calorie malnutrition: Secondary | ICD-10-CM

## 2017-02-23 DIAGNOSIS — Z713 Dietary counseling and surveillance: Secondary | ICD-10-CM | POA: Diagnosis present

## 2017-02-23 LAB — COMPREHENSIVE METABOLIC PANEL
A/G RATIO: 1.3 (ref 1.2–2.2)
ALBUMIN: 3.6 g/dL (ref 3.5–5.5)
ALT: 18 IU/L (ref 0–32)
AST: 24 IU/L (ref 0–40)
Alkaline Phosphatase: 55 IU/L (ref 39–117)
BUN / CREAT RATIO: 15 (ref 9–23)
BUN: 21 mg/dL (ref 6–24)
CHLORIDE: 108 mmol/L — AB (ref 96–106)
CO2: 21 mmol/L (ref 20–29)
Calcium: 8.6 mg/dL — ABNORMAL LOW (ref 8.7–10.2)
Creatinine, Ser: 1.4 mg/dL — ABNORMAL HIGH (ref 0.57–1.00)
GFR calc non Af Amer: 45 mL/min/{1.73_m2} — ABNORMAL LOW (ref >59)
GFR, EST AFRICAN AMERICAN: 52 mL/min/{1.73_m2} — AB (ref >59)
GLOBULIN, TOTAL: 2.8 g/dL (ref 1.5–4.5)
Glucose: 49 mg/dL — ABNORMAL LOW (ref 65–99)
POTASSIUM: 4.4 mmol/L (ref 3.5–5.2)
SODIUM: 141 mmol/L (ref 134–144)
TOTAL PROTEIN: 6.4 g/dL (ref 6.0–8.5)

## 2017-02-23 NOTE — Progress Notes (Signed)
[facility] Surgery  Medical Nutrition Therapy:  Appt start time: 400 end time: 435  Primary concerns today: Post-operative Bariatric Surgery Nutrition Management.  Margaret Fletcher returns for a bariatric nutrition follow up post op RYGB.  Pt complains of suffering pain and states she has not been able to eat and has had burning in her throat. Pt states she is in a crisis and feels she cannot control anything. Pt states she did a rehydration Friday and has had no relief which has scared her. Pt states feels a slight relief in the burning when she pats her chest. Pt states she wants to eat and has hunger but cannot eat it due to severe nausea. Pt states she is at the limits of taking her pain medication with no relief. Pt states she has had diarrhea since Friday after her rehydration visit. Pt states she has only had the liquid coffee drink since Friday. Pt states warm liquid and heating pads helps her stomach feel a Little tolerable. Pt staets she wants to find releif and is srying throughout the appointment because she is in so much pain  Surgery date: 07/08/2015 Surgery type: RYGB Start weight at [facility]: 278 lbs on 05/24/2014 Weight today: 181 lbs Weight change: 10lbs gained  TANITA  BODY COMP RESULTS  07/01/15 07/23/15 10/31/15 02/07/16 03/16/16 04/20/16 06/22/2016 08/24/2016 11/23/2016 01/11/2017 01/25/2017 02/23/2017   BMI (kg/m^2) 40.4 38.6 33.4 30.4 29 28.3 28.5 29.3 25.8 27.6 27.1 28.2   Fat Mass (lbs) 138.5 131.0 103.2 84 80 74 69.6 67.8 54.4 58.8 65.6 67   Fat Free Mass (lbs) 127.5 122.8 116.4 116.2 110.6 112.2 118 119.4 115.6 122.4 112.4 118.6   Total Body Water (lbs) 93.5 90.4 84.4 83.4 79.2 80.2 84.2 85.2 82.4 87.2 80 84.6    Preferred Learning Style:   No preference indicated   Learning Readiness:   Ready  Fluid intake: 24 oz water and tea + 8oz can enteral formula  Estimated total protein intake: unable to determine  Medications: see list Supplementation: chewable multi and calcium and B12  liquid dropper  Drinking while eating: yes Hair loss: unknown, patient has alopecia Carbonated beverages: no N/V/D/C: nausea and constipation Dumping syndrome: none  Recent physical activity:  ADL's  Progress Towards Goal(s):  In progress.  Handouts given during visit include:  none   Nutritional Diagnosis:  NI-2.1 Inadequate intake As related to food intolerance, nausea, and taste aversions.  As evidenced by patient requiring enteral nutrition support to meet nutrient needs.    Intervention:  Nutrition counseling provided.  Dietitian explained to the pt the importance of keeping up care with her surgeon Goals: -Talk with your doctor Teaching Method Utilized:  Visual Auditory Hands on  Barriers to learning/adherence to lifestyle change: nausea and inability to meet nutrition needs orally  Demonstrated degree of understanding via:  Teach Back   Monitoring/Evaluation:  Dietary intake, exercise, and body weight. Follow up in 2 weeks.

## 2017-02-24 ENCOUNTER — Encounter (HOSPITAL_COMMUNITY): Payer: Self-pay

## 2017-02-24 ENCOUNTER — Ambulatory Visit (HOSPITAL_COMMUNITY): Payer: BLUE CROSS/BLUE SHIELD | Admitting: Certified Registered Nurse Anesthetist

## 2017-02-24 ENCOUNTER — Inpatient Hospital Stay (HOSPITAL_COMMUNITY)
Admission: AD | Admit: 2017-02-24 | Discharge: 2017-02-27 | DRG: 392 | Disposition: A | Discharge status: Home / Self Care | Payer: BLUE CROSS/BLUE SHIELD | Source: Ambulatory Visit | Attending: Surgery | Admitting: Surgery

## 2017-02-24 ENCOUNTER — Other Ambulatory Visit: Payer: Self-pay

## 2017-02-24 ENCOUNTER — Encounter (HOSPITAL_COMMUNITY)
Admission: AD | Disposition: A | Discharge status: Home / Self Care | Payer: Self-pay | Source: Ambulatory Visit | Attending: General Surgery

## 2017-02-24 ENCOUNTER — Telehealth: Payer: Self-pay | Admitting: Adult Health

## 2017-02-24 DIAGNOSIS — Z79899 Other long term (current) drug therapy: Secondary | ICD-10-CM

## 2017-02-24 DIAGNOSIS — Z823 Family history of stroke: Secondary | ICD-10-CM | POA: Diagnosis not present

## 2017-02-24 DIAGNOSIS — G4733 Obstructive sleep apnea (adult) (pediatric): Secondary | ICD-10-CM | POA: Diagnosis present

## 2017-02-24 DIAGNOSIS — Z9884 Bariatric surgery status: Secondary | ICD-10-CM

## 2017-02-24 DIAGNOSIS — Z6828 Body mass index (BMI) 28.0-28.9, adult: Secondary | ICD-10-CM

## 2017-02-24 DIAGNOSIS — E441 Mild protein-calorie malnutrition: Secondary | ICD-10-CM | POA: Diagnosis present

## 2017-02-24 DIAGNOSIS — R7989 Other specified abnormal findings of blood chemistry: Secondary | ICD-10-CM | POA: Diagnosis present

## 2017-02-24 DIAGNOSIS — F419 Anxiety disorder, unspecified: Secondary | ICD-10-CM | POA: Diagnosis present

## 2017-02-24 DIAGNOSIS — E860 Dehydration: Secondary | ICD-10-CM | POA: Diagnosis present

## 2017-02-24 DIAGNOSIS — Z888 Allergy status to other drugs, medicaments and biological substances status: Secondary | ICD-10-CM

## 2017-02-24 DIAGNOSIS — R109 Unspecified abdominal pain: Secondary | ICD-10-CM | POA: Diagnosis present

## 2017-02-24 DIAGNOSIS — Z79891 Long term (current) use of opiate analgesic: Secondary | ICD-10-CM | POA: Diagnosis not present

## 2017-02-24 DIAGNOSIS — G43019 Migraine without aura, intractable, without status migrainosus: Secondary | ICD-10-CM

## 2017-02-24 DIAGNOSIS — Z87440 Personal history of urinary (tract) infections: Secondary | ICD-10-CM

## 2017-02-24 DIAGNOSIS — Z833 Family history of diabetes mellitus: Secondary | ICD-10-CM | POA: Diagnosis not present

## 2017-02-24 DIAGNOSIS — F329 Major depressive disorder, single episode, unspecified: Secondary | ICD-10-CM | POA: Diagnosis present

## 2017-02-24 DIAGNOSIS — Z882 Allergy status to sulfonamides status: Secondary | ICD-10-CM

## 2017-02-24 DIAGNOSIS — G932 Benign intracranial hypertension: Secondary | ICD-10-CM | POA: Diagnosis present

## 2017-02-24 DIAGNOSIS — L659 Nonscarring hair loss, unspecified: Secondary | ICD-10-CM | POA: Diagnosis present

## 2017-02-24 DIAGNOSIS — G43709 Chronic migraine without aura, not intractable, without status migrainosus: Secondary | ICD-10-CM | POA: Diagnosis present

## 2017-02-24 DIAGNOSIS — Z931 Gastrostomy status: Secondary | ICD-10-CM

## 2017-02-24 DIAGNOSIS — R1013 Epigastric pain: Secondary | ICD-10-CM | POA: Diagnosis present

## 2017-02-24 DIAGNOSIS — Z9049 Acquired absence of other specified parts of digestive tract: Secondary | ICD-10-CM

## 2017-02-24 HISTORY — PX: ESOPHAGOGASTRODUODENOSCOPY: SHX5428

## 2017-02-24 SURGERY — EGD (ESOPHAGOGASTRODUODENOSCOPY)
Anesthesia: Monitor Anesthesia Care

## 2017-02-24 MED ORDER — LIDOCAINE 2% (20 MG/ML) 5 ML SYRINGE
INTRAMUSCULAR | Status: DC | PRN
  Administered 2017-02-24: 100 mg via INTRAVENOUS

## 2017-02-24 MED ORDER — TOPIRAMATE 25 MG PO TABS
75.0000 mg | ORAL_TABLET | Freq: Every day | ORAL | Status: DC
  Administered 2017-02-25 – 2017-02-27 (×3): 75 mg via ORAL
  Filled 2017-02-24 (×3): qty 3

## 2017-02-24 MED ORDER — POLYETHYLENE GLYCOL 3350 17 G PO PACK: 17.0000 g | PACK | Freq: Every day | ORAL | Status: DC | PRN

## 2017-02-24 MED ORDER — LACTATED RINGERS IV SOLN
INTRAVENOUS | Status: DC
  Administered 2017-02-24 – 2017-02-27 (×7): via INTRAVENOUS

## 2017-02-24 MED ORDER — AMITRIPTYLINE HCL 10 MG PO TABS
20.0000 mg | ORAL_TABLET | Freq: Every day | ORAL | Status: DC
Start: 2017-02-24 — End: 2017-02-27
  Administered 2017-02-24 – 2017-02-26 (×3): 20 mg via ORAL
  Filled 2017-02-24 (×3): qty 2

## 2017-02-24 MED ORDER — PREMIER PROTEIN SHAKE
2.0000 [oz_av] | Freq: Four times a day (QID) | ORAL | Status: DC
  Administered 2017-02-24 – 2017-02-25 (×3): 2 [oz_av] via ORAL

## 2017-02-24 MED ORDER — PROPOFOL 500 MG/50ML IV EMUL
INTRAVENOUS | Status: DC | PRN
  Administered 2017-02-24: 200 ug/kg/min via INTRAVENOUS

## 2017-02-24 MED ORDER — ELETRIPTAN HYDROBROMIDE 40 MG PO TABS
40.0000 mg | ORAL_TABLET | ORAL | Status: DC | PRN
  Filled 2017-02-24: qty 1

## 2017-02-24 MED ORDER — DIPHENHYDRAMINE HCL 12.5 MG/5ML PO ELIX: 12.5000 mg | ORAL_SOLUTION | Freq: Four times a day (QID) | ORAL | Status: DC | PRN

## 2017-02-24 MED ORDER — GLYCOPYRROLATE 0.2 MG/ML IV SOSY
PREFILLED_SYRINGE | INTRAVENOUS | Status: DC | PRN
  Administered 2017-02-24: .1 mg via INTRAVENOUS

## 2017-02-24 MED ORDER — MORPHINE SULFATE (PF) 2 MG/ML IV SOLN: 1.0000 mg | INTRAVENOUS | Status: DC | PRN

## 2017-02-24 MED ORDER — ENOXAPARIN SODIUM 40 MG/0.4ML ~~LOC~~ SOLN
40.0000 mg | SUBCUTANEOUS | Status: DC
  Administered 2017-02-25 – 2017-02-26 (×2): 40 mg via SUBCUTANEOUS
  Filled 2017-02-24 (×3): qty 0.4

## 2017-02-24 MED ORDER — PANTOPRAZOLE SODIUM 40 MG PO PACK
40.0000 mg | PACK | Freq: Every day | ORAL | Status: DC
  Administered 2017-02-24 – 2017-02-27 (×4): 40 mg via ORAL
  Filled 2017-02-24 (×4): qty 20

## 2017-02-24 MED ORDER — DESVENLAFAXINE SUCCINATE ER 25 MG PO TB24: 25.0000 mg | ORAL_TABLET | Freq: Every day | ORAL | Status: DC

## 2017-02-24 MED ORDER — DIPHENHYDRAMINE HCL 50 MG/ML IJ SOLN: 12.5000 mg | Freq: Four times a day (QID) | INTRAMUSCULAR | Status: DC | PRN

## 2017-02-24 MED ORDER — SIMETHICONE 80 MG PO CHEW: 80.0000 mg | CHEWABLE_TABLET | Freq: Four times a day (QID) | ORAL | Status: DC | PRN

## 2017-02-24 MED ORDER — METHOCARBAMOL 500 MG PO TABS: 500.0000 mg | ORAL_TABLET | Freq: Four times a day (QID) | ORAL | Status: DC | PRN

## 2017-02-24 MED ORDER — VENLAFAXINE HCL ER 37.5 MG PO CP24
37.5000 mg | ORAL_CAPSULE | Freq: Every day | ORAL | Status: DC
  Administered 2017-02-25 – 2017-02-27 (×3): 37.5 mg via ORAL
  Filled 2017-02-24 (×3): qty 1

## 2017-02-24 MED ORDER — TOPIRAMATE 25 MG PO TABS: 75.0000 mg | ORAL_TABLET | Freq: Every day | ORAL | Status: DC

## 2017-02-24 MED ORDER — CLONAZEPAM 0.5 MG PO TABS
0.5000 mg | ORAL_TABLET | Freq: Three times a day (TID) | ORAL | Status: DC | PRN
  Administered 2017-02-24: 0.5 mg via ORAL
  Filled 2017-02-24 (×2): qty 1

## 2017-02-24 MED ORDER — OXYCODONE HCL 5 MG PO TABS
2.5000 mg | ORAL_TABLET | ORAL | Status: DC | PRN
  Administered 2017-02-24 – 2017-02-26 (×7): 5 mg via ORAL
  Filled 2017-02-24 (×7): qty 1

## 2017-02-24 MED ORDER — NORELGESTROMIN-ETH ESTRADIOL 150-35 MCG/24HR TD PTWK: 1.0000 | MEDICATED_PATCH | TRANSDERMAL | Status: DC

## 2017-02-24 MED ORDER — METOCLOPRAMIDE HCL 5 MG PO TABS
5.0000 mg | ORAL_TABLET | Freq: Three times a day (TID) | ORAL | Status: DC
  Administered 2017-02-24 – 2017-02-27 (×11): 5 mg via ORAL
  Filled 2017-02-24 (×11): qty 1

## 2017-02-24 MED ORDER — PROPOFOL 10 MG/ML IV BOLUS
INTRAVENOUS | Status: DC | PRN
  Administered 2017-02-24: 60 mg via INTRAVENOUS

## 2017-02-24 MED ORDER — ONDANSETRON HCL 4 MG PO TABS
4.0000 mg | ORAL_TABLET | Freq: Three times a day (TID) | ORAL | Status: DC | PRN
  Administered 2017-02-25: 4 mg via ORAL
  Filled 2017-02-24: qty 1

## 2017-02-24 MED ORDER — PROMETHAZINE HCL 25 MG/ML IJ SOLN: 12.5000 mg | Freq: Four times a day (QID) | INTRAMUSCULAR | Status: DC | PRN

## 2017-02-24 MED ORDER — SUCRALFATE 1 GM/10ML PO SUSP
1.0000 g | Freq: Three times a day (TID) | ORAL | Status: DC
  Administered 2017-02-24 – 2017-02-27 (×11): 1 g via ORAL
  Filled 2017-02-24 (×11): qty 10

## 2017-02-24 MED ORDER — ONDANSETRON HCL 4 MG/2ML IJ SOLN
INTRAMUSCULAR | Status: DC | PRN
  Administered 2017-02-24: 4 mg via INTRAVENOUS

## 2017-02-24 MED ORDER — AMPHETAMINE-DEXTROAMPHETAMINE 10 MG PO TABS
10.0000 mg | ORAL_TABLET | Freq: Two times a day (BID) | ORAL | Status: DC
  Administered 2017-02-25 – 2017-02-27 (×5): 10 mg via ORAL
  Filled 2017-02-24 (×5): qty 1

## 2017-02-24 NOTE — Anesthesia Postprocedure Evaluation (Signed)
Anesthesia Post Note  Patient: Margaret Fletcher  Procedure(s) Performed: ESOPHAGOGASTRODUODENOSCOPY (EGD) (N/A )     Patient location during evaluation: PACU Anesthesia Type: MAC Level of consciousness: awake and alert Pain management: pain level controlled Vital Signs Assessment: post-procedure vital signs reviewed and stable Respiratory status: spontaneous breathing, nonlabored ventilation, respiratory function stable and patient connected to nasal cannula oxygen Cardiovascular status: stable and blood pressure returned to baseline Postop Assessment: no apparent nausea or vomiting Anesthetic complications: no    Last Vitals:  Vitals:   02/24/17 1407 02/24/17 1532  BP: 124/83 125/84  Pulse: 62   Resp: 15 18  Temp: 36.9 C 36.9 C  SpO2:  99%    Last Pain:  Vitals:   02/24/17 1532  TempSrc: Oral  PainSc:                  Hillis Mcphatter S

## 2017-02-24 NOTE — Op Note (Signed)
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 Redmond Pulling MD, MD, Kingsley Plan, RN, Elna Breslow, RN, Heide Scales, CRNA Referring MD:              Medicines:                Monitored Anesthesia Care Complications:            No immediate complications. Estimated blood loss:                            None. Estimated Blood Loss:     Estimated blood loss: none. Procedure:                Pre-Anesthesia Assessment:                           - Prior to the procedure, a History and Physical                            was performed, and patient medications and                            allergies were reviewed. The patient is competent.                            The risks and benefits of the procedure and the                            sedation options and risks were discussed with the                            patient. All questions were answered and informed                            consent was obtained. Patient identification and                            proposed procedure were verified by the physician,  the nurse, the anesthetist and the technician in                            the endoscopy suite. Mental Status Examination:                            normal. Airway Examination: normal oropharyngeal                            airway and neck mobility. Respiratory Examination:                            clear to auscultation. CV Examination: normal.   Prophylactic Antibiotics: The patient does not                            require prophylactic antibiotics. Prior                            Anticoagulants: The patient has taken no previous                            anticoagulant or antiplatelet agents. ASA Grade                            Assessment: II - A patient with mild systemic                            disease. After reviewing the risks and benefits,                            the patient was deemed in satisfactory condition to                            undergo the procedure. The anesthesia plan was to                            use monitored anesthesia care (MAC). Immediately                            prior to administration of medications, the patient                            was re-assessed for adequacy to receive sedatives.                            The heart rate, respiratory rate, oxygen                            saturations, blood pressure, adequacy of pulmonary                            ventilation, and response to care were monitored  throughout the procedure. The physical status of                            the patient was re-assessed after the procedure.                           After obtaining informed consent, the endoscope was                            passed under direct vision. Throughout the                            procedure, the patient's blood pressure, pulse, and                            oxygen saturations were monitored continuously. The                            EG-[id number] ([id number]) scope was introduced through the                            mouth, and advanced to the efferent jejunal loop.                            The upper GI endoscopy was accomplished with ease.                            The patient tolerated the procedure well. Scope In: Scope Out: Findings:      The esophagus was normal. normal appearing esophageal mucosa. Zline at       43 cm. no  esophagitis. z line appeared regular. normal appearing gastric       pouch. anastomosis at 47 cm. anastomosis >3cm. no exposed staples. just       pass the anastomosis is roux limb which appears dilated on one end (the       transected blind candy cane) and the efferent or roux limb. there is       normal appearing mucosa. no gastritis in pouch. Impression:               - Normal esophagus.                           - No specimens collected.                           - normal appearing roux en ygastric bypass anatomy Moderate Sedation:      Moderate (conscious) sedation was personally administered by an       anesthesia professional. The following parameters were monitored: oxygen       saturation, heart rate, blood pressure, respiratory rate, EKG, adequacy       of pulmonary ventilation, and response to care. Total physician       intraservice time was 10 minutes. Recommendation:           - Admit the patient to hospital ward for  observation.                           - Resume previous diet.                           -unclear etiology of pt's ongoing epigastric pain                           - Continue present medications.                           - Admit the patient to hospital ward for ongoing                            care.                           - The findings and recommendations were discussed                            with the patient's family.                           - The findings and recommendations were discussed                            with the patient's family.                           - Patient has a contact number available for                            emergencies. The signs and symptoms of potential                            delayed complications were discussed with the                            patient. Return to normal activities tomorrow.                            Written discharge instructions were provided to the                             patient.                           - Patient has a contact number available for                            emergencies. Procedure Code(s):        --- Professional ---                           [id number], Esophagogastroduodenoscopy, flexible,  transoral; diagnostic, including collection of                            specimen(s) by brushing or washing, when performed                            (separate procedure) Diagnosis Code(s):        --- Professional ---                           R10.13, Epigastric pain CPT copyright 2016 [employer]. All rights reserved. The codes documented in this report are preliminary and upon coder review may  be revised to meet current compliance requirements. Greer Pickerel, MD [name] MD, MD 02/24/2017 4:03:29 PM This report has been signed electronically. Number of Addenda: 0

## 2017-02-24 NOTE — Telephone Encounter (Signed)
Creatinine level is elevated.  We will complete an MRI without contrast.

## 2017-02-24 NOTE — Transfer of Care (Signed)
Immediate Anesthesia Transfer of Care Note  Patient: DONN WILMOT  Procedure(s) Performed: Procedure(s): ESOPHAGOGASTRODUODENOSCOPY (EGD) (N/A)  Patient Location: PACU  Anesthesia Type:MAC  Level of Consciousness: Patient easily awoken, sedated, comfortable, cooperative, following commands, responds to stimulation.   Airway & Oxygen Therapy: Patient spontaneously breathing, ventilating well, oxygen via simple oxygen mask.  Post-op Assessment: Report given to PACU RN, vital signs reviewed and stable, moving all extremities.   Post vital signs: Reviewed and stable.  Complications: No apparent anesthesia complications Last Vitals:  Vitals:   02/24/17 1407  BP: 124/83  Pulse: 62  Resp: 15  Temp: 36.9 C    Last Pain:  Vitals:   02/24/17 1407  TempSrc: Oral  PainSc: 7       Patients Stated Pain Goal: 2 (50/27/74 1287)  Complications: No apparent anesthesia complications

## 2017-02-24 NOTE — Anesthesia Preprocedure Evaluation (Signed)
Anesthesia Evaluation  Patient identified by MRN, date of birth, ID band Patient awake    Reviewed: Allergy & Precautions, NPO status , Patient's Chart, lab work & pertinent test results  Airway Mallampati: II  TM Distance: >3 FB Neck ROM: Full    Dental no notable dental hx.    Pulmonary neg pulmonary ROS,    Pulmonary exam normal breath sounds clear to auscultation       Cardiovascular negative cardio ROS Normal cardiovascular exam Rhythm:Regular Rate:Normal     Neuro/Psych negative neurological ROS  negative psych ROS   GI/Hepatic negative GI ROS, Neg liver ROS,   Endo/Other  negative endocrine ROS  Renal/GU negative Renal ROS  negative genitourinary   Musculoskeletal negative musculoskeletal ROS (+)   Abdominal   Peds negative pediatric ROS (+)  Hematology negative hematology ROS (+)   Anesthesia Other Findings   Reproductive/Obstetrics negative OB ROS                             Anesthesia Physical Anesthesia Plan  ASA: II  Anesthesia Plan: MAC   Post-op Pain Management:    Induction: Intravenous  PONV Risk Score and Plan: 0  Airway Management Planned: Nasal Cannula  Additional Equipment:   Intra-op Plan:   Post-operative Plan:   Informed Consent: I have reviewed the patients History and Physical, chart, labs and discussed the procedure including the risks, benefits and alternatives for the proposed anesthesia with the patient or authorized representative who has indicated his/her understanding and acceptance.   Dental advisory given  Plan Discussed with: CRNA and Surgeon  Anesthesia Plan Comments:         Anesthesia Quick Evaluation

## 2017-02-24 NOTE — H&P (Signed)
Margaret Fletcher is an 46 y.o. female.   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  She states that she has had a flare of her epigastric discomfort which she describes as a raw and achy sensation over the past few days.  This has limited her intake.  She is averaging about 32 ounces of a coffee protein shake.  She did have a small amount of emesis last evening.  She denies any fever or chills.  This is similar to the discomfort that she has had multiple times in the past but tends to flare several times a year.  Last bowel movement was yesterday.  She has not been able to tolerate any enteral nutrition through her G-tube.  I was alerted that she was having abdominal issues by the dietitian and I spoke with the patient yesterday on the phone.  We had sent her to the hydration clinic late last week but she continued to have abdominal issues over the weekend.  I recommended upper endoscopy since she said it was mainly in the epigastric area to rule out an ulcer as well as possible admission after upper endoscopy  Her immediate postoperative course was complicated by failure to tolerate oral nutrition without abdominal discomfort.  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  It had little success.  She has had difficulty tolerating both oral and enteral nutrition despite multiple CT scans upper endoscopy and upper GIs.  Past Medical History:  Diagnosis Date  . Alopecia   . Anxiety   . Autoimmune disease ([place])    "Alopecia"  . Bacterial vaginosis   . Cyst of spleen   . Depression   . Frequent UTI   . Hypertrophy of breast    . IIH (idiopathic intracranial hypertension) 2017   "related to headaches"  . Migraine headache    denies, ruled out - Propranolol.Using for headaches    Past Surgical History:  Procedure Laterality Date  . APPENDECTOMY    . BREAST REDUCTION SURGERY Bilateral 03/26/2015   Procedure: BILATERAL BREAST REDUCTION  ;  Surgeon: Youlanda Roys, MD;  Location: [facility];  Service: Plastics;  Laterality: Bilateral;  . BREATH TEK H PYLORI N/A 05/21/2014   Procedure: BREATH TEK H PYLORI;  Surgeon: Greer Pickerel, MD;  Location: [facility];  Service: General;  Laterality: N/A;  . CESAREAN SECTION    . CESAREAN SECTION    . CHOLECYSTECTOMY    . DILITATION & CURRETTAGE/HYSTROSCOPY WITH NOVASURE ABLATION N/A 07/19/2012   Procedure: HYSTEROSCOPY WITH NOVASURE ABLATION;  Surgeon: Farrel Gobble. Harrington Challenger, MD;  Location: [facility];  Service: Gynecology;  Laterality: N/A;  . ENDOMETRIAL ABLATION    . ESOPHAGOGASTRODUODENOSCOPY N/A 08/16/2015   Procedure: UPPER ESOPHAGOGASTRODUODENOSCOPY (EGD);  Surgeon: Greer Pickerel, MD;  Location: [facility];  Service: General;  Laterality: N/A;  . ESOPHAGOGASTRODUODENOSCOPY N/A 02/21/2016   Procedure: ESOPHAGOGASTRODUODENOSCOPY (EGD);  Surgeon: Greer Pickerel, MD;  Location: [facility];  Service: General;  Laterality: N/A;  . IR GENERIC HISTORICAL  10/04/2015   IR [place] Vivianne Master 10/04/2015 Markus Daft, MD [facility]  . IR GENERIC HISTORICAL  01/31/2016  IR [place] TUBE PERCUT W/FLUORO 01/31/2016 Corrie Mckusick, DO [facility]  . IR GENERIC HISTORICAL  04/06/2016   IR [place] TUBE PERCUT W/FLUORO 04/06/2016 Arne Cleveland, MD [facility]  . IR GENERIC HISTORICAL  05/14/2016   IR GASTR[place] TUBE PERC CHG W/O IMG GUIDE 05/14/2016 Sandi Mariscal, MD [facility]  . LAPAROSCOPIC GASTROSTOMY N/A 09/30/2015   Procedure: LAPAROSCOPIC GASTROSTOMY TUBE PLACEMENT;  Surgeon: Greer Pickerel, MD;  Location: [facility];  Service: General;   Laterality: N/A;  . LAPAROSCOPIC ROUX-EN-Y GASTRIC BYPASS WITH HIATAL HERNIA REPAIR N/A 07/08/2015   Procedure: LAPAROSCOPIC ROUX-EN-Y GASTRIC BYPASS WITH HIATAL HERNIA REPAIR WITH UPPER ENDOSCOPY;  Surgeon: Greer Pickerel, MD;  Location: [facility];  Service: General;  Laterality: N/A;  . LAPAROSCOPIC SMALL BOWEL RESECTION N/A 09/30/2015   Procedure: LAPAROSCOPIC REVISION OF ROUX LIMB;  Surgeon: Greer Pickerel, MD;  Location: [facility];  Service: General;  Laterality: N/A;  . LAPAROSCOPY N/A 09/30/2015   Procedure: LAPAROSCOPY DIAGNOSTIC;  Surgeon: Greer Pickerel, MD;  Location: [facility];  Service: General;  Laterality: N/A;  . TUBAL LIGATION    . WISDOM TOOTH EXTRACTION      Family History  Problem Relation Age of Onset  . Cancer Mother   . Stroke Father   . Diabetes Brother   . Seizures Son   . Migraines Neg Hx    Social History:  reports that  has never smoked. she has never used smokeless tobacco. She reports that she drinks alcohol. She reports that she does not use drugs.  Allergies:  Allergies  Allergen Reactions  . Sulfa Antibiotics Anaphylaxis  . Zosyn [Piperacillin Sod-Tazobactam So] Rash    Has patient had a PCN reaction causing immediate rash, facial/tongue/throat swelling, SOB or lightheadedness with hypotension: No Has patient had a PCN reaction causing severe rash involving mucus membranes or skin necrosis: No Has patient had a PCN reaction that required hospitalization: Unknown--inpatient when reaction occurred Has patient had a PCN reaction occurring within the last 10 years: Yes If all of the above answers are "NO", then may proceed with Cephalosporin use.     Medications Prior to Admission  Medication Sig Dispense Refill  . amitriptyline (ELAVIL) 10 MG tablet Take 2 tablets (20 mg total) by mouth at bedtime. 60 tablet 11  . amphetamine-dextroamphetamine (ADDERALL) 10 MG tablet Take 1 tablet (10 mg total) by mouth 2 (two) times daily. 60 tablet 0  . CALCIUM-VITAMIN D PO Take 1  tablet by mouth 2 (two) times daily. chewable    . cephALEXin (KEFLEX) 500 MG capsule Take 1 capsule (500 mg total) by mouth 4 (four) times daily. (Patient taking differently: Take 500 mg by mouth 3 times daily with meals, bedtime and 2 AM. ) 28 capsule 0  . clonazePAM (KLONOPIN) 0.5 MG tablet Take 1 tablet (0.5 mg total) by mouth 3 (three) times daily as needed for anxiety. 90 tablet 0  . Cyanocobalamin (VITAMIN B-12 SL) Place 1 drop under the tongue daily. 1 dropperful    . Desvenlafaxine Succinate ER (PRISTIQ) 25 MG TB24 Take 25 mg by mouth daily. 30 tablet 0  . docusate sodium (COLACE) 100 MG capsule Take 100 mg by mouth daily.    Marland Kitchen eletriptan (RELPAX) 40 MG tablet Take 1 tablet (40 mg total) by mouth every 2 (two) hours as needed for migraine or headache. Do not use >2 doses/24 hours 10 tablet 11  . hydrocortisone (ANUSOL-HC) 25 MG suppository Place 1 suppository (25 mg total) rectally 2 (two) times daily  as needed for hemorrhoids. - after a bowel movement if possible 24 suppository 1  . lactulose (CHRONULAC) 10 GM/15ML solution Take 30 g by mouth daily as needed for moderate constipation.   0  . linaclotide (LINZESS) 145 MCG CAPS capsule Take 1 capsule (145 mcg total) by mouth daily before breakfast. As needed for chronic constipation 30 capsule 5  . metoCLOPramide (REGLAN) 5 MG tablet Take 1 tablet (5 mg total) by mouth 4 (four) times daily -  before meals and at bedtime. 120 tablet 1  . Multiple Vitamins-Minerals (BARIATRIC MULTIVITAMINS/IRON PO) Take 1 tablet by mouth 3 (three) times daily.    . Nutritional Supplements (FEEDING SUPPLEMENT, VITAL HIGH PROTEIN,) LIQD liquid Place 1,000 mLs into feeding tube daily. 30ml/hr for 8hrs a day as needed for poor oral intake; 300 ml free water q4hr (Patient taking differently: Place 1,000 mLs into feeding tube daily. 57ml/hr for 8hrs a day as needed for poor oral intake; 300 ml free water q4hr) 1000 mL 8  . ondansetron (ZOFRAN-ODT) 4 MG disintegrating  tablet Take 1 tablet (4 mg total) by mouth every 6 (six) hours as needed for nausea. 30 tablet 0  . oxyCODONE (OXY IR/ROXICODONE) 5 MG immediate release tablet Take 1-2 tablets (5-10 mg total) by mouth every 4 (four) hours as needed for moderate pain. (Patient taking differently: Take 2.5-10 mg by mouth every 4 (four) hours as needed for moderate pain. ) 60 tablet 0  . pantoprazole (PROTONIX) 40 MG tablet Take 40 mg by mouth daily.    . polyethylene glycol (MIRALAX / GLYCOLAX) packet Take 17 g by mouth daily as needed (for constipation.).     Marland Kitchen simethicone (MYLICON) 80 MG chewable tablet Chew 80 mg by mouth every 6 (six) hours as needed for flatulence.    . topiramate (TOPAMAX) 50 MG tablet Take 1.5 tablets (75 mg total) by mouth daily. 45 tablet 11  . XULANE 150-35 MCG/24HR transdermal patch Place 1 patch onto the skin every Sunday.   11  . ipratropium (ATROVENT) 0.03 % nasal spray Place 2 sprays into the nose 4 (four) times daily. (Patient not taking: Reported on 02/23/2017) 30 mL 1  . mupirocin ointment (BACTROBAN) 2 % Apply 1 application topically 3 (three) times daily. (Patient not taking: Reported on 02/23/2017) 22 g 1  . ondansetron (ZOFRAN) 4 MG tablet Take 1 tablet (4 mg total) by mouth every 6 (six) hours. (Patient taking differently: Take 4 mg every 8 (eight) hours as needed by mouth for nausea or vomiting. ) 12 tablet 0    No results found for this or any previous visit (from the past 48 hour(s)). No results found.  Review of Systems  Constitutional: Negative for weight loss.  HENT: Negative for nosebleeds.   Eyes: Negative for blurred vision.  Respiratory: Negative for shortness of breath.   Cardiovascular: Negative for chest pain, palpitations, orthopnea and PND.       Denies DOE  Genitourinary: Negative for dysuria and hematuria.  Musculoskeletal: Negative.   Skin: Negative for itching and rash.  Neurological: Negative for dizziness, focal weakness, seizures, loss of  consciousness and headaches.       Denies TIAs, amaurosis fugax  Endo/Heme/Allergies: Does not bruise/bleed easily.  Psychiatric/Behavioral: The patient is not nervous/anxious.     Blood pressure 124/83, pulse 62, temperature 98.4 F (36.9 C), temperature source Oral, resp. rate 15, height 5\' 8"  (1.727 m), weight 83.9 kg (185 lb). Physical Exam  Vitals reviewed. Constitutional: She is oriented to person,  place, and time. She appears well-developed and well-nourished. No distress.  HENT:  Head: Normocephalic and atraumatic.  Right Ear: External ear normal.  Left Ear: External ear normal.  Eyes: Conjunctivae are normal. No scleral icterus.  Neck: Normal range of motion. Neck supple. No tracheal deviation present. No thyromegaly present.  Cardiovascular: Normal rate and normal heart sounds.  Respiratory: Effort normal and breath sounds normal. No stridor. No respiratory distress. She has no wheezes.  GI: Soft. Normal appearance. There is no rigidity, no rebound and no guarding.  G tube LUQ - intact and secure; ND, essentially NT, subtle mild epigastric TTP  Musculoskeletal: She exhibits no edema or tenderness.  Lymphadenopathy:    She has no cervical adenopathy.  Neurological: She is alert and oriented to person, place, and time. She exhibits normal muscle tone.  Skin: Skin is warm and dry. No rash noted. She is not diaphoretic. No erythema. No pallor.  Psychiatric: Her behavior is normal. Judgment and thought content normal.  Slightly anxious; flat     Assessment/Plan Epigastric pain Mild protein calorie malnutrition Intolerance to oral and enteral nutrition History of laparoscopic Roux-en-Y gastric bypass in May 2017 Idiopathic intracranial hypertension  She had a prealbumin checked while she was at the hydration clinic last week.  Her prealbumin was 29 however her albumin was 3 which is a little bit lower for her. I am still at a loss of why she has a chronic sensation of  achiness in her upper abdomen that causes issues with both oral as well as enteral nutrition  Because she has had a flare of her abdominal discomfort I recommended doing an upper endoscopy to evaluate her esophagus, pouch and anastomosis.  She has described heartburn recently.  She had been instructed to resume her Protonix and take it crushed along with Carafate.  We will plan upper endoscopy this afternoon.  I discussed risk and benefits of the procedure including but not limited to bleeding, perforation, need for additional procedures, failure to diagnose.  We discussed the use of sedation.  We will plan on admitting her afterwards for bowel rest with potential placement of a PICC line and TPN  Leighton Ruff. Redmond Pulling, MD, FACS General, Bariatric, & Minimally Invasive Surgery [facility], [place]   Greer Pickerel, MD 02/24/2017, 2:48 PM

## 2017-02-24 NOTE — Anesthesia Procedure Notes (Signed)
Procedure Name: MAC Date/Time: 02/24/2017 3:21 PM Performed by: Deliah Boston, CRNA Pre-anesthesia Checklist: Patient identified, Emergency Drugs available, Suction available and Patient being monitored Patient Re-evaluated:Patient Re-evaluated prior to induction Oxygen Delivery Method: Simple face mask Preoxygenation: Pre-oxygenation with 100% oxygen Placement Confirmation: positive ETCO2 and breath sounds checked- equal and bilateral

## 2017-02-25 LAB — COMPREHENSIVE METABOLIC PANEL
ALBUMIN: 2.7 g/dL — AB (ref 3.5–5.0)
ALK PHOS: 37 U/L — AB (ref 38–126)
ALT: 14 U/L (ref 14–54)
ANION GAP: 2 — AB (ref 5–15)
AST: 17 U/L (ref 15–41)
BILIRUBIN TOTAL: 0.6 mg/dL (ref 0.3–1.2)
BUN: 21 mg/dL — AB (ref 6–20)
CALCIUM: 8 mg/dL — AB (ref 8.9–10.3)
CO2: 23 mmol/L (ref 22–32)
Chloride: 111 mmol/L (ref 101–111)
Creatinine, Ser: 1.25 mg/dL — ABNORMAL HIGH (ref 0.44–1.00)
GFR calc Af Amer: 58 mL/min — ABNORMAL LOW (ref >60)
GFR calc non Af Amer: 50 mL/min — ABNORMAL LOW (ref >60)
GLUCOSE: 84 mg/dL (ref 65–99)
Potassium: 3.9 mmol/L (ref 3.5–5.1)
SODIUM: 136 mmol/L (ref 135–145)
TOTAL PROTEIN: 5.3 g/dL — AB (ref 6.5–8.1)

## 2017-02-25 LAB — HIV ANTIBODY (ROUTINE TESTING W REFLEX): HIV Screen 4th Generation wRfx: NONREACTIVE

## 2017-02-25 MED ORDER — PREMIER PROTEIN SHAKE
11.0000 [oz_av] | Freq: Two times a day (BID) | ORAL | Status: DC
  Administered 2017-02-25 – 2017-02-26 (×2): 11 [oz_av] via ORAL

## 2017-02-25 MED FILL — XULANE PATCH: 150-35 | 21 days supply | Qty: 3 | Fill #1

## 2017-02-25 MED FILL — ELETRIPTAN HBR 40 MG TABLET: 40 | 30 days supply | Qty: 10 | Fill #1

## 2017-02-25 NOTE — Progress Notes (Signed)
[facility] Office:  [phone] General Surgery Progress Note   LOS: 1 day  POD -  1 Day Post-Op  Chief Complaint: Nausea, abdominal pain  Assessment and Plan: 1.  Nausea, poor po intake, mild dehydration -   ESOPHAGOGASTRODUODENOSCOPY (EGD) Redmond Pulling - 02/24/2017 - normal gastric bypass anatomy             Plan:  On bariatric diet to see what she can tolerate, IVF, repeat labs and exam in AM.              On Reglan, Protonix, carafate  2.  S/P RYGB - 07/08/2015 - Dr. Redmond Pulling   Gastrostomy tube, resection of small bowel at "candy cane" - 09/30/2015 3. Chronic migraine without aura or status migrainosus 4.  IIH (idiopathic intracranial hypertension)         Dr. Jaynee Eagles has ordered MRI - to be done today 5.  Mild obstructive sleep apnea 6.  Elevated creatinine  Creatinine - 1.25 - 02/25/2017 6.  DVT prophylaxis - Lovenox   Active Problems:   Abdominal pain  Subjective:  She went through a tearful explanation of her abdominal symptoms.  When she can't swallow things orally, she can't tolerate her gastrostomy tube.   Her main diet is a 32 oz coffee brew that she can tolerate.  She expressed a fear of a missed cancer, but I tried to reassure her that there is no evidence of that.  Her husband, Cornell, is at the bedside.  Objective:   Vitals:   02/24/17 2214 02/25/17 0600  BP: 116/65 112/63  Pulse: 68 60  Resp: 18 18  Temp: 99.1 F (37.3 C) 98.7 F (37.1 C)  SpO2: 100% 100%     Intake/Output from previous day:  12/19 0701 - 12/20 0700 In: 1977.5 [P.O.:240; I.V.:1737.5] Out: -   Intake/Output this shift:  No intake/output data recorded.   Physical Exam:   General: WN AA F who is alert and oriented.    HEENT: Normal. Pupils equal. .   Lungs: Clear   Abdomen: Soft.  Has BS.  G tube in LUQ.  No peritoneal signs.   Lab Results:   No results for input(s): WBC, HGB, HCT, PLT in the last 72 hours.  BMET   Recent Labs    02/22/17 0823 02/25/17 0441  NA  141 136  K 4.4 3.9  CL 108* 111  CO2 21 23  GLUCOSE 49* 84  BUN 21 21*  CREATININE 1.40* 1.25*  CALCIUM 8.6* 8.0*    PT/INR  No results for input(s): LABPROT, INR in the last 72 hours.  ABG  No results for input(s): PHART, HCO3 in the last 72 hours.  Invalid input(s): PCO2, PO2   Studies/Results:  No results found.   Anti-infectives:   Anti-infectives (From admission, onward)   None      Alphonsa Overall, MD, FACS Pager: [facility] Office: [phone] 02/25/2017

## 2017-02-25 NOTE — Progress Notes (Signed)
Patient alert and oriented.   Provided support and encouragement.  Encouraged  ambulation and tolerated foods and fluids. All questions answered.  Will continue to monitor.

## 2017-02-25 NOTE — Progress Notes (Signed)
Called down to MRI today to find out what time Mrs. Margaret Fletcher was scheduled. She was not on the schedule to have an MRI completed today 02/25/17.

## 2017-02-25 NOTE — Progress Notes (Signed)
Initial Nutrition Assessment  INTERVENTION:   Continue Premier Protein BID, each supplement provides 160 kcal and 30 grams of protein.  Pt's husband to bring pt's specialized protein drink that she makes at home  RD will continue to monitor for diet tolerance and plan  NUTRITION DIAGNOSIS:   Inadequate oral intake related to nausea, vomiting(abdominal pain) as evidenced by per patient/family report.  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  She states that she has had a flare of her epigastric discomfort which she describes as a raw and achy sensation over the past few days.  This has limited her intake.  She is averaging about 32 ounces of a coffee protein shake.  She did have a small amount of emesis last evening.  She denies any fever or chills.  This is similar to the discomfort that she has had multiple times in the past but tends to flare several times a year.  Last bowel movement was yesterday.  She has not been able to tolerate any enteral nutrition through her G-tube.  I was alerted that she was having abdominal issues by the dietitian and I spoke with the patient yesterday on the phone.  We had sent her to the hydration clinic late last week but she continued to have abdominal issues over the weekend.  I recommended upper endoscopy since she said it was mainly in the epigastric area to rule out an ulcer as well as possible admission after upper endoscopy  Pt familiar to RD from previous admissions last year following her gastric bypass surgery in May 2017. Pt has been since followed by RD at  [facility]. Last seen 12/18. At that time pt stated she could not tolerate any PO intakes, was having reflux and abdominal pain.  This RD visited pt today and pt was able to provide detailed dietary recall of PTA: B: toast with jelly and sometimes peanut butter L: Makes a 32 oz protein shake using brewed coffee, eggnog and a whole caramel Premier Protein shake "Picks" at her husband's plate at restaurants D: sometimes makes an additional coffee mixture of brewed coffee, eggnog and some Premier Protein (pt states she does measure this shake, states it is about ~16 oz).  Pt has been giving herself a bolus via her G-tube but was unable to states how much was in each bolus she administers. She uses Premier Protein or Ensure via her G-tube. She could no longer use Vital HP at home d/t insurance coverage issues.  Patient reports she is taking her multivitamin and calcium chewable supplements like recommended. She also takes a B-12 droplet.   Per surgery note, pt may need PICC line placed and TPN initiation.   Today, pt ate a bite of boiled egg, 1/2 of her vanilla pudding and has drank 1/2 of her coffee mixed with chocolate Premier Protein. (So far that is ~140 kcal and ~13g of protein consumed).  Medications: Reglan tablet QID, Protonix suspension daily, Carafate suspension QID, Lactated Ringers infusion @ 125 ml/hr Labs reviewed: GFR: 58   NUTRITION - FOCUSED PHYSICAL EXAM:    Most Recent Value  Orbital Region  No depletion  Upper Arm Region  No depletion  Thoracic and Lumbar Region  Unable to assess  Buccal Region  No depletion  Temple Region  Mild depletion  Clavicle Bone Region  No depletion  Clavicle and Acromion Bone Region  No depletion  Scapular Bone Region  No depletion  Dorsal Hand  No depletion  Patellar Region  No depletion  Anterior Thigh Region  No depletion  Posterior Calf Region  No depletion  Edema (RD Assessment)  Mild  Hair  Unable to assess  Skin  Reviewed  Nails   Reviewed       Diet Order:  Diet bariatric advanced Room service appropriate? Yes; Fluid consistency: Thin  EDUCATION NEEDS:   Education needs have been addressed  Skin:  Skin Assessment: Reviewed RN Assessment  Last BM:  12/18  Height:   Ht Readings from Last 1 Encounters:  02/24/17 5\' 8"  (1.727 m)    Weight:   Wt Readings from Last 1 Encounters:  02/24/17 185 lb (83.9 kg)    Ideal Body Weight:  63.6 kg  BMI:  Body mass index is 28.13 kg/m.  Estimated Nutritional Needs:   Kcal:  1800-2000  Protein:  75-85g  Fluid:  2L/day  Clayton Bibles, MS, RD, LDN [facility] Pager: [phone] After Hours Pager: [phone]

## 2017-02-26 NOTE — Progress Notes (Signed)
Patient sitting in bed smiling, IV access lost.  Patient reports tolerating coffee/protein shake drink and yogurt this am.  We discussed foods tolerated yesterday.  These include yogurt, coffee/protein shake, 1/4 cup Tuna with crackers.  No complaints of nausea or pain at this time although pain medication required this am.  No questions at this time for bariatric nurse coordinator.

## 2017-02-26 NOTE — Progress Notes (Signed)
[facility] Office:  [phone] General Surgery Progress Note   LOS: 2 days  POD -  2 Days Post-Op  Chief Complaint: Nausea, abdominal pain  Assessment and Plan: 1.  Nausea, poor po intake, and mild dehydration -   ESOPHAGOGASTRODUODENOSCOPY (EGD) Redmond Pulling - 02/24/2017 - normal gastric bypass anatomy             Plan:  On bariatric diet to see what she can tolerate and IVF.              On Reglan, Protonix, carafate.   She looks better today.  Plans to go home in AM.  2.  S/P RYGB - 07/08/2015 - Dr. Redmond Pulling   Gastrostomy tube, resection of small bowel at "candy cane" - 09/30/2015 3. Chronic migraine without aura or status migrainosus 4.  IIH (idiopathic intracranial hypertension)          Dr. Jaynee Eagles has ordered MRI of the head.  There was some discussion of this being done during this admission, but it has not been done. 5.  Mild obstructive sleep apnea 6.  Elevated creatinine  Creatinine - 1.25 - 02/25/2017 6.  DVT prophylaxis - Lovenox   Active Problems:   Abdominal pain  Subjective:  Had a much better day today.  Her abdominal pain is better.  She is taking po's better.  We are not using the gastrostomy tube while in the hospital.  She is in the room by herself.  Objective:   Vitals:   02/26/17 0950 02/26/17 1345  BP: (!) 142/96 (!) 141/93  Pulse: 74 70  Resp: 16 16  Temp: 99.1 F (37.3 C) 98.9 F (37.2 C)  SpO2: 100% 98%     Intake/Output from previous day:  12/20 0701 - 12/21 0700 In: 3540 [P.O.:540; I.V.:3000] Out: -   Intake/Output this shift:  Total I/O In: 400 [P.O.:400] Out: -    Physical Exam:   General: WN AA F who is alert and oriented.    HEENT: Normal. Pupils equal. .   Lungs: Clear   Abdomen: Soft.  Has BS.  G tube in LUQ.  Abdomen is benign.   Lab Results:   No results for input(s): WBC, HGB, HCT, PLT in the last 72 hours.  BMET   Recent Labs    02/25/17 0441  NA 136  K 3.9  CL 111  CO2 23  GLUCOSE 84  BUN 21*   CREATININE 1.25*  CALCIUM 8.0*    PT/INR  No results for input(s): LABPROT, INR in the last 72 hours.  ABG  No results for input(s): PHART, HCO3 in the last 72 hours.  Invalid input(s): PCO2, PO2   Studies/Results:  No results found.   Anti-infectives:   Anti-infectives (From admission, onward)   None      Alphonsa Overall, MD, FACS Pager: [facility] Office: [phone] 02/26/2017

## 2017-02-27 MED ORDER — OXYCODONE HCL 5 MG PO TABS: 5.0000 mg | ORAL_TABLET | ORAL | 0 refills | Status: DC | PRN

## 2017-02-27 NOTE — Discharge Summary (Signed)
Physician Discharge Summary  Patient ID: Margaret Fletcher MRN: [id number] DOB/AGE: January 29, 1971 46 y.o.  Admit date: 02/24/2017 Discharge date: 02/27/2017  Admission Diagnoses: abdominal pain  Discharge Diagnoses:  Active Problems:   Abdominal pain   Discharged Condition: good  Hospital Course: Admitted for endoscopy and supportive care  Consults: None  Significant Diagnostic Studies: see epic  Treatments: IV hydration and surgery: upper endoscopy  Discharge Exam: Blood pressure 115/70, pulse 66, temperature 98.2 F (36.8 C), temperature source Oral, resp. rate 20, height 5\' 8"  (1.727 m), weight 83.9 kg (185 lb), SpO2 98 %. General appearance: alert and cooperative Resp: clear to auscultation bilaterally GI: soft, non-tender; bowel sounds normal; no masses,  no organomegaly Skin: Skin color, texture, turgor normal. No rashes or lesions Neurologic: Grossly normal  Disposition: 01-Home or Self Care   Allergies as of 02/27/2017      Reactions   Sulfa Antibiotics Anaphylaxis   Zosyn [piperacillin Sod-tazobactam So] Rash   Has patient had a PCN reaction causing immediate rash, facial/tongue/throat swelling, SOB or lightheadedness with hypotension: No Has patient had a PCN reaction causing severe rash involving mucus membranes or skin necrosis: No Has patient had a PCN reaction that required hospitalization: Unknown--inpatient when reaction occurred Has patient had a PCN reaction occurring within the last 10 years: Yes If all of the above answers are "NO", then may proceed with Cephalosporin use.      Medication List    TAKE these medications   amitriptyline 10 MG tablet Commonly known as:  ELAVIL Take 2 tablets (20 mg total) by mouth at bedtime.   amphetamine-dextroamphetamine 10 MG tablet Commonly known as:  ADDERALL Take 1 tablet (10 mg total) by mouth 2 (two) times daily.   BARIATRIC MULTIVITAMINS/IRON PO Take 1 tablet by mouth 3 (three) times daily.    CALCIUM-VITAMIN D PO Take 1 tablet by mouth 2 (two) times daily. chewable   cephALEXin 500 MG capsule Commonly known as:  KEFLEX Take 1 capsule (500 mg total) by mouth 4 (four) times daily. What changed:  when to take this   clonazePAM 0.5 MG tablet Commonly known as:  KLONOPIN Take 1 tablet (0.5 mg total) by mouth 3 (three) times daily as needed for anxiety.   Desvenlafaxine Succinate ER 25 MG Tb24 Commonly known as:  PRISTIQ Take 25 mg by mouth daily.   docusate sodium 100 MG capsule Commonly known as:  COLACE Take 100 mg by mouth daily.   eletriptan 40 MG tablet Commonly known as:  RELPAX Take 1 tablet (40 mg total) by mouth every 2 (two) hours as needed for migraine or headache. Do not use >2 doses/24 hours   feeding supplement (VITAL HIGH PROTEIN) Liqd liquid Place 1,000 mLs into feeding tube daily. 59ml/hr for 8hrs a day as needed for poor oral intake; 300 ml free water q4hr What changed:  additional instructions   hydrocortisone 25 MG suppository Commonly known as:  ANUSOL-HC Place 1 suppository (25 mg total) rectally 2 (two) times daily as needed for hemorrhoids. - after a bowel movement if possible   ipratropium 0.03 % nasal spray Commonly known as:  ATROVENT Place 2 sprays into the nose 4 (four) times daily.   lactulose 10 GM/15ML solution Commonly known as:  CHRONULAC Take 30 g by mouth daily as needed for moderate constipation.   linaclotide 145 MCG Caps capsule Commonly known as:  LINZESS Take 1 capsule (145 mcg total) by mouth daily before breakfast. As needed for chronic constipation   metoCLOPramide  5 MG tablet Commonly known as:  REGLAN Take 1 tablet (5 mg total) by mouth 4 (four) times daily -  before meals and at bedtime.   mupirocin ointment 2 % Commonly known as:  BACTROBAN Apply 1 application topically 3 (three) times daily.   ondansetron 4 MG disintegrating tablet Commonly known as:  ZOFRAN-ODT Take 1 tablet (4 mg total) by mouth every 6  (six) hours as needed for nausea.   ondansetron 4 MG tablet Commonly known as:  ZOFRAN Take 1 tablet (4 mg total) by mouth every 6 (six) hours. What changed:    when to take this  reasons to take this   oxyCODONE 5 MG immediate release tablet Commonly known as:  Oxy IR/ROXICODONE Take 1-2 tablets (5-10 mg total) by mouth every 4 (four) hours as needed for moderate pain. What changed:  how much to take   pantoprazole 40 MG tablet Commonly known as:  PROTONIX Take 40 mg by mouth daily.   polyethylene glycol packet Commonly known as:  MIRALAX / GLYCOLAX Take 17 g by mouth daily as needed (for constipation.).   simethicone 80 MG chewable tablet Commonly known as:  MYLICON Chew 80 mg by mouth every 6 (six) hours as needed for flatulence.   topiramate 50 MG tablet Commonly known as:  TOPAMAX Take 1.5 tablets (75 mg total) by mouth daily.   VITAMIN B-12 SL Place 1 drop under the tongue daily. 1 dropperful   XULANE 150-35 MCG/24HR transdermal patch Generic drug:  norelgestromin-ethinyl estradiol Place 1 patch onto the skin every Sunday.        Signed: Clovis Riley 02/27/2017, 8:01 AM

## 2017-02-27 NOTE — Progress Notes (Signed)
Nurse reviewed discharge instructions with pt.  Pt verbalized  Understanding of discharge instructions, follow up appointments and new medications.  Prescription given to pt prior to discharge.

## 2017-02-28 ENCOUNTER — Encounter (HOSPITAL_COMMUNITY): Payer: Self-pay | Admitting: General Surgery

## 2017-03-08 ENCOUNTER — Other Ambulatory Visit: Payer: Self-pay | Admitting: Family Medicine

## 2017-03-08 NOTE — Telephone Encounter (Signed)
Copied from [place] [id number]. Topic: Quick Communication - See Telephone Encounter >> Mar 08, 2017  1:52 PM Hewitt Shorts wrote: CRM for notification. See Telephone encounter for: pt is needing a refill on oxycodone 5 mg and she is wanting to know if she is able to have it sent to the pharmacy or does she need to come and pick it up and take to the pharmacy   Best [phone]  03/08/17.

## 2017-03-08 NOTE — Telephone Encounter (Signed)
Last office visit 02/12/17. Requesting Oxycodone refill. Uses [facility] pt. Pharmacy. Call back number [phone]. Thanks.

## 2017-03-10 ENCOUNTER — Ambulatory Visit
Admission: RE | Admit: 2017-03-10 | Discharge: 2017-03-10 | Disposition: A | Discharge status: Home / Self Care | Payer: BLUE CROSS/BLUE SHIELD | Source: Ambulatory Visit | Attending: Adult Health | Admitting: Adult Health

## 2017-03-10 DIAGNOSIS — G43019 Migraine without aura, intractable, without status migrainosus: Secondary | ICD-10-CM | POA: Diagnosis not present

## 2017-03-10 NOTE — Telephone Encounter (Signed)
PEC message re: Oxycodone refill sent to Dr. Brigitte Pulse

## 2017-03-11 ENCOUNTER — Ambulatory Visit (INDEPENDENT_AMBULATORY_CARE_PROVIDER_SITE_OTHER): Payer: BLUE CROSS/BLUE SHIELD | Admitting: Psychiatry

## 2017-03-11 ENCOUNTER — Telehealth: Payer: Self-pay | Admitting: *Deleted

## 2017-03-11 ENCOUNTER — Other Ambulatory Visit: Payer: Self-pay | Admitting: Plastic Surgery

## 2017-03-11 DIAGNOSIS — F900 Attention-deficit hyperactivity disorder, predominantly inattentive type: Secondary | ICD-10-CM

## 2017-03-11 DIAGNOSIS — Z139 Encounter for screening, unspecified: Secondary | ICD-10-CM

## 2017-03-11 DIAGNOSIS — F4323 Adjustment disorder with mixed anxiety and depressed mood: Secondary | ICD-10-CM

## 2017-03-11 NOTE — Telephone Encounter (Signed)
Pt is asking for a call back with MRI results , if she is not available pt is asking that a detailed message be left and she will call back

## 2017-03-11 NOTE — Telephone Encounter (Signed)
LVM requesting call back, re: MRI brain results.

## 2017-03-11 NOTE — Telephone Encounter (Signed)
See other phone note today. Patient called.

## 2017-03-11 NOTE — Telephone Encounter (Signed)
Who did pt get her last refill from - Dr. Romana Juniper wrote rx for #20 oxycodone on 12/22?  But nothing else in chart. . .   I need to know what this was from before I can send in a rx.  Please call pt and ask

## 2017-03-11 NOTE — Telephone Encounter (Signed)
Returned patient's call and informed her that her MRI brain showed no acute findings, stable from previous MRI. Patient verbalized understanding, appreciation.

## 2017-03-12 ENCOUNTER — Other Ambulatory Visit: Payer: Self-pay

## 2017-03-12 ENCOUNTER — Other Ambulatory Visit: Payer: Self-pay | Admitting: Family Medicine

## 2017-03-12 ENCOUNTER — Encounter: Payer: Self-pay | Admitting: Family Medicine

## 2017-03-12 ENCOUNTER — Telehealth: Payer: Self-pay

## 2017-03-12 ENCOUNTER — Ambulatory Visit: Payer: BLUE CROSS/BLUE SHIELD | Admitting: Family Medicine

## 2017-03-12 ENCOUNTER — Telehealth (HOSPITAL_COMMUNITY): Payer: Self-pay

## 2017-03-12 VITALS — BP 140/82 | HR 109 | Temp 99.1°F | Resp 16 | Ht 67.32 in | Wt 185.0 lb

## 2017-03-12 DIAGNOSIS — R062 Wheezing: Secondary | ICD-10-CM | POA: Diagnosis not present

## 2017-03-12 DIAGNOSIS — R1013 Epigastric pain: Secondary | ICD-10-CM

## 2017-03-12 DIAGNOSIS — J209 Acute bronchitis, unspecified: Secondary | ICD-10-CM

## 2017-03-12 DIAGNOSIS — R509 Fever, unspecified: Secondary | ICD-10-CM

## 2017-03-12 LAB — POCT INFLUENZA A/B
Influenza A, POC: NEGATIVE
Influenza B, POC: NEGATIVE

## 2017-03-12 LAB — POCT RAPID STREP A (OFFICE): Rapid Strep A Screen: NEGATIVE

## 2017-03-12 MED ORDER — ALBUTEROL SULFATE HFA 108 (90 BASE) MCG/ACT IN AERS: 2.0000 | INHALATION_SPRAY | Freq: Four times a day (QID) | RESPIRATORY_TRACT | 0 refills | Status: DC | PRN

## 2017-03-12 MED ORDER — IPRATROPIUM BROMIDE 0.02 % IN SOLN
0.5000 mg | Freq: Once | RESPIRATORY_TRACT | Status: AC
  Administered 2017-03-12: 0.5 mg via RESPIRATORY_TRACT

## 2017-03-12 MED ORDER — ALBUTEROL SULFATE (2.5 MG/3ML) 0.083% IN NEBU
2.5000 mg | INHALATION_SOLUTION | Freq: Once | RESPIRATORY_TRACT | Status: AC
  Administered 2017-03-12: 2.5 mg via RESPIRATORY_TRACT

## 2017-03-12 MED ORDER — OXYCODONE HCL 5 MG PO TABS: 5.0000 mg | ORAL_TABLET | Freq: Two times a day (BID) | ORAL | 0 refills | Status: DC | PRN

## 2017-03-12 MED FILL — oxyCODONE HCL 5 MG TABS: 5 | 30 days supply | Qty: 60 | Fill #0

## 2017-03-12 MED FILL — VENTOLIN HFA 90 MCG INHALER: 108 (90 BAS | 25 days supply | Qty: 18 | Fill #0

## 2017-03-12 MED FILL — TOPIRAMATE 50 MG TABLET: 50 | 30 days supply | Qty: 45 | Fill #0

## 2017-03-12 MED FILL — METOCLOPRAMIDE 5 MG TABLET: 5 | 30 days supply | Qty: 120 | Fill #1

## 2017-03-12 MED FILL — AMITRIPTYLINE HCL 10 MG TAB: 10 | 30 days supply | Qty: 60 | Fill #3

## 2017-03-12 MED FILL — LINZESS 145 MCG CAPSULE: 145 | 30 days supply | Qty: 30 | Fill #1

## 2017-03-12 NOTE — Patient Instructions (Addendum)
Acute Bronchitis, Adult Acute bronchitis is when air tubes (bronchi) in the lungs suddenly get swollen. The condition can make it hard to breathe. It can also cause these symptoms:  A cough.  Coughing up clear, yellow, or green mucus.  Wheezing.  Chest congestion.  Shortness of breath.  A fever.  Body aches.  Chills.  A sore throat.  Follow these instructions at home: Medicines  Take over-the-counter and prescription medicines only as told by your doctor.  If you were prescribed an antibiotic medicine, take it as told by your doctor. Do not stop taking the antibiotic even if you start to feel better. General instructions  Rest.  Drink enough fluids to keep your pee (urine) clear or pale yellow.  Avoid smoking and secondhand smoke. If you smoke and you need help quitting, ask your doctor. Quitting will help your lungs heal faster.  Use an inhaler, cool mist vaporizer, or humidifier as told by your doctor.  Keep all follow-up visits as told by your doctor. This is important. How is this prevented? To lower your risk of getting this condition again:  Wash your hands often with soap and water. If you cannot use soap and water, use hand sanitizer.  Avoid contact with people who have cold symptoms.  Try not to touch your hands to your mouth, nose, or eyes.  Make sure to get the flu shot every year.  Contact a doctor if:  Your symptoms do not get better in 2 weeks. Get help right away if:  You cough up blood.  You have chest pain.  You have very bad shortness of breath.  You become dehydrated.  You faint (pass out) or keep feeling like you are going to pass out.  You keep throwing up (vomiting).  You have a very bad headache.  Your fever or chills gets worse. This information is not intended to replace advice given to you by your health care provider. Make sure you discuss any questions you have with your health care provider. Document Released:  08/12/2007 Document Revised: 10/02/2015 Document Reviewed: 08/14/2015 Elsevier Interactive Patient Education  2018 [employer].     IF you received an x-ray today, you will receive an invoice from [facility]. Please contact [facility] at [phone] with questions or concerns regarding your invoice.   IF you received labwork today, you will receive an invoice from [facility]. Please contact [facility] at [phone] with questions or concerns regarding your invoice.   Our billing staff will not be able to assist you with questions regarding bills from these companies.  You will be contacted with the lab results as soon as they are available. The fastest way to get your results is to activate your My Chart account. Instructions are located on the last page of this paperwork. If you have not heard from [name] regarding the results in 2 weeks, please contact this office.

## 2017-03-12 NOTE — Telephone Encounter (Signed)
Patient called [name] this am to discuss some issues from past several days including medication needs.  Patient describes incident she experienced with primary care office when trying to refill medications including pain medication.  Patient crying and feels unwell with fever and sore throat and has appointment to see PCP, but after yesterdays interaction she doesn't want to be  thought  a problem.  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  I encouraged Margaret Fletcher to follow up with the neurologist to discuss expectations and results from this MRI.  Patient continued to vent frustrations and tearful throughout.  We did discuss patient followed up with Psychologist yesterday and felt better and she plans to continue with psychologist.  Due to the patients emotional distress I did assess patient for thoughts of self harm which patient denied.  She is frustrated and needed to vent.  We came up with a plan to visit PCP today for acute issue and discuss med refills.   Discussed the medication issues with Dr Redmond Pulling, bariatric surgeon. Will follow up with patient

## 2017-03-12 NOTE — Telephone Encounter (Signed)
Pt requesting refill on oxycodone. Pt stated that she received refill on oxycodone by GI and that she has always had refills done by Dr. Brigitte Pulse. Pt would not allow RN to speak on phone after multiple attempts to redirect. I advised the patient that I'm not familiar with her case and many times there are pain contracts between providers and patients and this request must be sent to Dr. Brigitte Pulse for approval. Pt advised that conversation would end if pt was unwilling to listen or take advise. Pt continued to talk over RN and make threats such as "you will say what I want you say to her"  RN hung up on pt. Please call pt if you believe refill for oxycodone is appropriate because pt does not want to come in for visit or speak with RN regarding this issue.

## 2017-03-12 NOTE — Progress Notes (Signed)
1/4/20191:56 PM  Margaret Fletcher 1970-04-19, 47 y.o. female [id number]  Chief Complaint  Patient presents with  . Sore Throat    with some fever and cough  . Fatigue    x 2 days     HPI:   Patient is a 47 y.o. female with past medical history significant for IIH, chronic epigastric pain s/p gastric bypass who presents today for 2 days of sore throat, feeling tired and feverish, mild non productive cough, mild SOB/chest tightness. Was exposed to great-niece who had RSV, patient reports had flu vaccine this year.   She is also requesting refill of her oxycodone. PCP Dr Brigitte Pulse, she is out of the office. Last rx 02/03/17 #60.  Patient was in the hospital for supportive measures, IVF and EGD due to oral intolerance, chronic, intermittent. Was discharged with a small rx #20 that was filled on 02/27/17.  Depression screen [facility] 2/9 02/12/2017 01/20/2017 01/07/2017  Decreased Interest 2 0 0  Down, Depressed, Hopeless 2 0 0  PHQ - 2 Score 4 0 0  Altered sleeping 3 - -  Tired, decreased energy 3 - -  Change in appetite 3 - -  Feeling bad or failure about yourself  1 - -  Trouble concentrating 2 - -  Moving slowly or fidgety/restless 1 - -  Suicidal thoughts 0 - -  PHQ-9 Score 17 - -  Difficult doing work/chores Somewhat difficult - -    Allergies  Allergen Reactions  . Sulfa Antibiotics Anaphylaxis  . Zosyn [Piperacillin Sod-Tazobactam So] Rash    Has patient had a PCN reaction causing immediate rash, facial/tongue/throat swelling, SOB or lightheadedness with hypotension: No Has patient had a PCN reaction causing severe rash involving mucus membranes or skin necrosis: No Has patient had a PCN reaction that required hospitalization: Unknown--inpatient when reaction occurred Has patient had a PCN reaction occurring within the last 10 years: Yes If all of the above answers are "NO", then may proceed with Cephalosporin use.     Prior to Admission medications   Medication Sig Start  Date End Date Taking? Authorizing Provider  amitriptyline (ELAVIL) 10 MG tablet Take 2 tablets (20 mg total) by mouth at bedtime. 09/14/16  Yes Melvenia Beam, MD  amphetamine-dextroamphetamine (ADDERALL) 10 MG tablet Take 1 tablet (10 mg total) by mouth 2 (two) times daily. 02/02/17  Yes Shawnee Knapp, MD  CALCIUM-VITAMIN D PO Take 1 tablet by mouth 2 (two) times daily. chewable   Yes [provider]  cephALEXin (KEFLEX) 500 MG capsule Take 1 capsule (500 mg total) by mouth 4 (four) times daily. Patient taking differently: Take 500 mg by mouth 3 times daily with meals, bedtime and 2 AM.  02/12/17  Yes Shawnee Knapp, MD  clonazePAM (KLONOPIN) 0.5 MG tablet Take 1 tablet (0.5 mg total) by mouth 3 (three) times daily as needed for anxiety. 01/07/17  Yes Shawnee Knapp, MD  Cyanocobalamin (VITAMIN B-12 SL) Place 1 drop under the tongue daily. 1 dropperful   Yes [provider]  Desvenlafaxine Succinate ER (PRISTIQ) 25 MG TB24 Take 25 mg by mouth daily. 02/12/17  Yes Shawnee Knapp, MD  docusate sodium (COLACE) 100 MG capsule Take 100 mg by mouth daily.   Yes [provider]  eletriptan (RELPAX) 40 MG tablet Take 1 tablet (40 mg total) by mouth every 2 (two) hours as needed for migraine or headache. Do not use >2 doses/24 hours 01/07/17  Yes Shawnee Knapp, MD  hydrocortisone (ANUSOL-HC) 25 MG suppository Place 1 suppository (25 mg total) rectally 2 (two) times daily as needed for hemorrhoids. - after a bowel movement if possible 01/07/17  Yes Shawnee Knapp, MD  ipratropium (ATROVENT) 0.03 % nasal spray Place 2 sprays into the nose 4 (four) times daily. 05/25/16  Yes Shawnee Knapp, MD  lactulose (CHRONULAC) 10 GM/15ML solution Take 30 g by mouth daily as needed for moderate constipation.  01/14/16  Yes [provider]  linaclotide Rolan Lipa) 145 MCG CAPS capsule Take 1 capsule (145 mcg total) by mouth daily before breakfast. As needed for chronic constipation 10/26/16  Yes Shawnee Knapp, MD    metoCLOPramide (REGLAN) 5 MG tablet Take 1 tablet (5 mg total) by mouth 4 (four) times daily -  before meals and at bedtime. 01/07/17  Yes Shawnee Knapp, MD  Multiple Vitamins-Minerals (BARIATRIC MULTIVITAMINS/IRON PO) Take 1 tablet by mouth 3 (three) times daily.   Yes [provider]  mupirocin ointment (BACTROBAN) 2 % Apply 1 application topically 3 (three) times daily. 02/12/17  Yes Shawnee Knapp, MD  Nutritional Supplements (FEEDING SUPPLEMENT, VITAL HIGH PROTEIN,) LIQD liquid Place 1,000 mLs into feeding tube daily. 67ml/hr for 8hrs a day as needed for poor oral intake; 300 ml free water q4hr Patient taking differently: Place 1,000 mLs into feeding tube daily. 37ml/hr for 8hrs a day as needed for poor oral intake; 300 ml free water q4hr 02/24/16  Yes Greer Pickerel, MD  ondansetron (ZOFRAN) 4 MG tablet Take 1 tablet (4 mg total) by mouth every 6 (six) hours. Patient taking differently: Take 4 mg every 8 (eight) hours as needed by mouth for nausea or vomiting.  12/27/16  Yes Nanavati, Ankit, MD  ondansetron (ZOFRAN-ODT) 4 MG disintegrating tablet Take 1 tablet (4 mg total) by mouth every 6 (six) hours as needed for nausea. 02/24/16  Yes Greer Pickerel, MD  oxyCODONE (OXY IR/ROXICODONE) 5 MG immediate release tablet Take 1-2 tablets (5-10 mg total) by mouth every 4 (four) hours as needed for moderate pain. 02/27/17  Yes Clovis Riley, MD  pantoprazole (PROTONIX) 40 MG tablet Take 40 mg by mouth daily.   Yes [provider]  polyethylene glycol (MIRALAX / GLYCOLAX) packet Take 17 g by mouth daily as needed (for constipation.).    Yes [provider]  simethicone (MYLICON) 80 MG chewable tablet Chew 80 mg by mouth every 6 (six) hours as needed for flatulence.   Yes [provider]  topiramate (TOPAMAX) 50 MG tablet Take 1.5 tablets (75 mg total) by mouth daily. 02/22/17  Yes Ward Givens, NP  [name] 150-35 MCG/24HR transdermal patch Place 1 patch onto the skin every  Sunday.  02/11/16  Yes [provider]    Past Medical History:  Diagnosis Date  . Alopecia   . Anxiety   . Autoimmune disease ([place])    "Alopecia"  . Bacterial vaginosis   . Cyst of spleen   . Depression   . Frequent UTI   . Hypertrophy of breast   . IIH (idiopathic intracranial hypertension) 2017   "related to headaches"  . Migraine headache    denies, ruled out - Propranolol.Using for headaches    Past Surgical History:  Procedure Laterality Date  . APPENDECTOMY    . BREAST REDUCTION SURGERY Bilateral 03/26/2015   Procedure: BILATERAL BREAST REDUCTION  ;  Surgeon: Youlanda Roys, MD;  Location: [facility];  Service: Plastics;  Laterality: Bilateral;  . BREATH TEK H  PYLORI N/A 05/21/2014   Procedure: BREATH TEK H PYLORI;  Surgeon: Greer Pickerel, MD;  Location: [facility];  Service: General;  Laterality: N/A;  . CESAREAN SECTION    . CESAREAN SECTION    . CHOLECYSTECTOMY    . DILITATION & CURRETTAGE/HYSTROSCOPY WITH NOVASURE ABLATION N/A 07/19/2012   Procedure: HYSTEROSCOPY WITH NOVASURE ABLATION;  Surgeon: Farrel Gobble. Harrington Challenger, MD;  Location: [facility];  Service: Gynecology;  Laterality: N/A;  . ENDOMETRIAL ABLATION    . ESOPHAGOGASTRODUODENOSCOPY N/A 08/16/2015   Procedure: UPPER ESOPHAGOGASTRODUODENOSCOPY (EGD);  Surgeon: Greer Pickerel, MD;  Location: [facility];  Service: General;  Laterality: N/A;  . ESOPHAGOGASTRODUODENOSCOPY N/A 02/21/2016   Procedure: ESOPHAGOGASTRODUODENOSCOPY (EGD);  Surgeon: Greer Pickerel, MD;  Location: [facility];  Service: General;  Laterality: N/A;  . ESOPHAGOGASTRODUODENOSCOPY N/A 02/24/2017   Procedure: ESOPHAGOGASTRODUODENOSCOPY (EGD);  Surgeon: Greer Pickerel, MD;  Location: [facility];  Service: General;  Laterality: N/A;  . IR GENERIC HISTORICAL  10/04/2015   IR [place] Vivianne Master 10/04/2015 Markus Daft, MD [facility]  . IR GENERIC HISTORICAL  01/31/2016   IR [place] GASTRO/COLONIC TUBE PERCUT W/FLUORO  01/31/2016 Corrie Mckusick, DO [facility]  . IR GENERIC HISTORICAL  04/06/2016   IR [place] TUBE PERCUT W/FLUORO 04/06/2016 Arne Cleveland, MD [facility]  . IR GENERIC HISTORICAL  05/14/2016   IR GASTRIC TUBE PERC CHG W/O IMG GUIDE 05/14/2016 Sandi Mariscal, MD [facility]  . LAPAROSCOPIC GASTROSTOMY N/A 09/30/2015   Procedure: LAPAROSCOPIC GASTROSTOMY TUBE PLACEMENT;  Surgeon: Greer Pickerel, MD;  Location: [facility];  Service: General;  Laterality: N/A;  . LAPAROSCOPIC ROUX-EN-Y GASTRIC BYPASS WITH HIATAL HERNIA REPAIR N/A 07/08/2015   Procedure: LAPAROSCOPIC ROUX-EN-Y GASTRIC BYPASS WITH HIATAL HERNIA REPAIR WITH UPPER ENDOSCOPY;  Surgeon: Greer Pickerel, MD;  Location: [facility];  Service: General;  Laterality: N/A;  . LAPAROSCOPIC SMALL BOWEL RESECTION N/A 09/30/2015   Procedure: LAPAROSCOPIC REVISION OF ROUX LIMB;  Surgeon: Greer Pickerel, MD;  Location: [facility];  Service: General;  Laterality: N/A;  . LAPAROSCOPY N/A 09/30/2015   Procedure: LAPAROSCOPY DIAGNOSTIC;  Surgeon: Greer Pickerel, MD;  Location: [facility];  Service: General;  Laterality: N/A;  . TUBAL LIGATION    . WISDOM TOOTH EXTRACTION      Social History   Tobacco Use  . Smoking status: Never Smoker  . Smokeless tobacco: Never Used  Substance Use Topics  . Alcohol use: Yes    Comment: socially    Family History  Problem Relation Age of Onset  . Cancer Mother   . Stroke Father   . Diabetes Brother   . Seizures Son   . Migraines Neg Hx     ROS Per hpi  OBJECTIVE:  Blood pressure 140/82, pulse (!) 109, temperature 99.1 F (37.3 C), temperature source Oral, resp. rate 16, height 5' 7.32" (1.71 m), weight 185 lb (83.9 kg), SpO2 98 %.  Physical Exam  Constitutional: She is oriented to person, place, and time and well-developed, well-nourished, and in no distress.  HENT:  Head: Normocephalic and atraumatic.  Right Ear: Hearing, tympanic membrane, external ear and ear canal normal.  Left Ear: Hearing, tympanic membrane,  external ear and ear canal normal.  Mouth/Throat: Posterior oropharyngeal edema and posterior oropharyngeal erythema present. No oropharyngeal exudate.  Eyes: EOM are normal. Pupils are equal, round, and reactive to light.  Neck: Neck supple.  Cardiovascular: Normal rate, regular rhythm and normal heart sounds. Exam reveals no gallop and no friction rub.  No murmur heard. Pulmonary/Chest: Effort normal.  She has wheezes. She has no rales.  Lymphadenopathy:    She has no cervical adenopathy.  Neurological: She is alert and oriented to person, place, and time. Gait normal.  Skin: Skin is warm and dry.     Results for orders placed or performed in visit on 03/12/17 (from the past 24 hour(s))  POCT rapid strep A     Status: None   Collection Time: 03/12/17  2:04 PM  Result Value Ref Range   Rapid Strep A Screen Negative Negative  POCT Influenza A/B     Status: None   Collection Time: 03/12/17  2:04 PM  Result Value Ref Range   Influenza A, POC Negative Negative   Influenza B, POC Negative Negative     ASSESSMENT and PLAN 1. Acute bronchitis, unspecified organism Most likely viral given recent exposures. No indication for antibiotics at this time. Discussed supportive measures, new meds r/se/b and RTC precautions. Patient educational handout given. - albuterol (PROVENTIL HFA;VENTOLIN HFA) 108 (90 Base) MCG/ACT inhaler; Inhale 2 puffs into the lungs every 6 (six) hours as needed for wheezing or shortness of breath.   2. Fever, unspecified fever cause - POCT rapid strep A - POCT Influenza A/B - Culture, Group A Strep  3. Wheezing Resolved after duoneb treatment. Rx for albuterol given.  - albuterol (PROVENTIL) (2.5 MG/3ML) 0.083% nebulizer solution 2.5 mg - ipratropium (ATROVENT) nebulizer solution 0.5 mg  4. Epigastric pain PCP [facility], [place] CSR reviewed. Med refilled. FU with PCP in 4 weeks. - oxyCODONE (OXY IR/ROXICODONE) 5 MG immediate release tablet; Take 1 tablet (5 mg total) by  mouth 2 (two) times daily as needed for moderate pain.   Return if symptoms worsen or fail to improve.    Rutherford Guys, MD Primary Care at [facility] [place], [place] [address] Ph.  [phone] Fax [phone]

## 2017-03-13 MED FILL — XULANE PATCH: 150-35 | 21 days supply | Qty: 3 | Fill #2

## 2017-03-13 NOTE — Telephone Encounter (Signed)
Pt seen by [place] yest 1/4 - med was refilled x 1 mo appropriately.

## 2017-03-13 NOTE — Telephone Encounter (Signed)
disreguard - pt was seen in office and med refilled.

## 2017-03-14 MED FILL — PANTOPRAZOLE SOD DR 40 MG T: 40 | 30 days supply | Qty: 30 | Fill #1

## 2017-03-15 ENCOUNTER — Ambulatory Visit: Payer: BLUE CROSS/BLUE SHIELD | Admitting: Family Medicine

## 2017-03-15 VITALS — BP 138/92 | HR 92 | Temp 98.9°F | Resp 16 | Ht 67.0 in | Wt 182.0 lb

## 2017-03-15 DIAGNOSIS — R1084 Generalized abdominal pain: Secondary | ICD-10-CM

## 2017-03-15 DIAGNOSIS — F4323 Adjustment disorder with mixed anxiety and depressed mood: Secondary | ICD-10-CM

## 2017-03-15 DIAGNOSIS — J40 Bronchitis, not specified as acute or chronic: Secondary | ICD-10-CM | POA: Diagnosis not present

## 2017-03-15 DIAGNOSIS — J329 Chronic sinusitis, unspecified: Secondary | ICD-10-CM | POA: Diagnosis not present

## 2017-03-15 DIAGNOSIS — R110 Nausea: Secondary | ICD-10-CM

## 2017-03-15 DIAGNOSIS — G932 Benign intracranial hypertension: Secondary | ICD-10-CM | POA: Diagnosis not present

## 2017-03-15 DIAGNOSIS — E440 Moderate protein-calorie malnutrition: Secondary | ICD-10-CM

## 2017-03-15 LAB — CULTURE, GROUP A STREP: Strep A Culture: NEGATIVE

## 2017-03-15 MED ORDER — OXYCODONE HCL 5 MG PO TABS: 5.0000 mg | ORAL_TABLET | Freq: Two times a day (BID) | ORAL | 0 refills | Status: DC | PRN

## 2017-03-15 MED ORDER — CLONAZEPAM 0.5 MG PO TABS: 0.5000 mg | ORAL_TABLET | Freq: Three times a day (TID) | ORAL | 2 refills | Status: DC | PRN

## 2017-03-15 MED ORDER — AZITHROMYCIN 250 MG PO TABS: ORAL_TABLET | ORAL | 0 refills | Status: DC

## 2017-03-15 MED ORDER — HYDROCOD POLST-CPM POLST ER 10-8 MG/5ML PO SUER: 5.0000 mL | Freq: Two times a day (BID) | ORAL | 0 refills | Status: DC | PRN

## 2017-03-15 MED ORDER — METOCLOPRAMIDE HCL 5 MG PO TABS: 5.0000 mg | ORAL_TABLET | Freq: Three times a day (TID) | ORAL | 2 refills | Status: DC

## 2017-03-15 MED ORDER — DESVENLAFAXINE SUCCINATE ER 50 MG PO TB24: 50.0000 mg | ORAL_TABLET | Freq: Every day | ORAL | 3 refills | Status: DC

## 2017-03-15 MED FILL — clonazePAM 0.5 MG TABS: 0.5 | 30 days supply | Qty: 90 | Fill #0

## 2017-03-15 MED FILL — DESVENLAFAXINE SUC ER 50 MG: 50 | 30 days supply | Qty: 30 | Fill #0

## 2017-03-15 MED FILL — AZITHROMYCIN 250 MG TAB: 250 | 5 days supply | Qty: 6 | Fill #0

## 2017-03-15 MED FILL — HYDROCODONE-CHLORPHENIRAM S: 10-8 | 12 days supply | Qty: 120 | Fill #0

## 2017-03-15 NOTE — Patient Instructions (Addendum)
   IF you received an x-ray today, you will receive an invoice from [facility]. Please contact [facility] at [phone] with questions or concerns regarding your invoice.   IF you received labwork today, you will receive an invoice from [facility]. Please contact [facility] at [phone] with questions or concerns regarding your invoice.   Our billing staff will not be able to assist you with questions regarding bills from these companies.  You will be contacted with the lab results as soon as they are available. The fastest way to get your results is to activate your My Chart account. Instructions are located on the last page of this paperwork. If you have not heard from us regarding the results in 2 weeks, please contact this office.      Sinusitis, Adult Sinusitis is soreness and inflammation of your sinuses. Sinuses are hollow spaces in the bones around your face. Your sinuses are located:  Around your eyes.  In the middle of your forehead.  Behind your nose.  In your cheekbones.  Your sinuses and nasal passages are lined with a stringy fluid (mucus). Mucus normally drains out of your sinuses. When your nasal tissues become inflamed or swollen, the mucus can become trapped or blocked so air cannot flow through your sinuses. This allows bacteria, viruses, and funguses to grow, which leads to infection. Sinusitis can develop quickly and last for 7?10 days (acute) or for more than 12 weeks (chronic). Sinusitis often develops after a cold. What are the causes? This condition is caused by anything that creates swelling in the sinuses or stops mucus from draining, including:  Allergies.  Asthma.  Bacterial or viral infection.  Abnormally shaped bones between the nasal passages.  Nasal growths that contain mucus (nasal polyps).  Narrow sinus openings.  Pollutants, such as chemicals or irritants in the air.  A foreign object stuck in the nose.  A fungal  infection. This is rare.  What increases the risk? The following factors may make you more likely to develop this condition:  Having allergies or asthma.  Having had a recent cold or respiratory tract infection.  Having structural deformities or blockages in your nose or sinuses.  Having a weak immune system.  Doing a lot of swimming or diving.  Overusing nasal sprays.  Smoking.  What are the signs or symptoms? The main symptoms of this condition are pain and a feeling of pressure around the affected sinuses. Other symptoms include:  Upper toothache.  Earache.  Headache.  Bad breath.  Decreased sense of smell and taste.  A cough that may get worse at night.  Fatigue.  Fever.  Thick drainage from your nose. The drainage is often green and it may contain pus (purulent).  Stuffy nose or congestion.  Postnasal drip. This is when extra mucus collects in the throat or back of the nose.  Swelling and warmth over the affected sinuses.  Sore throat.  Sensitivity to light.  How is this diagnosed? This condition is diagnosed based on symptoms, a medical history, and a physical exam. To find out if your condition is acute or chronic, your health care provider may:  Look in your nose for signs of nasal polyps.  Tap over the affected sinus to check for signs of infection.  View the inside of your sinuses using an imaging device that has a light attached (endoscope).  If your health care provider suspects that you have chronic sinusitis, you may also:  Be tested for allergies.    Have a sample of mucus taken from your nose (nasal culture) and checked for bacteria.  Have a mucus sample examined to see if your sinusitis is related to an allergy.  If your sinusitis does not respond to treatment and it lasts longer than 8 weeks, you may have an MRI or CT scan to check your sinuses. These scans also help to determine how severe your infection is. In rare cases, a bone  biopsy may be done to rule out more serious types of fungal sinus disease. How is this treated? Treatment for sinusitis depends on the cause and whether your condition is chronic or acute. If a virus is causing your sinusitis, your symptoms will go away on their own within 10 days. You may be given medicines to relieve your symptoms, including:  Topical nasal decongestants. They shrink swollen nasal passages and let mucus drain from your sinuses.  Antihistamines. These drugs block inflammation that is triggered by allergies. This can help to ease swelling in your nose and sinuses.  Topical nasal corticosteroids. These are nasal sprays that ease inflammation and swelling in your nose and sinuses.  Nasal saline washes. These rinses can help to get rid of thick mucus in your nose.  If your condition is caused by bacteria, you will be given an antibiotic medicine. If your condition is caused by a fungus, you will be given an antifungal medicine. Surgery may be needed to correct underlying conditions, such as narrow nasal passages. Surgery may also be needed to remove polyps. Follow these instructions at home: Medicines  Take, use, or apply over-the-counter and prescription medicines only as told by your health care provider. These may include nasal sprays.  If you were prescribed an antibiotic medicine, take it as told by your health care provider. Do not stop taking the antibiotic even if you start to feel better. Hydrate and Humidify  Drink enough water to keep your urine clear or pale yellow. Staying hydrated will help to thin your mucus.  Use a cool mist humidifier to keep the humidity level in your home above 50%.  Inhale steam for 10-15 minutes, 3-4 times a day or as told by your health care provider. You can do this in the bathroom while a hot shower is running.  Limit your exposure to cool or dry air. Rest  Rest as much as possible.  Sleep with your head raised  (elevated).  Make sure to get enough sleep each night. General instructions  Apply a warm, moist washcloth to your face 3-4 times a day or as told by your health care provider. This will help with discomfort.  Wash your hands often with soap and water to reduce your exposure to viruses and other germs. If soap and water are not available, use hand sanitizer.  Do not smoke. Avoid being around people who are smoking (secondhand smoke).  Keep all follow-up visits as told by your health care provider. This is important. Contact a health care provider if:  You have a fever.  Your symptoms get worse.  Your symptoms do not improve within 10 days. Get help right away if:  You have a severe headache.  You have persistent vomiting.  You have pain or swelling around your face or eyes.  You have vision problems.  You develop confusion.  Your neck is stiff.  You have trouble breathing. This information is not intended to replace advice given to you by your health care provider. Make sure you discuss any questions you   have with your health care provider. Document Released: 02/23/2005 Document Revised: 10/20/2015 Document Reviewed: 12/19/2014 Elsevier Interactive Patient Education  2018 Elsevier Inc.  

## 2017-03-15 NOTE — Progress Notes (Signed)
Subjective:  By signing my name below, I, [name], attest that this documentation has been prepared under the direction and in the presence of [name], MD Electronically Signed: Ladene Artist, ED Scribe 03/15/2017 at 12:32 PM.   Patient ID: Margaret Fletcher, female    DOB: May 17, 1970, 47 y.o.   MRN: [id number]  Chief Complaint  Patient presents with  . Follow-up    coughing,wheezing,fever. pt states she is not felling better   HPI Margaret Fletcher is a 47 y.o. female who presents to Primary Care at [facility] for f/u. Pt was seen by Dr. Pamella Pert 3 days ago for 2 days of fatigue, sore throat, cough sob. Last filled Adderall 10 bid #60 on 12/3 and clonazepam 0.5 #90 on 11/28 written 11/1, no refills. She was prescribed Keflex 500 qid 1 month prior for possible RLE cellulitis but did not take until 2 wks ago. At visit 3 days ago, neg flu and strep. Neg throat culture. Symptoms improved after neb in office so suspected viral and prescribed albuterol inhaler. At our visit 1 month prior, pt was started on Pristiq for worsening anxiety. Has failed sertraline, mirtazapine and Wellbutrin. She saw neuro 3 wks prior who did a MRI as she was now having extremity weakness and heaviness with migraines. Topamax was increased to 75 mg at that time and continued on amitriptyline 25 mg qhs. Recent egfr has dropped from high 50s to above 60 in past month but persistent on 4 rechecks with new baseline creatinine 1.25-1.4.  Pt reports fevers, chills, R ear pain, R-sided sinus pressure/pain, minimally productive cough, wheezing, HA, intermittent light-headedness and dizziness. Last used inhaler PTA.  She is unsure if increasing Topamax has improved HAs since she is currently experiencing a HA.  Past Medical History:  Diagnosis Date  . Alopecia   . Anxiety   . Autoimmune disease ([place])    "Alopecia"  . Bacterial vaginosis   . Cyst of spleen   . Depression   . Frequent UTI   . Hypertrophy of breast   . IIH  (idiopathic intracranial hypertension) 2017   "related to headaches"  . Migraine headache    denies, ruled out - Propranolol.Using for headaches   Current Outpatient Medications on File Prior to Visit  Medication Sig Dispense Refill  . albuterol (PROVENTIL HFA;VENTOLIN HFA) 108 (90 Base) MCG/ACT inhaler Inhale 2 puffs into the lungs every 6 (six) hours as needed for wheezing or shortness of breath. 1 Inhaler 0  . amitriptyline (ELAVIL) 10 MG tablet Take 2 tablets (20 mg total) by mouth at bedtime. 60 tablet 11  . amphetamine-dextroamphetamine (ADDERALL) 10 MG tablet Take 1 tablet (10 mg total) by mouth 2 (two) times daily. 60 tablet 0  . CALCIUM-VITAMIN D PO Take 1 tablet by mouth 2 (two) times daily. chewable    . cephALEXin (KEFLEX) 500 MG capsule Take 1 capsule (500 mg total) by mouth 4 (four) times daily. (Patient taking differently: Take 500 mg by mouth 3 times daily with meals, bedtime and 2 AM. ) 28 capsule 0  . clonazePAM (KLONOPIN) 0.5 MG tablet Take 1 tablet (0.5 mg total) by mouth 3 (three) times daily as needed for anxiety. 90 tablet 0  . Cyanocobalamin (VITAMIN B-12 SL) Place 1 drop under the tongue daily. 1 dropperful    . Desvenlafaxine Succinate ER (PRISTIQ) 25 MG TB24 Take 25 mg by mouth daily. 30 tablet 0  . Desvenlafaxine Succinate ER 25 MG TB24 TAKE 1 TABLET BY MOUTH ONCE  DAILY 30 tablet 0  . docusate sodium (COLACE) 100 MG capsule Take 100 mg by mouth daily.    Marland Kitchen eletriptan (RELPAX) 40 MG tablet Take 1 tablet (40 mg total) by mouth every 2 (two) hours as needed for migraine or headache. Do not use >2 doses/24 hours 10 tablet 11  . hydrocortisone (ANUSOL-HC) 25 MG suppository Place 1 suppository (25 mg total) rectally 2 (two) times daily as needed for hemorrhoids. - after a bowel movement if possible 24 suppository 1  . ipratropium (ATROVENT) 0.03 % nasal spray Place 2 sprays into the nose 4 (four) times daily. 30 mL 1  . lactulose (CHRONULAC) 10 GM/15ML solution Take 30 g by  mouth daily as needed for moderate constipation.   0  . linaclotide (LINZESS) 145 MCG CAPS capsule Take 1 capsule (145 mcg total) by mouth daily before breakfast. As needed for chronic constipation 30 capsule 5  . metoCLOPramide (REGLAN) 5 MG tablet Take 1 tablet (5 mg total) by mouth 4 (four) times daily -  before meals and at bedtime. 120 tablet 1  . Multiple Vitamins-Minerals (BARIATRIC MULTIVITAMINS/IRON PO) Take 1 tablet by mouth 3 (three) times daily.    . mupirocin ointment (BACTROBAN) 2 % Apply 1 application topically 3 (three) times daily. 22 g 1  . Nutritional Supplements (FEEDING SUPPLEMENT, VITAL HIGH PROTEIN,) LIQD liquid Place 1,000 mLs into feeding tube daily. 5m/hr for 8hrs a day as needed for poor oral intake; 300 ml free water q4hr (Patient taking differently: Place 1,000 mLs into feeding tube daily. 42mhr for 8hrs a day as needed for poor oral intake; 300 ml free water q4hr) 1000 mL 8  . ondansetron (ZOFRAN) 4 MG tablet Take 1 tablet (4 mg total) by mouth every 6 (six) hours. (Patient taking differently: Take 4 mg every 8 (eight) hours as needed by mouth for nausea or vomiting. ) 12 tablet 0  . ondansetron (ZOFRAN-ODT) 4 MG disintegrating tablet Take 1 tablet (4 mg total) by mouth every 6 (six) hours as needed for nausea. 30 tablet 0  . oxyCODONE (OXY IR/ROXICODONE) 5 MG immediate release tablet Take 1 tablet (5 mg total) by mouth 2 (two) times daily as needed for moderate pain. 60 tablet 0  . pantoprazole (PROTONIX) 40 MG tablet Take 40 mg by mouth daily.    . polyethylene glycol (MIRALAX / GLYCOLAX) packet Take 17 g by mouth daily as needed (for constipation.).     . Marland Kitchenimethicone (MYLICON) 80 MG chewable tablet Chew 80 mg by mouth every 6 (six) hours as needed for flatulence.    . topiramate (TOPAMAX) 50 MG tablet Take 1.5 tablets (75 mg total) by mouth daily. 45 tablet 11  . XULANE 150-35 MCG/24HR transdermal patch Place 1 patch onto the skin every Sunday.   11   No current  facility-administered medications on file prior to visit.    Allergies  Allergen Reactions  . Sulfa Antibiotics Anaphylaxis  . Zosyn [Piperacillin Sod-Tazobactam So] Rash    Has patient had a PCN reaction causing immediate rash, facial/tongue/throat swelling, SOB or lightheadedness with hypotension: No Has patient had a PCN reaction causing severe rash involving mucus membranes or skin necrosis: No Has patient had a PCN reaction that required hospitalization: Unknown--inpatient when reaction occurred Has patient had a PCN reaction occurring within the last 10 years: Yes If all of the above answers are "NO", then may proceed with Cephalosporin use.    Past Surgical History:  Procedure Laterality Date  . APPENDECTOMY    .  BREAST REDUCTION SURGERY Bilateral 03/26/2015   Procedure: BILATERAL BREAST REDUCTION  ;  Surgeon: Youlanda Roys, MD;  Location: [place];  Service: Plastics;  Laterality: Bilateral;  . BREATH TEK H PYLORI N/A 05/21/2014   Procedure: BREATH TEK H PYLORI;  Surgeon: Greer Pickerel, MD;  Location: [facility];  Service: General;  Laterality: N/A;  . CESAREAN SECTION    . CESAREAN SECTION    . CHOLECYSTECTOMY    . DILITATION & CURRETTAGE/HYSTROSCOPY WITH NOVASURE ABLATION N/A 07/19/2012   Procedure: HYSTEROSCOPY WITH NOVASURE ABLATION;  Surgeon: Farrel Gobble. Harrington Challenger, MD;  Location: [facility];  Service: Gynecology;  Laterality: N/A;  . ENDOMETRIAL ABLATION    . ESOPHAGOGASTRODUODENOSCOPY N/A 08/16/2015   Procedure: UPPER ESOPHAGOGASTRODUODENOSCOPY (EGD);  Surgeon: Greer Pickerel, MD;  Location: [facility];  Service: General;  Laterality: N/A;  . ESOPHAGOGASTRODUODENOSCOPY N/A 02/21/2016   Procedure: ESOPHAGOGASTRODUODENOSCOPY (EGD);  Surgeon: Greer Pickerel, MD;  Location: [facility];  Service: General;  Laterality: N/A;  . ESOPHAGOGASTRODUODENOSCOPY N/A 02/24/2017   Procedure: ESOPHAGOGASTRODUODENOSCOPY (EGD);  Surgeon: Greer Pickerel, MD;  Location: [facility];   Service: General;  Laterality: N/A;  . IR GENERIC HISTORICAL  10/04/2015   IR [place] Vivianne Master 10/04/2015 Markus Daft, MD [facility]  . IR GENERIC HISTORICAL  01/31/2016   IR [place] GASTRO/COLONIC TUBE PERCUT W/FLUORO 01/31/2016 Corrie Mckusick, DO [facility]  . IR GENERIC HISTORICAL  04/06/2016   IR [place] TUBE PERCUT W/FLUORO 04/06/2016 Arne Cleveland, MD [facility]  . IR GENERIC HISTORICAL  05/14/2016   IR GASTRIC TUBE PERC CHG W/O IMG GUIDE 05/14/2016 Sandi Mariscal, MD [facility]  . LAPAROSCOPIC GASTROSTOMY N/A 09/30/2015   Procedure: LAPAROSCOPIC GASTROSTOMY TUBE PLACEMENT;  Surgeon: Greer Pickerel, MD;  Location: [facility];  Service: General;  Laterality: N/A;  . LAPAROSCOPIC ROUX-EN-Y GASTRIC BYPASS WITH HIATAL HERNIA REPAIR N/A 07/08/2015   Procedure: LAPAROSCOPIC ROUX-EN-Y GASTRIC BYPASS WITH HIATAL HERNIA REPAIR WITH UPPER ENDOSCOPY;  Surgeon: Greer Pickerel, MD;  Location: [facility];  Service: General;  Laterality: N/A;  . LAPAROSCOPIC SMALL BOWEL RESECTION N/A 09/30/2015   Procedure: LAPAROSCOPIC REVISION OF ROUX LIMB;  Surgeon: Greer Pickerel, MD;  Location: [facility];  Service: General;  Laterality: N/A;  . LAPAROSCOPY N/A 09/30/2015   Procedure: LAPAROSCOPY DIAGNOSTIC;  Surgeon: Greer Pickerel, MD;  Location: [facility];  Service: General;  Laterality: N/A;  . TUBAL LIGATION    . WISDOM TOOTH EXTRACTION     Family History  Problem Relation Age of Onset  . Cancer Mother   . Stroke Father   . Diabetes Brother   . Seizures Son   . Migraines Neg Hx    Social History   Socioeconomic History  . Marital status: Divorced    Spouse name: Not on file  . Number of children: 2  . Years of education: 29  . Highest education level: Not on file  Social Needs  . Financial resource strain: Not on file  . Food insecurity - worry: Not on file  . Food insecurity - inability: Not on file  . Transportation needs - medical: Not on file  . Transportation needs - non-medical: Not  on file  Occupational History  . 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 Smoking status: Never Smoker  . Smokeless tobacco: Never Used  Substance and Sexual Activity  . Alcohol use: Yes    Comment: socially  . Drug use: No  . Sexual activity: Yes    Birth control/protection: Surgical  Other Topics Concern  . Not on file  Social History Narrative   Lives with partner   Caffeine use: minimal coffee   Right-handed   Depression screen [facility] 2/9 02/12/2017 01/20/2017 01/07/2017 10/26/2016 10/12/2016  Decreased Interest 2 0 0 3 0  Down, Depressed, Hopeless 2 0 0 3 0  PHQ - 2 Score 4 0 0 6 0  Altered sleeping 3 - - 2 -  Tired, decreased energy 3 - - 2 -  Change in appetite 3 - - 2 -  Feeling bad or failure about yourself  1 - - 0 -  Trouble concentrating 2 - - 1 -  Moving slowly or fidgety/restless 1 - - 3 -  Suicidal thoughts 0 - - 0 -  PHQ-9 Score 17 - - 16 -  Difficult doing work/chores Somewhat difficult - - Extremely dIfficult -    Review of Systems  Constitutional: Positive for chills and fever.  HENT: Positive for ear pain, sinus pressure and sinus pain.   Respiratory: Positive for cough and wheezing.   Neurological: Positive for dizziness (intermittent), light-headedness (intermittent) and headaches.      Objective:   Physical Exam  Constitutional: She is oriented to person, place, and time. She appears well-developed and well-nourished. No distress.  HENT:  Head: Normocephalic and atraumatic.  Right Ear: Tympanic membrane is erythematous. A middle ear effusion (small) is present.  Left Ear: Tympanic membrane normal.  Nose: Nose normal.  Mouth/Throat: Posterior oropharyngeal erythema present.  Eyes: Conjunctivae and EOM are normal.  Neck: Neck supple. No tracheal deviation present.  Cardiovascular: Normal rate.  Pulmonary/Chest: Effort normal. No respiratory distress. She has wheezes. She has rales.  Decreased air movement throughout. R upper inspiratory  rales and R upper lobe expiratory wheezing  Musculoskeletal: Normal range of motion.  Lymphadenopathy:       Head (right side): Tonsillar (R worse than L) adenopathy present.       Head (left side): Tonsillar adenopathy present.    She has cervical adenopathy (anterior, R worse than L).  Neurological: She is alert and oriented to person, place, and time.  Skin: Skin is warm and dry.  Psychiatric: She has a normal mood and affect. Her behavior is normal.  Nursing note and vitals reviewed.  BP (!) 138/92   Pulse 92   Temp 98.9 F (37.2 C) (Oral)   Resp 16   Ht _0  (1.702 m)   Wt 182 lb (82.6 kg)   SpO2 100%   BMI 28.51 kg/m     Assessment & Plan:   1. Sinobronchitis   2. Protein-calorie malnutrition, moderate ([place])   3. IIH (idiopathic intracranial hypertension)   4. Generalized abdominal pain   5. Chronic nausea   6. Adjustment disorder with mixed anxiety and depressed mood      Meds ordered this encounter  Medications  . azithromycin (ZITHROMAX) 250 MG tablet    Sig: Take 2 tabs PO x 1 dose, then 1 tab PO QD x 4 days    Dispense:  6 tablet    Refill:  0  . chlorpheniramine-HYDROcodone (TUSSIONEX PENNKINETIC ER) 10-8 MG/5ML SUER    Sig: Take 5 mLs by mouth every 12 (twelve) hours as needed.    Dispense:  120 mL    Refill:  0  . desvenlafaxine (PRISTIQ) 50 MG 24 hr tablet    Sig: Take 1 tablet (50 mg total) by mouth daily.    Dispense:  30 tablet    Refill:  3    D/c pristiq 25 mg dose  . metoCLOPramide (REGLAN) 5 MG tablet    Sig: Take 1 tablet (5 mg total) by mouth 4 (four) times daily -  before meals and at bedtime.    Dispense:  120 tablet    Refill:  2  . clonazePAM (KLONOPIN) 0.5 MG tablet    Sig: Take 1 tablet (0.5 mg total) by mouth 3 (three) times daily as needed for anxiety.    Dispense:  90 tablet    Refill:  2  . oxyCODONE (OXY IR/ROXICODONE) 5 MG immediate release tablet    Sig: Take 1 tablet (5 mg total) by mouth 2 (two) times daily as needed for  moderate pain.    Dispense:  60 tablet    Refill:  0    I personally performed the services described in this documentation, which was scribed in my presence. The recorded information has been reviewed and considered, and addended by me as needed.   [name], M.D.  Primary Care at [facility] [address] [place], [place] [address] [phone] phone [phone] fax  03/18/17 11:06 AM

## 2017-03-17 ENCOUNTER — Telehealth: Payer: Self-pay | Admitting: Skilled Nursing Facility1

## 2017-03-17 NOTE — Telephone Encounter (Signed)
Margaret Fletcher!  And a happy new year to you!  Thank you for keeping me up to date. As far as I know Dr. Redmond Pulling has a plan for you I am assuming things were a little slow due to the holidays but I am thinking you will be getting some answers shortly especially because you put a message into Dr. Redmond Pulling. We can go ahead and set up an appointment if you like. My schedule is wide open so you just tell me what date and time in maybe February or march and I will put you on the schedule.    -----Original Message----- From: Margaret [email or web address]>  Sent: Tuesday, March 16, 2017 10:25 PM To: Margaret [email or web address]> Subject: [External Email]Margaret Fletcher  *Caution - External Email*  Hi ....happy late holidays to you .Marland KitchenMarland KitchenI did get a hospital admission but got to go home Saturday before Christmas. Still not doing well think in addition to the Abdominal pain I have a touch of pneumonia  if not a bad case of bronchitis ...so I'm on steroids and an inhaler which helps tremendously appetite not good at all to be honest I did reach out to the bariatric coordinator Dawn, to let her know my intake was poor and to relay the information to Dr  Redmond Pulling....pick line could be in my near future.Marland Kitchen...just wanted to touch base with you I'm assuming I need to set up an appointment with you I don't have one for the near future .Marland Kitchen  Thanks Margaret Fletcher   Sent from my iPhone

## 2017-03-18 ENCOUNTER — Ambulatory Visit: Payer: BLUE CROSS/BLUE SHIELD | Admitting: Family Medicine

## 2017-03-22 ENCOUNTER — Encounter: Payer: Self-pay | Admitting: Family Medicine

## 2017-03-22 ENCOUNTER — Ambulatory Visit (INDEPENDENT_AMBULATORY_CARE_PROVIDER_SITE_OTHER): Payer: BLUE CROSS/BLUE SHIELD

## 2017-03-22 ENCOUNTER — Ambulatory Visit: Payer: BLUE CROSS/BLUE SHIELD | Admitting: Family Medicine

## 2017-03-22 VITALS — BP 122/86 | HR 86 | Temp 98.6°F | Resp 16 | Ht 67.0 in | Wt 187.0 lb

## 2017-03-22 DIAGNOSIS — J40 Bronchitis, not specified as acute or chronic: Secondary | ICD-10-CM

## 2017-03-22 DIAGNOSIS — B370 Candidal stomatitis: Secondary | ICD-10-CM | POA: Diagnosis not present

## 2017-03-22 DIAGNOSIS — E440 Moderate protein-calorie malnutrition: Secondary | ICD-10-CM

## 2017-03-22 DIAGNOSIS — R062 Wheezing: Secondary | ICD-10-CM

## 2017-03-22 DIAGNOSIS — J329 Chronic sinusitis, unspecified: Secondary | ICD-10-CM | POA: Diagnosis not present

## 2017-03-22 LAB — POCT CBC
GRANULOCYTE PERCENT: 65.9 % (ref 37–80)
HCT, POC: 40.1 % (ref 37.7–47.9)
Hemoglobin: 13.3 g/dL (ref 12.2–16.2)
Lymph, poc: 2.4 (ref 0.6–3.4)
MCH, POC: 31 pg (ref 27–31.2)
MCHC: 33.1 g/dL (ref 31.8–35.4)
MCV: 93.6 fL (ref 80–97)
MID (CBC): 0.5 (ref 0–0.9)
MPV: 6.9 fL (ref 0–99.8)
POC Granulocyte: 5.5 (ref 2–6.9)
POC LYMPH PERCENT: 28.6 %L (ref 10–50)
POC MID %: 5.5 % (ref 0–12)
Platelet Count, POC: 390 10*3/uL (ref 142–424)
RBC: 4.29 M/uL (ref 4.04–5.48)
RDW, POC: 13.7 %
WBC: 8.4 10*3/uL (ref 4.6–10.2)

## 2017-03-22 LAB — POCT SKIN KOH: Skin KOH, POC: NEGATIVE

## 2017-03-22 MED ORDER — CLOTRIMAZOLE 10 MG MT TROC
10.0000 mg | Freq: Every day | OROMUCOSAL | 0 refills | Status: DC
Start: 2017-03-22 — End: 2018-01-25

## 2017-03-22 MED ORDER — METHYLPREDNISOLONE ACETATE 80 MG/ML IJ SUSP
80.0000 mg | Freq: Once | INTRAMUSCULAR | Status: AC
  Administered 2017-03-22: 80 mg via INTRAMUSCULAR

## 2017-03-22 MED ORDER — IPRATROPIUM BROMIDE 0.02 % IN SOLN
0.5000 mg | Freq: Once | RESPIRATORY_TRACT | Status: AC
  Administered 2017-03-22: 0.5 mg via RESPIRATORY_TRACT

## 2017-03-22 MED ORDER — ALBUTEROL SULFATE (2.5 MG/3ML) 0.083% IN NEBU
2.5000 mg | INHALATION_SOLUTION | Freq: Once | RESPIRATORY_TRACT | Status: AC
  Administered 2017-03-22: 2.5 mg via RESPIRATORY_TRACT

## 2017-03-22 MED ORDER — METHYLPREDNISOLONE ACETATE 80 MG/ML IJ SUSP
80.0000 mg | Freq: Once | INTRAMUSCULAR | Status: DC
Start: 2017-03-22 — End: 2017-03-22

## 2017-03-22 NOTE — Progress Notes (Signed)
Subjective:  By signing my name below, I, Margaret Fletcher, attest that this documentation has been prepared under the direction and in the presence of [name], MD. Electronically Signed: [name], [place]. 03/22/2017 , 5:04 PM .  Patient was seen in Room 1 .   Patient ID: Margaret Fletcher, female    DOB: 1970-09-09, 47 y.o.   MRN: [id number] Chief Complaint  Patient presents with  . Wheezing   HPI Margaret Fletcher is a 47 y.o. female who presents to Primary Care at [facility] for follow up and wheezing. Patient developed URI symptoms with fatigue, fever, sore throat, cough, and shortness of breath 12 days prior. She was seen 10 days prior and noted to be wheezing during her office visit, which resolved after duo-neb treatment. Suspected viral, negative flu and strep test. Prescribed albuterol inhaler which was not filled as no wheezing. Symptoms continued and patient was seen by myself 1 week prior for continuing progression of symptoms with more right maxillary sinus, ear pain and headache, so treated with zpak and tussionex.   Patient states she's feeling a little better, but still feels fatigue, wheezing and coughing. She's been using her inhaler. She denies fever, but still having hot & cold spells. She's still having some productive cough (discolored dark green mucus), as well as sinus pain and pressure. She uses her inhaler every 6 hours. She reports a little more decreased fluid intake than usual. She's noticed white plaque on her tongue.   Past Medical History:  Diagnosis Date  . Alopecia   . Anxiety   . Autoimmune disease ([place])    "Alopecia"  . Bacterial vaginosis   . Cyst of spleen   . Depression   . Frequent UTI   . Hypertrophy of breast   . IIH (idiopathic intracranial hypertension) 2017   "related to headaches"  . Migraine headache    denies, ruled out - Propranolol.Using for headaches   Past Surgical History:  Procedure Laterality Date  . APPENDECTOMY    . BREAST  REDUCTION SURGERY Bilateral 03/26/2015   Procedure: BILATERAL BREAST REDUCTION  ;  Surgeon: Youlanda Roys, MD;  Location: [facility];  Service: Plastics;  Laterality: Bilateral;  . BREATH TEK H PYLORI N/A 05/21/2014   Procedure: BREATH TEK H PYLORI;  Surgeon: Greer Pickerel, MD;  Location: [facility];  Service: General;  Laterality: N/A;  . CESAREAN SECTION    . CESAREAN SECTION    . CHOLECYSTECTOMY    . DILITATION & CURRETTAGE/HYSTROSCOPY WITH NOVASURE ABLATION N/A 07/19/2012   Procedure: HYSTEROSCOPY WITH NOVASURE ABLATION;  Surgeon: Farrel Gobble. Harrington Challenger, MD;  Location: [facility];  Service: Gynecology;  Laterality: N/A;  . ENDOMETRIAL ABLATION    . ESOPHAGOGASTRODUODENOSCOPY N/A 08/16/2015   Procedure: UPPER ESOPHAGOGASTRODUODENOSCOPY (EGD);  Surgeon: Greer Pickerel, MD;  Location: [facility];  Service: General;  Laterality: N/A;  . ESOPHAGOGASTRODUODENOSCOPY N/A 02/21/2016   Procedure: ESOPHAGOGASTRODUODENOSCOPY (EGD);  Surgeon: Greer Pickerel, MD;  Location: [facility];  Service: General;  Laterality: N/A;  . ESOPHAGOGASTRODUODENOSCOPY N/A 02/24/2017   Procedure: ESOPHAGOGASTRODUODENOSCOPY (EGD);  Surgeon: Greer Pickerel, MD;  Location: [facility];  Service: General;  Laterality: N/A;  . IR GENERIC HISTORICAL  10/04/2015   IR [place] Vivianne Master 10/04/2015 Markus Daft, MD [facility]  . IR GENERIC HISTORICAL  01/31/2016   IR [place] GASTRO/COLONIC TUBE PERCUT W/FLUORO 01/31/2016 Corrie Mckusick, DO [facility]  . IR GENERIC HISTORICAL  04/06/2016   IR [place] GASTRO/COLONIC TUBE PERCUT W/FLUORO 04/06/2016  Arne Cleveland, MD [facility]  . IR GENERIC HISTORICAL  05/14/2016   IR GASTRIC TUBE PERC CHG W/O IMG GUIDE 05/14/2016 Sandi Mariscal, MD [facility]  . LAPAROSCOPIC GASTROSTOMY N/A 09/30/2015   Procedure: LAPAROSCOPIC GASTROSTOMY TUBE PLACEMENT;  Surgeon: Greer Pickerel, MD;  Location: [facility];  Service: General;  Laterality: N/A;  . LAPAROSCOPIC ROUX-EN-Y GASTRIC BYPASS  WITH HIATAL HERNIA REPAIR N/A 07/08/2015   Procedure: LAPAROSCOPIC ROUX-EN-Y GASTRIC BYPASS WITH HIATAL HERNIA REPAIR WITH UPPER ENDOSCOPY;  Surgeon: Greer Pickerel, MD;  Location: [facility];  Service: General;  Laterality: N/A;  . LAPAROSCOPIC SMALL BOWEL RESECTION N/A 09/30/2015   Procedure: LAPAROSCOPIC REVISION OF ROUX LIMB;  Surgeon: Greer Pickerel, MD;  Location: [facility];  Service: General;  Laterality: N/A;  . LAPAROSCOPY N/A 09/30/2015   Procedure: LAPAROSCOPY DIAGNOSTIC;  Surgeon: Greer Pickerel, MD;  Location: [facility];  Service: General;  Laterality: N/A;  . TUBAL LIGATION    . WISDOM TOOTH EXTRACTION     Prior to Admission medications   Medication Sig Start Date End Date Taking? Authorizing Provider  albuterol (PROVENTIL HFA;VENTOLIN HFA) 108 (90 Base) MCG/ACT inhaler Inhale 2 puffs into the lungs every 6 (six) hours as needed for wheezing or shortness of breath. 03/12/17   Rutherford Guys, MD  amitriptyline (ELAVIL) 10 MG tablet Take 2 tablets (20 mg total) by mouth at bedtime. 09/14/16   Melvenia Beam, MD  amphetamine-dextroamphetamine (ADDERALL) 10 MG tablet Take 1 tablet (10 mg total) by mouth 2 (two) times daily. 02/02/17   Shawnee Knapp, MD  azithromycin (ZITHROMAX) 250 MG tablet Take 2 tabs PO x 1 dose, then 1 tab PO QD x 4 days 03/15/17   Shawnee Knapp, MD  CALCIUM-VITAMIN D PO Take 1 tablet by mouth 2 (two) times daily. chewable    [provider]  chlorpheniramine-HYDROcodone (TUSSIONEX PENNKINETIC ER) 10-8 MG/5ML SUER Take 5 mLs by mouth every 12 (twelve) hours as needed. 03/15/17   Shawnee Knapp, MD  clonazePAM (KLONOPIN) 0.5 MG tablet Take 1 tablet (0.5 mg total) by mouth 3 (three) times daily as needed for anxiety. 03/15/17   Shawnee Knapp, MD  Cyanocobalamin (VITAMIN B-12 SL) Place 1 drop under the tongue daily. 1 dropperful    [provider]  desvenlafaxine (PRISTIQ) 50 MG 24 hr tablet Take 1 tablet (50 mg total) by mouth daily. 03/15/17   Shawnee Knapp, MD  docusate sodium (COLACE) 100  MG capsule Take 100 mg by mouth daily.    [provider]  eletriptan (RELPAX) 40 MG tablet Take 1 tablet (40 mg total) by mouth every 2 (two) hours as needed for migraine or headache. Do not use >2 doses/24 hours 01/07/17   Shawnee Knapp, MD  hydrocortisone (ANUSOL-HC) 25 MG suppository Place 1 suppository (25 mg total) rectally 2 (two) times daily as needed for hemorrhoids. - after a bowel movement if possible 01/07/17   Shawnee Knapp, MD  ipratropium (ATROVENT) 0.03 % nasal spray Place 2 sprays into the nose 4 (four) times daily. 05/25/16   Shawnee Knapp, MD  lactulose (CHRONULAC) 10 GM/15ML solution Take 30 g by mouth daily as needed for moderate constipation.  01/14/16   [provider]  linaclotide Rolan Lipa) 145 MCG CAPS capsule Take 1 capsule (145 mcg total) by mouth daily before breakfast. As needed for chronic constipation 10/26/16   Shawnee Knapp, MD  metoCLOPramide (REGLAN) 5 MG tablet Take 1 tablet (5 mg total) by mouth 4 (four)  times daily -  before meals and at bedtime. 03/15/17   Shawnee Knapp, MD  Multiple Vitamins-Minerals (BARIATRIC MULTIVITAMINS/IRON PO) Take 1 tablet by mouth 3 (three) times daily.    [provider]  mupirocin ointment (BACTROBAN) 2 % Apply 1 application topically 3 (three) times daily. 02/12/17   Shawnee Knapp, MD  Nutritional Supplements (FEEDING SUPPLEMENT, VITAL HIGH PROTEIN,) LIQD liquid Place 1,000 mLs into feeding tube daily. 91ml/hr for 8hrs a day as needed for poor oral intake; 300 ml free water q4hr Patient taking differently: Place 1,000 mLs into feeding tube daily. 48ml/hr for 8hrs a day as needed for poor oral intake; 300 ml free water q4hr 02/24/16   Greer Pickerel, MD  ondansetron (ZOFRAN) 4 MG tablet Take 1 tablet (4 mg total) by mouth every 6 (six) hours. Patient taking differently: Take 4 mg every 8 (eight) hours as needed by mouth for nausea or vomiting.  12/27/16   Varney Biles, MD  ondansetron (ZOFRAN-ODT) 4 MG disintegrating tablet Take  1 tablet (4 mg total) by mouth every 6 (six) hours as needed for nausea. 02/24/16   Greer Pickerel, MD  oxyCODONE (OXY IR/ROXICODONE) 5 MG immediate release tablet Take 1 tablet (5 mg total) by mouth 2 (two) times daily as needed for moderate pain. 04/08/17   Shawnee Knapp, MD  pantoprazole (PROTONIX) 40 MG tablet Take 40 mg by mouth daily.    [provider]  polyethylene glycol (MIRALAX / GLYCOLAX) packet Take 17 g by mouth daily as needed (for constipation.).     [provider]  simethicone (MYLICON) 80 MG chewable tablet Chew 80 mg by mouth every 6 (six) hours as needed for flatulence.    [provider]  topiramate (TOPAMAX) 50 MG tablet Take 1.5 tablets (75 mg total) by mouth daily. 02/22/17   Ward Givens, NP  [name] 150-35 MCG/24HR transdermal patch Place 1 patch onto the skin every Sunday.  02/11/16   [provider]   Allergies  Allergen Reactions  . Sulfa Antibiotics Anaphylaxis  . Zosyn [Piperacillin Sod-Tazobactam So] Rash    Has patient had a PCN reaction causing immediate rash, facial/tongue/throat swelling, SOB or lightheadedness with hypotension: No Has patient had a PCN reaction causing severe rash involving mucus membranes or skin necrosis: No Has patient had a PCN reaction that required hospitalization: Unknown--inpatient when reaction occurred Has patient had a PCN reaction occurring within the last 10 years: Yes If all of the above answers are "NO", then may proceed with Cephalosporin use.    Family History  Problem Relation Age of Onset  . Cancer Mother   . Stroke Father   . Diabetes Brother   . Seizures Son   . Migraines Neg Hx    Social History   Socioeconomic History  . Marital status: Divorced    Spouse name: None  . Number of children: 2  . Years of education: 85  . Highest education level: None  Social Needs  . Financial resource strain: None  . Food insecurity - worry: None  . Food insecurity - inability: None  .  Transportation needs - medical: None  . Transportation needs - non-medical: None  Occupational History  . 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 Smoking status: Never Smoker  . Smokeless tobacco: Never Used  Substance and Sexual Activity  . Alcohol use: Yes    Comment: socially  . Drug use: No  . Sexual activity: Yes    Birth control/protection:  Surgical  Other Topics Concern  . None  Social History Narrative   Lives with partner   Caffeine use: minimal coffee   Right-handed   Depression screen [facility] 2/9 03/22/2017 02/12/2017 01/20/2017 01/07/2017 10/26/2016  Decreased Interest 0 2 0 0 3  Down, Depressed, Hopeless 0 2 0 0 3  PHQ - 2 Score 0 4 0 0 6  Altered sleeping - 3 - - 2  Tired, decreased energy - 3 - - 2  Change in appetite - 3 - - 2  Feeling bad or failure about yourself  - 1 - - 0  Trouble concentrating - 2 - - 1  Moving slowly or fidgety/restless - 1 - - 3  Suicidal thoughts - 0 - - 0  PHQ-9 Score - 17 - - 16  Difficult doing work/chores - Somewhat difficult - - Extremely dIfficult  Some recent data might be hidden    Review of Systems  Constitutional: Positive for fatigue. Negative for unexpected weight change.  HENT: Positive for congestion, sinus pressure and sinus pain.   Respiratory: Positive for cough and wheezing.   Gastrointestinal: Negative for constipation, diarrhea, nausea and vomiting.  Skin: Negative for rash and wound.  Neurological: Negative for dizziness, weakness and headaches.       Objective:   Physical Exam  Constitutional: She is oriented to person, place, and time. She appears well-developed and well-nourished. No distress.  HENT:  Head: Normocephalic and atraumatic.  Eyes: EOM are normal. Pupils are equal, round, and reactive to light.  Neck: Neck supple.  Cardiovascular: Normal rate.  Pulmonary/Chest: Effort normal. No respiratory distress. She has decreased breath sounds. She has wheezes.  Decreased air movement at bases  with expiratory wheeze  Musculoskeletal: Normal range of motion.  Neurological: She is alert and oriented to person, place, and time.  Skin: Skin is warm and dry.  Psychiatric: She has a normal mood and affect. Her behavior is normal.  Nursing note and vitals reviewed.   BP 122/86   Pulse 86   Temp 98.6 F (37 C)   Resp 16   Ht 5\' 7"  (1.702 m)   Wt 187 lb (84.8 kg)   SpO2 100%   BMI 29.29 kg/m   Dg Chest 2 View  Result Date: 03/22/2017 CLINICAL DATA:  Two week history of wheezing. EXAM: CHEST  2 VIEW COMPARISON:  09/11/2015, 07/15/2015 and earlier. FINDINGS: Cardiomediastinal silhouette unremarkable, unchanged. Lungs clear. Bronchovascular markings normal. Pulmonary vascularity normal. No visible pleural effusions. No pneumothorax. Degenerative changes involving the thoracic spine. Gastrostomy tube in the left upper quadrant of the abdomen. IMPRESSION: No acute cardiopulmonary disease. Electronically Signed   By: Evangeline Dakin M.D.   On: 03/22/2017 17:07       Assessment & Plan:  Has Zosyn allergy with rash, but has tolerated cephalosporins several times in the past previously.   1. Wheezing   2. Protein-calorie malnutrition, moderate ([place])   3. Thrush   4. Sinobronchitis     Orders Placed This Encounter  Procedures  . DG Chest 2 View    Standing Status:   Future    Number of Occurrences:   1    Standing Expiration Date:   03/22/2018    Order Specific Question:   Reason for Exam (SYMPTOM  OR DIAGNOSIS REQUIRED)    Answer:   wheezing, URI sxs x 2 wks.    Order Specific Question:   Is the patient pregnant?    Answer:   No  Order Specific Question:   Preferred imaging location?    Answer:   External  . Comprehensive metabolic panel  . Sedimentation Rate  . POCT CBC  . POCT Skin KOH    Meds ordered this encounter  Medications  . DISCONTD: methylPREDNISolone acetate (DEPO-MEDROL) injection 80 mg  . albuterol (PROVENTIL) (2.5 MG/3ML) 0.083% nebulizer solution 2.5  mg  . ipratropium (ATROVENT) nebulizer solution 0.5 mg  . clotrimazole (MYCELEX) 10 MG troche    Sig: Take 1 tablet (10 mg total) by mouth 5 (five) times daily. Let dissolve in mouth    Dispense:  70 tablet    Refill:  0  . methylPREDNISolone acetate (DEPO-MEDROL) injection 80 mg    I personally performed the services described in this documentation, which was scribed in my presence. The recorded information has been reviewed and considered, and addended by me as needed.   [name], M.D.  Primary Care at [facility] [address] [place], [place] [address] [phone] phone [phone] fax  04/05/17 12:32 AM

## 2017-03-22 NOTE — Patient Instructions (Addendum)
   IF you received an x-ray today, you will receive an invoice from [facility]. Please contact  Radiology at [phone] with questions or concerns regarding your invoice.   IF you received labwork today, you will receive an invoice from [facility]. Please contact [facility] at [phone] with questions or concerns regarding your invoice.   Our billing staff will not be able to assist you with questions regarding bills from these companies.  You will be contacted with the lab results as soon as they are available. The fastest way to get your results is to activate your My Chart account. Instructions are located on the last page of this paperwork. If you have not heard from us regarding the results in 2 weeks, please contact this office.     Acute Bronchitis, Adult Acute bronchitis is sudden (acute) swelling of the air tubes (bronchi) in the lungs. Acute bronchitis causes these tubes to fill with mucus, which can make it hard to breathe. It can also cause coughing or wheezing. In adults, acute bronchitis usually goes away within 2 weeks. A cough caused by bronchitis may last up to 3 weeks. Smoking, allergies, and asthma can make the condition worse. Repeated episodes of bronchitis may cause further lung problems, such as chronic obstructive pulmonary disease (COPD). What are the causes? This condition can be caused by germs and by substances that irritate the lungs, including:  Cold and flu viruses. This condition is most often caused by the same virus that causes a cold.  Bacteria.  Exposure to tobacco smoke, dust, fumes, and air pollution.  What increases the risk? This condition is more likely to develop in people who:  Have close contact with someone with acute bronchitis.  Are exposed to lung irritants, such as tobacco smoke, dust, fumes, and vapors.  Have a weak immune system.  Have a respiratory condition such as asthma.  What are the signs or  symptoms? Symptoms of this condition include:  A cough.  Coughing up clear, yellow, or green mucus.  Wheezing.  Chest congestion.  Shortness of breath.  A fever.  Body aches.  Chills.  A sore throat.  How is this diagnosed? This condition is usually diagnosed with a physical exam. During the exam, your health care provider may order tests, such as chest X-rays, to rule out other conditions. He or she may also:  Test a sample of your mucus for bacterial infection.  Check the level of oxygen in your blood. This is done to check for pneumonia.  Do a chest X-ray or lung function testing to rule out pneumonia and other conditions.  Perform blood tests.  Your health care provider will also ask about your symptoms and medical history. How is this treated? Most cases of acute bronchitis clear up over time without treatment. Your health care provider may recommend:  Drinking more fluids. Drinking more makes your mucus thinner, which may make it easier to breathe.  Taking a medicine for a fever or cough.  Taking an antibiotic medicine.  Using an inhaler to help improve shortness of breath and to control a cough.  Using a cool mist vaporizer or humidifier to make it easier to breathe.  Follow these instructions at home: Medicines  Take over-the-counter and prescription medicines only as told by your health care provider.  If you were prescribed an antibiotic, take it as told by your health care provider. Do not stop taking the antibiotic even if you start to feel better. General instructions    Get plenty of rest.  Drink enough fluids to keep your urine clear or pale yellow.  Avoid smoking and secondhand smoke. Exposure to cigarette smoke or irritating chemicals will make bronchitis worse. If you smoke and you need help quitting, ask your health care provider. Quitting smoking will help your lungs heal faster.  Use an inhaler, cool mist vaporizer, or humidifier as told  by your health care provider.  Keep all follow-up visits as told by your health care provider. This is important. How is this prevented? To lower your risk of getting this condition again:  Wash your hands often with soap and water. If soap and water are not available, use hand sanitizer.  Avoid contact with people who have cold symptoms.  Try not to touch your hands to your mouth, nose, or eyes.  Make sure to get the flu shot every year.  Contact a health care provider if:  Your symptoms do not improve in 2 weeks of treatment. Get help right away if:  You cough up blood.  You have chest pain.  You have severe shortness of breath.  You become dehydrated.  You faint or keep feeling like you are going to faint.  You keep vomiting.  You have a severe headache.  Your fever or chills gets worse. This information is not intended to replace advice given to you by your health care provider. Make sure you discuss any questions you have with your health care provider. Document Released: 04/02/2004 Document Revised: 09/18/2015 Document Reviewed: 08/14/2015 Elsevier Interactive Patient Education  2018 Elsevier Inc.   

## 2017-03-23 LAB — COMPREHENSIVE METABOLIC PANEL
A/G RATIO: 1.4 (ref 1.2–2.2)
ALBUMIN: 4.1 g/dL (ref 3.5–5.5)
ALK PHOS: 64 IU/L (ref 39–117)
ALT: 21 IU/L (ref 0–32)
AST: 19 IU/L (ref 0–40)
BUN / CREAT RATIO: 13 (ref 9–23)
BUN: 17 mg/dL (ref 6–24)
Bilirubin Total: <0.2 mg/dL (ref 0.0–1.2)
CHLORIDE: 104 mmol/L (ref 96–106)
CO2: 23 mmol/L (ref 20–29)
Calcium: 8.8 mg/dL (ref 8.7–10.2)
Creatinine, Ser: 1.35 mg/dL — ABNORMAL HIGH (ref 0.57–1.00)
GFR calc non Af Amer: 47 mL/min/{1.73_m2} — ABNORMAL LOW (ref >59)
GFR, EST AFRICAN AMERICAN: 54 mL/min/{1.73_m2} — AB (ref >59)
GLOBULIN, TOTAL: 3 g/dL (ref 1.5–4.5)
Glucose: 54 mg/dL — ABNORMAL LOW (ref 65–99)
Potassium: 4.7 mmol/L (ref 3.5–5.2)
SODIUM: 138 mmol/L (ref 134–144)
TOTAL PROTEIN: 7.1 g/dL (ref 6.0–8.5)

## 2017-03-23 LAB — SEDIMENTATION RATE: SED RATE: 19 mm/h (ref 0–32)

## 2017-03-24 ENCOUNTER — Ambulatory Visit: Payer: Self-pay | Admitting: Psychiatry

## 2017-03-30 ENCOUNTER — Ambulatory Visit (INDEPENDENT_AMBULATORY_CARE_PROVIDER_SITE_OTHER): Payer: BLUE CROSS/BLUE SHIELD | Admitting: Psychiatry

## 2017-03-30 DIAGNOSIS — F4323 Adjustment disorder with mixed anxiety and depressed mood: Secondary | ICD-10-CM | POA: Diagnosis not present

## 2017-03-30 DIAGNOSIS — F900 Attention-deficit hyperactivity disorder, predominantly inattentive type: Secondary | ICD-10-CM | POA: Diagnosis not present

## 2017-04-01 ENCOUNTER — Ambulatory Visit: Payer: Self-pay

## 2017-04-05 ENCOUNTER — Ambulatory Visit: Payer: Self-pay | Admitting: Neurology

## 2017-04-08 ENCOUNTER — Other Ambulatory Visit: Payer: Self-pay | Admitting: Family Medicine

## 2017-04-08 MED FILL — DESVENLAFAXINE SUC ER 50 MG: 50 | 30 days supply | Qty: 30 | Fill #1

## 2017-04-08 MED FILL — DEXTROAMP-AMP 10 MG TAB: 10 | 30 days supply | Qty: 60 | Fill #0

## 2017-04-08 MED FILL — XULANE PATCH: 150-35 | 21 days supply | Qty: 3 | Fill #3

## 2017-04-08 NOTE — Telephone Encounter (Signed)
Requesting refill of Adderall  LOV 03/22/17 with Dr. Brigitte Pulse  [facility] 02/02/17 #[phone] 0 refills  [place], [place]

## 2017-04-09 MED FILL — oxyCODONE HCL 5 MG TABS: 5 | 30 days supply | Qty: 60 | Fill #0

## 2017-04-15 ENCOUNTER — Other Ambulatory Visit: Payer: Self-pay | Admitting: General Surgery

## 2017-04-15 ENCOUNTER — Ambulatory Visit: Payer: BLUE CROSS/BLUE SHIELD | Admitting: Family Medicine

## 2017-04-15 ENCOUNTER — Telehealth: Payer: Self-pay | Admitting: Family Medicine

## 2017-04-15 NOTE — Telephone Encounter (Signed)
Patient needs Disability forms completed for her recent illness. I have completed what I could from the [place] notes and highlighted the areas I was not sure about because I do not see an out of work note so I was not sure about the time or limitations. I will place the blank forms in Dr Raul Del box on 04/15/17 please return to the FMLA/Disability box within 5-7 business days. Thank you!

## 2017-04-16 ENCOUNTER — Ambulatory Visit (HOSPITAL_COMMUNITY)
Admission: RE | Admit: 2017-04-16 | Discharge: 2017-04-16 | Disposition: A | Discharge status: Home / Self Care | Payer: BLUE CROSS/BLUE SHIELD | Source: Ambulatory Visit | Attending: General Surgery | Admitting: General Surgery

## 2017-04-16 DIAGNOSIS — E860 Dehydration: Secondary | ICD-10-CM | POA: Diagnosis present

## 2017-04-16 LAB — COMPREHENSIVE METABOLIC PANEL
ALT: 19 U/L (ref 14–54)
AST: 24 U/L (ref 15–41)
Albumin: 2.9 g/dL — ABNORMAL LOW (ref 3.5–5.0)
Alkaline Phosphatase: 44 U/L (ref 38–126)
Anion gap: 5 (ref 5–15)
BILIRUBIN TOTAL: 0.2 mg/dL — AB (ref 0.3–1.2)
BUN: 18 mg/dL (ref 6–20)
CO2: 24 mmol/L (ref 22–32)
CREATININE: 1.08 mg/dL — AB (ref 0.44–1.00)
Calcium: 8.1 mg/dL — ABNORMAL LOW (ref 8.9–10.3)
Chloride: 109 mmol/L (ref 101–111)
Glucose, Bld: 74 mg/dL (ref 65–99)
Potassium: 4 mmol/L (ref 3.5–5.1)
Sodium: 138 mmol/L (ref 135–145)
TOTAL PROTEIN: 5.9 g/dL — AB (ref 6.5–8.1)

## 2017-04-16 MED ORDER — ACETAMINOPHEN 160 MG/5ML PO SOLN
325.0000 mg | ORAL | Status: DC | PRN
  Filled 2017-04-16: qty 20.3

## 2017-04-16 MED ORDER — MORPHINE SULFATE (PF) 2 MG/ML IV SOLN: 1.0000 mg | INTRAVENOUS | Status: DC | PRN

## 2017-04-16 MED ORDER — DIPHENHYDRAMINE HCL 50 MG/ML IJ SOLN: 12.5000 mg | INTRAMUSCULAR | Status: DC | PRN

## 2017-04-16 MED ORDER — DIPHENHYDRAMINE HCL 25 MG PO CAPS: 25.0000 mg | ORAL_CAPSULE | ORAL | Status: DC | PRN

## 2017-04-16 MED ORDER — ACETAMINOPHEN 325 MG PO TABS: 325.0000 mg | ORAL_TABLET | ORAL | Status: DC | PRN

## 2017-04-16 MED ORDER — SODIUM CHLORIDE 0.9 % IV SOLN: INTRAVENOUS | Status: DC

## 2017-04-16 MED ORDER — THIAMINE HCL 100 MG/ML IJ SOLN
INTRAVENOUS | Status: DC
  Administered 2017-04-16: 13:00:00 via INTRAVENOUS
  Filled 2017-04-16 (×4): qty 1000

## 2017-04-16 MED ORDER — SODIUM CHLORIDE 0.9 % IV BOLUS (SEPSIS)
1000.0000 mL | Freq: Once | INTRAVENOUS | Status: AC
  Administered 2017-04-16: 1000 mL via INTRAVENOUS

## 2017-04-16 MED ORDER — ONDANSETRON HCL 4 MG/2ML IJ SOLN: 4.0000 mg | INTRAMUSCULAR | Status: DC | PRN

## 2017-04-16 MED ORDER — ONDANSETRON 4 MG PO TBDP: 4.0000 mg | ORAL_TABLET | ORAL | Status: DC | PRN

## 2017-04-16 MED ORDER — ACETAMINOPHEN 325 MG RE SUPP
325.0000 mg | RECTAL | Status: DC | PRN
  Filled 2017-04-16: qty 2

## 2017-04-16 NOTE — Discharge Instructions (Signed)
Dehydration, Adult Dehydration is when there is not enough fluid or water in your body. This happens when you lose more fluids than you take in. Dehydration can range from mild to very bad. It should be treated right away to keep it from getting very bad. Symptoms of mild dehydration may include:  Thirst.  Dry lips.  Slightly dry mouth.  Dry, warm skin.  Dizziness. Symptoms of moderate dehydration may include:  Very dry mouth.  Muscle cramps.  Dark pee (urine). Pee may be the color of tea.  Your body making less pee.  Your eyes making fewer tears.  Heartbeat that is uneven or faster than normal (palpitations).  Headache.  Light-headedness, especially when you stand up from sitting.  Fainting (syncope). Symptoms of very bad dehydration may include:  Changes in skin, such as: ? Cold and clammy skin. ? Blotchy (mottled) or pale skin. ? Skin that does not quickly return to normal after being lightly pinched and let go (poor skin turgor).  Changes in body fluids, such as: ? Feeling very thirsty. ? Your eyes making fewer tears. ? Not sweating when body temperature is high, such as in hot weather. ? Your body making very little pee.  Changes in vital signs, such as: ? Weak pulse. ? Pulse that is more than 100 beats a minute when you are sitting still. ? Fast breathing. ? Low blood pressure.  Other changes, such as: ? Sunken eyes. ? Cold hands and feet. ? Confusion. ? Lack of energy (lethargy). ? Trouble waking up from sleep. ? Short-term weight loss. ? Unconsciousness. Follow these instructions at home:  If told by your doctor, drink an ORS: ? Make an ORS by using instructions on the package. ? Start by drinking small amounts, about  cup (120 mL) every 5-10 minutes. ? Slowly drink more until you have had the amount that your doctor said to have.  Drink enough clear fluid to keep your pee clear or pale yellow. If you were told to drink an ORS, finish the ORS  first, then start slowly drinking clear fluids. Drink fluids such as: ? Water. Do not drink only water by itself. Doing that can make the salt (sodium) level in your body get too low (hyponatremia). ? Ice chips. ? Fruit juice that you have added water to (diluted). ? Low-calorie sports drinks.  Avoid: ? Alcohol. ? Drinks that have a lot of sugar. These include high-calorie sports drinks, fruit juice that does not have water added, and soda. ? Caffeine. ? Foods that are greasy or have a lot of fat or sugar.  Take over-the-counter and prescription medicines only as told by your doctor.  Do not take salt tablets. Doing that can make the salt level in your body get too high (hypernatremia).  Eat foods that have minerals (electrolytes). Examples include bananas, oranges, potatoes, tomatoes, and spinach.  Keep all follow-up visits as told by your doctor. This is important. Contact a doctor if:  You have belly (abdominal) pain that: ? Gets worse. ? Stays in one area (localizes).  You have a rash.  You have a stiff neck.  You get angry or annoyed more easily than normal (irritability).  You are more sleepy than normal.  You have a harder time waking up than normal.  You feel: ? Weak. ? Dizzy. ? Very thirsty.  You have peed (urinated) only a small amount of very dark pee during 6-8 hours. Get help right away if:  You have symptoms of   very bad dehydration.  You cannot drink fluids without throwing up (vomiting).  Your symptoms get worse with treatment.  You have a fever.  You have a very bad headache.  You are throwing up or having watery poop (diarrhea) and it: ? Gets worse. ? Does not go away.  You have blood or something green (bile) in your throw-up.  You have blood in your poop (stool). This may cause poop to look black and tarry.  You have not peed in 6-8 hours.  You pass out (faint).  Your heart rate when you are sitting still is more than 100 beats a  minute.  You have trouble breathing. This information is not intended to replace advice given to you by your health care provider. Make sure you discuss any questions you have with your health care provider. Document Released: 12/20/2008 Document Revised: 09/13/2015 Document Reviewed: 04/19/2015 Elsevier Interactive Patient Education  2018 Elsevier Inc.  

## 2017-04-16 NOTE — Progress Notes (Signed)
Ordering Provider: Greer Pickerel, MD  Procedure: IV infusion of sodium chloride 0.9 % 1,000 mL with thiamine 287 mg, folic acid 1 mg, multivitamins adult 10 mL infusion and 1000 mL bolus  Patient tolerated procedure well. Discharge instructions given to patient and patient states an understanding. Patient alert, oriented, and ambulatory at time of discharge.

## 2017-04-20 ENCOUNTER — Other Ambulatory Visit: Payer: Self-pay | Admitting: General Surgery

## 2017-04-20 ENCOUNTER — Telehealth: Payer: Self-pay | Admitting: Neurology

## 2017-04-20 DIAGNOSIS — K56699 Other intestinal obstruction unspecified as to partial versus complete obstruction: Secondary | ICD-10-CM

## 2017-04-20 DIAGNOSIS — R109 Unspecified abdominal pain: Secondary | ICD-10-CM

## 2017-04-20 DIAGNOSIS — R140 Abdominal distension (gaseous): Secondary | ICD-10-CM

## 2017-04-20 DIAGNOSIS — Z9889 Other specified postprocedural states: Secondary | ICD-10-CM

## 2017-04-20 NOTE — Telephone Encounter (Signed)
Forms are VERY specific with what precisely pt can do - I cannot find any prior disability or FMLA forms that I completed on pt so would be best to do this in an OV. Called and LVM for pt asking if perhaps she could reschedule her appt with me that was on 2/20 to either 2/14, 2/15, 2/16 and request a 40 min slot or to be the last appt of the morning or evening blocks so that we have to to review med refills/medical issues as well as complete the forms.  Please call pt to see if she can resched her visit w/ me for later this week.  If pt is unable to come in earlier that 2/20, advised that she could call me on my cell [phone] or text so that we could try to complete the forms over the phone on 2/12 or 2/13 though my availability is quite limited as I do have some personal commitments on those days.

## 2017-04-20 NOTE — Telephone Encounter (Signed)
Called pt and left voicemail stating that her [place] form is ready but there is a $50 fee that needs to be paid prior to fill out.

## 2017-04-21 ENCOUNTER — Ambulatory Visit
Admission: RE | Admit: 2017-04-21 | Discharge: 2017-04-21 | Disposition: A | Discharge status: Home / Self Care | Payer: BLUE CROSS/BLUE SHIELD | Source: Ambulatory Visit | Attending: Plastic Surgery | Admitting: Plastic Surgery

## 2017-04-21 ENCOUNTER — Encounter: Payer: Self-pay | Admitting: Family Medicine

## 2017-04-21 DIAGNOSIS — Z139 Encounter for screening, unspecified: Secondary | ICD-10-CM

## 2017-04-22 ENCOUNTER — Other Ambulatory Visit: Payer: Self-pay | Admitting: Family Medicine

## 2017-04-22 DIAGNOSIS — N6452 Nipple discharge: Secondary | ICD-10-CM

## 2017-04-22 MED FILL — PANTOPRAZOLE SOD DR 40 MG T: 40 | 30 days supply | Qty: 30 | Fill #2

## 2017-04-22 MED FILL — ELETRIPTAN HBR 40 MG TABLET: 40 | 30 days supply | Qty: 10 | Fill #2

## 2017-04-22 MED FILL — METOCLOPRAMIDE 5 MG TABLET: 5 | 30 days supply | Qty: 120 | Fill #0

## 2017-04-22 MED FILL — LINZESS 145 MCG CAPSULE: 145 | 30 days supply | Qty: 30 | Fill #0

## 2017-04-27 ENCOUNTER — Telehealth: Payer: Self-pay | Admitting: Family Medicine

## 2017-04-27 NOTE — Telephone Encounter (Signed)
Called and left VM for pt trying to confirm apt tomorrow 04/28/17. Advised of time, building # and time policies. °

## 2017-04-28 ENCOUNTER — Ambulatory Visit: Payer: Self-pay

## 2017-04-28 ENCOUNTER — Other Ambulatory Visit: Payer: Self-pay

## 2017-04-28 ENCOUNTER — Ambulatory Visit: Payer: BLUE CROSS/BLUE SHIELD | Admitting: Family Medicine

## 2017-04-28 ENCOUNTER — Encounter: Payer: Self-pay | Admitting: Family Medicine

## 2017-04-28 VITALS — BP 124/82 | HR 74 | Temp 98.1°F | Resp 16 | Ht 67.0 in | Wt 195.0 lb

## 2017-04-28 DIAGNOSIS — R5382 Chronic fatigue, unspecified: Secondary | ICD-10-CM

## 2017-04-28 DIAGNOSIS — K912 Postsurgical malabsorption, not elsewhere classified: Secondary | ICD-10-CM

## 2017-04-28 DIAGNOSIS — O926 Galactorrhea: Secondary | ICD-10-CM

## 2017-04-28 DIAGNOSIS — R110 Nausea: Secondary | ICD-10-CM

## 2017-04-28 DIAGNOSIS — R601 Generalized edema: Secondary | ICD-10-CM

## 2017-04-28 DIAGNOSIS — E440 Moderate protein-calorie malnutrition: Secondary | ICD-10-CM

## 2017-04-28 DIAGNOSIS — N643 Galactorrhea not associated with childbirth: Secondary | ICD-10-CM

## 2017-04-28 DIAGNOSIS — K5909 Other constipation: Secondary | ICD-10-CM

## 2017-04-28 DIAGNOSIS — E860 Dehydration: Secondary | ICD-10-CM

## 2017-04-28 DIAGNOSIS — E236 Other disorders of pituitary gland: Secondary | ICD-10-CM | POA: Diagnosis not present

## 2017-04-28 DIAGNOSIS — R140 Abdominal distension (gaseous): Secondary | ICD-10-CM

## 2017-04-28 DIAGNOSIS — R6881 Early satiety: Secondary | ICD-10-CM | POA: Diagnosis not present

## 2017-04-28 DIAGNOSIS — K56699 Other intestinal obstruction unspecified as to partial versus complete obstruction: Secondary | ICD-10-CM

## 2017-04-28 DIAGNOSIS — R413 Other amnesia: Secondary | ICD-10-CM

## 2017-04-28 DIAGNOSIS — G8929 Other chronic pain: Secondary | ICD-10-CM

## 2017-04-28 DIAGNOSIS — R1013 Epigastric pain: Secondary | ICD-10-CM

## 2017-04-28 MED ORDER — AMPHETAMINE-DEXTROAMPHETAMINE 10 MG PO TABS: 10.0000 mg | ORAL_TABLET | Freq: Two times a day (BID) | ORAL | 0 refills | Status: DC

## 2017-04-28 MED ORDER — OXYCODONE HCL 5 MG PO TABS: 5.0000 mg | ORAL_TABLET | Freq: Two times a day (BID) | ORAL | 0 refills | Status: DC | PRN

## 2017-04-28 NOTE — Progress Notes (Signed)
Subjective:  By signing my name below, I, Margaret Fletcher, attest that this documentation has been prepared under the direction and in the presence of Margaret Cheadle, MD. Electronically Signed: [name], Margaret Fletcher. 04/28/2017 , 2:43 PM .  Patient was seen in Room 2 .   Patient ID: Margaret Fletcher, female    DOB: 08/04/1970, 47 y.o.   MRN: [id number] Chief Complaint  Patient presents with  . Follow-up    leg cramps and fluid retention    HPI Margaret Fletcher is a 47 y.o. female who presents to Primary Care at [facility] for follow up. She states her stomach has been really sensitive so she's only been having intake of fluids (protein shakes, and "mushy foods"). She notes might needing another infusion, most recent not too long ago. She's noticed a lot of fluid retention, where she's had gone up in pants size and cramping in her legs. She had a normal phosphorous, magnesium, and PTH 2 months prior. She also had a normal Vitamin D at 61 6 months prior.   Wt Readings from Last 3 Encounters:  04/28/17 195 lb (88.5 kg)  03/22/17 187 lb (84.8 kg)  03/15/17 182 lb (82.6 kg)   She also informs checking her Fletcher sugar with a monitor, but requires more test strips. Her sugar has been running in the 50s, and she can feel it with tremors/shakes, and feeling fatigue. She denies appetite suppressing by her Adderall- as she does feel hungry, just unable to eat solids due to abdominal pain.   She's still taking Reglan 4 times a day daily. She mentions her stool isn't hard, but it's hard to push out.   She also mentions noticing lactation from both sides 3 weeks ago. She informs it looks like milk, and denies any foul odor. She denies vaginal bleeding. She wears a Xulane birth control hormone patch.   She also has disability paperwork, but Dr. Redmond Pulling has filled them out as well.   Past Medical History:  Diagnosis Date  . Alopecia   . Anxiety   . Autoimmune disease ([place])    "Alopecia"  . Bacterial  vaginosis   . Cyst of spleen   . Depression   . Frequent UTI   . Hypertrophy of breast   . IIH (idiopathic intracranial hypertension) 2017   "related to headaches"  . Migraine headache    denies, ruled out - Propranolol.Using for headaches   Past Surgical History:  Procedure Laterality Date  . APPENDECTOMY    . BREAST REDUCTION SURGERY Bilateral 03/26/2015   Procedure: BILATERAL BREAST REDUCTION  ;  Surgeon: Youlanda Roys, MD;  Location: [facility];  Service: Plastics;  Laterality: Bilateral;  . BREATH TEK H PYLORI N/A 05/21/2014   Procedure: BREATH TEK H PYLORI;  Surgeon: Greer Pickerel, MD;  Location: [facility];  Service: General;  Laterality: N/A;  . CESAREAN SECTION    . CESAREAN SECTION    . CHOLECYSTECTOMY    . DILITATION & CURRETTAGE/HYSTROSCOPY WITH NOVASURE ABLATION N/A 07/19/2012   Procedure: HYSTEROSCOPY WITH NOVASURE ABLATION;  Surgeon: Farrel Gobble. Harrington Challenger, MD;  Location: [facility];  Service: Gynecology;  Laterality: N/A;  . ENDOMETRIAL ABLATION    . ESOPHAGOGASTRODUODENOSCOPY N/A 08/16/2015   Procedure: UPPER ESOPHAGOGASTRODUODENOSCOPY (EGD);  Surgeon: Greer Pickerel, MD;  Location: [facility];  Service: General;  Laterality: N/A;  . ESOPHAGOGASTRODUODENOSCOPY N/A 02/21/2016   Procedure: ESOPHAGOGASTRODUODENOSCOPY (EGD);  Surgeon: Greer Pickerel, MD;  Location: [facility];  Service: General;  Laterality: N/A;  .  ESOPHAGOGASTRODUODENOSCOPY N/A 02/24/2017   Procedure: ESOPHAGOGASTRODUODENOSCOPY (EGD);  Surgeon: Greer Pickerel, MD;  Location[facility];  Service: General;  Laterality: N/A;  . IR GENERIC HISTORICAL  10/04/2015   IR [place] Vivianne Master 10/04/2015 Markus Daft, MD [facility]  . IR GENERIC HISTORICAL  01/31/2016   IR [place] GASTRO/COLONIC TUBE PERCUT W/FLUORO 01/31/2016 Corrie Mckusick, DO [facility]  . IR GENERIC HISTORICAL  04/06/2016   IR [place] TUBE PERCUT W/FLUORO 04/06/2016 Arne Cleveland, MD [facility]  . IR  GENERIC HISTORICAL  05/14/2016   IR GASTRIC TUBE PERC CHG W/O IMG GUIDE 05/14/2016 Sandi Mariscal, MD [facility]  . LAPAROSCOPIC GASTROSTOMY N/A 09/30/2015   Procedure: LAPAROSCOPIC GASTROSTOMY TUBE PLACEMENT;  Surgeon: Greer Pickerel, MD;  Location: [facility];  Service: General;  Laterality: N/A;  . LAPAROSCOPIC ROUX-EN-Y GASTRIC BYPASS WITH HIATAL HERNIA REPAIR N/A 07/08/2015   Procedure: LAPAROSCOPIC ROUX-EN-Y GASTRIC BYPASS WITH HIATAL HERNIA REPAIR WITH UPPER ENDOSCOPY;  Surgeon: Greer Pickerel, MD;  Location: [facility];  Service: General;  Laterality: N/A;  . LAPAROSCOPIC SMALL BOWEL RESECTION N/A 09/30/2015   Procedure: LAPAROSCOPIC REVISION OF ROUX LIMB;  Surgeon: Greer Pickerel, MD;  Location: [facility];  Service: General;  Laterality: N/A;  . LAPAROSCOPY N/A 09/30/2015   Procedure: LAPAROSCOPY DIAGNOSTIC;  Surgeon: Greer Pickerel, MD;  Location: [facility];  Service: General;  Laterality: N/A;  . TUBAL LIGATION    . WISDOM TOOTH EXTRACTION     Prior to Admission medications   Medication Sig Start Date End Date Taking? Authorizing Provider  albuterol (PROVENTIL HFA;VENTOLIN HFA) 108 (90 Base) MCG/ACT inhaler Inhale 2 puffs into the lungs every 6 (six) hours as needed for wheezing or shortness of breath. Patient not taking: Reported on 04/28/2017 03/12/17   Rutherford Guys, MD  amitriptyline (ELAVIL) 10 MG tablet Take 2 tablets (20 mg total) by mouth at bedtime. 09/14/16   Melvenia Beam, MD  amphetamine-dextroamphetamine (ADDERALL) 10 MG tablet TAKE 1 TABLET BY MOUTH TWICE DAILY 04/08/17   Shawnee Knapp, MD  azithromycin (ZITHROMAX) 250 MG tablet Take 2 tabs PO x 1 dose, then 1 tab PO QD x 4 days Patient not taking: Reported on 04/28/2017 03/15/17   Shawnee Knapp, MD  CALCIUM-VITAMIN D PO Take 1 tablet by mouth 2 (two) times daily. chewable    [provider]  chlorpheniramine-HYDROcodone (TUSSIONEX PENNKINETIC ER) 10-8 MG/5ML SUER Take 5 mLs by mouth every 12 (twelve) hours as needed. 03/15/17   Shawnee Knapp, MD    clonazePAM (KLONOPIN) 0.5 MG tablet Take 1 tablet (0.5 mg total) by mouth 3 (three) times daily as needed for anxiety. 03/15/17   Shawnee Knapp, MD  clotrimazole (MYCELEX) 10 MG troche Take 1 tablet (10 mg total) by mouth 5 (five) times daily. Let dissolve in mouth 03/22/17   Shawnee Knapp, MD  Cyanocobalamin (VITAMIN B-12 SL) Place 1 drop under the tongue daily. 1 dropperful    [provider]  desvenlafaxine (PRISTIQ) 50 MG 24 hr tablet Take 1 tablet (50 mg total) by mouth daily. 03/15/17   Shawnee Knapp, MD  docusate sodium (COLACE) 100 MG capsule Take 100 mg by mouth daily.    [provider]  eletriptan (RELPAX) 40 MG tablet Take 1 tablet (40 mg total) by mouth every 2 (two) hours as needed for migraine or headache. Do not use >2 doses/24 hours 01/07/17   Shawnee Knapp, MD  hydrocortisone (ANUSOL-HC) 25 MG suppository Place 1 suppository (25 mg  total) rectally 2 (two) times daily as needed for hemorrhoids. - after a bowel movement if possible Patient not taking: Reported on 04/28/2017 01/07/17   Shawnee Knapp, MD  ipratropium (ATROVENT) 0.03 % nasal spray Place 2 sprays into the nose 4 (four) times daily. Patient not taking: Reported on 04/28/2017 05/25/16   Shawnee Knapp, MD  lactulose [facility]) 10 GM/15ML solution Take 30 g by mouth daily as needed for moderate constipation.  01/14/16   [provider]  linaclotide Rolan Lipa) 145 MCG CAPS capsule Take 1 capsule (145 mcg total) by mouth daily before breakfast. As needed for chronic constipation 10/26/16   Shawnee Knapp, MD  metoCLOPramide (REGLAN) 5 MG tablet Take 1 tablet (5 mg total) by mouth 4 (four) times daily -  before meals and at bedtime. 03/15/17   Shawnee Knapp, MD  Multiple Vitamins-Minerals (BARIATRIC MULTIVITAMINS/IRON PO) Take 1 tablet by mouth 3 (three) times daily.    [provider]  mupirocin ointment (BACTROBAN) 2 % Apply 1 application topically 3 (three) times daily. Patient not taking: Reported on 04/28/2017 02/12/17    Shawnee Knapp, MD  Nutritional Supplements (FEEDING SUPPLEMENT, VITAL HIGH PROTEIN,) LIQD liquid Place 1,000 mLs into feeding tube daily. 79ml/hr for 8hrs a day as needed for poor oral intake; 300 ml free water q4hr Patient taking differently: Place 1,000 mLs into feeding tube daily. 57ml/hr for 8hrs a day as needed for poor oral intake; 300 ml free water q4hr 02/24/16   Greer Pickerel, MD  ondansetron (ZOFRAN) 4 MG tablet Take 1 tablet (4 mg total) by mouth every 6 (six) hours. Patient taking differently: Take 4 mg every 8 (eight) hours as needed by mouth for nausea or vomiting.  12/27/16   Varney Biles, MD  ondansetron (ZOFRAN-ODT) 4 MG disintegrating tablet Take 1 tablet (4 mg total) by mouth every 6 (six) hours as needed for nausea. 02/24/16   Greer Pickerel, MD  oxyCODONE (OXY IR/ROXICODONE) 5 MG immediate release tablet Take 1 tablet (5 mg total) by mouth 2 (two) times daily as needed for moderate pain. 04/08/17   Shawnee Knapp, MD  pantoprazole (PROTONIX) 40 MG tablet Take 40 mg by mouth daily.    [provider]  polyethylene glycol (MIRALAX / GLYCOLAX) packet Take 17 g by mouth daily as needed (for constipation.).     [provider]  simethicone (MYLICON) 80 MG chewable tablet Chew 80 mg by mouth every 6 (six) hours as needed for flatulence.    [provider]  topiramate (TOPAMAX) 50 MG tablet Take 1.5 tablets (75 mg total) by mouth daily. 02/22/17   Ward Givens, NP  Margaret Fletcher 150-35 MCG/24HR transdermal patch Place 1 patch onto the skin every Sunday.  02/11/16   [provider]   Allergies  Allergen Reactions  . Sulfa Antibiotics Anaphylaxis  . Zosyn [Piperacillin Sod-Tazobactam So] Rash    Has patient had a PCN reaction causing immediate rash, facial/tongue/throat swelling, SOB or lightheadedness with hypotension: No Has patient had a PCN reaction causing severe rash involving mucus membranes or skin necrosis: No Has patient had a PCN reaction that  required hospitalization: Unknown--inpatient when reaction occurred Has patient had a PCN reaction occurring within the last 10 years: Yes If all of the above answers are "NO", then may proceed with Cephalosporin use.    Family History  Problem Relation Age of Onset  . Cancer Mother   . Stroke Father   . Diabetes Brother   . Seizures Son   .  Migraines Neg Hx    Social History   Socioeconomic History  . Marital status: Divorced    Spouse name: None  . Number of children: 2  . Years of education: 62  . Highest education level: None  Social Needs  . Financial resource strain: None  . Food insecurity - worry: None  . Food insecurity - inability: None  . Transportation needs - medical: None  . Transportation needs - non-medical: None  Occupational History  . 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 Smoking status: Never Smoker  . Smokeless tobacco: Never Used  Substance and Sexual Activity  . Alcohol use: Yes    Comment: socially  . Drug use: No  . Sexual activity: Yes    Birth control/protection: Surgical  Other Topics Concern  . None  Social History Narrative   Lives with partner   Caffeine use: minimal coffee   Right-handed   Depression screen [facility] 2/9 04/28/2017 03/22/2017 02/12/2017 01/20/2017 01/07/2017  Decreased Interest 0 0 2 0 0  Down, Depressed, Hopeless 0 0 2 0 0  PHQ - 2 Score 0 0 4 0 0  Altered sleeping - - 3 - -  Tired, decreased energy - - 3 - -  Change in appetite - - 3 - -  Feeling bad or failure about yourself  - - 1 - -  Trouble concentrating - - 2 - -  Moving slowly or fidgety/restless - - 1 - -  Suicidal thoughts - - 0 - -  PHQ-9 Score - - 17 - -  Difficult doing work/chores - - Somewhat difficult - -  Some recent data might be hidden    Review of Systems  Constitutional: Positive for fatigue. Negative for appetite change, chills, fever and unexpected weight change.  Respiratory: Negative for cough.   Cardiovascular: Positive for  leg swelling.  Gastrointestinal: Positive for abdominal pain. Negative for constipation, diarrhea, nausea and vomiting.  Genitourinary: Negative for vaginal bleeding.  Skin: Negative for rash and wound.  Neurological: Positive for tremors. Negative for dizziness, weakness and headaches.       Objective:   Physical Exam  Constitutional: She is oriented to person, place, and time. She appears well-developed and well-nourished. No distress.  HENT:  Head: Normocephalic and atraumatic.  Eyes: EOM are normal. Pupils are equal, round, and reactive to light.  Neck: Neck supple.  Cardiovascular: Normal rate.  Pulmonary/Chest: Effort normal. No respiratory distress.  Musculoskeletal: Normal range of motion. She exhibits edema (4+ pitting pedal edema).  Neurological: She is alert and oriented to person, place, and time.  Skin: Skin is warm and dry.  Psychiatric: She has a normal mood and affect. Her behavior is normal.  Nursing note and vitals reviewed.   BP 124/82   Pulse 74   Temp 98.1 F (36.7 C)   Resp 16   Ht 5\' 7"  (1.702 m)   Wt 195 lb (88.5 kg)   SpO2 100%   BMI 30.54 kg/m      Assessment & Plan:    1. Chronic fatigue   2. Empty sella syndrome ([place])   3. Inappropriate lactation - has diagnostic mammogram sched later today - check labs - poss due to partially empty sella from IIH???  4. Hypocalcemia   5. Hypoglycemia after GI (gastrointestinal) surgery - cbgs down to 50s. Needs refill on test strips so she can monitor cbgs but doesn't know meter type - will drop off info tomorrow  6. Constipation, chronic -  Consider the meds that block the gut opioid receptors?  7. Chronic nausea   8. Early satiety - Consider trying other GI motility stimulants or increasing Reglan? Pt has not seen GI and declines treatment for now as waiting on surgeons referral to tertiary care/specialty teaching hosp clinic where she will see a treatment team that includes GI  9. Abdominal bloating   10.  Protein-calorie malnutrition, moderate ([place])   11. Generalized edema - advised this is due to her recurrent need for IVF in setting of malnutrition just resulting in 3rd spacing - diuretics will only worsen sxs by removing intravascular fluids first so needs to manage with compression, elevation, lymph massage.  12. Dehydration - feels like she is going to need to go in to hosp for IVF from surgeon again soon but encouraged pt to hold off as long as poss due to edema - IVF are such a temporary fix - make her feel better for a few d but then ultimately feel worse due to bloating.  13. Memory loss due to medical condition - refilled adderall x 1 mo  14. Abdominal pain, chronic, epigastric - refilled oxycodone x 1 mo   I spent > an hour trying to complete pt's [employer] disability forms last week as I was told she needed them but had a few more questions and exam items to review w/ her at visit today.  However, pt then tells me that she doesn't know why the forms were sent here and she doesn't think that I need to complete them - they have been done by her surgeon as they were done in the past. She will get me copies of her the ones Dr. Redmond Pulling has already completed for her chart.  Orders Placed This Encounter  Procedures  . Prolactin  . TSH+T4F+T3Free  . Insulin-like growth factor  . ACTH  . Cortisol, urine, free    Meds ordered this encounter  Medications  . amphetamine-dextroamphetamine (ADDERALL) 10 MG tablet    Sig: Take 1 tablet (10 mg total) by mouth 2 (two) times daily.    Dispense:  60 tablet    Refill:  0  . oxyCODONE (OXY IR/ROXICODONE) 5 MG immediate release tablet    Sig: Take 1 tablet (5 mg total) by mouth 2 (two) times daily as needed for moderate pain.    Dispense:  60 tablet    Refill:  0   Today I have utilized the [place] Controlled Substance Registry's online query to confirm compliance regarding the patient's controlled medications. My review reveals appropriate prescription fills  and that I am the sole provider of these medications. Rechecks will occur regularly and the patient is aware of our use of the system.  I personally performed the services described in this documentation, which was scribed in my presence. The recorded information has been reviewed and considered, and addended by me as needed.   Margaret Fletcher, M.D.  Primary Care at [facility] [address] [place],  [address] [phone] phone [phone] fax  05/01/17 12:11 AM

## 2017-04-28 NOTE — Patient Instructions (Addendum)
IF you received an x-ray today, you will receive an invoice from [facility]. Please contact [facility] at [phone] with questions or concerns regarding your invoice.   IF you received labwork today, you will receive an invoice from [facility]. Please contact [facility] at [phone] with questions or concerns regarding your invoice.   Our billing staff will not be able to assist you with questions regarding bills from these companies.  You will be contacted with the lab results as soon as they are available. The fastest way to get your results is to activate your My Chart account. Instructions are located on the last page of this paperwork. If you have not heard from [name] regarding the results in 2 weeks, please contact this office.    Malnutrition Malnutrition is any condition in which nutrition is poor. There are many forms of malnutrition. A common form is having too little of one kind of nutrient (nutritional deficiency). Nutrients include proteins, minerals, carbohydrates, fats, and vitamins. They provide the body with energy and keep the body working normally. Malnutrition ranges from mild to severe. The condition affects the body's defense system (immune system). Because of this, people who are malnourished are more likely to develop health problems and get sick. What are the causes? Causes of malnutrition include:  Eating an unbalanced diet.  Eating too much of certain foods.  Eating too little.  Conditions that decrease the body's ability to use nutrients.  What increases the risk? Risk factors include:  Pregnancy and lactation. Women who are pregnant may become malnourished if they do not increase their nutrient intake. They are also susceptible to folic acid deficiency.  Increasing age. The body's ability to absorb nutrients decreases with age. This can contribute to iron, calcium, and vitamin D deficiencies.  Alcohol or drug dependency. Addiction  often leads to a lifestyle in which proper nourishment is ignored. Dependency can also hurt the metabolism and the body's ability to absorb nutrients. Alcoholism is a major cause of thiamine deficiency and can lead to deficiencies of magnesium, zinc, and other vitamins.  Eating disorders, such as anorexia nervosa. People with these disorders may eat too little or too much.  Chewing or swallowing problems. People with these disorders may not eat enough.  Certain diseases, including: ? Long-lasting (chronic) diseases. Chronic diseases tend to affect the absorption of calcium, iron, and vitamins B12, A, D, E, and K. ? Liver disease. Liver disease affects the storage of vitamins A and B12. It also interferes with the metabolism of protein and energy sources. ? Kidney disease. Kidney disease may cause deficiencies of protein, iron, and vitamin D. ? Cancer or AIDS. These diseases can cause a loss of appetite. ? Cystic fibrosis. This disease can make it difficult for the body to absorb nutrients.  Certain diets, including. ? The vegetarian diet. Vegetarians are at risk for iron deficiency. ? The vegan diet. Vegans are susceptible to vitamin B12, calcium, iron, vitamin D, and zinc deficiencies. ? The fruitarian diet. This diet can be deficient in protein, sodium, and many micronutrients. ? Many commercial "fad" diets, including those that claim to enhance well-being and reduce weight. ? Very low calorie diets.  Low income. People with a low income may have trouble paying for nutritious foods.  What are the signs or symptoms? Signs and symptoms depend on the kind of malnutrition you have. Common symptoms include:  Fatigue.  Weakness.  Dizziness.  Fainting  Weight loss.  Poor immune response.  Lack of menstruation.  Hair loss.  Poor memory.  How is this diagnosed? Malnutrition may be diagnosed by:  A medical history.  A dietary history.  A physical exam. This may include a  measurement of your body mass index (BMI).  Blood tests.  How is this treated? Treatments vary depending on the cause of the malnutrition. Common treatments include:  Dietary changes.  Dietary supplements, such as vitamins and minerals.  Treatment of any underlying conditions.  Follow these instructions at home:  Eat a balanced diet.  Take dietary supplements as directed by your health care provider.  Exercise regularly. Exercising can improve appetite.  Keep all follow-up visits as directed by your health care provider. This is important. How is this prevented? Eating a well-balanced diet helps to prevent most forms of malnutrition. Contact a health care provider if:  You have increased weakness or fatigue.  You faint.  You stop menstruating.  You have rapid hair loss.  You have unexpected weight loss. This information is not intended to replace advice given to you by your health care provider. Make sure you discuss any questions you have with your health care provider. Document Released: 01/09/2005 Document Revised: 08/01/2015 Document Reviewed: 10/20/2013 Elsevier Interactive Patient Education  [employer].

## 2017-04-29 ENCOUNTER — Other Ambulatory Visit: Payer: Self-pay | Admitting: General Surgery

## 2017-04-29 ENCOUNTER — Other Ambulatory Visit: Payer: Self-pay | Admitting: Family Medicine

## 2017-04-29 ENCOUNTER — Ambulatory Visit
Admission: RE | Admit: 2017-04-29 | Discharge: 2017-04-29 | Disposition: A | Discharge status: Home / Self Care | Payer: BLUE CROSS/BLUE SHIELD | Source: Ambulatory Visit | Attending: Family Medicine | Admitting: Family Medicine

## 2017-04-29 ENCOUNTER — Ambulatory Visit: Payer: Self-pay

## 2017-04-29 ENCOUNTER — Ambulatory Visit: Payer: BLUE CROSS/BLUE SHIELD

## 2017-04-29 DIAGNOSIS — N6452 Nipple discharge: Secondary | ICD-10-CM

## 2017-04-29 DIAGNOSIS — N6320 Unspecified lump in the left breast, unspecified quadrant: Secondary | ICD-10-CM

## 2017-04-29 LAB — TSH+T4F+T3FREE
Free T4: 1.05 ng/dL (ref 0.82–1.77)
T3, Free: 2 pg/mL (ref 2.0–4.4)
TSH: 1.21 u[IU]/mL (ref 0.450–4.500)

## 2017-04-29 LAB — ACTH: ACTH: 16.3 pg/mL (ref 7.2–63.3)

## 2017-04-29 LAB — INSULIN-LIKE GROWTH FACTOR: INSULIN LIKE GF 1: 128 ng/mL (ref 57–195)

## 2017-04-29 LAB — PROLACTIN: PROLACTIN: 136.2 ng/mL — AB (ref 4.8–23.3)

## 2017-04-30 ENCOUNTER — Telehealth: Payer: Self-pay | Admitting: Family Medicine

## 2017-04-30 ENCOUNTER — Ambulatory Visit (HOSPITAL_COMMUNITY)
Admission: RE | Admit: 2017-04-30 | Discharge: 2017-04-30 | Disposition: A | Discharge status: Home / Self Care | Payer: BLUE CROSS/BLUE SHIELD | Source: Ambulatory Visit | Attending: General Surgery | Admitting: General Surgery

## 2017-04-30 DIAGNOSIS — E860 Dehydration: Secondary | ICD-10-CM | POA: Diagnosis not present

## 2017-04-30 DIAGNOSIS — R110 Nausea: Secondary | ICD-10-CM | POA: Diagnosis present

## 2017-04-30 MED ORDER — ACETAMINOPHEN 325 MG PO TABS: 325.0000 mg | ORAL_TABLET | ORAL | Status: DC | PRN

## 2017-04-30 MED ORDER — ONDANSETRON HCL 4 MG/2ML IJ SOLN
4.0000 mg | INTRAMUSCULAR | Status: DC | PRN
  Administered 2017-04-30: 4 mg via INTRAVENOUS
  Filled 2017-04-30: qty 2

## 2017-04-30 MED ORDER — ONDANSETRON 4 MG PO TBDP: 4.0000 mg | ORAL_TABLET | ORAL | Status: DC | PRN

## 2017-04-30 MED ORDER — SODIUM CHLORIDE 0.9 % IV BOLUS (SEPSIS)
1000.0000 mL | Freq: Once | INTRAVENOUS | Status: AC
  Administered 2017-04-30: 1000 mL via INTRAVENOUS

## 2017-04-30 MED ORDER — ACETAMINOPHEN 160 MG/5ML PO SOLN
325.0000 mg | ORAL | Status: DC | PRN
  Filled 2017-04-30: qty 20.3

## 2017-04-30 MED ORDER — DIPHENHYDRAMINE HCL 50 MG/ML IJ SOLN: 12.5000 mg | INTRAMUSCULAR | Status: DC | PRN

## 2017-04-30 MED ORDER — DIPHENHYDRAMINE HCL 25 MG PO CAPS: 25.0000 mg | ORAL_CAPSULE | ORAL | Status: DC | PRN

## 2017-04-30 MED ORDER — SODIUM CHLORIDE 0.9 % IV SOLN: INTRAVENOUS | Status: DC

## 2017-04-30 MED ORDER — ACETAMINOPHEN 325 MG RE SUPP
325.0000 mg | RECTAL | Status: DC | PRN
  Filled 2017-04-30: qty 2

## 2017-04-30 MED FILL — XULANE PATCH: 150-35 | 21 days supply | Qty: 3 | Fill #4

## 2017-04-30 MED FILL — AMITRIPTYLINE HCL 10 MG TAB: 10 | 30 days supply | Qty: 60 | Fill #4

## 2017-04-30 MED FILL — TOPIRAMATE 50 MG TABLET: 50 | 30 days supply | Qty: 45 | Fill #1

## 2017-04-30 NOTE — Telephone Encounter (Signed)
Pt came in wanting to request a refill of for her glucose strips brand is [employer] next. Pt also thinks she received the wrong lancets for her glucose meter. Pt would like a call. The number on file is correct [phone].

## 2017-04-30 NOTE — Telephone Encounter (Signed)
Saw pt in OV - stated the her surgeon Dr. Redmond Pulling has already completed these disability forms for her and does not know if I also need to complete them or not. For some reason, myself and Dr. Jaynee Eagles, her neurologist, also received them this yr which has not happened prior and she did not initiate this.  Pt elects to drop off a copy of the form that Dr. Redmond Pulling has completed to ensure that I have complete information on her restrictions/abilities since many of the physical ones are due to her surgical hx/g-tube managed by Dr. Redmond Pulling. AFTER I receive these, I can then complete the forms to be submitted to [employer] in case they now want documentation from all her providers.

## 2017-04-30 NOTE — Discharge Instructions (Signed)
Dehydration, Adult Dehydration is when there is not enough fluid or water in your body. This happens when you lose more fluids than you take in. Dehydration can range from mild to very bad. It should be treated right away to keep it from getting very bad. Symptoms of mild dehydration may include:  Thirst.  Dry lips.  Slightly dry mouth.  Dry, warm skin.  Dizziness. Symptoms of moderate dehydration may include:  Very dry mouth.  Muscle cramps.  Dark pee (urine). Pee may be the color of tea.  Your body making less pee.  Your eyes making fewer tears.  Heartbeat that is uneven or faster than normal (palpitations).  Headache.  Light-headedness, especially when you stand up from sitting.  Fainting (syncope). Symptoms of very bad dehydration may include:  Changes in skin, such as: ? Cold and clammy skin. ? Blotchy (mottled) or pale skin. ? Skin that does not quickly return to normal after being lightly pinched and let go (poor skin turgor).  Changes in body fluids, such as: ? Feeling very thirsty. ? Your eyes making fewer tears. ? Not sweating when body temperature is high, such as in hot weather. ? Your body making very little pee.  Changes in vital signs, such as: ? Weak pulse. ? Pulse that is more than 100 beats a minute when you are sitting still. ? Fast breathing. ? Low blood pressure.  Other changes, such as: ? Sunken eyes. ? Cold hands and feet. ? Confusion. ? Lack of energy (lethargy). ? Trouble waking up from sleep. ? Short-term weight loss. ? Unconsciousness. Follow these instructions at home:  If told by your doctor, drink an ORS: ? Make an ORS by using instructions on the package. ? Start by drinking small amounts, about  cup (120 mL) every 5-10 minutes. ? Slowly drink more until you have had the amount that your doctor said to have.  Drink enough clear fluid to keep your pee clear or pale yellow. If you were told to drink an ORS, finish the ORS  first, then start slowly drinking clear fluids. Drink fluids such as: ? Water. Do not drink only water by itself. Doing that can make the salt (sodium) level in your body get too low (hyponatremia). ? Ice chips. ? Fruit juice that you have added water to (diluted). ? Low-calorie sports drinks.  Avoid: ? Alcohol. ? Drinks that have a lot of sugar. These include high-calorie sports drinks, fruit juice that does not have water added, and soda. ? Caffeine. ? Foods that are greasy or have a lot of fat or sugar.  Take over-the-counter and prescription medicines only as told by your doctor.  Do not take salt tablets. Doing that can make the salt level in your body get too high (hypernatremia).  Eat foods that have minerals (electrolytes). Examples include bananas, oranges, potatoes, tomatoes, and spinach.  Keep all follow-up visits as told by your doctor. This is important. Contact a doctor if:  You have belly (abdominal) pain that: ? Gets worse. ? Stays in one area (localizes).  You have a rash.  You have a stiff neck.  You get angry or annoyed more easily than normal (irritability).  You are more sleepy than normal.  You have a harder time waking up than normal.  You feel: ? Weak. ? Dizzy. ? Very thirsty.  You have peed (urinated) only a small amount of very dark pee during 6-8 hours. Get help right away if:  You have symptoms of  very bad dehydration.  You cannot drink fluids without throwing up (vomiting).  Your symptoms get worse with treatment.  You have a fever.  You have a very bad headache.  You are throwing up or having watery poop (diarrhea) and it: ? Gets worse. ? Does not go away.  You have blood or something green (bile) in your throw-up.  You have blood in your poop (stool). This may cause poop to look black and tarry.  You have not peed in 6-8 hours.  You pass out (faint).  Your heart rate when you are sitting still is more than 100 beats a  minute.  You have trouble breathing. This information is not intended to replace advice given to you by your health care provider. Make sure you discuss any questions you have with your health care provider. Document Released: 12/20/2008 Document Revised: 09/13/2015 Document Reviewed: 04/19/2015 Elsevier Interactive Patient Education  2018 [employer].   Patient received 1 Liter Bolus of Normal Saline.

## 2017-04-30 NOTE — Progress Notes (Addendum)
Ordering Provider: Greer Pickerel, MD  Procedure: IV infusion of sodium chloride 0.9 %   Associated Diagnosis: Fluid replacement  Patient tolerated procedure well. Discharge instructions given to patient and patient states an understanding. Patient alert, oriented, and ambulatory at time of discharge

## 2017-05-01 DIAGNOSIS — E236 Other disorders of pituitary gland: Secondary | ICD-10-CM | POA: Insufficient documentation

## 2017-05-01 DIAGNOSIS — K912 Postsurgical malabsorption, not elsewhere classified: Secondary | ICD-10-CM | POA: Insufficient documentation

## 2017-05-01 NOTE — Telephone Encounter (Signed)
Please order and send to me for co-sig. For both the strips and lancets will be: Sig: check cbgs tid due to Hypoglycemia after GI surgery K91.2 Disp 270; Ref 3  Lancets are specific for type of meter so might need to check w/ pt to see what type she wants.

## 2017-05-04 ENCOUNTER — Other Ambulatory Visit: Payer: Self-pay | Admitting: Family Medicine

## 2017-05-04 DIAGNOSIS — N6320 Unspecified lump in the left breast, unspecified quadrant: Secondary | ICD-10-CM

## 2017-05-05 ENCOUNTER — Ambulatory Visit
Admission: RE | Admit: 2017-05-05 | Discharge: 2017-05-05 | Disposition: A | Discharge status: Home / Self Care | Payer: BLUE CROSS/BLUE SHIELD | Source: Ambulatory Visit | Attending: Family Medicine | Admitting: Family Medicine

## 2017-05-05 ENCOUNTER — Other Ambulatory Visit: Payer: Self-pay | Admitting: Family Medicine

## 2017-05-05 DIAGNOSIS — N6320 Unspecified lump in the left breast, unspecified quadrant: Secondary | ICD-10-CM

## 2017-05-05 MED FILL — ONDANSETRON ODT 4 MG TABLET: 4 | 5 days supply | Qty: 20 | Fill #0

## 2017-05-06 ENCOUNTER — Other Ambulatory Visit: Payer: Self-pay | Admitting: Family Medicine

## 2017-05-06 ENCOUNTER — Ambulatory Visit (INDEPENDENT_AMBULATORY_CARE_PROVIDER_SITE_OTHER): Payer: BLUE CROSS/BLUE SHIELD | Admitting: Psychiatry

## 2017-05-06 DIAGNOSIS — F4323 Adjustment disorder with mixed anxiety and depressed mood: Secondary | ICD-10-CM | POA: Diagnosis not present

## 2017-05-06 DIAGNOSIS — F900 Attention-deficit hyperactivity disorder, predominantly inattentive type: Secondary | ICD-10-CM

## 2017-05-06 LAB — CORTISOL, URINE, FREE
CORTISOL (UR), FREE: 14 ug/(24.h) (ref 0–50)
Cortisol,F,ug/L,U: 6 ug/L

## 2017-05-07 MED FILL — oxyCODONE HCL 5 MG TABS: 5 | 30 days supply | Qty: 60 | Fill #0

## 2017-05-07 MED FILL — DESVENLAFAXINE SUC ER 50 MG: 50 | 30 days supply | Qty: 30 | Fill #2

## 2017-05-07 MED FILL — AMPHETAMINE-DEXTROAMPHETAMI: 10 | 30 days supply | Qty: 60 | Fill #0

## 2017-05-11 ENCOUNTER — Encounter: Payer: Self-pay | Admitting: Family Medicine

## 2017-05-11 NOTE — Telephone Encounter (Signed)
Patient will be calling back with the name of machine to order the lancets.   Please order lancets with message below from Dr. Brigitte Pulse.

## 2017-05-12 ENCOUNTER — Telehealth: Payer: Self-pay | Admitting: Family Medicine

## 2017-05-12 NOTE — Telephone Encounter (Signed)
So I called [employer] and asked them if we needed to complete these forms since her surgeon and her neurologist was already completing them. They stated that they need [name] to as well, so I have completed what I could from the [place] notes and highlighted what I was not sure about. I will place the forms in Dr Raul Del box on 05/13/17 please complete the forms and return them to the FMLA/Disability box at the [address] checkout desk within 5-7 business days. Thank you!

## 2017-05-21 ENCOUNTER — Other Ambulatory Visit: Payer: Self-pay

## 2017-05-21 NOTE — Telephone Encounter (Signed)
LMOVM for pt to call back with type of test strips needed for her machine. Then we can order lancets and test strips.    Note: she has Development worker, international aid - they will pay for IGLUCOSE test strips.

## 2017-05-25 MED FILL — XULANE PATCH: 150-35 | 21 days supply | Qty: 3 | Fill #0

## 2017-05-25 NOTE — Telephone Encounter (Signed)
Have we completed these forms yet?

## 2017-06-04 ENCOUNTER — Other Ambulatory Visit: Payer: Self-pay | Admitting: General Surgery

## 2017-06-04 NOTE — Telephone Encounter (Signed)
I have not seen these forms since I place them in Dr Raul Del box on 05/13/17 can someone please let me know if these forms have been completed and if so can they be placed in my box at [address] checkout.

## 2017-06-07 ENCOUNTER — Ambulatory Visit (HOSPITAL_COMMUNITY)
Admission: RE | Admit: 2017-06-07 | Discharge: 2017-06-07 | Disposition: A | Discharge status: Home / Self Care | Payer: BLUE CROSS/BLUE SHIELD | Source: Ambulatory Visit | Attending: General Surgery | Admitting: General Surgery

## 2017-06-07 DIAGNOSIS — E860 Dehydration: Secondary | ICD-10-CM | POA: Diagnosis present

## 2017-06-07 DIAGNOSIS — R1013 Epigastric pain: Secondary | ICD-10-CM | POA: Diagnosis not present

## 2017-06-07 DIAGNOSIS — G8929 Other chronic pain: Secondary | ICD-10-CM | POA: Diagnosis present

## 2017-06-07 LAB — COMPREHENSIVE METABOLIC PANEL
ALT: 23 U/L (ref 14–54)
ANION GAP: 8 (ref 5–15)
AST: 25 U/L (ref 15–41)
Albumin: 3.3 g/dL — ABNORMAL LOW (ref 3.5–5.0)
Alkaline Phosphatase: 54 U/L (ref 38–126)
BILIRUBIN TOTAL: 0.4 mg/dL (ref 0.3–1.2)
BUN: 22 mg/dL — ABNORMAL HIGH (ref 6–20)
CO2: 23 mmol/L (ref 22–32)
Calcium: 8.6 mg/dL — ABNORMAL LOW (ref 8.9–10.3)
Chloride: 107 mmol/L (ref 101–111)
Creatinine, Ser: 1.26 mg/dL — ABNORMAL HIGH (ref 0.44–1.00)
GFR, EST AFRICAN AMERICAN: 58 mL/min — AB (ref >60)
GFR, EST NON AFRICAN AMERICAN: 50 mL/min — AB (ref >60)
Glucose, Bld: 78 mg/dL (ref 65–99)
POTASSIUM: 3.7 mmol/L (ref 3.5–5.1)
Sodium: 138 mmol/L (ref 135–145)
TOTAL PROTEIN: 7.1 g/dL (ref 6.5–8.1)

## 2017-06-07 MED ORDER — ONDANSETRON HCL 4 MG/2ML IJ SOLN: 4.0000 mg | INTRAMUSCULAR | Status: DC | PRN

## 2017-06-07 MED ORDER — THIAMINE HCL 100 MG/ML IJ SOLN
Freq: Once | INTRAVENOUS | Status: AC
  Administered 2017-06-07: 10:00:00 via INTRAVENOUS
  Filled 2017-06-07: qty 1000

## 2017-06-07 MED ORDER — ONDANSETRON 4 MG PO TBDP: 4.0000 mg | ORAL_TABLET | ORAL | Status: DC | PRN

## 2017-06-07 MED ORDER — SODIUM CHLORIDE 0.9 % IV BOLUS
1000.0000 mL | Freq: Once | INTRAVENOUS | Status: AC
  Administered 2017-06-07: 1000 mL via INTRAVENOUS

## 2017-06-07 MED ORDER — SODIUM CHLORIDE 0.9 % IV SOLN: INTRAVENOUS | Status: DC

## 2017-06-07 NOTE — Discharge Instructions (Signed)
Today you received a Banana Bag (Thiamine, folic acid and multivitamins) and a 1 liter bolus of 0.9% Normal Saline.    Dehydration, Adult Dehydration is when there is not enough fluid or water in your body. This happens when you lose more fluids than you take in. Dehydration can range from mild to very bad. It should be treated right away to keep it from getting very bad. Symptoms of mild dehydration may include:  Thirst.  Dry lips.  Slightly dry mouth.  Dry, warm skin.  Dizziness. Symptoms of moderate dehydration may include:  Very dry mouth.  Muscle cramps.  Dark pee (urine). Pee may be the color of tea.  Your body making less pee.  Your eyes making fewer tears.  Heartbeat that is uneven or faster than normal (palpitations).  Headache.  Light-headedness, especially when you stand up from sitting.  Fainting (syncope). Symptoms of very bad dehydration may include:  Changes in skin, such as: ? Cold and clammy skin. ? Blotchy (mottled) or pale skin. ? Skin that does not quickly return to normal after being lightly pinched and let go (poor skin turgor).  Changes in body fluids, such as: ? Feeling very thirsty. ? Your eyes making fewer tears. ? Not sweating when body temperature is high, such as in hot weather. ? Your body making very little pee.  Changes in vital signs, such as: ? Weak pulse. ? Pulse that is more than 100 beats a minute when you are sitting still. ? Fast breathing. ? Low blood pressure.  Other changes, such as: ? Sunken eyes. ? Cold hands and feet. ? Confusion. ? Lack of energy (lethargy). ? Trouble waking up from sleep. ? Short-term weight loss. ? Unconsciousness. Follow these instructions at home:  If told by your doctor, drink an ORS: ? Make an ORS by using instructions on the package. ? Start by drinking small amounts, about  cup (120 mL) every 5-10 minutes. ? Slowly drink more until you have had the amount that your doctor  said to have.  Drink enough clear fluid to keep your pee clear or pale yellow. If you were told to drink an ORS, finish the ORS first, then start slowly drinking clear fluids. Drink fluids such as: ? Water. Do not drink only water by itself. Doing that can make the salt (sodium) level in your body get too low (hyponatremia). ? Ice chips. ? Fruit juice that you have added water to (diluted). ? Low-calorie sports drinks.  Avoid: ? Alcohol. ? Drinks that have a lot of sugar. These include high-calorie sports drinks, fruit juice that does not have water added, and soda. ? Caffeine. ? Foods that are greasy or have a lot of fat or sugar.  Take over-the-counter and prescription medicines only as told by your doctor.  Do not take salt tablets. Doing that can make the salt level in your body get too high (hypernatremia).  Eat foods that have minerals (electrolytes). Examples include bananas, oranges, potatoes, tomatoes, and spinach.  Keep all follow-up visits as told by your doctor. This is important. Contact a doctor if:  You have belly (abdominal) pain that: ? Gets worse. ? Stays in one area (localizes).  You have a rash.  You have a stiff neck.  You get angry or annoyed more easily than normal (irritability).  You are more sleepy than normal.  You have a harder time waking up than normal.  You feel: ? Weak. ? Dizzy. ? Very thirsty.  You  have peed (urinated) only a small amount of very dark pee during 6-8 hours. Get help right away if:  You have symptoms of very bad dehydration.  You cannot drink fluids without throwing up (vomiting).  Your symptoms get worse with treatment.  You have a fever.  You have a very bad headache.  You are throwing up or having watery poop (diarrhea) and it: ? Gets worse. ? Does not go away.  You have blood or something green (bile) in your throw-up.  You have blood in your poop (stool). This may cause poop to look black and  tarry.  You have not peed in 6-8 hours.  You pass out (faint).  Your heart rate when you are sitting still is more than 100 beats a minute.  You have trouble breathing. This information is not intended to replace advice given to you by your health care provider. Make sure you discuss any questions you have with your health care provider. Document Released: 12/20/2008 Document Revised: 09/13/2015 Document Reviewed: 04/19/2015 Elsevier Interactive Patient Education  2018 [employer].

## 2017-06-07 NOTE — Progress Notes (Signed)
PAT[facility]ARE [facility] NOTE  Diagnosis: Dehydration   Provider: Greer Pickerel MD   Note: Patient received 1L of Banana bag and 1L of NS via PIV. Tolerated well, vitals stable, discharge instructions given, verbalized understanding. Patient alert, oriented and ambulatory at the time of discharge.

## 2017-06-07 NOTE — Telephone Encounter (Signed)
I have them, will try my best to do them today

## 2017-06-08 NOTE — Telephone Encounter (Signed)
Did we get these completed?

## 2017-06-10 ENCOUNTER — Encounter: Payer: Self-pay | Admitting: Family Medicine

## 2017-06-10 ENCOUNTER — Ambulatory Visit: Payer: BLUE CROSS/BLUE SHIELD | Admitting: Family Medicine

## 2017-06-10 ENCOUNTER — Other Ambulatory Visit: Payer: Self-pay

## 2017-06-10 VITALS — BP 127/84 | HR 72 | Temp 99.2°F | Resp 16 | Ht 66.75 in | Wt 197.7 lb

## 2017-06-10 DIAGNOSIS — R5382 Chronic fatigue, unspecified: Secondary | ICD-10-CM

## 2017-06-10 DIAGNOSIS — G8929 Other chronic pain: Secondary | ICD-10-CM

## 2017-06-10 DIAGNOSIS — K5909 Other constipation: Secondary | ICD-10-CM

## 2017-06-10 DIAGNOSIS — G932 Benign intracranial hypertension: Secondary | ICD-10-CM | POA: Diagnosis not present

## 2017-06-10 DIAGNOSIS — R350 Frequency of micturition: Secondary | ICD-10-CM | POA: Diagnosis not present

## 2017-06-10 DIAGNOSIS — Z79899 Other long term (current) drug therapy: Secondary | ICD-10-CM

## 2017-06-10 DIAGNOSIS — E440 Moderate protein-calorie malnutrition: Secondary | ICD-10-CM | POA: Diagnosis not present

## 2017-06-10 DIAGNOSIS — R110 Nausea: Secondary | ICD-10-CM

## 2017-06-10 DIAGNOSIS — E229 Hyperfunction of pituitary gland, unspecified: Secondary | ICD-10-CM | POA: Diagnosis not present

## 2017-06-10 DIAGNOSIS — G894 Chronic pain syndrome: Secondary | ICD-10-CM

## 2017-06-10 DIAGNOSIS — R1013 Epigastric pain: Secondary | ICD-10-CM | POA: Diagnosis not present

## 2017-06-10 DIAGNOSIS — F329 Major depressive disorder, single episode, unspecified: Secondary | ICD-10-CM

## 2017-06-10 DIAGNOSIS — M62830 Muscle spasm of back: Secondary | ICD-10-CM

## 2017-06-10 DIAGNOSIS — K912 Postsurgical malabsorption, not elsewhere classified: Secondary | ICD-10-CM | POA: Diagnosis not present

## 2017-06-10 DIAGNOSIS — T50905D Adverse effect of unspecified drugs, medicaments and biological substances, subsequent encounter: Secondary | ICD-10-CM

## 2017-06-10 DIAGNOSIS — R7989 Other specified abnormal findings of blood chemistry: Secondary | ICD-10-CM

## 2017-06-10 DIAGNOSIS — E860 Dehydration: Secondary | ICD-10-CM

## 2017-06-10 DIAGNOSIS — Z931 Gastrostomy status: Secondary | ICD-10-CM

## 2017-06-10 DIAGNOSIS — G43019 Migraine without aura, intractable, without status migrainosus: Secondary | ICD-10-CM

## 2017-06-10 DIAGNOSIS — R399 Unspecified symptoms and signs involving the genitourinary system: Secondary | ICD-10-CM

## 2017-06-10 LAB — POC MICROSCOPIC URINALYSIS (UMFC): MUCUS RE: ABSENT

## 2017-06-10 LAB — POCT URINALYSIS DIP (MANUAL ENTRY)
Bilirubin, UA: NEGATIVE
GLUCOSE UA: NEGATIVE mg/dL
Ketones, POC UA: NEGATIVE mg/dL
LEUKOCYTES UA: NEGATIVE
NITRITE UA: NEGATIVE
Protein Ur, POC: NEGATIVE mg/dL
Spec Grav, UA: <=1.005 — AB (ref 1.010–1.025)
UROBILINOGEN UA: 0.2 U/dL
pH, UA: 5.5 (ref 5.0–8.0)

## 2017-06-10 MED ORDER — CYCLOBENZAPRINE HCL 5 MG PO TABS
5.0000 mg | ORAL_TABLET | Freq: Three times a day (TID) | ORAL | 1 refills | Status: DC | PRN
Start: 2017-06-10 — End: 2018-05-25

## 2017-06-10 MED ORDER — GLUCOSE BLOOD VI STRP: 1.0000 | ORAL_STRIP | Freq: Four times a day (QID) | 5 refills | Status: AC | PRN

## 2017-06-10 NOTE — Patient Instructions (Addendum)
   IF you received an x-ray today, you will receive an invoice from [facility]. Please contact [facility] at [phone] with questions or concerns regarding your invoice.   IF you received labwork today, you will receive an invoice from [facility]. Please contact [facility] at [phone] with questions or concerns regarding your invoice.   Our billing staff will not be able to assist you with questions regarding bills from these companies.  You will be contacted with the lab results as soon as they are available. The fastest way to get your results is to activate your My Chart account. Instructions are located on the last page of this paperwork. If you have not heard from us regarding the results in 2 weeks, please contact this office.       Chronic Pain, Adult Chronic pain is a type of pain that lasts or keeps coming back (recurs) for at least six months. You may have chronic headaches, abdominal pain, or body pain. Chronic pain may be related to an illness, such as fibromyalgia or complex regional pain syndrome. Sometimes the cause of chronic pain is not known. Chronic pain can make it hard for you to do daily activities. If not treated, chronic pain can lead to other health problems, including anxiety and depression. Treatment depends on the cause and severity of your pain. You may need to work with a pain specialist to come up with a treatment plan. The plan may include medicine, counseling, and physical therapy. Many people benefit from a combination of two or more types of treatment to control their pain. Follow these instructions at home: Lifestyle  Consider keeping a pain diary to share with your health care providers.  Consider talking with a mental health care provider (psychologist) about how to cope with chronic pain.  Consider joining a chronic pain support group.  Try to control or lower your stress levels. Talk to your health care provider about  strategies to do this. General instructions   Take over-the-counter and prescription medicines only as told by your health care provider.  Follow your treatment plan as told by your health care provider. This may include: ? Gentle, regular exercise. ? Eating a healthy diet that includes foods such as vegetables, fruits, fish, and lean meats. ? Cognitive or behavioral therapy. ? Working with a physical therapist. ? Meditation or yoga. ? Acupuncture or massage therapy. ? Aroma, color, light, or sound therapy. ? Local electrical stimulation. ? Shots (injections) of numbing or pain-relieving medicines into the spine or the area of pain.  Check your pain level as told by your health care provider. Ask your health care provider if you should use a pain scale.  Learn as much as you can about how to manage your chronic pain. Ask your health care provider if an intensive pain rehabilitation program or a chronic pain specialist would be helpful.  Keep all follow-up visits as told by your health care provider. This is important. Contact a health care provider if:  Your pain gets worse.  You have new pain.  You have trouble sleeping.  You have trouble doing your normal activities.  Your pain is not controlled with treatment.  Your have side effects from pain medicine.  You feel weak. Get help right away if:  You lose feeling or have numbness in your body.  You lose control of bowel or bladder function.  Your pain suddenly gets much worse.  You develop shaking or chills.  You develop confusion.    You develop chest pain.  You have trouble breathing or shortness of breath.  You pass out.  You have thoughts about hurting yourself or others. This information is not intended to replace advice given to you by your health care provider. Make sure you discuss any questions you have with your health care provider. Document Released: 11/15/2001 Document Revised: 10/24/2015 Document  Reviewed: 08/13/2015 Elsevier Interactive Patient Education  2018 [employer]  

## 2017-06-10 NOTE — Progress Notes (Addendum)
Subjective:  By signing my name below, I, Margaret Fletcher, attest that this documentation has been prepared under the direction and in the presence of [name], MD Electronically Signed: Ladene Artist, ED Scribe 06/10/2017 at 5:52 PM.   Patient ID: Margaret Fletcher, female    DOB: 15-Feb-1971, 47 y.o.   MRN: [id number]  Chief Complaint  Patient presents with  . lab work    check prolactin  . Back Pain    lower and urinary frequency x 2 days   HPI Margaret Fletcher is a 47 y.o. female who presents to Primary Care at [facility] for f/u. 6 wks prior she noted she started lactating so a hormonal workup revealed significant elevated prolactin with other normal pituitary produced hormone levels of TSH, insulin like growth factor, ACTH and 24 hour urinary cortisol was normal. Therefore suspected the elevated prolactin was due to the metoclopramide that we started earlier this yr to treat chronic nausea and early satiety. Had her stop metoclopramide and advised rechecking in 1 month to assure levels had returned to normal. Complete metabolic panel done 3 days prior at baseline by her surgeon.  Pt states that she stopped lactating a few wks ago. She noticed "motion sickness" sensation a few days after stopping Reglan which has since resolved.  Hypoglycemia Hypoglycemic episodes with symptoms of shakiness and diaphoresis ~3 days/wk which she suspects has been occurring for a while. Occasionally experiences symptoms 1 hour after eating. States the nutritionist ruled out Dumping Syndrome since she responds to sugar such as orange juice.  Abdominal Pain Reports intermittent severe, aching abdominal painand an extremely full/stretched sensation. She applies a heating pad to her abdomen, takes 2.5 mg oxycodone, waits 4 hrs, tries 5 mg and then calls Dr. Redmond Pulling for an infusion if pain doesn't improve.  Fatigue Reports chronic fatigue x 1 yr which she states seems to be constant. Reports leg and feet cramping as  well.  HAs Pt is currently on Topamax, Amitriptyline and Relpax for chronic HAs. Denies side-effects from medications including dry mouth or feeling sluggish in the morning.  Low Back Pain Pt reports constant, non-radiating low back pain with urinary frequency x 2 days. States she suspected she had an UTI so she has been drinking a lot of water over the past 2 days. Denies dysuria, vaginal discharge.  Anxiety/Depression Pt is currently taking Klonopin at night and mid day, depending on the day. Pristiq 50 mg qd. Pt has failed Wellbutrin. She is still seeing therapist Claire Shown at [place].   Current Outpatient Medications on File Prior to Visit  Medication Sig Dispense Refill  . amitriptyline (ELAVIL) 10 MG tablet Take 2 tablets (20 mg total) by mouth at bedtime. 60 tablet 11  . amphetamine-dextroamphetamine (ADDERALL) 10 MG tablet Take 1 tablet (10 mg total) by mouth 2 (two) times daily. 60 tablet 0  . CALCIUM-VITAMIN D PO Take 1 tablet by mouth 2 (two) times daily. chewable    . clonazePAM (KLONOPIN) 0.5 MG tablet Take 1 tablet (0.5 mg total) by mouth 3 (three) times daily as needed for anxiety. 90 tablet 2  . clotrimazole (MYCELEX) 10 MG troche Take 1 tablet (10 mg total) by mouth 5 (five) times daily. Let dissolve in mouth 70 tablet 0  . Cyanocobalamin (VITAMIN B-12 SL) Place 1 drop under the tongue daily. 1 dropperful    . desvenlafaxine (PRISTIQ) 50 MG 24 hr tablet Take 1 tablet (50 mg total) by mouth daily. 30 tablet 3  .  docusate sodium (COLACE) 100 MG capsule Take 100 mg by mouth daily.    Marland Kitchen eletriptan (RELPAX) 40 MG tablet Take 1 tablet (40 mg total) by mouth every 2 (two) hours as needed for migraine or headache. Do not use >2 doses/24 hours 10 tablet 11  . lactulose (CHRONULAC) 10 GM/15ML solution Take 30 g by mouth daily as needed for moderate constipation.   0  . linaclotide (LINZESS) 145 MCG CAPS capsule Take 1 capsule (145 mcg total) by mouth daily before breakfast. As  needed for chronic constipation 30 capsule 5  . Multiple Vitamins-Minerals (BARIATRIC MULTIVITAMINS/IRON PO) Take 1 tablet by mouth 3 (three) times daily.    . Nutritional Supplements (FEEDING SUPPLEMENT, VITAL HIGH PROTEIN,) LIQD liquid Place 1,000 mLs into feeding tube daily. 12ml/hr for 8hrs a day as needed for poor oral intake; 300 ml free water q4hr (Patient taking differently: Place 1,000 mLs into feeding tube daily. 12ml/hr for 8hrs a day as needed for poor oral intake; 300 ml free water q4hr) 1000 mL 8  . ondansetron (ZOFRAN-ODT) 4 MG disintegrating tablet Take 1 tablet (4 mg total) by mouth every 6 (six) hours as needed for nausea. 30 tablet 0  . oxyCODONE (OXY IR/ROXICODONE) 5 MG immediate release tablet Take 1 tablet (5 mg total) by mouth 2 (two) times daily as needed for moderate pain. 60 tablet 0  . pantoprazole (PROTONIX) 40 MG tablet Take 40 mg by mouth daily.    . polyethylene glycol (MIRALAX / GLYCOLAX) packet Take 17 g by mouth daily as needed (for constipation.).     Marland Kitchen simethicone (MYLICON) 80 MG chewable tablet Chew 80 mg by mouth every 6 (six) hours as needed for flatulence.    . topiramate (TOPAMAX) 50 MG tablet Take 1.5 tablets (75 mg total) by mouth daily. 45 tablet 11  . XULANE 150-35 MCG/24HR transdermal patch Place 1 patch onto the skin every Sunday.   11   No current facility-administered medications on file prior to visit.    Past Medical History:  Diagnosis Date  . Alopecia   . Anxiety   . Autoimmune disease ([place])    "Alopecia"  . Bacterial vaginosis   . Cyst of spleen   . Depression   . Frequent UTI   . Hypertrophy of breast   . IIH (idiopathic intracranial hypertension) 2017   "related to headaches"  . Migraine headache    denies, ruled out - Propranolol.Using for headaches   Past Surgical History:  Procedure Laterality Date  . APPENDECTOMY    . BREAST EXCISIONAL BIOPSY Right 2017   removed at time of reduction  . BREAST REDUCTION SURGERY Bilateral  03/26/2015   Procedure: BILATERAL BREAST REDUCTION  ;  Surgeon: Youlanda Roys, MD;  Location: [facility];  Service: Plastics;  Laterality: Bilateral;  . BREATH TEK H PYLORI N/A 05/21/2014   Procedure: BREATH TEK H PYLORI;  Surgeon: Greer Pickerel, MD;  Location: [facility];  Service: General;  Laterality: N/A;  . CESAREAN SECTION    . CESAREAN SECTION    . CHOLECYSTECTOMY    . DILITATION & CURRETTAGE/HYSTROSCOPY WITH NOVASURE ABLATION N/A 07/19/2012   Procedure: HYSTEROSCOPY WITH NOVASURE ABLATION;  Surgeon: Farrel Gobble. Harrington Challenger, MD;  Location: [facility];  Service: Gynecology;  Laterality: N/A;  . ENDOMETRIAL ABLATION    . ESOPHAGOGASTRODUODENOSCOPY N/A 08/16/2015   Procedure: UPPER ESOPHAGOGASTRODUODENOSCOPY (EGD);  Surgeon: Greer Pickerel, MD;  Location: [facility];  Service: General;  Laterality: N/A;  . ESOPHAGOGASTRODUODENOSCOPY N/A 02/21/2016  Procedure: ESOPHAGOGASTRODUODENOSCOPY (EGD);  Surgeon: Greer Pickerel, MD;  Location: [facility];  Service: General;  Laterality: N/A;  . ESOPHAGOGASTRODUODENOSCOPY N/A 02/24/2017   Procedure: ESOPHAGOGASTRODUODENOSCOPY (EGD);  Surgeon: Greer Pickerel, MD;  Location: [facility];  Service: General;  Laterality: N/A;  . IR GENERIC HISTORICAL  10/04/2015   IR [place] Vivianne Master 10/04/2015 Markus Daft, MD [facility]  . IR GENERIC HISTORICAL  01/31/2016   IR [place] GASTRO/COLONIC TUBE PERCUT W/FLUORO 01/31/2016 Corrie Mckusick, DO [facility]  . IR GENERIC HISTORICAL  04/06/2016   IR [place] TUBE PERCUT W/FLUORO 04/06/2016 Arne Cleveland, MD [facility]  . IR GENERIC HISTORICAL  05/14/2016   IR GASTRIC TUBE PERC CHG W/O IMG GUIDE 05/14/2016 Sandi Mariscal, MD [facility]  . LAPAROSCOPIC GASTROSTOMY N/A 09/30/2015   Procedure: LAPAROSCOPIC GASTROSTOMY TUBE PLACEMENT;  Surgeon: Greer Pickerel, MD;  Location: [facility];  Service: General;  Laterality: N/A;  . LAPAROSCOPIC ROUX-EN-Y GASTRIC BYPASS WITH HIATAL HERNIA REPAIR N/A  07/08/2015   Procedure: LAPAROSCOPIC ROUX-EN-Y GASTRIC BYPASS WITH HIATAL HERNIA REPAIR WITH UPPER ENDOSCOPY;  Surgeon: Greer Pickerel, MD;  Location: [facility];  Service: General;  Laterality: N/A;  . LAPAROSCOPIC SMALL BOWEL RESECTION N/A 09/30/2015   Procedure: LAPAROSCOPIC REVISION OF ROUX LIMB;  Surgeon: Greer Pickerel, MD;  Location: [facility];  Service: General;  Laterality: N/A;  . LAPAROSCOPY N/A 09/30/2015   Procedure: LAPAROSCOPY DIAGNOSTIC;  Surgeon: Greer Pickerel, MD;  Location: [facility];  Service: General;  Laterality: N/A;  . REDUCTION MAMMAPLASTY Bilateral 2017  . TUBAL LIGATION    . WISDOM TOOTH EXTRACTION      Allergies  Allergen Reactions  . Sulfa Antibiotics Anaphylaxis  . Zosyn [Piperacillin Sod-Tazobactam So] Rash    Has patient had a PCN reaction causing immediate rash, facial/tongue/throat swelling, SOB or lightheadedness with hypotension: No Has patient had a PCN reaction causing severe rash involving mucus membranes or skin necrosis: No Has patient had a PCN reaction that required hospitalization: Unknown--inpatient when reaction occurred Has patient had a PCN reaction occurring within the last 10 years: Yes If all of the above answers are "NO", then may proceed with Cephalosporin use.    Family History  Problem Relation Age of Onset  . Cancer Mother        late 65'-50  . Breast cancer Mother        late 13'-50  . Stroke Father   . Diabetes Brother   . Seizures Son   . Breast cancer Maternal Aunt        late 40'-50  . Breast cancer Maternal Aunt        late 40'-50  . Breast cancer Maternal Aunt        late 40'-50  . Migraines Neg Hx    Social History   Socioeconomic History  . Marital status: Divorced    Spouse name: Not on file  . Number of children: 2  . Years of education: 41  . Highest education level: Not on file  Occupational History  . 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 Financial resource strain: Not on file  . Food insecurity:     Worry: Not on file    Inability: Not on file  . Transportation needs:    Medical: Not on file    Non-medical: Not on file  Tobacco Use  . Smoking status: Never Smoker  . Smokeless tobacco: Never Used  Substance and Sexual Activity  . Alcohol use: Yes  Comment: socially  . Drug use: No  . Sexual activity: Yes    Birth control/protection: Surgical  Lifestyle  . Physical activity:    Days per week: Not on file    Minutes per session: Not on file  . Stress: Not on file  Relationships  . Social connections:    Talks on phone: Not on file    Gets together: Not on file    Attends religious service: Not on file    Active member of club or organization: Not on file    Attends meetings of clubs or organizations: Not on file    Relationship status: Not on file  Other Topics Concern  . Not on file  Social History Narrative   Lives with partner   Caffeine use: minimal coffee   Right-handed   Depression screen [facility] 2/9 06/10/2017 04/28/2017 03/22/2017 02/12/2017 01/20/2017  Decreased Interest 0 0 0 2 0  Down, Depressed, Hopeless 0 0 0 2 0  PHQ - 2 Score 0 0 0 4 0  Altered sleeping - - - 3 -  Tired, decreased energy - - - 3 -  Change in appetite - - - 3 -  Feeling bad or failure about yourself  - - - 1 -  Trouble concentrating - - - 2 -  Moving slowly or fidgety/restless - - - 1 -  Suicidal thoughts - - - 0 -  PHQ-9 Score - - - 17 -  Difficult doing work/chores - - - Somewhat difficult -  Some recent data might be hidden     Review of Systems  Constitutional: Positive for fatigue.  Gastrointestinal: Positive for abdominal pain.  Endocrine: Negative for polydipsia.  Genitourinary: Positive for frequency. Negative for dysuria and vaginal discharge.  Musculoskeletal: Positive for back pain and myalgias.  Neurological: Positive for headaches.      Objective:   Physical Exam  Constitutional: She is oriented to person, place, and time. She appears well-developed and  well-nourished. No distress.  HENT:  Head: Normocephalic and atraumatic.  Eyes: Conjunctivae and EOM are normal.  Neck: Neck supple. No tracheal deviation present.  Cardiovascular: Normal rate.  Pulmonary/Chest: Effort normal. No respiratory distress.  Musculoskeletal: Normal range of motion.  1+ edema bilaterally.   Neurological: She is alert and oriented to person, place, and time.  Skin: Skin is warm and dry.  Psychiatric: She has a normal mood and affect. Her behavior is normal.  Nursing note and vitals reviewed.  BP 127/84 (BP Location: Right Arm)   Pulse 72   Temp 99.2 F (37.3 C) (Oral)   Resp 16   Ht 5' 6.75" (1.695 m)   Wt 197 lb 11.2 oz (89.7 kg)   SpO2 100%   BMI 31.20 kg/m     Results for orders placed or performed in visit on 06/10/17  POCT urinalysis dipstick  Result Value Ref Range   Color, UA yellow yellow   Clarity, UA clear clear   Glucose, UA negative negative mg/dL   Bilirubin, UA negative negative   Ketones, POC UA negative negative mg/dL   Spec Grav, UA <=1.005 (A) 1.010 - 1.025   Blood, UA trace-intact (A) negative   pH, UA 5.5 5.0 - 8.0   Protein Ur, POC negative negative mg/dL   Urobilinogen, UA 0.2 0.2 or 1.0 E.U./dL   Nitrite, UA Negative Negative   Leukocytes, UA Negative Negative  POCT Microscopic Urinalysis ([facility])  Result Value Ref Range   WBC,UR,HPF,POC None None WBC/hpf  RBC,UR,HPF,POC None None RBC/hpf   Bacteria None None, Too numerous to count   Mucus Absent Absent   Epithelial Cells, UR Per Microscopy None None, Too numerous to count cells/hpf   Assessment & Plan:   1. Urinary frequency - urine sample very dilute since pt has been pushing water due to Rt flank pain sxs - so will send for clx to ensure not missing UTI since sample appears to be almost entirely water.  2. Elevated prolactin level ([place]) - suspect due to reglan - h/o empty sella - recheck to  Ensure return to nml now of reglan for several wks.  3. Medication side  effect, subsequent encounter   4. UTI symptoms   5. Hypoglycemia after GI (gastrointestinal) surgery - needs test strips - freq down to 50 or lower  6. Spasm of muscle of lower back - suspect cause of Rt flank pain - try muscle relaxer qhs followed by heat and gentle stretching  7. Chronic nausea - no sig worsening now off of reglan  8. Abdominal pain, chronic, epigastric - pain continues and causing sig depression - just laying in bed suffering - I think it is reasonable to increase her pain medication as she and her provider team have tried numerous other interventions w/o success and as sxs continue on, months become years, the recurrent rentless nature of what she is starting to believe will be lifelong pain is really taking a worsening physical and mental toll on pt. Has been on oxycodone 5mg  bid to take 30 min before or after eating sinc ethat is when pain is worse but will increase from #60/mo to #120 to be filled Mon 4/8 with 1 second rx for Mon 5/6 - pt will use the additional for the times she is bed bound w/ more severe sxs. Pt told to f/u in 3 mos but I did not send rx for 3rd mo as I was wanting to get an idea of freq of use - hoping that these 2 xs will last her even longer than 3 mos.. Still waiting to get to [facility] who will hopefully have a surgery/GI/psych team to focus on pt's w/ abd/bariatric surgery complications - being referred by her surgeon Dr. Redmond Pulling but pt has not heard anything yet, has declined GI referral prior as this has been in the works.  9. Constipation, chronic - linzess w/ prn lactulose which is effective - ok to refill both x 6 mos whenever requested  10. Protein-calorie malnutrition, moderate ([place])   11. IIH (idiopathic intracranial hypertension) - sulfa allergy so cannot use diamox so was initially put on topamax 200 04/2015 - now has been weaned down to 75mg  - many of her most bothersome sxs/complaints are on side effect list but not sure there  are other options to control her IIH - encouraged pt to discuss w/ Dr. Jaynee Eagles at her visit in 2 wks.   12. Chronic fatigue - refilled adderall 10 bid #60/mo to be filled MOn 4/8, 5/6, 6/3  13. G tube feedings ([place])   14. Recurrent dehydration - needing IVF for rehydration every 2-6 wks as pt feels sxs severely worsening from surgery  15. Reactive depression - increase pristiq 50 to 100 - drug info says no proven additional benefit at doses >50 but I think we need to try before we jump ship and taper off onto another antidepressant. Started pristiq 4 mos ago and went to 50mg  dose 3 mos ago w/ no improvement in  mood sxs with med at all.  clonazepam 0.5mg  #90 on 03/15/17 - 3 mos prior - has 2 refills on it that she has not filled - mainly takes at night w/ elavil 20 to help her sleep. Still seeing psychology Sharyn Lull at [place] - pt non-commital on whether she is finding any beneficial outlet or worthwhile  Has failed mirtazapine 15, sertraline 25 (not sure she ever really took, did sufficient trial - quit 06/11/16), wellbutrin XL 300, xanax  16. Chronic pain syndrome - pt is NOT under a controlled substance agreement as was getting rxs from her surgeon at some points but as going up on quantity - rec discussing this and signing letter at next [place].  I do not see prior UDS in chart so should do at next OV.  17. High risk medications (not anticoagulants) long-term use   18.    Intractable migraine without aura and without status migrainosus -followed by Dr. Jaynee Eagles.  Migraines worsened to chronic daily occurrence ~2016. Started topamax 04/2015 which is effective for HA prevention, hasn't tried any other preventative meds per pt -HAs were very severe so I'm sure pt would be nervous to do trial off but I do wonder if this could be worsening her depression, memory issues/perceived cognitive dysfxn, fatigue, nervousness, anorexia, c/o visual disturbances prior - encouraged pt to discuss w/ Dr. Jaynee Eagles at her next OV in 2 wks -  however, I see her intial rec dose of 200mg  topamax qd (due to IIH) has been gradually weaned down to 75mg  which doesn't seem to have helped her sxs at all. Cont on elavil 20mg  qhs  Orders Placed This Encounter  Procedures  . Urine Culture  . Prolactin  . Insulin, random  . POCT urinalysis dipstick  . POCT Microscopic Urinalysis ([facility])    Meds ordered this encounter  Medications  . glucose blood (CONTOUR NEXT TEST) test strip    Sig: 1 each by Other route 4 (four) times daily as needed (hypoglycemia). K91.2    Dispense:  270 each    Refill:  5  . cyclobenzaprine (FLEXERIL) 5 MG tablet    Sig: Take 1 tablet (5 mg total) by mouth 3 (three) times daily as needed for muscle spasms. May take 2 tabs po qhs    Dispense:  40 tablet    Refill:  1  . oxyCODONE (OXY IR/ROXICODONE) 5 MG immediate release tablet    Sig: Take 1-2 tablets (5-10 mg total) by mouth every 6 (six) hours as needed for severe pain.    Dispense:  120 tablet    Refill:  0  . BAYER MICROLET LANCETS lancets    Sig: Use as instructed    Dispense:  100 each    Refill:  12    Need lancets to fit pt's Bayer MicroLet 2 Lancing Device - lancets she was last dispensed w/ it do not fit - please give pt tutorial on how to fit into device upon dispensing. Thanks.  Marland Kitchen amphetamine-dextroamphetamine (ADDERALL) 10 MG tablet    Sig: Take 1 tablet (10 mg total) by mouth 2 (two) times daily.    Dispense:  60 tablet    Refill:  0  . desvenlafaxine (PRISTIQ) 100 MG 24 hr tablet    Sig: Take 1 tablet (100 mg total) by mouth daily.    Dispense:  90 tablet    Refill:  1    D/c pristiq 25 mg dose  . amphetamine-dextroamphetamine (ADDERALL) 10 MG tablet    Sig:  Take 1 tablet (10 mg total) by mouth 2 (two) times daily with a meal.    Dispense:  60 tablet    Refill:  0  . amphetamine-dextroamphetamine (ADDERALL) 10 MG tablet    Sig: Take 1 tablet (10 mg total) by mouth 2 (two) times daily with a meal.    Dispense:  60 tablet    Refill:   0  . oxyCODONE (OXY IR/ROXICODONE) 5 MG immediate release tablet    Sig: Take 1-2 tablets (5-10 mg total) by mouth every 6 (six) hours as needed for severe pain.    Dispense:  120 tablet    Refill:  0   Today I have utilized the [place] Controlled Substance Registry's online query to confirm compliance regarding the patient's controlled medications. My review reveals appropriate prescription fills. Rechecks will occur regularly and the patient is aware of our use of the system.  Over 40 min spent in face-to-face evaluation of and consultation with patient and coordination of care.  Over 50% of this time was spent counseling this patient regarding worsening depression in setting of chronic pain, options for medication changes.  I personally performed the services described in this documentation, which was scribed in my presence. The recorded information has been reviewed and considered, and addended by me as needed.   [name], M.D.  Primary Care at [facility] [address] [place], [place] [address] [phone] phone [phone] fax  06/13/17 3:22 AM

## 2017-06-11 LAB — URINE CULTURE

## 2017-06-11 LAB — PROLACTIN: Prolactin: 28.1 ng/mL — ABNORMAL HIGH (ref 4.8–23.3)

## 2017-06-11 LAB — INSULIN, RANDOM: INSULIN: 4.6 u[IU]/mL (ref 2.6–24.9)

## 2017-06-11 MED FILL — CONTOUR NEXT STRIPS: 62 days supply | Qty: 250 | Fill #0

## 2017-06-11 MED FILL — CYCLOBENZAPRINE 5 MG TABLET: 5 | 8 days supply | Qty: 40 | Fill #0

## 2017-06-13 DIAGNOSIS — Z79899 Other long term (current) drug therapy: Secondary | ICD-10-CM | POA: Insufficient documentation

## 2017-06-13 DIAGNOSIS — G894 Chronic pain syndrome: Secondary | ICD-10-CM | POA: Insufficient documentation

## 2017-06-13 DIAGNOSIS — F329 Major depressive disorder, single episode, unspecified: Secondary | ICD-10-CM | POA: Insufficient documentation

## 2017-06-13 MED ORDER — AMPHETAMINE-DEXTROAMPHETAMINE 10 MG PO TABS: 10.0000 mg | ORAL_TABLET | Freq: Two times a day (BID) | ORAL | 0 refills | Status: DC

## 2017-06-13 MED ORDER — OXYCODONE HCL 5 MG PO TABS: 5.0000 mg | ORAL_TABLET | Freq: Four times a day (QID) | ORAL | 0 refills | Status: DC | PRN

## 2017-06-13 MED ORDER — DESVENLAFAXINE SUCCINATE ER 100 MG PO TB24: 100.0000 mg | ORAL_TABLET | Freq: Every day | ORAL | 1 refills | Status: DC

## 2017-06-13 MED ORDER — BAYER MICROLET LANCETS MISC: 12 refills | Status: AC

## 2017-06-13 NOTE — Telephone Encounter (Signed)
On the 3rd or 4th time, I think I finally got them completed correctly. Placed in 102 front office FMLA box.    In folder is also a copy of Dr. Dois Davenport same FMLA forms that he completed - I reviewed these to make sure I was abiding by her surgeon's recommendation since he has better insight into her physical abilities than I. Please make sure that surgeons disability forms get scanned into her chart to for my future reference as I am sure we will have to complete these forms for her many times again periodically

## 2017-06-14 MED FILL — XULANE PATCH: 150-35 | 21 days supply | Qty: 3 | Fill #1

## 2017-06-14 MED FILL — DESVENLAFAXINE SUC ER 100 M: 100 | 90 days supply | Qty: 90 | Fill #0

## 2017-06-14 MED FILL — oxyCODONE HCL 5 MG TABS: 5 | 15 days supply | Qty: 120 | Fill #0

## 2017-06-14 MED FILL — MICROLET LANCETS: 25 days supply | Qty: 100 | Fill #0

## 2017-06-14 MED FILL — DEXTROAMP-AMP 10 MG TAB: 10 | 30 days supply | Qty: 60 | Fill #0

## 2017-06-14 NOTE — Telephone Encounter (Signed)
Paperwork scanned and faxed on 06/14/17

## 2017-06-15 MED FILL — ELETRIPTAN HBR 40 MG TABLET: 40 | 30 days supply | Qty: 10 | Fill #3

## 2017-06-15 MED FILL — ONDANSETRON ODT 4 MG TABLET: 4 | 5 days supply | Qty: 20 | Fill #1

## 2017-06-15 MED FILL — LINZESS 145 MCG CAPSULE: 145 | 30 days supply | Qty: 30 | Fill #1

## 2017-06-15 MED FILL — TOPIRAMATE 50 MG TABLET: 50 | 30 days supply | Qty: 45 | Fill #2

## 2017-06-23 ENCOUNTER — Ambulatory Visit: Payer: BLUE CROSS/BLUE SHIELD | Admitting: Neurology

## 2017-06-23 ENCOUNTER — Encounter: Payer: Self-pay | Admitting: Neurology

## 2017-06-23 ENCOUNTER — Ambulatory Visit: Payer: Self-pay | Admitting: Neurology

## 2017-06-23 ENCOUNTER — Telehealth: Payer: Self-pay | Admitting: *Deleted

## 2017-06-23 NOTE — Telephone Encounter (Signed)
Pt no showed f/u appt on 06/23/2017 @ 08:30.

## 2017-06-30 MED FILL — XULANE PATCH: 150-35 | 21 days supply | Qty: 3 | Fill #2

## 2017-07-12 ENCOUNTER — Encounter: Payer: Self-pay | Admitting: Family Medicine

## 2017-07-26 MED FILL — clonazePAM 0.5 MG TABS: 0.5 | 30 days supply | Qty: 90 | Fill #1

## 2017-07-26 MED FILL — XULANE PATCH: 150-35 | 21 days supply | Qty: 3 | Fill #3

## 2017-07-26 MED FILL — TOPIRAMATE 50 MG TABLET: 50 | 30 days supply | Qty: 45 | Fill #3

## 2017-07-26 MED FILL — oxyCODONE HCL 5 MG TABS: 5 | 15 days supply | Qty: 120 | Fill #0

## 2017-07-26 MED FILL — DEXTROAMP-AMP 10 MG TAB: 10 | 30 days supply | Qty: 60 | Fill #0

## 2017-07-31 ENCOUNTER — Encounter: Payer: Self-pay | Admitting: Family Medicine

## 2017-08-04 IMAGING — CT CT ABD-PELV W/ CM
2 of 5 series · 15 of 46 positions shown, 17 images · IV contrast (APPLIED)
Comparison: CT Abdomen and Pelvis 01/30/2016 and earlier.

CLINICAL DATA: 45-year-old female with epigastric pain. Status post
Roux-en-Y gastric bypass [REDACTED], status post revision of Roux limb
in [REDACTED]. Initial encounter.

EXAM:
CT ABDOMEN AND PELVIS WITH CONTRAST
TECHNIQUE: Multidetector CT imaging of the abdomen and pelvis was performed
using the standard protocol following bolus administration of
intravenous contrast.
CONTRAST:  100mL SKMBKL-NNN IOPAMIDOL (SKMBKL-NNN) INJECTION 61%

[Series 2: axial st · axial · 0.78mm/px · z∈[-443,-43]mm · 12 of 92 slices shown, 14 images]
[im 6/92  soft-tissue]
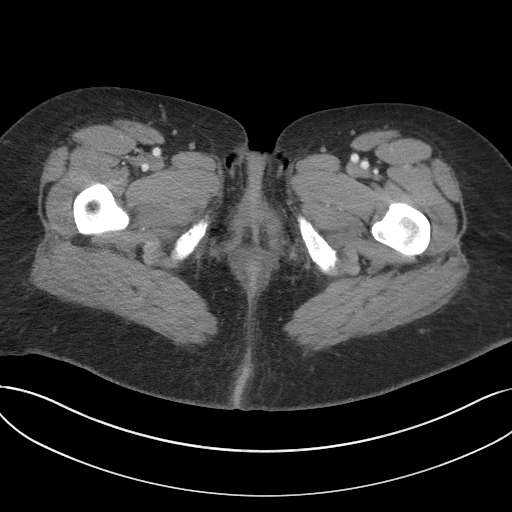
[im 6/92  bone]
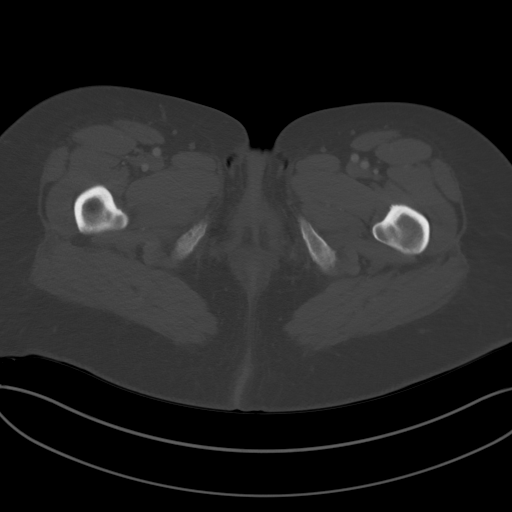
[im 16/92  soft-tissue]
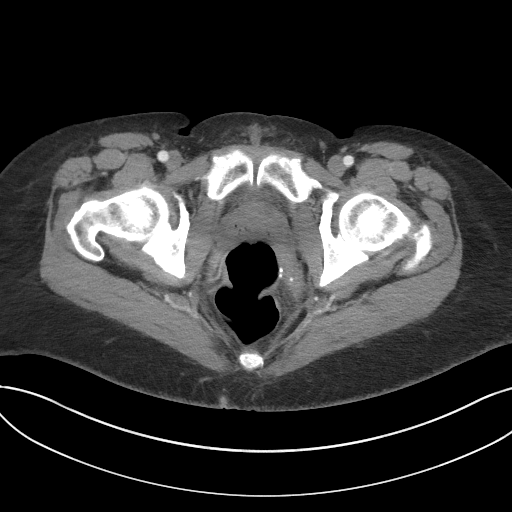
[im 21/92  soft-tissue]
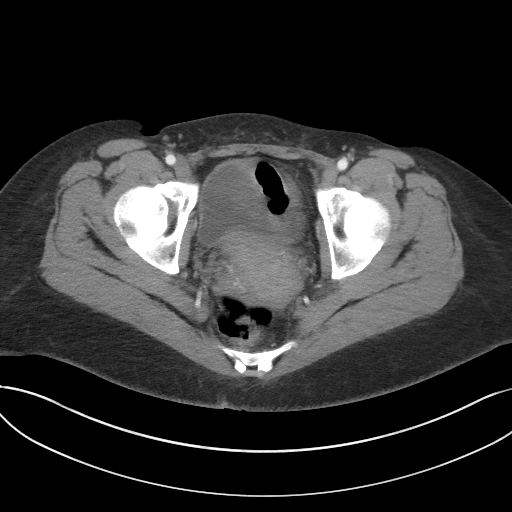
[im 26/92  soft-tissue]
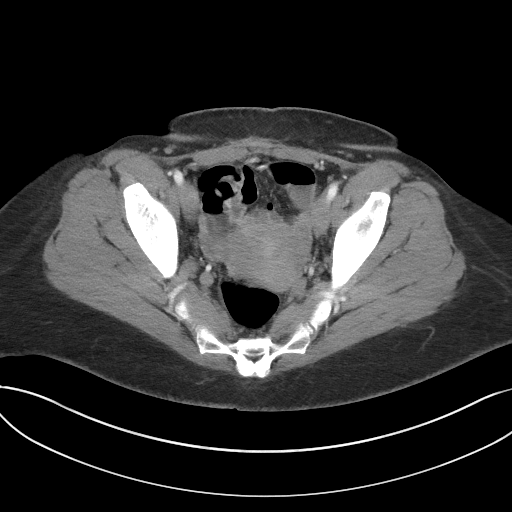
[im 36/92  soft-tissue]
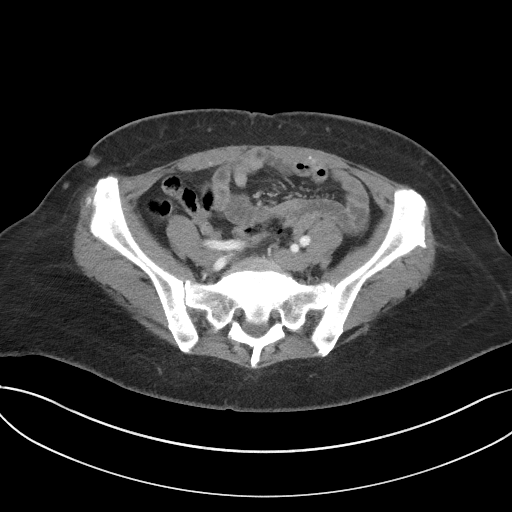
[im 41/92  soft-tissue]
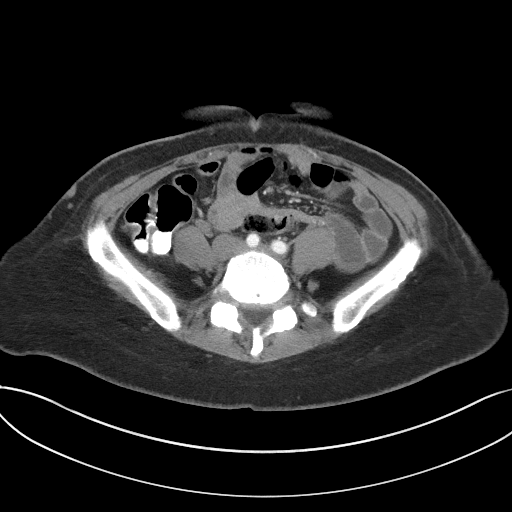
[im 51/92  soft-tissue]
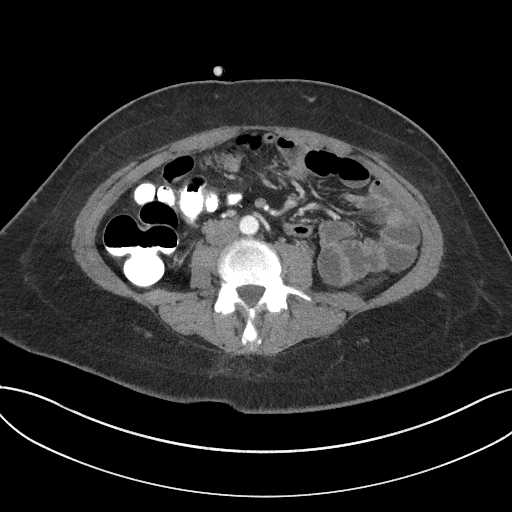
[im 56/92  soft-tissue]
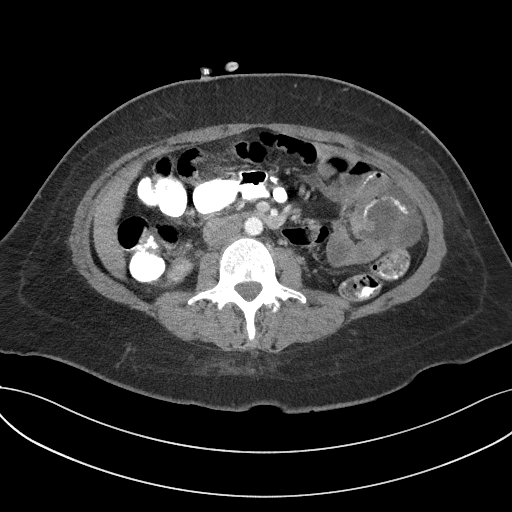
[im 66/92  soft-tissue]
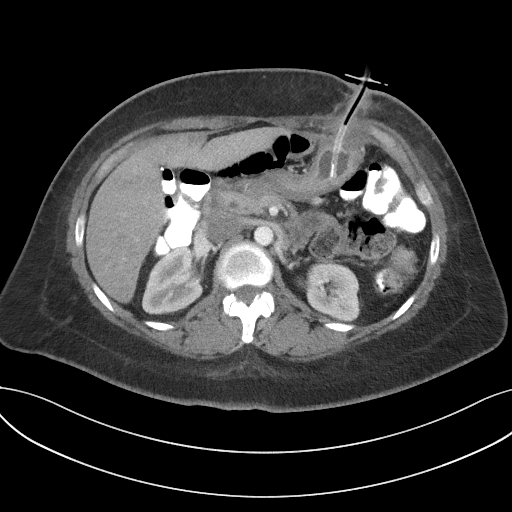
[im 66/92  bone]
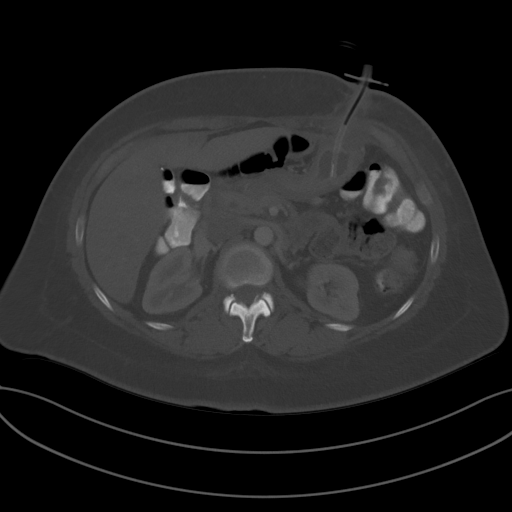
[im 71/92  soft-tissue]
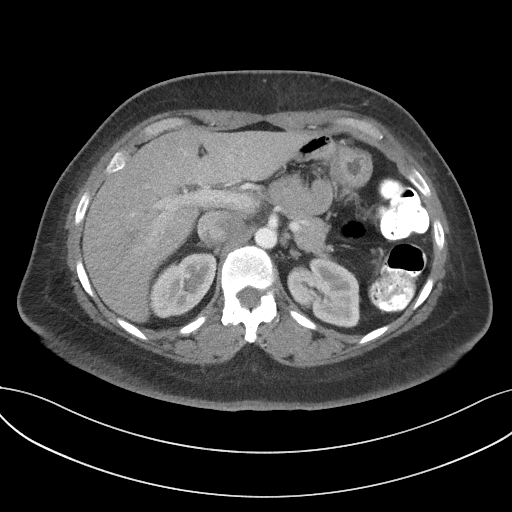
[im 76/92  soft-tissue]
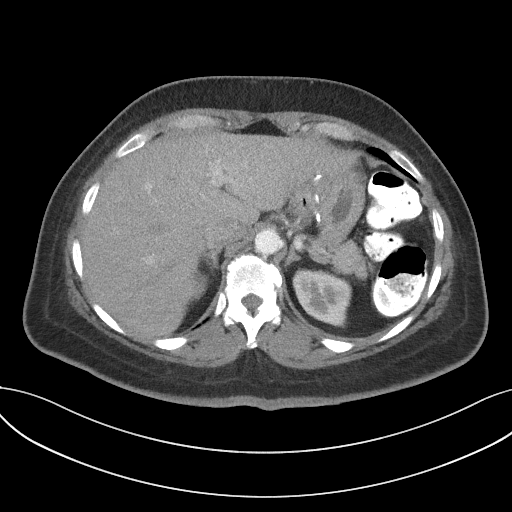
[im 86/92  soft-tissue]
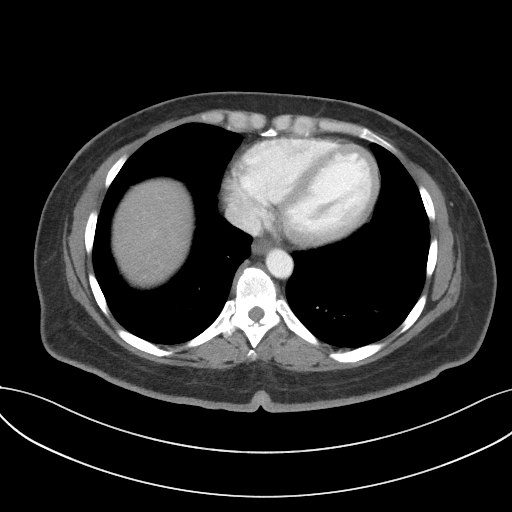

[Series 5: coronal st · coronal · 0.71mm/px · 3 of 101 slices shown]
[im 34/101  soft-tissue]
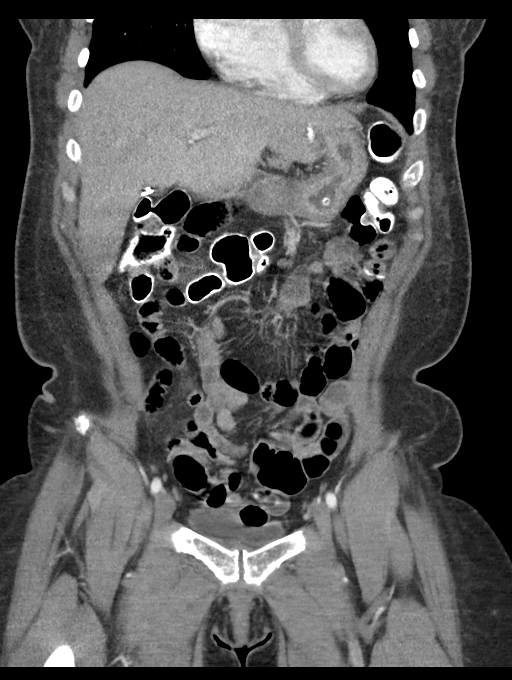
[im 45/101  soft-tissue]
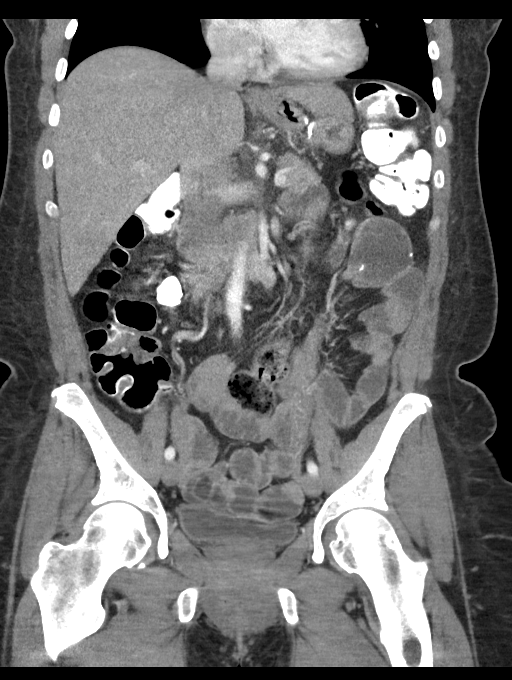
[im 56/101  soft-tissue]
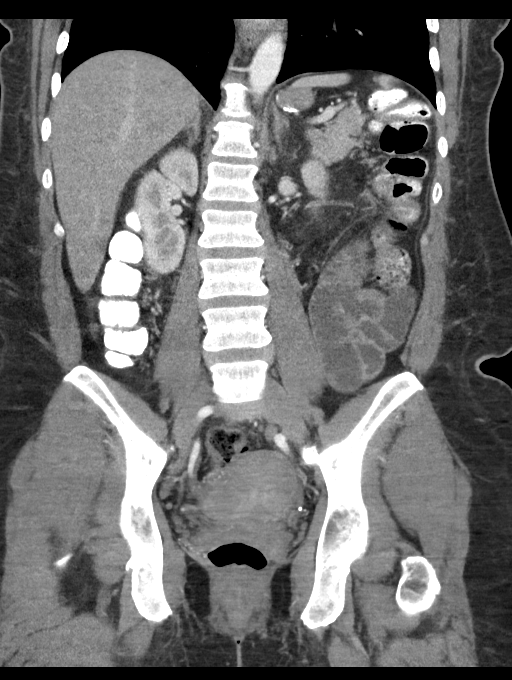

[15 of 46 positions shown; findings below may reference images not displayed]

FINDINGS: Lower chest: Negative.  No pericardial or pleural effusion.

No upper abdominal free air.

Hepatobiliary: Surgically absent gallbladder. Negative liver. No
biliary ductal enlargement.

Pancreas: Negative.

Spleen: Negative.

Adrenals/Urinary Tract: Negative adrenal glands. Kidneys appears
stable. Bilateral renal enhancement and contrast excretion is
normal. Unremarkable urinary bladder.

Stomach/Bowel: Gas distended rectum. Mild retained stool in the
sigmoid colon. Oral contrast has reached the descending colon which
is redundant. Negative transverse colon. Negative right colon.
Diminutive or absent appendix.

Postoperative changes to the stomach and small bowel with
percutaneous gastrostomy tube in place today. This tube is in the
distal/bypassed portion of the stomach. Gastrojejunostomy changes
and left abdominal small bowel anastomosis again demonstrated.
Fluid-filled but nondilated small bowel in the left abdomen. The
duodenum and ligament of Treitz are decompressed. No abdominal free
air or free fluid.

Vascular/Lymphatic: Major arterial structures are patent. Grossly
patent portal venous system.

No lymphadenopathy.

Reproductive: Fibroid uterus appears stable.

Other: No pelvic free fluid.

Musculoskeletal: Stable visualized osseous structures. Lumbosacral
junction disc and endplate degeneration.
IMPRESSION: 1. No bowel obstruction, oral contrast has reached the descending
colon. Gastrostomy tube again in place in the distal/bypassed
portion of the stomach, which is decompressed along with the
duodenum. Sequelae of Roux-en-Y with fluid-filled but nondilated
left abdominal small bowel loops.
2. No acute or inflammatory process identified.

## 2017-08-25 ENCOUNTER — Ambulatory Visit (INDEPENDENT_AMBULATORY_CARE_PROVIDER_SITE_OTHER): Payer: Self-pay | Admitting: Family Medicine

## 2017-08-25 ENCOUNTER — Encounter: Payer: Self-pay | Admitting: Family Medicine

## 2017-08-25 VITALS — BP 132/84 | HR 97 | Temp 99.3°F | Resp 17 | Ht 67.0 in | Wt 188.0 lb

## 2017-08-25 DIAGNOSIS — R110 Nausea: Secondary | ICD-10-CM

## 2017-08-25 DIAGNOSIS — R1013 Epigastric pain: Secondary | ICD-10-CM

## 2017-08-25 DIAGNOSIS — E440 Moderate protein-calorie malnutrition: Secondary | ICD-10-CM

## 2017-08-25 DIAGNOSIS — K5909 Other constipation: Secondary | ICD-10-CM

## 2017-08-25 DIAGNOSIS — G8929 Other chronic pain: Secondary | ICD-10-CM

## 2017-08-25 DIAGNOSIS — R519 Headache, unspecified: Secondary | ICD-10-CM

## 2017-08-25 DIAGNOSIS — R5382 Chronic fatigue, unspecified: Secondary | ICD-10-CM

## 2017-08-25 DIAGNOSIS — G894 Chronic pain syndrome: Secondary | ICD-10-CM

## 2017-08-25 DIAGNOSIS — N3000 Acute cystitis without hematuria: Secondary | ICD-10-CM

## 2017-08-25 DIAGNOSIS — R300 Dysuria: Secondary | ICD-10-CM

## 2017-08-25 DIAGNOSIS — Z79899 Other long term (current) drug therapy: Secondary | ICD-10-CM

## 2017-08-25 DIAGNOSIS — F329 Major depressive disorder, single episode, unspecified: Secondary | ICD-10-CM

## 2017-08-25 DIAGNOSIS — R51 Headache: Secondary | ICD-10-CM

## 2017-08-25 LAB — POC MICROSCOPIC URINALYSIS (UMFC): MUCUS RE: ABSENT

## 2017-08-25 LAB — POCT URINALYSIS DIP (MANUAL ENTRY)
Bilirubin, UA: NEGATIVE
Glucose, UA: NEGATIVE mg/dL
Ketones, POC UA: NEGATIVE mg/dL
Leukocytes, UA: NEGATIVE
Nitrite, UA: POSITIVE — AB
PROTEIN UA: NEGATIVE mg/dL
Spec Grav, UA: 1.025 (ref 1.010–1.025)
UROBILINOGEN UA: 1 U/dL
pH, UA: 5.5 (ref 5.0–8.0)

## 2017-08-25 MED ORDER — CEPHALEXIN 500 MG PO CAPS: 500.0000 mg | ORAL_CAPSULE | Freq: Two times a day (BID) | ORAL | 0 refills | Status: DC

## 2017-08-25 MED ORDER — FLUCONAZOLE 150 MG PO TABS: 150.0000 mg | ORAL_TABLET | Freq: Once | ORAL | 0 refills | Status: AC

## 2017-08-25 MED FILL — FLUCONAZOLE 150 MG TABS: 150 | 2 days supply | Qty: 2 | Fill #0

## 2017-08-25 MED FILL — XULANE PATCH: 150-35 | 21 days supply | Qty: 3 | Fill #4

## 2017-08-25 MED FILL — CEPHALEXIN 500 MG CAPSULE: 500 | 10 days supply | Qty: 20 | Fill #0

## 2017-08-25 NOTE — Patient Instructions (Addendum)
     IF you received an x-ray today, you will receive an invoice from [facility]. Please contact  Radiology at [phone] with questions or concerns regarding your invoice.   IF you received labwork today, you will receive an invoice from [facility]. Please contact [facility] at [phone] with questions or concerns regarding your invoice.   Our billing staff will not be able to assist you with questions regarding bills from these companies.  You will be contacted with the lab results as soon as they are available. The fastest way to get your results is to activate your My Chart account. Instructions are located on the last page of this paperwork. If you have not heard from us regarding the results in 2 weeks, please contact this office.     Urinary Tract Infection, Adult A urinary tract infection (UTI) is an infection of any part of the urinary tract. The urinary tract includes the:  Kidneys.  Ureters.  Bladder.  Urethra.  These organs make, store, and get rid of pee (urine) in the body. Follow these instructions at home:  Take over-the-counter and prescription medicines only as told by your doctor.  If you were prescribed an antibiotic medicine, take it as told by your doctor. Do not stop taking the antibiotic even if you start to feel better.  Avoid the following drinks: ? Alcohol. ? Caffeine. ? Tea. ? Carbonated drinks.  Drink enough fluid to keep your pee clear or pale yellow.  Keep all follow-up visits as told by your doctor. This is important.  Make sure to: ? Empty your bladder often and completely. Do not to hold pee for long periods of time. ? Empty your bladder before and after sex. ? Wipe from front to back after a bowel movement if you are female. Use each tissue one time when you wipe. Contact a doctor if:  You have back pain.  You have a fever.  You feel sick to your stomach (nauseous).  You throw up (vomit).  Your symptoms do  not get better after 3 days.  Your symptoms go away and then come back. Get help right away if:  You have very bad back pain.  You have very bad lower belly (abdominal) pain.  You are throwing up and cannot keep down any medicines or water. This information is not intended to replace advice given to you by your health care provider. Make sure you discuss any questions you have with your health care provider. Document Released: 08/12/2007 Document Revised: 08/01/2015 Document Reviewed: 01/14/2015 Elsevier Interactive Patient Education  2018 Elsevier Inc.  

## 2017-08-25 NOTE — Progress Notes (Addendum)
Subjective:  By signing my name below, I, Margaret Fletcher, attest that this documentation has been prepared under the direction and in the presence of [name], MD Electronically Signed: Ladene Artist, ED Scribe 08/25/2017 at 4:38 PM.   Patient ID: Margaret Fletcher, female    DOB: [birth year], 47 y.o.   MRN: [id number]  Chief Complaint  Patient presents with  . Dysuria   HPI Margaret Fletcher is a 47 y.o. female who presents to [facility] Care at [facility] complaining of dysuria x 1 wk. Pt reports fever, chills, flank pain, urinary retention, increased nausea (chronic but reports it has worsened over the past wk). She has tried Azo with minimal relief. Denies vaginal discharge, vaginal itching, malodorous urine. No recent antibiotic use.  GI Dr. Redmond Pulling started pt on creon, which she is supposed to take with meals/snacks, to break down foods. She states it helps some. She is still doing Ensure drinks. Consistently takes Linzess and Miralax for constipation.  Mood Pt takes 1 Klonopin during the day, 1 at night but states sometimes she skips the dose during the day. She has been seeing therapist Dr. Wynetta Emery at [facility] which she states helps some.  Past Medical History:  Diagnosis Date  . Alopecia   . Anxiety   . Autoimmune disease ([place])    "Alopecia"  . Bacterial vaginosis   . Cyst of spleen   . Depression   . Frequent UTI   . Hypertrophy of breast   . IIH (idiopathic intracranial hypertension) 2017   "related to headaches"  . Migraine headache    denies, ruled out - Propranolol.Using for headaches   Current Outpatient Medications on File Prior to Visit  Medication Sig Dispense Refill  . amitriptyline (ELAVIL) 10 MG tablet Take 2 tablets (20 mg total) by mouth at bedtime. 60 tablet 11  . amphetamine-dextroamphetamine (ADDERALL) 10 MG tablet Take 1 tablet (10 mg total) by mouth 2 (two) times daily. 60 tablet 0  . amphetamine-dextroamphetamine (ADDERALL) 10 MG tablet Take 1 tablet (10 mg  total) by mouth 2 (two) times daily with a meal. 60 tablet 0  . amphetamine-dextroamphetamine (ADDERALL) 10 MG tablet Take 1 tablet (10 mg total) by mouth 2 (two) times daily with a meal. 60 tablet 0  . BAYER MICROLET LANCETS lancets Use as instructed 100 each 12  . CALCIUM-VITAMIN D PO Take 1 tablet by mouth 2 (two) times daily. chewable    . clonazePAM (KLONOPIN) 0.5 MG tablet Take 1 tablet (0.5 mg total) by mouth 3 (three) times daily as needed for anxiety. 90 tablet 2  . clotrimazole (MYCELEX) 10 MG troche Take 1 tablet (10 mg total) by mouth 5 (five) times daily. Let dissolve in mouth 70 tablet 0  . Cyanocobalamin (VITAMIN B-12 SL) Place 1 drop under the tongue daily. 1 dropperful    . cyclobenzaprine (FLEXERIL) 5 MG tablet Take 1 tablet (5 mg total) by mouth 3 (three) times daily as needed for muscle spasms. May take 2 tabs po qhs 40 tablet 1  . desvenlafaxine (PRISTIQ) 100 MG 24 hr tablet Take 1 tablet (100 mg total) by mouth daily. 90 tablet 1  . docusate sodium (COLACE) 100 MG capsule Take 100 mg by mouth daily.    Marland Kitchen eletriptan (RELPAX) 40 MG tablet Take 1 tablet (40 mg total) by mouth every 2 (two) hours as needed for migraine or headache. Do not use >2 doses/24 hours 10 tablet 11  . glucose blood (CONTOUR NEXT TEST) test strip 1  each by Other route 4 (four) times daily as needed (hypoglycemia). K91.2 270 each 5  . lactulose (CHRONULAC) 10 GM/15ML solution Take 30 g by mouth daily as needed for moderate constipation.   0  . linaclotide (LINZESS) 145 MCG CAPS capsule Take 1 capsule (145 mcg total) by mouth daily before breakfast. As needed for chronic constipation 30 capsule 5  . Multiple Vitamins-Minerals (BARIATRIC MULTIVITAMINS/IRON PO) Take 1 tablet by mouth 3 (three) times daily.    . Nutritional Supplements (FEEDING SUPPLEMENT, VITAL HIGH PROTEIN,) LIQD liquid Place 1,000 mLs into feeding tube daily. 10ml/hr for 8hrs a day as needed for poor oral intake; 300 ml free water q4hr (Patient  taking differently: Place 1,000 mLs into feeding tube daily. 36ml/hr for 8hrs a day as needed for poor oral intake; 300 ml free water q4hr) 1000 mL 8  . ondansetron (ZOFRAN-ODT) 4 MG disintegrating tablet Take 1 tablet (4 mg total) by mouth every 6 (six) hours as needed for nausea. 30 tablet 0  . oxyCODONE (OXY IR/ROXICODONE) 5 MG immediate release tablet Take 1-2 tablets (5-10 mg total) by mouth every 6 (six) hours as needed for severe pain. 120 tablet 0  . oxyCODONE (OXY IR/ROXICODONE) 5 MG immediate release tablet Take 1-2 tablets (5-10 mg total) by mouth every 6 (six) hours as needed for severe pain. 120 tablet 0  . pantoprazole (PROTONIX) 40 MG tablet Take 40 mg by mouth daily.    . polyethylene glycol (MIRALAX / GLYCOLAX) packet Take 17 g by mouth daily as needed (for constipation.).     Marland Kitchen simethicone (MYLICON) 80 MG chewable tablet Chew 80 mg by mouth every 6 (six) hours as needed for flatulence.    . topiramate (TOPAMAX) 50 MG tablet Take 1.5 tablets (75 mg total) by mouth daily. 45 tablet 11  . XULANE 150-35 MCG/24HR transdermal patch Place 1 patch onto the skin every Sunday.   11   No current facility-administered medications on file prior to visit.    Allergies  Allergen Reactions  . Sulfa Antibiotics Anaphylaxis  . Metoclopramide Other (See Comments)    Elevated prolactin >5x ULN induction lactation when taking scheduled for several months  . Zosyn [Piperacillin Sod-Tazobactam So] Rash    Has patient had a PCN reaction causing immediate rash, facial/tongue/throat swelling, SOB or lightheadedness with hypotension: No Has patient had a PCN reaction causing severe rash involving mucus membranes or skin necrosis: No Has patient had a PCN reaction that required hospitalization: Unknown--inpatient when reaction occurred Has patient had a PCN reaction occurring within the last 10 years: Yes If all of the above answers are "NO", then may proceed with Cephalosporin use.    Review of  Systems  Constitutional: Positive for chills and fever.  Gastrointestinal: Positive for nausea (increased).  Genitourinary: Positive for dysuria and flank pain. Negative for vaginal discharge.      Objective:   Physical Exam  Constitutional: She is oriented to person, place, and time. She appears well-developed and well-nourished. No distress.  HENT:  Head: Normocephalic and atraumatic.  Eyes: Conjunctivae and EOM are normal.  Neck: Neck supple. No tracheal deviation present.  Cardiovascular: Normal rate.  Pulmonary/Chest: Effort normal. No respiratory distress.  Abdominal: There is CVA tenderness (R greater than L).  Musculoskeletal: Normal range of motion.  Neurological: She is alert and oriented to person, place, and time.  Skin: Skin is warm and dry.  Psychiatric: She has a normal mood and affect. Her behavior is normal.  Nursing note and vitals  reviewed.  BP 132/84   Pulse 97   Temp 99.3 F (37.4 C) (Oral)   Resp 17   Ht 5\' 7"  (1.702 m)   Wt 188 lb (85.3 kg)   SpO2 98%   BMI 29.44 kg/m     Results for orders placed or performed in visit on 08/25/17  POCT urinalysis dipstick  Result Value Ref Range   Color, UA yellow yellow   Clarity, UA clear clear   Glucose, UA negative negative mg/dL   Bilirubin, UA negative negative   Ketones, POC UA negative negative mg/dL   Spec Grav, UA 1.025 1.010 - 1.025   Blood, UA small (A) negative   pH, UA 5.5 5.0 - 8.0   Protein Ur, POC negative negative mg/dL   Urobilinogen, UA 1.0 0.2 or 1.0 E.U./dL   Nitrite, UA Positive (A) Negative   Leukocytes, UA Negative Negative  POCT Microscopic Urinalysis ([facility])  Result Value Ref Range   WBC,UR,HPF,POC Moderate (A) None WBC/hpf   RBC,UR,HPF,POC None None RBC/hpf   Bacteria Too numerous to count  None, Too numerous to count   Mucus Absent Absent   Epithelial Cells, UR Per Microscopy None None, Too numerous to count cells/hpf   Assessment & Plan:    1. Dysuria   2. Acute cystitis  without hematuria - keflex  3. Chronic nausea   4. Abdominal pain, chronic, epigastric   5. Chronic pain syndrome - Had oxycodone 5mg  #120 written 2/20 filled 3/1, written 4/7 filled 4/8, and second written 4/7 filled 5/20. Sent in refill for 7/3 and on or after 8/2.  6. High risk medications (not anticoagulants) long-term use   7. Reactive depression - cont working w/ psychologist at [employer].  Filled clonazepam 0.5 #90 written 03/15/17 on 1/7 and again 5/20 but sill has one additional refill but will expire as of 7/7 so ok to call in #90 when requested mid August -pt using <1x/d.  8. Protein-calorie malnutrition, moderate ([place])   9. Chronic daily headache   10. Chronic fatigue - adderall 10 bid #60 written 2/20 filled 3/1, written 4/7 filled 4/8, written 4/7 filled 5/20, 3rd written 4/7 filled 6/26 Sent in refills to be filled on or after 7/26, 8/23, 9/23.  11. Constipation, chronic - linzess    Orders Placed This Encounter  Procedures  . Urine Culture  . POCT urinalysis dipstick  . POCT Microscopic Urinalysis ([facility])    Meds ordered this encounter  Medications  . cephALEXin (KEFLEX) 500 MG capsule    Sig: Take 1 capsule (500 mg total) by mouth 2 (two) times daily.    Dispense:  20 capsule    Refill:  0  . fluconazole (DIFLUCAN) 150 MG tablet    Sig: Take 1 tablet (150 mg total) by mouth once for 1 dose. Repeat if needed    Dispense:  2 tablet    Refill:  0   Today I have utilized the [place] Controlled Substance Registry's online query to confirm compliance regarding the patient's controlled medications. My review reveals appropriate prescription fills and that I am the sole provider of these medications. Rechecks will occur regularly and the patient is aware of our use of the system.  I personally performed the services described in this documentation, which was scribed in my presence. The recorded information has been reviewed and considered, and addended by me as needed.   [name],  M.D.  Primary Care at [facility] [address] [place], [place] [address] [phone]  [phone] phone [phone] fax  09/08/17 9:46 AM

## 2017-08-27 LAB — URINE CULTURE

## 2017-09-01 ENCOUNTER — Other Ambulatory Visit: Payer: Self-pay | Admitting: Family Medicine

## 2017-09-01 MED FILL — DESVENLAFAXINE SUC ER 100 M: 100 | 30 days supply | Qty: 30 | Fill #1

## 2017-09-01 MED FILL — DEXTROAMP-AMP 10 MG TAB: 10 | 30 days supply | Qty: 60 | Fill #0

## 2017-09-07 ENCOUNTER — Other Ambulatory Visit: Payer: Self-pay | Admitting: Family Medicine

## 2017-09-07 NOTE — Telephone Encounter (Signed)
LOV  08/25/17 Dr. Brigitte Pulse Last refill 07/12/17  # 120

## 2017-09-07 NOTE — Telephone Encounter (Signed)
Copied from [place] [phone]. Topic: Quick Communication - Rx Refill/Question >> Sep 07, 2017 10:15 AM Gardiner Ramus wrote: Medication:oxyCODONE (OXY IR/ROXICODONE) 5 MG immediate release tablet   Has the patient contacted their pharmacy? Yes   Preferred Pharmacy (with phone number or street name): [place], [place] - [place] [phone] (Phone) [phone] (Fax)   Agent: Please be advised that RX refills may take up to 3 business days. We ask that you follow-up with your pharmacy.

## 2017-09-08 MED ORDER — OXYCODONE HCL 5 MG PO TABS: 5.0000 mg | ORAL_TABLET | Freq: Four times a day (QID) | ORAL | 0 refills | Status: DC | PRN

## 2017-09-08 MED ORDER — AMPHETAMINE-DEXTROAMPHETAMINE 10 MG PO TABS: 10.0000 mg | ORAL_TABLET | Freq: Two times a day (BID) | ORAL | 0 refills | Status: DC

## 2017-09-08 MED FILL — oxyCODONE HCL 5 MG TABS: 5 | 15 days supply | Qty: 120 | Fill #0

## 2017-10-05 MED FILL — AMPHETAMINE-DEXTROAMPHETAMI: 10 | 30 days supply | Qty: 60 | Fill #0

## 2017-10-11 ENCOUNTER — Encounter: Payer: Self-pay | Admitting: Family Medicine

## 2017-10-25 MED FILL — DESVENLAFAXINE SUC ER 100 M: 100 | 30 days supply | Qty: 30 | Fill #2

## 2017-10-25 MED FILL — oxyCODONE HCL 5 MG TABS: 5 | 15 days supply | Qty: 120 | Fill #0

## 2017-10-26 ENCOUNTER — Other Ambulatory Visit: Payer: Self-pay | Admitting: Family Medicine

## 2017-10-26 NOTE — Telephone Encounter (Signed)
Refill of klonopin  LRF 03/15/17  #90  2 refills  LOV 08/25/17  Dr. Candie Mile [facility] - [place], [place]

## 2017-10-29 NOTE — Telephone Encounter (Signed)
Patient is requesting a refill of the following medications: Requested Prescriptions   Pending Prescriptions Disp Refills  . clonazePAM (KLONOPIN) 0.5 MG tablet [Pharmacy Med Name: ClonazePAM 0.5 MG TABS 0.5 TAB] 90 tablet 2    Sig: TAKE 1 TABLET BY MOUTH 3 TIMES DAILY AS NEEDED FOR ANXIETY    Date of patient request: 10/26/2017 Last office visit: 08/25/2017 Date of last refill: 03/15/2017 Last refill amount: 90 refills 2  Follow up time period per chart:

## 2017-11-01 MED FILL — clonazePAM 0.5 MG TABS: 0.5 | 30 days supply | Qty: 90 | Fill #0

## 2017-11-01 NOTE — Telephone Encounter (Signed)
Today I have utilized the [place] Controlled Substance Registry's online query to confirm compliance regarding the patient's controlled medications. My review reveals appropriate prescription fills and that I am the sole provider of these medications. Rechecks will occur regularly and the patient is aware of our use of the system.

## 2017-11-03 ENCOUNTER — Inpatient Hospital Stay
Admission: RE | Admit: 2017-11-03 | Discharge: 2017-11-03 | Disposition: A | Discharge status: Home / Self Care | Payer: Self-pay | Source: Ambulatory Visit | Attending: Family Medicine | Admitting: Family Medicine

## 2017-11-04 MED FILL — ONDANSETRON ODT 4 MG TABLET: 4 | 5 days supply | Qty: 20 | Fill #2

## 2017-11-09 MED FILL — AMPHETAMINE-DEXTROAMPHETAMI: 10 | 30 days supply | Qty: 60 | Fill #0

## 2017-12-02 ENCOUNTER — Other Ambulatory Visit: Payer: Self-pay | Admitting: Family Medicine

## 2017-12-02 MED FILL — DESVENLAFAXINE SUC ER 100 M: 100 | 30 days supply | Qty: 30 | Fill #3

## 2017-12-02 MED FILL — TOPIRAMATE 50 MG TABLET: 50 | 30 days supply | Qty: 45 | Fill #4

## 2017-12-03 NOTE — Telephone Encounter (Signed)
Pt is here inquiring about her oxycodone refill.  I didn't see a new script for this month but I did see one for Adderall.  She states she has about 20 left.

## 2017-12-03 NOTE — Telephone Encounter (Signed)
Today I have utilized the [place] Controlled Substance Registry's online query to confirm compliance regarding the patient's controlled medications. My review reveals appropriate prescription fills and that I am the sole provider of these medications. Rechecks will occur regularly and the patient is aware of our use of the system.  Oxycodone 5 written 09/08/17 was filled 7/3 and 8/19. Ok to refill.  Needs OV for additional refills.

## 2017-12-03 NOTE — Telephone Encounter (Signed)
Sorry for the miscommunication - when I wrote ok to refill it was late last night, I actually didn't have my phone on me so wasn't able to send in by e-rx as needs 2 authenticator signatures which requires Epic AND a program on prescriber's cell phone to be active simultaneously (and wasn't at clinic to print it). So I noted that I WOULD refill it but hadn't actually done so yet - very sorry for the mix-up. - luckily, sounds like she has plenty of medication to make it through the weekend, and she can pick up the medication at [facility] anytime Mon 9/30 or after.

## 2017-12-06 MED FILL — oxyCODONE HCL 5 MG TABS: 5 | 15 days supply | Qty: 120 | Fill #0

## 2017-12-08 ENCOUNTER — Other Ambulatory Visit: Payer: Self-pay

## 2017-12-08 ENCOUNTER — Encounter (HOSPITAL_BASED_OUTPATIENT_CLINIC_OR_DEPARTMENT_OTHER): Payer: Self-pay

## 2017-12-08 ENCOUNTER — Emergency Department (HOSPITAL_BASED_OUTPATIENT_CLINIC_OR_DEPARTMENT_OTHER): Payer: Self-pay

## 2017-12-08 DIAGNOSIS — Z931 Gastrostomy status: Secondary | ICD-10-CM | POA: Insufficient documentation

## 2017-12-08 DIAGNOSIS — R1013 Epigastric pain: Secondary | ICD-10-CM | POA: Insufficient documentation

## 2017-12-08 DIAGNOSIS — Z79899 Other long term (current) drug therapy: Secondary | ICD-10-CM | POA: Insufficient documentation

## 2017-12-08 NOTE — ED Triage Notes (Addendum)
C/o CP, epigastric pain x 2 hours-pt entered triage in w/c-clutching chest,rocking back and forth-hyperventilating-was encouraged to take slow deep breaths

## 2017-12-09 ENCOUNTER — Emergency Department (HOSPITAL_BASED_OUTPATIENT_CLINIC_OR_DEPARTMENT_OTHER)
Admission: EM | Admit: 2017-12-09 | Discharge: 2017-12-09 | Disposition: A | Discharge status: Home / Self Care | Payer: Self-pay | Attending: Emergency Medicine | Admitting: Emergency Medicine

## 2017-12-09 ENCOUNTER — Emergency Department (HOSPITAL_BASED_OUTPATIENT_CLINIC_OR_DEPARTMENT_OTHER): Payer: Self-pay

## 2017-12-09 ENCOUNTER — Telehealth: Payer: Self-pay | Admitting: Family Medicine

## 2017-12-09 DIAGNOSIS — R1013 Epigastric pain: Secondary | ICD-10-CM

## 2017-12-09 DIAGNOSIS — Z931 Gastrostomy status: Secondary | ICD-10-CM

## 2017-12-09 LAB — CBC
HCT: 37.6 % (ref 36.0–46.0)
HEMOGLOBIN: 12.7 g/dL (ref 12.0–15.0)
MCH: 31.8 pg (ref 26.0–34.0)
MCHC: 33.8 g/dL (ref 30.0–36.0)
MCV: 94 fL (ref 78.0–100.0)
Platelets: 264 10*3/uL (ref 150–400)
RBC: 4 MIL/uL (ref 3.87–5.11)
RDW: 12.7 % (ref 11.5–15.5)
WBC: 12.3 10*3/uL — ABNORMAL HIGH (ref 4.0–10.5)

## 2017-12-09 LAB — URINALYSIS, ROUTINE W REFLEX MICROSCOPIC
Bilirubin Urine: NEGATIVE
Glucose, UA: NEGATIVE mg/dL
HGB URINE DIPSTICK: NEGATIVE
KETONES UR: 15 mg/dL — AB
LEUKOCYTES UA: NEGATIVE
Nitrite: NEGATIVE
PROTEIN: NEGATIVE mg/dL
Specific Gravity, Urine: >1.03 — ABNORMAL HIGH (ref 1.005–1.030)
pH: 5 (ref 5.0–8.0)

## 2017-12-09 LAB — BASIC METABOLIC PANEL
ANION GAP: 10 (ref 5–15)
BUN: 18 mg/dL (ref 6–20)
CALCIUM: 8.8 mg/dL — AB (ref 8.9–10.3)
CO2: 23 mmol/L (ref 22–32)
CREATININE: 1.4 mg/dL — AB (ref 0.44–1.00)
Chloride: 105 mmol/L (ref 98–111)
GFR calc non Af Amer: 44 mL/min — ABNORMAL LOW (ref >60)
GFR, EST AFRICAN AMERICAN: 51 mL/min — AB (ref >60)
Glucose, Bld: 77 mg/dL (ref 70–99)
POTASSIUM: 4.7 mmol/L (ref 3.5–5.1)
Sodium: 138 mmol/L (ref 135–145)

## 2017-12-09 LAB — D-DIMER, QUANTITATIVE (NOT AT ARMC): D DIMER QUANT: 0.38 ug{FEU}/mL (ref 0.00–0.50)

## 2017-12-09 LAB — TROPONIN I: Troponin I: <0.03 ng/mL (ref <0.03)

## 2017-12-09 LAB — PREGNANCY, URINE: Preg Test, Ur: NEGATIVE

## 2017-12-09 LAB — I-STAT CG4 LACTIC ACID, ED: LACTIC ACID, VENOUS: 1.58 mmol/L (ref 0.5–1.9)

## 2017-12-09 LAB — LIPASE, BLOOD: Lipase: 27 U/L (ref 11–51)

## 2017-12-09 MED ORDER — MAGIC MOUTHWASH W/LIDOCAINE: 5.0000 mL | Freq: Three times a day (TID) | ORAL | 0 refills | Status: DC | PRN

## 2017-12-09 MED ORDER — HYDROMORPHONE HCL 1 MG/ML IJ SOLN
1.0000 mg | Freq: Once | INTRAMUSCULAR | Status: AC
  Administered 2017-12-09: 1 mg via INTRAVENOUS
  Filled 2017-12-09: qty 1

## 2017-12-09 MED ORDER — ONDANSETRON HCL 4 MG/2ML IJ SOLN
4.0000 mg | Freq: Once | INTRAMUSCULAR | Status: AC
  Administered 2017-12-09: 4 mg via INTRAVENOUS
  Filled 2017-12-09: qty 2

## 2017-12-09 MED ORDER — ONDANSETRON 4 MG PO TBDP: 4.0000 mg | ORAL_TABLET | Freq: Three times a day (TID) | ORAL | 0 refills | Status: DC | PRN

## 2017-12-09 MED ORDER — SODIUM CHLORIDE 0.9 % IV BOLUS
1000.0000 mL | Freq: Once | INTRAVENOUS | Status: AC
  Administered 2017-12-09: 1000 mL via INTRAVENOUS

## 2017-12-09 MED ORDER — GI COCKTAIL ~~LOC~~
30.0000 mL | Freq: Once | ORAL | Status: AC
  Administered 2017-12-09: 30 mL via ORAL

## 2017-12-09 MED ORDER — FAMOTIDINE IN NACL 20-0.9 MG/50ML-% IV SOLN
20.0000 mg | Freq: Once | INTRAVENOUS | Status: AC
  Administered 2017-12-09: 20 mg via INTRAVENOUS
  Filled 2017-12-09: qty 50

## 2017-12-09 MED ORDER — IOPAMIDOL (ISOVUE-300) INJECTION 61%
100.0000 mL | Freq: Once | INTRAVENOUS | Status: AC | PRN
  Administered 2017-12-09: 100 mL via INTRAVENOUS

## 2017-12-09 MED ORDER — DIPHENHYDRAMINE HCL 50 MG/ML IJ SOLN
25.0000 mg | Freq: Once | INTRAMUSCULAR | Status: AC
  Administered 2017-12-09: 25 mg via INTRAVENOUS
  Filled 2017-12-09: qty 1

## 2017-12-09 NOTE — Telephone Encounter (Signed)
Copied from [id number] [phone]. Topic: General - Other >> Dec 09, 2017  4:23 PM Leward Quan A wrote: Erroriones encounter

## 2017-12-09 NOTE — Discharge Instructions (Addendum)
YourTesting today is reassuring.  There is no evidence of any damage done to your heart.  Test for blood clots is negative.  Your CT scan shows stable postsurgical changes.  Take your medications as prescribed and follow-up with your doctors.  Return to the ED if you develop new or worsening symptoms.

## 2017-12-09 NOTE — ED Provider Notes (Addendum)
[facility] EMERGENCY DEPARTMENT Provider Note   CSN: [id number] Arrival date & time: 12/08/17  2147     History   Chief Complaint Chief Complaint  Patient presents with  . Chest Pain    HPI Margaret Fletcher is a 47 y.o. female.  Patient presents with severe epigastric pain that radiated into her chest.  This onset around 6 PM while she was resting.  Loss of about 45 minutes and then resolved.  She has had this pain in the past but never this bad.  Associated with dry heaving and nausea.  Pain returned and prompted her visit to the ED.  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  She had dry heaving but no vomiting.  No diarrhea.  No fever.  Her pain is in her epigastrium and radiates to her upper back.  Previous cholecystectomy and appendectomy.  She is concerned that her G-tube might be misplaced from her dry heaving.  She normally takes pain medication at home but is not controlling her pain.  She does not have any chest pain at this time.  The history is provided by the patient and a relative.    Past Medical History:  Diagnosis Date  . Alopecia   . Anxiety   . Autoimmune disease ([place])    "Alopecia"  . Bacterial vaginosis   . Cyst of spleen   . Depression   . Frequent UTI   . Hypertrophy of breast   . IIH (idiopathic intracranial hypertension) 2017   "related to headaches"  . Migraine headache    denies, ruled out - Propranolol.Using for headaches    Patient Active Problem List   Diagnosis Date Noted  . Reactive depression 06/13/2017  . Chronic pain syndrome 06/13/2017  . High risk medications (not anticoagulants) long-term use 06/13/2017  . Empty sella syndrome ([place]) 05/01/2017  . Hypoglycemia after GI (gastrointestinal) surgery 05/01/2017  . Adjustment disorder with mixed anxiety and depressed mood 10/13/2016  . 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 Hypokalemia  02/22/2016  . Constipation, chronic 02/22/2016  . Memory loss due to medical condition 12/14/2015  . Chronic fatigue 12/14/2015  . Recurrent dehydration 09/30/2015  . G tube feedings ([place]) 09/13/2015  . Chronic nausea 08/23/2015  . Abdominal pain, chronic, epigastric 08/20/2015  . Protein-calorie malnutrition, moderate ([place]) 08/19/2015  . Nausea 08/08/2015  . Mild obstructive sleep apnea 07/08/2015  . Dyslipidemia 07/08/2015  . S/P gastric bypass 07/08/2015  . IIH (idiopathic intracranial hypertension) 06/03/2015  . Worsening headaches 04/30/2015  . Vision changes 04/30/2015  . Perceived hearing changes 04/30/2015  . Chronic daily headache 04/21/2014  . Intractable migraine without aura and without status migrainosus 04/29/2013  . Liver lesion 03/21/2013  . Splenic cyst 03/21/2013  . Abdominal pain 03/21/2013  . Vulvitis 04/22/2011    Past Surgical History:  Procedure Laterality Date  . APPENDECTOMY    . BREAST EXCISIONAL BIOPSY Right 2017   removed at time of reduction  . BREAST REDUCTION SURGERY Bilateral 03/26/2015   Procedure: BILATERAL BREAST REDUCTION  ;  Surgeon: Youlanda Roys, MD;  Location: [facility];  Service: Plastics;  Laterality: Bilateral;  . BREATH TEK H PYLORI N/A 05/21/2014   Procedure: BREATH TEK H PYLORI;  Surgeon: Greer Pickerel, MD;  Location: [facility];  Service: General;  Laterality: N/A;  . CESAREAN SECTION    . CESAREAN SECTION    . CHOLECYSTECTOMY    .  DILITATION & CURRETTAGE/HYSTROSCOPY WITH NOVASURE ABLATION N/A 07/19/2012   Procedure: HYSTEROSCOPY WITH NOVASURE ABLATION;  Surgeon: Farrel Gobble. Harrington Challenger, MD;  Location: [facility];  Service: Gynecology;  Laterality: N/A;  . ENDOMETRIAL ABLATION    . ESOPHAGOGASTRODUODENOSCOPY N/A 08/16/2015   Procedure: UPPER ESOPHAGOGASTRODUODENOSCOPY (EGD);  Surgeon: Greer Pickerel, MD;  Location: [facility];  Service: General;  Laterality: N/A;  . ESOPHAGOGASTRODUODENOSCOPY N/A 02/21/2016   Procedure:  ESOPHAGOGASTRODUODENOSCOPY (EGD);  Surgeon: Greer Pickerel, MD;  Location: [facility];  Service: General;  Laterality: N/A;  . ESOPHAGOGASTRODUODENOSCOPY N/A 02/24/2017   Procedure: ESOPHAGOGASTRODUODENOSCOPY (EGD);  Surgeon: Greer Pickerel, MD;  Location: [facility];  Service: General;  Laterality: N/A;  . IR GENERIC HISTORICAL  10/04/2015   IR [place] Vivianne Master 10/04/2015 Markus Daft, MD [facility]  . IR GENERIC HISTORICAL  01/31/2016   IR [place] GASTRO/COLONIC TUBE PERCUT W/FLUORO 01/31/2016 Corrie Mckusick, DO [facility]  . IR GENERIC HISTORICAL  04/06/2016   IR [place] TUBE PERCUT W/FLUORO 04/06/2016 Arne Cleveland, MD [facility]  . IR GENERIC HISTORICAL  05/14/2016   IR GASTRIC TUBE PERC CHG W/O IMG GUIDE 05/14/2016 Sandi Mariscal, MD [facility]  . LAPAROSCOPIC GASTROSTOMY N/A 09/30/2015   Procedure: LAPAROSCOPIC GASTROSTOMY TUBE PLACEMENT;  Surgeon: Greer Pickerel, MD;  Location: [facility];  Service: General;  Laterality: N/A;  . LAPAROSCOPIC ROUX-EN-Y GASTRIC BYPASS WITH HIATAL HERNIA REPAIR N/A 07/08/2015   Procedure: LAPAROSCOPIC ROUX-EN-Y GASTRIC BYPASS WITH HIATAL HERNIA REPAIR WITH UPPER ENDOSCOPY;  Surgeon: Greer Pickerel, MD;  Location: [facility];  Service: General;  Laterality: N/A;  . LAPAROSCOPIC SMALL BOWEL RESECTION N/A 09/30/2015   Procedure: LAPAROSCOPIC REVISION OF ROUX LIMB;  Surgeon: Greer Pickerel, MD;  Location: [facility];  Service: General;  Laterality: N/A;  . LAPAROSCOPY N/A 09/30/2015   Procedure: LAPAROSCOPY DIAGNOSTIC;  Surgeon: Greer Pickerel, MD;  Location: [facility];  Service: General;  Laterality: N/A;  . REDUCTION MAMMAPLASTY Bilateral 2017  . TUBAL LIGATION    . WISDOM TOOTH EXTRACTION       OB History   None      Home Medications    Prior to Admission medications   Medication Sig Start Date End Date Taking? Authorizing Provider  amitriptyline (ELAVIL) 10 MG tablet Take 2 tablets (20 mg total) by mouth at bedtime. 09/14/16   Melvenia Beam, MD   amphetamine-dextroamphetamine (ADDERALL) 10 MG tablet Take 1 tablet (10 mg total) by mouth 2 (two) times daily with a meal. 10/01/17   Shawnee Knapp, MD  amphetamine-dextroamphetamine (ADDERALL) 10 MG tablet Take 1 tablet (10 mg total) by mouth 2 (two) times daily with a meal. 10/29/17   Shawnee Knapp, MD  amphetamine-dextroamphetamine (ADDERALL) 10 MG tablet Take 1 tablet (10 mg total) by mouth 2 (two) times daily. 11/29/17   Shawnee Knapp, MD  BAYER MICROLET LANCETS lancets Use as instructed 06/13/17   Shawnee Knapp, MD  CALCIUM-VITAMIN D PO Take 1 tablet by mouth 2 (two) times daily. chewable    [provider]  clonazePAM (KLONOPIN) 0.5 MG tablet TAKE 1 TABLET BY MOUTH 3 TIMES DAILY AS NEEDED FOR ANXIETY 11/01/17   Shawnee Knapp, MD  clotrimazole (MYCELEX) 10 MG troche Take 1 tablet (10 mg total) by mouth 5 (five) times daily. Let dissolve in mouth 03/22/17   Shawnee Knapp, MD  Cyanocobalamin (VITAMIN B-12 SL) Place 1 drop under the tongue daily. 1 dropperful    [provider]  cyclobenzaprine (FLEXERIL) 5 MG tablet Take 1  tablet (5 mg total) by mouth 3 (three) times daily as needed for muscle spasms. May take 2 tabs po qhs 06/10/17   Shawnee Knapp, MD  desvenlafaxine (PRISTIQ) 100 MG 24 hr tablet Take 1 tablet (100 mg total) by mouth daily. 06/13/17   Shawnee Knapp, MD  docusate sodium (COLACE) 100 MG capsule Take 100 mg by mouth daily.    [provider]  eletriptan (RELPAX) 40 MG tablet Take 1 tablet (40 mg total) by mouth every 2 (two) hours as needed for migraine or headache. Do not use >2 doses/24 hours 01/07/17   Shawnee Knapp, MD  glucose blood (CONTOUR NEXT TEST) test strip 1 each by Other route 4 (four) times daily as needed (hypoglycemia). K91.2 06/10/17   Shawnee Knapp, MD  lactulose (CHRONULAC) 10 GM/15ML solution Take 30 g by mouth daily as needed for moderate constipation.  01/14/16   [provider]  linaclotide Rolan Lipa) 145 MCG CAPS capsule Take 1 capsule (145 mcg total) by  mouth daily before breakfast. As needed for chronic constipation 10/26/16   Shawnee Knapp, MD  Multiple Vitamins-Minerals (BARIATRIC MULTIVITAMINS/IRON PO) Take 1 tablet by mouth 3 (three) times daily.    [provider]  Nutritional Supplements (FEEDING SUPPLEMENT, VITAL HIGH PROTEIN,) LIQD liquid Place 1,000 mLs into feeding tube daily. 79ml/hr for 8hrs a day as needed for poor oral intake; 300 ml free water q4hr Patient taking differently: Place 1,000 mLs into feeding tube daily. 22ml/hr for 8hrs a day as needed for poor oral intake; 300 ml free water q4hr 02/24/16   Greer Pickerel, MD  ondansetron (ZOFRAN-ODT) 4 MG disintegrating tablet Take 1 tablet (4 mg total) by mouth every 6 (six) hours as needed for nausea. 02/24/16   Greer Pickerel, MD  oxyCODONE (OXY IR/ROXICODONE) 5 MG immediate release tablet TAKE 1-2 TABLETS (5-10 MG TOTAL) BY MOUTH EVERY 6 HOURS AS NEEDED FOR SEVERE PAIN. EFFECTIVE 07/12/17 09/08/17   Shawnee Knapp, MD  oxyCODONE (OXY IR/ROXICODONE) 5 MG immediate release tablet TAKE 1-2 TABLETS BY MOUTH EVERY 6 HOURS AS NEEDED FOR SEVERE PAIN.**DO NOT FILL UNTIL 10/08/17** 12/03/17   Shawnee Knapp, MD  pantoprazole (PROTONIX) 40 MG tablet Take 40 mg by mouth daily.    [provider]  polyethylene glycol (MIRALAX / GLYCOLAX) packet Take 17 g by mouth daily as needed (for constipation.).     [provider]  simethicone (MYLICON) 80 MG chewable tablet Chew 80 mg by mouth every 6 (six) hours as needed for flatulence.    [provider]  topiramate (TOPAMAX) 50 MG tablet Take 1.5 tablets (75 mg total) by mouth daily. 02/22/17   Ward Givens, NP  [name] 150-35 MCG/24HR transdermal patch Place 1 patch onto the skin every Sunday.  02/11/16   [provider]    Family History Family History  Problem Relation Age of Onset  . Cancer Mother        late 15'-50  . Breast cancer Mother        late 42'-50  . Stroke Father   . Diabetes Brother   . Seizures Son    . Breast cancer Maternal Aunt        late 40'-50  . Breast cancer Maternal Aunt        late 40'-50  . Breast cancer Maternal Aunt        late 40'-50  . Migraines Neg Hx     Social History Social History   Tobacco  Use  . Smoking status: Never Smoker  . Smokeless tobacco: Never Used  Substance Use Topics  . Alcohol use: Yes    Comment: socially  . Drug use: No     Allergies   Sulfa antibiotics; Metoclopramide; and Zosyn [piperacillin sod-tazobactam so]   Review of Systems Review of Systems  Constitutional: Positive for activity change and appetite change. Negative for fever.  HENT: Negative for dental problem, sinus pressure and sinus pain.   Eyes: Negative for visual disturbance.  Respiratory: Positive for chest tightness. Negative for shortness of breath.   Cardiovascular: Positive for chest pain.  Gastrointestinal: Positive for abdominal pain, nausea and vomiting. Negative for diarrhea.  Genitourinary: Negative for dysuria, hematuria, vaginal bleeding and vaginal discharge.  Musculoskeletal: Negative for arthralgias and myalgias.  Skin: Negative for wound.  Neurological: Negative for dizziness, weakness, light-headedness and headaches.    all other systems are negative except as noted in the HPI and PMH.    Physical Exam Updated Vital Signs BP 134/86 (BP Location: Right Arm)   Pulse 64   Temp 98.1 F (36.7 C) (Oral)   Resp 18   Ht 5\' 8"  (1.727 m)   Wt 82.1 kg   SpO2 100%   BMI 27.52 kg/m   Physical Exam  Constitutional: She is oriented to person, place, and time. She appears well-developed and well-nourished. No distress.  HENT:  Head: Normocephalic and atraumatic.  Mouth/Throat: Oropharynx is clear and moist. No oropharyngeal exudate.  Eyes: Pupils are equal, round, and reactive to light. Conjunctivae and EOM are normal.  Neck: Normal range of motion. Neck supple.  No meningismus.  Cardiovascular: Normal rate, regular rhythm, normal heart sounds and  intact distal pulses.  No murmur heard. Pulmonary/Chest: Effort normal and breath sounds normal. No respiratory distress. She exhibits no tenderness.  Abdominal: Soft. There is tenderness. There is guarding. There is no rebound.  Epigastric tenderness with voluntary guarding.  G-tube in place. No RUQ tenderness  Musculoskeletal: Normal range of motion. She exhibits no edema or tenderness.  Neurological: She is alert and oriented to person, place, and time. No cranial nerve deficit. She exhibits normal muscle tone. Coordination normal.  No ataxia on finger to nose bilaterally. No pronator drift. 5/5 strength throughout. CN 2-12 intact.Equal grip strength. Sensation intact.   Skin: Skin is warm. Capillary refill takes less than 2 seconds.  Psychiatric: She has a normal mood and affect. Her behavior is normal.  Nursing note and vitals reviewed.    ED Treatments / Results  Labs (all labs ordered are listed, but only abnormal results are displayed) Labs Reviewed  BASIC METABOLIC PANEL - Abnormal; Notable for the following components:      Result Value   Creatinine, Ser 1.40 (*)    Calcium 8.8 (*)    GFR calc non Af Amer 44 (*)    GFR calc Af Amer 51 (*)    All other components within normal limits  CBC - Abnormal; Notable for the following components:   WBC 12.3 (*)    All other components within normal limits  URINALYSIS, ROUTINE W REFLEX MICROSCOPIC - Abnormal; Notable for the following components:   Specific Gravity, Urine >1.030 (*)    Ketones, ur 15 (*)    All other components within normal limits  TROPONIN I  PREGNANCY, URINE  LIPASE, BLOOD  D-DIMER, QUANTITATIVE (NOT AT [facility])  TROPONIN I  I-STAT CG4 LACTIC ACID, ED    EKG EKG Interpretation  Date/Time:  Wednesday December 08 2017  22:11:27 EDT Ventricular Rate:  79 PR Interval:  132 QRS Duration: 76 QT Interval:  362 QTC Calculation: 415 R Axis:   19 Text Interpretation:  Normal sinus rhythm Normal ECG No  significant change was found Confirmed by Ezequiel Essex [phone]) on 12/09/2017 1:23:14 AM   Radiology Dg Chest 2 View  Result Date: 12/08/2017 CLINICAL DATA:  Epigastric chest pain, gastric bypass, gastrostomy EXAM: CHEST - 2 VIEW COMPARISON:  03/22/2017 FINDINGS: The heart size and mediastinal contours are within normal limits. Both lungs are clear. The visualized skeletal structures are unremarkable. Remote cholecystectomy. Gastrostomy left upper quadrant. Nonobstructive bowel gas pattern. IMPRESSION: No active cardiopulmonary disease. Electronically Signed   By: Jerilynn Mages.  Shick M.D.   On: 12/08/2017 22:39   Ct Abdomen Pelvis W Contrast  Result Date: 12/09/2017 CLINICAL DATA:  47 y/o F; 9 hours of epigastric abdominal pain. Status post gastric bypass. EXAM: CT ABDOMEN AND PELVIS WITH CONTRAST TECHNIQUE: Multidetector CT imaging of the abdomen and pelvis was performed using the standard protocol following bolus administration of intravenous contrast. CONTRAST:  110mL ISOVUE-300 IOPAMIDOL (ISOVUE-300) INJECTION 61% COMPARISON:  12/07/2016 CT abdomen and pelvis. FINDINGS: Lower chest: No acute abnormality. Hepatobiliary: Stable 10 mm hypodensity within the right lobe of the liver with interrupted peripheral nodular enhancement on prior CT of abdomen and pelvis compatible with hemangioma. Subcentimeter cyst in liver segment 4 a. cholecystectomy. Mild periportal lucency throughout the liver. No dilatation of the common bile duct. Pancreas: Unremarkable. No pancreatic ductal dilatation or surrounding inflammatory changes. Spleen: Normal in size without focal abnormality. Adrenals/Urinary Tract: Adrenal glands are unremarkable. Kidneys are normal, without renal calculi, focal lesion, or hydronephrosis. Bladder is unremarkable. Stomach/Bowel: Gastric bypass patent anastomosis in the left upper quadrant. No obstructive or inflammatory changes of the small or large bowel. There is a gastrostomy tube with balloon  centered within the body of stomach lumen. Vascular/Lymphatic: No significant vascular findings are present. No enlarged abdominal or pelvic lymph nodes. Reproductive: 4.9 cm myoma within the right fundus of uterus. Other: No abdominal wall hernia or abnormality. No abdominopelvic ascites. Musculoskeletal: No fracture is seen. Mild lumbar spondylosis with loss of intervertebral disc space height and disc bulges at the L4-5 and L5-S1 levels. IMPRESSION: 1. Mild periportal lucency throughout the liver may represent underlying hepatitis or possibly compensatory biliary ductal dilatation in the setting of cholecystectomy. 2. Stable right lobe of liver 10 mm hemangioma. 3. Gastric bypass with patent anastomoses. 4. Stable uterine myoma. Electronically Signed   By: Kristine Garbe M.D.   On: 12/09/2017 04:46   Dg Abd 2 Views  Result Date: 12/09/2017 CLINICAL DATA:  Epigastric pain, evaluate G-tube location EXAM: ABDOMEN - 2 VIEW COMPARISON:  CT abdomen/pelvis dated 12/07/2016 FINDINGS: Oral contrast administered via indwelling gastrostomy tube opacifies the proximal duodenum, confirming intraluminal placement. Nonobstructive bowel gas pattern. No evidence of free air under the diaphragm on the upright view. Cholecystectomy clips. Moderate right colonic stool burden. IMPRESSION: Contrast opacifies the proximal duodenum, confirming intraluminal gastrostomy placement. Electronically Signed   By: Julian Hy M.D.   On: 12/09/2017 02:25    Procedures Procedures (including critical care time)  Medications Ordered in ED Medications - No data to display   Initial Impression / Assessment and Plan / ED Course  I have reviewed the triage vital signs and the nursing notes.  Pertinent labs & imaging results that were available during my care of the patient were reviewed by me and considered in my medical decision making (see chart for details).  Patient with upper abdominal pain with nausea and dry  heaving.  Similar to previous.  G-tube in place.  EKG is sinus rhythm. Recurrent episodes of this pain after her Roux-en-Y surgery several years ago.  She is also concerned about the functionality of her G-tube.  EGD in December showed normal esophagus status post Roux-en-Y surgery.  EKG is sinus rhythm.  Patient with tenderness to palpation in her epigastrium without peritoneal signs.  Her G-tube is in the appropriate place on x-ray.  There is no bowel obstruction or free air.  Triage labs show mild leukocytosis.  Lipase normal.  Troponin negative, d-dimer negative. Lactate normal.  With ongoing epigastric pain and complicated surgical history, will obtain CT scan.  Patient's pain and nausea have improved. she was hydrated in the ED.  CT scan shows stable postsurgical changes of gastric bypass and stable liver hemangioma. There is periportal lucency throughout the liver which is thought to be secondary to her previous previous cholecystectomy.  On recheck, patient is improved.  Troponin is negative x2.  She is tolerating p.o. Low suspicion for ACS, PE, aortic dissection. She states she has her pain medication and PPI at home.  She appears stable for outpatient follow-up with her specialist.  Return precautions discussed.  Addendum: After discharge, it was noted that LFTs were inadvertently not ordered. Lab contacted and stated too much time has passed to add on test. Given CT findings of periportal lucency, patient will be contacted to have LFTs drawn by her PCP or back in the ED.  12/10/17 7:50am. Spoke with patient and advised of need for LFTs. She stated she would call her doctor, go to urgent care, or return to the ED if necessary.   Final Clinical Impressions(s) / ED Diagnoses   Final diagnoses:  Gastrostomy tube in place [facility])  Epigastric pain    ED Discharge Orders         Ordered    ondansetron (ZOFRAN ODT) 4 MG disintegrating tablet  Every 8 hours PRN     12/09/17 0636     magic mouthwash w/lidocaine SOLN  3 times daily PRN     12/09/17 0637           Ezequiel Essex, MD 12/10/17 1638    Ezequiel Essex, MD 12/10/17 [phone]

## 2017-12-17 NOTE — Telephone Encounter (Signed)
Information they are requesting was sent on 12/16/17

## 2017-12-17 NOTE — Telephone Encounter (Signed)
[employer] called to check medical request confirmation that was faxed over; they just want to know if the fax was received so that the documentation can be given to the proper channels to obtain records;   Claim# [id number] Fax confirmation back to the fax number listed in the first message in encounter

## 2017-12-20 ENCOUNTER — Emergency Department (HOSPITAL_COMMUNITY)
Admission: EM | Admit: 2017-12-20 | Discharge: 2017-12-21 | Disposition: A | Discharge status: Home / Self Care | Payer: Self-pay | Attending: Emergency Medicine | Admitting: Emergency Medicine

## 2017-12-20 ENCOUNTER — Emergency Department (HOSPITAL_COMMUNITY): Payer: Self-pay

## 2017-12-20 ENCOUNTER — Other Ambulatory Visit: Payer: Self-pay

## 2017-12-20 ENCOUNTER — Encounter (HOSPITAL_COMMUNITY): Payer: Self-pay | Admitting: Emergency Medicine

## 2017-12-20 DIAGNOSIS — Z4659 Encounter for fitting and adjustment of other gastrointestinal appliance and device: Secondary | ICD-10-CM

## 2017-12-20 DIAGNOSIS — K9423 Gastrostomy malfunction: Secondary | ICD-10-CM | POA: Insufficient documentation

## 2017-12-20 DIAGNOSIS — Z79899 Other long term (current) drug therapy: Secondary | ICD-10-CM | POA: Insufficient documentation

## 2017-12-20 MED ORDER — IOHEXOL 300 MG/ML  SOLN: 30.0000 mL | Freq: Once | INTRAMUSCULAR | Status: DC | PRN

## 2017-12-20 NOTE — ED Triage Notes (Signed)
Pt presents after having g-tube become removed appx 15-20 min ago. 18 fr g-tube

## 2017-12-20 NOTE — ED Notes (Signed)
Pt verbalized concern that tube is slightly moving, Dr. Johnney Killian made aware. Pt updated/

## 2017-12-20 NOTE — Discharge Instructions (Addendum)
1.  Call [facility] radiology at [phone] tomorrow to schedule your replacement of your G-tube.  An order has been placed through the emergency department.  You have difficulty scheduling, call your family doctor in the morning to assist in scheduling.

## 2017-12-20 NOTE — ED Provider Notes (Signed)
[place] DEPT Provider Note   CSN: [id number] Arrival date & time: 12/20/17  2030     History   Chief Complaint Chief Complaint  Patient presents with  . g-tube placement    HPI Margaret Fletcher is a 47 y.o. female.  HPI She reports her feeding tube came out while trying to take a shower.  Is been out for less than an hour.  Reports this has happened multiple times before. Past Medical History:  Diagnosis Date  . Alopecia   . Anxiety   . Autoimmune disease ([place])    "Alopecia"  . Bacterial vaginosis   . Cyst of spleen   . Depression   . Frequent UTI   . Hypertrophy of breast   . IIH (idiopathic intracranial hypertension) 2017   "related to headaches"  . Migraine headache    denies, ruled out - Propranolol.Using for headaches    Patient Active Problem List   Diagnosis Date Noted  . Reactive depression 06/13/2017  . Chronic pain syndrome 06/13/2017  . High risk medications (not anticoagulants) long-term use 06/13/2017  . Empty sella syndrome ([place]) 05/01/2017  . Hypoglycemia after GI (gastrointestinal) surgery 05/01/2017  . Adjustment disorder with mixed anxiety and depressed mood 10/13/2016  . 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 Hypokalemia 02/22/2016  . Constipation, chronic 02/22/2016  . Memory loss due to medical condition 12/14/2015  . Chronic fatigue 12/14/2015  . Recurrent dehydration 09/30/2015  . G tube feedings ([place]) 09/13/2015  . Chronic nausea 08/23/2015  . Abdominal pain, chronic, epigastric 08/20/2015  . Protein-calorie malnutrition, moderate ([place]) 08/19/2015  . Nausea 08/08/2015  . Mild obstructive sleep apnea 07/08/2015  . Dyslipidemia 07/08/2015  . S/P gastric bypass 07/08/2015  . IIH (idiopathic intracranial hypertension) 06/03/2015  . Worsening headaches 04/30/2015  . Vision changes 04/30/2015  . Perceived hearing changes 04/30/2015  . Chronic daily headache 04/21/2014  . Intractable migraine without  aura and without status migrainosus 04/29/2013  . Liver lesion 03/21/2013  . Splenic cyst 03/21/2013  . Abdominal pain 03/21/2013  . Vulvitis 04/22/2011    Past Surgical History:  Procedure Laterality Date  . APPENDECTOMY    . BREAST EXCISIONAL BIOPSY Right 2017   removed at time of reduction  . BREAST REDUCTION SURGERY Bilateral 03/26/2015   Procedure: BILATERAL BREAST REDUCTION  ;  Surgeon: Youlanda Roys, MD;  Location: [facility];  Service: Plastics;  Laterality: Bilateral;  . BREATH TEK H PYLORI N/A 05/21/2014   Procedure: BREATH TEK H PYLORI;  Surgeon: Greer Pickerel, MD;  Location: [facility];  Service: General;  Laterality: N/A;  . CESAREAN SECTION    . CESAREAN SECTION    . CHOLECYSTECTOMY    . DILITATION & CURRETTAGE/HYSTROSCOPY WITH NOVASURE ABLATION N/A 07/19/2012   Procedure: HYSTEROSCOPY WITH NOVASURE ABLATION;  Surgeon: Farrel Gobble. Harrington Challenger, MD;  Location: [facility];  Service: Gynecology;  Laterality: N/A;  . ENDOMETRIAL ABLATION    . ESOPHAGOGASTRODUODENOSCOPY N/A 08/16/2015   Procedure: UPPER ESOPHAGOGASTRODUODENOSCOPY (EGD);  Surgeon: Greer Pickerel, MD;  Location: [facility];  Service: General;  Laterality: N/A;  . ESOPHAGOGASTRODUODENOSCOPY N/A 02/21/2016   Procedure: ESOPHAGOGASTRODUODENOSCOPY (EGD);  Surgeon: Greer Pickerel, MD;  Location: [facility];  Service: General;  Laterality: N/A;  . ESOPHAGOGASTRODUODENOSCOPY N/A 02/24/2017   Procedure: ESOPHAGOGASTRODUODENOSCOPY (EGD);  Surgeon: Greer Pickerel, MD;  Location: [facility];  Service: General;  Laterality: N/A;  . IR GENERIC HISTORICAL  10/04/2015   IR [facility] Vivianne Master 10/04/2015 Markus Daft, MD  [facility]  . IR GENERIC HISTORICAL  01/31/2016   [facility] [place] GASTRO/COLONIC TUBE PERCUT W/FLUORO 01/31/2016 Corrie Mckusick, DO [facility]  . IR GENERIC HISTORICAL  04/06/2016   IR [place] TUBE PERCUT W/FLUORO 04/06/2016 Arne Cleveland, MD [facility]  . IR GENERIC HISTORICAL   05/14/2016   IR GASTRIC TUBE PERC CHG W/O IMG GUIDE 05/14/2016 Sandi Mariscal, MD [facility]  . LAPAROSCOPIC GASTROSTOMY N/A 09/30/2015   Procedure: LAPAROSCOPIC GASTROSTOMY TUBE PLACEMENT;  Surgeon: Greer Pickerel, MD;  Location: [facility];  Service: General;  Laterality: N/A;  . LAPAROSCOPIC ROUX-EN-Y GASTRIC BYPASS WITH HIATAL HERNIA REPAIR N/A 07/08/2015   Procedure: LAPAROSCOPIC ROUX-EN-Y GASTRIC BYPASS WITH HIATAL HERNIA REPAIR WITH UPPER ENDOSCOPY;  Surgeon: Greer Pickerel, MD;  Location: [facility];  Service: General;  Laterality: N/A;  . LAPAROSCOPIC SMALL BOWEL RESECTION N/A 09/30/2015   Procedure: LAPAROSCOPIC REVISION OF ROUX LIMB;  Surgeon: Greer Pickerel, MD;  Location: [facility];  Service: General;  Laterality: N/A;  . LAPAROSCOPY N/A 09/30/2015   Procedure: LAPAROSCOPY DIAGNOSTIC;  Surgeon: Greer Pickerel, MD;  Location: [facility];  Service: General;  Laterality: N/A;  . REDUCTION MAMMAPLASTY Bilateral 2017  . TUBAL LIGATION    . WISDOM TOOTH EXTRACTION       OB History   None      Home Medications    Prior to Admission medications   Medication Sig Start Date End Date Taking? Authorizing Provider  amitriptyline (ELAVIL) 10 MG tablet Take 2 tablets (20 mg total) by mouth at bedtime. 09/14/16  Yes Melvenia Beam, MD  amphetamine-dextroamphetamine (ADDERALL) 10 MG tablet Take 1 tablet (10 mg total) by mouth 2 (two) times daily with a meal. 10/01/17  Yes Shawnee Knapp, MD  CALCIUM-VITAMIN D PO Take 1 tablet by mouth 2 (two) times daily. chewable   Yes [provider]  clonazePAM (KLONOPIN) 0.5 MG tablet TAKE 1 TABLET BY MOUTH 3 TIMES DAILY AS NEEDED FOR ANXIETY 11/01/17  Yes Shawnee Knapp, MD  Cyanocobalamin (VITAMIN B-12 SL) Place 1 drop under the tongue daily. 1 dropperful   Yes [provider]  cyclobenzaprine (FLEXERIL) 5 MG tablet Take 1 tablet (5 mg total) by mouth 3 (three) times daily as needed for muscle spasms. May take 2 tabs po qhs 06/10/17  Yes Shawnee Knapp, MD  desvenlafaxine  (PRISTIQ) 100 MG 24 hr tablet Take 1 tablet (100 mg total) by mouth daily. 06/13/17  Yes Shawnee Knapp, MD  docusate sodium (COLACE) 100 MG capsule Take 100 mg by mouth daily.   Yes [provider]  eletriptan (RELPAX) 40 MG tablet Take 1 tablet (40 mg total) by mouth every 2 (two) hours as needed for migraine or headache. Do not use >2 doses/24 hours 01/07/17  Yes Shawnee Knapp, MD  glucose blood (CONTOUR NEXT TEST) test strip 1 each by Other route 4 (four) times daily as needed (hypoglycemia). K91.2 06/10/17  Yes Shawnee Knapp, MD  lactulose (CHRONULAC) 10 GM/15ML solution Take 30 g by mouth daily as needed for moderate constipation.  01/14/16  Yes [provider]  linaclotide Rolan Lipa) 145 MCG CAPS capsule Take 1 capsule (145 mcg total) by mouth daily before breakfast. As needed for chronic constipation 10/26/16  Yes Shawnee Knapp, MD  magic mouthwash w/lidocaine SOLN Take 5 mLs by mouth 3 (three) times daily as needed for mouth pain. 12/09/17  Yes Rancour, Annie Main, MD  Multiple Vitamins-Minerals (BARIATRIC MULTIVITAMINS/IRON PO) Take 1 tablet by mouth 3 (three) times  daily.   Yes [provider]  Nutritional Supplements (FEEDING SUPPLEMENT, VITAL HIGH PROTEIN,) LIQD liquid Place 1,000 mLs into feeding tube daily. 21ml/hr for 8hrs a day as needed for poor oral intake; 300 ml free water q4hr Patient taking differently: Place 1,000 mLs into feeding tube daily. 53ml/hr for 8hrs a day as needed for poor oral intake; 300 ml free water q4hr 02/24/16  Yes Greer Pickerel, MD  ondansetron (ZOFRAN ODT) 4 MG disintegrating tablet Take 1 tablet (4 mg total) by mouth every 8 (eight) hours as needed for nausea or vomiting. 12/09/17  Yes Rancour, Annie Main, MD  oxyCODONE (OXY IR/ROXICODONE) 5 MG immediate release tablet TAKE 1-2 TABLETS (5-10 MG TOTAL) BY MOUTH EVERY 6 HOURS AS NEEDED FOR SEVERE PAIN. EFFECTIVE 07/12/17 09/08/17  Yes Shawnee Knapp, MD  polyethylene glycol [facility] / Floria Raveling) packet Take 17 g by  mouth daily as needed (for constipation.).    Yes [provider]  simethicone (MYLICON) 80 MG chewable tablet Chew 80 mg by mouth every 6 (six) hours as needed for flatulence.   Yes [provider]  topiramate (TOPAMAX) 50 MG tablet Take 1.5 tablets (75 mg total) by mouth daily. 02/22/17  Yes Ward Givens, NP  amphetamine-dextroamphetamine (ADDERALL) 10 MG tablet Take 1 tablet (10 mg total) by mouth 2 (two) times daily with a meal. Patient not taking: Reported on 12/20/2017 10/29/17   Shawnee Knapp, MD  amphetamine-dextroamphetamine (ADDERALL) 10 MG tablet Take 1 tablet (10 mg total) by mouth 2 (two) times daily. Patient not taking: Reported on 12/20/2017 11/29/17   Shawnee Knapp, MD  BAYER MICROLET LANCETS lancets Use as instructed 06/13/17   Shawnee Knapp, MD  clotrimazole [facility]) 10 MG troche Take 1 tablet (10 mg total) by mouth 5 (five) times daily. Let dissolve in mouth Patient not taking: Reported on 12/20/2017 03/22/17   Shawnee Knapp, MD  oxyCODONE (OXY IR/ROXICODONE) 5 MG immediate release tablet TAKE 1-2 TABLETS BY MOUTH EVERY 6 HOURS AS NEEDED FOR SEVERE PAIN.**DO NOT FILL UNTIL 10/08/17** Patient not taking: Reported on 12/20/2017 12/03/17   Shawnee Knapp, MD    Family History Family History  Problem Relation Age of Onset  . Cancer Mother        late 55'-50  . Breast cancer Mother        late 79'-50  . Stroke Father   . Diabetes Brother   . Seizures Son   . Breast cancer Maternal Aunt        late 40'-50  . Breast cancer Maternal Aunt        late 40'-50  . Breast cancer Maternal Aunt        late 40'-50  . Migraines Neg Hx     Social History Social History   Tobacco Use  . Smoking status: Never Smoker  . Smokeless tobacco: Never Used  Substance Use Topics  . Alcohol use: Yes    Comment: socially  . Drug use: No     Allergies   Sulfa antibiotics; Metoclopramide; and Zosyn [piperacillin sod-tazobactam so]   Review of Systems Review of  Systems Constitutional: No recent fever chills Respiratory: No difficulty breathing  Physical Exam Updated Vital Signs BP (!) 152/103 (BP Location: Left Arm)   Pulse 70   Temp 99 F (37.2 C) (Oral)   Resp 18   Ht 5\' 8"  (1.727 m)   Wt 82.1 kg   SpO2 99%   BMI 27.52 kg/m   Physical Exam  Constitutional: She is oriented to person, place, and time. She appears well-developed and well-nourished.  Pulmonary/Chest: Effort normal.  Abdominal: Soft. She exhibits no distension. There is no tenderness. There is no guarding.  Well-healed scar for gastrostomy tube  Musculoskeletal: Normal range of motion.  Neurological: She is alert and oriented to person, place, and time. Coordination normal.  Skin: Skin is warm and dry.     ED Treatments / Results  Labs (all labs ordered are listed, but only abnormal results are displayed) Labs Reviewed - No data to display  EKG None  Radiology Dg Abdomen 1 View  Result Date: 12/20/2017 CLINICAL DATA:  Evaluate peg placement. EXAM: ABDOMEN - 1 VIEW COMPARISON:  12/09/2017 FINDINGS: Contrast is injected through the PEG tube, filling the gastric fundus and extending to the antrum. No evidence of extravasated contrast. IMPRESSION: Good appearance following contrast injection. Electronically Signed   By: Nelson Chimes M.D.   On: 12/20/2017 22:20    Procedures FEEDING TUBE REPLACEMENT Date/Time: 12/21/2017 12:06 AM Performed by: Charlesetta Shanks, MD Authorized by: Charlesetta Shanks, MD  Consent: Verbal consent obtained. Consent given by: patient Patient identity confirmed: verbally with patient Preparation: Patient was prepped and draped in the usual sterile fashion. Indications: tube dislodged Local anesthesia used: no  Anesthesia: Local anesthesia used: no  Sedation: Patient sedated: no  Tube type: gastrostomy Patient position: supine Procedure type: replacement Tube size: 12 Fr Bulb inflation volume: 10 (ml) Bulb inflation fluid:  normal saline Placement/position confirmation: x-ray Tube placement difficulty: moderate Patient tolerance: Patient tolerated the procedure well with no immediate complications Comments: Attempted placement of 51 French gastrostomy tube same as the patient's previous 2.  Tube did not pass and patient had significant pain and tolerated poorly.  Transition to a smaller 68 French Foley tube was passed without difficulty.    (including critical care time)  Medications Ordered in ED Medications  iohexol (OMNIPAQUE) 300 MG/ML solution 30 mL (has no administration in time range)     Initial Impression / Assessment and Plan / ED Course  I have reviewed the triage vital signs and the nursing notes.  Pertinent labs & imaging results that were available during my care of the patient were reviewed by me and considered in my medical decision making (see chart for details).     Foley catheter has been placed to maintain patency of the ostomy.  Patient will follow-up in the morning with her surgeon or interventional radiology for definitive replacement.  Final Clinical Impressions(s) / ED Diagnoses   Final diagnoses:  Encounter for care related to feeding tube  Gastrostomy tube dysfunction [facility])    ED Discharge Orders         Ordered    Feeding tube replacement     12/20/17 2352           Charlesetta Shanks, MD 12/21/17 0009

## 2017-12-20 NOTE — ED Notes (Signed)
Bed: WA18 Expected date:  Expected time:  Means of arrival:  Comments: Triage 1 

## 2017-12-20 NOTE — ED Notes (Signed)
RN made aware of pts elevated BP 

## 2017-12-27 ENCOUNTER — Telehealth: Payer: Self-pay | Admitting: Family Medicine

## 2017-12-27 NOTE — Telephone Encounter (Signed)
Called and spoke with pt regarding her appt on 02/08/18 with Dr. Brigitte Pulse. Pt was concerned that she was going to run out of meds. I was able to find her an ear;lier appt - 01/25/18 with Dr. Brigitte Pulse at

## 2017-12-31 ENCOUNTER — Encounter (HOSPITAL_COMMUNITY): Payer: Self-pay | Admitting: Emergency Medicine

## 2017-12-31 ENCOUNTER — Emergency Department (HOSPITAL_COMMUNITY): Payer: Self-pay

## 2017-12-31 ENCOUNTER — Emergency Department (HOSPITAL_COMMUNITY)
Admission: EM | Admit: 2017-12-31 | Discharge: 2017-12-31 | Disposition: A | Discharge status: Home / Self Care | Payer: Self-pay | Attending: Emergency Medicine | Admitting: Emergency Medicine

## 2017-12-31 DIAGNOSIS — K9423 Gastrostomy malfunction: Secondary | ICD-10-CM | POA: Insufficient documentation

## 2017-12-31 DIAGNOSIS — Z79899 Other long term (current) drug therapy: Secondary | ICD-10-CM | POA: Insufficient documentation

## 2017-12-31 DIAGNOSIS — Z4659 Encounter for fitting and adjustment of other gastrointestinal appliance and device: Secondary | ICD-10-CM

## 2017-12-31 LAB — COMPREHENSIVE METABOLIC PANEL
ALBUMIN: 3.5 g/dL (ref 3.5–5.0)
ALK PHOS: 101 U/L (ref 38–126)
ALT: 160 U/L — ABNORMAL HIGH (ref 0–44)
ANION GAP: 6 (ref 5–15)
AST: 258 U/L — ABNORMAL HIGH (ref 15–41)
BUN: 21 mg/dL — AB (ref 6–20)
CALCIUM: 8.5 mg/dL — AB (ref 8.9–10.3)
CO2: 26 mmol/L (ref 22–32)
Chloride: 109 mmol/L (ref 98–111)
Creatinine, Ser: 1.07 mg/dL — ABNORMAL HIGH (ref 0.44–1.00)
GFR calc Af Amer: >60 mL/min (ref >60)
GFR calc non Af Amer: >60 mL/min (ref >60)
GLUCOSE: 90 mg/dL (ref 70–99)
Potassium: 4.1 mmol/L (ref 3.5–5.1)
SODIUM: 141 mmol/L (ref 135–145)
Total Bilirubin: 0.6 mg/dL (ref 0.3–1.2)
Total Protein: 6.5 g/dL (ref 6.5–8.1)

## 2017-12-31 LAB — CBC WITH DIFFERENTIAL/PLATELET
Abs Immature Granulocytes: 0.02 10*3/uL (ref 0.00–0.07)
BASOS PCT: 2 %
Basophils Absolute: 0.1 10*3/uL (ref 0.0–0.1)
EOS ABS: 0.3 10*3/uL (ref 0.0–0.5)
EOS PCT: 4 %
HEMATOCRIT: 36.9 % (ref 36.0–46.0)
Hemoglobin: 11.7 g/dL — ABNORMAL LOW (ref 12.0–15.0)
Immature Granulocytes: 0 %
Lymphocytes Relative: 15 %
Lymphs Abs: 1.2 10*3/uL (ref 0.7–4.0)
MCH: 31 pg (ref 26.0–34.0)
MCHC: 31.7 g/dL (ref 30.0–36.0)
MCV: 97.6 fL (ref 80.0–100.0)
MONOS PCT: 10 %
Monocytes Absolute: 0.9 10*3/uL (ref 0.1–1.0)
NEUTROS PCT: 69 %
Neutro Abs: 5.7 10*3/uL (ref 1.7–7.7)
Platelets: 285 10*3/uL (ref 150–400)
RBC: 3.78 MIL/uL — ABNORMAL LOW (ref 3.87–5.11)
RDW: 13 % (ref 11.5–15.5)
WBC: 8.3 10*3/uL (ref 4.0–10.5)
nRBC: 0 % (ref 0.0–0.2)

## 2017-12-31 LAB — LIPASE, BLOOD: Lipase: 29 U/L (ref 11–51)

## 2017-12-31 MED ORDER — SODIUM CHLORIDE 0.9 % IV BOLUS
1000.0000 mL | Freq: Once | INTRAVENOUS | Status: AC
  Administered 2017-12-31: 1000 mL via INTRAVENOUS

## 2017-12-31 MED ORDER — MORPHINE SULFATE (PF) 4 MG/ML IV SOLN
6.0000 mg | Freq: Once | INTRAVENOUS | Status: AC
  Administered 2017-12-31: 6 mg via INTRAVENOUS
  Filled 2017-12-31: qty 2

## 2017-12-31 MED ORDER — ONDANSETRON HCL 4 MG/2ML IJ SOLN
4.0000 mg | Freq: Once | INTRAMUSCULAR | Status: AC
  Administered 2017-12-31: 4 mg via INTRAVENOUS
  Filled 2017-12-31: qty 2

## 2017-12-31 MED ORDER — SODIUM CHLORIDE 0.9 % IV SOLN
INTRAVENOUS | Status: DC
  Administered 2017-12-31: 12:00:00 via INTRAVENOUS

## 2017-12-31 MED ORDER — IOPAMIDOL (ISOVUE-300) INJECTION 61%
INTRAVENOUS | Status: AC
  Administered 2017-12-31: 50 mL via GASTROSTOMY
  Filled 2017-12-31: qty 100

## 2017-12-31 MED ORDER — IOPAMIDOL (ISOVUE-300) INJECTION 61%
100.0000 mL | Freq: Once | INTRAVENOUS | Status: AC | PRN
  Administered 2017-12-31: 100 mL via INTRAVENOUS

## 2017-12-31 MED ORDER — IOPAMIDOL (ISOVUE-300) INJECTION 61%
30.0000 mL | Freq: Once | INTRAVENOUS | Status: AC | PRN
  Administered 2017-12-31: 30 mL

## 2017-12-31 NOTE — ED Triage Notes (Signed)
Pt reports that she got a foley placed on the 14th to replace the feeding tube that came out. Reports this morning tube went all the way inside and not able to get it out. C/o lots of pain.

## 2017-12-31 NOTE — ED Provider Notes (Addendum)
[facility] Provider Note   CSN: [id number] Arrival date & time: 12/31/17  0800     History   Chief Complaint Chief Complaint  Patient presents with  . feeding tube issue    HPI Margaret Fletcher is a 47 y.o. female.  47 year old female presents with abdominal pain that began this morning.  Patient recently had her feeding tube replaced with a Foley 11 days ago.  Has had emesis which is been nonbilious or bloody.  Denies any fever chills.  States that the tube is all the way up to the hub.  Has not attempted to use it.  No treatment used prior to arrival     Past Medical History:  Diagnosis Date  . Alopecia   . Anxiety   . 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 Bacterial vaginosis   . Cyst of spleen   . Depression   . Frequent UTI   . Hypertrophy of breast   . IIH (idiopathic intracranial hypertension) 2017   "related to headaches"  . Migraine headache    denies, ruled out - Propranolol.Using for headaches    Patient Active Problem List   Diagnosis Date Noted  . Reactive depression 06/13/2017  . Chronic pain syndrome 06/13/2017  . High risk medications (not anticoagulants) long-term use 06/13/2017  . Empty sella syndrome ([place]) 05/01/2017  . Hypoglycemia after GI (gastrointestinal) surgery 05/01/2017  . Adjustment disorder with mixed anxiety and depressed mood 10/13/2016  . 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 Hypokalemia 02/22/2016  . Constipation, chronic 02/22/2016  . Memory loss due to medical condition 12/14/2015  . Chronic fatigue 12/14/2015  . Recurrent dehydration 09/30/2015  . G tube feedings ([place]) 09/13/2015  . Chronic nausea 08/23/2015  . Abdominal pain, chronic, epigastric 08/20/2015  . Protein-calorie malnutrition, moderate ([place]) 08/19/2015  . Nausea 08/08/2015  . Mild obstructive sleep apnea 07/08/2015  . Dyslipidemia 07/08/2015  . S/P gastric bypass 07/08/2015  . IIH (idiopathic intracranial  hypertension) 06/03/2015  . Worsening headaches 04/30/2015  . Vision changes 04/30/2015  . Perceived hearing changes 04/30/2015  . Chronic daily headache 04/21/2014  . Intractable migraine without aura and without status migrainosus 04/29/2013  . Liver lesion 03/21/2013  . Splenic cyst 03/21/2013  . Abdominal pain 03/21/2013  . Vulvitis 04/22/2011    Past Surgical History:  Procedure Laterality Date  . APPENDECTOMY    . BREAST EXCISIONAL BIOPSY Right 2017   removed at time of reduction  . BREAST REDUCTION SURGERY Bilateral 03/26/2015   Procedure: BILATERAL BREAST REDUCTION  ;  Surgeon: Youlanda Roys, MD;  Location: [facility];  Service: Plastics;  Laterality: Bilateral;  . BREATH TEK H PYLORI N/A 05/21/2014   Procedure: BREATH TEK H PYLORI;  Surgeon: Greer Pickerel, MD;  Location: [facility];  Service: General;  Laterality: N/A;  . CESAREAN SECTION    . CESAREAN SECTION    . CHOLECYSTECTOMY    . DILITATION & CURRETTAGE/HYSTROSCOPY WITH NOVASURE ABLATION N/A 07/19/2012   Procedure: HYSTEROSCOPY WITH NOVASURE ABLATION;  Surgeon: Farrel Gobble. Harrington Challenger, MD;  Location: [facility];  Service: Gynecology;  Laterality: N/A;  . ENDOMETRIAL ABLATION    . ESOPHAGOGASTRODUODENOSCOPY N/A 08/16/2015   Procedure: UPPER ESOPHAGOGASTRODUODENOSCOPY (EGD);  Surgeon: Greer Pickerel, MD;  Location: [facility];  Service: General;  Laterality: N/A;  . ESOPHAGOGASTRODUODENOSCOPY N/A 02/21/2016   Procedure: ESOPHAGOGASTRODUODENOSCOPY (EGD);  Surgeon: Greer Pickerel, MD;  Location: [facility];  Service: General;  Laterality: N/A;  . ESOPHAGOGASTRODUODENOSCOPY N/A  02/24/2017   Procedure: ESOPHAGOGASTRODUODENOSCOPY (EGD);  Surgeon: Greer Pickerel, MD;  Location: [facility];  Service: General;  Laterality: N/A;  . IR GENERIC HISTORICAL  10/04/2015   IR [place] Vivianne Master 10/04/2015 Markus Daft, MD [facility]  . IR GENERIC HISTORICAL  01/31/2016   IR [place] GASTRO/COLONIC TUBE PERCUT  W/FLUORO 01/31/2016 Corrie Mckusick, DO [facility]  . IR GENERIC HISTORICAL  04/06/2016   IR [place] TUBE PERCUT W/FLUORO 04/06/2016 Arne Cleveland, MD [facility]  . IR GENERIC HISTORICAL  05/14/2016   IR GASTRIC TUBE PERC CHG W/O IMG GUIDE 05/14/2016 Sandi Mariscal, MD [facility]  . LAPAROSCOPIC GASTROSTOMY N/A 09/30/2015   Procedure: LAPAROSCOPIC GASTROSTOMY TUBE PLACEMENT;  Surgeon: Greer Pickerel, MD;  Location: [facility];  Service: General;  Laterality: N/A;  . LAPAROSCOPIC ROUX-EN-Y GASTRIC BYPASS WITH HIATAL HERNIA REPAIR N/A 07/08/2015   Procedure: LAPAROSCOPIC ROUX-EN-Y GASTRIC BYPASS WITH HIATAL HERNIA REPAIR WITH UPPER ENDOSCOPY;  Surgeon: Greer Pickerel, MD;  Location: [facility];  Service: General;  Laterality: N/A;  . LAPAROSCOPIC SMALL BOWEL RESECTION N/A 09/30/2015   Procedure: LAPAROSCOPIC REVISION OF ROUX LIMB;  Surgeon: Greer Pickerel, MD;  Location: [facility];  Service: General;  Laterality: N/A;  . LAPAROSCOPY N/A 09/30/2015   Procedure: LAPAROSCOPY DIAGNOSTIC;  Surgeon: Greer Pickerel, MD;  Location: [facility];  Service: General;  Laterality: N/A;  . REDUCTION MAMMAPLASTY Bilateral 2017  . TUBAL LIGATION    . WISDOM TOOTH EXTRACTION       OB History   None      Home Medications    Prior to Admission medications   Medication Sig Start Date End Date Taking? Authorizing Provider  amitriptyline (ELAVIL) 10 MG tablet Take 2 tablets (20 mg total) by mouth at bedtime. 09/14/16   Melvenia Beam, MD  amphetamine-dextroamphetamine (ADDERALL) 10 MG tablet Take 1 tablet (10 mg total) by mouth 2 (two) times daily with a meal. 10/01/17   Shawnee Knapp, MD  amphetamine-dextroamphetamine (ADDERALL) 10 MG tablet Take 1 tablet (10 mg total) by mouth 2 (two) times daily with a meal. Patient not taking: Reported on 12/20/2017 10/29/17   Shawnee Knapp, MD  amphetamine-dextroamphetamine (ADDERALL) 10 MG tablet Take 1 tablet (10 mg total) by mouth 2 (two) times daily. Patient not taking: Reported on 12/20/2017  11/29/17   Shawnee Knapp, MD  BAYER MICROLET LANCETS lancets Use as instructed 06/13/17   Shawnee Knapp, MD  CALCIUM-VITAMIN D PO Take 1 tablet by mouth 2 (two) times daily. chewable    [provider]  clonazePAM (KLONOPIN) 0.5 MG tablet TAKE 1 TABLET BY MOUTH 3 TIMES DAILY AS NEEDED FOR ANXIETY 11/01/17   Shawnee Knapp, MD  clotrimazole (MYCELEX) 10 MG troche Take 1 tablet (10 mg total) by mouth 5 (five) times daily. Let dissolve in mouth Patient not taking: Reported on 12/20/2017 03/22/17   Shawnee Knapp, MD  Cyanocobalamin (VITAMIN B-12 SL) Place 1 drop under the tongue daily. 1 dropperful    [provider]  cyclobenzaprine (FLEXERIL) 5 MG tablet Take 1 tablet (5 mg total) by mouth 3 (three) times daily as needed for muscle spasms. May take 2 tabs po qhs 06/10/17   Shawnee Knapp, MD  desvenlafaxine (PRISTIQ) 100 MG 24 hr tablet Take 1 tablet (100 mg total) by mouth daily. 06/13/17   Shawnee Knapp, MD  docusate sodium (COLACE) 100 MG capsule Take 100 mg by mouth daily.    [provider]  eletriptan (RELPAX)  40 MG tablet Take 1 tablet (40 mg total) by mouth every 2 (two) hours as needed for migraine or headache. Do not use >2 doses/24 hours 01/07/17   Shawnee Knapp, MD  glucose blood (CONTOUR NEXT TEST) test strip 1 each by Other route 4 (four) times daily as needed (hypoglycemia). K91.2 06/10/17   Shawnee Knapp, MD  lactulose (CHRONULAC) 10 GM/15ML solution Take 30 g by mouth daily as needed for moderate constipation.  01/14/16   [provider]  linaclotide Rolan Lipa) 145 MCG CAPS capsule Take 1 capsule (145 mcg total) by mouth daily before breakfast. As needed for chronic constipation 10/26/16   Shawnee Knapp, MD  magic mouthwash w/lidocaine SOLN Take 5 mLs by mouth 3 (three) times daily as needed for mouth pain. 12/09/17   Rancour, Annie Main, MD  Multiple Vitamins-Minerals (BARIATRIC MULTIVITAMINS/IRON PO) Take 1 tablet by mouth 3 (three) times daily.    [provider]  Nutritional  Supplements (FEEDING SUPPLEMENT, VITAL HIGH PROTEIN,) LIQD liquid Place 1,000 mLs into feeding tube daily. 30ml/hr for 8hrs a day as needed for poor oral intake; 300 ml free water q4hr Patient taking differently: Place 1,000 mLs into feeding tube daily. 29ml/hr for 8hrs a day as needed for poor oral intake; 300 ml free water q4hr 02/24/16   Greer Pickerel, MD  ondansetron (ZOFRAN ODT) 4 MG disintegrating tablet Take 1 tablet (4 mg total) by mouth every 8 (eight) hours as needed for nausea or vomiting. 12/09/17   Rancour, Annie Main, MD  oxyCODONE (OXY IR/ROXICODONE) 5 MG immediate release tablet TAKE 1-2 TABLETS (5-10 MG TOTAL) BY MOUTH EVERY 6 HOURS AS NEEDED FOR SEVERE PAIN. EFFECTIVE 07/12/17 09/08/17   Shawnee Knapp, MD  oxyCODONE (OXY IR/ROXICODONE) 5 MG immediate release tablet TAKE 1-2 TABLETS BY MOUTH EVERY 6 HOURS AS NEEDED FOR SEVERE PAIN.**DO NOT FILL UNTIL 10/08/17** Patient not taking: Reported on 12/20/2017 12/03/17   Shawnee Knapp, MD  polyethylene glycol (MIRALAX / Floria Raveling) packet Take 17 g by mouth daily as needed (for constipation.).     [provider]  simethicone (MYLICON) 80 MG chewable tablet Chew 80 mg by mouth every 6 (six) hours as needed for flatulence.    [provider]  topiramate (TOPAMAX) 50 MG tablet Take 1.5 tablets (75 mg total) by mouth daily. 02/22/17   Ward Givens, NP    Family History Family History  Problem Relation Age of Onset  . Cancer Mother        late 52'-50  . Breast cancer Mother        late 30'-50  . Stroke Father   . Diabetes Brother   . Seizures Son   . Breast cancer Maternal Aunt        late 40'-50  . Breast cancer Maternal Aunt        late 40'-50  . Breast cancer Maternal Aunt        late 40'-50  . Migraines Neg Hx     Social History Social History   Tobacco Use  . Smoking status: Never Smoker  . Smokeless tobacco: Never Used  Substance Use Topics  . Alcohol use: Yes    Comment: socially  . Drug use: No      Allergies   Sulfa antibiotics; Metoclopramide; and Zosyn [piperacillin sod-tazobactam so]   Review of Systems Review of Systems  All other systems reviewed and are negative.    Physical Exam Updated Vital Signs BP (!) 146/93   Pulse 81  Temp 98.9 F (37.2 C)   Resp 17   SpO2 100%   Physical Exam  Constitutional: She is oriented to person, place, and time. She appears well-developed and well-nourished.  Non-toxic appearance. No distress.  HENT:  Head: Normocephalic and atraumatic.  Eyes: Pupils are equal, round, and reactive to light. Conjunctivae, EOM and lids are normal.  Neck: Normal range of motion. Neck supple. No tracheal deviation present. No thyroid mass present.  Cardiovascular: Normal rate, regular rhythm and normal heart sounds. Exam reveals no gallop.  No murmur heard. Pulmonary/Chest: Effort normal and breath sounds normal. No stridor. No respiratory distress. She has no decreased breath sounds. She has no wheezes. She has no rhonchi. She has no rales.  Abdominal: Soft. Normal appearance and bowel sounds are normal. She exhibits no distension. There is no tenderness. There is no rigidity, no rebound, no guarding and no CVA tenderness.    Musculoskeletal: Normal range of motion. She exhibits no edema or tenderness.  Neurological: She is alert and oriented to person, place, and time. She has normal strength. No cranial nerve deficit or sensory deficit. GCS eye subscore is 4. GCS verbal subscore is 5. GCS motor subscore is 6.  Skin: Skin is warm and dry. No abrasion and no rash noted.  Psychiatric: She has a normal mood and affect. Her speech is normal and behavior is normal.  Nursing note and vitals reviewed.    ED Treatments / Results  Labs (all labs ordered are listed, but only abnormal results are displayed) Labs Reviewed  CBC WITH DIFFERENTIAL/PLATELET  COMPREHENSIVE METABOLIC PANEL  LIPASE, BLOOD    EKG None  Radiology No results  found.  Procedures FEEDING TUBE REPLACEMENT Date/Time: 12/31/2017 2:15 PM Performed by: Lacretia Leigh, MD Authorized by: Lacretia Leigh, MD  Consent: Verbal consent obtained. Written consent not obtained. Risks and benefits: risks, benefits and alternatives were discussed Consent given by: patient Patient identity confirmed: verbally with patient Time out: Immediately prior to procedure a "time out" was called to verify the correct patient, procedure, equipment, support staff and site/side marked as required. Indications: tube malfunction Local anesthesia used: no  Anesthesia: Local anesthesia used: no  Sedation: Patient sedated: no  Tube type: gastrostomy Patient position: supine Tube size: 16 Fr Endoscope used: no Bulb inflation volume: 8 (ml) Bulb inflation fluid: sterile water Placement/position confirmation: x-ray and gastric contents aspirated Tube placement difficulty: none Patient tolerance: Patient tolerated the procedure well with no immediate complications    (including critical care time)  Medications Ordered in ED Medications  sodium chloride 0.9 % bolus 1,000 mL (has no administration in time range)  0.9 %  sodium chloride infusion (has no administration in time range)  ondansetron (ZOFRAN) injection 4 mg (has no administration in time range)  morphine 4 MG/ML injection 6 mg (has no administration in time range)     Initial Impression / Assessment and Plan / ED Course  I have reviewed the triage vital signs and the nursing notes.  Pertinent labs & imaging results that were available during my care of the patient were reviewed by me and considered in my medical decision making (see chart for details).      Patient treated with IV fluids and pain medication. Patient CT scan showed no evidence of obstruction but did show the tip of the G-tube in the intestine.  This was retracted and Gastrografin was inserted and the tube still was not in the correct  position.  Tube was then replaced by me  and Gastrografin was inserted and now shows the tube in the lumen of the stomach.  Patient to follow-up with her surgeon  Final Clinical Impressions(s) / ED Diagnoses   Final diagnoses:  None    ED Discharge Orders    None       Lacretia Leigh, MD 12/31/17 1554    Lacretia Leigh, MD 12/31/17 1556

## 2018-01-03 ENCOUNTER — Other Ambulatory Visit: Payer: Self-pay | Admitting: General Surgery

## 2018-01-03 DIAGNOSIS — K5909 Other constipation: Secondary | ICD-10-CM

## 2018-01-14 ENCOUNTER — Encounter (HOSPITAL_BASED_OUTPATIENT_CLINIC_OR_DEPARTMENT_OTHER): Payer: Self-pay | Admitting: Adult Health

## 2018-01-14 ENCOUNTER — Emergency Department (HOSPITAL_BASED_OUTPATIENT_CLINIC_OR_DEPARTMENT_OTHER): Payer: Self-pay

## 2018-01-14 ENCOUNTER — Emergency Department (HOSPITAL_BASED_OUTPATIENT_CLINIC_OR_DEPARTMENT_OTHER)
Admission: EM | Admit: 2018-01-14 | Discharge: 2018-01-14 | Disposition: A | Discharge status: Home / Self Care | Payer: Self-pay | Attending: Emergency Medicine | Admitting: Emergency Medicine

## 2018-01-14 ENCOUNTER — Other Ambulatory Visit: Payer: Self-pay

## 2018-01-14 DIAGNOSIS — X500XXA Overexertion from strenuous movement or load, initial encounter: Secondary | ICD-10-CM | POA: Insufficient documentation

## 2018-01-14 DIAGNOSIS — Y929 Unspecified place or not applicable: Secondary | ICD-10-CM | POA: Insufficient documentation

## 2018-01-14 DIAGNOSIS — Z79899 Other long term (current) drug therapy: Secondary | ICD-10-CM | POA: Insufficient documentation

## 2018-01-14 DIAGNOSIS — Y9389 Activity, other specified: Secondary | ICD-10-CM | POA: Insufficient documentation

## 2018-01-14 DIAGNOSIS — Y999 Unspecified external cause status: Secondary | ICD-10-CM | POA: Insufficient documentation

## 2018-01-14 DIAGNOSIS — S46911A Strain of unspecified muscle, fascia and tendon at shoulder and upper arm level, right arm, initial encounter: Secondary | ICD-10-CM | POA: Insufficient documentation

## 2018-01-14 NOTE — ED Triage Notes (Signed)
PResents with right shoulder and right sided neck pain after holding on to a car door while it was oulling away and then she fell backward. Did not Lose consiousness or hit head.
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
## 2018-01-21 MED FILL — AMPHETAMINE-DEXTROAMPHETAMI: 10 | 30 days supply | Qty: 60 | Fill #0

## 2018-01-21 MED FILL — ONDANSETRON ODT 4 MG TABLET: 4 | 7 days supply | Qty: 20 | Fill #0

## 2018-01-25 ENCOUNTER — Ambulatory Visit (INDEPENDENT_AMBULATORY_CARE_PROVIDER_SITE_OTHER): Payer: Self-pay | Admitting: Family Medicine

## 2018-01-25 ENCOUNTER — Other Ambulatory Visit: Payer: Self-pay

## 2018-01-25 ENCOUNTER — Ambulatory Visit (INDEPENDENT_AMBULATORY_CARE_PROVIDER_SITE_OTHER): Payer: Self-pay

## 2018-01-25 ENCOUNTER — Encounter: Payer: Self-pay | Admitting: Family Medicine

## 2018-01-25 VITALS — BP 127/86 | HR 83 | Temp 99.5°F | Resp 16 | Ht 67.0 in | Wt 183.0 lb

## 2018-01-25 DIAGNOSIS — R1013 Epigastric pain: Secondary | ICD-10-CM

## 2018-01-25 DIAGNOSIS — G894 Chronic pain syndrome: Secondary | ICD-10-CM

## 2018-01-25 DIAGNOSIS — K5909 Other constipation: Secondary | ICD-10-CM

## 2018-01-25 DIAGNOSIS — G8929 Other chronic pain: Secondary | ICD-10-CM

## 2018-01-25 DIAGNOSIS — R059 Cough, unspecified: Secondary | ICD-10-CM

## 2018-01-25 DIAGNOSIS — R413 Other amnesia: Secondary | ICD-10-CM

## 2018-01-25 DIAGNOSIS — R05 Cough: Secondary | ICD-10-CM

## 2018-01-25 DIAGNOSIS — F329 Major depressive disorder, single episode, unspecified: Secondary | ICD-10-CM

## 2018-01-25 DIAGNOSIS — J209 Acute bronchitis, unspecified: Secondary | ICD-10-CM

## 2018-01-25 DIAGNOSIS — B3781 Candidal esophagitis: Secondary | ICD-10-CM

## 2018-01-25 DIAGNOSIS — B370 Candidal stomatitis: Secondary | ICD-10-CM

## 2018-01-25 DIAGNOSIS — R7401 Elevation of levels of liver transaminase levels: Secondary | ICD-10-CM

## 2018-01-25 DIAGNOSIS — G43019 Migraine without aura, intractable, without status migrainosus: Secondary | ICD-10-CM

## 2018-01-25 DIAGNOSIS — Z79899 Other long term (current) drug therapy: Secondary | ICD-10-CM

## 2018-01-25 DIAGNOSIS — R5382 Chronic fatigue, unspecified: Secondary | ICD-10-CM

## 2018-01-25 DIAGNOSIS — R740 Nonspecific elevation of levels of transaminase and lactic acid dehydrogenase [LDH]: Secondary | ICD-10-CM

## 2018-01-25 LAB — POCT CBC
GRANULOCYTE PERCENT: 64.3 % (ref 37–80)
HCT, POC: 38.1 % (ref 29–41)
Hemoglobin: 12.5 g/dL (ref 9.5–13.5)
Lymph, poc: 1.8 (ref 0.6–3.4)
MCH, POC: 31.2 pg (ref 27–31.2)
MCHC: 32.9 g/dL (ref 31.8–35.4)
MCV: 94.8 fL (ref 76–111)
MID (cbc): 0.7 (ref 0–0.9)
MPV: 7.5 fL (ref 0–99.8)
PLATELET COUNT, POC: 266 10*3/uL (ref 142–424)
POC Granulocyte: 4.6 (ref 2–6.9)
POC LYMPH %: 25.5 % (ref 10–50)
POC MID %: 10.2 %M (ref 0–12)
RBC: 4.02 M/uL — AB (ref 4.04–5.48)
RDW, POC: 13.4 %
WBC: 7.2 10*3/uL (ref 4.6–10.2)

## 2018-01-25 LAB — POCT SKIN KOH: SKIN KOH, POC: NEGATIVE

## 2018-01-25 MED ORDER — BENZONATATE 200 MG PO CAPS: 200.0000 mg | ORAL_CAPSULE | Freq: Three times a day (TID) | ORAL | 0 refills | Status: DC | PRN

## 2018-01-25 MED ORDER — ELETRIPTAN HYDROBROMIDE 40 MG PO TABS: 40.0000 mg | ORAL_TABLET | ORAL | 1 refills | Status: DC | PRN

## 2018-01-25 MED ORDER — AMPHETAMINE-DEXTROAMPHETAMINE 15 MG PO TABS: 15.0000 mg | ORAL_TABLET | Freq: Two times a day (BID) | ORAL | 0 refills | Status: DC

## 2018-01-25 MED ORDER — OXYCODONE HCL 5 MG PO TABS: 5.0000 mg | ORAL_TABLET | Freq: Four times a day (QID) | ORAL | 0 refills | Status: DC | PRN

## 2018-01-25 MED ORDER — DESVENLAFAXINE SUCCINATE ER 100 MG PO TB24: 100.0000 mg | ORAL_TABLET | Freq: Every day | ORAL | 1 refills | Status: DC

## 2018-01-25 MED ORDER — NYSTATIN 100000 UNIT/ML MT SUSP: OROMUCOSAL | 0 refills | Status: DC

## 2018-01-25 MED ORDER — ALBUTEROL SULFATE (2.5 MG/3ML) 0.083% IN NEBU
2.5000 mg | INHALATION_SOLUTION | Freq: Once | RESPIRATORY_TRACT | Status: AC
  Administered 2018-01-25: 2.5 mg via RESPIRATORY_TRACT

## 2018-01-25 MED ORDER — AZITHROMYCIN 250 MG PO TABS: ORAL_TABLET | ORAL | 0 refills | Status: DC

## 2018-01-25 MED ORDER — ALBUTEROL SULFATE HFA 108 (90 BASE) MCG/ACT IN AERS: 2.0000 | INHALATION_SPRAY | Freq: Four times a day (QID) | RESPIRATORY_TRACT | 0 refills | Status: AC | PRN

## 2018-01-25 MED FILL — oxyCODONE HCL 5 MG TABS: 5 | 15 days supply | Qty: 120 | Fill #0

## 2018-01-25 MED FILL — ALBUTEROL SULFATE HFA 108 (: 108 (90 BAS | 25 days supply | Qty: 18 | Fill #0

## 2018-01-25 MED FILL — AZITHROMYCIN 250 MG TABLET: 250 | 5 days supply | Qty: 6 | Fill #0

## 2018-01-25 MED FILL — NYSTATIN 100,000 UNITS/ML S: 100000 | 14 days supply | Qty: 280 | Fill #0

## 2018-01-25 NOTE — Progress Notes (Signed)
Subjective:    Patient: Margaret Fletcher  DOB: [birth year]; 47 y.o.   MRN: [id number]  Chief Complaint  Patient presents with  . Chronic Conditions    HPI Had URI sev wks ago where she lost her voice, then got better for a week or two and sxs came back sev d ago (>2-3d prior) but worse this time. Started with sore throat, HA, body ache, low grade fever, ear pain.  Increased boughts of nausea. Has been using inhaler at night when cough, wheezing, and SHoB wake her up, prior rx'd cough medicine.  Feels like chest congestion is radiating to her back and is productive of yellow phlegm. Some mild DOE. Mild pain with deep inspiration.   Has not had to go into hosp JUST of IVF (though have been given them during her 4 prior ER visits for other reasons.   Did have problems with   Wants to go up on ADHD meds as feels like head is in fog - has to read things over and over. Was thinking she needs more anti-depressant but changed to taking this at night rather than during day which works better for her. Is taking clonazepam 3-4x/d and 1 at night.   Medical History Past Medical History:  Diagnosis Date  . Alopecia   . Anxiety   . Autoimmune disease ([place])    "Alopecia"  . Bacterial vaginosis   . Cyst of spleen   . Depression   . Frequent UTI   . Hypertrophy of breast   . IIH (idiopathic intracranial hypertension) 2017   "related to headaches"  . Migraine headache    denies, ruled out - Propranolol.Using for headaches   Past Surgical History:  Procedure Laterality Date  . APPENDECTOMY    . BREAST EXCISIONAL BIOPSY Right 2017   removed at time of reduction  . BREAST REDUCTION SURGERY Bilateral 03/26/2015   Procedure: BILATERAL BREAST REDUCTION  ;  Surgeon: Youlanda Roys, MD;  Location: [facility];  Service: Plastics;  Laterality: Bilateral;  . BREATH TEK H PYLORI N/A 05/21/2014   Procedure: BREATH TEK H PYLORI;  Surgeon: Greer Pickerel, MD;  Location: [facility];   Service: General;  Laterality: N/A;  . CESAREAN SECTION    . CESAREAN SECTION    . CHOLECYSTECTOMY    . DILITATION & CURRETTAGE/HYSTROSCOPY WITH NOVASURE ABLATION N/A 07/19/2012   Procedure: HYSTEROSCOPY WITH NOVASURE ABLATION;  Surgeon: Farrel Gobble. Harrington Challenger, MD;  Location: [facility];  Service: Gynecology;  Laterality: N/A;  . ENDOMETRIAL ABLATION    . ESOPHAGOGASTRODUODENOSCOPY N/A 08/16/2015   Procedure: UPPER ESOPHAGOGASTRODUODENOSCOPY (EGD);  Surgeon: Greer Pickerel, MD;  Location: [facility];  Service: General;  Laterality: N/A;  . ESOPHAGOGASTRODUODENOSCOPY N/A 02/21/2016   Procedure: ESOPHAGOGASTRODUODENOSCOPY (EGD);  Surgeon: Greer Pickerel, MD;  Location: [facility];  Service: General;  Laterality: N/A;  . ESOPHAGOGASTRODUODENOSCOPY N/A 02/24/2017   Procedure: ESOPHAGOGASTRODUODENOSCOPY (EGD);  Surgeon: Greer Pickerel, MD;  Location: [facility];  Service: General;  Laterality: N/A;  . IR GENERIC HISTORICAL  10/04/2015   IR [place] Vivianne Master 10/04/2015 Markus Daft, MD [facility]  . IR GENERIC HISTORICAL  01/31/2016   IR [place] GASTRO/COLONIC TUBE PERCUT W/FLUORO 01/31/2016 Corrie Mckusick, DO [facility]  . IR GENERIC HISTORICAL  04/06/2016   IR [place] TUBE PERCUT W/FLUORO 04/06/2016 Arne Cleveland, MD [facility]  . IR GENERIC HISTORICAL  05/14/2016   IR GASTRIC TUBE PERC CHG W/O IMG GUIDE 05/14/2016 Sandi Mariscal, MD [facility]  .  LAPAROSCOPIC GASTROSTOMY N/A 09/30/2015   Procedure: LAPAROSCOPIC GASTROSTOMY TUBE PLACEMENT;  Surgeon: Greer Pickerel, MD;  Location: [facility];  Service: General;  Laterality: N/A;  . LAPAROSCOPIC ROUX-EN-Y GASTRIC BYPASS WITH HIATAL HERNIA REPAIR N/A 07/08/2015   Procedure: LAPAROSCOPIC ROUX-EN-Y GASTRIC BYPASS WITH HIATAL HERNIA REPAIR WITH UPPER ENDOSCOPY;  Surgeon: Greer Pickerel, MD;  Location: [facility];  Service: General;  Laterality: N/A;  . LAPAROSCOPIC SMALL BOWEL RESECTION N/A 09/30/2015   Procedure: LAPAROSCOPIC REVISION OF ROUX LIMB;   Surgeon: Greer Pickerel, MD;  Location: [facility];  Service: General;  Laterality: N/A;  . LAPAROSCOPY N/A 09/30/2015   Procedure: LAPAROSCOPY DIAGNOSTIC;  Surgeon: Greer Pickerel, MD;  Location: [facility];  Service: General;  Laterality: N/A;  . REDUCTION MAMMAPLASTY Bilateral 2017  . TUBAL LIGATION    . WISDOM TOOTH EXTRACTION     Current Outpatient Medications on File Prior to Visit  Medication Sig Dispense Refill  . amitriptyline (ELAVIL) 10 MG tablet Take 2 tablets (20 mg total) by mouth at bedtime. 60 tablet 11  . amphetamine-dextroamphetamine (ADDERALL) 10 MG tablet Take 1 tablet (10 mg total) by mouth 2 (two) times daily with a meal. 60 tablet 0  . BAYER MICROLET LANCETS lancets Use as instructed 100 each 12  . CALCIUM-VITAMIN D PO Take 1 tablet by mouth 2 (two) times daily. chewable    . clonazePAM (KLONOPIN) 0.5 MG tablet TAKE 1 TABLET BY MOUTH 3 TIMES DAILY AS NEEDED FOR ANXIETY (Patient taking differently: Take 0.5 mg by mouth 3 (three) times daily as needed for anxiety. ) 90 tablet 1  . clotrimazole (MYCELEX) 10 MG troche Take 1 tablet (10 mg total) by mouth 5 (five) times daily. Let dissolve in mouth 70 tablet 0  . Cyanocobalamin (VITAMIN B-12 SL) Place 1 drop under the tongue daily. 1 dropperful    . cyclobenzaprine (FLEXERIL) 5 MG tablet Take 1 tablet (5 mg total) by mouth 3 (three) times daily as needed for muscle spasms. May take 2 tabs po qhs 40 tablet 1  . desvenlafaxine (PRISTIQ) 100 MG 24 hr tablet Take 1 tablet (100 mg total) by mouth daily. 90 tablet 1  . docusate sodium (COLACE) 100 MG capsule Take 100 mg by mouth daily.    Marland Kitchen eletriptan (RELPAX) 40 MG tablet Take 1 tablet (40 mg total) by mouth every 2 (two) hours as needed for migraine or headache. Do not use >2 doses/24 hours 10 tablet 11  . glucose blood (CONTOUR NEXT TEST) test strip 1 each by Other route 4 (four) times daily as needed (hypoglycemia). K91.2 270 each 5  . lactulose (CHRONULAC) 10 GM/15ML solution Take 30 g by  mouth daily as needed for moderate constipation.   0  . linaclotide (LINZESS) 145 MCG CAPS capsule Take 1 capsule (145 mcg total) by mouth daily before breakfast. As needed for chronic constipation 30 capsule 5  . magic mouthwash w/lidocaine SOLN Take 5 mLs by mouth 3 (three) times daily as needed for mouth pain. 50 mL 0  . Multiple Vitamins-Minerals (BARIATRIC MULTIVITAMINS/IRON PO) Take 1 tablet by mouth 3 (three) times daily.    . Nutritional Supplements (FEEDING SUPPLEMENT, VITAL HIGH PROTEIN,) LIQD liquid Place 1,000 mLs into feeding tube daily. 47ml/hr for 8hrs a day as needed for poor oral intake; 300 ml free water q4hr (Patient taking differently: Place 1,000 mLs into feeding tube daily. 63ml/hr for 8hrs a day as needed for poor oral intake; 300 ml free water q4hr) 1000 mL 8  . ondansetron (ZOFRAN  ODT) 4 MG disintegrating tablet Take 1 tablet (4 mg total) by mouth every 8 (eight) hours as needed for nausea or vomiting. 20 tablet 0  . oxyCODONE (OXY IR/ROXICODONE) 5 MG immediate release tablet TAKE 1-2 TABLETS (5-10 MG TOTAL) BY MOUTH EVERY 6 HOURS AS NEEDED FOR SEVERE PAIN. EFFECTIVE 07/12/17 (Patient taking differently: Take 5-10 mg by mouth every 6 (six) hours as needed for severe pain. ) 120 tablet 0  . polyethylene glycol (MIRALAX / GLYCOLAX) packet Take 17 g by mouth daily as needed (for constipation.).     Marland Kitchen simethicone (MYLICON) 80 MG chewable tablet Chew 80 mg by mouth every 6 (six) hours as needed for flatulence.    . topiramate (TOPAMAX) 50 MG tablet Take 1.5 tablets (75 mg total) by mouth daily. 45 tablet 11   No current facility-administered medications on file prior to visit.    Allergies  Allergen Reactions  . Sulfa Antibiotics Anaphylaxis  . Metoclopramide Other (See Comments)    Elevated prolactin >5x ULN induction lactation when taking scheduled for several months  . Zosyn [Piperacillin Sod-Tazobactam So] Rash    Has patient had a PCN reaction causing immediate rash,  facial/tongue/throat swelling, SOB or lightheadedness with hypotension: No Has patient had a PCN reaction causing severe rash involving mucus membranes or skin necrosis: No Has patient had a PCN reaction that required hospitalization: Unknown--inpatient when reaction occurred Has patient had a PCN reaction occurring within the last 10 years: Yes If all of the above answers are "NO", then may proceed with Cephalosporin use.    Family History  Problem Relation Age of Onset  . Cancer Mother        late 66'-50  . Breast cancer Mother        late 70'-50  . Stroke Father   . Diabetes Brother   . Seizures Son   . Breast cancer Maternal Aunt        late 40'-50  . Breast cancer Maternal Aunt        late 40'-50  . Breast cancer Maternal Aunt        late 40'-50  . Migraines Neg Hx    Social History   Socioeconomic History  . Marital status: Divorced    Spouse name: Not on file  . Number of children: 2  . Years of education: 50  . Highest education level: Not on file  Occupational History  . Occupation: - nurse tech  Social Needs  . Financial resource strain: Not on file  . Food insecurity:    Worry: Not on file    Inability: Not on file  . Transportation needs:    Medical: Not on file    Non-medical: Not on file  Tobacco Use  . Smoking status: Never Smoker  . Smokeless tobacco: Never Used  Substance and Sexual Activity  . Alcohol use: Yes    Comment: socially  . Drug use: No  . Sexual activity: Yes    Birth control/protection: Surgical  Lifestyle  . Physical activity:    Days per week: Not on file    Minutes per session: Not on file  . Stress: Not on file  Relationships  . Social connections:    Talks on phone: Not on file    Gets together: Not on file    Attends religious service: Not on file    Active member of club or organization: Not on file    Attends meetings of clubs or organizations: Not on file  Relationship status: Not on file  Other Topics  Concern  . Not on file  Social History Narrative   Lives with partner   Caffeine use: minimal coffee   Right-handed   Depression screen [facility] 2/9 01/25/2018 08/25/2017 06/10/2017 04/28/2017 03/22/2017  Decreased Interest 1 0 0 0 0  Down, Depressed, Hopeless 1 0 0 0 0  PHQ - 2 Score 2 0 0 0 0  Altered sleeping 3 - - - -  Tired, decreased energy 1 - - - -  Change in appetite 1 - - - -  Feeling bad or failure about yourself  1 - - - -  Trouble concentrating 0 - - - -  Moving slowly or fidgety/restless 0 - - - -  Suicidal thoughts 0 - - - -  PHQ-9 Score 8 - - - -  Difficult doing work/chores - - - - -  Some recent data might be hidden    ROS As noted in HPI  Objective:  BP 127/86   Pulse 83   Temp 99.5 F (37.5 C) (Oral)   Resp 16   Ht 5\' 7"  (1.702 m)   Wt 183 lb (83 kg)   SpO2 100%   BMI 28.66 kg/m  Physical Exam  Constitutional: She is oriented to person, place, and time. She appears well-developed and well-nourished. She appears lethargic. She appears ill. No distress.  HENT:  Head: Normocephalic and atraumatic.  Right Ear: External ear and ear canal normal. Tympanic membrane is erythematous.  Left Ear: External ear and ear canal normal. Tympanic membrane is erythematous.  Nose: Mucosal edema and rhinorrhea present.  Mouth/Throat: Uvula is midline and mucous membranes are normal. Posterior oropharyngeal erythema present. No oropharyngeal exudate or posterior oropharyngeal edema.  Eyes: Conjunctivae are normal. Right eye exhibits no discharge. Left eye exhibits no discharge. No scleral icterus.  Neck: Normal range of motion. Neck supple.  Cardiovascular: Normal rate, regular rhythm, normal heart sounds and intact distal pulses.  Pulmonary/Chest: Effort normal. No accessory muscle usage. No tachypnea. No respiratory distress. She has decreased breath sounds. She has wheezes (expiratory throughout post and anterior lungs ). She has no rhonchi. She has rales (Lt>Rt inspiratory) in  the right lower field and the left lower field.  Lymphadenopathy:    She has no cervical adenopathy.  Neurological: She is oriented to person, place, and time. She appears lethargic.  Skin: Skin is warm and dry. She is not diaphoretic. No erythema.  Psychiatric: She has a normal mood and affect. Her behavior is normal.    [place] [facility] Office Visit on 01/25/2018  Component Date Value Ref Range Status  . Glucose 01/25/2018 78  65 - 99 mg/dL Final  . BUN 01/25/2018 15  6 - 24 mg/dL Final  . Creatinine, Ser 01/25/2018 0.97  0.57 - 1.00 mg/dL Final  . GFR calc non Af Amer 01/25/2018 69  >59 mL/min/1.73 Final  . GFR calc Af Amer 01/25/2018 80  >59 mL/min/1.73 Final  . BUN/Creatinine Ratio 01/25/2018 15  9 - 23 Final  . Sodium 01/25/2018 137  134 - 144 mmol/L Final  . Potassium 01/25/2018 4.5  3.5 - 5.2 mmol/L Final  . Chloride 01/25/2018 103  96 - 106 mmol/L Final  . CO2 01/25/2018 21  20 - 29 mmol/L Final  . Calcium 01/25/2018 8.6* 8.7 - 10.2 mg/dL Final  . Total Protein 01/25/2018 7.1  6.0 - 8.5 g/dL Final  . Albumin 01/25/2018 4.0  3.5 - 5.5 g/dL Final  .  Globulin, Total 01/25/2018 3.1  1.5 - 4.5 g/dL Final  . Albumin/Globulin Ratio 01/25/2018 1.3  1.2 - 2.2 Final  . Bilirubin Total 01/25/2018 <0.2  0.0 - 1.2 mg/dL Final  . Alkaline Phosphatase 01/25/2018 106  39 - 117 IU/L Final  . AST 01/25/2018 39  0 - 40 IU/L Final  . ALT 01/25/2018 58* 0 - 32 IU/L Final  . WBC 01/25/2018 7.2  4.6 - 10.2 K/uL Final  . Lymph, poc 01/25/2018 1.8  0.6 - 3.4 Final  . POC LYMPH PERCENT 01/25/2018 25.5  10 - 50 %L Final  . MID (cbc) 01/25/2018 0.7  0 - 0.9 Final  . POC MID % 01/25/2018 10.2  0 - 12 %M Final  . POC Granulocyte 01/25/2018 4.6  2 - 6.9 Final  . Granulocyte percent 01/25/2018 64.3  37 - 80 %G Final  . RBC 01/25/2018 4.02* 4.04 - 5.48 M/uL Final  . Hemoglobin 01/25/2018 12.5  9.5 - 13.5 g/dL Final  . HCT, POC 01/25/2018 38.1  29 - 41 % Final  . MCV 01/25/2018 94.8  76 - 111 fL Final  .  MCH, POC 01/25/2018 31.2  27 - 31.2 pg Final  . MCHC 01/25/2018 32.9  31.8 - 35.4 g/dL Final  . RDW, POC 01/25/2018 13.4  % Final  . Platelet Count, POC 01/25/2018 266  142 - 424 K/uL Final  . MPV 01/25/2018 7.5  0 - 99.8 fL Final  . Skin KOH, POC 01/25/2018 Negative  Negative Final    Dg Chest 2 View  Result Date: 01/25/2018 CLINICAL DATA:  Cough for several days with secondary worsening. EXAM: CHEST - 2 VIEW COMPARISON:  12/31/2017. FINDINGS: Normal heart size.  Clear lung fields.  No bony abnormality. IMPRESSION: No active cardiopulmonary disease.  Stable appearance from priors. Electronically Signed   By: Staci Righter M.D.   On: 01/25/2018 16:30   Assessment & Plan:   1. Transaminitis   2. Encounter for long-term current use of high risk medication   3. Cough   4. Thrush of mouth and esophagus ([place])   5. Acute bronchitis, unspecified organism - secondary worsening with lots of ER associated health care exposures with sxs x >1 mo so will trx w zpack. SIG improvement in exam after alb neb in office so use albuterol inhaler sev x/d.  6. Constipation, chronic   7. Chronic fatigue   8. Abdominal pain, chronic, epigastric   9. Chronic pain syndrome   10. Reactive depression   11. High risk medications (not anticoagulants) long-term use   12. Intractable migraine without aura and without status migrainosus   13. Memory loss due to medical condition    Patient will continue on current chronic medications other than changes noted above, so ok to refill when needed.   See after visit summary for patient specific instructions.  Orders Placed This Encounter  Procedures  . DG Chest 2 View    Standing Status:   Future    Number of Occurrences:   1    Standing Expiration Date:   01/25/2019    Order Specific Question:   Reason for Exam (SYMPTOM  OR DIAGNOSIS REQUIRED)    Answer:   cough x sev d w/ secondary worsening, Bibasilar insp rales Lt>Rt and exp wheeze throughout    Order Specific  Question:   Is the patient pregnant?    Answer:   No    Order Specific Question:   Preferred imaging location?    Answer:  External  . Comprehensive metabolic panel  . POCT CBC  . POCT Skin KOH    Meds ordered this encounter  Medications  . albuterol (PROVENTIL) (2.5 MG/3ML) 0.083% nebulizer solution 2.5 mg  . nystatin (MYCOSTATIN) 100000 UNIT/ML suspension    Sig: 5 ml po qid. Swish in mouth and gargle as long as possible then swallowg. Stop 48hrs AFTER symptoms gone    Dispense:  280 mL    Refill:  0  . amphetamine-dextroamphetamine (ADDERALL) 15 MG tablet    Sig: Take 1 tablet by mouth 2 (two) times daily with a meal.    Dispense:  60 tablet    Refill:  0  . desvenlafaxine (PRISTIQ) 100 MG 24 hr tablet    Sig: Take 1 tablet (100 mg total) by mouth daily.    Dispense:  90 tablet    Refill:  1  . eletriptan (RELPAX) 40 MG tablet    Sig: Take 1 tablet (40 mg total) by mouth every 2 (two) hours as needed for migraine or headache. Do not use >2 doses/24 hours    Dispense:  10 tablet    Refill:  1  . oxyCODONE (OXY IR/ROXICODONE) 5 MG immediate release tablet    Sig: Take 1-2 tablets (5-10 mg total) by mouth every 6 (six) hours as needed for severe pain.    Dispense:  120 tablet    Refill:  0  . oxyCODONE (OXY IR/ROXICODONE) 5 MG immediate release tablet    Sig: Take 1-2 tablets (5-10 mg total) by mouth every 6 (six) hours as needed for severe pain.    Dispense:  120 tablet    Refill:  0  . oxyCODONE (OXY IR/ROXICODONE) 5 MG immediate release tablet    Sig: Take 1-2 tablets (5-10 mg total) by mouth every 6 (six) hours as needed for severe pain.    Dispense:  120 tablet    Refill:  0  . amphetamine-dextroamphetamine (ADDERALL) 15 MG tablet    Sig: Take 1 tablet by mouth 2 (two) times daily with a meal.    Dispense:  60 tablet    Refill:  0  . amphetamine-dextroamphetamine (ADDERALL) 15 MG tablet    Sig: Take 1 tablet by mouth 2 (two) times daily with a meal.    Dispense:   60 tablet    Refill:  0  . albuterol (PROVENTIL HFA;VENTOLIN HFA) 108 (90 Base) MCG/ACT inhaler    Sig: Inhale 2 puffs into the lungs every 6 (six) hours as needed for wheezing or shortness of breath.    Dispense:  1 Inhaler    Refill:  0  . azithromycin (ZITHROMAX) 250 MG tablet    Sig: Take 2 tabs PO x 1 dose, then 1 tab PO QD x 4 days    Dispense:  6 tablet    Refill:  0  . benzonatate (TESSALON) 200 MG capsule    Sig: Take 1 capsule (200 mg total) by mouth 3 (three) times daily as needed for cough.    Dispense:  40 capsule    Refill:  0    Patient verbalized to me that they understand the following: diagnosis, what is being done for them, what to expect and what should be done at home.  Their questions have been answered. They understand that I am unable to predict every possible medication interaction or adverse outcome and that if any unexpected symptoms arise, they should contact us and their pharmacist, as well as never hesitate to seek urgent/emergent  care at [facility] or ER if they think it might be warranted.    Today I have utilized the [place] Controlled Substance Registry's online query to confirm compliance regarding the patient's controlled medications. My review reveals appropriate prescription fills and that I am the sole provider of these medications. Rechecks will occur regularly and the patient is aware of our use of the system.  Over 40 min spent in face-to-face evaluation of and consultation with patient and coordination of care.  Over 50% of this time was spent counseling this patient regarding multiple concerns above.  [name], MD, MPH [facility] [address] [place], [place]  [address] [phone] Office phone  [phone] Office fax  01/25/18 3:39 PM

## 2018-01-25 NOTE — Patient Instructions (Addendum)
If you have lab work done today you will be contacted with your lab results within the next 2 weeks.  If you have not heard from [name] then please contact us. The fastest way to get your results is to register for My Chart.   IF you received an x-ray today, you will receive an invoice from [facility]. Please contact [facility] at [phone] with questions or concerns regarding your invoice.   IF you received labwork today, you will receive an invoice from [facility]. Please contact [facility] at [phone] with questions or concerns regarding your invoice.   Our billing staff will not be able to assist you with questions regarding bills from these companies.  You will be contacted with the lab results as soon as they are available. The fastest way to get your results is to activate your My Chart account. Instructions are located on the last page of this paperwork. If you have not heard from [name] regarding the results in 2 weeks, please contact this office.      Oral Thrush, Adult Oral thrush, also called oral candidiasis, is a fungal infection that develops in the mouth and throat and on the tongue. It causes white patches to form on the mouth and tongue. Ritta Slot is most common in older adults, but it can occur at any age. Many cases of thrush are mild, but this infection can also be serious. Ritta Slot can be a repeated (recurrent) problem for certain people who have a weak body defense system (immune system). The weakness can be caused by chronic illnesses, or by taking medicines that limit the body's ability to fight infection. If a person has difficulty fighting infection, the fungus that causes thrush can spread through the body. This can cause life-threatening blood or organ infections. What are the causes? This condition is caused by a fungus (yeast) called Candida albicans.  This fungus is normally present in small amounts in the mouth and on other mucous membranes. It  usually causes no harm.  If conditions are present that allow the fungus to grow without control, it invades surrounding tissues and becomes an infection.  Other Candida species can also lead to thrush (rare).  What increases the risk? This condition is more likely to develop in:  People with a weakened immune system.  Older adults.  People with HIV (human immunodeficiency virus).  People with diabetes.  People with dry mouth (xerostomia).  Pregnant women.  People with poor dental care, especially people who have false teeth.  People who use antibiotic medicines.  What are the signs or symptoms? Symptoms of this condition can vary from mild and moderate to severe and persistent. Symptoms may include:  A burning feeling in the mouth and throat. This can occur at the start of a thrush infection.  White patches that stick to the mouth and tongue. The tissue around the patches may be red, raw, and painful. If rubbed (during tooth brushing, for example), the patches and the tissue of the mouth may bleed easily.  A bad taste in the mouth or difficulty tasting foods.  A cottony feeling in the mouth.  Pain during eating and swallowing.  Poor appetite.  Cracking at the corners of the mouth.  How is this diagnosed? This condition is diagnosed based on:  Physical exam. Your health care provider will look in your mouth.  Health history. Your health care provider will ask you questions about your health.  How is this treated? This condition  is treated with medicines called antifungals, which prevent the growth of fungi. These medicines are either applied directly to the affected area (topical) or swallowed (oral). The treatment will depend on the severity of the condition. Mild thrush Mild cases of thrush may clear up with the use of an antifungal mouth rinse or lozenges. Treatment usually lasts about 14 days. Moderate to severe thrush  More severe thrush infections that  have spread to the esophagus are treated with an oral antifungal medicine. A topical antifungal medicine may also be used.  For some severe infections, treatment may need to continue for more than 14 days.  Oral antifungal medicines are rarely used during pregnancy because they may be harmful to the unborn child. If you are pregnant, talk with your health care provider about options for treatment. Persistent or recurrent thrush For cases of thrush that do not go away or keep coming back:  Treatment may be needed twice as long as the symptoms last.  Treatment will include both oral and topical antifungal medicines.  People with a weakened immune system can take an antifungal medicine on a continuous basis to prevent thrush infections.  It is important to treat conditions that make a person more likely to get thrush, such as diabetes or HIV. Follow these instructions at home: Medicines  Take over-the-counter and prescription medicines only as told by your health care provider.  Talk with your health care provider about an over-the-counter medicine called gentian violet, which kills bacteria and fungi. Relieving soreness and discomfort To help reduce the discomfort of thrush:  Drink cold liquids such as water or iced tea.  Try flavored ice treats or frozen juices.  Eat foods that are easy to swallow, such as gelatin, ice cream, or custard.  Try drinking from a straw if the patches in your mouth are painful.  General instructions  Eat plain, unflavored yogurt as directed by your health care provider. Check the label to make sure the yogurt contains live cultures. This yogurt can help healthy bacteria to grow in the mouth and can stop the growth of the fungus that causes thrush.  If you wear dentures, remove the dentures before going to bed, brush them vigorously, and soak them in a cleaning solution as directed by your health care provider.  Rinse your mouth with a warm salt-water  mixture several times a day. To make a salt-water mixture, completely dissolve 1/2-1 tsp of salt in 1 cup of warm water. Contact a health care provider if:  Your symptoms are getting worse or are not improving within 7 days of starting treatment.  You have symptoms of a spreading infection, such as white patches on the skin outside of the mouth. This information is not intended to replace advice given to you by your health care provider. Make sure you discuss any questions you have with your health care provider. Document Released: 11/19/2003 Document Revised: 11/18/2015 Document Reviewed: 11/18/2015 Elsevier Interactive Patient Education  2017 [employer].

## 2018-01-26 ENCOUNTER — Telehealth: Payer: Self-pay | Admitting: Family Medicine

## 2018-01-26 LAB — COMPREHENSIVE METABOLIC PANEL
ALBUMIN: 4 g/dL (ref 3.5–5.5)
ALK PHOS: 106 IU/L (ref 39–117)
ALT: 58 IU/L — ABNORMAL HIGH (ref 0–32)
AST: 39 IU/L (ref 0–40)
Albumin/Globulin Ratio: 1.3 (ref 1.2–2.2)
BUN / CREAT RATIO: 15 (ref 9–23)
BUN: 15 mg/dL (ref 6–24)
CO2: 21 mmol/L (ref 20–29)
Calcium: 8.6 mg/dL — ABNORMAL LOW (ref 8.7–10.2)
Chloride: 103 mmol/L (ref 96–106)
Creatinine, Ser: 0.97 mg/dL (ref 0.57–1.00)
GFR calc Af Amer: 80 mL/min/{1.73_m2} (ref >59)
GFR calc non Af Amer: 69 mL/min/{1.73_m2} (ref >59)
GLUCOSE: 78 mg/dL (ref 65–99)
Globulin, Total: 3.1 g/dL (ref 1.5–4.5)
Potassium: 4.5 mmol/L (ref 3.5–5.2)
Sodium: 137 mmol/L (ref 134–144)
Total Protein: 7.1 g/dL (ref 6.0–8.5)

## 2018-01-26 NOTE — Telephone Encounter (Signed)
Copied from [place] [id number]. Topic: Quick Communication - See Telephone Encounter >> Jan 26, 2018 12:20 PM Berneta Levins wrote: CRM for notification. See Telephone encounter for: 01/26/18.  Pt states that she got a note from Dr. Brigitte Pulse yesterday for being out of work.  However it states she was to return today.  Pt states she has bacterial pneumonia and was told she would need to be out of work for the week.  Pt wants to make sure she can get an updated note regarding this. Pt states that she also forgot to ask if Dr. Brigitte Pulse would redo her food stamp letter since she has been out of work - this is something that pt states that Dr. Brigitte Pulse has done for her in the past and the letter for food stamps will need to have yesterdays date on it. Pt can be reached at [phone]

## 2018-01-30 NOTE — Telephone Encounter (Signed)
Yes, please rewrite out of work letter from last visit x 1 wk per pt request.  Also, sounds like pt wants second letter - I think she is referring to letter written 08/25/17 - please copy and paste this prior letter into new one to reflect date of last visit 01/25/2018.

## 2018-02-07 NOTE — Telephone Encounter (Signed)
Both letters has been printed and places in the front at the [address] for pick up. Pt informed.

## 2018-02-08 ENCOUNTER — Ambulatory Visit: Payer: Self-pay | Admitting: Family Medicine

## 2018-02-11 MED FILL — DESVENLAFAXINE SUC ER 100 M: 100 | 90 days supply | Qty: 90 | Fill #0

## 2018-02-14 ENCOUNTER — Other Ambulatory Visit: Payer: Self-pay | Admitting: Family Medicine

## 2018-02-14 DIAGNOSIS — N6320 Unspecified lump in the left breast, unspecified quadrant: Secondary | ICD-10-CM

## 2018-02-24 ENCOUNTER — Encounter: Payer: Self-pay | Admitting: Family Medicine

## 2018-02-25 ENCOUNTER — Telehealth: Payer: Self-pay | Admitting: Skilled Nursing Facility1

## 2018-02-25 NOTE — Telephone Encounter (Signed)
Pt came in to say Hi.  Pt reports 30 grams of protein (premier protein shake) through her tube and minimal bites 2 times a day from food by mouth. Pt states her insurance does not cover tube feed formula. Pt states she experiences vomiting when attempting to eat by mouth and constipation having a bowel movement at best 1 a week. Pt states she will ask Dr. Redmond Pulling for a referral to a gastroenterologist. Pt reports not being able to tolerate any meat nor seafood. Pt reports very low energy.    Dietitian advised: -Aim for 80 grams of protein -Do not consume anything by mouth for 2 months to limit nausea/vomiting -Try now protein Soy isolate -Only use vegetarian/vegan protein supplements  -Call dietitian for an update once she has tried the soy isolate

## 2018-03-04 MED FILL — CYCLOBENZAPRINE HCL 5 MG TA: 5 | 8 days supply | Qty: 40 | Fill #1

## 2018-03-04 MED FILL — clonazePAM 0.5 MG TABS: 0.5 | 30 days supply | Qty: 90 | Fill #1

## 2018-03-04 MED FILL — AMPHETAMINE-DEXTROAMPHETAMI: 15 | 30 days supply | Qty: 60 | Fill #0

## 2018-03-11 MED FILL — clonazePAM 0.5 MG TABS: 0.5 | 30 days supply | Qty: 90 | Fill #1

## 2018-03-11 MED FILL — ELETRIPTAN HYDROBROMIDE 40: 40 | 25 days supply | Qty: 10 | Fill #0

## 2018-03-11 MED FILL — AMPHETAMINE-DEXTROAMPHETAMI: 15 | 30 days supply | Qty: 60 | Fill #0

## 2018-03-11 MED FILL — CYCLOBENZAPRINE HCL 5 MG TA: 5 | 8 days supply | Qty: 40 | Fill #1

## 2018-03-14 ENCOUNTER — Telehealth: Payer: Self-pay | Admitting: Emergency Medicine

## 2018-03-14 ENCOUNTER — Other Ambulatory Visit: Payer: Self-pay

## 2018-03-14 ENCOUNTER — Encounter: Payer: Self-pay | Admitting: Family Medicine

## 2018-03-14 ENCOUNTER — Ambulatory Visit (INDEPENDENT_AMBULATORY_CARE_PROVIDER_SITE_OTHER): Payer: BLUE CROSS/BLUE SHIELD | Admitting: Family Medicine

## 2018-03-14 VITALS — BP 124/83 | HR 83 | Temp 98.9°F | Resp 14 | Ht 68.0 in | Wt 188.0 lb

## 2018-03-14 DIAGNOSIS — Z23 Encounter for immunization: Secondary | ICD-10-CM | POA: Diagnosis not present

## 2018-03-14 DIAGNOSIS — K140 Glossitis: Secondary | ICD-10-CM | POA: Diagnosis not present

## 2018-03-14 DIAGNOSIS — K1379 Other lesions of oral mucosa: Secondary | ICD-10-CM

## 2018-03-14 DIAGNOSIS — J029 Acute pharyngitis, unspecified: Secondary | ICD-10-CM | POA: Diagnosis not present

## 2018-03-14 DIAGNOSIS — Z299 Encounter for prophylactic measures, unspecified: Secondary | ICD-10-CM

## 2018-03-14 LAB — POCT RAPID STREP A (OFFICE): Rapid Strep A Screen: NEGATIVE

## 2018-03-14 LAB — POCT SKIN KOH: SKIN KOH, POC: NEGATIVE

## 2018-03-14 MED ORDER — MAGIC MOUTHWASH W/LIDOCAINE: 5.0000 mL | Freq: Four times a day (QID) | ORAL | 0 refills | Status: DC | PRN

## 2018-03-14 MED FILL — MAGIC MOUTHWASH W/LIDO 1:1: 6 days supply | Qty: 120 | Fill #0

## 2018-03-14 MED FILL — XULANE PATCH: 150-35 | 21 days supply | Qty: 3 | Fill #5

## 2018-03-14 NOTE — Progress Notes (Signed)
Subjective:    Patient ID: Margaret Fletcher, female    DOB: [birth year], 48 y.o.   MRN: [id number]  HPI TRACYE Fletcher is a 48 y.o. female Presents today for: Chief Complaint  Patient presents with  . Mouth Lesions    and sorethroat had a hx of thrush. been taking nystatin help some but not better, tongue feel swollen  . Medication Refill    need refill on elavil, relpax, lactulose, ondansetron, topiramate   presents today for same day appointment for mouth sores. Chronic med refills to be decided/forwarded to primary care provider.   Treated for thrush in January 2019 with mycelex troche - does not recall.  Nystatin swish/swallow was ordered in November for thrush. KOH was negative.  Has used nystatin about 4 times per day, and brushing/flossing ore frequently.  Gums and tongue feel irritated. Possible tongue swelling. Foods irritating, but liquids and coffee feel better.   White patches on throat, red splotches on roof of mouth.  Poked bump on tongue with a pin earlier last year, then rash on chin after blood from tongue - July of 2019.  No chronic antibiotics, but was started in [place] 01/25/18 for possible PNA Irritation on tongue and in mouth past 6 months.  Nystatin cream cleared up outside of mouth to chin.    Patient Active Problem List   Diagnosis Date Noted  . Reactive depression 06/13/2017  . Chronic pain syndrome 06/13/2017  . High risk medications (not anticoagulants) long-term use 06/13/2017  . Empty sella syndrome ([place]) 05/01/2017  . Hypoglycemia after GI (gastrointestinal) surgery 05/01/2017  . Adjustment disorder with mixed anxiety and depressed mood 10/13/2016  . 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 Hypokalemia 02/22/2016  . Constipation, chronic 02/22/2016  . Memory loss due to medical condition 12/14/2015  . Chronic fatigue 12/14/2015  . Recurrent dehydration 09/30/2015  . G tube feedings ([place]) 09/13/2015  . Chronic nausea 08/23/2015  . Abdominal  pain, chronic, epigastric 08/20/2015  . Protein-calorie malnutrition, moderate ([place]) 08/19/2015  . Nausea 08/08/2015  . Mild obstructive sleep apnea 07/08/2015  . Dyslipidemia 07/08/2015  . S/P gastric bypass 07/08/2015  . IIH (idiopathic intracranial hypertension) 06/03/2015  . Worsening headaches 04/30/2015  . Vision changes 04/30/2015  . Perceived hearing changes 04/30/2015  . Chronic daily headache 04/21/2014  . Intractable migraine without aura and without status migrainosus 04/29/2013  . Liver lesion 03/21/2013  . Splenic cyst 03/21/2013  . Abdominal pain 03/21/2013  . Vulvitis 04/22/2011   Past Medical History:  Diagnosis Date  . Alopecia   . Anxiety   . Autoimmune disease ([place])    "Alopecia"  . Bacterial vaginosis   . Cyst of spleen   . Depression   . Frequent UTI   . Hypertrophy of breast   . IIH (idiopathic intracranial hypertension) 2017   "related to headaches"  . Migraine headache    denies, ruled out - Propranolol.Using for headaches   Past Surgical History:  Procedure Laterality Date  . APPENDECTOMY    . BREAST EXCISIONAL BIOPSY Right 2017   removed at time of reduction  . BREAST REDUCTION SURGERY Bilateral 03/26/2015   Procedure: BILATERAL BREAST REDUCTION  ;  Surgeon: Youlanda Roys, MD;  Location: [facility];  Service: Plastics;  Laterality: Bilateral;  . BREATH TEK H PYLORI N/A 05/21/2014   Procedure: BREATH TEK H PYLORI;  Surgeon: Greer Pickerel, MD;  Location: [facility];  Service: General;  Laterality: N/A;  . CESAREAN SECTION    .  CESAREAN SECTION    . CHOLECYSTECTOMY    . DILITATION & CURRETTAGE/HYSTROSCOPY WITH NOVASURE ABLATION N/A 07/19/2012   Procedure: HYSTEROSCOPY WITH NOVASURE ABLATION;  Surgeon: Farrel Gobble. Harrington Challenger, MD;  Location: [facility];  Service: Gynecology;  Laterality: N/A;  . ENDOMETRIAL ABLATION    . ESOPHAGOGASTRODUODENOSCOPY N/A 08/16/2015   Procedure: UPPER ESOPHAGOGASTRODUODENOSCOPY (EGD);  Surgeon: Greer Pickerel, MD;   Location: [facility];  Service: General;  Laterality: N/A;  . ESOPHAGOGASTRODUODENOSCOPY N/A 02/21/2016   Procedure: ESOPHAGOGASTRODUODENOSCOPY (EGD);  Surgeon: Greer Pickerel, MD;  Location: [facility];  Service: General;  Laterality: N/A;  . ESOPHAGOGASTRODUODENOSCOPY N/A 02/24/2017   Procedure: ESOPHAGOGASTRODUODENOSCOPY (EGD);  Surgeon: Greer Pickerel, MD;  Location: [facility];  Service: General;  Laterality: N/A;  . IR GENERIC HISTORICAL  10/04/2015   IR [place] Vivianne Master 10/04/2015 Markus Daft, MD [facility]  . IR GENERIC HISTORICAL  01/31/2016   IR [place] GASTRO/COLONIC TUBE PERCUT W/FLUORO 01/31/2016 Corrie Mckusick, DO [facility]  . IR GENERIC HISTORICAL  04/06/2016   IR [place] TUBE PERCUT W/FLUORO 04/06/2016 Arne Cleveland, MD [facility]  . IR GENERIC HISTORICAL  05/14/2016   IR GASTRIC TUBE PERC CHG W/O IMG GUIDE 05/14/2016 Sandi Mariscal, MD [facility]  . LAPAROSCOPIC GASTROSTOMY N/A 09/30/2015   Procedure: LAPAROSCOPIC GASTROSTOMY TUBE PLACEMENT;  Surgeon: Greer Pickerel, MD;  Location: [facility];  Service: General;  Laterality: N/A;  . LAPAROSCOPIC ROUX-EN-Y GASTRIC BYPASS WITH HIATAL HERNIA REPAIR N/A 07/08/2015   Procedure: LAPAROSCOPIC ROUX-EN-Y GASTRIC BYPASS WITH HIATAL HERNIA REPAIR WITH UPPER ENDOSCOPY;  Surgeon: Greer Pickerel, MD;  Location: [facility];  Service: General;  Laterality: N/A;  . LAPAROSCOPIC SMALL BOWEL RESECTION N/A 09/30/2015   Procedure: LAPAROSCOPIC REVISION OF ROUX LIMB;  Surgeon: Greer Pickerel, MD;  Location: [facility];  Service: General;  Laterality: N/A;  . LAPAROSCOPY N/A 09/30/2015   Procedure: LAPAROSCOPY DIAGNOSTIC;  Surgeon: Greer Pickerel, MD;  Location: [facility];  Service: General;  Laterality: N/A;  . REDUCTION MAMMAPLASTY Bilateral 2017  . TUBAL LIGATION    . WISDOM TOOTH EXTRACTION     Allergies  Allergen Reactions  . Sulfa Antibiotics Anaphylaxis  . Metoclopramide Other (See Comments)    Elevated prolactin >5x ULN induction  lactation when taking scheduled for several months  . Zosyn [Piperacillin Sod-Tazobactam So] Rash    Has patient had a PCN reaction causing immediate rash, facial/tongue/throat swelling, SOB or lightheadedness with hypotension: No Has patient had a PCN reaction causing severe rash involving mucus membranes or skin necrosis: No Has patient had a PCN reaction that required hospitalization: Unknown--inpatient when reaction occurred Has patient had a PCN reaction occurring within the last 10 years: Yes If all of the above answers are "NO", then may proceed with Cephalosporin use.    Prior to Admission medications   Medication Sig Start Date End Date Taking? Authorizing Provider  albuterol (PROVENTIL HFA;VENTOLIN HFA) 108 (90 Base) MCG/ACT inhaler Inhale 2 puffs into the lungs every 6 (six) hours as needed for wheezing or shortness of breath. 01/25/18  Yes Shawnee Knapp, MD  amitriptyline (ELAVIL) 10 MG tablet Take 2 tablets (20 mg total) by mouth at bedtime. 09/14/16  Yes Melvenia Beam, MD  amphetamine-dextroamphetamine (ADDERALL) 15 MG tablet Take 1 tablet by mouth 2 (two) times daily with a meal. 01/25/18  Yes Shawnee Knapp, MD  amphetamine-dextroamphetamine (ADDERALL) 15 MG tablet Take 1 tablet by mouth 2 (two) times daily with a meal. 02/24/18  Yes Shawnee Knapp, MD  amphetamine-dextroamphetamine (ADDERALL)  15 MG tablet Take 1 tablet by mouth 2 (two) times daily with a meal. 03/27/18  Yes Shawnee Knapp, MD  azithromycin (ZITHROMAX) 250 MG tablet Take 2 tabs PO x 1 dose, then 1 tab PO QD x 4 days 01/25/18  Yes Shawnee Knapp, MD  BAYER MICROLET LANCETS lancets Use as instructed 06/13/17  Yes Shawnee Knapp, MD  CALCIUM-VITAMIN D PO Take 1 tablet by mouth 2 (two) times daily. chewable   Yes [provider]  clonazePAM (KLONOPIN) 0.5 MG tablet TAKE 1 TABLET BY MOUTH 3 TIMES DAILY AS NEEDED FOR ANXIETY Patient taking differently: Take 0.5 mg by mouth 3 (three) times daily as needed for anxiety.  11/01/17   Yes Shawnee Knapp, MD  Cyanocobalamin (VITAMIN B-12 SL) Place 1 drop under the tongue daily. 1 dropperful   Yes [provider]  cyclobenzaprine (FLEXERIL) 5 MG tablet Take 1 tablet (5 mg total) by mouth 3 (three) times daily as needed for muscle spasms. May take 2 tabs po qhs 06/10/17  Yes Shawnee Knapp, MD  desvenlafaxine (PRISTIQ) 100 MG 24 hr tablet Take 1 tablet (100 mg total) by mouth daily. 01/25/18  Yes Shawnee Knapp, MD  docusate sodium (COLACE) 100 MG capsule Take 100 mg by mouth daily.   Yes [provider]  eletriptan (RELPAX) 40 MG tablet Take 1 tablet (40 mg total) by mouth every 2 (two) hours as needed for migraine or headache. Do not use >2 doses/24 hours 01/25/18  Yes Shawnee Knapp, MD  glucose blood (CONTOUR NEXT TEST) test strip 1 each by Other route 4 (four) times daily as needed (hypoglycemia). K91.2 06/10/17  Yes Shawnee Knapp, MD  lactulose (CHRONULAC) 10 GM/15ML solution Take 30 g by mouth daily as needed for moderate constipation.  01/14/16  Yes [provider]  Multiple Vitamins-Minerals (BARIATRIC MULTIVITAMINS/IRON PO) Take 1 tablet by mouth 3 (three) times daily.   Yes [provider]  Nutritional Supplements (FEEDING SUPPLEMENT, VITAL HIGH PROTEIN,) LIQD liquid Place 1,000 mLs into feeding tube daily. 60ml/hr for 8hrs a day as needed for poor oral intake; 300 ml free water q4hr Patient taking differently: Place 1,000 mLs into feeding tube daily. 60ml/hr for 8hrs a day as needed for poor oral intake; 300 ml free water q4hr 02/24/16  Yes Greer Pickerel, MD  nystatin (MYCOSTATIN) 100000 UNIT/ML suspension 5 ml po qid. Swish in mouth and gargle as long as possible then swallowg. Stop 48hrs AFTER symptoms gone 01/25/18  Yes Shawnee Knapp, MD  ondansetron (ZOFRAN ODT) 4 MG disintegrating tablet Take 1 tablet (4 mg total) by mouth every 8 (eight) hours as needed for nausea or vomiting. 12/09/17  Yes Rancour, Annie Main, MD  oxyCODONE (OXY IR/ROXICODONE) 5 MG immediate  release tablet Take 1-2 tablets (5-10 mg total) by mouth every 6 (six) hours as needed for severe pain. 01/25/18  Yes Shawnee Knapp, MD  oxyCODONE (OXY IR/ROXICODONE) 5 MG immediate release tablet Take 1-2 tablets (5-10 mg total) by mouth every 6 (six) hours as needed for severe pain. 02/24/18  Yes Shawnee Knapp, MD  oxyCODONE (OXY IR/ROXICODONE) 5 MG immediate release tablet Take 1-2 tablets (5-10 mg total) by mouth every 6 (six) hours as needed for severe pain. 03/27/18  Yes Shawnee Knapp, MD  polyethylene glycol [facility] Floria Raveling) packet Take 17 g by mouth daily as needed (for constipation.).    Yes [provider]  simethicone (MYLICON) 80 MG chewable tablet Chew 80  mg by mouth every 6 (six) hours as needed for flatulence.   Yes [provider]  topiramate (TOPAMAX) 50 MG tablet Take 1.5 tablets (75 mg total) by mouth daily. 02/22/17  Yes Ward Givens, NP   Social History   Socioeconomic History  . Marital status: Divorced    Spouse name: Not on file  . Number of children: 2  . Years of education: 40  . Highest education level: Not on file  Occupational History  . 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 Financial resource strain: Not on file  . Food insecurity:    Worry: Not on file    Inability: Not on file  . Transportation needs:    Medical: Not on file    Non-medical: Not on file  Tobacco Use  . Smoking status: Never Smoker  . Smokeless tobacco: Never Used  Substance and Sexual Activity  . Alcohol use: Yes    Comment: socially  . Drug use: No  . Sexual activity: Yes    Birth control/protection: Surgical  Lifestyle  . Physical activity:    Days per week: Not on file    Minutes per session: Not on file  . Stress: Not on file  Relationships  . Social connections:    Talks on phone: Not on file    Gets together: Not on file    Attends religious service: Not on file    Active member of club or organization: Not on file    Attends meetings  of clubs or organizations: Not on file    Relationship status: Not on file  . Intimate partner violence:    Fear of current or ex partner: Not on file    Emotionally abused: Not on file    Physically abused: Not on file    Forced sexual activity: Not on file  Other Topics Concern  . Not on file  Social History Narrative   Lives with partner   Caffeine use: minimal coffee   Right-handed    Review of Systems     Objective:   Physical Exam Vitals signs and nursing note reviewed.  Constitutional:      General: She is not in acute distress.    Appearance: She is well-developed.  HENT:     Head: Normocephalic and atraumatic.     Right Ear: Hearing, tympanic membrane, ear canal and external ear normal.     Left Ear: Hearing, tympanic membrane, ear canal and external ear normal.     Nose: Nose normal.     Mouth/Throat:     Mouth: Mucous membranes are moist.     Pharynx: Posterior oropharyngeal erythema (min erythamtous patch on R tonsil, no vesicles or exudate.) present. No oropharyngeal exudate.     Comments: Tongue without any apparent focal swelling.  Speaking normally, clearing secretions normally.  Bucca mucosa without apparent lesions.  Very small amount of white coating to the posterior aspect of the tongue. Eyes:     Conjunctiva/sclera: Conjunctivae normal.     Pupils: Pupils are equal, round, and reactive to light.  Cardiovascular:     Rate and Rhythm: Normal rate and regular rhythm.     Heart sounds: Normal heart sounds. No murmur.  Pulmonary:     Effort: Pulmonary effort is normal. No respiratory distress.     Breath sounds: Normal breath sounds. No wheezing or rhonchi.  Skin:    General: Skin is warm and dry.     Findings: No rash.  Neurological:     Mental Status: She is alert and oriented to person, place, and time.  Psychiatric:        Behavior: Behavior normal.    Vitals:   03/14/18 1018  BP: 124/83  Pulse: 83  Resp: 14  Temp: 98.9 F (37.2 C)    TempSrc: Oral  SpO2: 100%  Weight: 188 lb (85.3 kg)  Height: 5\' 8"  (1.727 m)   Results for orders placed or performed in visit on 03/14/18  Vitamin B12  Result Value Ref Range   Vitamin B-12 1,551 (H) 232 - 1,245 pg/mL  POCT Skin KOH  Result Value Ref Range   Skin KOH, POC Negative Negative  POCT rapid strep A  Result Value Ref Range   Rapid Strep A Screen Negative Negative      Assessment & Plan:   Margaret Fletcher is a 48 y.o. female Glossitis - Plan: Vitamin B12, POCT Skin KOH, magic mouthwash w/lidocaine SOLN, Ambulatory referral to ENT Sore throat - Plan: POCT rapid strep A, Culture, Group A Strep, magic mouthwash w/lidocaine SOLN, Ambulatory referral to ENT Mouth pain - Plan: magic mouthwash w/lidocaine SOLN, Ambulatory referral to ENT  -Recurrent mouth pain, sore throat, tongue pain.  Has been treated for possible thrush with nystatin without significant improvement.  B12 was normal.  No focal lesions seen within the tongue or bucca mucosa to indicate thrush and testing again in office was normal. Mucosa appeared moist - less likely sicca syndrome.  Small area of irritation on the right tonsil, strep testing normal, throat culture pending.  -Refer to ENT for further evaluation and can evaluate throat symptoms further.  -Trial of Magic mouthwash  -DDx includes allergic contact stomatitis, or possible burning mouth syndrome. Already on benzodiazepine, but can follow-up with her primary care provider to discuss other work-up/med options  Need for prophylactic measure - Plan: Flu Vaccine QUAD 6+ mos PF IM (Fluarix Quad PF)   Meds ordered this encounter  Medications  . magic mouthwash w/lidocaine SOLN    Sig: Take 5 mLs by mouth 4 (four) times daily as needed for mouth pain.    Dispense:  120 mL    Refill:  0    Ok to substitute ingredients per pharmacy usual "magic mouthwash" prep.   Patient Instructions   Try the Magic mouthwash gargle and spit 4 times per day as needed  for mouth soreness.  Bland foods okay for now.  I will refer you to ear nose and throat to evaluate the mouth soreness further as well as to evaluate the area on the right tonsil.  It does not look like strep throat now, but I will check some further testing and let you know if there are any signs of infection.  If persistent mouth soreness/burning without other known cause, your primary care provider can review your current medications and see if there are any changes recommended.  Thank you for coming in today.  Return to the clinic or go to the nearest emergency room if any of your symptoms worsen or new symptoms occur.    If you have lab work done today you will be contacted with your lab results within the next 2 weeks.  If you have not heard from [name] then please contact us. The fastest way to get your results is to register for My Chart.   IF you received an x-ray today, you will receive an invoice from [facility]. Please contact [facility] at [phone] with questions  or concerns regarding your invoice.   IF you received labwork today, you will receive an invoice from [place]. Please contact [facility] at [phone] with questions or concerns regarding your invoice.   Our billing staff will not be able to assist you with questions regarding bills from these companies.  You will be contacted with the lab results as soon as they are available. The fastest way to get your results is to activate your My Chart account. Instructions are located on the last page of this paperwork. If you have not heard from [name] regarding the results in 2 weeks, please contact this office.       Signed,   Merri Ray, MD [facility] [place].  03/15/18 12:37 PM

## 2018-03-14 NOTE — Patient Instructions (Addendum)
Try the Magic mouthwash gargle and spit 4 times per day as needed for mouth soreness.  Bland foods okay for now.  I will refer you to ear nose and throat to evaluate the mouth soreness further as well as to evaluate the area on the right tonsil.  It does not look like strep throat now, but I will check some further testing and let you know if there are any signs of infection.  If persistent mouth soreness/burning without other known cause, your primary care provider can review your current medications and see if there are any changes recommended.  Thank you for coming in today.  Return to the clinic or go to the nearest emergency room if any of your symptoms worsen or new symptoms occur.    If you have lab work done today you will be contacted with your lab results within the next 2 weeks.  If you have not heard from [name] then please contact us. The fastest way to get your results is to register for My Chart.   IF you received an x-ray today, you will receive an invoice from [facility]. Please contact [facility] at [phone] with questions or concerns regarding your invoice.   IF you received labwork today, you will receive an invoice from [place]. Please contact [facility] at [phone] with questions or concerns regarding your invoice.   Our billing staff will not be able to assist you with questions regarding bills from these companies.  You will be contacted with the lab results as soon as they are available. The fastest way to get your results is to activate your My Chart account. Instructions are located on the last page of this paperwork. If you have not heard from [name] regarding the results in 2 weeks, please contact this office.

## 2018-03-15 ENCOUNTER — Encounter: Payer: Self-pay | Admitting: Family Medicine

## 2018-03-15 ENCOUNTER — Other Ambulatory Visit: Payer: Self-pay | Admitting: Neurology

## 2018-03-15 ENCOUNTER — Other Ambulatory Visit: Payer: Self-pay | Admitting: Adult Health

## 2018-03-15 LAB — VITAMIN B12: Vitamin B-12: 1551 pg/mL — ABNORMAL HIGH (ref 232–1245)

## 2018-03-15 MED FILL — TOPIRAMATE 50 MG TABLET: 50 | 30 days supply | Qty: 45 | Fill #0

## 2018-03-15 NOTE — Telephone Encounter (Signed)
Refilled topiramate x 3 months with note to pharmacy, patient must schedule FU for more refills.

## 2018-03-16 LAB — CULTURE, GROUP A STREP

## 2018-03-17 ENCOUNTER — Encounter: Payer: Self-pay | Admitting: Family Medicine

## 2018-03-18 ENCOUNTER — Other Ambulatory Visit: Payer: Self-pay | Admitting: Family Medicine

## 2018-03-18 DIAGNOSIS — A491 Streptococcal infection, unspecified site: Secondary | ICD-10-CM

## 2018-03-18 MED ORDER — CEPHALEXIN 500 MG PO CAPS: 500.0000 mg | ORAL_CAPSULE | Freq: Two times a day (BID) | ORAL | 0 refills | Status: DC

## 2018-03-18 MED ORDER — AZITHROMYCIN 250 MG PO TABS: ORAL_TABLET | ORAL | 0 refills | Status: DC

## 2018-03-18 MED FILL — AZITHROMYCIN 250 MG TABS: 250 | 5 days supply | Qty: 6 | Fill #0

## 2018-03-18 NOTE — Progress Notes (Signed)
See labs. initially Rx keflex for strep on throat cx. , but reviewed allergy and will change to Zpak.

## 2018-03-21 ENCOUNTER — Emergency Department (HOSPITAL_COMMUNITY)
Admission: EM | Admit: 2018-03-21 | Discharge: 2018-03-22 | Disposition: A | Discharge status: Home / Self Care | Payer: BLUE CROSS/BLUE SHIELD | Attending: Emergency Medicine | Admitting: Emergency Medicine

## 2018-03-21 ENCOUNTER — Encounter (HOSPITAL_COMMUNITY): Payer: Self-pay | Admitting: Family Medicine

## 2018-03-21 ENCOUNTER — Telehealth: Payer: Self-pay | Admitting: Family Medicine

## 2018-03-21 ENCOUNTER — Encounter: Payer: Self-pay | Admitting: Family Medicine

## 2018-03-21 DIAGNOSIS — R1013 Epigastric pain: Secondary | ICD-10-CM | POA: Insufficient documentation

## 2018-03-21 DIAGNOSIS — R51 Headache: Secondary | ICD-10-CM | POA: Diagnosis not present

## 2018-03-21 DIAGNOSIS — R519 Headache, unspecified: Secondary | ICD-10-CM

## 2018-03-21 DIAGNOSIS — Z79899 Other long term (current) drug therapy: Secondary | ICD-10-CM | POA: Insufficient documentation

## 2018-03-21 DIAGNOSIS — R748 Abnormal levels of other serum enzymes: Secondary | ICD-10-CM | POA: Insufficient documentation

## 2018-03-21 DIAGNOSIS — E860 Dehydration: Secondary | ICD-10-CM | POA: Diagnosis not present

## 2018-03-21 MED FILL — oxyCODONE HCL 5 MG TABS: 5 | 15 days supply | Qty: 120 | Fill #0

## 2018-03-21 NOTE — Telephone Encounter (Signed)
Please advise 

## 2018-03-21 NOTE — Telephone Encounter (Signed)
I have submitted a PA for this request  Key: [place]

## 2018-03-21 NOTE — Telephone Encounter (Signed)
Copied from [place] [id number]. Topic: Quick Communication - See Telephone Encounter >> Mar 21, 2018 12:40 PM Vernona Rieger wrote: CRM for notification. See Telephone encounter for: 03/21/18.  Patient states she has insurance now as of 03/09/2018, which is [place]. She is trying to get her oxyCODONE (OXY IR/ROXICODONE) 5 MG immediate release tablet refilled and it is requiring a Prior Autho. Please Advise.

## 2018-03-21 NOTE — ED Triage Notes (Signed)
Patient is from home. She is complaining of generalized headache, muscle cramps, and upper abd pain. Headache started earlier today, muscle cramps started around 4am this morning, and abd cramps are intermittently. Denies diarrhea, vomiting, but reports nausea. Reports dizziness and fatigue. Patient was recently diagnosed with strep throat on 01/06 and prescribed a Zpak that was started today.

## 2018-03-22 LAB — I-STAT BETA HCG BLOOD, ED (MC, WL, AP ONLY): I-stat hCG, quantitative: <5 m[IU]/mL (ref <5)

## 2018-03-22 LAB — CBC
HCT: 37.1 % (ref 36.0–46.0)
Hemoglobin: 11.6 g/dL — ABNORMAL LOW (ref 12.0–15.0)
MCH: 30.9 pg (ref 26.0–34.0)
MCHC: 31.3 g/dL (ref 30.0–36.0)
MCV: 98.9 fL (ref 80.0–100.0)
Platelets: 273 10*3/uL (ref 150–400)
RBC: 3.75 MIL/uL — ABNORMAL LOW (ref 3.87–5.11)
RDW: 13.4 % (ref 11.5–15.5)
WBC: 7.8 10*3/uL (ref 4.0–10.5)
nRBC: 0 % (ref 0.0–0.2)

## 2018-03-22 LAB — URINALYSIS, ROUTINE W REFLEX MICROSCOPIC
BILIRUBIN URINE: NEGATIVE
Glucose, UA: NEGATIVE mg/dL
Hgb urine dipstick: NEGATIVE
Ketones, ur: NEGATIVE mg/dL
Leukocytes, UA: NEGATIVE
Nitrite: NEGATIVE
Protein, ur: NEGATIVE mg/dL
Specific Gravity, Urine: 1.014 (ref 1.005–1.030)
pH: 7 (ref 5.0–8.0)

## 2018-03-22 LAB — COMPREHENSIVE METABOLIC PANEL
ALK PHOS: 73 U/L (ref 38–126)
ALT: 34 U/L (ref 0–44)
AST: 39 U/L (ref 15–41)
Albumin: 3.6 g/dL (ref 3.5–5.0)
Anion gap: 7 (ref 5–15)
BUN: 26 mg/dL — ABNORMAL HIGH (ref 6–20)
CALCIUM: 8.3 mg/dL — AB (ref 8.9–10.3)
CO2: 22 mmol/L (ref 22–32)
Chloride: 111 mmol/L (ref 98–111)
Creatinine, Ser: 1.27 mg/dL — ABNORMAL HIGH (ref 0.44–1.00)
GFR calc Af Amer: 58 mL/min — ABNORMAL LOW (ref >60)
GFR calc non Af Amer: 50 mL/min — ABNORMAL LOW (ref >60)
Glucose, Bld: 86 mg/dL (ref 70–99)
Potassium: 4.2 mmol/L (ref 3.5–5.1)
Sodium: 140 mmol/L (ref 135–145)
Total Bilirubin: 0.9 mg/dL (ref 0.3–1.2)
Total Protein: 6.6 g/dL (ref 6.5–8.1)

## 2018-03-22 LAB — CK: Total CK: 372 U/L — ABNORMAL HIGH (ref 38–234)

## 2018-03-22 LAB — LIPASE, BLOOD: Lipase: 38 U/L (ref 11–51)

## 2018-03-22 MED ORDER — ONDANSETRON 4 MG PO TBDP: 4.0000 mg | ORAL_TABLET | Freq: Three times a day (TID) | ORAL | 0 refills | Status: DC | PRN

## 2018-03-22 MED ORDER — HYDROMORPHONE HCL 1 MG/ML IJ SOLN
1.0000 mg | Freq: Once | INTRAMUSCULAR | Status: AC
  Administered 2018-03-22: 1 mg via INTRAVENOUS
  Filled 2018-03-22: qty 1

## 2018-03-22 MED ORDER — FAMOTIDINE IN NACL 20-0.9 MG/50ML-% IV SOLN
20.0000 mg | Freq: Once | INTRAVENOUS | Status: AC
  Administered 2018-03-22: 20 mg via INTRAVENOUS
  Filled 2018-03-22: qty 50

## 2018-03-22 MED ORDER — DIPHENHYDRAMINE HCL 50 MG/ML IJ SOLN
25.0000 mg | Freq: Once | INTRAMUSCULAR | Status: AC
  Administered 2018-03-22: 25 mg via INTRAVENOUS
  Filled 2018-03-22: qty 1

## 2018-03-22 MED ORDER — SODIUM CHLORIDE 0.9 % IV BOLUS: 1000.0000 mL | Freq: Once | INTRAVENOUS | Status: DC

## 2018-03-22 MED ORDER — PROCHLORPERAZINE EDISYLATE 10 MG/2ML IJ SOLN
10.0000 mg | Freq: Once | INTRAMUSCULAR | Status: AC
  Administered 2018-03-22: 10 mg via INTRAVENOUS
  Filled 2018-03-22: qty 2

## 2018-03-22 MED ORDER — SODIUM CHLORIDE 0.9 % IV BOLUS
1000.0000 mL | Freq: Once | INTRAVENOUS | Status: AC
  Administered 2018-03-22: 1000 mL via INTRAVENOUS

## 2018-03-22 NOTE — Discharge Instructions (Addendum)
Thank you for allowing me to care for you today in the Emergency Department.   Continue to follow up with your primary care team.   Let 1 tablet of Zofran dissolve under tongue every 8 hours as needed for nausea or vomiting.  Return to the emergency department if you stop making urine, if your urine turns dark or cola colored, if you develop high fever, persistent vomiting, new numbness or weakness with a severe headache, or other new, concerning symptoms.

## 2018-03-22 NOTE — ED Provider Notes (Signed)
[facility] DEPT Provider Note   CSN: [id number] Arrival date & time: 03/21/18  2258     History   Chief Complaint Chief Complaint  Patient presents with  . Headache  . Abdominal Pain    HPI Margaret Fletcher is a 48 y.o. female with a history of idiopathic intracranial hypertension, alopecia, depression, anxiety, empty sella syndrome, history of gastric bypass, and chronic pain syndrome who presents to the emergency department with a chief complaint of constant, nonradiating, epigastric pain with headache, muscle cramps, nausea, retching, dizziness, lightheadedness, and fatigue.  Also reports bilateral jaw pain and states the pain feels as if she is sucking on a lemon.  No known aggravating or alleviating factors.  She also reports she fell and hit her lower leg during the fall several days ago, but denies hitting her head, nausea, emesis, or LOC.  She reports that typically when she gets like this she is dehydrated and has to come in for fluids and pain medication because her home oxycodone has not helped improve her headache or other symptoms.  The history is provided by the patient. No language interpreter was used.  Abdominal Pain  Associated symptoms: fatigue and nausea   Associated symptoms: no chest pain, no dysuria, no shortness of breath, no sore throat and no vaginal discharge     Past Medical History:  Diagnosis Date  . Alopecia   . Anxiety   . Autoimmune disease ([place])    "Alopecia"  . Bacterial vaginosis   . Cyst of spleen   . Depression   . Frequent UTI   . Hypertrophy of breast   . IIH (idiopathic intracranial hypertension) 2017   "related to headaches"  . Migraine headache    denies, ruled out - Propranolol.Using for headaches    Patient Active Problem List   Diagnosis Date Noted  . Reactive depression 06/13/2017  . Chronic pain syndrome 06/13/2017  . High risk medications (not anticoagulants) long-term use 06/13/2017  .  Empty sella syndrome ([place]) 05/01/2017  . Hypoglycemia after GI (gastrointestinal) surgery 05/01/2017  . Adjustment disorder with mixed anxiety and depressed mood 10/13/2016  . 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 Hypokalemia 02/22/2016  . Constipation, chronic 02/22/2016  . Memory loss due to medical condition 12/14/2015  . Chronic fatigue 12/14/2015  . Recurrent dehydration 09/30/2015  . G tube feedings ([place]) 09/13/2015  . Chronic nausea 08/23/2015  . Abdominal pain, chronic, epigastric 08/20/2015  . Protein-calorie malnutrition, moderate ([place]) 08/19/2015  . Nausea 08/08/2015  . Mild obstructive sleep apnea 07/08/2015  . Dyslipidemia 07/08/2015  . S/P gastric bypass 07/08/2015  . IIH (idiopathic intracranial hypertension) 06/03/2015  . Worsening headaches 04/30/2015  . Vision changes 04/30/2015  . Perceived hearing changes 04/30/2015  . Chronic daily headache 04/21/2014  . Intractable migraine without aura and without status migrainosus 04/29/2013  . Liver lesion 03/21/2013  . Splenic cyst 03/21/2013  . Abdominal pain 03/21/2013  . Vulvitis 04/22/2011    Past Surgical History:  Procedure Laterality Date  . APPENDECTOMY    . BREAST EXCISIONAL BIOPSY Right 2017   removed at time of reduction  . BREAST REDUCTION SURGERY Bilateral 03/26/2015   Procedure: BILATERAL BREAST REDUCTION  ;  Surgeon: Youlanda Roys, MD;  Location: [facility];  Service: Plastics;  Laterality: Bilateral;  . BREATH TEK H PYLORI N/A 05/21/2014   Procedure: BREATH TEK H PYLORI;  Surgeon: Greer Pickerel, MD;  Location: [facility];  Service: General;  Laterality: N/A;  .  CESAREAN SECTION    . CESAREAN SECTION    . CHOLECYSTECTOMY    . DILITATION & CURRETTAGE/HYSTROSCOPY WITH NOVASURE ABLATION N/A 07/19/2012   Procedure: HYSTEROSCOPY WITH NOVASURE ABLATION;  Surgeon: Farrel Gobble. Harrington Challenger, MD;  Location: [facility];  Service: Gynecology;  Laterality: N/A;  . ENDOMETRIAL ABLATION    .  ESOPHAGOGASTRODUODENOSCOPY N/A 08/16/2015   Procedure: UPPER ESOPHAGOGASTRODUODENOSCOPY (EGD);  Surgeon: Greer Pickerel, MD;  Location: [facility];  Service: General;  Laterality: N/A;  . ESOPHAGOGASTRODUODENOSCOPY N/A 02/21/2016   Procedure: ESOPHAGOGASTRODUODENOSCOPY (EGD);  Surgeon: Greer Pickerel, MD;  Location: [facility];  Service: General;  Laterality: N/A;  . ESOPHAGOGASTRODUODENOSCOPY N/A 02/24/2017   Procedure: ESOPHAGOGASTRODUODENOSCOPY (EGD);  Surgeon: Greer Pickerel, MD;  Location: [facility];  Service: General;  Laterality: N/A;  . IR GENERIC HISTORICAL  10/04/2015   IR [place] Vivianne Master 10/04/2015 Markus Daft, MD [facility]  . IR GENERIC HISTORICAL  01/31/2016   IR [place] GASTRO/COLONIC TUBE PERCUT W/FLUORO 01/31/2016 Corrie Mckusick, DO [facility]  . IR GENERIC HISTORICAL  04/06/2016   IR [place] TUBE PERCUT W/FLUORO 04/06/2016 Arne Cleveland, MD [facility]  . IR GENERIC HISTORICAL  05/14/2016   IR GASTRIC TUBE PERC CHG W/O IMG GUIDE 05/14/2016 Sandi Mariscal, MD [facility]  . LAPAROSCOPIC GASTROSTOMY N/A 09/30/2015   Procedure: LAPAROSCOPIC GASTROSTOMY TUBE PLACEMENT;  Surgeon: Greer Pickerel, MD;  Location: [facility];  Service: General;  Laterality: N/A;  . LAPAROSCOPIC ROUX-EN-Y GASTRIC BYPASS WITH HIATAL HERNIA REPAIR N/A 07/08/2015   Procedure: LAPAROSCOPIC ROUX-EN-Y GASTRIC BYPASS WITH HIATAL HERNIA REPAIR WITH UPPER ENDOSCOPY;  Surgeon: Greer Pickerel, MD;  Location: [facility];  Service: General;  Laterality: N/A;  . LAPAROSCOPIC SMALL BOWEL RESECTION N/A 09/30/2015   Procedure: LAPAROSCOPIC REVISION OF ROUX LIMB;  Surgeon: Greer Pickerel, MD;  Location: [facility];  Service: General;  Laterality: N/A;  . LAPAROSCOPY N/A 09/30/2015   Procedure: LAPAROSCOPY DIAGNOSTIC;  Surgeon: Greer Pickerel, MD;  Location: [facility];  Service: General;  Laterality: N/A;  . REDUCTION MAMMAPLASTY Bilateral 2017  . TUBAL LIGATION    . WISDOM TOOTH EXTRACTION       OB History   No  obstetric history on file.      Home Medications    Prior to Admission medications   Medication Sig Start Date End Date Taking? Authorizing Provider  albuterol (PROVENTIL HFA;VENTOLIN HFA) 108 (90 Base) MCG/ACT inhaler Inhale 2 puffs into the lungs every 6 (six) hours as needed for wheezing or shortness of breath. 01/25/18  Yes Shawnee Knapp, MD  amitriptyline (ELAVIL) 10 MG tablet Take 2 tablets (20 mg total) by mouth at bedtime. 09/14/16  Yes Melvenia Beam, MD  amphetamine-dextroamphetamine (ADDERALL) 15 MG tablet Take 1 tablet by mouth 2 (two) times daily with a meal. 02/24/18  Yes Shawnee Knapp, MD  azithromycin (ZITHROMAX) 250 MG tablet Take 2 pills by mouth on day 1, then 1 pill by mouth per day on days 2 through 5. 03/18/18  Yes Wendie Agreste, MD  CALCIUM-VITAMIN D PO Take 1 tablet by mouth 2 (two) times daily. chewable   Yes [provider]  clonazePAM (KLONOPIN) 0.5 MG tablet TAKE 1 TABLET BY MOUTH 3 TIMES DAILY AS NEEDED FOR ANXIETY Patient taking differently: Take 0.5 mg by mouth 3 (three) times daily as needed for anxiety.  11/01/17  Yes Shawnee Knapp, MD  Cyanocobalamin (VITAMIN B-12 SL) Place 1 drop under the tongue daily. 1 dropperful   Yes [provider]  cyclobenzaprine (  FLEXERIL) 5 MG tablet Take 1 tablet (5 mg total) by mouth 3 (three) times daily as needed for muscle spasms. May take 2 tabs po qhs 06/10/17  Yes Shawnee Knapp, MD  desvenlafaxine (PRISTIQ) 100 MG 24 hr tablet Take 1 tablet (100 mg total) by mouth daily. 01/25/18  Yes Shawnee Knapp, MD  docusate sodium (COLACE) 100 MG capsule Take 100 mg by mouth daily.   Yes [provider]  eletriptan (RELPAX) 40 MG tablet Take 1 tablet (40 mg total) by mouth every 2 (two) hours as needed for migraine or headache. Do not use >2 doses/24 hours 01/25/18  Yes Shawnee Knapp, MD  lactulose [facility]) 10 GM/15ML solution Take 30 g by mouth daily as needed for moderate constipation.  01/14/16  Yes [provider]  magic mouthwash w/lidocaine SOLN Take 5 mLs by mouth 4 (four) times daily as needed for mouth pain. 03/14/18  Yes Wendie Agreste, MD  Multiple Vitamins-Minerals (BARIATRIC MULTIVITAMINS/IRON PO) Take 1 tablet by mouth 3 (three) times daily.   Yes [provider]  Nutritional Supplements (FEEDING SUPPLEMENT, VITAL HIGH PROTEIN,) LIQD liquid Place 1,000 mLs into feeding tube daily. 60ml/hr for 8hrs a day as needed for poor oral intake; 300 ml free water q4hr Patient taking differently: Place 1,000 mLs into feeding tube daily. 79ml/hr for 8hrs a day as needed for poor oral intake; 300 ml free water q4hr 02/24/16  Yes Greer Pickerel, MD  nystatin (MYCOSTATIN) 100000 UNIT/ML suspension 5 ml po qid. Swish in mouth and gargle as long as possible then swallowg. Stop 48hrs AFTER symptoms gone 01/25/18  Yes Shawnee Knapp, MD  oxyCODONE (OXY IR/ROXICODONE) 5 MG immediate release tablet Take 1-2 tablets (5-10 mg total) by mouth every 6 (six) hours as needed for severe pain. Patient taking differently: Take 2.5 mg by mouth every 6 (six) hours as needed for severe pain.  01/25/18  Yes Shawnee Knapp, MD  polyethylene glycol [facility] Floria Raveling) packet Take 17 g by mouth daily as needed (for constipation.).    Yes [provider]  simethicone (MYLICON) 80 MG chewable tablet Chew 80 mg by mouth every 6 (six) hours as needed for flatulence.   Yes [provider]  topiramate (TOPAMAX) 50 MG tablet TAKE 1.5 TABLETS (75 MG TOTAL) BY MOUTH DAILY. 03/15/18  Yes Kathrynn Ducking, MD  Vitamin D, Ergocalciferol, (DRISDOL) 1.25 MG (50000 UT) CAPS capsule Take 1 capsule by mouth daily.   Yes [provider]  BAYER MICROLET LANCETS lancets Use as instructed 06/13/17   Shawnee Knapp, MD  glucose blood (CONTOUR NEXT TEST) test strip 1 each by Other route 4 (four) times daily as needed (hypoglycemia). K91.2 06/10/17   Shawnee Knapp, MD  ondansetron (ZOFRAN ODT) 4 MG disintegrating tablet Take 1  tablet (4 mg total) by mouth every 8 (eight) hours as needed for nausea or vomiting. 03/22/18   Hermie Reagor A, PA-C    Family History Family History  Problem Relation Age of Onset  . Cancer Mother        late 21'-50  . Breast cancer Mother        late 36'-50  . Stroke Father   . Diabetes Brother   . Seizures Son   . Breast cancer Maternal Aunt        late 40'-50  . Breast cancer Maternal Aunt        late 40'-50  . Breast cancer Maternal Aunt  late 40'-50  . Migraines Neg Hx     Social History Social History   Tobacco Use  . Smoking status: Never Smoker  . Smokeless tobacco: Never Used  Substance Use Topics  . Alcohol use: Not Currently  . Drug use: No     Allergies   Sulfa antibiotics; Metoclopramide; and Zosyn [piperacillin sod-tazobactam so]   Review of Systems Review of Systems  Constitutional: Positive for fatigue. Negative for activity change.  HENT: Negative for congestion and sore throat.   Eyes: Negative for visual disturbance.  Respiratory: Negative for choking and shortness of breath.   Cardiovascular: Negative for chest pain, palpitations and leg swelling.  Gastrointestinal: Positive for abdominal pain and nausea.  Genitourinary: Negative for dysuria, flank pain, urgency and vaginal discharge.  Musculoskeletal: Positive for myalgias. Negative for back pain, neck pain and neck stiffness.  Skin: Negative for rash and wound.  Allergic/Immunologic: Negative for immunocompromised state.  Neurological: Positive for dizziness, weakness (generalized), light-headedness and headaches. Negative for numbness.  Psychiatric/Behavioral: Negative for confusion.     Physical Exam Updated Vital Signs BP 99/68 (BP Location: Left Arm)   Pulse 65   Temp 98.4 F (36.9 C) (Oral)   Resp 19   Ht 5\' 8"  (1.727 m)   Wt 75.3 kg   SpO2 98%   BMI 25.23 kg/m   Physical Exam Vitals signs and nursing note reviewed.  Constitutional:      General: She is not in  acute distress.    Appearance: She is not ill-appearing, toxic-appearing or diaphoretic.  HENT:     Head: Normocephalic.  Eyes:     Conjunctiva/sclera: Conjunctivae normal.  Neck:     Musculoskeletal: Neck supple.     Meningeal: Kernig's sign absent.  Cardiovascular:     Rate and Rhythm: Normal rate and regular rhythm.     Heart sounds: No murmur. No friction rub. No gallop.   Pulmonary:     Effort: Pulmonary effort is normal. No respiratory distress.     Breath sounds: No stridor. No wheezing, rhonchi or rales.  Abdominal:     General: There is no distension.     Palpations: Abdomen is soft. There is no mass.     Tenderness: There is abdominal tenderness. There is no guarding.     Comments: Moderately tender to palpation in the epigastric region without rebound or guarding.  No CVA tenderness bilaterally.  Negative Murphy sign.  No tenderness over McBurney's point.  Feeding tube is in place with minimal discomfort surrounding the tube.  Musculoskeletal:        General: No swelling or tenderness.  Lymphadenopathy:     Cervical: No cervical adenopathy.  Skin:    General: Skin is warm.     Findings: No rash.  Neurological:     Mental Status: She is alert.     Comments: Alert and oriented x4.  Cranial nerves II through XII are grossly intact.  5-5 strength against resistance of the bilateral upper and lower extremities.  Sensation is intact and equal throughout.  Finger-nose is intact bilaterally.  Psychiatric:        Behavior: Behavior normal.      ED Treatments / Results  Labs (all labs ordered are listed, but only abnormal results are displayed) Labs Reviewed  COMPREHENSIVE METABOLIC PANEL - Abnormal; Notable for the following components:      Result Value   BUN 26 (*)    Creatinine, Ser 1.27 (*)    Calcium 8.3 (*)  GFR calc non Af Amer 50 (*)    GFR calc Af Amer 58 (*)    All other components within normal limits  CBC - Abnormal; Notable for the following  components:   RBC 3.75 (*)    Hemoglobin 11.6 (*)    All other components within normal limits  CK - Abnormal; Notable for the following components:   Total CK 372 (*)    All other components within normal limits  LIPASE, BLOOD  URINALYSIS, ROUTINE W REFLEX MICROSCOPIC  I-STAT BETA HCG BLOOD, ED ([facility], [facility], [facility] ONLY)    EKG None  Radiology No results found.  Procedures Procedures (including critical care time)  Medications Ordered in ED Medications  sodium chloride 0.9 % bolus 1,000 mL (has no administration in time range)  sodium chloride 0.9 % bolus 1,000 mL (0 mLs Intravenous Stopped 03/22/18 0427)  prochlorperazine (COMPAZINE) injection 10 mg (10 mg Intravenous Given 03/22/18 0237)  diphenhydrAMINE (BENADRYL) injection 25 mg (25 mg Intravenous Given 03/22/18 0237)  HYDROmorphone (DILAUDID) injection 1 mg (1 mg Intravenous Given 03/22/18 0236)  famotidine (PEPCID) IVPB 20 mg premix (0 mg Intravenous Stopped 03/22/18 0338)     Initial Impression / Assessment and Plan / ED Course  I have reviewed the triage vital signs and the nursing notes.  Pertinent labs & imaging results that were available during my care of the patient were reviewed by me and considered in my medical decision making (see chart for details).     48 year old female with a history of idiopathic intracranial hypertension, alopecia, depression, anxiety, empty sella syndrome, history of gastric bypass, and chronic pain syndrome presenting with concern for dehydration, headache, muscle cramps, dizziness, lightheadedness, retching, and fatigue.  She reports a history of similar constellations and typically is dehydrated.  Creatinine is slightly elevated at 1.27, up from 0.97.  Given muscle cramps, CT was ordered and was found to be 372.  She appears mildly dehydrated.  This may be secondary to the fall states she is sustained over the last few days.  She is not on any statins.  She is not having any chest pain or  shortness of breath.  She was given an IV fluid bolus with Compazine, Benadryl, Dilaudid, and Pepcid.  After first IV fluid bolus, the patient reports that she was feeling much better.  Her abdominal pain had resolved.  Her migraine had resolved.  She is requesting a second liter of fluids.  After the second liter of fluids, she is feeling much improved.  Doubt CVA, UTI, appendicitis, pancreatitis, diverticulitis.  She is hemodynamically stable and in no acute distress.  She is safe for discharge to home with outpatient follow-up at this time.  Final Clinical Impressions(s) / ED Diagnoses   Final diagnoses:  Dehydration  Bad headache  Epigastric pain  Elevated CK    ED Discharge Orders         Ordered    ondansetron (ZOFRAN ODT) 4 MG disintegrating tablet  Every 8 hours PRN     03/22/18 0620           Joline Maxcy A, PA-C 03/22/18 0910    Molpus, Jenny Reichmann, MD 03/22/18 2241

## 2018-03-23 MED ORDER — AMITRIPTYLINE HCL 10 MG PO TABS: 20.0000 mg | ORAL_TABLET | Freq: Every day | ORAL | 2 refills | Status: DC

## 2018-03-23 MED ORDER — ELETRIPTAN HYDROBROMIDE 40 MG PO TABS: 40.0000 mg | ORAL_TABLET | ORAL | 2 refills | Status: DC | PRN

## 2018-03-23 NOTE — Telephone Encounter (Addendum)
I think she had gotten all of these meds from her specialists prior.  I sent in 3 mos on her amitriptyline and relpax but recommend she see her neurologist within that time to ensure her headaches are being adequately treated and she isn't having increased pressure causing worsening HAs from her intracranial hypertension and that that is being adequately managed as well. It looks like neurology sent her in a 3 mos refill of her topimate last week but also said she needed OV w/ them for further refills.     I assume her surgeon Dr. Redmond Pulling was rxing her lactulose prior?  She could contact him for refills or I am happy to refill but please confirm the dose that she is taking to ensure the sig is correct and the quantity she prefers at once and then I can send in.   Thanks.   Meds ordered this encounter  Medications  . amitriptyline (ELAVIL) 10 MG tablet    Sig: Take 2 tablets (20 mg total) by mouth at bedtime.    Dispense:  60 tablet    Refill:  2  . eletriptan (RELPAX) 40 MG tablet    Sig: Take 1 tablet (40 mg total) by mouth every 2 (two) hours as needed for migraine or headache. Do not use >2 doses/24 hours    Dispense:  10 tablet    Refill:  2     .

## 2018-03-23 NOTE — Telephone Encounter (Signed)
pmp reviewed. rx refilled on 03/21/2018 No further actions taken

## 2018-03-23 NOTE — Telephone Encounter (Signed)
Pt was seen in [place] for this by Dr. Carlota Raspberry on 1/6, trx'd for strep and referred to ENT

## 2018-03-23 NOTE — Telephone Encounter (Signed)
Patient was denied the medication Oxycodone 5 mg tabs by her insurance company due to benefit limitations. Explanation is in the providers box at nurses station

## 2018-03-23 NOTE — Addendum Note (Signed)
Addended by: Shawnee Knapp on: 03/23/2018 03:27 PM   Modules accepted: Orders

## 2018-04-05 ENCOUNTER — Ambulatory Visit: Payer: BLUE CROSS/BLUE SHIELD | Admitting: Family Medicine

## 2018-04-06 MED FILL — XULANE PATCH: 150-35 | 21 days supply | Qty: 3 | Fill #6

## 2018-04-13 ENCOUNTER — Other Ambulatory Visit: Payer: Self-pay | Admitting: General Surgery

## 2018-04-13 DIAGNOSIS — K5909 Other constipation: Secondary | ICD-10-CM

## 2018-04-15 ENCOUNTER — Ambulatory Visit
Admission: RE | Admit: 2018-04-15 | Discharge: 2018-04-15 | Disposition: A | Discharge status: Home / Self Care | Payer: BLUE CROSS/BLUE SHIELD | Source: Ambulatory Visit | Attending: General Surgery | Admitting: General Surgery

## 2018-04-15 DIAGNOSIS — K5909 Other constipation: Secondary | ICD-10-CM

## 2018-04-18 ENCOUNTER — Other Ambulatory Visit: Payer: Self-pay | Admitting: Family Medicine

## 2018-04-22 MED FILL — ONDANSETRON ODT 4 MG TABLET: 4 | 3 days supply | Qty: 15 | Fill #0

## 2018-04-26 ENCOUNTER — Encounter: Payer: Self-pay | Admitting: Family Medicine

## 2018-04-26 ENCOUNTER — Ambulatory Visit: Payer: BLUE CROSS/BLUE SHIELD | Admitting: Family Medicine

## 2018-04-26 VITALS — BP 136/88 | HR 74 | Temp 99.2°F | Resp 17 | Ht 68.0 in | Wt 193.0 lb

## 2018-04-26 DIAGNOSIS — Z9884 Bariatric surgery status: Secondary | ICD-10-CM

## 2018-04-26 DIAGNOSIS — R5382 Chronic fatigue, unspecified: Secondary | ICD-10-CM

## 2018-04-26 DIAGNOSIS — K140 Glossitis: Secondary | ICD-10-CM

## 2018-04-26 DIAGNOSIS — K219 Gastro-esophageal reflux disease without esophagitis: Secondary | ICD-10-CM | POA: Diagnosis not present

## 2018-04-26 DIAGNOSIS — J029 Acute pharyngitis, unspecified: Secondary | ICD-10-CM

## 2018-04-26 MED ORDER — NYSTATIN 100000 UNIT/ML MT SUSP: OROMUCOSAL | 0 refills | Status: DC

## 2018-04-26 MED ORDER — OMEPRAZOLE 20 MG PO CPDR: 20.0000 mg | DELAYED_RELEASE_CAPSULE | Freq: Two times a day (BID) | ORAL | 0 refills | Status: DC

## 2018-04-26 MED FILL — NYSTATIN 100,000 UNITS/ML S: 100000 | 14 days supply | Qty: 280 | Fill #0

## 2018-04-26 MED FILL — OMEPRAZOLE 20 MG CPDR: 20 | 14 days supply | Qty: 28 | Fill #0

## 2018-04-26 NOTE — Patient Instructions (Signed)
° ° ° °  If you have lab work done today you will be contacted with your lab results within the next 2 weeks.  If you have not heard from us then please contact us. The fastest way to get your results is to register for My Chart. ° ° °IF you received an x-ray today, you will receive an invoice from [facility]. Please contact [facility] at [phone] with questions or concerns regarding your invoice.  ° °IF you received labwork today, you will receive an invoice from [facility]. Please contact [facility] at [phone] with questions or concerns regarding your invoice.  ° °Our billing staff will not be able to assist you with questions regarding bills from these companies. ° °You will be contacted with the lab results as soon as they are available. The fastest way to get your results is to activate your My Chart account. Instructions are located on the last page of this paperwork. If you have not heard from us regarding the results in 2 weeks, please contact this office. °  ° ° ° °

## 2018-04-26 NOTE — Progress Notes (Signed)
2/18/20204:20 PM  Margaret Fletcher 03-28-1970, 48 y.o. female [id number]  Chief Complaint  Patient presents with  . Mouth Injury    irritation, bumps on the tongue. feels very fatigue and cold all the time.Dentist recommend tonsils out. Pt is making up her mind wheter she wants to move forward    HPI:   Patient is a 48 y.o. female with past medical history significant for migraine, OSA, constipation, empty sella syndrome, G tube feeding, s/p gastric bypass who presents today for tongue discomfort  She had a little bump on side of her tongue She tried to pop it with a needle which made it worse She saw Dr Brigitte Pulse and was given nystatin She came back again for similar concerns, had non Strep A, treated with azithromyicn Referred to ENT, recommended tonsils to be removed Has seen dentist, thinks it is very irritated from trauma  She continues to have sore throat and feels her right lymph nodes remain swollen, sometimes jaw and ears hurt Continues to have sensitivity in the tip of her tongue Reports feeling feverish, chills, very tired, needing to take several naps  Has recurrent reflux and vomiting due to chronic ongoing GI issues 2/2 bariatric surgery Recent b12 elevated Not currently on any gerd meds due to surgical history egd in 2018 - normal esophagus  Nystatin was more soothing than magic mouth wash  Fall Risk  04/26/2018 04/26/2018 03/14/2018 01/25/2018 08/25/2017  Falls in the past year? 0 0 1 1 No  Number falls in past yr: 0 - 0 0 -  Injury with Fall? 0 - 1 1 -  Comment - - left knee - -  Follow up - - - - -     Depression screen [facility] 2/9 04/26/2018 01/25/2018 08/25/2017  Decreased Interest 0 1 0  Down, Depressed, Hopeless 0 1 0  PHQ - 2 Score 0 2 0  Altered sleeping 0 3 -  Tired, decreased energy 0 1 -  Change in appetite 0 1 -  Feeling bad or failure about yourself  0 1 -  Trouble concentrating 0 0 -  Moving slowly or fidgety/restless 0 0 -  Suicidal thoughts 0 0 -    PHQ-9 Score 0 8 -  Difficult doing work/chores Not difficult at all - -  Some recent data might be hidden    Allergies  Allergen Reactions  . Sulfa Antibiotics Anaphylaxis  . Metoclopramide Other (See Comments)    Elevated prolactin >5x ULN induction lactation when taking scheduled for several months  . Zosyn [Piperacillin Sod-Tazobactam So] Rash    Has patient had a PCN reaction causing immediate rash, facial/tongue/throat swelling, SOB or lightheadedness with hypotension: No Has patient had a PCN reaction causing severe rash involving mucus membranes or skin necrosis: No Has patient had a PCN reaction that required hospitalization: Unknown--inpatient when reaction occurred Has patient had a PCN reaction occurring within the last 10 years: Yes If all of the above answers are "NO", then may proceed with Cephalosporin use.     Prior to Admission medications   Medication Sig Start Date End Date Taking? Authorizing Provider  albuterol (PROVENTIL HFA;VENTOLIN HFA) 108 (90 Base) MCG/ACT inhaler Inhale 2 puffs into the lungs every 6 (six) hours as needed for wheezing or shortness of breath. 01/25/18  Yes Shawnee Knapp, MD  amitriptyline (ELAVIL) 10 MG tablet Take 2 tablets (20 mg total) by mouth at bedtime. 03/23/18  Yes Shawnee Knapp, MD  amphetamine-dextroamphetamine (ADDERALL) 15 MG tablet  Take 1 tablet by mouth 2 (two) times daily with a meal. 02/24/18  Yes Shawnee Knapp, MD  azithromycin (ZITHROMAX) 250 MG tablet Take 2 pills by mouth on day 1, then 1 pill by mouth per day on days 2 through 5. 03/18/18  Yes Wendie Agreste, MD  BAYER MICROLET LANCETS lancets Use as instructed 06/13/17  Yes Shawnee Knapp, MD  CALCIUM-VITAMIN D PO Take 1 tablet by mouth 2 (two) times daily. chewable   Yes [provider]  clonazePAM (KLONOPIN) 0.5 MG tablet TAKE 1 TABLET BY MOUTH 3 TIMES DAILY AS NEEDED FOR ANXIETY Patient taking differently: Take 0.5 mg by mouth 3 (three) times daily as needed for anxiety.   11/01/17  Yes Shawnee Knapp, MD  Cyanocobalamin (VITAMIN B-12 SL) Place 1 drop under the tongue daily. 1 dropperful   Yes [provider]  cyclobenzaprine (FLEXERIL) 5 MG tablet Take 1 tablet (5 mg total) by mouth 3 (three) times daily as needed for muscle spasms. May take 2 tabs po qhs 06/10/17  Yes Shawnee Knapp, MD  desvenlafaxine (PRISTIQ) 100 MG 24 hr tablet Take 1 tablet (100 mg total) by mouth daily. 01/25/18  Yes Shawnee Knapp, MD  docusate sodium (COLACE) 100 MG capsule Take 100 mg by mouth daily.   Yes [provider]  eletriptan (RELPAX) 40 MG tablet Take 1 tablet (40 mg total) by mouth every 2 (two) hours as needed for migraine or headache. Do not use >2 doses/24 hours 03/23/18  Yes Shawnee Knapp, MD  glucose blood (CONTOUR NEXT TEST) test strip 1 each by Other route 4 (four) times daily as needed (hypoglycemia). K91.2 06/10/17  Yes Shawnee Knapp, MD  lactulose (CHRONULAC) 10 GM/15ML solution Take 30 g by mouth daily as needed for moderate constipation.  01/14/16  Yes [provider]  magic mouthwash w/lidocaine SOLN Take 5 mLs by mouth 4 (four) times daily as needed for mouth pain. 03/14/18  Yes Wendie Agreste, MD  Multiple Vitamins-Minerals (BARIATRIC MULTIVITAMINS/IRON PO) Take 1 tablet by mouth 3 (three) times daily.   Yes [provider]  Nutritional Supplements (FEEDING SUPPLEMENT, VITAL HIGH PROTEIN,) LIQD liquid Place 1,000 mLs into feeding tube daily. 43ml/hr for 8hrs a day as needed for poor oral intake; 300 ml free water q4hr Patient taking differently: Place 1,000 mLs into feeding tube daily. 69ml/hr for 8hrs a day as needed for poor oral intake; 300 ml free water q4hr 02/24/16  Yes Greer Pickerel, MD  nystatin (MYCOSTATIN) 100000 UNIT/ML suspension 5 ml po qid. Swish in mouth and gargle as long as possible then swallowg. Stop 48hrs AFTER symptoms gone 01/25/18  Yes Shawnee Knapp, MD  ondansetron (ZOFRAN ODT) 4 MG disintegrating tablet Take 1 tablet (4 mg total)  by mouth every 8 (eight) hours as needed for nausea or vomiting. 03/22/18  Yes McDonald, Mia A, PA-C  oxyCODONE (OXY IR/ROXICODONE) 5 MG immediate release tablet Take 1-2 tablets (5-10 mg total) by mouth every 6 (six) hours as needed for severe pain. Patient taking differently: Take 2.5 mg by mouth every 6 (six) hours as needed for severe pain.  01/25/18  Yes Shawnee Knapp, MD  polyethylene glycol [facility] Floria Raveling) packet Take 17 g by mouth daily as needed (for constipation.).    Yes [provider]  simethicone (MYLICON) 80 MG chewable tablet Chew 80 mg by mouth every 6 (six) hours as needed for flatulence.   Yes [provider]  topiramate (  TOPAMAX) 50 MG tablet TAKE 1.5 TABLETS (75 MG TOTAL) BY MOUTH DAILY. 03/15/18  Yes Kathrynn Ducking, MD  Vitamin D, Ergocalciferol, (DRISDOL) 1.25 MG (50000 UT) CAPS capsule Take 1 capsule by mouth daily.   Yes [provider]    Past Medical History:  Diagnosis Date  . Alopecia   . Anxiety   . Autoimmune disease ([place])    "Alopecia"  . Bacterial vaginosis   . Cyst of spleen   . Depression   . Frequent UTI   . Hypertrophy of breast   . IIH (idiopathic intracranial hypertension) 2017   "related to headaches"  . Migraine headache    denies, ruled out - Propranolol.Using for headaches    Past Surgical History:  Procedure Laterality Date  . APPENDECTOMY    . BREAST EXCISIONAL BIOPSY Right 2017   removed at time of reduction  . BREAST REDUCTION SURGERY Bilateral 03/26/2015   Procedure: BILATERAL BREAST REDUCTION  ;  Surgeon: Youlanda Roys, MD;  Location: [facility];  Service: Plastics;  Laterality: Bilateral;  . BREATH TEK H PYLORI N/A 05/21/2014   Procedure: BREATH TEK H PYLORI;  Surgeon: Greer Pickerel, MD;  Location: [facility];  Service: General;  Laterality: N/A;  . CESAREAN SECTION    . CESAREAN SECTION    . CHOLECYSTECTOMY    . DILITATION & CURRETTAGE/HYSTROSCOPY WITH NOVASURE ABLATION N/A  07/19/2012   Procedure: HYSTEROSCOPY WITH NOVASURE ABLATION;  Surgeon: Farrel Gobble. Harrington Challenger, MD;  Location: [facility];  Service: Gynecology;  Laterality: N/A;  . ENDOMETRIAL ABLATION    . ESOPHAGOGASTRODUODENOSCOPY N/A 08/16/2015   Procedure: UPPER ESOPHAGOGASTRODUODENOSCOPY (EGD);  Surgeon: Greer Pickerel, MD;  Location: [facility];  Service: General;  Laterality: N/A;  . ESOPHAGOGASTRODUODENOSCOPY N/A 02/21/2016   Procedure: ESOPHAGOGASTRODUODENOSCOPY (EGD);  Surgeon: Greer Pickerel, MD;  Location: [facility];  Service: General;  Laterality: N/A;  . ESOPHAGOGASTRODUODENOSCOPY N/A 02/24/2017   Procedure: ESOPHAGOGASTRODUODENOSCOPY (EGD);  Surgeon: Greer Pickerel, MD;  Location[facility];  Service: General;  Laterality: N/A;  . IR GENERIC HISTORICAL  10/04/2015   IR [place] Vivianne Master 10/04/2015 Markus Daft, MD [facility]  . IR GENERIC HISTORICAL  01/31/2016   IR [place] GASTRO/COLONIC TUBE PERCUT W/FLUORO 01/31/2016 Corrie Mckusick, DO [facility]  . IR GENERIC HISTORICAL  04/06/2016   IR [place] TUBE PERCUT W/FLUORO 04/06/2016 Arne Cleveland, MD [facility]  . IR GENERIC HISTORICAL  05/14/2016   IR GASTRIC TUBE PERC CHG W/O IMG GUIDE 05/14/2016 Sandi Mariscal, MD [facility]  . LAPAROSCOPIC GASTROSTOMY N/A 09/30/2015   Procedure: LAPAROSCOPIC GASTROSTOMY TUBE PLACEMENT;  Surgeon: Greer Pickerel, MD;  Location: [facility];  Service: General;  Laterality: N/A;  . LAPAROSCOPIC ROUX-EN-Y GASTRIC BYPASS WITH HIATAL HERNIA REPAIR N/A 07/08/2015   Procedure: LAPAROSCOPIC ROUX-EN-Y GASTRIC BYPASS WITH HIATAL HERNIA REPAIR WITH UPPER ENDOSCOPY;  Surgeon: Greer Pickerel, MD;  Location: [facility];  Service: General;  Laterality: N/A;  . LAPAROSCOPIC SMALL BOWEL RESECTION N/A 09/30/2015   Procedure: LAPAROSCOPIC REVISION OF ROUX LIMB;  Surgeon: Greer Pickerel, MD;  Location: [facility];  Service: General;  Laterality: N/A;  . LAPAROSCOPY N/A 09/30/2015   Procedure: LAPAROSCOPY DIAGNOSTIC;  Surgeon: Greer Pickerel,  MD;  Location: [facility];  Service: General;  Laterality: N/A;  . REDUCTION MAMMAPLASTY Bilateral 2017  . TUBAL LIGATION    . WISDOM TOOTH EXTRACTION      Social History   Tobacco Use  . Smoking status: Never Smoker  . Smokeless tobacco: Never Used  Substance  Use Topics  . Alcohol use: Not Currently    Family History  Problem Relation Age of Onset  . Cancer Mother        late 54'-50  . Breast cancer Mother        late 78'-50  . Stroke Father   . Diabetes Brother   . Seizures Son   . Breast cancer Maternal Aunt        late 40'-50  . Breast cancer Maternal Aunt        late 40'-50  . Breast cancer Maternal Aunt        late 40'-50  . Migraines Neg Hx     ROS Per hpi  OBJECTIVE:  Blood pressure 136/88, pulse 74, temperature 99.2 F (37.3 C), temperature source Oral, resp. rate 17, height 5\' 8"  (1.727 m), weight 193 lb (87.5 kg), SpO2 98 %. Body mass index is 29.35 kg/m.   Physical Exam Vitals signs and nursing note reviewed.  Constitutional:      Appearance: She is well-developed.  HENT:     Head: Normocephalic and atraumatic.     Jaw: No tenderness or pain on movement.     Right Ear: Hearing, tympanic membrane, ear canal and external ear normal.     Left Ear: Hearing, tympanic membrane, ear canal and external ear normal.     Mouth/Throat:     Mouth: No oral lesions.     Tongue: No lesions.     Palate: No lesions.     Pharynx: Posterior oropharyngeal erythema (mild patchy) present. No pharyngeal swelling, oropharyngeal exudate or uvula swelling.     Tonsils: Swelling: 1+ on the left.  Eyes:     Conjunctiva/sclera: Conjunctivae normal.     Pupils: Pupils are equal, round, and reactive to light.  Neck:     Musculoskeletal: Neck supple.  Cardiovascular:     Rate and Rhythm: Normal rate and regular rhythm.     Heart sounds: Normal heart sounds. No murmur. No friction rub. No gallop.   Pulmonary:     Effort: Pulmonary effort is normal.     Breath sounds: Normal  breath sounds. No wheezing or rales.  Lymphadenopathy:     Cervical: No cervical adenopathy.  Skin:    General: Skin is warm and dry.  Neurological:     Mental Status: She is alert and oriented to person, place, and time.     ASSESSMENT and PLAN  1. Sore throat Unclear etiology. Checking for resolution of strep, mono given fatigue, discussed possible untreated gerd being cause. Starting 2 week of PPI, refilling nystatin as that provided more relief. Discuss with ENT re proposed surgery - Culture, Group A Strep  2. Glossitis Discussed supportive measures, avoidance of repetitive trauma  3. Gastroesophageal reflux disease, esophagitis presence not specified See #1  4. S/P bariatric surgery  Other orders - nystatin (MYCOSTATIN) 100000 UNIT/ML suspension; 5 ml po qid. Swish in mouth and gargle as long as possible then swallowg. Stop 48hrs AFTER symptoms gone - omeprazole (PRILOSEC) 20 MG capsule; Take 1 capsule (20 mg total) by mouth 2 (two) times daily before a meal for 14 days.  Return if symptoms worsen or fail to improve.    Rutherford Guys, MD [facility] at [facility] [place], [place] [address] Ph.  [phone] Fax [phone]

## 2018-04-27 ENCOUNTER — Other Ambulatory Visit: Payer: Self-pay

## 2018-04-27 ENCOUNTER — Telehealth: Payer: Self-pay | Admitting: Family Medicine

## 2018-04-27 ENCOUNTER — Ambulatory Visit: Payer: Self-pay | Admitting: *Deleted

## 2018-04-27 DIAGNOSIS — R1013 Epigastric pain: Secondary | ICD-10-CM

## 2018-04-27 DIAGNOSIS — R5382 Chronic fatigue, unspecified: Secondary | ICD-10-CM

## 2018-04-27 DIAGNOSIS — G894 Chronic pain syndrome: Secondary | ICD-10-CM

## 2018-04-27 DIAGNOSIS — R519 Headache, unspecified: Secondary | ICD-10-CM

## 2018-04-27 DIAGNOSIS — G8929 Other chronic pain: Secondary | ICD-10-CM

## 2018-04-27 DIAGNOSIS — F329 Major depressive disorder, single episode, unspecified: Secondary | ICD-10-CM

## 2018-04-27 DIAGNOSIS — R51 Headache: Secondary | ICD-10-CM

## 2018-04-27 DIAGNOSIS — R413 Other amnesia: Secondary | ICD-10-CM

## 2018-04-27 DIAGNOSIS — Z9884 Bariatric surgery status: Secondary | ICD-10-CM

## 2018-04-27 LAB — EPSTEIN-BARR VIRUS (EBV) ANTIBODY PROFILE
EBV NA IgG: 551 U/mL — ABNORMAL HIGH (ref 0.0–17.9)
EBV VCA IgG: 76.9 U/mL — ABNORMAL HIGH (ref 0.0–17.9)
EBV VCA IgM: <36 U/mL (ref 0.0–35.9)

## 2018-04-27 NOTE — Telephone Encounter (Signed)
Labs has been order for pt and will call pt with results.

## 2018-04-27 NOTE — Telephone Encounter (Signed)
Please let pt know that unfortunately, I will no longer be at [facility] after April 2020 - currently my last clinic is planned for 4/3 - so she will need to establish with another provider for her future primary care.  However, it might be difficult for her to find a PCP that is willing to manage her chronic pain as well as her reactive pyschiatric complications with fatigue, depression, memory/attention issues.  Due to current regulations and recommendations, many PCPs are requiring that patients receive any chronic controlled medications through specialists trained in the use and management if these.  Therefore, the most effective way to ensure that she has continuity of care and a provider willing to manager her current medications is to refer her out to pain management and psychiatry specialists. I placed referral to the [facility] departments for both of these. They often are scheduling 4+ months out so recommend scheduling asap and also rec sched final appt with me now prior to 4/3 to ensure she has enough medication to bridge her to those visits.

## 2018-04-27 NOTE — Addendum Note (Signed)
Addended by: Dierdre Searles on: 04/27/2018 10:38 AM   Modules accepted: Orders

## 2018-04-27 NOTE — Telephone Encounter (Signed)
Pt reports with OV yesterday labs were drawn. Pt calling to ask if lab work from yesterday showed she was dehydrated. Throat Cx noted, no labs pending, could not find order for labs. Pt asking if CMET could be added to blood drawn yesterday if not ordered at [facility]. Pt states she feels she may be dehydrated; same symptoms as yesterday; fatigue, headache, lightheadedness; not worsening.   Please advise: [phone]  Reason for Disposition . [1] Caller requesting NON-URGENT health information AND [2] PCP's office is the best resource  Answer Assessment - Initial Assessment Questions 1. REASON FOR CALL or QUESTION: "What is your reason for calling today?" or "How can I best help you?" or "What question do you have that I can help answer?"    Results  Protocols used: INFORMATION ONLY CALL-A-AH

## 2018-04-28 LAB — COMPREHENSIVE METABOLIC PANEL
ALT: 15 IU/L (ref 0–32)
AST: 23 IU/L (ref 0–40)
Albumin/Globulin Ratio: 1.3 (ref 1.2–2.2)
Albumin: 3.6 g/dL — ABNORMAL LOW (ref 3.8–4.8)
Alkaline Phosphatase: 61 IU/L (ref 39–117)
BUN/Creatinine Ratio: 16 (ref 9–23)
BUN: 19 mg/dL (ref 6–24)
Bilirubin Total: <0.2 mg/dL (ref 0.0–1.2)
CO2: 19 mmol/L — ABNORMAL LOW (ref 20–29)
Calcium: 8.3 mg/dL — ABNORMAL LOW (ref 8.7–10.2)
Chloride: 104 mmol/L (ref 96–106)
Creatinine, Ser: 1.17 mg/dL — ABNORMAL HIGH (ref 0.57–1.00)
GFR calc Af Amer: 64 mL/min/{1.73_m2} (ref >59)
GFR calc non Af Amer: 55 mL/min/{1.73_m2} — ABNORMAL LOW (ref >59)
Globulin, Total: 2.7 g/dL (ref 1.5–4.5)
Glucose: 72 mg/dL (ref 65–99)
Potassium: 4.3 mmol/L (ref 3.5–5.2)
Sodium: 137 mmol/L (ref 134–144)
Total Protein: 6.3 g/dL (ref 6.0–8.5)

## 2018-04-28 LAB — SPECIMEN STATUS REPORT

## 2018-04-28 LAB — CULTURE, GROUP A STREP: Strep A Culture: NEGATIVE

## 2018-04-28 MED FILL — XULANE PATCH: 150-35 | 21 days supply | Qty: 3 | Fill #7

## 2018-04-28 MED FILL — oxyCODONE HCL 5 MG TABS: 5 | 15 days supply | Qty: 120 | Fill #0

## 2018-04-28 MED FILL — AMPHETAMINE-DEXTROAMPHETAMI: 15 | 30 days supply | Qty: 60 | Fill #0

## 2018-05-03 NOTE — Telephone Encounter (Signed)
Relayed message to pt.  Placed on hold to schedule f/u.

## 2018-05-04 ENCOUNTER — Telehealth: Payer: Self-pay | Admitting: Family Medicine

## 2018-05-04 ENCOUNTER — Ambulatory Visit: Payer: BLUE CROSS/BLUE SHIELD | Admitting: Family Medicine

## 2018-05-04 NOTE — Telephone Encounter (Signed)
Called pt as she couldn't be seen due to insurance. Last Wed saw [place]. Found h/o mono. Restarted nystatin switch. Dentist didn't contribute any helpful info. ENT recommended tonsils being taken out. Rt side was very inflammed - ENT doctor noted that her tonsils get tonsilliths which might be contributing to the inflammation.  She does have some TMJ symptoms - Rt > Lt No artificial sweetners  Does occ have some grenadine in soda - drinks some every day - likes flat - without the fizz  She did change a toothpaste to one that did not burn or sting - for gum/bacteria.   Tongue will feel itchy and even lips will feel itchy on the outside - even gums will feel itchy.   Has start ppi x 14d but hasn't noticed a difference yet.   Pt will call her insurance and figure out who is on her formulary. Then we will find her a PCP, pain mngmnt, and psych that are on her plan.  Pt will do a 1-2 wks trial off of artificial color additives and cont to avoid artificial sweetners.

## 2018-05-17 ENCOUNTER — Other Ambulatory Visit: Payer: Self-pay | Admitting: Family Medicine

## 2018-05-17 MED FILL — AMITRIPTYLINE HCL 10 MG TAB: 10 | 30 days supply | Qty: 60 | Fill #0

## 2018-05-17 MED FILL — TOPIRAMATE 50 MG TABLET: 50 | 30 days supply | Qty: 45 | Fill #1

## 2018-05-17 MED FILL — CLINDAMYCIN HCL 300 MG CAPS: 300 | 10 days supply | Qty: 30 | Fill #0

## 2018-05-17 NOTE — Telephone Encounter (Signed)
Requested medication (s) are due for refill today: yes to both meds requested  Requested medication (s) are on the active medication list: yes to both  Last refill:  Cyclobenzaprine: 06/10/17 #40 1 RF                   Clonazepam: 11/01/17  Future visit scheduled: no  Notes to clinic:  Former pt Dr Brigitte Pulse   Requested Prescriptions  Pending Prescriptions Disp Refills   cyclobenzaprine (FLEXERIL) 5 MG tablet [Pharmacy Med Name: CYCLOBENZAPRINE HCL 5 MG TA 5 TAB] 40 tablet 1    Sig: TAKE 1 TABLET BY MOUTH 3 TIMES DAILY AS NEEDED FOR MUSCLE SPASMS. MAY TAKE 2 TABS BY MOUTH AT BEDTIME     Not Delegated - Analgesics:  Muscle Relaxants Failed - 05/17/2018  7:59 AM      Failed - This refill cannot be delegated      Passed - Valid encounter within last 6 months    Recent Outpatient Visits          3 weeks ago Sore throat   [facility], Lilia Argue, MD   2 months ago Glossitis   [facility], Ranell Patrick, MD   3 months ago Transaminitis   [facility], Laurey Arrow, MD   8 months ago Dysuria   [facility], Laurey Arrow, MD   11 months ago Urinary frequency   [facility], Laurey Arrow, MD            clonazePAM (KLONOPIN) 0.5 MG tablet [Pharmacy Med Name: clonazePAM 0.5 MG TABS 0.5 TAB] 90 tablet 1    Sig: TAKE 1 TABLET BY MOUTH 3 TIMES DAILY AS NEEDED FOR ANXIETY     Not Delegated - Psychiatry:  Anxiolytics/Hypnotics Failed - 05/17/2018  7:59 AM      Failed - This refill cannot be delegated      Failed - Urine Drug Screen completed in last 360 days.      Passed - Valid encounter within last 6 months    Recent Outpatient Visits          3 weeks ago Sore throat   [facility], Lilia Argue, MD   2 months ago Glossitis   [facility], MD   3 months ago Transaminitis   [facility], Laurey Arrow, MD   8 months ago Dysuria   [facility], Laurey Arrow, MD   11 months  ago Urinary frequency   [facility] at [facility], Laurey Arrow, MD

## 2018-05-19 NOTE — Telephone Encounter (Signed)
Please see message below and advise on refill. Pt was last seen 04/2018

## 2018-05-25 MED FILL — clonazePAM 0.5 MG TABS: 0.5 | 10 days supply | Qty: 30 | Fill #0

## 2018-05-25 MED FILL — CYCLOBENZAPRINE HCL 5 MG TA: 5 | 8 days supply | Qty: 40 | Fill #0

## 2018-05-25 NOTE — Telephone Encounter (Signed)
pmp reviewd, appropriate meds refilled 

## 2018-05-26 MED FILL — FLUCONAZOLE 150 MG TABS: 150 | 1 days supply | Qty: 1 | Fill #0

## 2018-05-26 MED FILL — DESVENLAFAXINE SUC ER 100 M: 100 | 90 days supply | Qty: 90 | Fill #1

## 2018-05-26 MED FILL — XULANE PATCH: 150-35 | 28 days supply | Qty: 3 | Fill #0

## 2018-06-06 MED FILL — BELBUCA 300 MCG FILM: 300 | 30 days supply | Qty: 60 | Fill #0

## 2018-06-15 ENCOUNTER — Telehealth: Payer: Self-pay | Admitting: Family Medicine

## 2018-06-15 NOTE — Telephone Encounter (Signed)
Copied from [place] [id number]. Topic: Quick Communication - Rx Refill/Question >> Jun 15, 2018  4:53 PM Lionel December wrote: Medication: amphetamine-dextroamphetamine (ADDERALL) 15 MG tablet, clonazePAM (KLONOPIN) 0.5 MG tablet, cyclobenzaprine (FLEXERIL) 5 MG tablet,  amitriptyline (ELAVIL) 10 MG tablet , topiramate (TOPAMAX) 50 MG tablet, desvenlafaxine (PRISTIQ) 100 MG 24 hr tablet  Has the patient contacted their pharmacy? Yes.   (Agent: If no, request that the patient contact the pharmacy for the refill.) (Agent: If yes, when and what did the pharmacy advise?)  Preferred Pharmacy (with phone number or street name): [place], [place] - [place] [phone] (Phone) [phone] (Fax)    Agent: Please be advised that RX refills may take up to 3 business days. We ask that you follow-up with your pharmacy.

## 2018-06-17 MED ORDER — CLONAZEPAM 0.5 MG PO TABS: 0.5000 mg | ORAL_TABLET | Freq: Every day | ORAL | 2 refills | Status: DC | PRN

## 2018-06-17 MED ORDER — TOPIRAMATE 50 MG PO TABS: 75.0000 mg | ORAL_TABLET | Freq: Every day | ORAL | 2 refills | Status: DC

## 2018-06-17 MED ORDER — AMPHETAMINE-DEXTROAMPHETAMINE 15 MG PO TABS: 15.0000 mg | ORAL_TABLET | Freq: Two times a day (BID) | ORAL | 0 refills | Status: DC

## 2018-06-17 MED ORDER — DESVENLAFAXINE SUCCINATE ER 100 MG PO TB24: 100.0000 mg | ORAL_TABLET | Freq: Every day | ORAL | 0 refills | Status: DC

## 2018-06-17 MED ORDER — CYCLOBENZAPRINE HCL 5 MG PO TABS: ORAL_TABLET | ORAL | 2 refills | Status: AC

## 2018-06-17 MED ORDER — AMITRIPTYLINE HCL 10 MG PO TABS: 20.0000 mg | ORAL_TABLET | Freq: Every day | ORAL | 2 refills | Status: DC

## 2018-06-17 MED FILL — AMITRIPTYLINE HCL 10 MG TAB: 10 | 30 days supply | Qty: 60 | Fill #0

## 2018-06-17 MED FILL — AMPHETAMINE-DEXTROAMPHETAMI: 15 | 30 days supply | Qty: 60 | Fill #0

## 2018-06-17 MED FILL — XULANE PATCH: 150-35 | 21 days supply | Qty: 3 | Fill #0

## 2018-06-17 MED FILL — TOPIRAMATE 50 MG TABLET: 50 | 30 days supply | Qty: 45 | Fill #0

## 2018-06-17 NOTE — Telephone Encounter (Signed)
PCP Dr Brigitte Pulse Pmp reviewed Medications refilled for 3 months [place], please call/mail letter to patient stating that her previous PCP is no longer at this practice. That given practice style differences and current demand on a significant smaller provider group, she will need to either follow Dr Brigitte Pulse at her new practice (please provide contact information) or establish with a new PCP. Please provide list of possible providers. I unfortunately will no be able to take her on as a patient at this time.  [place] Thank you.

## 2018-06-17 NOTE — Telephone Encounter (Signed)
Please refill is possible say you last.

## 2018-06-29 ENCOUNTER — Ambulatory Visit (HOSPITAL_COMMUNITY): Payer: Self-pay | Admitting: Psychiatry

## 2018-06-29 NOTE — Telephone Encounter (Signed)
Forwarding per our discussion.

## 2018-07-01 MED FILL — BELBUCA 300 MCG FILM: 300 | 30 days supply | Qty: 60 | Fill #0

## 2018-07-03 MED FILL — XULANE PATCH: 150-35 | 28 days supply | Qty: 3 | Fill #0

## 2018-07-04 MED FILL — CYCLOBENZAPRINE HCL 5 MG TA: 5 | 8 days supply | Qty: 40 | Fill #1

## 2018-07-06 MED FILL — ONDANSETRON ODT 4 MG TABLET: 4 | 5 days supply | Qty: 15 | Fill #0

## 2018-07-18 HISTORY — PX: CELIAC PLEXUS BLOCK: SHX1320

## 2018-07-18 MED FILL — clonazePAM 0.5 MG TABS: 0.5 | 30 days supply | Qty: 30 | Fill #0

## 2018-07-22 DIAGNOSIS — K5909 Other constipation: Secondary | ICD-10-CM | POA: Insufficient documentation

## 2018-08-02 MED FILL — AMITRIPTYLINE HCL 10 MG TAB: 10 | 30 days supply | Qty: 60 | Fill #1

## 2018-08-02 MED FILL — ELETRIPTAN HBR 40 MG TABLET: 40 | 25 days supply | Qty: 10 | Fill #1

## 2018-08-02 MED FILL — BELBUCA 300 MCG FILM: 300 | 30 days supply | Qty: 60 | Fill #1

## 2018-08-02 MED FILL — AMPHETAMINE-DEXTROAMPHETAMI: 15 | 30 days supply | Qty: 60 | Fill #0

## 2018-08-02 MED FILL — CYCLOBENZAPRINE HCL 5 MG TA: 5 | 10 days supply | Qty: 40 | Fill #0

## 2018-08-02 MED FILL — XULANE PATCH: 150-35 | 28 days supply | Qty: 3 | Fill #1

## 2018-08-02 MED FILL — TOPIRAMATE 50 MG TABLET: 50 | 30 days supply | Qty: 45 | Fill #1

## 2018-08-12 MED FILL — NITROFURANTOIN MONO-MCR 100: 100 | 7 days supply | Qty: 14 | Fill #0

## 2018-08-12 MED FILL — FLUCONAZOLE 150 MG TABS: 150 | 2 days supply | Qty: 2 | Fill #0

## 2018-08-29 MED FILL — clonazePAM 0.5 MG TABS: 0.5 | 30 days supply | Qty: 30 | Fill #1

## 2018-08-29 MED FILL — AMITRIPTYLINE HCL 10 MG TAB: 10 | 30 days supply | Qty: 60 | Fill #2

## 2018-08-29 MED FILL — DESVENLAFAXINE SUC ER 100 M: 100 | 90 days supply | Qty: 90 | Fill #0

## 2018-08-29 MED FILL — CYCLOBENZAPRINE HCL 5 MG TA: 5 | 8 days supply | Qty: 40 | Fill #1

## 2018-08-29 MED FILL — TOPIRAMATE 50 MG TABLET: 50 | 30 days supply | Qty: 45 | Fill #2

## 2018-08-30 MED FILL — AMPHETAMINE-DEXTROAMPHETAMI: 15 | 30 days supply | Qty: 60 | Fill #0

## 2018-08-30 MED FILL — XULANE PATCH: 150-35 | 21 days supply | Qty: 3 | Fill #2

## 2018-08-30 MED FILL — BELBUCA 300 MCG FILM: 300 | 30 days supply | Qty: 60 | Fill #0

## 2018-09-22 ENCOUNTER — Ambulatory Visit (INDEPENDENT_AMBULATORY_CARE_PROVIDER_SITE_OTHER): Payer: Medicare Other | Admitting: Family Medicine

## 2018-09-22 ENCOUNTER — Other Ambulatory Visit: Payer: Self-pay

## 2018-09-22 ENCOUNTER — Encounter: Payer: Self-pay | Admitting: Family Medicine

## 2018-09-22 ENCOUNTER — Telehealth: Payer: Self-pay

## 2018-09-22 VITALS — BP 125/86 | HR 89 | Temp 98.8°F | Ht 68.0 in | Wt 194.0 lb

## 2018-09-22 DIAGNOSIS — G932 Benign intracranial hypertension: Secondary | ICD-10-CM

## 2018-09-22 DIAGNOSIS — N3000 Acute cystitis without hematuria: Secondary | ICD-10-CM | POA: Diagnosis not present

## 2018-09-22 DIAGNOSIS — G8929 Other chronic pain: Secondary | ICD-10-CM

## 2018-09-22 DIAGNOSIS — F988 Other specified behavioral and emotional disorders with onset usually occurring in childhood and adolescence: Secondary | ICD-10-CM

## 2018-09-22 DIAGNOSIS — R5382 Chronic fatigue, unspecified: Secondary | ICD-10-CM

## 2018-09-22 DIAGNOSIS — F4323 Adjustment disorder with mixed anxiety and depressed mood: Secondary | ICD-10-CM

## 2018-09-22 DIAGNOSIS — Z931 Gastrostomy status: Secondary | ICD-10-CM

## 2018-09-22 DIAGNOSIS — Z9884 Bariatric surgery status: Secondary | ICD-10-CM

## 2018-09-22 DIAGNOSIS — R3915 Urgency of urination: Secondary | ICD-10-CM | POA: Diagnosis not present

## 2018-09-22 DIAGNOSIS — R1013 Epigastric pain: Secondary | ICD-10-CM

## 2018-09-22 LAB — POCT URINALYSIS DIP (MANUAL ENTRY)
Bilirubin, UA: NEGATIVE
Blood, UA: NEGATIVE
Glucose, UA: NEGATIVE mg/dL
Ketones, POC UA: NEGATIVE mg/dL
Leukocytes, UA: NEGATIVE
Nitrite, UA: NEGATIVE
Protein Ur, POC: 100 mg/dL — AB
Spec Grav, UA: 1.015 (ref 1.010–1.025)
Urobilinogen, UA: 1 E.U./dL
pH, UA: 8.5 — AB (ref 5.0–8.0)

## 2018-09-22 LAB — POC MICROSCOPIC URINALYSIS (UMFC): Mucus: ABSENT

## 2018-09-22 MED ORDER — NITROFURANTOIN MONOHYD MACRO 100 MG PO CAPS: 100.0000 mg | ORAL_CAPSULE | Freq: Two times a day (BID) | ORAL | 0 refills | Status: DC

## 2018-09-22 MED ORDER — FLUCONAZOLE 150 MG PO TABS: 150.0000 mg | ORAL_TABLET | Freq: Once | ORAL | 0 refills | Status: AC

## 2018-09-22 MED FILL — NITROFURANTOIN MONO-MCR 100: 100 | 10 days supply | Qty: 20 | Fill #0

## 2018-09-22 MED FILL — FLUCONAZOLE 150 MG TABS: 150 | 1 days supply | Qty: 1 | Fill #0

## 2018-09-22 NOTE — Patient Instructions (Signed)
° ° ° °  If you have lab work done today you will be contacted with your lab results within the next 2 weeks.  If you have not heard from us then please contact us. The fastest way to get your results is to register for My Chart. ° ° °IF you received an x-ray today, you will receive an invoice from [facility]. Please contact [facility] at [phone] with questions or concerns regarding your invoice.  ° °IF you received labwork today, you will receive an invoice from [facility]. Please contact [facility] at [phone] with questions or concerns regarding your invoice.  ° °Our billing staff will not be able to assist you with questions regarding bills from these companies. ° °You will be contacted with the lab results as soon as they are available. The fastest way to get your results is to activate your My Chart account. Instructions are located on the last page of this paperwork. If you have not heard from us regarding the results in 2 weeks, please contact this office. °  ° ° ° °

## 2018-09-22 NOTE — Telephone Encounter (Signed)
Pls faxed med records to [facility] for Mar 09, 2017 to present [place] to fax number [phone]

## 2018-09-22 NOTE — Progress Notes (Signed)
7/16/20208:27 AM  Margaret Fletcher 1971/01/24, 48 y.o., female [id number]  Chief Complaint  Patient presents with  . Urinary Urgency    for the past 2 days taking no otc for the symptoms, with some itching  . Form Completion    diability form    HPI:   Patient is a 48 y.o. female with past medical history significant for migraine, OSA, constipation, empty sella syndrome, G tube feeding, s/p gastric bypass who presents today for UTI sx, requesting renewal of long term disability paperwork  For past 2 days Has been having low back and urinary urgency with mild dysuria, fatigued Denies any fever or chills No worsening nausea or abd pain Treated for UTI and yeast infection by gyn in May, tx with nitrofurantoin, no culture sent She is on xulane Reports h/o chronic UTIs  Needing forms for long term disability thru work Stopped working may 2017 Used to work for [employer], 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 Has been on long term disability since 2017 On disability after significant sequela from gastric bypass surgery in 2017, chronic abd pain, nausea and vomiting, requiring permanent feeding tube, requiring multiple hosp and ED visits, Recently had celiac nerve block Also with empty sella syndrome, she was seeing neurology, Dr Milta Deiters, lost followup due to insurance coverage, now has medicare, so now will be able to re-establish with her  She is limited to pushing, pulling or reaching over head She cant lift more than 5-10 lbs  She is limited by g tube placement, risk of g tube coming out of place, which has happened before Cant sit for more than an hour without needing lumbar support and left flank Also limited periodically by increased intracranial pressure affecting left eye vision, hand tingling numbness, can affect speech Driving is limited to locally Needs helps with IADLs, such as grocery shopping and house cleaning  Pain medicine Britta Mccreedy, [facility] Inter[facility] pain  Dr Evelene Croon, [place] Bariatric surgeon, Dr Redmond Pulling, [facility] Surgery Neuro, Dr Milta Deiters, [facility], needs to establish again Needs a new psychiatrist  Previous pcp Dr Brigitte Pulse Has not established with new pcp yet   Depression screen [facility] 2/9 04/26/2018 01/25/2018 08/25/2017  Decreased Interest 0 1 0  Down, Depressed, Hopeless 0 1 0  PHQ - 2 Score 0 2 0  Altered sleeping 0 3 -  Tired, decreased energy 0 1 -  Change in appetite 0 1 -  Feeling bad or failure about yourself  0 1 -  Trouble concentrating 0 0 -  Moving slowly or fidgety/restless 0 0 -  Suicidal thoughts 0 0 -  PHQ-9 Score 0 8 -  Difficult doing work/chores Not difficult at all - -  Some recent data might be hidden    Fall Risk  04/26/2018 04/26/2018 03/14/2018 01/25/2018 08/25/2017  Falls in the past year? 0 0 1 1 No  Number falls in past yr: 0 - 0 0 -  Injury with Fall? 0 - 1 1 -  Comment - - left knee - -  Follow up - - - - -     Allergies  Allergen Reactions  . Sulfa Antibiotics Anaphylaxis  . Metoclopramide Other (See Comments)    Elevated prolactin >5x ULN induction lactation when taking scheduled for several months  . Zosyn [Piperacillin Sod-Tazobactam So] Rash    Has patient had a PCN reaction causing immediate rash, facial/tongue/throat swelling, SOB or lightheadedness with hypotension: No Has patient had a PCN reaction causing severe rash involving  mucus membranes or skin necrosis: No Has patient had a PCN reaction that required hospitalization: Unknown--inpatient when reaction occurred Has patient had a PCN reaction occurring within the last 10 years: Yes If all of the above answers are "NO", then may proceed with Cephalosporin use.     Prior to Admission medications   Medication Sig Start Date End Date Taking? Authorizing Provider  albuterol (PROVENTIL HFA;VENTOLIN HFA) 108 (90 Base) MCG/ACT inhaler Inhale 2 puffs into the lungs every 6 (six) hours as needed for wheezing or shortness of breath. 01/25/18    Shawnee Knapp, MD  amitriptyline (ELAVIL) 10 MG tablet Take 2 tablets (20 mg total) by mouth at bedtime. 06/17/18   Rutherford Guys, MD  amphetamine-dextroamphetamine (ADDERALL) 15 MG tablet Take 1 tablet by mouth 2 (two) times daily with a meal. 02/24/18   Shawnee Knapp, MD  amphetamine-dextroamphetamine (ADDERALL) 15 MG tablet Take 1 tablet by mouth 2 (two) times daily for 30 days. 08/17/18 09/16/18  Rutherford Guys, MD  amphetamine-dextroamphetamine (ADDERALL) 15 MG tablet Take 1 tablet by mouth 2 (two) times daily for 30 days. 07/17/18 08/16/18  Rutherford Guys, MD  amphetamine-dextroamphetamine (ADDERALL) 15 MG tablet Take 1 tablet by mouth 2 (two) times daily for 30 days. 06/17/18 07/17/18  Rutherford Guys, MD  azithromycin (ZITHROMAX) 250 MG tablet Take 2 pills by mouth on day 1, then 1 pill by mouth per day on days 2 through 5. 03/18/18   Wendie Agreste, MD  BAYER MICROLET LANCETS lancets Use as instructed 06/13/17   Shawnee Knapp, MD  CALCIUM-VITAMIN D PO Take 1 tablet by mouth 2 (two) times daily. chewable    [provider]  clonazePAM (KLONOPIN) 0.5 MG tablet Take 1 tablet (0.5 mg total) by mouth daily as needed for anxiety. 06/17/18   Rutherford Guys, MD  Cyanocobalamin (VITAMIN B-12 SL) Place 1 drop under the tongue daily. 1 dropperful    [provider]  cyclobenzaprine (FLEXERIL) 5 MG tablet TAKE 1 TABLET BY MOUTH 3 TIMES DAILY AS NEEDED FOR MUSCLE SPASMS. MAY TAKE 2 TABS BY MOUTH AT BEDTIME 06/17/18   Rutherford Guys, MD  desvenlafaxine (PRISTIQ) 100 MG 24 hr tablet Take 1 tablet (100 mg total) by mouth daily. 06/17/18   Rutherford Guys, MD  docusate sodium (COLACE) 100 MG capsule Take 100 mg by mouth daily.    [provider]  eletriptan (RELPAX) 40 MG tablet Take 1 tablet (40 mg total) by mouth every 2 (two) hours as needed for migraine or headache. Do not use >2 doses/24 hours 03/23/18   Shawnee Knapp, MD  glucose blood (CONTOUR NEXT TEST) test strip 1 each by Other  route 4 (four) times daily as needed (hypoglycemia). K91.2 06/10/17   Shawnee Knapp, MD  lactulose (CHRONULAC) 10 GM/15ML solution Take 30 g by mouth daily as needed for moderate constipation.  01/14/16   [provider]  magic mouthwash w/lidocaine SOLN Take 5 mLs by mouth 4 (four) times daily as needed for mouth pain. 03/14/18   Wendie Agreste, MD  Multiple Vitamins-Minerals (BARIATRIC MULTIVITAMINS/IRON PO) Take 1 tablet by mouth 3 (three) times daily.    [provider]  Nutritional Supplements (FEEDING SUPPLEMENT, VITAL HIGH PROTEIN,) LIQD liquid Place 1,000 mLs into feeding tube daily. 44ml/hr for 8hrs a day as needed for poor oral intake; 300 ml free water q4hr Patient taking differently: Place 1,000 mLs into feeding tube daily. 89ml/hr for 8hrs a day  as needed for poor oral intake; 300 ml free water q4hr 02/24/16   Greer Pickerel, MD  nystatin (MYCOSTATIN) 100000 UNIT/ML suspension 5 ml po qid. Swish in mouth and gargle as long as possible then swallowg. Stop 48hrs AFTER symptoms gone 04/26/18   Rutherford Guys, MD  omeprazole (PRILOSEC) 20 MG capsule Take 1 capsule (20 mg total) by mouth 2 (two) times daily before a meal for 14 days. 04/26/18 05/10/18  Rutherford Guys, MD  ondansetron (ZOFRAN ODT) 4 MG disintegrating tablet Take 1 tablet (4 mg total) by mouth every 8 (eight) hours as needed for nausea or vomiting. 03/22/18   McDonald, Mia A, PA-C  oxyCODONE (OXY IR/ROXICODONE) 5 MG immediate release tablet Take 1-2 tablets (5-10 mg total) by mouth every 6 (six) hours as needed for severe pain. Patient taking differently: Take 2.5 mg by mouth every 6 (six) hours as needed for severe pain.  01/25/18   Shawnee Knapp, MD  polyethylene glycol [facility] Floria Raveling) packet Take 17 g by mouth daily as needed (for constipation.).     [provider]  simethicone (MYLICON) 80 MG chewable tablet Chew 80 mg by mouth every 6 (six) hours as needed for flatulence.    [provider]   topiramate (TOPAMAX) 50 MG tablet Take 1.5 tablets (75 mg total) by mouth daily. 06/17/18   Rutherford Guys, MD  Vitamin D, Ergocalciferol, (DRISDOL) 1.25 MG (50000 UT) CAPS capsule Take 1 capsule by mouth daily.    [provider]    Past Medical History:  Diagnosis Date  . Alopecia   . Anxiety   . Autoimmune disease ([place])    "Alopecia"  . Bacterial vaginosis   . Cyst of spleen   . Depression   . Frequent UTI   . Hypertrophy of breast   . IIH (idiopathic intracranial hypertension) 2017   "related to headaches"  . Migraine headache    denies, ruled out - Propranolol.Using for headaches    Past Surgical History:  Procedure Laterality Date  . APPENDECTOMY    . BREAST EXCISIONAL BIOPSY Right 2017   removed at time of reduction  . BREAST REDUCTION SURGERY Bilateral 03/26/2015   Procedure: BILATERAL BREAST REDUCTION  ;  Surgeon: Youlanda Roys, MD;  Location: [facility];  Service: Plastics;  Laterality: Bilateral;  . BREATH TEK H PYLORI N/A 05/21/2014   Procedure: BREATH TEK H PYLORI;  Surgeon: Greer Pickerel, MD;  Location: [facility];  Service: General;  Laterality: N/A;  . CESAREAN SECTION    . CESAREAN SECTION    . CHOLECYSTECTOMY    . DILITATION & CURRETTAGE/HYSTROSCOPY WITH NOVASURE ABLATION N/A 07/19/2012   Procedure: HYSTEROSCOPY WITH NOVASURE ABLATION;  Surgeon: Farrel Gobble. Harrington Challenger, MD;  Location: [facility];  Service: Gynecology;  Laterality: N/A;  . ENDOMETRIAL ABLATION    . ESOPHAGOGASTRODUODENOSCOPY N/A 08/16/2015   Procedure: UPPER ESOPHAGOGASTRODUODENOSCOPY (EGD);  Surgeon: Greer Pickerel, MD;  Location: [facility];  Service: General;  Laterality: N/A;  . ESOPHAGOGASTRODUODENOSCOPY N/A 02/21/2016   Procedure: ESOPHAGOGASTRODUODENOSCOPY (EGD);  Surgeon: Greer Pickerel, MD;  Location: [facility];  Service: General;  Laterality: N/A;  . ESOPHAGOGASTRODUODENOSCOPY N/A 02/24/2017   Procedure: ESOPHAGOGASTRODUODENOSCOPY (EGD);  Surgeon: Greer Pickerel, MD;   Location[facility];  Service: General;  Laterality: N/A;  . IR GENERIC HISTORICAL  10/04/2015   IR IXL Vivianne Master 10/04/2015 Markus Daft, MD [facility]  . IR GENERIC HISTORICAL  01/31/2016   IR REPLC GASTRO/COLONIC TUBE PERCUT W/FLUORO  01/31/2016 Corrie Mckusick, DO [facility]  . IR GENERIC HISTORICAL  04/06/2016   IR [place] TUBE PERCUT W/FLUORO 04/06/2016 Arne Cleveland, MD [facility]  . IR GENERIC HISTORICAL  05/14/2016   IR GASTRIC TUBE PERC CHG W/O IMG GUIDE 05/14/2016 Sandi Mariscal, MD [facility]  . LAPAROSCOPIC GASTROSTOMY N/A 09/30/2015   Procedure: LAPAROSCOPIC GASTROSTOMY TUBE PLACEMENT;  Surgeon: Greer Pickerel, MD;  Location: [facility];  Service: General;  Laterality: N/A;  . LAPAROSCOPIC ROUX-EN-Y GASTRIC BYPASS WITH HIATAL HERNIA REPAIR N/A 07/08/2015   Procedure: LAPAROSCOPIC ROUX-EN-Y GASTRIC BYPASS WITH HIATAL HERNIA REPAIR WITH UPPER ENDOSCOPY;  Surgeon: Greer Pickerel, MD;  Location: [facility];  Service: General;  Laterality: N/A;  . LAPAROSCOPIC SMALL BOWEL RESECTION N/A 09/30/2015   Procedure: LAPAROSCOPIC REVISION OF ROUX LIMB;  Surgeon: Greer Pickerel, MD;  Location: [facility];  Service: General;  Laterality: N/A;  . LAPAROSCOPY N/A 09/30/2015   Procedure: LAPAROSCOPY DIAGNOSTIC;  Surgeon: Greer Pickerel, MD;  Location: [facility];  Service: General;  Laterality: N/A;  . REDUCTION MAMMAPLASTY Bilateral 2017  . TUBAL LIGATION    . WISDOM TOOTH EXTRACTION      Social History   Tobacco Use  . Smoking status: Never Smoker  . Smokeless tobacco: Never Used  Substance Use Topics  . Alcohol use: Not Currently    Family History  Problem Relation Age of Onset  . Cancer Mother        late 46'-50  . Breast cancer Mother        late 53'-50  . Stroke Father   . Diabetes Brother   . Seizures Son   . Breast cancer Maternal Aunt        late 40'-50  . Breast cancer Maternal Aunt        late 40'-50  . Breast cancer Maternal Aunt        late 40'-50  .  Migraines Neg Hx     ROS Per hpi  OBJECTIVE:  Today's Vitals   09/22/18 0827  BP: 125/86  Pulse: 89  Temp: 98.8 F (37.1 C)  TempSrc: Oral  SpO2: 100%  Weight: 194 lb (88 kg)  Height: 5\' 8"  (1.727 m)   Body mass index is 29.5 kg/m.   Physical Exam Vitals signs and nursing note reviewed.  Constitutional:      Appearance: She is well-developed.  HENT:     Head: Normocephalic and atraumatic.     Mouth/Throat:     Pharynx: No oropharyngeal exudate.  Eyes:     General: No scleral icterus.    Conjunctiva/sclera: Conjunctivae normal.     Pupils: Pupils are equal, round, and reactive to light.  Neck:     Musculoskeletal: Neck supple.  Cardiovascular:     Rate and Rhythm: Normal rate and regular rhythm.     Heart sounds: Normal heart sounds. No murmur. No friction rub. No gallop.   Pulmonary:     Effort: Pulmonary effort is normal.     Breath sounds: Normal breath sounds. No wheezing or rales.  Abdominal:     Comments: g tube in place  Skin:    General: Skin is warm and dry.  Neurological:     Mental Status: She is alert and oriented to person, place, and time.     Results for orders placed or performed in visit on 09/22/18 (from the past 24 hour(s))  POCT urinalysis dipstick     Status: Abnormal   Collection Time: 09/22/18  8:31 AM  Result Value  Ref Range   Color, UA yellow yellow   Clarity, UA cloudy (A) clear   Glucose, UA negative negative mg/dL   Bilirubin, UA negative negative   Ketones, POC UA negative negative mg/dL   Spec Grav, UA 1.015 1.010 - 1.025   Blood, UA negative negative   pH, UA 8.5 (A) 5.0 - 8.0   Protein Ur, POC =100 (A) negative mg/dL   Urobilinogen, UA 1.0 0.2 or 1.0 E.U./dL   Nitrite, UA Negative Negative   Leukocytes, UA Negative Negative  POCT Microscopic Urinalysis ([facility])     Status: Abnormal   Collection Time: 09/22/18  8:38 AM  Result Value Ref Range   WBC,UR,HPF,POC Few (A) None WBC/hpf   RBC,UR,HPF,POC None None RBC/hpf    Bacteria Too numerous to count  None, Too numerous to count   Mucus Absent Absent   Epithelial Cells, UR Per Microscopy Few (A) None, Too numerous to count cells/hpf     ASSESSMENT and PLAN  1. Acute cystitis without hematuria Discussed supportive measures, new meds r/se/b and RTC precautions.   2. 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 Adjustment disorder with mixed anxiety and depressed mood - Ambulatory referral to Psychiatry  5. S/P gastric bypass 6. G tube feedings (HCC) 7. Abdominal pain, chronic, epigastric 8. IIH (idiopathic intracranial hypertension) 9. Chronic fatigue  Forms for long term disability completed.  Other orders - nitrofurantoin, macrocrystal-monohydrate, (MACROBID) 100 MG capsule; Take 1 capsule (100 mg total) by mouth 2 (two) times daily. - fluconazole (DIFLUCAN) 150 MG tablet; Take 1 tablet (150 mg total) by mouth once for 1 dose.  No follow-ups on file.    Rutherford Guys, MD Primary [facility] at [facility] [place], [place] [address] Ph.  [phone] Fax [phone]

## 2018-09-23 NOTE — Telephone Encounter (Signed)
I have faxed his info off to [facility] and I got a confirmation.

## 2018-09-24 LAB — URINE CULTURE

## 2018-10-06 ENCOUNTER — Encounter: Payer: Self-pay | Admitting: Family Medicine

## 2018-10-06 ENCOUNTER — Encounter: Payer: Self-pay | Admitting: *Deleted

## 2018-10-06 ENCOUNTER — Telehealth: Payer: Self-pay | Admitting: Family Medicine

## 2018-10-06 ENCOUNTER — Other Ambulatory Visit: Payer: Self-pay

## 2018-10-06 ENCOUNTER — Ambulatory Visit (INDEPENDENT_AMBULATORY_CARE_PROVIDER_SITE_OTHER): Payer: Medicare Other | Admitting: Family Medicine

## 2018-10-06 VITALS — BP 148/103 | HR 92 | Temp 97.8°F | Ht 68.0 in | Wt 198.0 lb

## 2018-10-06 DIAGNOSIS — G43019 Migraine without aura, intractable, without status migrainosus: Secondary | ICD-10-CM

## 2018-10-06 DIAGNOSIS — H57052 Tonic pupil, left eye: Secondary | ICD-10-CM

## 2018-10-06 DIAGNOSIS — G932 Benign intracranial hypertension: Secondary | ICD-10-CM

## 2018-10-06 MED ORDER — TOPIRAMATE 100 MG PO TABS: 150.0000 mg | ORAL_TABLET | Freq: Every day | ORAL | 2 refills | Status: DC

## 2018-10-06 MED FILL — TOPIRAMATE 100 MG TABLET: 100 | 20 days supply | Qty: 30 | Fill #0

## 2018-10-06 NOTE — Telephone Encounter (Signed)
PATIENT HUSBANDS HAS BEEN EXPOSED TO COVID AT WORK AND PATIENT WOULD LIKE TO GET TESTED AS WELL /

## 2018-10-06 NOTE — Progress Notes (Signed)
PATIENT: Margaret Fletcher DOB: 1970-11-18  REASON FOR VISIT: follow up HISTORY FROM: patient  Chief Complaint  Patient presents with   Follow-up    Migraine f/u. Alone. Rm 9. Patient mentioned that she currently has a headache. She stated that this day 3 of this headache. She also mentioned that she had a bad headache last week.      HISTORY OF PRESENT ILLNESS: Today 10/06/18 Margaret Fletcher is a 48 y.o. female here today for follow up for migraines. She has not been seen since 02/2017 due to loss of insurance coverage. She has recently been approved for disability.   She has had increased frequency of headaches and migraines. She has history of IIH and Adie's eye. Headaches are daily with migraines occurring 2-3 times a week. She has noted a runny nose with headaches and feels pressure behind her eyes. She increased her dose of topiramate from 75mg  to 100mg  "a while ago." Last RX written from Dr Pamella Pert was for 45 tablets and 2 refills. She reports that she would get refills early. She is also taking amitriptyline 20mg . She is using a full rx of Relpax every month, sometimes in the first three weeks. Last rx written 03/23/2018 for 10 tablets with 2 refills. She very rarely uses Excedrin, only if she has to. She reports recent eye exam was normal with no concerns of optic nerve swelling.   She is s/p bypass surgery. 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 She is taking buprenorphine 342mcg daily. She is taking clonazepam 0.5mg  daily. Pristiq for depression.   She does not feel her BP has been elevated in the past. She notes elevation today but feels it is related to pain. She has noted dizziness and vertiginous symptoms. Dizziness sometimes when standing up. She reports being well hydrated. She reports concerns of hypoglycemia in absence of diagnosis of diabetes. Readings fluctuate  between 50-160.    HISTORY: (copied from [place] note on 02/22/2017)  Today 02/22/17] Margaret Fletcher is a 48 year old female with a history of Adie's eye and migraine headaches.  She returns today for follow-up.  She states in November she developed a severe headache.  She states that it was accompanied by heaviness in the right arm and in the legs.  She states that she had trouble holding things in the right hand.  She went to the emergency room and diagnosed with a complex migraine.  She reports that once her headache was better the other symptoms improved as well.  She states that since her visit to the ED in November she has essentially has had a headache daily.  She reports that some days her headaches are worse than others.  Had a sleep study in the past that did not show sleep apnea.  She is currently on Topamax 50 mg at bedtime and amitriptyline 20 mg at bedtime.  She reports that her primary care recently put her on Pristiq for depression.  She continues to use Relpax for her headaches.  In the past the patient's topamax dose was decreased due to worsening fatigue, depression and memory problems.  She does feel that since her Topamax dose has been decreased her headaches have gotten worse.  She remains on amitriptyline 20 mg at bedtime.  She does report that it makes her feel very groggy the next morning.  She returns today for an evaluation.  HISTORY 09/14/16: Margaret Fletcher a 48 y.o.femalehere as a referral from Dr. Brain Hilts dilated pupil. She was diagnosed with Adie's eye. She had a headache, some headache, ear congestion and then noticed that her pupil was dilated. Her eye gets sore even now. She has lost 120 pounds. Her headaches are better. It is hard to eat. She was denied for disability. Itis in appeal. She applied for medicaid. She wears sunglasses. She is having blurry vision in the eye. The left eye is blurry. She was evaluated by ophthalmology. Going outside is hard. No  improvement. She takes 150mg  Topiramate daily. She is still having headaches every day, she takes eletriptan, She takes the eletriptan when the headaches are migrainous. She has daily headaches that last all day long. She is under a lot of stress, headaches better when it is dark, the headache feels like eye strain.  Reviewed notes, labs and imaging from outside physicians, which showed:    CT headshowed No acute intracranial abnormalities including mass lesion or mass effect, hydrocephalus, extra-axial fluid collection, midline shift, hemorrhage, or acute infarction, large ischemic events (personally reviewed images)   hgba1c 5.3, RPR NR    REVIEW OF SYSTEMS: Out of a complete 14 system review of symptoms, the patient complains only of the following symptoms, numbness, memory loss, dizziness, headaches, feeling like she is going to pass out but doesn't, runny nose, and all other reviewed systems are negative.   ALLERGIES: Allergies  Allergen Reactions   Sulfa Antibiotics Anaphylaxis   Metoclopramide Other (See Comments)    Elevated prolactin >5x ULN induction lactation when taking scheduled for several months   Zosyn [Piperacillin Sod-Tazobactam So] Rash    Has patient had a PCN reaction causing immediate rash, facial/tongue/throat swelling, SOB or lightheadedness with hypotension: No Has patient had a PCN reaction causing severe rash involving mucus membranes or skin necrosis: No Has patient had a PCN reaction that required hospitalization: Unknown--inpatient when reaction occurred Has patient had a PCN reaction occurring within the last 10 years: Yes If all of the above answers are "NO", then may proceed with Cephalosporin use.     HOME MEDICATIONS: Outpatient Medications Prior to Visit  Medication Sig Dispense Refill   albuterol (PROVENTIL HFA;VENTOLIN HFA) 108 (90 Base) MCG/ACT inhaler Inhale 2 puffs into the lungs every 6 (six) hours as needed for wheezing or  shortness of breath. 1 Inhaler 0   amitriptyline (ELAVIL) 10 MG tablet Take 2 tablets (20 mg total) by mouth at bedtime. 60 tablet 2   amphetamine-dextroamphetamine (ADDERALL) 15 MG tablet Take 1 tablet by mouth 2 (two) times daily with a meal. 60 tablet 0   BAYER MICROLET LANCETS lancets Use as instructed 100 each 12   Buprenorphine HCl 300 MCG FILM Place 300 mcg inside cheek every 12 (twelve) hours. Can also take for breakthrough pain per patient     CALCIUM-VITAMIN D PO Take 1 tablet by mouth 2 (two) times daily. chewable     clonazePAM (KLONOPIN) 0.5 MG tablet Take 1 tablet (0.5 mg total) by mouth daily as needed for anxiety. 30 tablet 2   Cyanocobalamin (VITAMIN B-12 SL) Place 1 drop under the tongue daily. 1 dropperful     cyclobenzaprine (FLEXERIL) 5 MG tablet TAKE 1 TABLET BY MOUTH 3 TIMES DAILY AS NEEDED FOR MUSCLE SPASMS. MAY TAKE 2 TABS BY MOUTH AT BEDTIME 40 tablet 2   desvenlafaxine (PRISTIQ) 100 MG 24 hr tablet Take 1 tablet (100 mg total) by mouth daily. 90 tablet 0   docusate sodium (  COLACE) 100 MG capsule Take 100 mg by mouth daily.     eletriptan (RELPAX) 40 MG tablet Take 1 tablet (40 mg total) by mouth every 2 (two) hours as needed for migraine or headache. Do not use >2 doses/24 hours 10 tablet 2   glucose blood (CONTOUR NEXT TEST) test strip 1 each by Other route 4 (four) times daily as needed (hypoglycemia). K91.2 270 each 5   lactulose (CHRONULAC) 10 GM/15ML solution Take 30 g by mouth daily as needed for moderate constipation.   0   magic mouthwash w/lidocaine SOLN Take 5 mLs by mouth 4 (four) times daily as needed for mouth pain. 120 mL 0   Multiple Vitamins-Minerals (BARIATRIC MULTIVITAMINS/IRON PO) Take 1 tablet by mouth 3 (three) times daily.     nitrofurantoin, macrocrystal-monohydrate, (MACROBID) 100 MG capsule Take 1 capsule (100 mg total) by mouth 2 (two) times daily. 20 capsule 0   Nutritional Supplements (FEEDING SUPPLEMENT, VITAL HIGH PROTEIN,)  LIQD liquid Place 1,000 mLs into feeding tube daily. 6ml/hr for 8hrs a day as needed for poor oral intake; 300 ml free water q4hr (Patient taking differently: Place 1,000 mLs into feeding tube daily. 66ml/hr for 8hrs a day as needed for poor oral intake; 300 ml free water q4hr) 1000 mL 8   nystatin (MYCOSTATIN) 100000 UNIT/ML suspension 5 ml po qid. Swish in mouth and gargle as long as possible then swallowg. Stop 48hrs AFTER symptoms gone 280 mL 0   ondansetron (ZOFRAN ODT) 4 MG disintegrating tablet Take 1 tablet (4 mg total) by mouth every 8 (eight) hours as needed for nausea or vomiting. 20 tablet 0   polyethylene glycol (MIRALAX / GLYCOLAX) packet Take 17 g by mouth daily as needed (for constipation.).      simethicone (MYLICON) 80 MG chewable tablet Chew 80 mg by mouth every 6 (six) hours as needed for flatulence.     Vitamin D, Ergocalciferol, (DRISDOL) 1.25 MG (50000 UT) CAPS capsule Take 1 capsule by mouth daily.     topiramate (TOPAMAX) 50 MG tablet Take 1.5 tablets (75 mg total) by mouth daily. 45 tablet 2   omeprazole (PRILOSEC) 20 MG capsule Take 1 capsule (20 mg total) by mouth 2 (two) times daily before a meal for 14 days. 28 capsule 0   amphetamine-dextroamphetamine (ADDERALL) 15 MG tablet Take 1 tablet by mouth 2 (two) times daily for 30 days. 60 tablet 0   amphetamine-dextroamphetamine (ADDERALL) 15 MG tablet Take 1 tablet by mouth 2 (two) times daily for 30 days. 60 tablet 0   amphetamine-dextroamphetamine (ADDERALL) 15 MG tablet Take 1 tablet by mouth 2 (two) times daily for 30 days. 60 tablet 0   oxyCODONE (OXY IR/ROXICODONE) 5 MG immediate release tablet Take 1-2 tablets (5-10 mg total) by mouth every 6 (six) hours as needed for severe pain. (Patient taking differently: Take 2.5 mg by mouth every 6 (six) hours as needed for severe pain. ) 120 tablet 0   No facility-administered medications prior to visit.     PAST MEDICAL HISTORY: Past Medical History:  Diagnosis  Date   Alopecia    Anxiety    Autoimmune disease ([place])    "Alopecia"   Bacterial vaginosis    Cyst of spleen    Depression    Frequent UTI    Hypertrophy of breast    IIH (idiopathic intracranial hypertension) 2017   "related to headaches"   Migraine headache    denies, ruled out - Propranolol.Using for headaches    PAST  SURGICAL HISTORY: Past Surgical History:  Procedure Laterality Date   APPENDECTOMY     BREAST EXCISIONAL BIOPSY Right 2017   removed at time of reduction   BREAST REDUCTION SURGERY Bilateral 03/26/2015   Procedure: BILATERAL BREAST REDUCTION  ;  Surgeon: Youlanda Roys, MD;  Location: [facility];  Service: Plastics;  Laterality: Bilateral;   BREATH TEK H PYLORI N/A 05/21/2014   Procedure: BREATH TEK H PYLORI;  Surgeon: Greer Pickerel, MD;  Location: [facility];  Service: General;  Laterality: N/A;   CELIAC PLEXUS BLOCK  07/18/2018   Per patient it was done at [place] N/A 07/19/2012   Procedure: [place];  Surgeon: Farrel Gobble. Harrington Challenger, MD;  Location: [facility];  Service: Gynecology;  Laterality: N/A;   ENDOMETRIAL ABLATION     ESOPHAGOGASTRODUODENOSCOPY N/A 08/16/2015   Procedure: UPPER ESOPHAGOGASTRODUODENOSCOPY (EGD);  Surgeon: Greer Pickerel, MD;  Location: [facility];  Service: General;  Laterality: N/A;   ESOPHAGOGASTRODUODENOSCOPY N/A 02/21/2016   Procedure: ESOPHAGOGASTRODUODENOSCOPY (EGD);  Surgeon: Greer Pickerel, MD;  Location: [facility];  Service: General;  Laterality: N/A;   ESOPHAGOGASTRODUODENOSCOPY N/A 02/24/2017   Procedure: ESOPHAGOGASTRODUODENOSCOPY (EGD);  Surgeon: Greer Pickerel, MD;  Location: [facility];  Service: General;  Laterality: N/A;   IR GENERIC HISTORICAL  10/04/2015   IR [place] TUBE Eber Jones W/FLUORO 10/04/2015 Markus Daft, MD [facility]   IR GENERIC HISTORICAL  01/31/2016   IR [place] TUBE PERCUT W/FLUORO 01/31/2016 Corrie Mckusick, DO [facility]   IR GENERIC HISTORICAL  04/06/2016   IR [place] TUBE Eber Jones W/FLUORO 04/06/2016 Arne Cleveland, MD [facility]   IR GENERIC HISTORICAL  05/14/2016   IR GASTRIC TUBE PERC CHG W/O IMG GUIDE 05/14/2016 Sandi Mariscal, MD [facility]   LAPAROSCOPIC GASTROSTOMY N/A 09/30/2015   Procedure: LAPAROSCOPIC GASTROSTOMY TUBE PLACEMENT;  Surgeon: Greer Pickerel, MD;  Location: [facility];  Service: General;  Laterality: N/A;   LAPAROSCOPIC ROUX-EN-Y GASTRIC BYPASS WITH HIATAL HERNIA REPAIR N/A 07/08/2015   Procedure: LAPAROSCOPIC ROUX-EN-Y GASTRIC BYPASS WITH HIATAL HERNIA REPAIR WITH UPPER ENDOSCOPY;  Surgeon: Greer Pickerel, MD;  Location: [facility];  Service: General;  Laterality: N/A;   LAPAROSCOPIC SMALL BOWEL RESECTION N/A 09/30/2015   Procedure: LAPAROSCOPIC REVISION OF ROUX LIMB;  Surgeon: Greer Pickerel, MD;  Location: [facility];  Service: General;  Laterality: N/A;   LAPAROSCOPY N/A 09/30/2015   Procedure: LAPAROSCOPY DIAGNOSTIC;  Surgeon: Greer Pickerel, MD;  Location: [facility];  Service: General;  Laterality: N/A;   REDUCTION MAMMAPLASTY Bilateral 2017   TUBAL LIGATION     WISDOM TOOTH EXTRACTION      FAMILY HISTORY: Family History  Problem Relation Age of Onset   Cancer Mother        late 90'-50   Breast cancer Mother        late 15'-50   Stroke Father    Diabetes Brother    Seizures Son    Breast cancer Maternal Aunt        late 40'-50   Breast cancer Maternal Aunt        late 40'-50   Breast cancer Maternal Aunt        late 40'-50   Migraines Neg Hx     SOCIAL HISTORY: Social History   Socioeconomic History   Marital status: Divorced    Spouse name: Not on  file   Number of children: 2   Years of education: 16   Highest education level: Not on file  Occupational History   Occupation: [employer]  resource strain: Not on file   Food insecurity    Worry: Not on file    Inability: Not on file   Transportation needs    Medical: Not on file    Non-medical: Not on file  Tobacco Use   Smoking status: Never Smoker   Smokeless tobacco: Never Used  Substance and Sexual Activity   Alcohol use: Not Currently   Drug use: No   Sexual activity: Yes    Birth control/protection: Surgical  Lifestyle   Physical activity    Days per week: Not on file    Minutes per session: Not on file   Stress: Not on file  Relationships   Social connections    Talks on phone: Not on file    Gets together: Not on file    Attends religious service: Not on file    Active member of club or organization: Not on file    Attends meetings of clubs or organizations: Not on file    Relationship status: Not on file   Intimate partner violence    Fear of current or ex partner: Not on file    Emotionally abused: Not on file    Physically abused: Not on file    Forced sexual activity: Not on file  Other Topics Concern   Not on file  Social History Narrative   Lives with partner   Caffeine use: minimal coffee   Right-handed      PHYSICAL EXAM  Vitals:   10/06/18 0841  BP: (!) 148/103  Pulse: 92  Temp: 97.8 F (36.6 C)  TempSrc: Oral  Weight: 198 lb (89.8 kg)  Height: 5\' 8"  (1.727 m)   Body mass index is 30.11 kg/m.  Generalized: Well developed, in no acute distress  Cardiology: normal rate and rhythm, no murmur noted Neurological examination  Mentation: Alert oriented to time, place, history taking. Follows all commands speech and language fluent Cranial nerve II-XII: Pupils were equal round reactive to light. Extraocular movements were full, visual field were full on confrontational test. Facial sensation and strength were normal. Uvula tongue midline. Head turning and shoulder shrug  were normal and symmetric. Motor: The motor testing reveals 5 over 5 strength of all 4 extremities.  Good symmetric motor tone is noted throughout.  Sensory: Sensory testing is intact to soft touch on all 4 extremities. No evidence of extinction is noted.  Coordination: Cerebellar testing reveals good finger-nose-finger and heel-to-shin bilaterally.  Gait and station: Gait is normal. Tandem gait is normal. Romberg is negative. No drift is seen.  Reflexes: Deep tendon reflexes are symmetric and normal bilaterally.   DIAGNOSTIC DATA (LABS, IMAGING, TESTING) - I reviewed patient records, labs, notes, testing and imaging myself where available.  No flowsheet data found.   Lab Results  Component Value Date   WBC 7.8 03/22/2018   HGB 11.6 (L) 03/22/2018   HCT 37.1 03/22/2018   MCV 98.9 03/22/2018   PLT 273 03/22/2018      Component Value Date/Time   NA 137 04/26/2018 1642   K 4.3 04/26/2018 1642   CL 104 04/26/2018 1642   CO2 19 (L) 04/26/2018 1642   GLUCOSE 72 04/26/2018 1642   GLUCOSE 86 03/22/2018 0026   BUN 19 04/26/2018 1642   CREATININE 1.17 (  H) 04/26/2018 1642   CREATININE 0.95 11/15/2015 1712   CALCIUM 8.3 (L) 04/26/2018 1642   PROT 6.3 04/26/2018 1642   ALBUMIN 3.6 (L) 04/26/2018 1642   AST 23 04/26/2018 1642   ALT 15 04/26/2018 1642   ALKPHOS 61 04/26/2018 1642   BILITOT <0.2 04/26/2018 1642   GFRNONAA 55 (L) 04/26/2018 1642   GFRNONAA 73 11/15/2015 1712   GFRAA 64 04/26/2018 1642   GFRAA 84 11/15/2015 1712   No results found for: CHOL, HDL, LDLCALC, LDLDIRECT, TRIG, CHOLHDL Lab Results  Component Value Date   HGBA1C 5.3 03/16/2016   Lab Results  Component Value Date   VITAMINB12 1,551 (H) 03/14/2018   Lab Results  Component Value Date   TSH 1.210 04/28/2017    ASSESSMENT AND PLAN 48 y.o. year old female  has a past medical history of Alopecia, Anxiety, Autoimmune disease ([place]), Bacterial vaginosis, Cyst of spleen, Depression, Frequent UTI, Hypertrophy of breast, IIH (idiopathic intracranial hypertension) (2017), and Migraine headache. here with      ICD-10-CM   1. IIH (idiopathic intracranial hypertension)  G93.2 topiramate (TOPAMAX) 100 MG tablet  2. 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 Intractable migraine without aura and without status migrainosus  G43.019 topiramate (TOPAMAX) 100 MG tablet    I am concerned that multiple factors are contributing to her headaches/migraines. She describes symptoms of possible allergies, potential blood pressure elevation, hypoglycemia and she is on daily opioid/stimulant and benzodiazepine therapy. We have discussed these concerns. I have advised that she monitor BP at home and call PCP for consistent readings greater than 140/90. She reports feeling better on increased dose of topiramate in the past (200mg  twice daily). Dose was decreased due to mental fog and depression. We will increase topiramate to 150mg  nightly. I have suggested slow increase to ensure tolerability. We will consider additional preventatives as necessary. She will continue amitriptyline and Relpax as prescribed. Healthy lifestyle with well balanced diet and regular exercise advised. She will follow up in 3 months. She verbalizes understanding and agreement with plan.    No orders of the defined types were placed in this encounter.    Meds ordered this encounter  Medications   topiramate (TOPAMAX) 100 MG tablet    Sig: Take 1.5 tablets (150 mg total) by mouth daily.    Dispense:  30 tablet    Refill:  2    Order Specific Question:   Supervising Provider    Answer:   Melvenia Beam [id number]      I spent 25 minutes with the patient. 50% of this time was spent counseling and educating patient on plan of care and medications.     Debbora Presto, FNP-C 10/06/2018, 9:56 AM [facility] [address], [place], [place] [address] [phone]

## 2018-10-06 NOTE — Patient Instructions (Addendum)
We will increase topiramate to 150mg  every night  Continue amitriptyline and Relpax  Check BP at home and call PCP for elevated readings > 140/90  Follow up in 3 months    Migraine Headache A migraine headache is a very strong throbbing pain on one side or both sides of your head. This type of headache can also cause other symptoms. It can last from 4 hours to 3 days. Talk with your doctor about what things may bring on (trigger) this condition. What are the causes? The exact cause of this condition is not known. This condition may be triggered or caused by:  Drinking alcohol.  Smoking.  Taking medicines, such as: ? Medicine used to treat chest pain (nitroglycerin). ? Birth control pills. ? Estrogen. ? Some blood pressure medicines.  Eating or drinking certain products.  Doing physical activity. Other things that may trigger a migraine headache include:  Having a menstrual period.  Pregnancy.  Hunger.  Stress.  Not getting enough sleep or getting too much sleep.  Weather changes.  Tiredness (fatigue). What increases the risk?  Being 74-67 years old.  Being female.  Having a family history of migraine headaches.  Being Caucasian.  Having depression or anxiety.  Being very overweight. What are the signs or symptoms?  A throbbing pain. This pain may: ? Happen in any area of the head, such as on one side or both sides. ? Make it hard to do daily activities. ? Get worse with physical activity. ? Get worse around bright lights or loud noises.  Other symptoms may include: ? Feeling sick to your stomach (nauseous). ? Vomiting. ? Dizziness. ? Being sensitive to bright lights, loud noises, or smells.  Before you get a migraine headache, you may get warning signs (an aura). An aura may include: ? Seeing flashing lights or having blind spots. ? Seeing bright spots, halos, or zigzag lines. ? Having tunnel vision or blurred vision. ? Having numbness or a  tingling feeling. ? Having trouble talking. ? Having weak muscles.  Some people have symptoms after a migraine headache (postdromal phase), such as: ? Tiredness. ? Trouble thinking (concentrating). How is this treated?  Taking medicines that: ? Relieve pain. ? Relieve the feeling of being sick to your stomach. ? Prevent migraine headaches.  Treatment may also include: ? Having acupuncture. ? Avoiding foods that bring on migraine headaches. ? Learning ways to control your body functions (biofeedback). ? Therapy to help you know and deal with negative thoughts (cognitive behavioral therapy). Follow these instructions at home: Medicines  Take over-the-counter and prescription medicines only as told by your doctor.  Ask your doctor if the medicine prescribed to you: ? Requires you to avoid driving or using heavy machinery. ? Can cause trouble pooping (constipation). You may need to take these steps to prevent or treat trouble pooping:  Drink enough fluid to keep your pee (urine) pale yellow.  Take over-the-counter or prescription medicines.  Eat foods that are high in fiber. These include beans, whole grains, and fresh fruits and vegetables.  Limit foods that are high in fat and sugar. These include fried or sweet foods. Lifestyle  Do not drink alcohol.  Do not use any products that contain nicotine or tobacco, such as cigarettes, e-cigarettes, and chewing tobacco. If you need help quitting, ask your doctor.  Get at least 8 hours of sleep every night.  Limit and deal with stress. General instructions      Keep a journal to find  out what may bring on your migraine headaches. For example, write down: ? What you eat and drink. ? How much sleep you get. ? Any change in what you eat or drink. ? Any change in your medicines.  If you have a migraine headache: ? Avoid things that make your symptoms worse, such as bright lights. ? It may help to lie down in a dark, quiet  room. ? Do not drive or use heavy machinery. ? Ask your doctor what activities are safe for you.  Keep all follow-up visits as told by your doctor. This is important. Contact a doctor if:  You get a migraine headache that is different or worse than others you have had.  You have more than 15 headache days in one month. Get help right away if:  Your migraine headache gets very bad.  Your migraine headache lasts longer than 72 hours.  You have a fever.  You have a stiff neck.  You have trouble seeing.  Your muscles feel weak or like you cannot control them.  You start to lose your balance a lot.  You start to have trouble walking.  You pass out (faint).  You have a seizure. Summary  A migraine headache is a very strong throbbing pain on one side or both sides of your head. These headaches can also cause other symptoms.  This condition may be treated with medicines and changes to your lifestyle.  Keep a journal to find out what may bring on your migraine headaches.  Contact a doctor if you get a migraine headache that is different or worse than others you have had.  Contact your doctor if you have more than 15 headache days in a month. This information is not intended to replace advice given to you by your health care provider. Make sure you discuss any questions you have with your health care provider. Document Released: 12/03/2007 Document Revised: 06/17/2018 Document Reviewed: 04/07/2018 Elsevier Patient Education  [place].    Topiramate extended-release capsules What is this medicine? TOPIRAMATE (toe PYRE a mate) is used to treat seizures in adults or children with epilepsy. It is also used for the prevention of migraine headaches. This medicine may be used for other purposes; ask your health care provider or pharmacist if you have questions. COMMON BRAND NAME(S): Trokendi XR What should I tell my health care provider before I take this medicine? They  need to know if you have any of these conditions:  cirrhosis of the liver or liver disease  diarrhea  glaucoma  kidney stones or kidney disease  lung disease like asthma, obstructive pulmonary disease, emphysema  metabolic acidosis  on a ketogenic diet  scheduled for surgery or a procedure  suicidal thoughts, plans, or attempt; a previous suicide attempt by you or a family member  an unusual or allergic reaction to topiramate, other medicines, foods, dyes, or preservatives  pregnant or trying to get pregnant  breast-feeding How should I use this medicine? Take this medicine by mouth with a glass of water. Follow the directions on the prescription label. Trokendi XR capsules must be swallowed whole. Do not sprinkle on food, break, crush, dissolve, or chew. Qudexy XR capsules may be swallowed whole or opened and sprinkled on a small amount of soft food. This mixture must be swallowed immediately. Do not chew or store mixture for later use. You may take this medicine with meals. Take your medicine at regular intervals. Do not take it more often than  directed. Talk to your pediatrician regarding the use of this medicine in children. Special care may be needed. While Trokendi XR may be prescribed for children as young as 6 years and Qudexy XR may be prescribed for children as young as 2 years for selected conditions, precautions do apply. Overdosage: If you think you have taken too much of this medicine contact a poison control center or emergency room at once. NOTE: This medicine is only for you. Do not share this medicine with others. What if I miss a dose? If you miss a dose, take it as soon as you can. If it is almost time for your next dose, take only that dose. Do not take double or extra doses. What may interact with this medicine? Do not take this medicine with any of the following medications:  probenecid This medicine may also interact with the following medications:   acetazolamide  alcohol  amitriptyline  birth control pills  digoxin  hydrochlorothiazide  lithium  medicines for pain, sleep, or muscle relaxation  metformin  methazolamide  other seizure or epilepsy medicines  pioglitazone  risperidone This list may not describe all possible interactions. Give your health care provider a list of all the medicines, herbs, non-prescription drugs, or dietary supplements you use. Also tell them if you smoke, drink alcohol, or use illegal drugs. Some items may interact with your medicine. What should I watch for while using this medicine? Visit your doctor or health care professional for regular checks on your progress. Do not stop taking this medicine suddenly. This increases the risk of seizures if you are using this medicine to control epilepsy. Wear a medical identification bracelet or chain to say you have epilepsy or seizures, and carry a card that lists all your medicines. This medicine can decrease sweating and increase your body temperature. Watch for signs of deceased sweating or fever, especially in children. Avoid extreme heat, hot baths, and saunas. Be careful about exercising, especially in hot weather. Contact your health care provider right away if you notice a fever or decrease in sweating. You should drink plenty of fluids while taking this medicine. If you have had kidney stones in the past, this will help to reduce your chances of forming kidney stones. If you have stomach pain, with nausea or vomiting and yellowing of your eyes or skin, call your doctor immediately. You may get drowsy, dizzy, or have blurred vision. Do not drive, use machinery, or do anything that needs mental alertness until you know how this medicine affects you. To reduce dizziness, do not sit or stand up quickly, especially if you are an older patient. Alcohol can increase drowsiness and dizziness. Avoid alcoholic drinks. Do not drink alcohol for 6 hours before or  6 hours after taking Trokendi XR. If you notice blurred vision, eye pain, or other eye problems, seek medical attention at once for an eye exam. The use of this medicine may increase the chance of suicidal thoughts or actions. Pay special attention to how you are responding while on this medicine. Any worsening of mood, or thoughts of suicide or dying should be reported to your health care professional right away. This medicine may increase the chance of developing metabolic acidosis. If left untreated, this can cause kidney stones, bone disease, or slowed growth in children. Symptoms include breathing fast, fatigue, loss of appetite, irregular heartbeat, or loss of consciousness. Call your doctor immediately if you experience any of these side effects. Also, tell your doctor about any  surgery you plan on having while taking this medicine since this may increase your risk for metabolic acidosis. Birth control pills may not work properly while you are taking this medicine. Talk to your doctor about using an extra method of birth control. Women who become pregnant while using this medicine may enroll in the [place] Pregnancy Registry by calling [phone]. This registry collects information about the safety of antiepileptic drug use during pregnancy. What side effects may I notice from receiving this medicine? Side effects that you should report to your doctor or health care professional as soon as possible:  allergic reactions like skin rash, itching or hives, swelling of the face, lips, or tongue  decreased sweating and/or rise in body temperature  depression  difficulty breathing, fast or irregular breathing patterns  difficulty speaking  difficulty walking or controlling muscle movements  hearing impairment  redness, blistering, peeling or loosening of the skin, including inside the mouth  tingling, pain or numbness in the hands or feet  unusually weak or  tired  worsening of mood, thoughts or actions of suicide or dying Side effects that usually do not require medical attention (report to your doctor or health care professional if they continue or are bothersome):  altered taste  back pain, joint or muscle aches and pains  diarrhea, or constipation  headache  loss of appetite  nausea  stomach upset, indigestion  tremors This list may not describe all possible side effects. Call your doctor for medical advice about side effects. You may report side effects to FDA at 1-800-FDA-1088. Where should I keep my medicine? Keep out of the reach of children. Store at room temperature between 15 and 30 degrees C (59 and 86 degrees F) in a tightly closed container. Protect from moisture. Throw away any unused medicine after the expiration date. NOTE: This sheet is a summary. It may not cover all possible information. If you have questions about this medicine, talk to your doctor, pharmacist, or health care provider.  2020 Elsevier/Gold Standard (2015-06-14 12:33:11)

## 2018-10-07 NOTE — Progress Notes (Signed)
Made any corrections needed, and agree with history, physical, neuro exam,assessment and plan as stated.     Antonia Ahern, MD [facility]  

## 2018-10-11 ENCOUNTER — Other Ambulatory Visit: Payer: Self-pay | Admitting: Family Medicine

## 2018-10-11 MED FILL — BELBUCA 450 MCG FILM: 450 | 30 days supply | Qty: 60 | Fill #0

## 2018-10-11 MED FILL — AMITRIPTYLINE HCL 10 MG TAB: 10 | 30 days supply | Qty: 60 | Fill #0

## 2018-10-11 MED FILL — XULANE PATCH: 150-35 | 21 days supply | Qty: 3 | Fill #1

## 2018-10-11 MED FILL — clonazePAM 0.5 MG TABS: 0.5 | 30 days supply | Qty: 30 | Fill #2

## 2018-10-11 MED FILL — CYCLOBENZAPRINE HCL 5 MG TA: 5 | 30 days supply | Qty: 30 | Fill #0

## 2018-10-11 NOTE — Telephone Encounter (Signed)
Requested medications are due for refill today?  Yes  Requested medications are on the active medication list?  Yes  Last refill 02/24/2018  Future visit scheduled?  Yes -10/14/2018  Notes to clinic   Requested Prescriptions  Pending Prescriptions Disp Refills   amphetamine-dextroamphetamine (ADDERALL) 15 MG tablet [Pharmacy Med Name: AMPHETAMINE-DEXTROAMPHETAMI 15 TAB] 60 tablet 0    Sig: TAKE 1 TABLET BY MOUTH 2 TIMES DAILY FOR 30 DAYS.(DNF 08/17/18)     There is no refill protocol information for this order    Signed Prescriptions Disp Refills   amitriptyline (ELAVIL) 10 MG tablet 60 tablet 2    Sig: TAKE 2 TABLETS BY MOUTH AT BEDTIME.     There is no refill protocol information for this order

## 2018-10-12 ENCOUNTER — Other Ambulatory Visit: Payer: Self-pay

## 2018-10-12 ENCOUNTER — Encounter: Payer: Self-pay | Admitting: Emergency Medicine

## 2018-10-12 ENCOUNTER — Telehealth (INDEPENDENT_AMBULATORY_CARE_PROVIDER_SITE_OTHER): Payer: Medicare Other | Admitting: Emergency Medicine

## 2018-10-12 DIAGNOSIS — Z20828 Contact with and (suspected) exposure to other viral communicable diseases: Secondary | ICD-10-CM | POA: Diagnosis not present

## 2018-10-12 DIAGNOSIS — Z20822 Contact with and (suspected) exposure to covid-19: Secondary | ICD-10-CM

## 2018-10-12 NOTE — Progress Notes (Signed)
Pt is wanting to get tested for covid because her husband was recently exposed. Neither are showing symptoms for the virus. She wants a test for reassurance.

## 2018-10-12 NOTE — Telephone Encounter (Signed)
Pt requesting medication. This med was last given by Dr. Brigitte Pulse. Pt has appt 8/7

## 2018-10-12 NOTE — Progress Notes (Signed)
Telemedicine Encounter- SOAP NOTE Established Patient  This telephone encounter was conducted with the patient's (or proxy's) verbal consent via audio telecommunications: yes/no: Yes Patient was instructed to have this encounter in a suitably private space; and to only have persons present to whom they give permission to participate. In addition, patient identity was confirmed by use of name plus two identifiers (DOB and address).  I discussed the limitations, risks, security and privacy concerns of performing an evaluation and management service by telephone and the availability of in person appointments. I also discussed with the patient that there may be a patient responsible charge related to this service. The patient expressed understanding and agreed to proceed.   No chief complaint on file. Possible COVID exposure  Subjective   Margaret Fletcher is a 48 y.o. female established patient. Telephone visit today concerned about possible COVID exposure.  Also been exposed at work.  Asymptomatic.  Wants to be tested.  HPI   Patient Active Problem List   Diagnosis Date Noted  . Reactive depression 06/13/2017  . Chronic pain syndrome 06/13/2017  . High risk medications (not anticoagulants) long-term use 06/13/2017  . Empty sella syndrome ([place]) 05/01/2017  . Hypoglycemia after GI (gastrointestinal) surgery 05/01/2017  . Adjustment disorder with mixed anxiety and depressed mood 10/13/2016  . 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 Hypokalemia 02/22/2016  . Constipation, chronic 02/22/2016  . Memory loss due to medical condition 12/14/2015  . Chronic fatigue 12/14/2015  . Recurrent dehydration 09/30/2015  . G tube feedings ([place]) 09/13/2015  . Chronic nausea 08/23/2015  . Abdominal pain, chronic, epigastric 08/20/2015  . Protein-calorie malnutrition, moderate ([place]) 08/19/2015  . Nausea 08/08/2015  . Mild obstructive sleep apnea 07/08/2015  . Dyslipidemia 07/08/2015  . S/P  gastric bypass 07/08/2015  . IIH (idiopathic intracranial hypertension) 06/03/2015  . Worsening headaches 04/30/2015  . Vision changes 04/30/2015  . Perceived hearing changes 04/30/2015  . Chronic daily headache 04/21/2014  . Intractable migraine without aura and without status migrainosus 04/29/2013  . Liver lesion 03/21/2013  . Splenic cyst 03/21/2013  . Abdominal pain 03/21/2013  . Vulvitis 04/22/2011    Past Medical History:  Diagnosis Date  . Alopecia   . Anxiety   . Autoimmune disease ([place])    "Alopecia"  . Bacterial vaginosis   . Cyst of spleen   . Depression   . Frequent UTI   . Hypertrophy of breast   . IIH (idiopathic intracranial hypertension) 2017   "related to headaches"  . Migraine headache    denies, ruled out - Propranolol.Using for headaches    Current Outpatient Medications  Medication Sig Dispense Refill  . albuterol (PROVENTIL HFA;VENTOLIN HFA) 108 (90 Base) MCG/ACT inhaler Inhale 2 puffs into the lungs every 6 (six) hours as needed for wheezing or shortness of breath. 1 Inhaler 0  . amitriptyline (ELAVIL) 10 MG tablet TAKE 2 TABLETS BY MOUTH AT BEDTIME. 60 tablet 2  . amphetamine-dextroamphetamine (ADDERALL) 15 MG tablet Take 1 tablet by mouth 2 (two) times daily with a meal. 60 tablet 0  . BAYER MICROLET LANCETS lancets Use as instructed 100 each 12  . Buprenorphine HCl 300 MCG FILM Place 300 mcg inside cheek every 12 (twelve) hours. Can also take for breakthrough pain per patient    . CALCIUM-VITAMIN D PO Take 1 tablet by mouth 2 (two) times daily. chewable    . clonazePAM (KLONOPIN) 0.5 MG tablet Take 1 tablet (0.5 mg total) by mouth daily as needed for  anxiety. 30 tablet 2  . Cyanocobalamin (B-12 PO) Take by mouth daily.    . Cyanocobalamin (VITAMIN B-12 SL) Place 1 drop under the tongue daily. 1 dropperful    . cyclobenzaprine (FLEXERIL) 5 MG tablet TAKE 1 TABLET BY MOUTH 3 TIMES DAILY AS NEEDED FOR MUSCLE SPASMS. MAY TAKE 2 TABS BY MOUTH AT BEDTIME  40 tablet 2  . desvenlafaxine (PRISTIQ) 100 MG 24 hr tablet Take 1 tablet (100 mg total) by mouth daily. 90 tablet 0  . docusate sodium (COLACE) 100 MG capsule Take 100 mg by mouth daily.    Marland Kitchen eletriptan (RELPAX) 40 MG tablet Take 1 tablet (40 mg total) by mouth every 2 (two) hours as needed for migraine or headache. Do not use >2 doses/24 hours 10 tablet 2  . glucose blood (CONTOUR NEXT TEST) test strip 1 each by Other route 4 (four) times daily as needed (hypoglycemia). K91.2 270 each 5  . lactulose (CHRONULAC) 10 GM/15ML solution Take 30 g by mouth daily as needed for moderate constipation.   0  . magic mouthwash w/lidocaine SOLN Take 5 mLs by mouth 4 (four) times daily as needed for mouth pain. 120 mL 0  . Multiple Vitamins-Minerals (BARIATRIC MULTIVITAMINS/IRON PO) Take 1 tablet by mouth 3 (three) times daily.    . nitrofurantoin, macrocrystal-monohydrate, (MACROBID) 100 MG capsule Take 1 capsule (100 mg total) by mouth 2 (two) times daily. 20 capsule 0  . Nutritional Supplements (FEEDING SUPPLEMENT, VITAL HIGH PROTEIN,) LIQD liquid Place 1,000 mLs into feeding tube daily. 77ml/hr for 8hrs a day as needed for poor oral intake; 300 ml free water q4hr (Patient taking differently: Place 1,000 mLs into feeding tube daily. 32ml/hr for 8hrs a day as needed for poor oral intake; 300 ml free water q4hr) 1000 mL 8  . nystatin (MYCOSTATIN) 100000 UNIT/ML suspension 5 ml po qid. Swish in mouth and gargle as long as possible then swallowg. Stop 48hrs AFTER symptoms gone 280 mL 0  . Omega-3 Fatty Acids (OMEGA-3 FISH OIL PO) Take by mouth daily.    Marland Kitchen omeprazole (PRILOSEC) 20 MG capsule Take 1 capsule (20 mg total) by mouth 2 (two) times daily before a meal for 14 days. 28 capsule 0  . ondansetron (ZOFRAN ODT) 4 MG disintegrating tablet Take 1 tablet (4 mg total) by mouth every 8 (eight) hours as needed for nausea or vomiting. 20 tablet 0  . OVER THE COUNTER MEDICATION Meca root with xulane patch for hormone and  premenopausal sx (wears all week).    Marland Kitchen OVER THE COUNTER MEDICATION Calcium, magnesium, zinc with D3 (one tablet daily)    . OVER THE COUNTER MEDICATION Collagen and vitamin c and biotin (takes one daily)    . OVER THE COUNTER MEDICATION Tunic for muscle and joint health    . polyethylene glycol (MIRALAX / GLYCOLAX) packet Take 17 g by mouth daily as needed (for constipation.).     Marland Kitchen simethicone (MYLICON) 80 MG chewable tablet Chew 80 mg by mouth every 6 (six) hours as needed for flatulence.    . topiramate (TOPAMAX) 100 MG tablet Take 1.5 tablets (150 mg total) by mouth daily. 30 tablet 2  . Vitamin D, Ergocalciferol, (DRISDOL) 1.25 MG (50000 UT) CAPS capsule Take 1 capsule by mouth daily.     No current facility-administered medications for this visit.     Allergies  Allergen Reactions  . Sulfa Antibiotics Anaphylaxis  . Metoclopramide Other (See Comments)    Elevated prolactin >5x ULN induction lactation  when taking scheduled for several months  . Zosyn [Piperacillin Sod-Tazobactam So] Rash    Has patient had a PCN reaction causing immediate rash, facial/tongue/throat swelling, SOB or lightheadedness with hypotension: No Has patient had a PCN reaction causing severe rash involving mucus membranes or skin necrosis: No Has patient had a PCN reaction that required hospitalization: Unknown--inpatient when reaction occurred Has patient had a PCN reaction occurring within the last 10 years: Yes If all of the above answers are "NO", then may proceed with Cephalosporin use.     Social History   Socioeconomic History  . Marital status: Divorced    Spouse name: Not on file  . Number of children: 2  . Years of education: 10  . Highest education level: Not on file  Occupational History  . 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 Financial resource strain: Not on file  . Food insecurity    Worry: Not on file    Inability: Not on file  . Transportation needs    Medical: Not  on file    Non-medical: Not on file  Tobacco Use  . Smoking status: Never Smoker  . Smokeless tobacco: Never Used  Substance and Sexual Activity  . Alcohol use: Not Currently  . Drug use: No  . Sexual activity: Yes    Birth control/protection: Surgical  Lifestyle  . Physical activity    Days per week: Not on file    Minutes per session: Not on file  . Stress: Not on file  Relationships  . Social Herbalist on phone: Not on file    Gets together: Not on file    Attends religious service: Not on file    Active member of club or organization: Not on file    Attends meetings of clubs or organizations: Not on file    Relationship status: Not on file  . Intimate partner violence    Fear of current or ex partner: Not on file    Emotionally abused: Not on file    Physically abused: Not on file    Forced sexual activity: Not on file  Other Topics Concern  . Not on file  Social History Narrative   Lives with partner   Caffeine use: minimal coffee   Right-handed    Review of Systems  Constitutional: Negative.  Negative for chills and fever.  HENT: Negative for congestion and sore throat.   Eyes: Negative.   Respiratory: Negative.  Negative for cough and shortness of breath.   Cardiovascular: Negative.  Negative for chest pain and palpitations.  Gastrointestinal: Negative for abdominal pain, diarrhea, nausea and vomiting.  Genitourinary: Negative.   Musculoskeletal: Negative.  Negative for myalgias.  Skin: Negative.  Negative for rash.  Neurological: Negative for dizziness and headaches.  All other systems reviewed and are negative.   Objective  Alert and oriented x3 in no apparent respiratory distress. Vitals as reported by the patient: There were no vitals filed for this visit.  Diagnoses and all orders for this visit:  Close Exposure to Covid-19 Virus -     Novel Coronavirus, NAA ([facility])  Asymptomatic. COVID instructions given.   I discussed the  assessment and treatment plan with the patient. The patient was provided an opportunity to ask questions and all were answered. The patient agreed with the plan and demonstrated an understanding of the instructions.   The patient was advised to call back or seek an in-person evaluation if the symptoms worsen  or if the condition fails to improve as anticipated.    Horald Pollen, MD  Primary Care at [facility]

## 2018-10-13 ENCOUNTER — Telehealth: Payer: Medicare Other | Admitting: Emergency Medicine

## 2018-10-14 ENCOUNTER — Other Ambulatory Visit: Payer: Self-pay

## 2018-10-14 ENCOUNTER — Encounter: Payer: Self-pay | Admitting: Family Medicine

## 2018-10-14 ENCOUNTER — Telehealth (INDEPENDENT_AMBULATORY_CARE_PROVIDER_SITE_OTHER): Payer: Medicare Other | Admitting: Family Medicine

## 2018-10-14 VITALS — Temp 97.2°F | Ht 68.0 in | Wt 190.0 lb

## 2018-10-14 DIAGNOSIS — R030 Elevated blood-pressure reading, without diagnosis of hypertension: Secondary | ICD-10-CM | POA: Diagnosis not present

## 2018-10-14 LAB — NOVEL CORONAVIRUS, NAA: SARS-CoV-2, NAA: NOT DETECTED

## 2018-10-14 NOTE — Patient Instructions (Addendum)
If you have lab work done today you will be contacted with your lab results within the next 2 weeks.  If you have not heard from [name] then please contact us. The fastest way to get your results is to register for My Chart.   IF you received an x-ray today, you will receive an invoice from [facility]. Please contact [facility] at [phone] with questions or concerns regarding your invoice.   IF you received labwork today, you will receive an invoice from [facility]. Please contact [facility] at [phone] with questions or concerns regarding your invoice.   Our billing staff will not be able to assist you with questions regarding bills from these companies.  You will be contacted with the lab results as soon as they are available. The fastest way to get your results is to activate your My Chart account. Instructions are located on the last page of this paperwork. If you have not heard from [name] regarding the results in 2 weeks, please contact this office.      How to Take Your Blood Pressure You can take your blood pressure at home with a machine. You may need to check your blood pressure at home:  To check if you have high blood pressure (hypertension).  To check your blood pressure over time.  To make sure your blood pressure medicine is working. Supplies needed: You will need a blood pressure machine, or monitor. You can buy one at a drugstore or online. When choosing one:  Choose one with an arm cuff.  Choose one that wraps around your upper arm. Only one finger should fit between your arm and the cuff.  Do not choose one that measures your blood pressure from your wrist or finger. Your doctor can suggest a monitor. How to prepare Avoid these things for 30 minutes before checking your blood pressure:  Drinking caffeine.  Drinking alcohol.  Eating.  Smoking.  Exercising. Five minutes before checking your blood pressure:  Pee.  Sit in a dining  chair. Avoid sitting in a soft couch or armchair.  Be quiet. Do not talk. How to take your blood pressure Follow the instructions that came with your machine. If you have a digital blood pressure monitor, these may be the instructions: 1. Sit up straight. 2. Place your feet on the floor. Do not cross your ankles or legs. 3. Rest your left arm at the level of your heart. You may rest it on a table, desk, or chair. 4. Pull up your shirt sleeve. 5. Wrap the blood pressure cuff around the upper part of your left arm. The cuff should be 1 inch (2.5 cm) above your elbow. It is best to wrap the cuff around bare skin. 6. Fit the cuff snugly around your arm. You should be able to place only one finger between the cuff and your arm. 7. Put the cord inside the groove of your elbow. 8. Press the power button. 9. Sit quietly while the cuff fills with air and loses air. 10. Write down the numbers on the screen. 11. Wait 2-3 minutes and then repeat steps 1-10. What do the numbers mean? Two numbers make up your blood pressure. The first number is called systolic pressure. The second is called diastolic pressure. An example of a blood pressure reading is "120 over 80" (or 120/80). If you are an adult and do not have a medical condition, use this guide to find out if your blood pressure is  normal: Normal  First number: below 120.  Second number: below 80. Elevated  First number: 120-129.  Second number: below 80. Hypertension stage 1  First number: 130-139.  Second number: 80-89. Hypertension stage 2  First number: 140 or above.  Second number: 43 or above. Your blood pressure is above normal even if only the top or bottom number is above normal. Follow these instructions at home:  Check your blood pressure as often as your doctor tells you to.  Take your monitor to your next doctor's appointment. Your doctor will: ? Make sure you are using it correctly. ? Make sure it is working  right.  Make sure you understand what your blood pressure numbers should be.  Tell your doctor if your medicines are causing side effects. Contact a doctor if:  Your blood pressure keeps being high. Get help right away if:  Your first blood pressure number is higher than 180.  Your second blood pressure number is higher than 120. This information is not intended to replace advice given to you by your health care provider. Make sure you discuss any questions you have with your health care provider. Document Released: 02/06/2008 Document Revised: 02/05/2017 Document Reviewed: 08/02/2015 Elsevier Patient Education  2020 [employer].

## 2018-10-14 NOTE — Progress Notes (Signed)
Virtual Visit Note  I connected with patient on 10/14/18 at 1116am by phone and verified that I am speaking with the correct person using two identifiers. Margaret Fletcher is currently located at home and patient is currently with them during visit. The provider, Rutherford Guys, MD is located in their office at time of visit.  I discussed the limitations, risks, security and privacy concerns of performing an evaluation and management service by telephone and the availability of in person appointments. I also discussed with the patient that there may be a patient responsible charge related to this service. The patient expressed understanding and agreed to proceed.   CC: high BP  HPI ? 48 yo F with PMH of migraine, OSA, constipation, empty sella syndrome, G tube feeding, s/p gastric bypass presenting today for recent elevated BP  She went to see her neurologist as she was having increased headaches in setting of decreased dose of topomax Neurologist checked her BP several times and it remained high Patient unable to check her BP at home No changes in medications prior to elevated BP Denies any blurry vision, nausea, chest pain, SOB, palpitations, edema, focal weakness    BP Readings from Last 3 Encounters:  10/06/18 (!) 148/103  09/22/18 125/86  04/26/18 136/88    Allergies  Allergen Reactions  . Sulfa Antibiotics Anaphylaxis  . Metoclopramide Other (See Comments)    Elevated prolactin >5x ULN induction lactation when taking scheduled for several months  . Zosyn [Piperacillin Sod-Tazobactam So] Rash    Has patient had a PCN reaction causing immediate rash, facial/tongue/throat swelling, SOB or lightheadedness with hypotension: No Has patient had a PCN reaction causing severe rash involving mucus membranes or skin necrosis: No Has patient had a PCN reaction that required hospitalization: Unknown--inpatient when reaction occurred Has patient had a PCN reaction occurring within  the last 10 years: Yes If all of the above answers are "NO", then may proceed with Cephalosporin use.     Prior to Admission medications   Medication Sig Start Date End Date Taking? Authorizing Provider  albuterol (PROVENTIL HFA;VENTOLIN HFA) 108 (90 Base) MCG/ACT inhaler Inhale 2 puffs into the lungs every 6 (six) hours as needed for wheezing or shortness of breath. 01/25/18  Yes Shawnee Knapp, MD  amitriptyline (ELAVIL) 10 MG tablet TAKE 2 TABLETS BY MOUTH AT BEDTIME. 10/11/18  Yes Rutherford Guys, MD  amphetamine-dextroamphetamine (ADDERALL) 15 MG tablet TAKE 1 TABLET BY MOUTH 2 TIMES DAILY FOR 30 DAYS.(DNF 08/17/18) 10/12/18  Yes Forrest Moron, MD  BAYER MICROLET LANCETS lancets Use as instructed 06/13/17  Yes Shawnee Knapp, MD  Buprenorphine HCl 300 MCG FILM Place 300 mcg inside cheek every 12 (twelve) hours. Can also take for breakthrough pain per patient   Yes [provider]  CALCIUM-VITAMIN D PO Take 1 tablet by mouth 2 (two) times daily. chewable   Yes [provider]  clonazePAM (KLONOPIN) 0.5 MG tablet Take 1 tablet (0.5 mg total) by mouth daily as needed for anxiety. 06/17/18  Yes Rutherford Guys, MD  Cyanocobalamin (VITAMIN B-12 SL) Place 1 drop under the tongue daily. 1 dropperful   Yes [provider]  cyclobenzaprine (FLEXERIL) 5 MG tablet TAKE 1 TABLET BY MOUTH 3 TIMES DAILY AS NEEDED FOR MUSCLE SPASMS. MAY TAKE 2 TABS BY MOUTH AT BEDTIME 06/17/18  Yes Rutherford Guys, MD  desvenlafaxine (PRISTIQ) 100 MG 24 hr tablet Take 1 tablet (100 mg total) by mouth daily. 06/17/18  Yes  Rutherford Guys, MD  docusate sodium (COLACE) 100 MG capsule Take 100 mg by mouth daily.   Yes [provider]  eletriptan (RELPAX) 40 MG tablet Take 1 tablet (40 mg total) by mouth every 2 (two) hours as needed for migraine or headache. Do not use >2 doses/24 hours 03/23/18  Yes Shawnee Knapp, MD  glucose blood (CONTOUR NEXT TEST) test strip 1 each by Other route 4 (four) times  daily as needed (hypoglycemia). K91.2 06/10/17  Yes Shawnee Knapp, MD  lactulose (CHRONULAC) 10 GM/15ML solution Take 30 g by mouth daily as needed for moderate constipation.  01/14/16  Yes [provider]  Multiple Vitamins-Minerals (BARIATRIC MULTIVITAMINS/IRON PO) Take 1 tablet by mouth 3 (three) times daily.   Yes [provider]  Nutritional Supplements (FEEDING SUPPLEMENT, VITAL HIGH PROTEIN,) LIQD liquid Place 1,000 mLs into feeding tube daily. 68ml/hr for 8hrs a day as needed for poor oral intake; 300 ml free water q4hr Patient taking differently: Place 1,000 mLs into feeding tube daily. 55ml/hr for 8hrs a day as needed for poor oral intake; 300 ml free water q4hr 02/24/16  Yes Greer Pickerel, MD  Omega-3 Fatty Acids (OMEGA-3 FISH OIL PO) Take by mouth daily.   Yes [provider]  ondansetron (ZOFRAN ODT) 4 MG disintegrating tablet Take 1 tablet (4 mg total) by mouth every 8 (eight) hours as needed for nausea or vomiting. 03/22/18  Yes McDonald, Mia A, PA-C  OVER THE COUNTER MEDICATION Meca root with xulane patch for hormone and premenopausal sx (wears all week).   Yes [provider]  OVER THE COUNTER MEDICATION Calcium, magnesium, zinc with D3 (one tablet daily)   Yes [provider]  OVER THE COUNTER MEDICATION Collagen and vitamin c and biotin (takes one daily)   Yes [provider]  [place] for muscle and joint health   Yes [provider]  polyethylene glycol (MIRALAX / GLYCOLAX) packet Take 17 g by mouth daily as needed (for constipation.).    Yes [provider]  simethicone (MYLICON) 80 MG chewable tablet Chew 80 mg by mouth every 6 (six) hours as needed for flatulence.   Yes [provider]  topiramate (TOPAMAX) 100 MG tablet Take 1.5 tablets (150 mg total) by mouth daily. 10/06/18  Yes Lomax, Amy, NP  nitrofurantoin, macrocrystal-monohydrate, (MACROBID) 100 MG capsule Take 1 capsule  (100 mg total) by mouth 2 (two) times daily. Patient not taking: Reported on 10/14/2018 09/22/18   Rutherford Guys, MD  nystatin (MYCOSTATIN) 100000 UNIT/ML suspension 5 ml po qid. Swish in mouth and gargle as long as possible then swallowg. Stop 48hrs AFTER symptoms gone Patient not taking: Reported on 10/14/2018 04/26/18   Rutherford Guys, MD  omeprazole (PRILOSEC) 20 MG capsule Take 1 capsule (20 mg total) by mouth 2 (two) times daily before a meal for 14 days. 04/26/18 05/10/18  Rutherford Guys, MD    Past Medical History:  Diagnosis Date  . Alopecia   . Anxiety   . Autoimmune disease ([place])    "Alopecia"  . Bacterial vaginosis   . Cyst of spleen   . Depression   . Frequent UTI   . Hypertrophy of breast   . IIH (idiopathic intracranial hypertension) 2017   "related to headaches"  . Migraine headache    denies, ruled out - Propranolol.Using for headaches    Past Surgical History:  Procedure Laterality Date  . APPENDECTOMY    . BREAST  EXCISIONAL BIOPSY Right 2017   removed at time of reduction  . BREAST REDUCTION SURGERY Bilateral 03/26/2015   Procedure: BILATERAL BREAST REDUCTION  ;  Surgeon: Youlanda Roys, MD;  Location: [facility];  Service: Plastics;  Laterality: Bilateral;  . BREATH TEK H PYLORI N/A 05/21/2014   Procedure: BREATH TEK H PYLORI;  Surgeon: Greer Pickerel, MD;  Location: [facility];  Service: General;  Laterality: N/A;  . CELIAC PLEXUS BLOCK  07/18/2018   Per patient it was done at [facility]    . CESAREAN SECTION    . CHOLECYSTECTOMY    . DILITATION & CURRETTAGE/HYSTROSCOPY WITH NOVASURE ABLATION N/A 07/19/2012   Procedure: HYSTEROSCOPY WITH NOVASURE ABLATION;  Surgeon: Farrel Gobble. Harrington Challenger, MD;  Location: [facility];  Service: Gynecology;  Laterality: N/A;  . ENDOMETRIAL ABLATION    . ESOPHAGOGASTRODUODENOSCOPY N/A 08/16/2015   Procedure: UPPER ESOPHAGOGASTRODUODENOSCOPY (EGD);  Surgeon: Greer Pickerel, MD;  Location: [facility];   Service: General;  Laterality: N/A;  . ESOPHAGOGASTRODUODENOSCOPY N/A 02/21/2016   Procedure: ESOPHAGOGASTRODUODENOSCOPY (EGD);  Surgeon: Greer Pickerel, MD;  Location: [facility];  Service: General;  Laterality: N/A;  . ESOPHAGOGASTRODUODENOSCOPY N/A 02/24/2017   Procedure: ESOPHAGOGASTRODUODENOSCOPY (EGD);  Surgeon: Greer Pickerel, MD;  Location: [facility];  Service: General;  Laterality: N/A;  . IR GENERIC HISTORICAL  10/04/2015   IR [place] Vivianne Master 10/04/2015 Markus Daft, MD [facility]  . IR GENERIC HISTORICAL  01/31/2016   IR [place] GASTRO/COLONIC TUBE PERCUT W/FLUORO 01/31/2016 Corrie Mckusick, DO [facility]  . IR GENERIC HISTORICAL  04/06/2016   IR [place] TUBE PERCUT W/FLUORO 04/06/2016 Arne Cleveland, MD [facility]  . IR GENERIC HISTORICAL  05/14/2016   IR GASTRIC TUBE PERC CHG W/O IMG GUIDE 05/14/2016 Sandi Mariscal, MD [facility]  . LAPAROSCOPIC GASTROSTOMY N/A 09/30/2015   Procedure: LAPAROSCOPIC GASTROSTOMY TUBE PLACEMENT;  Surgeon: Greer Pickerel, MD;  Location: [facility];  Service: General;  Laterality: N/A;  . LAPAROSCOPIC ROUX-EN-Y GASTRIC BYPASS WITH HIATAL HERNIA REPAIR N/A 07/08/2015   Procedure: LAPAROSCOPIC ROUX-EN-Y GASTRIC BYPASS WITH HIATAL HERNIA REPAIR WITH UPPER ENDOSCOPY;  Surgeon: Greer Pickerel, MD;  Location: [facility];  Service: General;  Laterality: N/A;  . LAPAROSCOPIC SMALL BOWEL RESECTION N/A 09/30/2015   Procedure: LAPAROSCOPIC REVISION OF ROUX LIMB;  Surgeon: Greer Pickerel, MD;  Location: [facility];  Service: General;  Laterality: N/A;  . LAPAROSCOPY N/A 09/30/2015   Procedure: LAPAROSCOPY DIAGNOSTIC;  Surgeon: Greer Pickerel, MD;  Location: [facility];  Service: General;  Laterality: N/A;  . REDUCTION MAMMAPLASTY Bilateral 2017  . TUBAL LIGATION    . WISDOM TOOTH EXTRACTION      Social History   Tobacco Use  . Smoking status: Never Smoker  . Smokeless tobacco: Never Used  Substance Use Topics  . Alcohol use: Not Currently    Family  History  Problem Relation Age of Onset  . Cancer Mother        late 81'-50  . Breast cancer Mother        late 66'-50  . Stroke Father   . Diabetes Brother   . Seizures Son   . Breast cancer Maternal Aunt        late 40'-50  . Breast cancer Maternal Aunt        late 40'-50  . Breast cancer Maternal Aunt        late 40'-50  . Migraines Neg Hx     ROS Per hpi  Objective  Vitals  as reported by the patient: per above   ASSESSMENT and PLAN  1. Elevated BP without diagnosis of hypertension Patient with no previous elevations in BP. Patient instructed to buy BP cuff and check BP daily. Technique reviewed with patient. She is to send me readings via mychart. Further decisions will be made then. Strict ER precautions given.   FOLLOW-UP: 1 week   The above assessment and management plan was discussed with the patient. The patient verbalized understanding of and has agreed to the management plan. Patient is aware to call the clinic if symptoms persist or worsen. Patient is aware when to return to the clinic for a follow-up visit. Patient educated on when it is appropriate to go to the emergency department.    I provided 10 minutes of non-face-to-face time during this encounter.  Rutherford Guys, MD [facility] at [facility] [place], [place] [address] Ph.  [phone] Fax [phone]

## 2018-10-14 NOTE — Progress Notes (Signed)
Elevated BP at the Neurologist off recently. They contributed to the increased headache for the last couple of weeks.

## 2018-10-25 MED FILL — ONDANSETRON HCL 4 MG TABLET: 4 | 10 days supply | Qty: 20 | Fill #0

## 2018-10-25 MED FILL — CYCLOBENZAPRINE HCL 5 MG TA: 5 | 30 days supply | Qty: 30 | Fill #0

## 2018-10-25 MED FILL — ELETRIPTAN HBR 40 MG TABLET: 40 | 30 days supply | Qty: 10 | Fill #0

## 2018-11-09 MED FILL — BELBUCA 600 MCG FILM: 600 | 30 days supply | Qty: 60 | Fill #0

## 2018-11-09 MED FILL — ONDANSETRON ODT 8 MG TABLET: 8 | 10 days supply | Qty: 20 | Fill #0

## 2018-11-18 ENCOUNTER — Other Ambulatory Visit: Payer: Self-pay | Admitting: Family Medicine

## 2018-11-18 MED FILL — TOPIRAMATE 100 MG TABLET: 100 | 20 days supply | Qty: 30 | Fill #1

## 2018-11-18 MED FILL — BELBUCA 600 MCG FILM: 600 | 30 days supply | Qty: 60 | Fill #0

## 2018-11-18 MED FILL — XULANE PATCH: 150-35 | 21 days supply | Qty: 3 | Fill #2

## 2018-11-18 MED FILL — ONDANSETRON ODT 8 MG TABLET: 8 | 10 days supply | Qty: 20 | Fill #0

## 2018-11-18 MED FILL — AMITRIPTYLINE HCL 10 MG TAB: 10 | 30 days supply | Qty: 60 | Fill #1

## 2018-11-18 MED FILL — AMPHETAMINE-DEXTROAMPHETAMI: 15 | 30 days supply | Qty: 60 | Fill #0

## 2018-11-18 NOTE — Telephone Encounter (Signed)
Requested medication (s) are due for refill today:yes  Requested medication (s) are on the active medication list: yes  Last refill:  10/11/2018  Future visit scheduled: no  Notes to clinic: refill cannot be delegated   Requested Prescriptions  Pending Prescriptions Disp Refills   clonazePAM (KLONOPIN) 0.5 MG tablet [Pharmacy Med Name: clonazePAM 0.5 MG TABS 0.5 TAB] 30 tablet 2    Sig: TAKE 1 TABLET BY MOUTH DAILY AS NEEDED FOR ANXIETY.     Not Delegated - Psychiatry:  Anxiolytics/Hypnotics Failed - 11/18/2018 12:40 PM      Failed - This refill cannot be delegated      Failed - Urine Drug Screen completed in last 360 days.      Passed - Valid encounter within last 6 months    Recent Outpatient Visits          1 month ago Elevated BP without diagnosis of hypertension   [facility], Lilia Argue, MD   1 month ago Close Exposure to Covid-19 Virus   [facility] at [facility], [place], MD   1 month ago Acute cystitis without hematuria   [facility], Lilia Argue, MD   6 months ago Sore throat   [facility], Lilia Argue, MD   8 months ago Glossitis   [facility], Ranell Patrick, MD              desvenlafaxine (PRISTIQ) 100 MG 24 hr tablet [Pharmacy Med Name: DESVENLAFAXINE SUC ER 100 M 100 TAB] 90 tablet 0    Sig: TAKE 1 TABLET BY MOUTH DAILY.     Psychiatry: Antidepressants - SNRI - desvenlafaxine & venlafaxine Failed - 11/18/2018 12:40 PM      Failed - LDL in normal range and within 360 days    No results found for: LDLCALC, LDLC, HIRISKLDL       Failed - Total Cholesterol in normal range and within 360 days    No results found for: CHOL, POCCHOL       Failed - Triglycerides in normal range and within 360 days    No results found for: TRIG       Failed - Last BP in normal range    BP Readings from Last 1 Encounters:  10/06/18 (!) 148/103         Passed - Valid encounter within last 6 months   Recent Outpatient Visits          1 month ago Elevated BP without diagnosis of hypertension   [facility], Lilia Argue, MD   1 month ago Close Exposure to Covid-19 Virus   [facility] at [facility], [place], MD   1 month ago Acute cystitis without hematuria   [facility], Lilia Argue, MD   6 months ago Sore throat   [facility], Lilia Argue, MD   8 months ago Glossitis   [facility] at [facility], MD             Passed - Completed PHQ-2 or PHQ-9 in the last 360 days.

## 2018-11-22 NOTE — Telephone Encounter (Signed)
pmp reviewd, appropriate meds refilled Patient referred to psych in July 2020, upon review of referral it does not seem to have been received. Sending again.

## 2018-11-23 MED FILL — DESVENLAFAXINE SUC ER 100 M: 100 | 90 days supply | Qty: 90 | Fill #0

## 2018-11-23 MED FILL — clonazePAM 0.5 MG TABS: 0.5 | 30 days supply | Qty: 30 | Fill #0

## 2018-11-28 ENCOUNTER — Ambulatory Visit (INDEPENDENT_AMBULATORY_CARE_PROVIDER_SITE_OTHER): Payer: Medicare Other | Admitting: Family Medicine

## 2018-11-28 ENCOUNTER — Other Ambulatory Visit (HOSPITAL_COMMUNITY): Admit: 2018-11-28 | Payer: BLUE CROSS/BLUE SHIELD

## 2018-11-28 ENCOUNTER — Telehealth: Payer: Self-pay | Admitting: Family Medicine

## 2018-11-28 ENCOUNTER — Encounter: Payer: Self-pay | Admitting: Family Medicine

## 2018-11-28 ENCOUNTER — Other Ambulatory Visit: Payer: Self-pay

## 2018-11-28 VITALS — BP 159/98 | HR 80 | Temp 99.3°F | Ht 68.0 in | Wt 204.8 lb

## 2018-11-28 DIAGNOSIS — I10 Essential (primary) hypertension: Secondary | ICD-10-CM

## 2018-11-28 DIAGNOSIS — Z23 Encounter for immunization: Secondary | ICD-10-CM

## 2018-11-28 DIAGNOSIS — N898 Other specified noninflammatory disorders of vagina: Secondary | ICD-10-CM

## 2018-11-28 MED ORDER — TRIAMTERENE-HCTZ 37.5-25 MG PO TABS: 1.0000 | ORAL_TABLET | Freq: Every day | ORAL | 2 refills | Status: DC

## 2018-11-28 MED FILL — TRIAMTERENE/HCTZ 37.5/25 TB: 37.5-25 | 30 days supply | Qty: 30 | Fill #0

## 2018-11-28 MED FILL — PREMARIN VAGINAL CREAM-APPL: 0.625 | 90 days supply | Qty: 30 | Fill #0

## 2018-11-28 MED FILL — TRIAMCINOLONE 0.1% OINTMENT: 0.1 | 15 days supply | Qty: 30 | Fill #0

## 2018-11-28 NOTE — Patient Instructions (Signed)
° ° ° °  If you have lab work done today you will be contacted with your lab results within the next 2 weeks.  If you have not heard from us then please contact us. The fastest way to get your results is to register for My Chart. ° ° °IF you received an x-ray today, you will receive an invoice from [facility]. Please contact [facility] at [phone] with questions or concerns regarding your invoice.  ° °IF you received labwork today, you will receive an invoice from [facility]. Please contact [facility] at [phone] with questions or concerns regarding your invoice.  ° °Our billing staff will not be able to assist you with questions regarding bills from these companies. ° °You will be contacted with the lab results as soon as they are available. The fastest way to get your results is to activate your My Chart account. Instructions are located on the last page of this paperwork. If you have not heard from us regarding the results in 2 weeks, please contact this office. °  ° ° ° °

## 2018-11-28 NOTE — Progress Notes (Signed)
9/21/20208:22 AM  [name] [birth year], 48 y.o., female [id number]  Chief Complaint  Patient presents with  . Hypertension    f/u   . right finger numbness    f/u from last time     HPI:   Patient is a 48 y.o. female with past medical history significant for migraine, OSA, constipation, empty sella syndrome, G tube feeding, s/p gastric bypass who presents today for BP   Saw neuro in July had elevated reading with worsening headache Has been checking BP at home since then 130-140s/80-90s Her BP cuff has not been compared to ours She has no known history of elevated BP prior She has noticed decreased in activity tolerance, has noticed times when she feels she is going to pass out, gets sweaty, but normally related to low sugars No chest pain, no SOB, no vision changes She has continued to have headaches despite increase in topomax  Thinks she is having a recurring yeast infections   Wt Readings from Last 3 Encounters:  11/28/18 204 lb 12.8 oz (92.9 kg)  10/14/18 190 lb (86.2 kg)  10/06/18 198 lb (89.8 kg)     Depression screen [facility] 2/9 11/28/2018 10/12/2018 09/22/2018  Decreased Interest 0 0 -  Down, Depressed, Hopeless 0 0 0  PHQ - 2 Score 0 0 0  Altered sleeping - - -  Tired, decreased energy - - -  Change in appetite - - -  Feeling bad or failure about yourself  - - -  Trouble concentrating - - -  Moving slowly or fidgety/restless - - -  Suicidal thoughts - - -  PHQ-9 Score - - -  Difficult doing work/chores - - -  Some recent data might be hidden    Fall Risk  11/28/2018 10/12/2018 09/22/2018 09/22/2018 04/26/2018  Falls in the past year? 0 0 0 0 0  Number falls in past yr: 0 0 0 0 0  Injury with Fall? 0 0 0 0 0  Comment - - - - -  Follow up Falls evaluation completed - - - -     Allergies  Allergen Reactions  . Sulfa Antibiotics Anaphylaxis  . Metoclopramide Other (See Comments)    Elevated prolactin >5x ULN induction lactation when taking  scheduled for several months  . Zosyn [Piperacillin Sod-Tazobactam So] Rash    Has patient had a PCN reaction causing immediate rash, facial/tongue/throat swelling, SOB or lightheadedness with hypotension: No Has patient had a PCN reaction causing severe rash involving mucus membranes or skin necrosis: No Has patient had a PCN reaction that required hospitalization: Unknown--inpatient when reaction occurred Has patient had a PCN reaction occurring within the last 10 years: Yes If all of the above answers are "NO", then may proceed with Cephalosporin use.     Prior to Admission medications   Medication Sig Start Date End Date Taking? Authorizing Provider  albuterol (PROVENTIL HFA;VENTOLIN HFA) 108 (90 Base) MCG/ACT inhaler Inhale 2 puffs into the lungs every 6 (six) hours as needed for wheezing or shortness of breath. 01/25/18  Yes Shawnee Knapp, MD  amitriptyline (ELAVIL) 10 MG tablet TAKE 2 TABLETS BY MOUTH AT BEDTIME. 10/11/18  Yes Rutherford Guys, MD  amphetamine-dextroamphetamine (ADDERALL) 15 MG tablet TAKE 1 TABLET BY MOUTH 2 TIMES DAILY FOR 30 DAYS.(DNF 08/17/18) 10/12/18  Yes Forrest Moron, MD  BAYER MICROLET LANCETS lancets Use as instructed 06/13/17  Yes Shawnee Knapp, MD  Buprenorphine HCl 300 MCG FILM Place 300 mcg inside  cheek every 12 (twelve) hours. Can also take for breakthrough pain per patient   Yes [provider]  CALCIUM-VITAMIN D PO Take 1 tablet by mouth 2 (two) times daily. chewable   Yes [provider]  clonazePAM (KLONOPIN) 0.5 MG tablet TAKE 1 TABLET BY MOUTH DAILY AS NEEDED FOR ANXIETY. 11/22/18  Yes Rutherford Guys, MD  Cyanocobalamin (VITAMIN B-12 SL) Place 1 drop under the tongue daily. 1 dropperful   Yes [provider]  cyclobenzaprine (FLEXERIL) 5 MG tablet TAKE 1 TABLET BY MOUTH 3 TIMES DAILY AS NEEDED FOR MUSCLE SPASMS. MAY TAKE 2 TABS BY MOUTH AT BEDTIME 06/17/18  Yes Rutherford Guys, MD  desvenlafaxine (PRISTIQ) 100 MG 24 hr tablet TAKE  1 TABLET BY MOUTH DAILY. 11/22/18  Yes Rutherford Guys, MD  docusate sodium (COLACE) 100 MG capsule Take 100 mg by mouth daily.   Yes [provider]  eletriptan (RELPAX) 40 MG tablet Take 1 tablet (40 mg total) by mouth every 2 (two) hours as needed for migraine or headache. Do not use >2 doses/24 hours 03/23/18  Yes Shawnee Knapp, MD  glucose blood (CONTOUR NEXT TEST) test strip 1 each by Other route 4 (four) times daily as needed (hypoglycemia). K91.2 06/10/17  Yes Shawnee Knapp, MD  lactulose (CHRONULAC) 10 GM/15ML solution Take 30 g by mouth daily as needed for moderate constipation.  01/14/16  Yes [provider]  Multiple Vitamins-Minerals (BARIATRIC MULTIVITAMINS/IRON PO) Take 1 tablet by mouth 3 (three) times daily.   Yes [provider]  Omega-3 Fatty Acids (OMEGA-3 FISH OIL PO) Take by mouth daily.   Yes [provider]  omeprazole (PRILOSEC) 20 MG capsule Take 1 capsule (20 mg total) by mouth 2 (two) times daily before a meal for 14 days. 04/26/18 11/28/18 Yes Rutherford Guys, MD  ondansetron (ZOFRAN ODT) 4 MG disintegrating tablet Take 1 tablet (4 mg total) by mouth every 8 (eight) hours as needed for nausea or vomiting. 03/22/18  Yes McDonald, Mia A, PA-C  OVER THE COUNTER MEDICATION Meca root with xulane patch for hormone and premenopausal sx (wears all week).   Yes [provider]  OVER THE COUNTER MEDICATION Calcium, magnesium, zinc with D3 (one tablet daily)   Yes [provider]  OVER THE COUNTER MEDICATION Collagen and vitamin c and biotin (takes one daily)   Yes [provider]  Arab for muscle and joint health   Yes [provider]  polyethylene glycol (MIRALAX / GLYCOLAX) packet Take 17 g by mouth daily as needed (for constipation.).    Yes [provider]  simethicone (MYLICON) 80 MG chewable tablet Chew 80 mg by mouth every 6 (six) hours as needed for flatulence.   Yes [provider]  topiramate (TOPAMAX) 100 MG tablet Take 1.5 tablets (150 mg total) by mouth daily. 10/06/18  Yes Lomax, Amy, NP    Past Medical History:  Diagnosis Date  . Alopecia   . Anxiety   . Autoimmune disease ([place])    "Alopecia"  . Bacterial vaginosis   . Cyst of spleen   . Depression   . Frequent UTI   . Hypertrophy of breast   . IIH (idiopathic intracranial hypertension) 2017   "related to headaches"  . Migraine headache    denies, ruled out - Propranolol.Using for headaches    Past Surgical History:  Procedure Laterality Date  . APPENDECTOMY    . BREAST EXCISIONAL BIOPSY Right  2017   removed at time of reduction  . BREAST REDUCTION SURGERY Bilateral 03/26/2015   Procedure: BILATERAL BREAST REDUCTION  ;  Surgeon: Youlanda Roys, MD;  Location: [place];  Service: Plastics;  Laterality: Bilateral;  . BREATH TEK H PYLORI N/A 05/21/2014   Procedure: BREATH TEK H PYLORI;  Surgeon: Greer Pickerel, MD;  Location: [facility];  Service: General;  Laterality: N/A;  . CELIAC PLEXUS BLOCK  07/18/2018   Per patient it was done at [place]    . CESAREAN SECTION    . CHOLECYSTECTOMY    . DILITATION & CURRETTAGE/HYSTROSCOPY WITH NOVASURE ABLATION N/A 07/19/2012   Procedure: HYSTEROSCOPY WITH NOVASURE ABLATION;  Surgeon: Farrel Gobble. Harrington Challenger, MD;  Location: [facility];  Service: Gynecology;  Laterality: N/A;  . ENDOMETRIAL ABLATION    . ESOPHAGOGASTRODUODENOSCOPY N/A 08/16/2015   Procedure: UPPER ESOPHAGOGASTRODUODENOSCOPY (EGD);  Surgeon: Greer Pickerel, MD;  Location: [facility];  Service: General;  Laterality: N/A;  . ESOPHAGOGASTRODUODENOSCOPY N/A 02/21/2016   Procedure: ESOPHAGOGASTRODUODENOSCOPY (EGD);  Surgeon: Greer Pickerel, MD;  Location: [facility];  Service: General;  Laterality: N/A;  . ESOPHAGOGASTRODUODENOSCOPY N/A 02/24/2017   Procedure: ESOPHAGOGASTRODUODENOSCOPY (EGD);  Surgeon: Greer Pickerel, MD;  Location: [facility];  Service:  General;  Laterality: N/A;  . IR GENERIC HISTORICAL  10/04/2015   IR [place] Vivianne Master 10/04/2015 Markus Daft, MD [facility]  . IR GENERIC HISTORICAL  01/31/2016   IR [place] GASTRO/COLONIC TUBE PERCUT W/FLUORO 01/31/2016 Corrie Mckusick, DO [facility]  . IR GENERIC HISTORICAL  04/06/2016   IR [place] TUBE PERCUT W/FLUORO 04/06/2016 Arne Cleveland, MD [facility]  . IR GENERIC HISTORICAL  05/14/2016   IR GASTRIC TUBE PERC CHG W/O IMG GUIDE 05/14/2016 Sandi Mariscal, MD [facility]  . LAPAROSCOPIC GASTROSTOMY N/A 09/30/2015   Procedure: LAPAROSCOPIC GASTROSTOMY TUBE PLACEMENT;  Surgeon: Greer Pickerel, MD;  Location: [facility];  Service: General;  Laterality: N/A;  . LAPAROSCOPIC ROUX-EN-Y GASTRIC BYPASS WITH HIATAL HERNIA REPAIR N/A 07/08/2015   Procedure: LAPAROSCOPIC ROUX-EN-Y GASTRIC BYPASS WITH HIATAL HERNIA REPAIR WITH UPPER ENDOSCOPY;  Surgeon: Greer Pickerel, MD;  Location: [facility];  Service: General;  Laterality: N/A;  . LAPAROSCOPIC SMALL BOWEL RESECTION N/A 09/30/2015   Procedure: LAPAROSCOPIC REVISION OF ROUX LIMB;  Surgeon: Greer Pickerel, MD;  Location: [facility];  Service: General;  Laterality: N/A;  . LAPAROSCOPY N/A 09/30/2015   Procedure: LAPAROSCOPY DIAGNOSTIC;  Surgeon: Greer Pickerel, MD;  Location: [facility];  Service: General;  Laterality: N/A;  . REDUCTION MAMMAPLASTY Bilateral 2017  . TUBAL LIGATION    . WISDOM TOOTH EXTRACTION      Social History   Tobacco Use  . Smoking status: Never Smoker  . Smokeless tobacco: Never Used  Substance Use Topics  . Alcohol use: Not Currently    Family History  Problem Relation Age of Onset  . Cancer Mother        late 39'-50  . Breast cancer Mother        late 57'-50  . Stroke Father   . Diabetes Brother   . Seizures Son   . Breast cancer Maternal Aunt        late 40'-50  . Breast cancer Maternal Aunt        late 40'-50  . Breast cancer Maternal Aunt        late 40'-50  . Migraines Neg Hx     Review of  Systems  Constitutional: Positive for malaise/fatigue. Negative for  chills and fever.  Respiratory: Negative for cough and shortness of breath.   Cardiovascular: Positive for leg swelling. Negative for chest pain and palpitations.  Gastrointestinal: Positive for abdominal pain, nausea and vomiting.  per hpi   OBJECTIVE:  Today's Vitals   11/28/18 0817  BP: (!) 159/98  Pulse: 80  Temp: 99.3 F (37.4 C)  TempSrc: Oral  SpO2: 99%  Weight: 204 lb 12.8 oz (92.9 kg)  Height: '5\' 8"'$  (1.727 m)   Body mass index is 31.14 kg/m.   Physical Exam Vitals signs and nursing note reviewed.  Constitutional:      Appearance: She is well-developed.  HENT:     Head: Normocephalic and atraumatic.     Mouth/Throat:     Pharynx: No oropharyngeal exudate.  Eyes:     General: No scleral icterus.    Conjunctiva/sclera: Conjunctivae normal.     Pupils: Pupils are equal, round, and reactive to light.  Neck:     Musculoskeletal: Neck supple.  Cardiovascular:     Rate and Rhythm: Normal rate and regular rhythm.     Heart sounds: Normal heart sounds. No murmur. No friction rub. No gallop.   Pulmonary:     Effort: Pulmonary effort is normal.     Breath sounds: Normal breath sounds. No wheezing or rales.  Musculoskeletal:     Right lower leg: Edema (trace pitting) present.     Left lower leg: Edema present.  Skin:    General: Skin is warm and dry.  Neurological:     Mental Status: She is alert and oriented to person, place, and time.     No results found for this or any previous visit (from the past 24 hour(s)).  No results found.  My interpretation of EKG:  NSR, HR 66, LAE, no acute ST changes.   ASSESSMENT and PLAN  1. Essential hypertension, benign New diagnosis. Start maxzide, discussed new med r/se/b. Discussed LFM.  - CMP14+EGFR - EKG 12-Lead  2. Vaginal discharge Advise discuss recurrent yeast infections with gyn - Cervicovaginal ancillary only  Other orders - Flu Vaccine  QUAD 36+ mos IM - triamterene-hydrochlorothiazide (MAXZIDE-25) 37.5-25 MG tablet; Take 1 tablet by mouth daily.  Return in about 3 months (around 02/27/2019).    Rutherford Guys, MD [facility] at [facility], [place] [address] Ph.  [phone] Fax [phone]

## 2018-11-28 NOTE — Telephone Encounter (Signed)
Pt wrote a letter for you. She stated that it was important. I placed in your box in the provider lounge.

## 2018-11-29 LAB — CMP14+EGFR
ALT: 11 IU/L (ref 0–32)
AST: 24 IU/L (ref 0–40)
Albumin/Globulin Ratio: 1.5 (ref 1.2–2.2)
Albumin: 3.5 g/dL — ABNORMAL LOW (ref 3.8–4.8)
Alkaline Phosphatase: 71 IU/L (ref 39–117)
BUN/Creatinine Ratio: 20 (ref 9–23)
BUN: 25 mg/dL — ABNORMAL HIGH (ref 6–24)
Bilirubin Total: 0.3 mg/dL (ref 0.0–1.2)
CO2: 22 mmol/L (ref 20–29)
Calcium: 8.5 mg/dL — ABNORMAL LOW (ref 8.7–10.2)
Chloride: 104 mmol/L (ref 96–106)
Creatinine, Ser: 1.22 mg/dL — ABNORMAL HIGH (ref 0.57–1.00)
GFR calc Af Amer: 61 mL/min/{1.73_m2} (ref >59)
GFR calc non Af Amer: 53 mL/min/{1.73_m2} — ABNORMAL LOW (ref >59)
Globulin, Total: 2.4 g/dL (ref 1.5–4.5)
Glucose: 73 mg/dL (ref 65–99)
Potassium: 4.5 mmol/L (ref 3.5–5.2)
Sodium: 139 mmol/L (ref 134–144)
Total Protein: 5.9 g/dL — ABNORMAL LOW (ref 6.0–8.5)

## 2018-11-29 LAB — CERVICOVAGINAL ANCILLARY ONLY
Bacterial Vaginitis (gardnerella): NEGATIVE
Candida Glabrata: NEGATIVE
Candida Vaginitis: NEGATIVE
Molecular Disclaimer: NEGATIVE
Molecular Disclaimer: NEGATIVE
Molecular Disclaimer: NEGATIVE
Molecular Disclaimer: NORMAL
Trichomonas: NEGATIVE

## 2018-11-30 MED FILL — CYCLOBENZAPRINE HCL 5 MG TA: 5 | 30 days supply | Qty: 30 | Fill #1

## 2018-11-30 MED FILL — FLUCONAZOLE 150 MG TABS: 150 | 2 days supply | Qty: 2 | Fill #1

## 2018-12-05 ENCOUNTER — Other Ambulatory Visit: Payer: Self-pay | Admitting: Family Medicine

## 2018-12-05 ENCOUNTER — Encounter: Payer: Self-pay | Admitting: Family Medicine

## 2018-12-05 DIAGNOSIS — R413 Other amnesia: Secondary | ICD-10-CM

## 2018-12-05 DIAGNOSIS — F4323 Adjustment disorder with mixed anxiety and depressed mood: Secondary | ICD-10-CM

## 2018-12-05 DIAGNOSIS — Z79899 Other long term (current) drug therapy: Secondary | ICD-10-CM

## 2018-12-16 MED FILL — AMITRIPTYLINE HCL 10 MG TAB: 10 | 30 days supply | Qty: 60 | Fill #2

## 2018-12-16 MED FILL — TOPIRAMATE 100 MG TABLET: 100 | 20 days supply | Qty: 30 | Fill #2

## 2018-12-16 MED FILL — XULANE PATCH: 150-35 | 63 days supply | Qty: 9 | Fill #0

## 2019-01-04 ENCOUNTER — Ambulatory Visit (INDEPENDENT_AMBULATORY_CARE_PROVIDER_SITE_OTHER): Payer: Medicare Other | Admitting: Registered Nurse

## 2019-01-04 ENCOUNTER — Other Ambulatory Visit: Payer: Self-pay

## 2019-01-04 ENCOUNTER — Encounter: Payer: Self-pay | Admitting: Registered Nurse

## 2019-01-04 ENCOUNTER — Encounter: Payer: Self-pay | Admitting: Family Medicine

## 2019-01-04 VITALS — BP 157/102 | HR 103 | Temp 99.1°F | Ht 68.0 in | Wt 189.2 lb

## 2019-01-04 DIAGNOSIS — R3915 Urgency of urination: Secondary | ICD-10-CM | POA: Diagnosis not present

## 2019-01-04 DIAGNOSIS — Z20828 Contact with and (suspected) exposure to other viral communicable diseases: Secondary | ICD-10-CM | POA: Diagnosis not present

## 2019-01-04 DIAGNOSIS — M545 Low back pain, unspecified: Secondary | ICD-10-CM

## 2019-01-04 DIAGNOSIS — Z20822 Contact with and (suspected) exposure to covid-19: Secondary | ICD-10-CM

## 2019-01-04 LAB — POCT URINALYSIS DIP (MANUAL ENTRY)
Bilirubin, UA: NEGATIVE
Glucose, UA: NEGATIVE mg/dL
Nitrite, UA: POSITIVE — AB
Spec Grav, UA: 1.02 (ref 1.010–1.025)
Urobilinogen, UA: 1 E.U./dL
pH, UA: 7 (ref 5.0–8.0)

## 2019-01-04 LAB — POCT CBC
Granulocyte percent: 60.9 %G (ref 37–80)
HCT, POC: 40.7 % (ref 29–41)
Hemoglobin: 13.4 g/dL (ref 11–14.6)
Lymph, poc: 2.8 (ref 0.6–3.4)
MCH, POC: 30.1 pg (ref 27–31.2)
MCHC: 32.9 g/dL (ref 31.8–35.4)
MCV: 91.5 fL (ref 76–111)
MID (cbc): 0.4 (ref 0–0.9)
MPV: 7.4 fL (ref 0–99.8)
POC Granulocyte: 4.9 (ref 2–6.9)
POC LYMPH PERCENT: 34.6 %L (ref 10–50)
POC MID %: 4.5 %M (ref 0–12)
Platelet Count, POC: 269 10*3/uL (ref 142–424)
RBC: 4.45 M/uL (ref 4.04–5.48)
RDW, POC: 12.7 %
WBC: 8.1 10*3/uL (ref 4.6–10.2)

## 2019-01-04 LAB — POC MICROSCOPIC URINALYSIS (UMFC): Mucus: ABSENT

## 2019-01-04 MED ORDER — LEVOFLOXACIN 750 MG PO TABS: 750.0000 mg | ORAL_TABLET | Freq: Every day | ORAL | 0 refills | Status: DC

## 2019-01-04 MED FILL — levoFLOXacin 750 MG TABS: 750 | 7 days supply | Qty: 7 | Fill #0

## 2019-01-04 NOTE — Patient Instructions (Signed)
° ° ° °  If you have lab work done today you will be contacted with your lab results within the next 2 weeks.  If you have not heard from us then please contact us. The fastest way to get your results is to register for My Chart. ° ° °IF you received an x-ray today, you will receive an invoice from [facility]. Please contact [facility] at [phone] with questions or concerns regarding your invoice.  ° °IF you received labwork today, you will receive an invoice from [facility]. Please contact [facility] at [phone] with questions or concerns regarding your invoice.  ° °Our billing staff will not be able to assist you with questions regarding bills from these companies. ° °You will be contacted with the lab results as soon as they are available. The fastest way to get your results is to activate your My Chart account. Instructions are located on the last page of this paperwork. If you have not heard from us regarding the results in 2 weeks, please contact this office. °  ° ° ° °

## 2019-01-04 NOTE — Progress Notes (Signed)
Acute Office Visit  Subjective:    Patient ID: Margaret Fletcher, female    DOB: 23-Dec-1970, 48 y.o.   MRN: [id number]  Chief Complaint  Patient presents with  . Urinary Frequency    going on 1 month   . Back Pain    Currently on AZO   . Urinary Urgency    feels like urinary trickling down. Cant hold it.   . Fatigue  . Chills    HPI Patient is in today for 1 mo symptoms of UTI Has been taking Azo trying to treat but symptoms have been progressive. Started with frequency, progressed to urgency, now some back and flank pain, fatigue, and chills. Has had sepsis before, afraid of this progressing to sepsis.  Has been taking OTC analgesics, unable to tell if she has had fever or not. BP elevated today, reassuring that she is not acutely septic at today's visit.  Has had UTIs before. No gross blood in stool. NVD at baseline. No cough, shob, chest pain, sensory changes - some concern for secondhand exposure to COVID. Will test today.   Past Medical History:  Diagnosis Date  . Alopecia   . Anxiety   . Autoimmune disease ([place])    "Alopecia"  . Bacterial vaginosis   . Cyst of spleen   . Depression   . Frequent UTI   . Hypertrophy of breast   . IIH (idiopathic intracranial hypertension) 2017   "related to headaches"  . Migraine headache    denies, ruled out - Propranolol.Using for headaches    Past Surgical History:  Procedure Laterality Date  . APPENDECTOMY    . BREAST EXCISIONAL BIOPSY Right 2017   removed at time of reduction  . BREAST REDUCTION SURGERY Bilateral 03/26/2015   Procedure: BILATERAL BREAST REDUCTION  ;  Surgeon: Youlanda Roys, MD;  Location: [facility];  Service: Plastics;  Laterality: Bilateral;  . BREATH TEK H PYLORI N/A 05/21/2014   Procedure: BREATH TEK H PYLORI;  Surgeon: [facility] Pickerel, MD;  Location: [facility];  Service: General;  Laterality: N/A;  . CELIAC PLEXUS BLOCK  07/18/2018   Per patient it was done at [facility]    . CESAREAN SECTION    . CHOLECYSTECTOMY    . DILITATION & CURRETTAGE/HYSTROSCOPY WITH NOVASURE ABLATION N/A 07/19/2012   Procedure: HYSTEROSCOPY WITH NOVASURE ABLATION;  Surgeon: Farrel Gobble. Harrington Challenger, MD;  Location: [facility];  Service: Gynecology;  Laterality: N/A;  . ENDOMETRIAL ABLATION    . ESOPHAGOGASTRODUODENOSCOPY N/A 08/16/2015   Procedure: UPPER ESOPHAGOGASTRODUODENOSCOPY (EGD);  Surgeon: [facility] Pickerel, MD;  Location: [facility];  Service: General;  Laterality: N/A;  . ESOPHAGOGASTRODUODENOSCOPY N/A 02/21/2016   Procedure: ESOPHAGOGASTRODUODENOSCOPY (EGD);  Surgeon: [facility] Pickerel, MD;  Location: [facility];  Service: General;  Laterality: N/A;  . ESOPHAGOGASTRODUODENOSCOPY N/A 02/24/2017   Procedure: ESOPHAGOGASTRODUODENOSCOPY (EGD);  Surgeon: [facility] Pickerel, MD;  Location: [facility];  Service: General;  Laterality: N/A;  . IR GENERIC HISTORICAL  10/04/2015   IR [place] Vivianne Master 10/04/2015 Markus Daft, MD [facility]  . IR GENERIC HISTORICAL  01/31/2016   IR [place] GASTRO/COLONIC TUBE PERCUT W/FLUORO 01/31/2016 Corrie Mckusick, DO [facility]  . IR GENERIC HISTORICAL  04/06/2016   IR [place] TUBE PERCUT W/FLUORO 04/06/2016 Arne Cleveland, MD [facility]  . IR GENERIC HISTORICAL  05/14/2016   [facility] GASTR[facility] TUBE PERC CHG W/O IMG GUIDE 05/14/2016 Sandi Mariscal, MD [facility]  . LAPAROSCOPIC GASTROSTOMY N/A 09/30/2015  Procedure: LAPAROSCOPIC GASTROSTOMY TUBE PLACEMENT;  Surgeon: [facility] Pickerel, MD;  Location: [facility];  Service: General;  Laterality: N/A;  . LAPAROSCOPIC ROUX-EN-Y GASTRIC BYPASS WITH HIATAL HERNIA REPAIR N/A 07/08/2015   Procedure: LAPAROSCOPIC ROUX-EN-Y GASTRIC BYPASS WITH HIATAL HERNIA REPAIR WITH UPPER ENDOSCOPY;  Surgeon: [facility] Pickerel, MD;  Location: [facility];  Service: General;  Laterality: N/A;  . LAPAROSCOPIC SMALL BOWEL RESECTION N/A 09/30/2015   Procedure: LAPAROSCOPIC REVISION OF ROUX LIMB;  Surgeon: [facility] Pickerel, MD;  Location: [facility];  Service: General;  Laterality: N/A;  . LAPAROSCOPY N/A 09/30/2015   Procedure: LAPAROSCOPY DIAGNOSTIC;  Surgeon: [facility] Pickerel, MD;  Location: [facility];  Service: General;  Laterality: N/A;  . REDUCTION MAMMAPLASTY Bilateral 2017  . TUBAL LIGATION    . WISDOM TOOTH EXTRACTION      Family History  Problem Relation Age of Onset  . Cancer Mother        late 78'-50  . Breast cancer Mother        late 61'-50  . Stroke Father   . Diabetes Brother   . Seizures Son   . Breast cancer Maternal Aunt        late 40'-50  . Breast cancer Maternal Aunt        late 40'-50  . Breast cancer Maternal Aunt        late 40'-50  . Migraines Neg Hx     Social History   Socioeconomic History  . Marital status: Divorced    Spouse name: Not on file  . Number of children: 2  . Years of education: 41  . Highest education level: Not on file  Occupational History  . 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 Financial resource strain: Not on file  . Food insecurity    Worry: Not on file    Inability: Not on file  . Transportation needs    Medical: Not on file    Non-medical: Not on file  Tobacco Use  . Smoking status: Never Smoker  . Smokeless tobacco: Never Used  Substance and Sexual Activity  . Alcohol use: Not Currently  . Drug use: No  . Sexual activity: Yes    Birth control/protection: Surgical  Lifestyle  . Physical activity    Days per week: Not on file    Minutes per session: Not on file  . Stress: Not on file  Relationships  . Social Herbalist on phone: Not on file    Gets together: Not on file    Attends religious service: Not on file    Active member of club or organization: Not on file    Attends meetings of clubs or organizations: Not on file    Relationship status: Not on file  . Intimate partner violence    Fear of current or ex partner: Not on file    Emotionally abused: Not on file    Physically abused: Not on file    Forced sexual  activity: Not on file  Other Topics Concern  . Not on file  Social History Narrative   Lives with partner   Caffeine use: minimal coffee   Right-handed    Outpatient Medications Prior to Visit  Medication Sig Dispense Refill  . amitriptyline (ELAVIL) 10 MG tablet TAKE 2 TABLETS BY MOUTH AT BEDTIME. 60 tablet 2  . amphetamine-dextroamphetamine (ADDERALL) 15 MG tablet TAKE 1 TABLET BY MOUTH 2 TIMES DAILY FOR 30 DAYS.(DNF 08/17/18) 60 tablet 0  .  BAYER MICROLET LANCETS lancets Use as instructed 100 each 12  . Buprenorphine HCl 300 MCG FILM Place 300 mcg inside cheek every 12 (twelve) hours. Can also take for breakthrough pain per patient    . CALCIUM-VITAMIN D PO Take 1 tablet by mouth 2 (two) times daily. chewable    . clonazePAM (KLONOPIN) 0.5 MG tablet TAKE 1 TABLET BY MOUTH DAILY AS NEEDED FOR ANXIETY. 30 tablet 0  . Cyanocobalamin (VITAMIN B-12 SL) Place 1 drop under the tongue daily. 1 dropperful    . cyclobenzaprine (FLEXERIL) 5 MG tablet TAKE 1 TABLET BY MOUTH 3 TIMES DAILY AS NEEDED FOR MUSCLE SPASMS. MAY TAKE 2 TABS BY MOUTH AT BEDTIME 40 tablet 2  . desvenlafaxine (PRISTIQ) 100 MG 24 hr tablet TAKE 1 TABLET BY MOUTH DAILY. 90 tablet 0  . docusate sodium (COLACE) 100 MG capsule Take 100 mg by mouth daily.    Marland Kitchen eletriptan (RELPAX) 40 MG tablet Take 1 tablet (40 mg total) by mouth every 2 (two) hours as needed for migraine or headache. Do not use >2 doses/24 hours 10 tablet 2  . glucose blood (CONTOUR NEXT TEST) test strip 1 each by Other route 4 (four) times daily as needed (hypoglycemia). K91.2 270 each 5  . lactulose (CHRONULAC) 10 GM/15ML solution Take 30 g by mouth daily as needed for moderate constipation.   0  . Multiple Vitamins-Minerals (BARIATRIC MULTIVITAMINS/IRON PO) Take 1 tablet by mouth 3 (three) times daily.    . Omega-3 Fatty Acids (OMEGA-3 FISH OIL PO) Take by mouth daily.    . ondansetron (ZOFRAN ODT) 4 MG disintegrating tablet Take 1 tablet (4 mg total) by mouth  every 8 (eight) hours as needed for nausea or vomiting. 20 tablet 0  . OVER THE COUNTER MEDICATION Meca root with xulane patch for hormone and premenopausal sx (wears all week).    Marland Kitchen OVER THE COUNTER MEDICATION Calcium, magnesium, zinc with D3 (one tablet daily)    . OVER THE COUNTER MEDICATION Collagen and vitamin c and biotin (takes one daily)    . OVER THE COUNTER MEDICATION Tunic for muscle and joint health    . polyethylene glycol (MIRALAX / GLYCOLAX) packet Take 17 g by mouth daily as needed (for constipation.).     Marland Kitchen PREMARIN vaginal cream     . simethicone (MYLICON) 80 MG chewable tablet Chew 80 mg by mouth every 6 (six) hours as needed for flatulence.    . topiramate (TOPAMAX) 100 MG tablet Take 1.5 tablets (150 mg total) by mouth daily. 30 tablet 2  . triamterene-hydrochlorothiazide (MAXZIDE-25) 37.5-25 MG tablet Take 1 tablet by mouth daily. 30 tablet 2  . albuterol (PROVENTIL HFA;VENTOLIN HFA) 108 (90 Base) MCG/ACT inhaler Inhale 2 puffs into the lungs every 6 (six) hours as needed for wheezing or shortness of breath. (Patient not taking: Reported on 01/04/2019) 1 Inhaler 0  . fluconazole (DIFLUCAN) 150 MG tablet     . omeprazole (PRILOSEC) 20 MG capsule Take 1 capsule (20 mg total) by mouth 2 (two) times daily before a meal for 14 days. 28 capsule 0  . XULANE 150-35 MCG/24HR transdermal patch      No facility-administered medications prior to visit.     Allergies  Allergen Reactions  . Sulfa Antibiotics Anaphylaxis  . Metoclopramide Other (See Comments)    Elevated prolactin >5x ULN induction lactation when taking scheduled for several months  . Zosyn [Piperacillin Sod-Tazobactam So] Rash    Has patient had a PCN reaction causing immediate  rash, facial/tongue/throat swelling, SOB or lightheadedness with hypotension: No Has patient had a PCN reaction causing severe rash involving mucus membranes or skin necrosis: No Has patient had a PCN reaction that required hospitalization:  Unknown--inpatient when reaction occurred Has patient had a PCN reaction occurring within the last 10 years: Yes If all of the above answers are "NO", then may proceed with Cephalosporin use.     Review of Systems  Constitutional: Positive for chills, fever (?) and malaise/fatigue. Negative for diaphoresis and weight loss.  HENT: Negative.   Eyes: Negative.   Respiratory: Negative.   Cardiovascular: Negative.   Gastrointestinal: Negative.   Genitourinary: Positive for dysuria, flank pain, frequency and urgency.  Musculoskeletal: Positive for back pain.  Skin: Negative for itching and rash.  Neurological: Negative.   Endo/Heme/Allergies: Negative.   Psychiatric/Behavioral: Negative.   All other systems reviewed and are negative.      Objective:    Physical Exam  Constitutional: She is oriented to person, place, and time. She appears well-developed and well-nourished.  Cardiovascular: Normal rate and regular rhythm.  Pulmonary/Chest: Effort normal. No respiratory distress.  Neurological: She is alert and oriented to person, place, and time.  Skin: Skin is warm and dry. No rash noted. No pallor.  Psychiatric: She has a normal mood and affect. Her behavior is normal. Judgment and thought content normal.  Nursing note and vitals reviewed. Pt grimacing in discomfort throughout visit. CVA tenderness.   UA dip done on site, results in chart. Hematuria, ketones, leuks and nitrites noted.   BP (!) 157/102 (BP Location: Right Arm, Patient Position: Sitting, Cuff Size: Large)   Pulse (!) 103   Temp 99.1 F (37.3 C) (Oral)   Ht 5\' 8"  (1.727 m)   Wt 189 lb 3.2 oz (85.8 kg)   SpO2 99%   BMI 28.77 kg/m  Wt Readings from Last 3 Encounters:  01/04/19 189 lb 3.2 oz (85.8 kg)  11/28/18 204 lb 12.8 oz (92.9 kg)  10/14/18 190 lb (86.2 kg)    There are no preventive care reminders to display for this patient.  There are no preventive care reminders to display for this patient.   Lab  Results  Component Value Date   TSH 1.210 04/28/2017   Lab Results  Component Value Date   WBC 8.1 01/04/2019   HGB 13.4 01/04/2019   HCT 40.7 01/04/2019   MCV 91.5 01/04/2019   PLT 273 03/22/2018   Lab Results  Component Value Date   NA 139 11/28/2018   K 4.5 11/28/2018   CO2 22 11/28/2018   GLUCOSE 73 11/28/2018   BUN 25 (H) 11/28/2018   CREATININE 1.22 (H) 11/28/2018   BILITOT 0.3 11/28/2018   ALKPHOS 71 11/28/2018   AST 24 11/28/2018   ALT 11 11/28/2018   PROT 5.9 (L) 11/28/2018   ALBUMIN 3.5 (L) 11/28/2018   CALCIUM 8.5 (L) 11/28/2018   ANIONGAP 7 03/22/2018   No results found for: CHOL No results found for: HDL No results found for: LDLCALC No results found for: TRIG No results found for: CHOLHDL Lab Results  Component Value Date   HGBA1C 5.3 03/16/2016       Assessment & Plan:   Problem List Items Addressed This Visit    None    Visit Diagnoses    Urinary urgency    -  Primary   Relevant Medications   levofloxacin (LEVAQUIN) 750 MG tablet   Other Relevant Orders   POCT urinalysis dipstick (Completed)  POCT Microscopic Urinalysis ([facility]) (Completed)   Urine Culture   Low back pain without sciatica, unspecified back pain laterality, unspecified chronicity       Relevant Orders   POCT urinalysis dipstick (Completed)   POCT Microscopic Urinalysis ([facility]) (Completed)   Urine Culture   POCT CBC (Completed)   Encounter for screening laboratory testing for COVID-19 virus       Relevant Orders   Novel Coronavirus, NAA ([facility])       Meds ordered this encounter  Medications  . levofloxacin (LEVAQUIN) 750 MG tablet    Sig: Take 1 tablet (750 mg total) by mouth daily.    Dispense:  7 tablet    Refill:  0    Order Specific Question:   Supervising Provider    Answer:   Delia Chimes A [id number]   PLAN  Levofloxacin 750mg  PO qd for 7 days.  POCT CBC today.   48 hour follow up in office on Friday - will repeat CBC  Reviewed in depth the  symptoms of sepsis or progressing pyelonephritis and reasons to proceed to ED. Patient demonstrates understanding.  Urine culture sent out.  Patient encouraged to call clinic with any questions, comments, or concerns.    Maximiano Coss, NP

## 2019-01-05 LAB — NOVEL CORONAVIRUS, NAA: SARS-CoV-2, NAA: NOT DETECTED

## 2019-01-05 LAB — URINE CULTURE: Organism ID, Bacteria: NO GROWTH

## 2019-01-05 NOTE — Telephone Encounter (Signed)
F/u call to pt.  States she is feeling "about the same" as yesterday.  Has been taking ABX.  Pt has questions re if family members are still contagious who have tested positive for COVID.  Advised that u/a to determine patient's current COVID status as has been very different for each patient.   Pt will f/u with Rich.

## 2019-01-06 ENCOUNTER — Ambulatory Visit (INDEPENDENT_AMBULATORY_CARE_PROVIDER_SITE_OTHER): Payer: Medicare Other | Admitting: Registered Nurse

## 2019-01-06 ENCOUNTER — Other Ambulatory Visit: Payer: Self-pay

## 2019-01-06 ENCOUNTER — Encounter: Payer: Self-pay | Admitting: Registered Nurse

## 2019-01-06 VITALS — BP 116/78 | HR 100 | Temp 98.8°F | Resp 16 | Ht 68.0 in | Wt 191.0 lb

## 2019-01-06 DIAGNOSIS — N898 Other specified noninflammatory disorders of vagina: Secondary | ICD-10-CM | POA: Diagnosis not present

## 2019-01-06 DIAGNOSIS — R3915 Urgency of urination: Secondary | ICD-10-CM

## 2019-01-06 DIAGNOSIS — R110 Nausea: Secondary | ICD-10-CM

## 2019-01-06 LAB — POCT URINALYSIS DIP (MANUAL ENTRY)
Bilirubin, UA: NEGATIVE
Blood, UA: NEGATIVE
Glucose, UA: NEGATIVE mg/dL
Ketones, POC UA: NEGATIVE mg/dL
Leukocytes, UA: NEGATIVE
Nitrite, UA: POSITIVE — AB
Protein Ur, POC: NEGATIVE mg/dL
Spec Grav, UA: >=1.03 — AB (ref 1.010–1.025)
Urobilinogen, UA: 0.2 E.U./dL
pH, UA: 5 (ref 5.0–8.0)

## 2019-01-06 LAB — POCT CBC
Granulocyte percent: 59.1 %G (ref 37–80)
HCT, POC: 38.2 % (ref 29–41)
Hemoglobin: 12.7 g/dL (ref 11–14.6)
Lymph, poc: 3.5 — AB (ref 0.6–3.4)
MCH, POC: 30.8 pg (ref 27–31.2)
MCHC: 33.2 g/dL (ref 31.8–35.4)
MCV: 92.8 fL (ref 76–111)
MID (cbc): 0.5 (ref 0–0.9)
MPV: 7.2 fL (ref 0–99.8)
POC Granulocyte: 5.8 (ref 2–6.9)
POC LYMPH PERCENT: 35.3 %L (ref 10–50)
POC MID %: 5.6 %M (ref 0–12)
Platelet Count, POC: 281 10*3/uL (ref 142–424)
RBC: 4.11 M/uL (ref 4.04–5.48)
RDW, POC: 12.9 %
WBC: 9.8 10*3/uL (ref 4.6–10.2)

## 2019-01-06 MED ORDER — ONDANSETRON 4 MG PO TBDP: 4.0000 mg | ORAL_TABLET | Freq: Three times a day (TID) | ORAL | 0 refills | Status: DC | PRN

## 2019-01-06 MED ORDER — FLUCONAZOLE 150 MG PO TABS: 150.0000 mg | ORAL_TABLET | Freq: Once | ORAL | 0 refills | Status: AC

## 2019-01-06 MED FILL — FLUCONAZOLE 150 MG TABS: 150 | 1 days supply | Qty: 1 | Fill #0

## 2019-01-06 MED FILL — ONDANSETRON ODT 4 MG TABLET: 4 | 6 days supply | Qty: 20 | Fill #0

## 2019-01-06 NOTE — Progress Notes (Signed)
Established Patient Office Visit  Subjective:  Patient ID: Margaret Fletcher, female    DOB: May 28, 1970  Age: 48 y.o. MRN: [id number]  CC:  Chief Complaint  Patient presents with  . Urinary Tract Infection    follow up/ back pain    HPI Margaret Fletcher presents for UTI follow up  Reports symptoms have overall improved, but flank pain is still present. Has concern because she had a relative pass away yesterday due to Regino Ramirez. She wants to make sure she is as safe as possible through any celebration of life. Negative COVID test Wednesday. Today also requests refills of flucanazole and ondansetron.  Past Medical History:  Diagnosis Date  . Alopecia   . Anxiety   . Autoimmune disease ([place])    "Alopecia"  . Bacterial vaginosis   . Cyst of spleen   . Depression   . Frequent UTI   . Hypertrophy of breast   . IIH (idiopathic intracranial hypertension) 2017   "related to headaches"  . Migraine headache    denies, ruled out - Propranolol.Using for headaches    Past Surgical History:  Procedure Laterality Date  . APPENDECTOMY    . BREAST EXCISIONAL BIOPSY Right 2017   removed at time of reduction  . BREAST REDUCTION SURGERY Bilateral 03/26/2015   Procedure: BILATERAL BREAST REDUCTION  ;  Surgeon: Youlanda Roys, MD;  Location: [facility];  Service: Plastics;  Laterality: Bilateral;  . BREATH TEK H PYLORI N/A 05/21/2014   Procedure: BREATH TEK H PYLORI;  Surgeon: Greer Pickerel, MD;  Location: [facility];  Service: General;  Laterality: N/A;  . CELIAC PLEXUS BLOCK  07/18/2018   Per patient it was done at [facility]    . CESAREAN SECTION    . CHOLECYSTECTOMY    . DILITATION & CURRETTAGE/HYSTROSCOPY WITH NOVASURE ABLATION N/A 07/19/2012   Procedure: HYSTEROSCOPY WITH NOVASURE ABLATION;  Surgeon: Farrel Gobble. Harrington Challenger, MD;  Location: [facility];  Service: Gynecology;  Laterality: N/A;  . ENDOMETRIAL ABLATION    . ESOPHAGOGASTRODUODENOSCOPY N/A  08/16/2015   Procedure: UPPER ESOPHAGOGASTRODUODENOSCOPY (EGD);  Surgeon: Greer Pickerel, MD;  Location: [facility];  Service: General;  Laterality: N/A;  . ESOPHAGOGASTRODUODENOSCOPY N/A 02/21/2016   Procedure: ESOPHAGOGASTRODUODENOSCOPY (EGD);  Surgeon: Greer Pickerel, MD;  Location: [facility];  Service: General;  Laterality: N/A;  . ESOPHAGOGASTRODUODENOSCOPY N/A 02/24/2017   Procedure: ESOPHAGOGASTRODUODENOSCOPY (EGD);  Surgeon: Greer Pickerel, MD;  Location: [facility];  Service: General;  Laterality: N/A;  . IR GENERIC HISTORICAL  10/04/2015   IR [place] Vivianne Master 10/04/2015 Markus Daft, MD [facility]  . IR GENERIC HISTORICAL  01/31/2016   IR [place] GASTRO/COLONIC TUBE PERCUT W/FLUORO 01/31/2016 Corrie Mckusick, DO [facility]  . IR GENERIC HISTORICAL  04/06/2016   IR [place] TUBE PERCUT W/FLUORO 04/06/2016 Arne Cleveland, MD [facility]  . IR GENERIC HISTORICAL  05/14/2016   IR GASTRIC TUBE PERC CHG W/O IMG GUIDE 05/14/2016 Sandi Mariscal, MD [facility]  . LAPAROSCOPIC GASTROSTOMY N/A 09/30/2015   Procedure: LAPAROSCOPIC GASTROSTOMY TUBE PLACEMENT;  Surgeon: Greer Pickerel, MD;  Location: [facility];  Service: General;  Laterality: N/A;  . LAPAROSCOPIC ROUX-EN-Y GASTRIC BYPASS WITH HIATAL HERNIA REPAIR N/A 07/08/2015   Procedure: LAPAROSCOPIC ROUX-EN-Y GASTRIC BYPASS WITH HIATAL HERNIA REPAIR WITH UPPER ENDOSCOPY;  Surgeon: Greer Pickerel, MD;  Location: [facility];  Service: General;  Laterality: N/A;  . LAPAROSCOPIC SMALL BOWEL RESECTION N/A 09/30/2015   Procedure: LAPAROSCOPIC REVISION OF ROUX LIMB;  Surgeon: Greer Pickerel, MD;  Location: [facility];  Service: General;  Laterality: N/A;  . LAPAROSCOPY N/A 09/30/2015   Procedure: LAPAROSCOPY DIAGNOSTIC;  Surgeon: Greer Pickerel, MD;  Location: [facility];  Service: General;  Laterality: N/A;  . REDUCTION MAMMAPLASTY Bilateral 2017  . TUBAL LIGATION    . WISDOM TOOTH EXTRACTION      Family History  Problem Relation Age of Onset  .  Cancer Mother        late 20'-50  . Breast cancer Mother        late 63'-50  . Stroke Father   . Diabetes Brother   . Seizures Son   . Breast cancer Maternal Aunt        late 40'-50  . Breast cancer Maternal Aunt        late 40'-50  . Breast cancer Maternal Aunt        late 40'-50  . Migraines Neg Hx     Social History   Socioeconomic History  . Marital status: Divorced    Spouse name: Not on file  . Number of children: 2  . Years of education: 37  . Highest education level: Not on file  Occupational History  . 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 Financial resource strain: Not on file  . Food insecurity    Worry: Not on file    Inability: Not on file  . Transportation needs    Medical: Not on file    Non-medical: Not on file  Tobacco Use  . Smoking status: Never Smoker  . Smokeless tobacco: Never Used  Substance and Sexual Activity  . Alcohol use: Not Currently  . Drug use: No  . Sexual activity: Yes    Birth control/protection: Surgical  Lifestyle  . Physical activity    Days per week: Not on file    Minutes per session: Not on file  . Stress: Not on file  Relationships  . Social Herbalist on phone: Not on file    Gets together: Not on file    Attends religious service: Not on file    Active member of club or organization: Not on file    Attends meetings of clubs or organizations: Not on file    Relationship status: Not on file  . Intimate partner violence    Fear of current or ex partner: Not on file    Emotionally abused: Not on file    Physically abused: Not on file    Forced sexual activity: Not on file  Other Topics Concern  . Not on file  Social History Narrative   Lives with partner   Caffeine use: minimal coffee   Right-handed    Outpatient Medications Prior to Visit  Medication Sig Dispense Refill  . albuterol (PROVENTIL HFA;VENTOLIN HFA) 108 (90 Base) MCG/ACT inhaler Inhale 2 puffs into the lungs every 6  (six) hours as needed for wheezing or shortness of breath. (Patient not taking: Reported on 01/04/2019) 1 Inhaler 0  . amitriptyline (ELAVIL) 10 MG tablet TAKE 2 TABLETS BY MOUTH AT BEDTIME. 60 tablet 2  . amphetamine-dextroamphetamine (ADDERALL) 15 MG tablet TAKE 1 TABLET BY MOUTH 2 TIMES DAILY FOR 30 DAYS.(DNF 08/17/18) 60 tablet 0  . BAYER MICROLET LANCETS lancets Use as instructed 100 each 12  . Buprenorphine HCl 300 MCG FILM Place 300 mcg inside cheek every 12 (twelve) hours. Can also take for breakthrough pain per patient    . CALCIUM-VITAMIN D  PO Take 1 tablet by mouth 2 (two) times daily. chewable    . clonazePAM (KLONOPIN) 0.5 MG tablet TAKE 1 TABLET BY MOUTH DAILY AS NEEDED FOR ANXIETY. 30 tablet 0  . Cyanocobalamin (VITAMIN B-12 SL) Place 1 drop under the tongue daily. 1 dropperful    . cyclobenzaprine (FLEXERIL) 5 MG tablet TAKE 1 TABLET BY MOUTH 3 TIMES DAILY AS NEEDED FOR MUSCLE SPASMS. MAY TAKE 2 TABS BY MOUTH AT BEDTIME 40 tablet 2  . desvenlafaxine (PRISTIQ) 100 MG 24 hr tablet TAKE 1 TABLET BY MOUTH DAILY. 90 tablet 0  . docusate sodium (COLACE) 100 MG capsule Take 100 mg by mouth daily.    Marland Kitchen eletriptan (RELPAX) 40 MG tablet Take 1 tablet (40 mg total) by mouth every 2 (two) hours as needed for migraine or headache. Do not use >2 doses/24 hours 10 tablet 2  . glucose blood (CONTOUR NEXT TEST) test strip 1 each by Other route 4 (four) times daily as needed (hypoglycemia). K91.2 270 each 5  . lactulose (CHRONULAC) 10 GM/15ML solution Take 30 g by mouth daily as needed for moderate constipation.   0  . levofloxacin (LEVAQUIN) 750 MG tablet Take 1 tablet (750 mg total) by mouth daily. 7 tablet 0  . Multiple Vitamins-Minerals (BARIATRIC MULTIVITAMINS/IRON PO) Take 1 tablet by mouth 3 (three) times daily.    . Omega-3 Fatty Acids (OMEGA-3 FISH OIL PO) Take by mouth daily.    Marland Kitchen omeprazole (PRILOSEC) 20 MG capsule Take 1 capsule (20 mg total) by mouth 2 (two) times daily before a meal for  14 days. 28 capsule 0  . OVER THE COUNTER MEDICATION Meca root with xulane patch for hormone and premenopausal sx (wears all week).    Marland Kitchen OVER THE COUNTER MEDICATION Calcium, magnesium, zinc with D3 (one tablet daily)    . OVER THE COUNTER MEDICATION Collagen and vitamin c and biotin (takes one daily)    . OVER THE COUNTER MEDICATION Tunic for muscle and joint health    . polyethylene glycol (MIRALAX / GLYCOLAX) packet Take 17 g by mouth daily as needed (for constipation.).     Marland Kitchen PREMARIN vaginal cream     . simethicone (MYLICON) 80 MG chewable tablet Chew 80 mg by mouth every 6 (six) hours as needed for flatulence.    . topiramate (TOPAMAX) 100 MG tablet Take 1.5 tablets (150 mg total) by mouth daily. 30 tablet 2  . triamterene-hydrochlorothiazide (MAXZIDE-25) 37.5-25 MG tablet Take 1 tablet by mouth daily. 30 tablet 2  . XULANE 150-35 MCG/24HR transdermal patch     . fluconazole (DIFLUCAN) 150 MG tablet     . ondansetron (ZOFRAN ODT) 4 MG disintegrating tablet Take 1 tablet (4 mg total) by mouth every 8 (eight) hours as needed for nausea or vomiting. 20 tablet 0   No facility-administered medications prior to visit.     Allergies  Allergen Reactions  . Sulfa Antibiotics Anaphylaxis  . Metoclopramide Other (See Comments)    Elevated prolactin >5x ULN induction lactation when taking scheduled for several months  . Zosyn [Piperacillin Sod-Tazobactam So] Rash    Has patient had a PCN reaction causing immediate rash, facial/tongue/throat swelling, SOB or lightheadedness with hypotension: No Has patient had a PCN reaction causing severe rash involving mucus membranes or skin necrosis: No Has patient had a PCN reaction that required hospitalization: Unknown--inpatient when reaction occurred Has patient had a PCN reaction occurring within the last 10 years: Yes If all of the above answers are "NO",  then may proceed with Cephalosporin use.     ROS Review of Systems  Constitutional: Negative.    Respiratory: Negative.   Cardiovascular: Negative.   Endocrine: Negative.   Genitourinary: Positive for flank pain. Negative for difficulty urinating, dysuria, frequency and pelvic pain.      Objective:    Physical Exam  Constitutional: She is oriented to person, place, and time. She appears well-developed and well-nourished. No distress.  Cardiovascular: Normal rate and regular rhythm.  Pulmonary/Chest: Effort normal. No respiratory distress.  Neurological: She is alert and oriented to person, place, and time.  Skin: Skin is warm and dry. No rash noted. She is not diaphoretic. No erythema. No pallor.  Psychiatric: Her behavior is normal. Judgment and thought content normal. Her mood appears anxious. Her affect is not angry, not blunt, not labile and not inappropriate. She does not exhibit a depressed mood.  Pt teary at times given recent loss of relative.  Nursing note and vitals reviewed.   BP 116/78   Pulse 100   Temp 98.8 F (37.1 C) (Oral)   Resp 16   Ht 5\' 8"  (1.727 m)   Wt 191 lb (86.6 kg)   SpO2 98%   BMI 29.04 kg/m  Wt Readings from Last 3 Encounters:  01/06/19 191 lb (86.6 kg)  01/04/19 189 lb 3.2 oz (85.8 kg)  11/28/18 204 lb 12.8 oz (92.9 kg)     There are no preventive care reminders to display for this patient.  There are no preventive care reminders to display for this patient.  Lab Results  Component Value Date   TSH 1.210 04/28/2017   Lab Results  Component Value Date   WBC 9.8 01/06/2019   HGB 12.7 01/06/2019   HCT 38.2 01/06/2019   MCV 92.8 01/06/2019   PLT 273 03/22/2018   Lab Results  Component Value Date   NA 139 11/28/2018   K 4.5 11/28/2018   CO2 22 11/28/2018   GLUCOSE 73 11/28/2018   BUN 25 (H) 11/28/2018   CREATININE 1.22 (H) 11/28/2018   BILITOT 0.3 11/28/2018   ALKPHOS 71 11/28/2018   AST 24 11/28/2018   ALT 11 11/28/2018   PROT 5.9 (L) 11/28/2018   ALBUMIN 3.5 (L) 11/28/2018   CALCIUM 8.5 (L) 11/28/2018   ANIONGAP 7  03/22/2018   No results found for: CHOL No results found for: HDL No results found for: LDLCALC No results found for: TRIG No results found for: CHOLHDL Lab Results  Component Value Date   HGBA1C 5.3 03/16/2016      Assessment & Plan:   Problem List Items Addressed This Visit      Other   Chronic nausea (Chronic)   Relevant Medications   ondansetron (ZOFRAN ODT) 4 MG disintegrating tablet    Other Visit Diagnoses    Urinary urgency    -  Primary   Relevant Orders   POCT CBC (Completed)   POCT urinalysis dipstick (Completed)   Vaginal discharge       Relevant Medications   fluconazole (DIFLUCAN) 150 MG tablet      Meds ordered this encounter  Medications  . fluconazole (DIFLUCAN) 150 MG tablet    Sig: Take 1 tablet (150 mg total) by mouth once for 1 dose.    Dispense:  1 tablet    Refill:  0    Order Specific Question:   Supervising Provider    Answer:   Delia Chimes A [id number]  . ondansetron (ZOFRAN ODT) 4 MG disintegrating  tablet    Sig: Take 1 tablet (4 mg total) by mouth every 8 (eight) hours as needed for nausea or vomiting.    Dispense:  20 tablet    Refill:  0    Order Specific Question:   Supervising Provider    Answer:   Forrest Moron [id number]    Follow-up: Return if symptoms worsen or fail to improve, for may have virtual visit next Mon-Tues if symptoms aren't resolved.Marland Kitchen    PLAN  Continue levaquin. Finish entire course.  CBC shows increase in WBC from 8.1 to 9.7, however, ua dip shows improvement. With clinical improvement, we are reassured.  Again reviewed sxs of sepsis, reasons to present to ED  Appt on Monday or Tuesday if symptoms are not resolved  Patient encouraged to call clinic with any questions, comments, or concerns.   Maximiano Coss, NP

## 2019-01-06 NOTE — Patient Instructions (Signed)
° ° ° °  If you have lab work done today you will be contacted with your lab results within the next 2 weeks.  If you have not heard from us then please contact us. The fastest way to get your results is to register for My Chart. ° ° °IF you received an x-ray today, you will receive an invoice from [facility]. Please contact [facility] at [phone] with questions or concerns regarding your invoice.  ° °IF you received labwork today, you will receive an invoice from [facility]. Please contact [facility] at [phone] with questions or concerns regarding your invoice.  ° °Our billing staff will not be able to assist you with questions regarding bills from these companies. ° °You will be contacted with the lab results as soon as they are available. The fastest way to get your results is to activate your My Chart account. Instructions are located on the last page of this paperwork. If you have not heard from us regarding the results in 2 weeks, please contact this office. °  ° ° ° °

## 2019-01-09 ENCOUNTER — Other Ambulatory Visit: Payer: Self-pay | Admitting: Family Medicine

## 2019-01-09 ENCOUNTER — Other Ambulatory Visit: Payer: Self-pay | Admitting: Registered Nurse

## 2019-01-09 ENCOUNTER — Encounter: Payer: Self-pay | Admitting: Registered Nurse

## 2019-01-09 ENCOUNTER — Encounter: Payer: Self-pay | Admitting: Family Medicine

## 2019-01-09 ENCOUNTER — Ambulatory Visit (INDEPENDENT_AMBULATORY_CARE_PROVIDER_SITE_OTHER): Payer: Medicare Other | Admitting: Family Medicine

## 2019-01-09 ENCOUNTER — Other Ambulatory Visit: Payer: Self-pay

## 2019-01-09 VITALS — BP 101/68 | HR 82 | Temp 98.3°F | Ht 68.0 in | Wt 193.8 lb

## 2019-01-09 DIAGNOSIS — B370 Candidal stomatitis: Secondary | ICD-10-CM

## 2019-01-09 DIAGNOSIS — Z8669 Personal history of other diseases of the nervous system and sense organs: Secondary | ICD-10-CM

## 2019-01-09 DIAGNOSIS — G43709 Chronic migraine without aura, not intractable, without status migrainosus: Secondary | ICD-10-CM

## 2019-01-09 MED ORDER — TOPIRAMATE 100 MG PO TABS: 150.0000 mg | ORAL_TABLET | Freq: Every day | ORAL | 1 refills | Status: DC

## 2019-01-09 MED ORDER — ELETRIPTAN HYDROBROMIDE 40 MG PO TABS: 40.0000 mg | ORAL_TABLET | ORAL | 5 refills | Status: DC | PRN

## 2019-01-09 MED ORDER — NYSTATIN 100000 UNIT/ML MT SUSP: 5.0000 mL | Freq: Four times a day (QID) | OROMUCOSAL | 0 refills | Status: DC

## 2019-01-09 MED FILL — FLUCONAZOLE 150 MG TABS: 150 | 1 days supply | Qty: 1 | Fill #0

## 2019-01-09 MED FILL — BELBUCA 450 MCG FILM: 450 | 30 days supply | Qty: 60 | Fill #1

## 2019-01-09 MED FILL — AMITRIPTYLINE HCL 10 MG TAB: 10 | 30 days supply | Qty: 60 | Fill #2

## 2019-01-09 MED FILL — NYSTATIN 100,000 UNITS/ML S: 100000 | 23 days supply | Qty: 473 | Fill #0

## 2019-01-09 MED FILL — TRIAMTERENE-HCTZ 37.5-25 MG: 37.5-25 | 30 days supply | Qty: 30 | Fill #1

## 2019-01-09 MED FILL — TOPIRAMATE 100 MG TABLET: 100 | 90 days supply | Qty: 135 | Fill #0

## 2019-01-09 MED FILL — ONDANSETRON ODT 4 MG TABLET: 4 | 6 days supply | Qty: 20 | Fill #0

## 2019-01-09 NOTE — Telephone Encounter (Signed)
Requested medication (s) are due for refill today: yes  Requested medication (s) are on the active medication list: yes  Last refill:  11/18/2018  Future visit scheduled: yes  Notes to clinic:  Refill cannot be delegated    Requested Prescriptions  Pending Prescriptions Disp Refills   amphetamine-dextroamphetamine (ADDERALL) 15 MG tablet [Pharmacy Med Name: AMPHETAMINE-DEXTROAMPHETAMI 15 Tablet] 60 tablet 0    Sig: TAKE 1 TABLET BY MOUTH TWO TIMES DAILY     Not Delegated - Psychiatry:  Stimulants/ADHD Failed - 01/09/2019 11:29 AM      Failed - This refill cannot be delegated      Failed - Urine Drug Screen completed in last 360 days.      Passed - Valid encounter within last 3 months    Recent Outpatient Visits          3 days ago Urinary urgency   [facility], Delfino Lovett, NP   5 days ago Urinary urgency   [facility], [place], NP   1 month ago Essential hypertension, benign   [facility], Lilia Argue, MD   2 months ago Elevated BP without diagnosis of hypertension   [facility], Lilia Argue, MD   2 months ago Close Exposure to Covid-19 Virus   [facility] at [facility], Ines Bloomer, MD      Future Appointments            In 1 month Pamella Pert, Lilia Argue, MD [facility] at [place], [facility]

## 2019-01-09 NOTE — Telephone Encounter (Signed)
Requested medication (s) are due for refill today: yes  Requested medication (s) are on the active medication list: yes  Last refill: 11/23/2018  Future visit scheduled: yes  Notes to clinic:  Refill cannot be delegated   Requested Prescriptions  Pending Prescriptions Disp Refills   clonazePAM (KLONOPIN) 0.5 MG tablet [Pharmacy Med Name: clonazePAM 0.5 MG TABS 0.5 Tablet] 30 tablet 0    Sig: TAKE 1 TABLET BY MOUTH ONCE A DAY AS NEEDED FOR ANXIETY     Not Delegated - Psychiatry:  Anxiolytics/Hypnotics Failed - 01/09/2019 11:29 AM      Failed - This refill cannot be delegated      Failed - Urine Drug Screen completed in last 360 days.      Passed - Valid encounter within last 6 months    Recent Outpatient Visits          3 days ago Urinary urgency   [facility], Delfino Lovett, NP   5 days ago Urinary urgency   [facility], [place], NP   1 month ago Essential hypertension, benign   [facility], Lilia Argue, MD   2 months ago Elevated BP without diagnosis of hypertension   [facility], Lilia Argue, MD   2 months ago Close Exposure to Covid-19 Virus   [facility] at [facility], Ines Bloomer, MD      Future Appointments            In 1 month Pamella Pert, Lilia Argue, MD Primary Care at [place], [facility]

## 2019-01-09 NOTE — Progress Notes (Addendum)
PATIENT: Margaret Fletcher DOB: 17-Apr-1970  REASON FOR VISIT: follow up HISTORY FROM: patient  Chief Complaint  Patient presents with   Follow-up    Room 1, alone. Still having headaches.      HISTORY OF PRESENT ILLNESS: Today 01/09/19 Margaret Fletcher is a 48 y.o. female here today for follow up. She continues topiramate 150mg  daily as well as amitriptyline 10mg  at night for migraine prevention. She reports that headaches are now occurring about 2-3 times a week. She continues to have about 2 migraines a week. She is going through a full prescription of Relpax each month. She has weaned Excedrin and is not taking any other OTC medications for headaches. She reports that eye exam this year was normal. I will request report today. She denies vision changes. She was started on Maxide for BP management and feels that this has helped. She continues Buprenorphine film 300mg  every 12 hours for pain and clonazepam 0.5mg  daily for anxiety. She continues Pristiq for mood. She reports that her PCP left the practice. She has not been able to get refills of her Adderall. She was referred to psychiatry for continued management. She is on Xulane patch for hot flashes and dysmenorrhea. She has an allergy to sulfa and cannot take Diamox.   HISTORY: (copied from my note on 10/06/2018)  Margaret Fletcher is a 48 y.o. female here today for follow up for migraines. She has not been seen since 02/2017 due to loss of insurance coverage. She has recently been approved for disability.   She has had increased frequency of headaches and migraines. She has history of IIH and Adie's eye. Headaches are daily with migraines occurring 2-3 times a week. She has noted a runny nose with headaches and feels pressure behind her eyes. She increased her dose of topiramate from 75mg  to 100mg  "a while ago." Last RX written from Dr Pamella Pert was for 45 tablets and 2 refills. She reports that she would get refills early. She  is also taking amitriptyline 20mg . She is using a full rx of Relpax every month, sometimes in the first three weeks. Last rx written 03/23/2018 for 10 tablets with 2 refills. She very rarely uses Excedrin, only if she has to. She reports recent eye exam was normal with no concerns of optic nerve swelling.   She is s/p bypass surgery. 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 She is taking buprenorphine 398mcg daily. She is taking clonazepam 0.5mg  daily. Pristiq for depression.   She does not feel her BP has been elevated in the past. She notes elevation today but feels it is related to pain. She has noted dizziness and vertiginous symptoms. Dizziness sometimes when standing up. She reports being well hydrated. She reports concerns of hypoglycemia in absence of diagnosis of diabetes. Readings fluctuate between 50-160.    HISTORY: (copied from [place] note on 02/22/2017)  Today12/17/18]Margaret Fletcher is a 48 year old female with a history of Adie's eyeand migraine headaches. She returns today for follow-up. She states in November she developed a severe headache. She states that it was accompanied by heaviness in the right arm and in the legs. She states that she had trouble holding things in the right hand. She went to the emergency room and diagnosed with a complex migraine. She reports that once her headache was better theother symptoms improved as well. She states that since her visit to  the ED in November she has essentially has had a headache daily. She reports that some days her headaches are worse than others. Had a sleep study in the past that did not show sleep apnea. She is currently on Topamax 50 mg at bedtime and amitriptyline 20 mg at bedtime. She reports that her primary care recently put her on Pristiq for depression. She continues to use Relpax for her headaches. In the past  the patient's topamaxdose was decreased due to worsening fatigue,depression and memory problems. She does feel that since her Topamax dose has been decreased her headaches have gotten worse. She remains on amitriptyline 20 mg at bedtime. She does report that it makes her feel very groggy the next morning. She returns today for an evaluation.  HISTORY7/9/18:Margaret L Hayesis a 48 y.o.femalehere as a referral from Dr. Brain Hilts dilated pupil. She was diagnosed with Adie's eye. She had a headache, some headache, ear congestion and then noticed that her pupil was dilated. Her eye gets sore even now. She has lost 120 pounds. Her headaches are better. It is hard to eat. She was denied for disability. Itis in appeal. She applied for medicaid. She wears sunglasses. She is having blurry vision in the eye. The left eye is blurry. She was evaluated by ophthalmology. Going outside is hard. No improvement. She takes 150mg  Topiramate daily. She is still having headaches every day, she takes eletriptan, She takes the eletriptan when the headaches are migrainous. She has daily headaches that last all day long. She is under a lot of stress, headaches better when it is dark, the headache feels like eye strain.  Reviewed notes, labs and imaging from outside physicians, which showed:   CT headshowed No acute intracranial abnormalities including mass lesion or mass effect, hydrocephalus, extra-axial fluid collection, midline shift, hemorrhage, or acute infarction, large ischemic events (personally reviewed images)   hgba1c 5.3, RPR NR   REVIEW OF SYSTEMS: Out of a complete 14 system review of symptoms, the patient complains only of the following symptoms, headaches and all other reviewed systems are negative.   ALLERGIES: Allergies  Allergen Reactions   Sulfa Antibiotics Anaphylaxis   Metoclopramide Other (See Comments)    Elevated prolactin >5x ULN induction lactation when taking scheduled for  several months   Zosyn [Piperacillin Sod-Tazobactam So] Rash    Has patient had a PCN reaction causing immediate rash, facial/tongue/throat swelling, SOB or lightheadedness with hypotension: No Has patient had a PCN reaction causing severe rash involving mucus membranes or skin necrosis: No Has patient had a PCN reaction that required hospitalization: Unknown--inpatient when reaction occurred Has patient had a PCN reaction occurring within the last 10 years: Yes If all of the above answers are "NO", then may proceed with Cephalosporin use.    HOME MEDICATIONS: Outpatient Medications Prior to Visit  Medication Sig Dispense Refill   albuterol (PROVENTIL HFA;VENTOLIN HFA) 108 (90 Base) MCG/ACT inhaler Inhale 2 puffs into the lungs every 6 (six) hours as needed for wheezing or shortness of breath. 1 Inhaler 0   amitriptyline (ELAVIL) 10 MG tablet TAKE 2 TABLETS BY MOUTH AT BEDTIME. 60 tablet 2   amphetamine-dextroamphetamine (ADDERALL) 15 MG tablet TAKE 1 TABLET BY MOUTH 2 TIMES DAILY FOR 30 DAYS.(DNF 08/17/18) 60 tablet 0   BAYER MICROLET LANCETS lancets Use as instructed 100 each 12   Buprenorphine HCl 300 MCG FILM Place 300 mcg inside cheek every 12 (twelve) hours. Can also take for breakthrough pain per patient     CALCIUM-VITAMIN  D PO Take 1 tablet by mouth 2 (two) times daily. chewable     clonazePAM (KLONOPIN) 0.5 MG tablet TAKE 1 TABLET BY MOUTH DAILY AS NEEDED FOR ANXIETY. 30 tablet 0   Cyanocobalamin (VITAMIN B-12 SL) Place 1 drop under the tongue daily. 1 dropperful     cyclobenzaprine (FLEXERIL) 5 MG tablet TAKE 1 TABLET BY MOUTH 3 TIMES DAILY AS NEEDED FOR MUSCLE SPASMS. MAY TAKE 2 TABS BY MOUTH AT BEDTIME 40 tablet 2   desvenlafaxine (PRISTIQ) 100 MG 24 hr tablet TAKE 1 TABLET BY MOUTH DAILY. 90 tablet 0   docusate sodium (COLACE) 100 MG capsule Take 100 mg by mouth daily as needed.      glucose blood (CONTOUR NEXT TEST) test strip 1 each by Other route 4 (four) times  daily as needed (hypoglycemia). K91.2 270 each 5   lactulose (CHRONULAC) 10 GM/15ML solution Take 30 g by mouth daily as needed for moderate constipation.   0   levofloxacin (LEVAQUIN) 750 MG tablet Take 1 tablet (750 mg total) by mouth daily. 7 tablet 0   Multiple Vitamins-Minerals (BARIATRIC MULTIVITAMINS/IRON PO) Take 1 tablet by mouth 3 (three) times daily.     Omega-3 Fatty Acids (OMEGA-3 FISH OIL PO) Take by mouth daily.     ondansetron (ZOFRAN ODT) 4 MG disintegrating tablet Take 1 tablet (4 mg total) by mouth every 8 (eight) hours as needed for nausea or vomiting. 20 tablet 0   OVER THE COUNTER MEDICATION Meca root with xulane patch for hormone and premenopausal sx (wears all week).     OVER THE COUNTER MEDICATION Calcium, magnesium, zinc with D3 (one tablet daily)     OVER THE COUNTER MEDICATION Collagen and vitamin c and biotin (takes one daily)     OVER THE COUNTER MEDICATION Tunic for muscle and joint health     polyethylene glycol (MIRALAX / GLYCOLAX) packet Take 17 g by mouth daily as needed (for constipation.).      PREMARIN vaginal cream      simethicone (MYLICON) 80 MG chewable tablet Chew 80 mg by mouth every 6 (six) hours as needed for flatulence.     triamterene-hydrochlorothiazide (MAXZIDE-25) 37.5-25 MG tablet Take 1 tablet by mouth daily. 30 tablet 2   XULANE 150-35 MCG/24HR transdermal patch      eletriptan (RELPAX) 40 MG tablet Take 1 tablet (40 mg total) by mouth every 2 (two) hours as needed for migraine or headache. Do not use >2 doses/24 hours 10 tablet 2   topiramate (TOPAMAX) 100 MG tablet Take 1.5 tablets (150 mg total) by mouth daily. 30 tablet 2   omeprazole (PRILOSEC) 20 MG capsule Take 1 capsule (20 mg total) by mouth 2 (two) times daily before a meal for 14 days. 28 capsule 0   No facility-administered medications prior to visit.     PAST MEDICAL HISTORY: Past Medical History:  Diagnosis Date   Alopecia    Anxiety    Autoimmune  disease ([place])    "Alopecia"   Bacterial vaginosis    Cyst of spleen    Depression    Frequent UTI    Hypertrophy of breast    IIH (idiopathic intracranial hypertension) 2017   "related to headaches"   Migraine headache    denies, ruled out - Propranolol.Using for headaches    PAST SURGICAL HISTORY: Past Surgical History:  Procedure Laterality Date   APPENDECTOMY     BREAST EXCISIONAL BIOPSY Right 2017   removed at time of reduction  BREAST REDUCTION SURGERY Bilateral 03/26/2015   Procedure: BILATERAL BREAST REDUCTION  ;  Surgeon: Youlanda Roys, MD;  Location: [facility];  Service: Plastics;  Laterality: Bilateral;   BREATH TEK H PYLORI N/A 05/21/2014   Procedure: BREATH TEK H PYLORI;  Surgeon: Greer Pickerel, MD;  Location: [facility];  Service: General;  Laterality: N/A;   CELIAC PLEXUS BLOCK  07/18/2018   Per patient it was done at [facility] N/A 07/19/2012   Procedure: [place];  Surgeon: Farrel Gobble. Harrington Challenger, MD;  Location: [facility];  Service: Gynecology;  Laterality: N/A;   ENDOMETRIAL ABLATION     ESOPHAGOGASTRODUODENOSCOPY N/A 08/16/2015   Procedure: UPPER ESOPHAGOGASTRODUODENOSCOPY (EGD);  Surgeon: Greer Pickerel, MD;  Location: [facility];  Service: General;  Laterality: N/A;   ESOPHAGOGASTRODUODENOSCOPY N/A 02/21/2016   Procedure: ESOPHAGOGASTRODUODENOSCOPY (EGD);  Surgeon: Greer Pickerel, MD;  Location: [facility];  Service: General;  Laterality: N/A;   ESOPHAGOGASTRODUODENOSCOPY N/A 02/24/2017   Procedure: ESOPHAGOGASTRODUODENOSCOPY (EGD);  Surgeon: Greer Pickerel, MD;  Location: [facility];  Service: General;  Laterality: N/A;   IR GENERIC HISTORICAL  10/04/2015   IR [place] TUBE Eber Jones W/FLUORO 10/04/2015 Markus Daft, MD [facility]   IR GENERIC HISTORICAL  01/31/2016   IR  [place] TUBE PERCUT W/FLUORO 01/31/2016 Corrie Mckusick, DO [facility]   IR GENERIC HISTORICAL  04/06/2016   IR [place] TUBE Eber Jones W/FLUORO 04/06/2016 Arne Cleveland, MD [facility]   IR GENERIC HISTORICAL  05/14/2016   IR GASTR[place] TUBE PERC CHG W/O IMG GUIDE 05/14/2016 Sandi Mariscal, MD [facility]   LAPAROSCOPIC GASTROSTOMY N/A 09/30/2015   Procedure: LAPAROSCOPIC GASTROSTOMY TUBE PLACEMENT;  Surgeon: Greer Pickerel, MD;  Location: [facility];  Service: General;  Laterality: N/A;   LAPAROSCOPIC ROUX-EN-Y GASTRIC BYPASS WITH HIATAL HERNIA REPAIR N/A 07/08/2015   Procedure: LAPAROSCOPIC ROUX-EN-Y GASTRIC BYPASS WITH HIATAL HERNIA REPAIR WITH UPPER ENDOSCOPY;  Surgeon: Greer Pickerel, MD;  Location: [facility];  Service: General;  Laterality: N/A;   LAPAROSCOPIC SMALL BOWEL RESECTION N/A 09/30/2015   Procedure: LAPAROSCOPIC REVISION OF ROUX LIMB;  Surgeon: Greer Pickerel, MD;  Location: [facility];  Service: General;  Laterality: N/A;   LAPAROSCOPY N/A 09/30/2015   Procedure: LAPAROSCOPY DIAGNOSTIC;  Surgeon: Greer Pickerel, MD;  Location: [facility];  Service: General;  Laterality: N/A;   REDUCTION MAMMAPLASTY Bilateral 2017   TUBAL LIGATION     WISDOM TOOTH EXTRACTION      FAMILY HISTORY: Family History  Problem Relation Age of Onset   Cancer Mother        late 27'-50   Breast cancer Mother        late 25'-50   Stroke Father    Diabetes Brother    Seizures Son    Breast cancer Maternal Aunt        late 40'-50   Breast cancer Maternal Aunt        late 40'-50   Breast cancer Maternal Aunt        late 40'-50   Migraines Neg Hx     SOCIAL HISTORY: Social History   Socioeconomic History   Marital status: Divorced    Spouse name: Not on file   Number of children: 2   Years of education: 16   Highest education level: Not on file  Occupational History   Occupation: [employer]  Social Designer, fashion/clothing strain: Not on file   Food insecurity      Worry: Not on file    Inability: Not on file   Transportation needs    Medical: Not on file    Non-medical: Not on file  Tobacco Use   Smoking status: Never Smoker   Smokeless tobacco: Never Used  Substance and Sexual Activity   Alcohol use: Not Currently   Drug use: No   Sexual activity: Yes    Birth control/protection: Surgical  Lifestyle   Physical activity    Days per week: Not on file    Minutes per session: Not on file   Stress: Not on file  Relationships   Social connections    Talks on phone: Not on file    Gets together: Not on file    Attends religious service: Not on file    Active member of club or organization: Not on file    Attends meetings of clubs or organizations: Not on file    Relationship status: Not on file   Intimate partner violence    Fear of current or ex partner: Not on file    Emotionally abused: Not on file    Physically abused: Not on file    Forced sexual activity: Not on file  Other Topics Concern   Not on file  Social History Narrative   Lives with partner   Caffeine use: minimal coffee   Right-handed      PHYSICAL EXAM  Vitals:   01/09/19 0947  BP: 101/68  Pulse: 82  Temp: 98.3 F (36.8 C)  Weight: 193 lb 12.8 oz (87.9 kg)  Height: 5\' 8"  (1.727 m)   Body mass index is 29.47 kg/m.  Generalized: Well developed, in no acute distress  Cardiology: normal rate and rhythm, no murmur noted Neurological examination  Mentation: Alert oriented to time, place, history taking. Follows all commands speech and language fluent Cranial nerve II-XII: Pupils were equal round reactive to light. Extraocular movements were full, visual field were full on confrontational test. Facial sensation and strength were normal. Uvula tongue midline. Head turning and shoulder shrug  were normal and symmetric. Motor: The motor testing reveals 5 over 5 strength of all 4 extremities. Good symmetric motor tone is noted throughout.  Sensory:  Sensory testing is intact to soft touch on all 4 extremities. No evidence of extinction is noted.  Coordination: Cerebellar testing reveals good finger-nose-finger and heel-to-shin bilaterally.  Gait and station: Gait is normal.    DIAGNOSTIC DATA (LABS, IMAGING, TESTING) - I reviewed patient records, labs, notes, testing and imaging myself where available.  No flowsheet data found.   Lab Results  Component Value Date   WBC 9.8 01/06/2019   HGB 12.7 01/06/2019   HCT 38.2 01/06/2019   MCV 92.8 01/06/2019   PLT 273 03/22/2018      Component Value Date/Time   NA 139 11/28/2018 1009   K 4.5 11/28/2018 1009   CL 104 11/28/2018 1009   CO2 22 11/28/2018 1009   GLUCOSE 73 11/28/2018 1009   GLUCOSE 86 03/22/2018 0026   BUN 25 (H) 11/28/2018 1009   CREATININE 1.22 (H) 11/28/2018 1009   CREATININE 0.95 11/15/2015 1712   CALCIUM 8.5 (L) 11/28/2018 1009   PROT 5.9 (L) 11/28/2018 1009   ALBUMIN 3.5 (L) 11/28/2018 1009   AST 24 11/28/2018 1009   ALT 11 11/28/2018 1009   ALKPHOS 71 11/28/2018 1009   BILITOT 0.3  11/28/2018 1009   GFRNONAA 53 (L) 11/28/2018 1009   GFRNONAA 73 11/15/2015 1712   GFRAA 61 11/28/2018 1009   GFRAA 84 11/15/2015 1712   No results found for: CHOL, HDL, LDLCALC, LDLDIRECT, TRIG, CHOLHDL Lab Results  Component Value Date   HGBA1C 5.3 03/16/2016   Lab Results  Component Value Date   VITAMINB12 1,551 (H) 03/14/2018   Lab Results  Component Value Date   TSH 1.210 04/28/2017    ASSESSMENT AND PLAN 48 y.o. year old female  has a past medical history of Alopecia, Anxiety, Autoimmune disease ([place]), Bacterial vaginosis, Cyst of spleen, Depression, Frequent UTI, Hypertrophy of breast, IIH (idiopathic intracranial hypertension) (2017), and Migraine headache. here with     ICD-10-CM   1. Chronic migraine without aura without status migrainosus, not intractable  G43.709 topiramate (TOPAMAX) 100 MG tablet  2. History of idiopathic intracranial hypertension  Z86.69      Mrs Amedeo Fletcher reports that headaches have improved since increasing topiramate to 150 mg.  She does note more memory fog but feels that this is manageable.  She is doing well with amitriptyline 10 mg at night.  She continues to need a full prescription of Relpax each month.  I have advised that she work on increasing her hydration.  Eye exam was reportedly normal over the summer.  I will request a copy of that report today.  We have discussed chronic pain management in relationship and rebound headaches.  We have also discussed concerns of hormonal therapy with her history of IIH.  She reports that hormone patches are helping significantly with hot flashes and dysmenorrhea.  She will discuss options for nonhormonal treatments with her GYN provider.  I have advised regular eye exams.  We will continue current therapy.  She will follow-up with me in 6 months, sooner if needed.  She verbalizes understanding and agreement with this plan.   No orders of the defined types were placed in this encounter.    Meds ordered this encounter  Medications   topiramate (TOPAMAX) 100 MG tablet    Sig: Take 1.5 tablets (150 mg total) by mouth daily.    Dispense:  135 tablet    Refill:  1    Order Specific Question:   Supervising Provider    Answer:   Melvenia Beam [id number]   eletriptan (RELPAX) 40 MG tablet    Sig: Take 1 tablet (40 mg total) by mouth every 2 (two) hours as needed for migraine or headache. Do not use >2 doses/24 hours    Dispense:  10 tablet    Refill:  5    Order Specific Question:   Supervising Provider    Answer:   Melvenia Beam [id number]      I spent 25 minutes with the patient. 50% of this time was spent counseling and educating patient on plan of care and medications.    Debbora Presto, FNP-C 01/09/2019, 10:19 AM [facility] [address], [place], [place] [address] [phone]  Made any corrections needed, and agree with history, physical, neuro  exam,assessment and plan as stated.     Sarina Ill, MD [facility]

## 2019-01-09 NOTE — Progress Notes (Signed)
Fax confirmation [place] [phone] for most recent eye exam.

## 2019-01-09 NOTE — Patient Instructions (Addendum)
Continue topiramate 150mg  daily and amitriptyline 10mg  daily  Continue Relpax but try to avoid regular use  Continue to assess need for daily pain medications as these can lend to rebound headaches  Discuss use of hormal patch for dysmenorrhea as this can contribute to IIH  Please continue regular eye exams, have them send US exam report  Follow up in 6 months   Analgesic Rebound Headache An analgesic rebound headache, sometimes called a medication overuse headache, is a headache that comes after pain medicine (analgesic) taken to treat the original (primary) headache has worn off. Any type of primary headache can return as a rebound headache if a person regularly takes analgesics more than three times a week to treat it. The types of primary headaches that are commonly associated with rebound headaches include:  Migraines.  Headaches that arise from tense muscles in the head and neck area (tension headaches).  Headaches that develop and happen again (recur) on one side of the head and around the eye (cluster headaches). If rebound headaches continue, they become chronic daily headaches. What are the causes? This condition may be caused by frequent use of:  Over-the-counter medicines such as aspirin, ibuprofen, and acetaminophen.  Sinus relief medicines and other medicines that contain caffeine.  Narcotic pain medicines such as codeine and oxycodone. What are the signs or symptoms? The symptoms of a rebound headache are the same as the symptoms of the original headache. Some of the symptoms of specific types of headaches include: Migraine headache  Pulsing or throbbing pain on one or both sides of the head.  Severe pain that interferes with daily activities.  Pain that is worsened by physical activity.  Nausea, vomiting, or both.  Pain with exposure to bright light, loud noises, or strong smells.  General sensitivity to bright light, loud noises, or strong smells.  Visual  changes.  Numbness of one or both arms. Tension headache  Pressure around the head.  Dull, aching head pain.  Pain felt over the front and sides of the head.  Tenderness in the muscles of the head, neck, and shoulders. Cluster headache  Severe pain that begins in or around one eye or temple.  Redness and tearing in the eye on the same side as the pain.  Droopy or swollen eyelid.  One-sided head pain.  Nausea.  Runny nose.  Sweaty, pale facial skin.  Restlessness. How is this diagnosed? This condition is diagnosed by:  Reviewing your medical history. This includes the nature of your primary headaches.  Reviewing the types of pain medicines that you have been using to treat your headaches and how often you take them. How is this treated? This condition may be treated or managed by:  Discontinuing frequent use of the analgesic medicine. Doing this may worsen your headaches at first, but the pain should eventually become more manageable, less frequent, and less severe.  Seeing a headache specialist. He or she may be able to help you manage your headaches and help make sure there is not another cause of the headaches.  Using methods of stress relief, such as acupuncture, counseling, biofeedback, and massage. Talk with your health care provider about which methods might be good for you. Follow these instructions at home:  Take over-the-counter and prescription medicines only as told by your health care provider.  Stop the repeated use of pain medicine as told by your health care provider. Stopping can be difficult. Carefully follow instructions from your health care provider.  Avoid triggers  that are known to cause your primary headaches.  Keep all follow-up visits as told by your health care provider. This is important. Contact a health care provider if:  You continue to experience headaches after following treatments that your health care provider recommended. Get  help right away if:  You develop new headache pain.  You develop headache pain that is different than what you have experienced in the past.  You develop numbness or tingling in your arms or legs.  You develop changes in your speech or vision. This information is not intended to replace advice given to you by your health care provider. Make sure you discuss any questions you have with your health care provider. Document Released: 05/16/2003 Document Revised: 02/05/2017 Document Reviewed: 07/29/2015 Elsevier Patient Education  2020 [employer].

## 2019-01-12 NOTE — Telephone Encounter (Signed)
Please let patient know that as discussed before, I would not refill adderrall or clonazepam, she was referred to psychiatry in sept. thanks

## 2019-01-18 MED FILL — BELBUCA 600 MCG FILM: 600 | 30 days supply | Qty: 60 | Fill #0

## 2019-01-26 ENCOUNTER — Other Ambulatory Visit: Payer: Self-pay | Admitting: Family Medicine

## 2019-01-26 MED FILL — CYCLOBENZAPRINE HCL 5 MG TA: 5 | 30 days supply | Qty: 30 | Fill #2

## 2019-01-26 NOTE — Telephone Encounter (Signed)
Requested medication (s) are due for refill today:  yes  Requested medication (s) are on the active medication list: yes  Last refill:  9/11/202  Future visit scheduled: yes  Notes to clinic:  Refill cannot be delegated    Requested Prescriptions  Pending Prescriptions Disp Refills   amphetamine-dextroamphetamine (ADDERALL) 15 MG tablet [Pharmacy Med Name: AMPHETAMINE-DEXTROAMPHETAMI 15 Tablet] 60 tablet 0    Sig: TAKE 1 TABLET BY MOUTH TWO TIMES DAILY     Not Delegated - Psychiatry:  Stimulants/ADHD Failed - 01/26/2019  3:01 PM      Failed - This refill cannot be delegated      Failed - Urine Drug Screen completed in last 360 days.      Passed - Valid encounter within last 3 months    Recent Outpatient Visits          2 weeks ago Urinary urgency   [facility], Delfino Lovett, NP   3 weeks ago Urinary urgency   [facility], [place], NP   1 month ago Essential hypertension, benign   [facility], Lilia Argue, MD   3 months ago Elevated BP without diagnosis of hypertension   [facility], Lilia Argue, MD   3 months ago Close Exposure to Covid-19 Virus   Primary Care at [facility], Ines Bloomer, MD      Future Appointments            In 1 month Pamella Pert, Lilia Argue, MD Primary Care at [place], [facility]

## 2019-01-26 NOTE — Telephone Encounter (Signed)
Requested medication (s) are due for refill today: yes  Requested medication (s) are on the active medication list: yes  Last refill:  11/23/2018  Future visit scheduled: yes  Notes to clinic:  Refill cannot be delegated    Requested Prescriptions  Pending Prescriptions Disp Refills   clonazePAM (KLONOPIN) 0.5 MG tablet [Pharmacy Med Name: clonazePAM 0.5 MG TABS 0.5 Tablet] 30 tablet 0    Sig: TAKE 1 TABLET BY MOUTH ONCE A DAY AS NEEDED FOR ANXIETY     Not Delegated - Psychiatry:  Anxiolytics/Hypnotics Failed - 01/26/2019  3:01 PM      Failed - This refill cannot be delegated      Failed - Urine Drug Screen completed in last 360 days.      Passed - Valid encounter within last 6 months    Recent Outpatient Visits          2 weeks ago Urinary urgency   [facility], Delfino Lovett, NP   3 weeks ago Urinary urgency   [facility], [place], NP   1 month ago Essential hypertension, benign   [facility], Lilia Argue, MD   3 months ago Elevated BP without diagnosis of hypertension   [facility], Lilia Argue, MD   3 months ago Close Exposure to Covid-19 Virus   Primary Care at [facility], Ines Bloomer, MD      Future Appointments            In 1 month Pamella Pert, Lilia Argue, MD Primary Care at [place], [facility]

## 2019-01-29 ENCOUNTER — Encounter: Payer: Self-pay | Admitting: Family Medicine

## 2019-01-31 ENCOUNTER — Other Ambulatory Visit: Payer: Self-pay | Admitting: Family Medicine

## 2019-01-31 NOTE — Telephone Encounter (Signed)
Requested medication (s) are due for refill today: yes  Requested medication (s) are on the active medication list: yes  Last refill:  08/30/2018  Future visit scheduled: yes  Notes to clinic:  Patient calling to check status of this request. Patient requesting call back from [name] to discuss. Patient has not set up appointment with behavioral health yet   Requested Prescriptions  Pending Prescriptions Disp Refills   amphetamine-dextroamphetamine (ADDERALL) 15 MG tablet [Pharmacy Med Name: AMPHETAMINE-DEXTROAMPHETAMI 15 Tablet] 60 tablet 0    Sig: TAKE 1 TABLET BY MOUTH 2 TIMES DAILY FOR 30 DAYS.(DNF 08/17/18)     Not Delegated - Psychiatry:  Stimulants/ADHD Failed - 01/31/2019 11:31 AM      Failed - This refill cannot be delegated      Failed - Urine Drug Screen completed in last 360 days.      Passed - Valid encounter within last 3 months    Recent Outpatient Visits          3 weeks ago Urinary urgency   [facility], Delfino Lovett, NP   3 weeks ago Urinary urgency   [facility], [place], NP   2 months ago Essential hypertension, benign   [facility], Lilia Argue, MD   3 months ago Elevated BP without diagnosis of hypertension   [facility], Lilia Argue, MD   3 months ago Close Exposure to Covid-19 Virus   Primary Care at [facility], Ines Bloomer, MD      Future Appointments            In 4 weeks Rutherford Guys, MD Primary Care at [place], [facility]

## 2019-01-31 NOTE — Telephone Encounter (Signed)
Patient calling to check status of this request. Patient requesting call back from [name] to discuss. Patient has not set up appointment with behavioral health yet.

## 2019-02-01 NOTE — Telephone Encounter (Signed)
Pt requesting meds.

## 2019-02-08 DIAGNOSIS — Z79891 Long term (current) use of opiate analgesic: Secondary | ICD-10-CM | POA: Diagnosis not present

## 2019-02-08 DIAGNOSIS — M7918 Myalgia, other site: Secondary | ICD-10-CM | POA: Diagnosis not present

## 2019-02-08 DIAGNOSIS — R110 Nausea: Secondary | ICD-10-CM | POA: Diagnosis not present

## 2019-02-08 DIAGNOSIS — Z79899 Other long term (current) drug therapy: Secondary | ICD-10-CM | POA: Diagnosis not present

## 2019-02-08 DIAGNOSIS — G8929 Other chronic pain: Secondary | ICD-10-CM | POA: Diagnosis not present

## 2019-02-08 DIAGNOSIS — R1013 Epigastric pain: Secondary | ICD-10-CM | POA: Diagnosis not present

## 2019-02-08 MED FILL — ONDANSETRON ODT 8 MG TABLET: 8 | 6 days supply | Qty: 20 | Fill #0

## 2019-02-08 MED FILL — CYCLOBENZAPRINE HCL 5 MG TA: 5 | 30 days supply | Qty: 30 | Fill #0

## 2019-02-13 ENCOUNTER — Ambulatory Visit (INDEPENDENT_AMBULATORY_CARE_PROVIDER_SITE_OTHER): Payer: Medicare HMO | Admitting: Family Medicine

## 2019-02-13 ENCOUNTER — Other Ambulatory Visit: Payer: Self-pay

## 2019-02-13 ENCOUNTER — Encounter: Payer: Self-pay | Admitting: Family Medicine

## 2019-02-13 ENCOUNTER — Ambulatory Visit (INDEPENDENT_AMBULATORY_CARE_PROVIDER_SITE_OTHER): Payer: Medicare HMO

## 2019-02-13 ENCOUNTER — Other Ambulatory Visit: Payer: Self-pay | Admitting: Family Medicine

## 2019-02-13 DIAGNOSIS — M5412 Radiculopathy, cervical region: Secondary | ICD-10-CM

## 2019-02-13 DIAGNOSIS — M62838 Other muscle spasm: Secondary | ICD-10-CM | POA: Diagnosis not present

## 2019-02-13 DIAGNOSIS — R29898 Other symptoms and signs involving the musculoskeletal system: Secondary | ICD-10-CM | POA: Diagnosis not present

## 2019-02-13 MED ORDER — PREDNISONE 20 MG PO TABS: ORAL_TABLET | ORAL | 0 refills | Status: DC

## 2019-02-13 MED ORDER — METHOCARBAMOL 500 MG PO TABS: ORAL_TABLET | ORAL | 0 refills | Status: DC

## 2019-02-13 MED FILL — METHOCARBAMOL 500 MG TABS: 500 | 10 days supply | Qty: 40 | Fill #0

## 2019-02-13 MED FILL — predniSONE 20 MG TABS: 20 | 6 days supply | Qty: 12 | Fill #0

## 2019-02-13 NOTE — Patient Instructions (Addendum)
X-rays of the neck look okay.  Take the prednisone 3 pills daily for 2 days, then 2 daily for 2 days, then 1 daily for 2 days.  Best taken together in the mornings after breakfast.  Take the muscle relaxant Robaxin (methocarbamol) 1 in the morning, 1 in the afternoon, and 1 or 2 at bedtime as needed for muscle tightness and pain in the shoulder area.  (Your medicine list shows you have had Flexeril (cyclobenzaprine) in the past, and you should not take it along with this muscle relaxant.  Take Tylenol (acetaminophen) 500 mg 2 pills 3 times daily for pain while it is hurting so much.  If your stomach tolerates it you can take some ibuprofen 200 mg if needed, but this may cause some stomach irritation with your history.  If symptoms do not improve, you may still need a cervical MRI, but hopefully symptoms will come down with treatment.    Referral to a neurologist might be necessary if you keep having the numbness and weakness down that arm.  Please contact your doctor if symptoms persist like this.  Return or see your primary doctor as needed.  If you have lab work done today you will be contacted with your lab results within the next 2 weeks.  If you have not heard from [name] then please contact us. The fastest way to get your results is to register for My Chart.   IF you received an x-ray today, you will receive an invoice from [facility]. Please contact [facility] at [phone] with questions or concerns regarding your invoice.   IF you received labwork today, you will receive an invoice from [place]. Please contact [facility] at [phone] with questions or concerns regarding your invoice.   Our billing staff will not be able to assist you with questions regarding bills from these companies.  You will be contacted with the lab results as soon as they are available. The fastest way to get your results is to activate your My Chart account. Instructions are located on the  last page of this paperwork. If you have not heard from [name] regarding the results in 2 weeks, please contact this office.

## 2019-02-13 NOTE — Progress Notes (Signed)
Patient ID: Margaret Fletcher, female    DOB: [birth year]  Age: 48 y.o. MRN: [id number]  Chief Complaint  Patient presents with  . right arm numbness    ongoing and start starting to acky feeling from shoulder to the fingers. (Pins and needles with burning feeling )    Subjective:   48 year old lady who comes in with a history of a lot of pain in her right shoulder and arm over the last week.  She has had problems intermittently with this.  A year ago she had an injury and got her shoulder x-rayed, but she has never had neck x-rays of the neck..  She has been having a lot of medical problems over the last 20 years.  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  She has gastric motility problems and has had been treated with G-tube though currently is being able to eat by mouth and the G-tube is gone.  She has had migraine problems, for which she is done well when she takes steroids.  No acute injuries or major neck injury history.  She feels knots in the upper right back muscles.  Her right arm has a decreased grip and sensation and she drops things sometimes without knowing it.  The pain radiates all the way down into her hand.  Last night she had a miserable night.  Current allergies, medications, problem list, past/family and social histories reviewed.  Objective:  There were no vitals taken for this visit.  Alert and oriented, pleasant lady.  Neck is full range of motion.  She does have pain on full extension of the neck but otherwise does fairly well.  No crepitance was appreciated.  The right paracervical muscles are tender and tight.  She is tight in the medial aspect of the trapezius with some knots palpable in the muscle there and down to between the upper area of the scapula.  No visual atrophy or problems.  Grip is good in the left hand, but she grips weakly with right.  However thumb finger out opposition seems to be fairly strong and symmetrical.  Gross sensation she  complains of a little subjective decreased sensation in the right hand and arm.  Shoulder and upper arm strength seems fairly adequate bilaterally.  Assessment & Plan:   Assessment: 1. Cervical radiculopathy   2. Right arm weakness   3. Trapezius muscle spasm       Plan: This seems to be more muscular than from the neck itself, but I will get an x-ray of the C-spine.  If she keeps on having further problems would need to have an MRI and see a neurologist.  Spoke some more with the patient.  She also asked about some prescription she had asked the pharmacist to call in for refills on, and I cannot locate anything on that so she is going to talk with the pharmacist more.  Orders Placed This Encounter  Procedures  . DG Cervical Spine Complete    Standing Status:   Future    Number of Occurrences:   1    Standing Expiration Date:   02/13/2020    Order Specific Question:   Reason for Exam (SYMPTOM  OR DIAGNOSIS REQUIRED)    Answer:   right cervical radiculopathy    Order Specific Question:   Is the patient pregnant?    Answer:   No    Order Specific Question:   Preferred imaging location?    Answer:   External  No orders of the defined types were placed in this encounter.        Patient Instructions   X-rays of the neck look okay.  Take the prednisone 3 pills daily for 2 days, then 2 daily for 2 days, then 1 daily for 2 days.  Best taken together in the mornings after breakfast.  Take the muscle relaxant Robaxin (methocarbamol) 1 in the morning, 1 in the afternoon, and 1 or 2 at bedtime as needed for muscle tightness and pain in the shoulder area.  (Your medicine list shows you have had Flexeril (cyclobenzaprine) in the past, and you should not take it along with this muscle relaxant.  If symptoms do not improve, you may still need a cervical MRI, but hopefully symptoms will come down with treatment.    Referral to a neurologist might be necessary if you keep having the  numbness and weakness down that arm.  Please contact your doctor if symptoms persist like this.  Return or see your primary doctor as needed.  If you have lab work done today you will be contacted with your lab results within the next 2 weeks.  If you have not heard from [name] then please contact us. The fastest way to get your results is to register for My Chart.   IF you received an x-ray today, you will receive an invoice from [facility]. Please contact [facility] at [phone] with questions or concerns regarding your invoice.   IF you received labwork today, you will receive an invoice from [place]. Please contact [facility] at [phone] with questions or concerns regarding your invoice.   Our billing staff will not be able to assist you with questions regarding bills from these companies.  You will be contacted with the lab results as soon as they are available. The fastest way to get your results is to activate your My Chart account. Instructions are located on the last page of this paperwork. If you have not heard from [name] regarding the results in 2 weeks, please contact this office.         Return if symptoms worsen or fail to improve.   Ruben Reason, MD 02/13/2019

## 2019-02-14 ENCOUNTER — Ambulatory Visit: Payer: Medicare Other | Admitting: Family Medicine

## 2019-02-14 NOTE — Telephone Encounter (Signed)
Requested medication (s) are due for refill today: yes  Requested medication (s) are on the active medication list: yes  Last refill:  11/23/2018  Future visit scheduled: yes  Notes to clinic: refill cannot be delegated Looks like this has been denied but I am unable to refuse it   Requested Prescriptions  Pending Prescriptions Disp Refills   clonazePAM (KLONOPIN) 0.5 MG tablet [Pharmacy Med Name: clonazePAM 0.5 MG TABS 0.5 Tablet] 30 tablet 0    Sig: TAKE 1 TABLET BY MOUTH ONCE A DAY AS NEEDED FOR ANXIETY     Not Delegated - Psychiatry:  Anxiolytics/Hypnotics Failed - 02/13/2019  4:58 PM      Failed - This refill cannot be delegated      Failed - Urine Drug Screen completed in last 360 days.      Passed - Valid encounter within last 6 months    Recent Outpatient Visits          Yesterday Cervical radiculopathy   Primary Care at [facility], Fenton Malling, MD   1 month ago Urinary urgency   [facility] at [facility], Delfino Lovett, NP   1 month ago Urinary urgency   [facility] at [facility], [place], NP   2 months ago Essential hypertension, benign   [facility] at [facility], Lilia Argue, MD   4 months ago Elevated BP without diagnosis of hypertension   [facility] at [facility], Lilia Argue, MD      Future Appointments            In 2 weeks Rutherford Guys, MD Primary Care at [place], [facility]            amphetamine-dextroamphetamine (ADDERALL) 15 MG tablet [Pharmacy Med Name: AMPHETAMINE-DEXTROAMPHETAMI 15 Tablet] 60 tablet 0    Sig: TAKE 1 TABLET BY MOUTH 2 TIMES DAILY FOR 30 DAYS.(DNF 08/17/18)     Not Delegated - Psychiatry:  Stimulants/ADHD Failed - 02/13/2019  4:58 PM      Failed - This refill cannot be delegated      Failed - Urine Drug Screen completed in last 360 days.      Passed - Valid encounter within last 3 months    Recent Outpatient Visits          Yesterday Cervical radiculopathy   Primary Care at [facility], Fenton Malling, MD   1 month ago Urinary  urgency   [facility] at [facility], Delfino Lovett, NP   1 month ago Urinary urgency   [facility] at [facility], [place], NP   2 months ago Essential hypertension, benign   [facility] at [facility], Lilia Argue, MD   4 months ago Elevated BP without diagnosis of hypertension   [facility] at [facility], Lilia Argue, MD      Future Appointments            In 2 weeks Rutherford Guys, MD Primary Care at [place], [facility]

## 2019-02-15 ENCOUNTER — Telehealth: Payer: Self-pay | Admitting: *Deleted

## 2019-02-15 MED FILL — ELETRIPTAN HBR 40 MG TABLET: 40 | 5 days supply | Qty: 10 | Fill #0

## 2019-02-15 MED FILL — BELBUCA 600 MCG FILM: 600 | 30 days supply | Qty: 60 | Fill #0

## 2019-02-15 NOTE — Telephone Encounter (Signed)
I spoke with [facility] @ [employer]. She said the pt hasn't filled the Eletriptan there since May. She stated the copay is $108. She will fill the medication and give the pt a call.

## 2019-02-15 NOTE — Telephone Encounter (Signed)
Received PA request for Eletriptan. I tried to complete one on CMM. KEY: [id number] . Received this message: Authorization already on file for this request.

## 2019-02-16 ENCOUNTER — Telehealth: Payer: Self-pay | Admitting: Family Medicine

## 2019-02-16 NOTE — Telephone Encounter (Signed)
Pt has not been able to see psychiatry to be able to continue medication refill, she has been waiting on referral dept, and she has called as well to be able to get in .   She would like for dr, Pamella Pert to continue her medication refills in the mean while  Please advise  amphetamine-dextroamphetamine (ADDERALL) 15 MG tablet [id number]    topiramate (TOPAMAX) 100 MG tablet [id number]  clonazePAM (KLONOPIN) 0.5 MG tablet [id number]  Request a  Call back for update ASAP

## 2019-02-17 NOTE — Telephone Encounter (Signed)
Patient is requesting a refill of the following medications: Requested Prescriptions    No prescriptions requested or ordered in this encounter  adderall 15 mg topamax 100 mg  Clonazepam 0.5 mg   Date of patient request: 02/17/2019 Last office visit:11/28/18 Date of last refill: adderalll last fill date: 10/12/18 #60 no refills topamax last fill date: 01/09/19 #135 with 1 refill, clonazepam last fill date:11/22/18 #30 with no refills Last refill amount: Follow up time period per chart: n/a

## 2019-02-17 NOTE — Telephone Encounter (Signed)
Patient's concern/request has been addressed, prescriptions denied, she was referred to psychiatry, I had informed her that I would not continue these prescriptions. thanks

## 2019-02-22 ENCOUNTER — Telehealth: Payer: Self-pay | Admitting: Family Medicine

## 2019-02-22 MED FILL — TRIAMTERENE-HCTZ 37.5-25 MG: 37.5-25 | 30 days supply | Qty: 30 | Fill #2

## 2019-02-22 NOTE — Telephone Encounter (Signed)
Patient called stating she would like to be referred to a new PCP that is familiar with the patients conditions. 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    Please follow up.

## 2019-02-22 NOTE — Telephone Encounter (Signed)
Spoke with pt to let her know that her medication was denied per provider. She has been referred out to psychiatry. Please refer to noted from 02/17/2019.pk/[name]

## 2019-02-22 NOTE — Telephone Encounter (Signed)
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 Jaynee Eagles MD are neurology and they manage her headaches along with her intercranial hypertension. Pt stated her old PCP left the practice and she is seeing Dr. Pamella Pert for her primary care. Pt began to state her PCP has stop prescribing her adderall and klonopin and referred her to a psychiatrist to manage.The psychiatrist office cant see her until February 2021. I advise pt to contact her PCP or the psychiatrist office she was referred to about being on cancellation list. The pt began to cry because her medications were stop by PCP. I advise pt to reach out to her PCP again for her concerns. Pt appreciate the call and verbalized understanding.

## 2019-02-23 NOTE — Telephone Encounter (Signed)
Noted 02/17/2019 PCP office has spoken with pt that her addreall,and klonopin will not be refill by her. See chart. Pt has been referred to psychiatry by her PCP on 04/27/2018, 09/22/2018, and 12/05/2018. Pt has an appt in February 2021 with the psychiatry. Pt already seen pain management.

## 2019-02-28 ENCOUNTER — Ambulatory Visit (INDEPENDENT_AMBULATORY_CARE_PROVIDER_SITE_OTHER): Payer: Medicare HMO | Admitting: Family Medicine

## 2019-02-28 ENCOUNTER — Encounter: Payer: Self-pay | Admitting: Nurse Practitioner

## 2019-02-28 ENCOUNTER — Other Ambulatory Visit: Payer: Self-pay

## 2019-02-28 ENCOUNTER — Ambulatory Visit: Payer: Medicare Other | Admitting: Family Medicine

## 2019-02-28 ENCOUNTER — Encounter: Payer: Self-pay | Admitting: Family Medicine

## 2019-02-28 VITALS — BP 106/67 | HR 96 | Temp 97.6°F | Ht 68.0 in | Wt 195.0 lb

## 2019-02-28 DIAGNOSIS — I10 Essential (primary) hypertension: Secondary | ICD-10-CM

## 2019-02-28 DIAGNOSIS — M5412 Radiculopathy, cervical region: Secondary | ICD-10-CM | POA: Diagnosis not present

## 2019-02-28 DIAGNOSIS — K219 Gastro-esophageal reflux disease without esophagitis: Secondary | ICD-10-CM

## 2019-02-28 DIAGNOSIS — Z9884 Bariatric surgery status: Secondary | ICD-10-CM | POA: Diagnosis not present

## 2019-02-28 DIAGNOSIS — F4323 Adjustment disorder with mixed anxiety and depressed mood: Secondary | ICD-10-CM

## 2019-02-28 MED ORDER — TRIAMTERENE-HCTZ 37.5-25 MG PO TABS: 1.0000 | ORAL_TABLET | Freq: Every day | ORAL | 1 refills | Status: DC

## 2019-02-28 MED ORDER — BUSPIRONE HCL 5 MG PO TABS: 5.0000 mg | ORAL_TABLET | Freq: Three times a day (TID) | ORAL | 0 refills | Status: DC

## 2019-02-28 MED ORDER — PREDNISONE 20 MG PO TABS: ORAL_TABLET | ORAL | 0 refills | Status: DC

## 2019-02-28 MED ORDER — FAMOTIDINE 20 MG PO TABS: 20.0000 mg | ORAL_TABLET | Freq: Two times a day (BID) | ORAL | 2 refills | Status: DC

## 2019-02-28 MED FILL — FAMOTIDINE 20 MG TABS: 20 | 30 days supply | Qty: 60 | Fill #0

## 2019-02-28 MED FILL — busPIRone HCL 5 MG TABS: 5 | 30 days supply | Qty: 90 | Fill #0

## 2019-02-28 MED FILL — predniSONE 20 MG TABS: 20 | 6 days supply | Qty: 12 | Fill #0

## 2019-02-28 NOTE — Progress Notes (Signed)
12/22/20208:20 AM  Margaret Fletcher January 22, 1971, 48 y.o., female [id number]  Chief Complaint  Patient presents with  . Hypertension  . Medication Management    wants to talk about rx being written by pcp    HPI:   Patient is a 48 y.o. female with past medical history significant for migraine, idiopathic intracranial hypertension, OSA, constipation, empty sella syndrome, G tube feeding, s/p gastric bypasswho presents today for BP   Last OV Sept 2020 - started on maxzide for HTN Tolerating well  Patient very frustrated because I have not continued her adderral and clonazepam Pending psych referral which she will see in feb  adderral is for brain fogginess from topomax which she takes for [place] She discussed recently with neurologist, and it seems they were not willing to take on prescription Neurology also has concerns that she is on xulane  She takes clonazepam prn for anxiety On max dose of pristiq Has never tried anything else Has never done counseling  Having itchy mildly swollen tongue and gums Hoarseness, has had thrush, hurts to swallow Has seen dentist  New onset of right arm pain, numbness, tingling Feels like a tight back around her upper arm Radiating down from her neck  Chronic pain ordered MRI Recent pred really helped she is requesting another round as pain really uncontrolled, keeping her up at night  Depression screen [facility] 2/9 02/28/2019 01/06/2019 01/04/2019  Decreased Interest 0 0 3  Down, Depressed, Hopeless 0 0 3  PHQ - 2 Score 0 0 6  Altered sleeping - - 2  Tired, decreased energy - - 3  Change in appetite - - 3  Feeling bad or failure about yourself  - - 0  Trouble concentrating - - 3  Moving slowly or fidgety/restless - - 3  Suicidal thoughts - - 0  PHQ-9 Score - - 20  Difficult doing work/chores - - Extremely dIfficult  Some recent data might be hidden    Fall Risk  02/28/2019 02/13/2019 01/06/2019 01/04/2019 11/28/2018  Falls in the  past year? 0 0 0 0 0  Number falls in past yr: 0 0 0 0 0  Injury with Fall? 0 0 0 0 0  Comment - - - - -  Follow up - Falls evaluation completed - Falls evaluation completed Falls evaluation completed     Allergies  Allergen Reactions  . Sulfa Antibiotics Anaphylaxis  . Metoclopramide Other (See Comments)    Elevated prolactin >5x ULN induction lactation when taking scheduled for several months  . Zosyn [Piperacillin Sod-Tazobactam So] Rash    Has patient had a PCN reaction causing immediate rash, facial/tongue/throat swelling, SOB or lightheadedness with hypotension: No Has patient had a PCN reaction causing severe rash involving mucus membranes or skin necrosis: No Has patient had a PCN reaction that required hospitalization: Unknown--inpatient when reaction occurred Has patient had a PCN reaction occurring within the last 10 years: Yes If all of the above answers are "NO", then may proceed with Cephalosporin use.     Prior to Admission medications   Medication Sig Start Date End Date Taking? Authorizing Provider  albuterol (PROVENTIL HFA;VENTOLIN HFA) 108 (90 Base) MCG/ACT inhaler Inhale 2 puffs into the lungs every 6 (six) hours as needed for wheezing or shortness of breath. 01/25/18   Shawnee Knapp, MD  amitriptyline (ELAVIL) 10 MG tablet TAKE 2 TABLETS BY MOUTH AT BEDTIME. 10/11/18   Rutherford Guys, MD  amphetamine-dextroamphetamine (ADDERALL) 15 MG tablet TAKE 1 TABLET BY  MOUTH 2 TIMES DAILY FOR 30 DAYS.(DNF 08/17/18) Patient not taking: Reported on 02/28/2019 10/12/18   Forrest Moron, MD  BAYER MICROLET LANCETS lancets Use as instructed 06/13/17   Shawnee Knapp, MD  Buprenorphine HCl 300 MCG FILM Place 450 mcg inside cheek every 12 (twelve) hours. Can also take for breakthrough pain per patient     [provider]  CALCIUM-VITAMIN D PO Take 1 tablet by mouth 2 (two) times daily. chewable    [provider]  clonazePAM (KLONOPIN) 0.5 MG tablet TAKE 1 TABLET BY MOUTH  DAILY AS NEEDED FOR ANXIETY. Patient not taking: Reported on 02/28/2019 11/22/18   Rutherford Guys, MD  Cyanocobalamin (VITAMIN B-12 SL) Place 1 drop under the tongue daily. 1 dropperful    [provider]  cyclobenzaprine (FLEXERIL) 5 MG tablet TAKE 1 TABLET BY MOUTH 3 TIMES DAILY AS NEEDED FOR MUSCLE SPASMS. MAY TAKE 2 TABS BY MOUTH AT BEDTIME 06/17/18   Rutherford Guys, MD  desvenlafaxine (PRISTIQ) 100 MG 24 hr tablet TAKE 1 TABLET BY MOUTH DAILY. 11/22/18   Rutherford Guys, MD  docusate sodium (COLACE) 100 MG capsule Take 100 mg by mouth daily as needed.     [provider]  eletriptan (RELPAX) 40 MG tablet Take 1 tablet (40 mg total) by mouth every 2 (two) hours as needed for migraine or headache. Do not use >2 doses/24 hours 01/09/19   Lomax, Amy, NP  glucose blood (CONTOUR NEXT TEST) test strip 1 each by Other route 4 (four) times daily as needed (hypoglycemia). K91.2 06/10/17   Shawnee Knapp, MD  lactulose (CHRONULAC) 10 GM/15ML solution Take 30 g by mouth daily as needed for moderate constipation.  01/14/16   [provider]  levofloxacin (LEVAQUIN) 750 MG tablet Take 1 tablet (750 mg total) by mouth daily. Patient not taking: Reported on 02/13/2019 01/04/19   Maximiano Coss, NP  methocarbamol (ROBAXIN) 500 MG tablet Take 1 in the morning, 1 in the afternoon, and 1 or 2 at bedtime as needed for muscle relaxant. 02/13/19   Posey Boyer, MD  Multiple Vitamins-Minerals (BARIATRIC MULTIVITAMINS/IRON PO) Take 1 tablet by mouth 3 (three) times daily.    [provider]  nystatin (MYCOSTATIN) 100000 UNIT/ML suspension Take 5 mLs (500,000 Units total) by mouth 4 (four) times daily. 01/09/19   Maximiano Coss, NP  Omega-3 Fatty Acids (OMEGA-3 FISH OIL PO) Take by mouth daily.    [provider]  ondansetron (ZOFRAN ODT) 4 MG disintegrating tablet Take 1 tablet (4 mg total) by mouth every 8 (eight) hours as needed for nausea or vomiting. 01/06/19   Maximiano Coss, NP  OVER THE COUNTER MEDICATION Meca root with xulane patch for hormone and premenopausal sx (wears all week).    [provider]  OVER THE COUNTER MEDICATION Calcium, magnesium, zinc with D3 (one tablet daily)    [provider]  OVER THE COUNTER MEDICATION Collagen and vitamin c and biotin (takes one daily)    [provider]  [place] for muscle and joint health    [provider]  polyethylene glycol (MIRALAX / GLYCOLAX) packet Take 17 g by mouth daily as needed (for constipation.).     [provider]  predniSONE (DELTASONE) 20 MG tablet Take 3 daily for 2 days, then 2 daily for 2 days, then 1 daily for 2 days as directed for neck and shoulder. 02/13/19   Posey Boyer, MD  PREMARIN  vaginal cream  11/28/18   [provider]  simethicone (MYLICON) 80 MG chewable tablet Chew 80 mg by mouth every 6 (six) hours as needed for flatulence.    [provider]  topiramate (TOPAMAX) 100 MG tablet Take 1.5 tablets (150 mg total) by mouth daily. 01/09/19   Lomax, Amy, NP  triamterene-hydrochlorothiazide (MAXZIDE-25) 37.5-25 MG tablet Take 1 tablet by mouth daily. 11/28/18   Rutherford Guys, MD  [name] 150-35 MCG/24HR transdermal patch  11/18/18   [provider]    Past Medical History:  Diagnosis Date  . Alopecia   . Anxiety   . Autoimmune disease ([place])    "Alopecia"  . Bacterial vaginosis   . Cyst of spleen   . Depression   . Frequent UTI   . Hypertrophy of breast   . IIH (idiopathic intracranial hypertension) 2017   "related to headaches"  . Migraine headache    denies, ruled out - Propranolol.Using for headaches    Past Surgical History:  Procedure Laterality Date  . APPENDECTOMY    . BREAST EXCISIONAL BIOPSY Right 2017   removed at time of reduction  . BREAST REDUCTION SURGERY Bilateral 03/26/2015   Procedure: BILATERAL BREAST REDUCTION  ;  Surgeon: Youlanda Roys, MD;   Location: [facility];  Service: Plastics;  Laterality: Bilateral;  . BREATH TEK H PYLORI N/A 05/21/2014   Procedure: BREATH TEK H PYLORI;  Surgeon: Greer Pickerel, MD;  Location: [facility];  Service: General;  Laterality: N/A;  . CELIAC PLEXUS BLOCK  07/18/2018   Per patient it was done at [facility]    . CESAREAN SECTION    . CHOLECYSTECTOMY    . DILITATION & CURRETTAGE/HYSTROSCOPY WITH NOVASURE ABLATION N/A 07/19/2012   Procedure: HYSTEROSCOPY WITH NOVASURE ABLATION;  Surgeon: Farrel Gobble. Harrington Challenger, MD;  Location: [facility];  Service: Gynecology;  Laterality: N/A;  . ENDOMETRIAL ABLATION    . ESOPHAGOGASTRODUODENOSCOPY N/A 08/16/2015   Procedure: UPPER ESOPHAGOGASTRODUODENOSCOPY (EGD);  Surgeon: Greer Pickerel, MD;  Location: [facility];  Service: General;  Laterality: N/A;  . ESOPHAGOGASTRODUODENOSCOPY N/A 02/21/2016   Procedure: ESOPHAGOGASTRODUODENOSCOPY (EGD);  Surgeon: Greer Pickerel, MD;  Location: [facility];  Service: General;  Laterality: N/A;  . ESOPHAGOGASTRODUODENOSCOPY N/A 02/24/2017   Procedure: ESOPHAGOGASTRODUODENOSCOPY (EGD);  Surgeon: Greer Pickerel, MD;  Location: [facility];  Service: General;  Laterality: N/A;  . IR GENERIC HISTORICAL  10/04/2015   IR [place] Vivianne Master 10/04/2015 Markus Daft, MD [facility]  . IR GENERIC HISTORICAL  01/31/2016   IR [place] GASTRO/COLONIC TUBE PERCUT W/FLUORO 01/31/2016 Corrie Mckusick, DO [facility]  . IR GENERIC HISTORICAL  04/06/2016   IR [place] TUBE PERCUT W/FLUORO 04/06/2016 Arne Cleveland, MD [facility]  . IR GENERIC HISTORICAL  05/14/2016   IR GASTRIC TUBE PERC CHG W/O IMG GUIDE 05/14/2016 Sandi Mariscal, MD [facility]  . LAPAROSCOPIC GASTROSTOMY N/A 09/30/2015   Procedure: LAPAROSCOPIC GASTROSTOMY TUBE PLACEMENT;  Surgeon: Greer Pickerel, MD;  Location: [facility];  Service: General;  Laterality: N/A;  . LAPAROSCOPIC ROUX-EN-Y GASTRIC BYPASS WITH HIATAL HERNIA REPAIR N/A 07/08/2015    Procedure: LAPAROSCOPIC ROUX-EN-Y GASTRIC BYPASS WITH HIATAL HERNIA REPAIR WITH UPPER ENDOSCOPY;  Surgeon: Greer Pickerel, MD;  Location: [facility];  Service: General;  Laterality: N/A;  . LAPAROSCOPIC SMALL BOWEL RESECTION N/A 09/30/2015   Procedure: LAPAROSCOPIC REVISION OF ROUX LIMB;  Surgeon: Greer Pickerel, MD;  Location: [facility];  Service: General;  Laterality: N/A;  . LAPAROSCOPY N/A 09/30/2015  Procedure: LAPAROSCOPY DIAGNOSTIC;  Surgeon: Greer Pickerel, MD;  Location: [facility];  Service: General;  Laterality: N/A;  . REDUCTION MAMMAPLASTY Bilateral 2017  . TUBAL LIGATION    . WISDOM TOOTH EXTRACTION      Social History   Tobacco Use  . Smoking status: Never Smoker  . Smokeless tobacco: Never Used  Substance Use Topics  . Alcohol use: Not Currently    Family History  Problem Relation Age of Onset  . Cancer Mother        late 81'-50  . Breast cancer Mother        late 33'-50  . Stroke Father   . Diabetes Brother   . Seizures Son   . Breast cancer Maternal Aunt        late 40'-50  . Breast cancer Maternal Aunt        late 40'-50  . Breast cancer Maternal Aunt        late 40'-50  . Migraines Neg Hx     ROS Per hpi  OBJECTIVE:  Today's Vitals   02/28/19 0812  BP: 106/67  Pulse: 96  Temp: 97.6 F (36.4 C)  SpO2: 98%  Weight: 195 lb (88.5 kg)  Height: 5\' 8"  (1.727 m)   Body mass index is 29.65 kg/m.   Physical Exam Vitals and nursing note reviewed.  Constitutional:      Appearance: She is well-developed.  HENT:     Head: Normocephalic and atraumatic.     Mouth/Throat:     Pharynx: No oropharyngeal exudate.  Eyes:     General: No scleral icterus.    Conjunctiva/sclera: Conjunctivae normal.     Pupils: Pupils are equal, round, and reactive to light.  Cardiovascular:     Rate and Rhythm: Normal rate and regular rhythm.     Heart sounds: Normal heart sounds. No murmur. No friction rub. No gallop.   Pulmonary:     Effort: Pulmonary effort is normal.     Breath  sounds: Normal breath sounds. No wheezing, rhonchi or rales.  Abdominal:     General: Bowel sounds are normal. There is no distension.     Palpations: There is no hepatomegaly or splenomegaly.     Tenderness: There is abdominal tenderness in the epigastric area and left upper quadrant. There is no guarding or rebound.     Hernia: No hernia is present.  Musculoskeletal:     Cervical back: Neck supple.  Skin:    General: Skin is warm and dry.  Neurological:     Mental Status: She is alert and oriented to person, place, and time.     No results found for this or any previous visit (from the past 24 hour(s)).  No results found.   ASSESSMENT and PLAN  1. Adjustment disorder with mixed anxiety and depressed mood Uncontrolled. Starting buspar. Reviewed r/se/b. Pending psych eval. Discussed counseling if not available thru psych.   Regarding adderral, I discussed with her again that I defer to psych and/or neurology for continuing medication if appropriate.  2. Essential hypertension, benign Controlled. Continue current regime.  - TSH - Lipid panel - CMET with GFR  3. Cervical radiculopathy - predniSONE (DELTASONE) 20 MG tablet; Take 3 daily for 2 days, then 2 daily for 2 days, then 1 daily for 2 days as directed for neck and shoulder.  4. Gastroesophageal reflux disease, unspecified whether esophagitis present 5. S/P gastric bypass Starting pepcid. Referring to GI for further eval and treatment sp given complicated  GI history - Ambulatory referral to Gastroenterology  Other orders - busPIRone (BUSPAR) 5 MG tablet; Take 1 tablet (5 mg total) by mouth 3 (three) times daily. - triamterene-hydrochlorothiazide (MAXZIDE-25) 37.5-25 MG tablet; Take 1 tablet by mouth daily. - famotidine (PEPCID) 20 MG tablet; Take 1 tablet (20 mg total) by mouth 2 (two) times daily.  Return in about 3 months (around 05/29/2019).    Rutherford Guys, MD [facility] [place], [place] [address] Ph.  [phone] Fax [phone]

## 2019-02-28 NOTE — Patient Instructions (Signed)
° ° ° °  If you have lab work done today you will be contacted with your lab results within the next 2 weeks.  If you have not heard from us then please contact us. The fastest way to get your results is to register for My Chart. ° ° °IF you received an x-ray today, you will receive an invoice from [facility]. Please contact [facility] at [phone] with questions or concerns regarding your invoice.  ° °IF you received labwork today, you will receive an invoice from [facility]. Please contact [facility] at [phone] with questions or concerns regarding your invoice.  ° °Our billing staff will not be able to assist you with questions regarding bills from these companies. ° °You will be contacted with the lab results as soon as they are available. The fastest way to get your results is to activate your My Chart account. Instructions are located on the last page of this paperwork. If you have not heard from us regarding the results in 2 weeks, please contact this office. °  ° ° ° °

## 2019-03-01 LAB — CMP14+EGFR
ALT: 19 IU/L (ref 0–32)
AST: 27 IU/L (ref 0–40)
Albumin/Globulin Ratio: 1.5 (ref 1.2–2.2)
Albumin: 3.8 g/dL (ref 3.8–4.8)
Alkaline Phosphatase: 86 IU/L (ref 39–117)
BUN/Creatinine Ratio: 19 (ref 9–23)
BUN: 25 mg/dL — ABNORMAL HIGH (ref 6–24)
Bilirubin Total: <0.2 mg/dL (ref 0.0–1.2)
CO2: 22 mmol/L (ref 20–29)
Calcium: 8.9 mg/dL (ref 8.7–10.2)
Chloride: 105 mmol/L (ref 96–106)
Creatinine, Ser: 1.35 mg/dL — ABNORMAL HIGH (ref 0.57–1.00)
GFR calc Af Amer: 53 mL/min/{1.73_m2} — ABNORMAL LOW (ref >59)
GFR calc non Af Amer: 46 mL/min/{1.73_m2} — ABNORMAL LOW (ref >59)
Globulin, Total: 2.6 g/dL (ref 1.5–4.5)
Glucose: 81 mg/dL (ref 65–99)
Potassium: 4 mmol/L (ref 3.5–5.2)
Sodium: 139 mmol/L (ref 134–144)
Total Protein: 6.4 g/dL (ref 6.0–8.5)

## 2019-03-01 LAB — TSH: TSH: 1.31 u[IU]/mL (ref 0.450–4.500)

## 2019-03-01 LAB — LIPID PANEL
Chol/HDL Ratio: 3.5 ratio (ref 0.0–4.4)
Cholesterol, Total: 207 mg/dL — ABNORMAL HIGH (ref 100–199)
HDL: 60 mg/dL (ref >39)
LDL Chol Calc (NIH): 131 mg/dL — ABNORMAL HIGH (ref 0–99)
Triglycerides: 92 mg/dL (ref 0–149)
VLDL Cholesterol Cal: 16 mg/dL (ref 5–40)

## 2019-03-04 ENCOUNTER — Emergency Department (HOSPITAL_COMMUNITY): Payer: Medicare HMO

## 2019-03-04 ENCOUNTER — Encounter (HOSPITAL_COMMUNITY): Payer: Self-pay | Admitting: Emergency Medicine

## 2019-03-04 ENCOUNTER — Other Ambulatory Visit: Payer: Self-pay

## 2019-03-04 ENCOUNTER — Emergency Department (HOSPITAL_COMMUNITY)
Admission: EM | Admit: 2019-03-04 | Discharge: 2019-03-04 | Disposition: A | Discharge status: Home / Self Care | Payer: Medicare HMO | Attending: Emergency Medicine | Admitting: Emergency Medicine

## 2019-03-04 DIAGNOSIS — R52 Pain, unspecified: Secondary | ICD-10-CM | POA: Diagnosis not present

## 2019-03-04 DIAGNOSIS — S299XXA Unspecified injury of thorax, initial encounter: Secondary | ICD-10-CM | POA: Diagnosis not present

## 2019-03-04 DIAGNOSIS — H65192 Other acute nonsuppurative otitis media, left ear: Secondary | ICD-10-CM | POA: Diagnosis not present

## 2019-03-04 DIAGNOSIS — W19XXXA Unspecified fall, initial encounter: Secondary | ICD-10-CM | POA: Insufficient documentation

## 2019-03-04 DIAGNOSIS — Y939 Activity, unspecified: Secondary | ICD-10-CM | POA: Diagnosis not present

## 2019-03-04 DIAGNOSIS — Y92009 Unspecified place in unspecified non-institutional (private) residence as the place of occurrence of the external cause: Secondary | ICD-10-CM | POA: Insufficient documentation

## 2019-03-04 DIAGNOSIS — S199XXA Unspecified injury of neck, initial encounter: Secondary | ICD-10-CM | POA: Diagnosis not present

## 2019-03-04 DIAGNOSIS — S0990XA Unspecified injury of head, initial encounter: Secondary | ICD-10-CM

## 2019-03-04 DIAGNOSIS — G4489 Other headache syndrome: Secondary | ICD-10-CM | POA: Diagnosis not present

## 2019-03-04 DIAGNOSIS — S0993XA Unspecified injury of face, initial encounter: Secondary | ICD-10-CM | POA: Diagnosis not present

## 2019-03-04 DIAGNOSIS — M542 Cervicalgia: Secondary | ICD-10-CM | POA: Diagnosis not present

## 2019-03-04 DIAGNOSIS — Y999 Unspecified external cause status: Secondary | ICD-10-CM | POA: Diagnosis not present

## 2019-03-04 DIAGNOSIS — S4991XA Unspecified injury of right shoulder and upper arm, initial encounter: Secondary | ICD-10-CM | POA: Diagnosis not present

## 2019-03-04 DIAGNOSIS — I10 Essential (primary) hypertension: Secondary | ICD-10-CM | POA: Diagnosis not present

## 2019-03-04 LAB — CBC
HCT: 41.3 % (ref 36.0–46.0)
Hemoglobin: 13.4 g/dL (ref 12.0–15.0)
MCH: 31.8 pg (ref 26.0–34.0)
MCHC: 32.4 g/dL (ref 30.0–36.0)
MCV: 97.9 fL (ref 80.0–100.0)
Platelets: 282 10*3/uL (ref 150–400)
RBC: 4.22 MIL/uL (ref 3.87–5.11)
RDW: 13.9 % (ref 11.5–15.5)
WBC: 13.2 10*3/uL — ABNORMAL HIGH (ref 4.0–10.5)
nRBC: 0 % (ref 0.0–0.2)

## 2019-03-04 LAB — CBG MONITORING, ED: Glucose-Capillary: 70 mg/dL (ref 70–99)

## 2019-03-04 LAB — I-STAT BETA HCG BLOOD, ED (MC, WL, AP ONLY): I-stat hCG, quantitative: <5 m[IU]/mL (ref <5)

## 2019-03-04 MED ORDER — CEPHALEXIN 500 MG PO CAPS: 500.0000 mg | ORAL_CAPSULE | Freq: Two times a day (BID) | ORAL | 0 refills | Status: AC

## 2019-03-04 MED ORDER — SODIUM CHLORIDE 0.9% FLUSH: 3.0000 mL | Freq: Once | INTRAVENOUS | Status: DC

## 2019-03-04 MED ORDER — OXYCODONE-ACETAMINOPHEN 5-325 MG PO TABS
1.0000 | ORAL_TABLET | Freq: Once | ORAL | Status: AC
  Administered 2019-03-04: 22:00:00 1 via ORAL
  Filled 2019-03-04: qty 1

## 2019-03-04 NOTE — ED Provider Notes (Signed)
[facility] EMERGENCY DEPARTMENT Provider Note   CSN: [id number] Arrival date & time: 03/04/19  1353     History Chief Complaint  Patient presents with  . Fall    Margaret Fletcher is a 48 y.o. female with a past medical history of IIH, autoimmune disease, migraines presents to the ED after fall that occurred prior to arrival.  Fall was unwitnessed.  Patient states that she remembers being in the bathroom and when she woke up she was in the hallway.  She does not remember what happened in between.  States that she has had similar falls in the past without specific trigger.  She is unsure if this is caused by new medication that she has been on as well as a new round of prednisone.  She reports pain to the left side of her face, jaw which is worse with movement of her jaw.  She reports headache as well.  She did have some numbness in her right hand which resolved.  She has ambulated since the fall and denies any numbness in her legs.  Denies any vision changes, vomiting, anticoagulant use, chest pain, shortness of breath or dizziness preceding the fall.  HPI     Past Medical History:  Diagnosis Date  . Alopecia   . Anxiety   . Autoimmune disease ([place])    "Alopecia"  . Bacterial vaginosis   . Cyst of spleen   . Depression   . Frequent UTI   . Hypertrophy of breast   . IIH (idiopathic intracranial hypertension) 2017   "related to headaches"  . Migraine headache    denies, ruled out - Propranolol.Using for headaches    Patient Active Problem List   Diagnosis Date Noted  . Reactive depression 06/13/2017  . Chronic pain syndrome 06/13/2017  . High risk medications (not anticoagulants) long-term use 06/13/2017  . Empty sella syndrome ([place]) 05/01/2017  . Adjustment disorder with mixed anxiety and depressed mood 10/13/2016  . 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 Hypokalemia 02/22/2016  . Constipation, chronic 02/22/2016  . Memory loss due to medical  condition 12/14/2015  . Chronic fatigue 12/14/2015  . Recurrent dehydration 09/30/2015  . Chronic nausea 08/23/2015  . Abdominal pain, chronic, epigastric 08/20/2015  . Protein-calorie malnutrition, moderate ([place]) 08/19/2015  . Mild obstructive sleep apnea 07/08/2015  . Dyslipidemia 07/08/2015  . S/P gastric bypass 07/08/2015  . IIH (idiopathic intracranial hypertension) 06/03/2015  . Vision changes 04/30/2015  . Perceived hearing changes 04/30/2015  . Chronic daily headache 04/21/2014  . Liver lesion 03/21/2013  . Splenic cyst 03/21/2013    Past Surgical History:  Procedure Laterality Date  . APPENDECTOMY    . BREAST EXCISIONAL BIOPSY Right 2017   removed at time of reduction  . BREAST REDUCTION SURGERY Bilateral 03/26/2015   Procedure: BILATERAL BREAST REDUCTION  ;  Surgeon: Youlanda Roys, MD;  Location: [facility];  Service: Plastics;  Laterality: Bilateral;  . BREATH TEK H PYLORI N/A 05/21/2014   Procedure: BREATH TEK H PYLORI;  Surgeon: Greer Pickerel, MD;  Location: [facility];  Service: General;  Laterality: N/A;  . CELIAC PLEXUS BLOCK  07/18/2018   Per patient it was done at [facility]    . CESAREAN SECTION    . CHOLECYSTECTOMY    . DILITATION & CURRETTAGE/HYSTROSCOPY WITH NOVASURE ABLATION N/A 07/19/2012   Procedure: HYSTEROSCOPY WITH NOVASURE ABLATION;  Surgeon: Farrel Gobble. Harrington Challenger, MD;  Location: [facility];  Service: Gynecology;  Laterality: N/A;  . ENDOMETRIAL ABLATION    . ESOPHAGOGASTRODUODENOSCOPY N/A 08/16/2015   Procedure: UPPER ESOPHAGOGASTRODUODENOSCOPY (EGD);  Surgeon: Greer Pickerel, MD;  Location: [facility];  Service: General;  Laterality: N/A;  . ESOPHAGOGASTRODUODENOSCOPY N/A 02/21/2016   Procedure: ESOPHAGOGASTRODUODENOSCOPY (EGD);  Surgeon: Greer Pickerel, MD;  Location: [facility];  Service: General;  Laterality: N/A;  . ESOPHAGOGASTRODUODENOSCOPY N/A 02/24/2017   Procedure: ESOPHAGOGASTRODUODENOSCOPY (EGD);  Surgeon:  Greer Pickerel, MD;  Location: [facility];  Service: General;  Laterality: N/A;  . IR GENERIC HISTORICAL  10/04/2015   IR [place] Vivianne Master 10/04/2015 Markus Daft, MD [facility]  . IR GENERIC HISTORICAL  01/31/2016   IR [place] GASTRO/COLONIC TUBE PERCUT W/FLUORO 01/31/2016 Corrie Mckusick, DO [facility]  . IR GENERIC HISTORICAL  04/06/2016   IR [place] TUBE PERCUT W/FLUORO 04/06/2016 Arne Cleveland, MD [facility]  . IR GENERIC HISTORICAL  05/14/2016   IR GASTRIC TUBE PERC CHG W/O IMG GUIDE 05/14/2016 Sandi Mariscal, MD [facility]  . LAPAROSCOPIC GASTROSTOMY N/A 09/30/2015   Procedure: LAPAROSCOPIC GASTROSTOMY TUBE PLACEMENT;  Surgeon: Greer Pickerel, MD;  Location: [facility];  Service: General;  Laterality: N/A;  . LAPAROSCOPIC ROUX-EN-Y GASTRIC BYPASS WITH HIATAL HERNIA REPAIR N/A 07/08/2015   Procedure: LAPAROSCOPIC ROUX-EN-Y GASTRIC BYPASS WITH HIATAL HERNIA REPAIR WITH UPPER ENDOSCOPY;  Surgeon: Greer Pickerel, MD;  Location: [facility];  Service: General;  Laterality: N/A;  . LAPAROSCOPIC SMALL BOWEL RESECTION N/A 09/30/2015   Procedure: LAPAROSCOPIC REVISION OF ROUX LIMB;  Surgeon: Greer Pickerel, MD;  Location: [facility];  Service: General;  Laterality: N/A;  . LAPAROSCOPY N/A 09/30/2015   Procedure: LAPAROSCOPY DIAGNOSTIC;  Surgeon: Greer Pickerel, MD;  Location: [facility];  Service: General;  Laterality: N/A;  . REDUCTION MAMMAPLASTY Bilateral 2017  . TUBAL LIGATION    . WISDOM TOOTH EXTRACTION       OB History   No obstetric history on file.     Family History  Problem Relation Age of Onset  . Cancer Mother        late 36'-50  . Breast cancer Mother        late 36'-50  . Stroke Father   . Diabetes Brother   . Seizures Son   . Breast cancer Maternal Aunt        late 40'-50  . Breast cancer Maternal Aunt        late 40'-50  . Breast cancer Maternal Aunt        late 40'-50  . Migraines Neg Hx     Social History   Tobacco Use  . Smoking status: Never Smoker  .  Smokeless tobacco: Never Used  Substance Use Topics  . Alcohol use: Not Currently  . Drug use: No    Home Medications Prior to Admission medications   Medication Sig Start Date End Date Taking? Authorizing Provider  albuterol (PROVENTIL HFA;VENTOLIN HFA) 108 (90 Base) MCG/ACT inhaler Inhale 2 puffs into the lungs every 6 (six) hours as needed for wheezing or shortness of breath. 01/25/18   Shawnee Knapp, MD  amitriptyline (ELAVIL) 10 MG tablet TAKE 2 TABLETS BY MOUTH AT BEDTIME. 10/11/18   Rutherford Guys, MD  amphetamine-dextroamphetamine (ADDERALL) 15 MG tablet TAKE 1 TABLET BY MOUTH 2 TIMES DAILY FOR 30 DAYS.(DNF 08/17/18) Patient not taking: Reported on 02/28/2019 10/12/18   Forrest Moron, MD  BAYER MICROLET LANCETS lancets Use as instructed 06/13/17   Shawnee Knapp, MD  Buprenorphine HCl 300 MCG FILM Place 450 mcg  inside cheek every 12 (twelve) hours. Can also take for breakthrough pain per patient     [provider]  busPIRone (BUSPAR) 5 MG tablet Take 1 tablet (5 mg total) by mouth 3 (three) times daily. 02/28/19   Rutherford Guys, MD  CALCIUM-VITAMIN D PO Take 1 tablet by mouth 2 (two) times daily. chewable    [provider]  cephALEXin (KEFLEX) 500 MG capsule Take 1 capsule (500 mg total) by mouth 2 (two) times daily for 7 days. 03/04/19 03/11/19  Talullah Abate, PA-C  Cyanocobalamin (VITAMIN B-12 SL) Place 1 drop under the tongue daily. 1 dropperful    [provider]  cyclobenzaprine (FLEXERIL) 5 MG tablet TAKE 1 TABLET BY MOUTH 3 TIMES DAILY AS NEEDED FOR MUSCLE SPASMS. MAY TAKE 2 TABS BY MOUTH AT BEDTIME 06/17/18   Rutherford Guys, MD  desvenlafaxine (PRISTIQ) 100 MG 24 hr tablet TAKE 1 TABLET BY MOUTH DAILY. 11/22/18   Rutherford Guys, MD  docusate sodium (COLACE) 100 MG capsule Take 100 mg by mouth daily as needed.     [provider]  eletriptan (RELPAX) 40 MG tablet Take 1 tablet (40 mg total) by mouth every 2 (two) hours as needed for migraine or  headache. Do not use >2 doses/24 hours 01/09/19   Lomax, Amy, NP  famotidine (PEPCID) 20 MG tablet Take 1 tablet (20 mg total) by mouth 2 (two) times daily. 02/28/19   Rutherford Guys, MD  glucose blood (CONTOUR NEXT TEST) test strip 1 each by Other route 4 (four) times daily as needed (hypoglycemia). K91.2 06/10/17   Shawnee Knapp, MD  lactulose (CHRONULAC) 10 GM/15ML solution Take 30 g by mouth daily as needed for moderate constipation.  01/14/16   [provider]  methocarbamol (ROBAXIN) 500 MG tablet Take 1 in the morning, 1 in the afternoon, and 1 or 2 at bedtime as needed for muscle relaxant. 02/13/19   Posey Boyer, MD  Multiple Vitamins-Minerals (BARIATRIC MULTIVITAMINS/IRON PO) Take 1 tablet by mouth 3 (three) times daily.    [provider]  nystatin (MYCOSTATIN) 100000 UNIT/ML suspension Take 5 mLs (500,000 Units total) by mouth 4 (four) times daily. 01/09/19   Maximiano Coss, NP  Omega-3 Fatty Acids (OMEGA-3 FISH OIL PO) Take by mouth daily.    [provider]  ondansetron (ZOFRAN ODT) 4 MG disintegrating tablet Take 1 tablet (4 mg total) by mouth every 8 (eight) hours as needed for nausea or vomiting. 01/06/19   Maximiano Coss, NP  OVER THE COUNTER MEDICATION Meca root with xulane patch for hormone and premenopausal sx (wears all week).    [provider]  OVER THE COUNTER MEDICATION Calcium, magnesium, zinc with D3 (one tablet daily)    [provider]  OVER THE COUNTER MEDICATION Collagen and vitamin c and biotin (takes one daily)    [provider]  [place] for muscle and joint health    [provider]  polyethylene glycol (MIRALAX / GLYCOLAX) packet Take 17 g by mouth daily as needed (for constipation.).     [provider]  predniSONE (DELTASONE) 20 MG tablet Take 3 daily for 2 days, then 2 daily for 2 days, then 1 daily for 2 days as directed for neck and shoulder. 02/28/19   Rutherford Guys, MD  PREMARIN vaginal cream  11/28/18   [provider]  simethicone (MYLICON) 80 MG chewable tablet Chew 80 mg by mouth every 6 (six)  hours as needed for flatulence.    [provider]  topiramate (TOPAMAX) 100 MG tablet Take 1.5 tablets (150 mg total) by mouth daily. 01/09/19   Lomax, Amy, NP  triamterene-hydrochlorothiazide (MAXZIDE-25) 37.5-25 MG tablet Take 1 tablet by mouth daily. 02/28/19   Rutherford Guys, MD  [name] 150-35 MCG/24HR transdermal patch  11/18/18   [provider]    Allergies    Sulfa antibiotics, Metoclopramide, and Zosyn [piperacillin sod-tazobactam so]  Review of Systems   Review of Systems  Constitutional: Negative for appetite change, chills and fever.  HENT: Negative for ear pain, rhinorrhea, sneezing and sore throat.        +L sided facial pain  Eyes: Negative for photophobia and visual disturbance.  Respiratory: Negative for cough, chest tightness, shortness of breath and wheezing.   Cardiovascular: Negative for chest pain and palpitations.  Gastrointestinal: Negative for abdominal pain, blood in stool, constipation, diarrhea, nausea and vomiting.  Genitourinary: Negative for dysuria, hematuria and urgency.  Musculoskeletal: Negative for myalgias.  Skin: Negative for rash.  Neurological: Positive for headaches. Negative for dizziness, weakness and light-headedness.    Physical Exam Updated Vital Signs BP 127/81 (BP Location: Right Arm)   Pulse 61   Temp 97.7 F (36.5 C) (Oral)   Resp 12   SpO2 100%   Physical Exam Vitals and nursing note reviewed.  Constitutional:      General: She is not in acute distress.    Appearance: She is well-developed.  HENT:     Head: Normocephalic and atraumatic.     Comments: Tenderness to palpation along the left jawline without deformity or abnormality noted. Pain with opening mouth fully.    Left Ear: A middle ear effusion is present. Tympanic membrane is erythematous.     Nose:  Nose normal.  Eyes:     General: No scleral icterus.       Right eye: No discharge.        Left eye: No discharge.     Extraocular Movements: Extraocular movements intact.     Conjunctiva/sclera: Conjunctivae normal.     Pupils: Pupils are equal, round, and reactive to light.  Neck:   Cardiovascular:     Rate and Rhythm: Normal rate and regular rhythm.     Heart sounds: Normal heart sounds. No murmur. No friction rub. No gallop.   Pulmonary:     Effort: Pulmonary effort is normal. No respiratory distress.     Breath sounds: Normal breath sounds.  Abdominal:     General: Bowel sounds are normal. There is no distension.     Palpations: Abdomen is soft.     Tenderness: There is no abdominal tenderness. There is no guarding.  Musculoskeletal:        General: Normal range of motion.     Cervical back: Normal range of motion and neck supple. Spinous process tenderness and muscular tenderness present.     Right lower leg: No edema.     Left lower leg: No edema.  Skin:    General: Skin is warm and dry.     Findings: No rash.  Neurological:     General: No focal deficit present.     Mental Status: She is alert and oriented to person, place, and time.     Cranial Nerves: No cranial nerve deficit.     Sensory: No sensory deficit.     Motor: No weakness or abnormal muscle tone.     Coordination: Coordination normal.  ED Results / Procedures / Treatments   Labs (all labs ordered are listed, but only abnormal results are displayed) Labs Reviewed  CBC - Abnormal; Notable for the following components:      Result Value   WBC 13.2 (*)    All other components within normal limits  CBG MONITORING, ED  I-STAT BETA HCG BLOOD, ED ([facility], [facility], [facility] ONLY)  CBG MONITORING, ED    EKG EKG Interpretation  Date/Time:  Saturday March 04 2019 14:00:28 EST Ventricular Rate:  65 PR Interval:  144 QRS Duration: 74 QT Interval:  362 QTC Calculation: 376 R Axis:   69 Text  Interpretation: Normal sinus rhythm no acute ST/T changes no significant change since Oct 2019 Confirmed by Sherwood Gambler [phone]) on 03/04/2019 5:59:18 PM   Radiology DG Chest 2 View  Result Date: 03/04/2019 CLINICAL DATA:  48 year old female with fall. EXAM: CHEST - 2 VIEW COMPARISON:  Chest radiograph dated 01/25/2018 FINDINGS: No focal consolidation, pleural effusion, pneumothorax. The cardiac silhouette is within normal limits. No acute osseous pathology. IMPRESSION: No active cardiopulmonary disease. Electronically Signed   By: Anner Crete M.D.   On: 03/04/2019 18:52   DG Shoulder Right  Result Date: 03/04/2019 CLINICAL DATA:  48 year old who fell and injured the RIGHT shoulder. Initial encounter. EXAM: RIGHT SHOULDER - 2+ VIEW COMPARISON:  01/14/2018. FINDINGS: No evidence of acute, subacute or healed fracture. Glenohumeral joint anatomically aligned with well-preserved joint space. Subacromial space well-preserved. Acromioclavicular joint anatomically aligned without significant degenerative changes. Well preserved bone mineral density. No intrinsic osseous abnormality. No interval change. IMPRESSION: Normal examination. Electronically Signed   By: Evangeline Dakin M.D.   On: 03/04/2019 18:52   CT Head Wo Contrast  Result Date: 03/04/2019 CLINICAL DATA:  48 year old female with head trauma. EXAM: CT HEAD WITHOUT CONTRAST CT MAXILLOFACIAL WITHOUT CONTRAST CT CERVICAL SPINE WITHOUT CONTRAST TECHNIQUE: Multidetector CT imaging of the head, cervical spine, and maxillofacial structures were performed using the standard protocol without intravenous contrast. Multiplanar CT image reconstructions of the cervical spine and maxillofacial structures were also generated. COMPARISON:  Head CT dated 08/13/2016. FINDINGS: CT HEAD FINDINGS Brain: The ventricles and sulci appropriate size for patient's age. The gray-white matter discrimination is preserved. There is no acute intracranial hemorrhage. No  mass effect or midline shift no extra-axial fluid collection. Vascular: No hyperdense vessel or unexpected calcification. Skull: Normal. Negative for fracture or focal lesion. Other: None CT MAXILLOFACIAL FINDINGS Osseous: No fracture or mandibular dislocation. No destructive process. Orbits: Negative. No traumatic or inflammatory finding. Sinuses: Small bilateral maxillary sinus retention cysts or polyps. No air-fluid level. The mastoid air cells are clear. Soft tissues: Negative. CT CERVICAL SPINE FINDINGS Alignment: No acute subluxation. There is straightening of normal cervical lordosis which may be positional or due to muscle spasm. Skull base and vertebrae: No acute fracture. No primary bone lesion or focal pathologic process. Soft tissues and spinal canal: No prevertebral fluid or swelling. No visible canal hematoma. Disc levels: No acute findings. No significant degenerative changes. Upper chest: Negative. Other: None IMPRESSION: 1. Normal unenhanced CT of the brain. 2. No acute/traumatic cervical spine pathology. 3. No facial bone fractures. Electronically Signed   By: Anner Crete M.D.   On: 03/04/2019 19:29   CT Cervical Spine Wo Contrast  Result Date: 03/04/2019 CLINICAL DATA:  48 year old female with head trauma. EXAM: CT HEAD WITHOUT CONTRAST CT MAXILLOFACIAL WITHOUT CONTRAST CT CERVICAL SPINE WITHOUT CONTRAST TECHNIQUE: Multidetector CT imaging of the head, cervical spine, and maxillofacial structures were  performed using the standard protocol without intravenous contrast. Multiplanar CT image reconstructions of the cervical spine and maxillofacial structures were also generated. COMPARISON:  Head CT dated 08/13/2016. FINDINGS: CT HEAD FINDINGS Brain: The ventricles and sulci appropriate size for patient's age. The gray-white matter discrimination is preserved. There is no acute intracranial hemorrhage. No mass effect or midline shift no extra-axial fluid collection. Vascular: No hyperdense  vessel or unexpected calcification. Skull: Normal. Negative for fracture or focal lesion. Other: None CT MAXILLOFACIAL FINDINGS Osseous: No fracture or mandibular dislocation. No destructive process. Orbits: Negative. No traumatic or inflammatory finding. Sinuses: Small bilateral maxillary sinus retention cysts or polyps. No air-fluid level. The mastoid air cells are clear. Soft tissues: Negative. CT CERVICAL SPINE FINDINGS Alignment: No acute subluxation. There is straightening of normal cervical lordosis which may be positional or due to muscle spasm. Skull base and vertebrae: No acute fracture. No primary bone lesion or focal pathologic process. Soft tissues and spinal canal: No prevertebral fluid or swelling. No visible canal hematoma. Disc levels: No acute findings. No significant degenerative changes. Upper chest: Negative. Other: None IMPRESSION: 1. Normal unenhanced CT of the brain. 2. No acute/traumatic cervical spine pathology. 3. No facial bone fractures. Electronically Signed   By: Anner Crete M.D.   On: 03/04/2019 19:29   CT Maxillofacial Wo Contrast  Result Date: 03/04/2019 CLINICAL DATA:  48 year old female with head trauma. EXAM: CT HEAD WITHOUT CONTRAST CT MAXILLOFACIAL WITHOUT CONTRAST CT CERVICAL SPINE WITHOUT CONTRAST TECHNIQUE: Multidetector CT imaging of the head, cervical spine, and maxillofacial structures were performed using the standard protocol without intravenous contrast. Multiplanar CT image reconstructions of the cervical spine and maxillofacial structures were also generated. COMPARISON:  Head CT dated 08/13/2016. FINDINGS: CT HEAD FINDINGS Brain: The ventricles and sulci appropriate size for patient's age. The gray-white matter discrimination is preserved. There is no acute intracranial hemorrhage. No mass effect or midline shift no extra-axial fluid collection. Vascular: No hyperdense vessel or unexpected calcification. Skull: Normal. Negative for fracture or focal  lesion. Other: None CT MAXILLOFACIAL FINDINGS Osseous: No fracture or mandibular dislocation. No destructive process. Orbits: Negative. No traumatic or inflammatory finding. Sinuses: Small bilateral maxillary sinus retention cysts or polyps. No air-fluid level. The mastoid air cells are clear. Soft tissues: Negative. CT CERVICAL SPINE FINDINGS Alignment: No acute subluxation. There is straightening of normal cervical lordosis which may be positional or due to muscle spasm. Skull base and vertebrae: No acute fracture. No primary bone lesion or focal pathologic process. Soft tissues and spinal canal: No prevertebral fluid or swelling. No visible canal hematoma. Disc levels: No acute findings. No significant degenerative changes. Upper chest: Negative. Other: None IMPRESSION: 1. Normal unenhanced CT of the brain. 2. No acute/traumatic cervical spine pathology. 3. No facial bone fractures. Electronically Signed   By: Anner Crete M.D.   On: 03/04/2019 19:29    Procedures Procedures (including critical care time)  Medications Ordered in ED Medications  sodium chloride flush (NS) 0.9 % injection 3 mL (3 mLs Intravenous Not Given 03/04/19 2149)  oxyCODONE-acetaminophen (PERCOCET/ROXICET) 5-325 MG per tablet 1 tablet (1 tablet Oral Given 03/04/19 2149)    ED Course  I have reviewed the triage vital signs and the nursing notes.  Pertinent labs & imaging results that were available during my care of the patient were reviewed by me and considered in my medical decision making (see chart for details).    MDM Rules/Calculators/A&P  48 year old female presents to ED after fall that occurred prior to arrival.  States that she has syncopal episodes in the past without specific trigger.  She believes this happened today.  Reports pain to the left side of her face and jaw.  On exam she has no deficits neurological exam noted.  She is alert and oriented x4.  Moving all extremities without  difficulty.  Vital signs are within normal limits.  She does have tenderness palpation of the left jaw and pain with opening her mouth although she is speaking in a normal voice.  CT of the head, maxillofacial and cervical spine are unremarkable.  Chest x-ray is unremarkable.  EKG shows no changes from prior tracings.  Shoulder x-ray is unremarkable as well.  Patient's pain improved here.  She is complaining of left ear pain ongoing left jaw pain.  I stressed to patient that the CT has no abnormal findings, findings of dislocation or fracture of the jaw.  She does have evidence of otitis media in the left TM so we will treat for this.  Question whether this is because of her pain and discomfort.  Nevertheless we will have her follow-up with PCP, take muscle relaxer which she has at home as well as pain medication.  States that she has tolerated p.o. penicillin in the past but chart review shows that she has tolerated cephalosporins without difficulty so we will treat with Keflex.  Patient is hemodynamically stable, in NAD, and able to ambulate in the ED. Evaluation does not show pathology that would require ongoing emergent intervention or inpatient treatment. I explained the diagnosis to the patient. Pain has been managed and has no complaints prior to discharge. Patient is comfortable with above plan and is stable for discharge at this time. All questions were answered prior to disposition. Strict return precautions for returning to the ED were discussed. Encouraged follow up with PCP.   An After Visit Summary was printed and given to the patient.   Portions of this note were generated with [employer]. Dictation errors may occur despite best attempts at proofreading.  Final Clinical Impression(s) / ED Diagnoses Final diagnoses:  Fall, initial encounter  Injury of head, initial encounter  Other acute nonsuppurative otitis media of left ear, recurrence not specified    Rx / DC  Orders ED Discharge Orders         Ordered    cephALEXin (KEFLEX) 500 MG capsule  2 times daily     03/04/19 2220           Delia Heady, PA-C 03/04/19 2227    Sherwood Gambler, MD 03/05/19 1520

## 2019-03-04 NOTE — ED Notes (Signed)
Patient transported to X-ray 

## 2019-03-04 NOTE — ED Notes (Signed)
Pt verbalized understanding of discharge instructions, follow up care and prescriptions reviewed. Pt brought to lobby to wait for ride.

## 2019-03-04 NOTE — ED Triage Notes (Signed)
Pt to triage via [facility] from home.  EMS called out for unwitnessed fall.   Pt reported striking L side of face on wall and states her medications make her unsteady.  States it feels like jaw is broke and has difficulty taking and swallowing.  No obvious injury.  C-collar in place by EMS.

## 2019-03-04 NOTE — ED Notes (Signed)
Patient daughter Marcille Blanco calling asking for an update on patient [phone]

## 2019-03-04 NOTE — Discharge Instructions (Addendum)
Take the antibiotics as directed. Follow instructions regarding head injury precautions. Return to the ED if you start to have worsening symptoms, chest pain, shortness of breath, numbness in arms or legs.

## 2019-03-06 MED FILL — CEPHALEXIN 500 MG CAPSULE: 500 | 7 days supply | Qty: 14 | Fill #0

## 2019-03-08 ENCOUNTER — Telehealth: Payer: Self-pay | Admitting: *Deleted

## 2019-03-08 NOTE — Telephone Encounter (Signed)
Received fax from [facility] stating Elitriptan HBR 40 mg tabs is either not covered or is subject tolimits. Started  PA on [facility], key: [id number] - PA Case ID: [id number]. Received message : Patient not found. Called [employer] clinical pharmacy review, spoke with Ray to initiate PA on phone. He stated Eletriptan is not on formulary, preferred are sumatriptan, naratriptan, rizatriptan. I advised him she has failed rizatriptan and sumatriptan.  He stated he will send for clinical review, decision within 24-72 hours by fax, or can call Ph [phone]. Case # [id number].

## 2019-03-12 ENCOUNTER — Encounter: Payer: Self-pay | Admitting: Family Medicine

## 2019-03-15 ENCOUNTER — Encounter: Payer: Self-pay | Admitting: *Deleted

## 2019-03-15 NOTE — Telephone Encounter (Signed)
Have not received decision on eletriptan form [employer]. Called [employer] through automated system, Eletriptan was denied. [employer] will fax denial letter. Notified patient via my chart.

## 2019-03-16 ENCOUNTER — Ambulatory Visit: Payer: Medicare HMO | Admitting: Family Medicine

## 2019-03-17 ENCOUNTER — Ambulatory Visit (INDEPENDENT_AMBULATORY_CARE_PROVIDER_SITE_OTHER): Payer: Medicare HMO | Admitting: Adult Health Nurse Practitioner

## 2019-03-17 ENCOUNTER — Encounter: Payer: Self-pay | Admitting: Adult Health Nurse Practitioner

## 2019-03-17 ENCOUNTER — Other Ambulatory Visit: Payer: Self-pay

## 2019-03-17 VITALS — BP 106/80 | HR 94 | Temp 97.7°F | Ht 68.0 in | Wt 183.4 lb

## 2019-03-17 DIAGNOSIS — G894 Chronic pain syndrome: Secondary | ICD-10-CM

## 2019-03-17 DIAGNOSIS — H6591 Unspecified nonsuppurative otitis media, right ear: Secondary | ICD-10-CM | POA: Diagnosis not present

## 2019-03-17 DIAGNOSIS — H6590 Unspecified nonsuppurative otitis media, unspecified ear: Secondary | ICD-10-CM

## 2019-03-17 DIAGNOSIS — S0993XD Unspecified injury of face, subsequent encounter: Secondary | ICD-10-CM | POA: Diagnosis not present

## 2019-03-17 DIAGNOSIS — M50123 Cervical disc disorder at C6-C7 level with radiculopathy: Secondary | ICD-10-CM

## 2019-03-17 DIAGNOSIS — M4712 Other spondylosis with myelopathy, cervical region: Secondary | ICD-10-CM | POA: Diagnosis not present

## 2019-03-17 DIAGNOSIS — M62838 Other muscle spasm: Secondary | ICD-10-CM | POA: Diagnosis not present

## 2019-03-17 DIAGNOSIS — M545 Low back pain, unspecified: Secondary | ICD-10-CM

## 2019-03-17 DIAGNOSIS — K1370 Unspecified lesions of oral mucosa: Secondary | ICD-10-CM

## 2019-03-17 DIAGNOSIS — E860 Dehydration: Secondary | ICD-10-CM | POA: Diagnosis not present

## 2019-03-17 DIAGNOSIS — S0993XA Unspecified injury of face, initial encounter: Secondary | ICD-10-CM

## 2019-03-17 HISTORY — DX: Unspecified nonsuppurative otitis media, unspecified ear: H65.90

## 2019-03-17 HISTORY — DX: Unspecified injury of face, initial encounter: S09.93XA

## 2019-03-17 HISTORY — DX: Unspecified lesions of oral mucosa: K13.70

## 2019-03-17 MED ORDER — METHOCARBAMOL 750 MG PO TABS: 750.0000 mg | ORAL_TABLET | Freq: Four times a day (QID) | ORAL | 0 refills | Status: DC | PRN

## 2019-03-17 MED ORDER — CHLORHEXIDINE GLUCONATE 0.12 % MT SOLN: 15.0000 mL | Freq: Two times a day (BID) | OROMUCOSAL | 0 refills | Status: AC

## 2019-03-17 MED ORDER — FLUCONAZOLE 150 MG PO TABS: 150.0000 mg | ORAL_TABLET | Freq: Once | ORAL | 0 refills | Status: DC

## 2019-03-17 MED ORDER — CEFDINIR 300 MG PO CAPS: 300.0000 mg | ORAL_CAPSULE | Freq: Two times a day (BID) | ORAL | 0 refills | Status: AC

## 2019-03-17 MED ORDER — PREDNISONE 20 MG PO TABS: ORAL_TABLET | ORAL | 0 refills | Status: DC

## 2019-03-17 MED FILL — METHOCARBAMOL 750 MG TABS: 750 | 7 days supply | Qty: 30 | Fill #0

## 2019-03-17 MED FILL — CHLORHEXIDINE 0.12% RINSE: 0.12 | 16 days supply | Qty: 473 | Fill #0

## 2019-03-17 MED FILL — CEFDINIR 300 MG CAPSULE: 300 | 10 days supply | Qty: 20 | Fill #0

## 2019-03-17 MED FILL — FLUCONAZOLE 150 MG TABS: 150 | 1 days supply | Qty: 1 | Fill #0

## 2019-03-17 MED FILL — predniSONE 20 MG TABS: 20 | 22 days supply | Qty: 22 | Fill #0

## 2019-03-17 NOTE — Patient Instructions (Addendum)
If you have lab work done today you will be contacted with your lab results within the next 2 weeks.  If you have not heard from [name] then please contact us. The fastest way to get your results is to register for My Chart.   IF you received an x-ray today, you will receive an invoice from [facility]. Please contact [facility] at [phone] with questions or concerns regarding your invoice.   IF you received labwork today, you will receive an invoice from [facility]. Please contact [facility] at [phone] with questions or concerns regarding your invoice.   Our billing staff will not be able to assist you with questions regarding bills from these companies.  You will be contacted with the lab results as soon as they are available. The fastest way to get your results is to activate your My Chart account. Instructions are located on the last page of this paperwork. If you have not heard from [name] regarding the results in 2 weeks, please contact this office.     Dehydration, Adult Dehydration is a condition in which there is not enough water or other fluids in the body. This happens when a person loses more fluids than he or she takes in. Important organs, such as the kidneys, brain, and heart, cannot function without a proper amount of fluids. Any loss of fluids from the body can lead to dehydration. Dehydration can be mild, moderate, or severe. It should be treated right away to prevent it from becoming severe. What are the causes? Dehydration may be caused by:  Conditions that cause loss of water or other fluids, such as diarrhea, vomiting, or sweating or urinating a lot.  Not drinking enough fluids, especially when you are ill or doing activities that require a lot of energy.  Other illnesses and conditions, such as fever or infection.  Certain medicines, such as medicines that remove excess fluid from the body (diuretics).  Lack of safe drinking water.  Not  being able to get enough water and food. What increases the risk? The following factors may make you more likely to develop this condition:  Having a long-term (chronic) illness that has not been treated properly, such as diabetes, heart disease, or kidney disease.  Being 91 years of age or older.  Having a disability.  Living in a place that is high in altitude, where thinner, drier air causes more fluid loss.  Doing exercises that put stress on your body for a long time (endurance sports). What are the signs or symptoms? Symptoms of dehydration depend on how severe it is. Mild or moderate dehydration  Thirst.  Dry lips or dry mouth.  Dizziness or light-headedness, especially when standing up from a seated position.  Muscle cramps.  Dark urine. Urine may be the color of tea.  Less urine or tears produced than usual.  Headache. Severe dehydration  Changes in skin. Your skin may be cold and clammy, blotchy, or pale. Your skin also may not return to normal after being lightly pinched and released.  Little or no tears, urine, or sweat.  Changes in vital signs, such as rapid breathing and low blood pressure. Your pulse may be weak or may be faster than 100 beats a minute when you are sitting still.  Other changes, such as: ? Feeling very thirsty. ? Sunken eyes. ? Cold hands and feet. ? Confusion. ? Being very tired (lethargic) or having trouble waking from sleep. ? Short-term weight loss. ? Loss  of consciousness. How is this diagnosed? This condition is diagnosed based on your symptoms and a physical exam. You may have blood and urine tests to help confirm the diagnosis. How is this treated? Treatment for this condition depends on how severe it is. Treatment should be started right away. Do not wait until dehydration becomes severe. Severe dehydration is an emergency and needs to be treated in a hospital.  Mild or moderate dehydration can be treated at home. You may be  asked to: ? Drink more fluids. ? Drink an oral rehydration solution (ORS). This drink helps restore proper amounts of fluids and salts and minerals in the blood (electrolytes).  Severe dehydration can be treated: ? With IV fluids. ? By correcting abnormal levels of electrolytes. This is often done by giving electrolytes through a tube that is passed through your nose and into your stomach (nasogastric tube, or NG tube). ? By treating the underlying cause of dehydration. Follow these instructions at home: Oral rehydration solution If told by your health care provider, drink an ORS:  Make an ORS by following instructions on the package.  Start by drinking small amounts, about  cup (120 mL) every 5-10 minutes.  Slowly increase how much you drink until you have taken the amount recommended by your health care provider. Eating and drinking         Drink enough clear fluid to keep your urine pale yellow. If you were told to drink an ORS, finish the ORS first and then start slowly drinking other clear fluids. Drink fluids such as: ? Water. Do not drink only water. Doing that can lead to hyponatremia, which is having too little salt (sodium) in the body. ? Water from ice chips you suck on. ? Fruit juice that you have added water to (diluted fruit juice). ? Low-calorie sports drinks.  Eat foods that contain a healthy balance of electrolytes, such as bananas, oranges, potatoes, tomatoes, and spinach.  Do not drink alcohol.  Avoid the following: ? Drinks that contain a lot of sugar. These include high-calorie sports drinks, fruit juice that is not diluted, and soda. ? Caffeine. ? Foods that are greasy or contain a lot of fat or sugar. General instructions  Take over-the-counter and prescription medicines only as told by your health care provider.  Do not take sodium tablets. Doing that can lead to having too much sodium in the body (hypernatremia).  Return to your normal activities  as told by your health care provider. Ask your health care provider what activities are safe for you.  Keep all follow-up visits as told by your health care provider. This is important. Contact a health care provider if:  You have muscle cramps, pain, or discomfort, such as: ? Pain in your abdomen and the pain gets worse or stays in one area (localizes). ? Stiff neck.  You have a rash.  You are more irritable than usual.  You are sleepier or have a harder time waking than usual.  You feel weak or dizzy.  You feel very thirsty. Get help right away if you have:  Any symptoms of severe dehydration.  Symptoms of vomiting, such as: ? You cannot eat or drink without vomiting. ? Vomiting gets worse or does not go away. ? Vomit includes blood or green matter (bile).  Symptoms that get worse with treatment.  A fever.  A severe headache.  Problems with urination or bowel movements, such as: ? Diarrhea that gets worse or does not go  away. ? Blood in your stool (feces). This may cause stool to look black and tarry. ? Not urinating, or urinating only a small amount of very dark urine, within 6-8 hours.  Trouble breathing. These symptoms may represent a serious problem that is an emergency. Do not wait to see if the symptoms will go away. Get medical help right away. Call your local emergency services (911 in the [place] Do not drive yourself to the hospital. Summary  Dehydration is a condition in which there is not enough water or other fluids in the body. This happens when a person loses more fluids than he or she takes in.  Treatment for this condition depends on how severe it is. Treatment should be started right away. Do not wait until dehydration becomes severe.  Drink enough clear fluid to keep your urine pale yellow. If you were told to drink an oral rehydration solution (ORS), finish the ORS first and then start slowly drinking other clear fluids.  Take over-the-counter and  prescription medicines only as told by your health care provider.  Get help right away if you have any symptoms of severe dehydration. This information is not intended to replace advice given to you by your health care provider. Make sure you discuss any questions you have with your health care provider. Document Revised: 10/06/2018 Document Reviewed: 10/06/2018 Elsevier Patient Education  [place].  Cervical Radiculopathy  Cervical radiculopathy happens when a nerve in the neck (a cervical nerve) is pinched or bruised. This condition can happen because of an injury to the cervical spine (vertebrae) in the neck, or as part of the normal aging process. Pressure on the cervical nerves can cause pain or numbness that travels from the neck all the way down into the arm and fingers. Usually, this condition gets better with rest. Treatment may be needed if the condition does not improve. What are the causes? This condition may be caused by:  A neck injury.  A bulging (herniated) disk.  Muscle spasms.  Muscle tightness in the neck because of overuse.  Arthritis.  Breakdown or degeneration in the bones and joints of the spine (spondylosis) due to aging.  Bone spurs that may develop near the cervical nerves. What are the signs or symptoms? Symptoms of this condition include:  Pain. The pain may travel from the neck to the arm and hand. The pain can be severe or irritating. It may be worse when you move your neck.  Numbness or tingling in your arm or hand.  Weakness in the affected arm and hand, in severe cases. How is this diagnosed? This condition may be diagnosed based on your symptoms, your medical history, and a physical exam. You may also have tests, including:  X-rays.  A CT scan.  An MRI.  An electromyogram (EMG).  Nerve conduction tests. How is this treated? In many cases, treatment is not needed for this condition. With rest, the condition usually gets better  over time. If treatment is needed, options may include:  Wearing a soft neck collar (cervical collar) for short periods of time, as told by your health care provider.  Doing physical therapy to strengthen your neck muscles.  Taking medicines, such as NSAIDs or oral corticosteroids.  Having spinal injections, in severe cases.  Having surgery. This may be needed if other treatments do not help. Different types of surgery may be done depending on the cause of this condition. Follow these instructions at home: If you have a cervical  collar:  Wear it as told by your health care provider. Remove it only as told by your health care provider.  Ask your health care provider if you can remove the collar for cleaning and bathing. If you are allowed to remove the collar for cleaning or bathing: ? Follow instructions from your health care provider about how to remove the collar safely. ? Clean the collar by wiping it with mild soap and water and drying it completely. ? Take out any removable pads in the collar every 1-2 days, and wash them by hand with soap and water. Let them air-dry completely before you put them back in the collar. ? Check your skin under the collar for irritation or sores. If you see any, tell your health care provider. Managing pain      Take over-the-counter and prescription medicines only as told by your health care provider.  If directed, put ice on the affected area. ? If you have a soft neck collar, remove it as told by your health care provider. ? Put ice in a plastic bag. ? Place a towel between your skin and the bag. ? Leave the ice on for 20 minutes, 2-3 times a day.  If applying ice does not help, you can try using heat. Use the heat source that your health care provider recommends, such as a moist heat pack or a heating pad. ? Place a towel between your skin and the heat source. ? Leave the heat on for 20-30 minutes. ? Remove the heat if your skin turns bright  red. This is especially important if you are unable to feel pain, heat, or cold. You may have a greater risk of getting burned.  Try a gentle neck and shoulder massage to help relieve symptoms. Activity  Rest as needed.  Return to your normal activities as told by your health care provider. Ask your health care provider what activities are safe for you.  Do stretching and strengthening exercises as told by your health care provider or physical therapist.  Do not lift anything that is heavier than 10 lb (4.5 kg) until your health care provider tells you that it is safe. General instructions  Use a flat pillow when you sleep.  Do not drive while wearing a cervical collar. If you do not have a cervical collar, ask your health care provider if it is safe to drive while your neck heals.  Ask your health care provider if the medicine prescribed to you requires you to avoid driving or using heavy machinery.  Do not use any products that contain nicotine or tobacco, such as cigarettes, e-cigarettes, and chewing tobacco. These can delay healing. If you need help quitting, ask your health care provider.  Keep all follow-up visits as told by your health care provider. This is important. Contact a health care provider if:  Your condition does not improve with treatment. Get help right away if:  Your pain gets much worse and cannot be controlled with medicines.  You have weakness or numbness in your hand, arm, face, or leg.  You have a high fever.  You have a stiff, rigid neck.  You lose control of your bowels or your bladder (have incontinence).  You have trouble with walking, balance, or speaking. Summary  Cervical radiculopathy happens when a nerve in the neck is pinched or bruised.  A nerve can get pinched from a bulging disk, arthritis, muscle spasms, or an injury to the neck.  Symptoms include pain,  tingling, or numbness radiating from the neck into the arm or hand. Weakness can  also occur in severe cases.  Treatment may include rest, wearing a cervical collar, and physical therapy. Medicines may be prescribed to help with pain. In severe cases, injections or surgery may be needed. This information is not intended to replace advice given to you by your health care provider. Make sure you discuss any questions you have with your health care provider. Document Revised: 01/14/2018 Document Reviewed: 01/14/2018 Elsevier Patient Education  [place].  Cervical Strain and Sprain Rehab Ask your health care provider which exercises are safe for you. Do exercises exactly as told by your health care provider and adjust them as directed. It is normal to feel mild stretching, pulling, tightness, or discomfort as you do these exercises. Stop right away if you feel sudden pain or your pain gets worse. Do not begin these exercises until told by your health care provider. Stretching and range-of-motion exercises Cervical side bending  1. Using good posture, sit on a stable chair or stand up. 2. Without moving your shoulders, slowly tilt your left / right ear to your shoulder until you feel a stretch in the opposite side neck muscles. You should be looking straight ahead. 3. Hold for __________ seconds. 4. Repeat with the other side of your neck. Repeat __________ times. Complete this exercise __________ times a day. Cervical rotation  1. Using good posture, sit on a stable chair or stand up. 2. Slowly turn your head to the side as if you are looking over your left / right shoulder. ? Keep your eyes level with the ground. ? Stop when you feel a stretch along the side and the back of your neck. 3. Hold for __________ seconds. 4. Repeat this by turning to your other side. Repeat __________ times. Complete this exercise __________ times a day. Thoracic extension and pectoral stretch 1. Roll a towel or a small blanket so it is about 4 inches (10 cm) in diameter. 2. Lie down  on your back on a firm surface. 3. Put the towel lengthwise, under your spine in the middle of your back. It should not be under your shoulder blades. The towel should line up with your spine from your middle back to your lower back. 4. Put your hands behind your head and let your elbows fall out to your sides. 5. Hold for __________ seconds. Repeat __________ times. Complete this exercise __________ times a day. Strengthening exercises Isometric upper cervical flexion 1. Lie on your back with a thin pillow behind your head and a small rolled-up towel under your neck. 2. Gently tuck your chin toward your chest and nod your head down to look toward your feet. Do not lift your head off the pillow. 3. Hold for __________ seconds. 4. Release the tension slowly. Relax your neck muscles completely before you repeat this exercise. Repeat __________ times. Complete this exercise __________ times a day. Isometric cervical extension  1. Stand about 6 inches (15 cm) away from a wall, with your back facing the wall. 2. Place a soft object, about 6-8 inches (15-20 cm) in diameter, between the back of your head and the wall. A soft object could be a small pillow, a ball, or a folded towel. 3. Gently tilt your head back and press into the soft object. Keep your jaw and forehead relaxed. 4. Hold for __________ seconds. 5. Release the tension slowly. Relax your neck muscles completely before you repeat this  exercise. Repeat __________ times. Complete this exercise __________ times a day. Posture and body mechanics Body mechanics refers to the movements and positions of your body while you do your daily activities. Posture is part of body mechanics. Good posture and healthy body mechanics can help to relieve stress in your body's tissues and joints. Good posture means that your spine is in its natural S-curve position (your spine is neutral), your shoulders are pulled back slightly, and your head is not tipped  forward. The following are general guidelines for applying improved posture and body mechanics to your everyday activities. Sitting  1. When sitting, keep your spine neutral and keep your feet flat on the floor. Use a footrest, if necessary, and keep your thighs parallel to the floor. Avoid rounding your shoulders, and avoid tilting your head forward. 2. When working at a desk or a computer, keep your desk at a height where your hands are slightly lower than your elbows. Slide your chair under your desk so you are close enough to maintain good posture. 3. When working at a computer, place your monitor at a height where you are looking straight ahead and you do not have to tilt your head forward or downward to look at the screen. Standing   When standing, keep your spine neutral and keep your feet about hip-width apart. Keep a slight bend in your knees. Your ears, shoulders, and hips should line up.  When you do a task in which you stand in one place for a long time, place one foot up on a stable object that is 2-4 inches (5-10 cm) high, such as a footstool. This helps keep your spine neutral. Resting When lying down and resting, avoid positions that are most painful for you. Try to support your neck in a neutral position. You can use a contour pillow or a small rolled-up towel. Your pillow should support your neck but not push on it. This information is not intended to replace advice given to you by your health care provider. Make sure you discuss any questions you have with your health care provider. Document Revised: 06/15/2018 Document Reviewed: 11/24/2017 Elsevier Patient Education  [place].

## 2019-03-20 ENCOUNTER — Encounter: Payer: Self-pay | Admitting: Family Medicine

## 2019-03-20 ENCOUNTER — Other Ambulatory Visit: Payer: Self-pay | Admitting: Family Medicine

## 2019-03-20 MED FILL — CYCLOBENZAPRINE HCL 5 MG TA: 5 | 30 days supply | Qty: 30 | Fill #1

## 2019-03-20 MED FILL — ONDANSETRON ODT 8 MG TABLET: 8 | 6 days supply | Qty: 20 | Fill #1

## 2019-03-20 MED FILL — BELBUCA 600 MCG FILM: 600 | 30 days supply | Qty: 60 | Fill #1

## 2019-03-20 MED FILL — TRIAMTERENE-HCTZ 37.5-25 MG: 37.5-25 | 90 days supply | Qty: 90 | Fill #0

## 2019-03-20 MED FILL — DESVENLAFAXINE SUC ER 100 M: 100 | 90 days supply | Qty: 90 | Fill #0

## 2019-03-20 NOTE — Telephone Encounter (Signed)
Requested Prescriptions  Pending Prescriptions Disp Refills  . desvenlafaxine (PRISTIQ) 100 MG 24 hr tablet [Pharmacy Med Name: DESVENLAFAXINE SUC ER 100 M 100 Tablet] 90 tablet 0    Sig: TAKE 1 TABLET BY MOUTH ONCE A DAY     Psychiatry: Antidepressants - SNRI - desvenlafaxine & venlafaxine Failed - 03/20/2019  3:14 PM      Failed - LDL in normal range and within 360 days    LDL Chol Calc (NIH)  Date Value Ref Range Status  02/28/2019 131 (H) 0 - 99 mg/dL Final         Failed - Total Cholesterol in normal range and within 360 days    Cholesterol, Total  Date Value Ref Range Status  02/28/2019 207 (H) 100 - 199 mg/dL Final         Passed - Triglycerides in normal range and within 360 days    Triglycerides  Date Value Ref Range Status  02/28/2019 92 0 - 149 mg/dL Final         Passed - Completed PHQ-2 or PHQ-9 in the last 360 days.      Passed - Last BP in normal range    BP Readings from Last 1 Encounters:  03/17/19 106/80         Passed - Valid encounter within last 6 months    Recent Outpatient Visits          3 days ago Low back pain, unspecified back pain laterality, unspecified chronicity, unspecified whether sciatica present   [facility] at [facility], Lorelee Market, NP   2 weeks ago Adjustment disorder with mixed anxiety and depressed mood   [facility] at [facility], Lilia Argue, MD   1 month ago Cervical radiculopathy   [facility] at [facility], Fenton Malling, MD   2 months ago Urinary urgency   [facility], Delfino Lovett, NP   2 months ago Urinary urgency   [facility], Delfino Lovett, NP      Future Appointments            In 1 month Felton Clinton Lorelee Market, NP Primary Care at [place], [facility]   In 2 months Rutherford Guys, MD Primary Care at [place], [facility]

## 2019-03-22 ENCOUNTER — Telehealth: Payer: Self-pay | Admitting: Family Medicine

## 2019-03-22 NOTE — Telephone Encounter (Signed)
Can you please close the note from 03/17/19 so that I can work on the [employer] approval? Thanks

## 2019-03-23 ENCOUNTER — Encounter: Payer: Self-pay | Admitting: Nurse Practitioner

## 2019-03-23 ENCOUNTER — Ambulatory Visit (INDEPENDENT_AMBULATORY_CARE_PROVIDER_SITE_OTHER): Payer: Medicare HMO | Admitting: Nurse Practitioner

## 2019-03-23 VITALS — BP 122/78 | HR 71 | Temp 98.7°F | Ht 68.0 in | Wt 196.0 lb

## 2019-03-23 DIAGNOSIS — K219 Gastro-esophageal reflux disease without esophagitis: Secondary | ICD-10-CM | POA: Diagnosis not present

## 2019-03-23 DIAGNOSIS — K649 Unspecified hemorrhoids: Secondary | ICD-10-CM

## 2019-03-23 DIAGNOSIS — R1013 Epigastric pain: Secondary | ICD-10-CM

## 2019-03-23 DIAGNOSIS — K5900 Constipation, unspecified: Secondary | ICD-10-CM | POA: Diagnosis not present

## 2019-03-23 DIAGNOSIS — K625 Hemorrhage of anus and rectum: Secondary | ICD-10-CM | POA: Diagnosis not present

## 2019-03-23 DIAGNOSIS — Z01818 Encounter for other preprocedural examination: Secondary | ICD-10-CM

## 2019-03-23 MED ORDER — HYDROCORTISONE ACETATE 25 MG RE SUPP: 25.0000 mg | Freq: Every day | RECTAL | 0 refills | Status: AC

## 2019-03-23 MED ORDER — OMEPRAZOLE 20 MG PO CPDR: 20.0000 mg | DELAYED_RELEASE_CAPSULE | ORAL | 1 refills | Status: DC

## 2019-03-23 MED FILL — OMEPRAZOLE 20 MG CAP: 20 | 30 days supply | Qty: 30 | Fill #0

## 2019-03-23 MED FILL — HYDROCORTISONE AC 25 MG SUP: 25 | 5 days supply | Qty: 5 | Fill #0

## 2019-03-23 NOTE — Telephone Encounter (Signed)
Please Advise

## 2019-03-23 NOTE — Patient Instructions (Addendum)
If you are age 50 or older, your body mass index should be between 23-30. Your Body mass index is 29.8 kg/m. If this is out of the aforementioned range listed, please consider follow up with your Primary Care Provider.  If you are age 75 or younger, your body mass index should be between 19-25. Your Body mass index is 29.8 kg/m. If this is out of the aformentioned range listed, please consider follow up with your Primary Care Provider.   You have been scheduled for an endoscopy and colonoscopy. Please follow the written instructions given to you at your visit today. Please pick up your prep supplies at the pharmacy within the next 1-3 days. If you use inhalers (even only as needed), please bring them with you on the day of your procedure. Your physician has requested that you go to www.startemmi.com and enter the access code given to you at your visit today. This web site gives a general overview about your procedure. However, you should still follow specific instructions given to you by our office regarding your preparation for the procedure.  We have sent the following medications to your pharmacy for you to pick up at your convenience: Omeprazole  Anusol Suppositories Continue Pepcid 20 mg one at bedtime.  Use Desitin for hemorrhoids as discussed. Use Miralax at bedtime, Dulcolax 1-2 tabs every third night.  Follow up after EGD/Colon with Dr. Carlean Purl on2/24/21 at 8:30 am  Thank you for choosing me and [facility].

## 2019-03-23 NOTE — Progress Notes (Signed)
03/23/2019 [place] [id number] 01-12-1971   HISTORY OF PRESENT ILLNESS: Margaret Fletcher is a 49 year old female with a past medical history significant for anxiety, depression, idiopathic intracranial hypertension (IIH) 2017.  Status post Roux-en-Y gastric bypass surgery 2017 which was done to reduce her IIH by losing weight. She developed postoperative epigastric pain which became chronic and resulted in failure to thrive.  She required the placement of 3-4 feeding tubes from 03/28/2016 through 12/26/2017.  She stated the last feeding tube was dislodged and was removed 10/2018.  She no longer requires a gastric feeding tube.  She presents today with complaints of having a frequent sore throat, voice hoarseness and a lump sensation in her throat which started a few months ago. No specific heartburn or dysphagia.  She has chronic upper abdominal pain since her gastric bypass surgery.  She describes feeling as if rocks sit inside of her stomach.  She was recently seen by her PCP who prescribed Famotidine 20 mg twice daily. Her throat symptoms have not improved. She denies taking NSAIDs.  She is on chronic narcotics.  She takes Buprenorphine 600 mg p.o. twice daily as prescribed by pain management.  She complains of having constipation.  She passes a hard stool every 4 to 5 days.  She is taking a stool softener daily.  She previously took MiraLAX without significant improvement.  Linzess was ineffective.  She reports having rectal bleeding every time she passes a bowel movement which has occurred for several years she attributes to having hemorrhoids. She describes seeing bright red blood on the toilet tissue sometimes on the stool.  No significant anal rectal pain.  Family history of colorectal cancer.  Father with history of colon polyps.  Mother with history of bilateral breast cancer.  She is currently taking Cefdinir 300 mg 1 p.o. twice daily for total of 10 days due to having fluid behind  the eardrums.   Labs 03/04/2019: WBC 13.2.  Hemoglobin 13.4.  Hematocrit 41.3.  Platelet 282.  EGD 02/24/2017 by Dr. Greer Pickerel: - Normal esophagus. - No specimens collected. - normal appearing roux en ygastric bypass anatomy  EGD 02/21/2016: - Normal esophagus. - normal appearing roux en y anatomy - normal size gastric pouch - scant filmy whitish material in gastric pouch and blind end of roux limb - Biopsies were taken of the gastric pouch with a cold forceps for Helicobacter pylori testing and analysis  EGD 08/16/2015: The esophagus was normal. some mild reflux in distal esophagus; pouch appeared normal size, stomach pouch mucosa no ulcers, no hyperemic mucosa, pouch mucosa may be a little thickened/perhaps pale. 2 visible staples at gastrojejunostomy - surrounding mucosa intact. anastomosis widely patent; able to advance to both efferent & afferent roux limb  Past Medical History:  Diagnosis Date  . Alopecia   . Anxiety   . Autoimmune disease ([place])    "Alopecia"  . Bacterial vaginosis   . Cyst of spleen   . Depression   . Disorder of oral soft tissue 03/17/2019  . Frequent UTI   . Hypertrophy of breast   . IIH (idiopathic intracranial hypertension) 2017   "related to headaches"  . Injury of jawline 03/17/2019  . Migraine headache    denies, ruled out - Propranolol.Using for headaches  . Non-suppurative otitis media 03/17/2019   Past Surgical History:  Procedure Laterality Date  . APPENDECTOMY    . BREAST EXCISIONAL BIOPSY Right 2017   removed at time of reduction  . BREAST  REDUCTION SURGERY Bilateral 03/26/2015   Procedure: BILATERAL BREAST REDUCTION  ;  Surgeon: Youlanda Roys, MD;  Location: [facility];  Service: Plastics;  Laterality: Bilateral;  . BREATH TEK H PYLORI N/A 05/21/2014   Procedure: BREATH TEK H PYLORI;  Surgeon: Greer Pickerel, MD;  Location: [facility];  Service: General;  Laterality: N/A;  . CELIAC PLEXUS BLOCK  07/18/2018   Per  patient it was done at [facility]    . CESAREAN SECTION    . CHOLECYSTECTOMY    . DILITATION & CURRETTAGE/HYSTROSCOPY WITH NOVASURE ABLATION N/A 07/19/2012   Procedure: HYSTEROSCOPY WITH NOVASURE ABLATION;  Surgeon: Farrel Gobble. Harrington Challenger, MD;  Location: [facility];  Service: Gynecology;  Laterality: N/A;  . ENDOMETRIAL ABLATION    . ESOPHAGOGASTRODUODENOSCOPY N/A 08/16/2015   Procedure: UPPER ESOPHAGOGASTRODUODENOSCOPY (EGD);  Surgeon: Greer Pickerel, MD;  Location: [facility];  Service: General;  Laterality: N/A;  . ESOPHAGOGASTRODUODENOSCOPY N/A 02/21/2016   Procedure: ESOPHAGOGASTRODUODENOSCOPY (EGD);  Surgeon: Greer Pickerel, MD;  Location: [facility];  Service: General;  Laterality: N/A;  . ESOPHAGOGASTRODUODENOSCOPY N/A 02/24/2017   Procedure: ESOPHAGOGASTRODUODENOSCOPY (EGD);  Surgeon: Greer Pickerel, MD;  Location: [facility];  Service: General;  Laterality: N/A;  . IR GENERIC HISTORICAL  10/04/2015   IR [place] Vivianne Master 10/04/2015 Markus Daft, MD [facility]  . IR GENERIC HISTORICAL  01/31/2016   IR [place] GASTRO/COLONIC TUBE PERCUT W/FLUORO 01/31/2016 Corrie Mckusick, DO [facility]  . IR GENERIC HISTORICAL  04/06/2016   IR [place] TUBE PERCUT W/FLUORO 04/06/2016 Arne Cleveland, MD [facility]  . IR GENERIC HISTORICAL  05/14/2016   IR GASTRIC TUBE PERC CHG W/O IMG GUIDE 05/14/2016 Sandi Mariscal, MD [facility]  . LAPAROSCOPIC GASTROSTOMY N/A 09/30/2015   Procedure: LAPAROSCOPIC GASTROSTOMY TUBE PLACEMENT;  Surgeon: Greer Pickerel, MD;  Location: [facility];  Service: General;  Laterality: N/A;  . LAPAROSCOPIC ROUX-EN-Y GASTRIC BYPASS WITH HIATAL HERNIA REPAIR N/A 07/08/2015   Procedure: LAPAROSCOPIC ROUX-EN-Y GASTRIC BYPASS WITH HIATAL HERNIA REPAIR WITH UPPER ENDOSCOPY;  Surgeon: Greer Pickerel, MD;  Location: [facility];  Service: General;  Laterality: N/A;  . LAPAROSCOPIC SMALL BOWEL RESECTION N/A 09/30/2015   Procedure: LAPAROSCOPIC REVISION OF ROUX LIMB;   Surgeon: Greer Pickerel, MD;  Location: [facility];  Service: General;  Laterality: N/A;  . LAPAROSCOPY N/A 09/30/2015   Procedure: LAPAROSCOPY DIAGNOSTIC;  Surgeon: Greer Pickerel, MD;  Location: [facility];  Service: General;  Laterality: N/A;  . REDUCTION MAMMAPLASTY Bilateral 2017  . TUBAL LIGATION    . WISDOM TOOTH EXTRACTION      reports that she has never smoked. She has never used smokeless tobacco. She reports previous alcohol use. She reports that she does not use drugs. family history includes Breast cancer in her maternal aunt, maternal aunt, maternal aunt, and mother; Cancer in her mother; Diabetes in her brother; Seizures in her son; Stroke in her father. Allergies  Allergen Reactions  . Sulfa Antibiotics Anaphylaxis  . Metoclopramide Other (See Comments)    Elevated prolactin >5x ULN induction lactation when taking scheduled for several months  . Zosyn [Piperacillin Sod-Tazobactam So] Rash    Has patient had a PCN reaction causing immediate rash, facial/tongue/throat swelling, SOB or lightheadedness with hypotension: No Has patient had a PCN reaction causing severe rash involving mucus membranes or skin necrosis: No Has patient had a PCN reaction that required hospitalization: Unknown--inpatient when reaction occurred Has patient had a PCN reaction occurring within the last 10 years: Yes If  all of the above answers are "NO", then may proceed with Cephalosporin use.       Outpatient Encounter Medications as of 03/23/2019  Medication Sig  . albuterol (PROVENTIL HFA;VENTOLIN HFA) 108 (90 Base) MCG/ACT inhaler Inhale 2 puffs into the lungs every 6 (six) hours as needed for wheezing or shortness of breath.  Marland Kitchen amitriptyline (ELAVIL) 10 MG tablet TAKE 2 TABLETS BY MOUTH AT BEDTIME.  Marland Kitchen BAYER MICROLET LANCETS lancets Use as instructed  . Buprenorphine HCl 300 MCG FILM Place 450 mcg inside cheek every 12 (twelve) hours. Can also take for breakthrough pain per patient   . busPIRone (BUSPAR) 5 MG  tablet Take 1 tablet (5 mg total) by mouth 3 (three) times daily.  Marland Kitchen CALCIUM-VITAMIN D PO Take 1 tablet by mouth 2 (two) times daily. chewable  . cefdinir (OMNICEF) 300 MG capsule Take 1 capsule (300 mg total) by mouth 2 (two) times daily for 10 days.  . chlorhexidine (PERIDEX) 0.12 % solution Use as directed 15 mLs in the mouth or throat 2 (two) times daily.  . Cyanocobalamin (VITAMIN B-12 SL) Place 1 drop under the tongue daily. 1 dropperful  . cyclobenzaprine (FLEXERIL) 5 MG tablet TAKE 1 TABLET BY MOUTH 3 TIMES DAILY AS NEEDED FOR MUSCLE SPASMS. MAY TAKE 2 TABS BY MOUTH AT BEDTIME  . desvenlafaxine (PRISTIQ) 100 MG 24 hr tablet TAKE 1 TABLET BY MOUTH ONCE A DAY  . docusate sodium (COLACE) 100 MG capsule Take 100 mg by mouth daily as needed.   . eletriptan (RELPAX) 40 MG tablet Take 1 tablet (40 mg total) by mouth every 2 (two) hours as needed for migraine or headache. Do not use >2 doses/24 hours  . famotidine (PEPCID) 20 MG tablet Take 1 tablet (20 mg total) by mouth 2 (two) times daily.  Marland Kitchen glucose blood (CONTOUR NEXT TEST) test strip 1 each by Other route 4 (four) times daily as needed (hypoglycemia). K91.2  . lactulose (CHRONULAC) 10 GM/15ML solution Take 30 g by mouth daily as needed for moderate constipation.   . methocarbamol (ROBAXIN-750) 750 MG tablet Take 1 tablet (750 mg total) by mouth every 6 (six) hours as needed for up to 30 doses for muscle spasms.  . Multiple Vitamins-Minerals (BARIATRIC MULTIVITAMINS/IRON PO) Take 1 tablet by mouth 3 (three) times daily.  . Omega-3 Fatty Acids (OMEGA-3 FISH OIL PO) Take by mouth daily.  . ondansetron (ZOFRAN ODT) 4 MG disintegrating tablet Take 1 tablet (4 mg total) by mouth every 8 (eight) hours as needed for nausea or vomiting.  Marland Kitchen OVER THE COUNTER MEDICATION Meca root with xulane patch for hormone and premenopausal sx (wears all week).  Marland Kitchen OVER THE COUNTER MEDICATION Calcium, magnesium, zinc with D3 (one tablet daily)  . OVER THE COUNTER  MEDICATION Collagen and vitamin c and biotin (takes one daily)  . OVER THE COUNTER MEDICATION Tunic for muscle and joint health  . polyethylene glycol (MIRALAX / GLYCOLAX) packet Take 17 g by mouth daily as needed (for constipation.).   Marland Kitchen predniSONE (DELTASONE) 20 MG tablet 3 tabs x 3 days 2 tabs x 4 days 1 tab x 4 days 1/2 tab x 2 days  . PREMARIN vaginal cream   . simethicone (MYLICON) 80 MG chewable tablet Chew 80 mg by mouth every 6 (six) hours as needed for flatulence.  . topiramate (TOPAMAX) 100 MG tablet Take 1.5 tablets (150 mg total) by mouth daily.  Marland Kitchen triamterene-hydrochlorothiazide (MAXZIDE-25) 37.5-25 MG tablet Take 1 tablet by mouth daily.  . [  DISCONTINUED] nystatin (MYCOSTATIN) 100000 UNIT/ML suspension Take 5 mLs (500,000 Units total) by mouth 4 (four) times daily.  . [DISCONTINUED] [name] 150-35 MCG/24HR transdermal patch    No facility-administered encounter medications on file as of 03/23/2019.     REVIEW OF SYSTEMS  : All other systems reviewed and negative except where noted in the History of Present Illness.   PHYSICAL EXAM: BP 122/78   Pulse 71   Temp 98.7 F (37.1 C) (Oral)   Ht 5\' 8"  (1.727 m)   Wt 196 lb (88.9 kg)   BMI 29.80 kg/m  General: Well developed 49 year old female in no acute distress. Head: Normocephalic and atraumatic. Eyes:  Sclerae non-icteric, conjunctive pink. Ears: Normal auditory acuity. Mouth: Dentition intact. No ulcers or lesions.  Neck: Supple, no lymphadenopathy or thyromegaly.  Lungs: Clear bilaterally to auscultation without wheezes, crackles or rhonchi. Heart: Regular rate and rhythm. No murmur, rub or gallop appreciated.  Abdomen: Soft, nontender, non distended. No masses. No hepatosplenomegaly. Normoactive bowel sounds x 4 quadrants. Epigastric and LUQ scars intact. Rectal: Small external hemorrhoid with inflammation or bleeding. Right lateral internal hemorrhoid > Left without prolapse.  Musculoskeletal: Symmetrical with no  gross deformities. Skin: Warm and dry. No rash or lesions on visible extremities. Extremities: No edema. Neurological: Alert oriented x 4, no focal deficits.  Psychological:  Alert and cooperative. Normal mood and affect.  ASSESSMENT AND PLAN:  65.  49 year old female with voice hoarseness and globus sensation presents for further evaluation for possible laryngeal reflux. -Omeprazole 20 mg in the morning.  Famotidine 20 mg at bedtime. -EGD benefits and risk discussed including risk with sedation, bleeding perforation -If EGD negative, consider ENT evaluation   2.  Status post Roux-en-Y gastric bypass surgery in 0000000 complicated by chronic upper abdominal pain, failure to thrive work requiring gastric feeding tube 2018-10/2018.  She is tolerating p.o. intake.  No weight loss.  3.  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  If no improvement will try Movantik. -Increase water and dietary fiber intake  4.  Rectal bleeding.  Internal and external hemorrhoids.  Family history of colon polyps. -Colonoscopy benefits and risk discussed including risk with sedation, risk of bleeding, perforation infection -See plan in #3 -Anusol suppository 1 PR nightly x5 nights, Desitin as needed  Further follow-up to be determined after EGD and colonoscopy completed   CC:  Rutherford Guys, MD

## 2019-03-23 NOTE — Progress Notes (Signed)
Chief Complaint  Patient presents with  . Otalgia    Pt state that she fell and hiit her ear x1 day after Christmas.  Marland Kitchen score 34    HPI   Patient presents with multiple complaints today.  She fell and hit her ear a day after Christmas and her jaw was affected.  She went to the emergency room and no fracture was found.  She is incredibly sore to the left side of her jaw and left shoulder.  She has a history of cervical spondylotic myelopathy and cervical radiculopathy which she feels has made worse.  She has not had any injections nor MRI.  Has not had any physical therapy.  She is amenable to trying.  2.  She complains of some ulcers and pain in her mouth.  Has not been to see the dentist in some time.  Painful with eating.  No fevers, chills or night sweats.  No recent illness that she knows of.  She was diagnosed with an ear infection when she went to the ER and she is still having some residual pain to her left ear.  No loss of hearing.  No drainage.   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, left 2017-12: Hypokalemia 2017-12: Constipation, chronic 2017-10: Memory loss due to medical condition 2017-10: Chronic fatigue 2017-08: Protein-calorie malnutrition, severe 2017-07: Mild protein-calorie malnutrition ([place]) 2017-07: Recurrent dehydration 2017-07: G tube feedings ([place]) 2017-07: Bacteremia 2017-07: Fever 2017-06: Chronic nausea  2017-06: Abdominal pain, chronic, epigastric 2017-06: Protein-calorie malnutrition, moderate ([place]) 2017-06: Epigastric pain 2017-06: Nausea 2017-05: Dehydration 2017-05: Prediabetes 2017-05: Morbid obesity ([place]) 2017-05: Mild obstructive sleep apnea 2017-05: Dyslipidemia 2017-05: S/P gastric bypass 2017-03: IIH (idiopathic intracranial hypertension) 2017-02: Worsening headaches 2017-02: Vision changes 2017-02: Perceived hearing changes 2016-02: Chronic daily headache 2015-02: Intractable migraine without aura and without status  migrainosus 2015-01: Liver lesion 2015-01: Splenic cyst 2015-01: Abdominal pain 2013-02: Vulvitis   Allergies   is allergic to sulfa antibiotics; metoclopramide; and zosyn [piperacillin sod-tazobactam so].  Medications    Current Outpatient Medications:  .  albuterol (PROVENTIL HFA;VENTOLIN HFA) 108 (90 Base) MCG/ACT inhaler, Inhale 2 puffs into the lungs every 6 (six) hours as needed for wheezing or shortness of breath., Disp: 1 Inhaler, Rfl: 0 .  amitriptyline (ELAVIL) 10 MG tablet, TAKE 2 TABLETS BY MOUTH AT BEDTIME., Disp: 60 tablet, Rfl: 2 .  BAYER MICROLET LANCETS lancets, Use as instructed, Disp: 100 each, Rfl: 12 .  Buprenorphine HCl 300 MCG FILM, Place 450 mcg inside cheek every 12 (twelve) hours. Can also take for breakthrough pain per patient , Disp: , Rfl:  .  busPIRone (BUSPAR) 5 MG tablet, Take 1 tablet (5 mg total) by mouth 3 (three) times daily., Disp: 90 tablet, Rfl: 0 .  CALCIUM-VITAMIN D PO, Take 1 tablet by mouth 2 (two) times daily. chewable, Disp: , Rfl:  .  Cyanocobalamin (VITAMIN B-12 SL), Place 1 drop under the tongue daily. 1 dropperful, Disp: , Rfl:  .  cyclobenzaprine (FLEXERIL) 5 MG tablet, TAKE 1 TABLET BY MOUTH 3 TIMES DAILY AS NEEDED FOR MUSCLE SPASMS. MAY  TAKE 2 TABS BY MOUTH AT BEDTIME, Disp: 40 tablet, Rfl: 2 .  docusate sodium (COLACE) 100 MG capsule, Take 100 mg by mouth daily as needed. , Disp: , Rfl:  .   eletriptan (RELPAX) 40 MG tablet, Take 1 tablet (40 mg total) by mouth every 2 (two) hours as needed for migraine or headache. Do not use >2 doses/24 hours, Disp: 10 tablet, Rfl: 5 .  famotidine (PEPCID) 20 MG tablet, Take 1 tablet (20 mg total) by mouth 2 (two) times daily., Disp: 60 tablet, Rfl: 2 .  glucose blood (CONTOUR NEXT TEST) test strip, 1 each by Other route 4 (four) times daily as needed (hypoglycemia). K91.2, Disp: 270 each, Rfl: 5 .  lactulose (CHRONULAC) 10 GM/15ML solution, Take 30 g by mouth daily as needed for moderate constipation. , Disp: , Rfl: 0 .  Multiple Vitamins-Minerals (BARIATRIC MULTIVITAMINS/IRON PO), Take 1 tablet by mouth 3 (three) times daily., Disp: , Rfl:  .  Omega-3 Fatty Acids (OMEGA-3 FISH OIL PO), Take by mouth daily., Disp: , Rfl:  .  ondansetron (ZOFRAN ODT) 4 MG disintegrating tablet, Take 1 tablet (4 mg total) by mouth every 8 (eight) hours as needed for nausea or vomiting., Disp: 20 tablet, Rfl: 0 .  OVER THE COUNTER MEDICATION, Meca root with xulane patch for hormone and premenopausal sx (wears all week)., Disp: , Rfl:  .  OVER THE COUNTER MEDICATION, Calcium, magnesium, zinc with D3 (one tablet daily), Disp: , Rfl:  .  OVER THE COUNTER MEDICATION, Collagen and vitamin c and biotin (takes one daily), Disp: , Rfl:  .  OVER THE COUNTER MEDICATION, Tunic for muscle and joint health, Disp: , Rfl:  .  polyethylene glycol (MIRALAX / GLYCOLAX) packet, Take 17 g by mouth daily as needed (for constipation.). , Disp: , Rfl:  .  PREMARIN vaginal cream, , Disp: , Rfl:  .  simethicone (MYLICON) 80 MG chewable tablet, Chew 80 mg by mouth every 6 (six) hours as needed for flatulence., Disp: , Rfl:  .  topiramate (TOPAMAX) 100 MG tablet, Take 1.5 tablets (150 mg total) by mouth daily., Disp: 135 tablet, Rfl: 1 .  triamterene-hydrochlorothiazide (MAXZIDE-25) 37.5-25 MG tablet, Take 1 tablet by mouth daily., Disp: 90 tablet, Rfl: 1 .  cefdinir (OMNICEF) 300 MG capsule, Take  1 capsule (300 mg total) by mouth 2 (two) times daily for 10 days., Disp: 20 capsule, Rfl: 0 .  chlorhexidine (PERIDEX) 0.12 % solution, Use as directed 15 mLs in the mouth or throat 2 (two) times daily., Disp: 120 mL, Rfl: 0 .  desvenlafaxine (PRISTIQ) 100 MG 24 hr tablet, TAKE 1 TABLET BY MOUTH ONCE A DAY, Disp: 90 tablet, Rfl: 0 .  hydrocortisone (ANUSOL-HC) 25 MG suppository, Place 1 suppository (25 mg total) rectally at bedtime. Use for 5 nights, Disp: 5 suppository, Rfl: 0 .  methocarbamol (ROBAXIN-750) 750 MG tablet, Take 1 tablet (750 mg total) by mouth every 6 (six) hours as needed for up to 30 doses for muscle spasms., Disp: 30 tablet, Rfl: 0 .  omeprazole (PRILOSEC) 20 MG capsule, Take 1 capsule (20 mg total) by mouth every morning., Disp: 30 capsule, Rfl: 1 .  predniSONE (DELTASONE) 20 MG tablet, 3 tabs x 3 days 2 tabs x 4 days 1 tab x 4 days 1/2 tab x 2 days, Disp: 22 tablet, Rfl: 0   Review of Systems    Constitutional: Negative for activity change, appetite change, chills and fever.  HENT: Negative for congestion, nosebleeds, trouble  swallowing and voice change.   Respiratory: Negative for cough, shortness of breath and wheezing.   Gastrointestinal: Negative for diarrhea, nausea and vomiting.  Genitourinary: Negative for difficulty urinating, dysuria, flank pain and hematuria.  Musculoskeletal: Positive  for back pain, joint swelling and neck pain.  Neurological: Positive for loss of strength, gait imbalance  See HPI. All other review of systems negative.     Physical Exam:   Physical Examination: General appearance - alert, well appearing, and in no distress and oriented to person, place, and time Mental status - normal mood, behavior, speech, dress, motor activity, and thought processes Eyes - EOM. PERRL.  Neck - supple, no significant adenopathy, carotids upstroke normal bilaterally, no bruits, thyroid exam: thyroid is normal in size without nodules or tenderness Chest -  clear to auscultation, no wheezes, rales or rhonchi, symmetric air entry  Heart - normal rate, regular rhythm, normal S1, S2, no murmurs, rubs, clicks or gallops Extremities - dependent LE edema without clubbing or cyanosis Skin - normal coloration and turgor, no rashes, no suspicious skin lesions noted  Neuro:  5/5 UE strength with the exception of wrist flexion/extension 4+/5 on the left.  Tandem gait abnormal.  Mental:  Mental Status: alert, oriented to person, place, and time, normal mood, behavior, speech, dress, motor activity, and thought processes.  No hyperpigmentation of skin.  No current hematomas noted   Lab Review   labs are reviewed, up to date and normal.   Assessment & Plan:  Margaret Fletcher is a 49 y.o. female . 1. Low back pain, unspecified back pain laterality, unspecified chronicity, unspecified whether sciatica present   2. Cervical disc disorder at C6-C7 level with radiculopathy   3. Spondylosis, cervical, with myelopathy   4. Trapezius muscle spasm   5. Right non-suppurative otitis media   6. Injury of jawline, subsequent encounter   7. Chronic pain syndrome   8. Recurrent dehydration   9. Disorder of oral soft tissue    Orders Placed This Encounter  Procedures  . Cervical Transforaminal Epidural  . MR Cervical Spine Wo Contrast  . Ambulatory referral to Physical Therapy   Meds ordered this encounter  Medications  . predniSONE (DELTASONE) 20 MG tablet    Sig: 3 tabs x 3 days 2 tabs x 4 days 1 tab x 4 days 1/2 tab x 2 days    Dispense:  22 tablet    Refill:  0  . fluconazole (DIFLUCAN) 150 MG tablet    Sig: Take 1 tablet (150 mg total) by mouth once for 1 dose.    Dispense:  1 tablet    Refill:  0  . methocarbamol (ROBAXIN-750) 750 MG tablet    Sig: Take 1 tablet (750 mg total) by mouth every 6 (six) hours as needed for up to 30 doses for muscle spasms.    Dispense:  30 tablet    Refill:  0  . cefdinir (OMNICEF) 300 MG capsule    Sig: Take 1  capsule (300 mg total) by mouth 2 (two) times daily for 10 days.    Dispense:  20 capsule    Refill:  0  . chlorhexidine (PERIDEX) 0.12 % solution    Sig: Use as directed 15 mLs in the mouth or throat 2 (two) times daily.    Dispense:  120 mL    Refill:  0   There is some evidence of infection still in her left ear with very erythematous canal and TM.  No drainage  or sign of rupture.  We will go ahead and treat for 10 days.  I sent in Diflucan as she often gets yeast infections with antibiotics.  We will use Peridex to treat for the aphthous ulcers.  We discussed drinking more water and improving her hydration if she is only drinking about 16 ounces a day.  In addition, I have sent her to physical therapy and ordered an MRI and subsequent transforaminal injection to the C6 nerve root on the left.  She will follow-up with Dr. Pamella Pert.  She is satisfied with this plan.  All questions were answered.  A total of 50 minutes were spent face-to-face with the patient during this encounter and over half of that time was spent on counseling and coordination of care.   Glyn Ade, NP

## 2019-03-24 ENCOUNTER — Other Ambulatory Visit: Payer: Self-pay

## 2019-03-24 DIAGNOSIS — B379 Candidiasis, unspecified: Secondary | ICD-10-CM

## 2019-03-24 DIAGNOSIS — T3695XA Adverse effect of unspecified systemic antibiotic, initial encounter: Secondary | ICD-10-CM

## 2019-03-24 MED ORDER — FLUCONAZOLE 150 MG PO TABS: 150.0000 mg | ORAL_TABLET | Freq: Once | ORAL | 0 refills | Status: AC

## 2019-03-24 MED FILL — FLUCONAZOLE 150 MG TABS: 150 | 1 days supply | Qty: 1 | Fill #0

## 2019-03-29 MED FILL — ELETRIPTAN HBR 40 MG TABLET: 40 | 5 days supply | Qty: 10 | Fill #1

## 2019-03-29 NOTE — Telephone Encounter (Signed)
We have not received notice on clinical review for eletriptan.  I called [facility] outpt pharmacy and was told that did go thru insurance and will get ready for pt.  I called [employer] at [phone] to f/u and was told still denied at 9 tab/30 days.  Since approved at pharmacy, will not proceed with appeal at this time unless hear back from pharmacy.

## 2019-04-02 ENCOUNTER — Encounter: Payer: Self-pay | Admitting: Family Medicine

## 2019-04-03 ENCOUNTER — Other Ambulatory Visit: Payer: Self-pay

## 2019-04-03 ENCOUNTER — Encounter: Payer: Self-pay | Admitting: Registered Nurse

## 2019-04-03 ENCOUNTER — Telehealth: Payer: Medicare HMO | Admitting: Registered Nurse

## 2019-04-03 ENCOUNTER — Telehealth: Payer: Self-pay

## 2019-04-03 ENCOUNTER — Ambulatory Visit (INDEPENDENT_AMBULATORY_CARE_PROVIDER_SITE_OTHER): Payer: Medicare HMO | Admitting: Registered Nurse

## 2019-04-03 VITALS — BP 109/78 | HR 79 | Temp 97.5°F | Resp 16 | Ht 68.0 in | Wt 200.2 lb

## 2019-04-03 DIAGNOSIS — N3000 Acute cystitis without hematuria: Secondary | ICD-10-CM | POA: Diagnosis not present

## 2019-04-03 DIAGNOSIS — N898 Other specified noninflammatory disorders of vagina: Secondary | ICD-10-CM | POA: Diagnosis not present

## 2019-04-03 DIAGNOSIS — H6090 Unspecified otitis externa, unspecified ear: Secondary | ICD-10-CM

## 2019-04-03 LAB — POCT URINALYSIS DIP (CLINITEK)
Bilirubin, UA: NEGATIVE
Blood, UA: NEGATIVE
Glucose, UA: NEGATIVE mg/dL
Ketones, POC UA: NEGATIVE mg/dL
Leukocytes, UA: NEGATIVE
Nitrite, UA: NEGATIVE
POC PROTEIN,UA: NEGATIVE
Spec Grav, UA: 1.015 (ref 1.010–1.025)
Urobilinogen, UA: 0.2 E.U./dL
pH, UA: 5 (ref 5.0–8.0)

## 2019-04-03 MED ORDER — FLUCONAZOLE 150 MG PO TABS: 150.0000 mg | ORAL_TABLET | Freq: Once | ORAL | 0 refills | Status: AC

## 2019-04-03 MED ORDER — NITROFURANTOIN MONOHYD MACRO 100 MG PO CAPS: 100.0000 mg | ORAL_CAPSULE | Freq: Two times a day (BID) | ORAL | 0 refills | Status: AC

## 2019-04-03 MED FILL — FLUCONAZOLE 150 MG TABS: 150 | 1 days supply | Qty: 1 | Fill #0

## 2019-04-03 MED FILL — NITROFURANTOIN MONO-MCR 100: 100 | 5 days supply | Qty: 10 | Fill #0

## 2019-04-03 NOTE — Patient Instructions (Signed)
° ° ° °  If you have lab work done today you will be contacted with your lab results within the next 2 weeks.  If you have not heard from us then please contact us. The fastest way to get your results is to register for My Chart. ° ° °IF you received an x-ray today, you will receive an invoice from [facility]. Please contact [facility] at [phone] with questions or concerns regarding your invoice.  ° °IF you received labwork today, you will receive an invoice from [facility]. Please contact [facility] at [phone] with questions or concerns regarding your invoice.  ° °Our billing staff will not be able to assist you with questions regarding bills from these companies. ° °You will be contacted with the lab results as soon as they are available. The fastest way to get your results is to activate your My Chart account. Instructions are located on the last page of this paperwork. If you have not heard from us regarding the results in 2 weeks, please contact this office. °  ° ° ° °

## 2019-04-03 NOTE — Progress Notes (Signed)
Established Patient Office Visit  Subjective:  Patient ID: Margaret Fletcher, female    DOB: [birth year]  Age: 49 y.o. MRN: [id number]  CC:  Chief Complaint  Patient presents with  . Otitis Media    left ear since christmas had two different antibiotics and its not getting better  . Urinary Tract Infection    frequent urination , lower abdomen and back pain as well.    HPI Margaret Fletcher presents for concern for UTI and ongoing ear infection.  Has been on omnicef recently for ear, levaquin last fall for UTI at that time.  Ear infection has been going on since christmas Overall worsening. Feels pressure and pain in ear, some tenderness in preauricular lymph node. Hearing sounds muffled, like water stuck in ear. No discharge or drainage. No redness or swelling on external ear. No recent URI  UTI onset this weekend. Frequency, urgency, suprapubic and flank pain. Has had frequent UTIs in the past - most recently treated with levaquin. Also had recent course of omnicef. Sulfa allergies. Culture sent.  Past Medical History:  Diagnosis Date  . Alopecia   . Anxiety   . Autoimmune disease ([place])    "Alopecia"  . Bacterial vaginosis   . Cyst of spleen   . Depression   . Disorder of oral soft tissue 03/17/2019  . Frequent UTI   . Hypertrophy of breast   . IIH (idiopathic intracranial hypertension) 2017   "related to headaches"  . Injury of jawline 03/17/2019  . Migraine headache    denies, ruled out - Propranolol.Using for headaches  . Non-suppurative otitis media 03/17/2019    Past Surgical History:  Procedure Laterality Date  . APPENDECTOMY    . BREAST EXCISIONAL BIOPSY Right 2017   removed at time of reduction  . BREAST REDUCTION SURGERY Bilateral 03/26/2015   Procedure: BILATERAL BREAST REDUCTION  ;  Surgeon: Youlanda Roys, MD;  Location: [facility];  Service: Plastics;  Laterality: Bilateral;  . BREATH TEK H PYLORI N/A 05/21/2014   Procedure: BREATH  TEK H PYLORI;  Surgeon: Greer Pickerel, MD;  Location: [facility];  Service: General;  Laterality: N/A;  . CELIAC PLEXUS BLOCK  07/18/2018   Per patient it was done at [place]    . CESAREAN SECTION    . CHOLECYSTECTOMY    . DILITATION & CURRETTAGE/HYSTROSCOPY WITH NOVASURE ABLATION N/A 07/19/2012   Procedure: HYSTEROSCOPY WITH NOVASURE ABLATION;  Surgeon: Farrel Gobble. Harrington Challenger, MD;  Location: [facility];  Service: Gynecology;  Laterality: N/A;  . ENDOMETRIAL ABLATION    . ESOPHAGOGASTRODUODENOSCOPY N/A 08/16/2015   Procedure: UPPER ESOPHAGOGASTRODUODENOSCOPY (EGD);  Surgeon: Greer Pickerel, MD;  Location: [facility];  Service: General;  Laterality: N/A;  . ESOPHAGOGASTRODUODENOSCOPY N/A 02/21/2016   Procedure: ESOPHAGOGASTRODUODENOSCOPY (EGD);  Surgeon: Greer Pickerel, MD;  Location: [facility];  Service: General;  Laterality: N/A;  . ESOPHAGOGASTRODUODENOSCOPY N/A 02/24/2017   Procedure: ESOPHAGOGASTRODUODENOSCOPY (EGD);  Surgeon: Greer Pickerel, MD;  Location: [facility];  Service: General;  Laterality: N/A;  . IR GENERIC HISTORICAL  10/04/2015   IR [place] Vivianne Master 10/04/2015 Markus Daft, MD [facility]  . IR GENERIC HISTORICAL  01/31/2016   IR [place] GASTRO/COLONIC TUBE PERCUT W/FLUORO 01/31/2016 Corrie Mckusick, DO [facility]  . IR GENERIC HISTORICAL  04/06/2016   IR [place] TUBE PERCUT W/FLUORO 04/06/2016 Arne Cleveland, MD [facility]  . IR GENERIC HISTORICAL  05/14/2016   IR GASTRIC TUBE PERC CHG W/O IMG  GUIDE 05/14/2016 Sandi Mariscal, MD [facility]  . LAPAROSCOPIC GASTROSTOMY N/A 09/30/2015   Procedure: LAPAROSCOPIC GASTROSTOMY TUBE PLACEMENT;  Surgeon: Greer Pickerel, MD;  Location: [facility];  Service: General;  Laterality: N/A;  . LAPAROSCOPIC ROUX-EN-Y GASTRIC BYPASS WITH HIATAL HERNIA REPAIR N/A 07/08/2015   Procedure: LAPAROSCOPIC ROUX-EN-Y GASTRIC BYPASS WITH HIATAL HERNIA REPAIR WITH UPPER ENDOSCOPY;  Surgeon: Greer Pickerel, MD;  Location: [facility];   Service: General;  Laterality: N/A;  . LAPAROSCOPIC SMALL BOWEL RESECTION N/A 09/30/2015   Procedure: LAPAROSCOPIC REVISION OF ROUX LIMB;  Surgeon: Greer Pickerel, MD;  Location: [facility];  Service: General;  Laterality: N/A;  . LAPAROSCOPY N/A 09/30/2015   Procedure: LAPAROSCOPY DIAGNOSTIC;  Surgeon: Greer Pickerel, MD;  Location: [facility];  Service: General;  Laterality: N/A;  . REDUCTION MAMMAPLASTY Bilateral 2017  . TUBAL LIGATION    . WISDOM TOOTH EXTRACTION      Family History  Problem Relation Age of Onset  . Cancer Mother        late 57'-50  . Breast cancer Mother        late 13'-50  . Stroke Father   . Diabetes Brother   . Seizures Son   . Breast cancer Maternal Aunt        late 40'-50  . Breast cancer Maternal Aunt        late 40'-50  . Breast cancer Maternal Aunt        late 40'-50  . Migraines Neg Hx     Social History   Socioeconomic History  . Marital status: Divorced    Spouse name: Not on file  . Number of children: 2  . Years of education: 44  . Highest education level: Not on file  Occupational History  . 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 Smoking status: Never Smoker  . Smokeless tobacco: Never Used  Substance and Sexual Activity  . Alcohol use: Not Currently  . Drug use: No  . Sexual activity: Yes    Birth control/protection: Surgical  Other Topics Concern  . Not on file  Social History Narrative   Lives with partner   Caffeine use: minimal coffee   Right-handed   Social Determinants of Health   Financial Resource Strain:   . Difficulty of Paying Living Expenses: Not on file  Food Insecurity:   . Worried About Charity fundraiser in the Last Year: Not on file  . Ran Out of Food in the Last Year: Not on file  Transportation Needs:   . Lack of Transportation (Medical): Not on file  . Lack of Transportation (Non-Medical): Not on file  Physical Activity:   . Days of Exercise per Week: Not on file  . Minutes of Exercise per  Session: Not on file  Stress:   . Feeling of Stress : Not on file  Social Connections:   . Frequency of Communication with Friends and Family: Not on file  . Frequency of Social Gatherings with Friends and Family: Not on file  . Attends Religious Services: Not on file  . Active Member of Clubs or Organizations: Not on file  . Attends Archivist Meetings: Not on file  . Marital Status: Not on file  Intimate Partner Violence:   . Fear of Current or Ex-Partner: Not on file  . Emotionally Abused: Not on file  . Physically Abused: Not on file  . Sexually Abused: Not on file    Outpatient Medications Prior to Visit  Medication Sig Dispense Refill  . albuterol (PROVENTIL HFA;VENTOLIN HFA) 108 (90 Base) MCG/ACT inhaler Inhale 2 puffs into the lungs every 6 (six) hours as needed for wheezing or shortness of breath. 1 Inhaler 0  . amitriptyline (ELAVIL) 10 MG tablet TAKE 2 TABLETS BY MOUTH AT BEDTIME. 60 tablet 2  . BAYER MICROLET LANCETS lancets Use as instructed 100 each 12  . Buprenorphine HCl 300 MCG FILM Place 450 mcg inside cheek every 12 (twelve) hours. Can also take for breakthrough pain per patient     . busPIRone (BUSPAR) 5 MG tablet Take 1 tablet (5 mg total) by mouth 3 (three) times daily. 90 tablet 0  . CALCIUM-VITAMIN D PO Take 1 tablet by mouth 2 (two) times daily. chewable    . chlorhexidine (PERIDEX) 0.12 % solution Use as directed 15 mLs in the mouth or throat 2 (two) times daily. 120 mL 0  . Cyanocobalamin (VITAMIN B-12 SL) Place 1 drop under the tongue daily. 1 dropperful    . cyclobenzaprine (FLEXERIL) 5 MG tablet TAKE 1 TABLET BY MOUTH 3 TIMES DAILY AS NEEDED FOR MUSCLE SPASMS. MAY TAKE 2 TABS BY MOUTH AT BEDTIME 40 tablet 2  . desvenlafaxine (PRISTIQ) 100 MG 24 hr tablet TAKE 1 TABLET BY MOUTH ONCE A DAY 90 tablet 0  . docusate sodium (COLACE) 100 MG capsule Take 100 mg by mouth daily as needed.     . eletriptan (RELPAX) 40 MG tablet Take 1 tablet (40 mg total) by  mouth every 2 (two) hours as needed for migraine or headache. Do not use >2 doses/24 hours 10 tablet 5  . famotidine (PEPCID) 20 MG tablet Take 1 tablet (20 mg total) by mouth 2 (two) times daily. 60 tablet 2  . glucose blood (CONTOUR NEXT TEST) test strip 1 each by Other route 4 (four) times daily as needed (hypoglycemia). K91.2 270 each 5  . hydrocortisone (ANUSOL-HC) 25 MG suppository Place 1 suppository (25 mg total) rectally at bedtime. Use for 5 nights 5 suppository 0  . lactulose (CHRONULAC) 10 GM/15ML solution Take 30 g by mouth daily as needed for moderate constipation.   0  . methocarbamol (ROBAXIN-750) 750 MG tablet Take 1 tablet (750 mg total) by mouth every 6 (six) hours as needed for up to 30 doses for muscle spasms. 30 tablet 0  . Multiple Vitamins-Minerals (BARIATRIC MULTIVITAMINS/IRON PO) Take 1 tablet by mouth 3 (three) times daily.    . Omega-3 Fatty Acids (OMEGA-3 FISH OIL PO) Take by mouth daily.    Marland Kitchen omeprazole (PRILOSEC) 20 MG capsule Take 1 capsule (20 mg total) by mouth every morning. 30 capsule 1  . ondansetron (ZOFRAN ODT) 4 MG disintegrating tablet Take 1 tablet (4 mg total) by mouth every 8 (eight) hours as needed for nausea or vomiting. 20 tablet 0  . OVER THE COUNTER MEDICATION Meca root with xulane patch for hormone and premenopausal sx (wears all week).    Marland Kitchen OVER THE COUNTER MEDICATION Calcium, magnesium, zinc with D3 (one tablet daily)    . OVER THE COUNTER MEDICATION Collagen and vitamin c and biotin (takes one daily)    . OVER THE COUNTER MEDICATION Tunic for muscle and joint health    . polyethylene glycol (MIRALAX / GLYCOLAX) packet Take 17 g by mouth daily as needed (for constipation.).     Marland Kitchen PREMARIN vaginal cream     . simethicone (MYLICON) 80 MG chewable tablet Chew 80 mg by mouth every 6 (six) hours as needed for  flatulence.    . topiramate (TOPAMAX) 100 MG tablet Take 1.5 tablets (150 mg total) by mouth daily. 135 tablet 1  .  triamterene-hydrochlorothiazide (MAXZIDE-25) 37.5-25 MG tablet Take 1 tablet by mouth daily. 90 tablet 1  . predniSONE (DELTASONE) 20 MG tablet 3 tabs x 3 days 2 tabs x 4 days 1 tab x 4 days 1/2 tab x 2 days (Patient not taking: Reported on 04/03/2019) 22 tablet 0   No facility-administered medications prior to visit.    Allergies  Allergen Reactions  . Sulfa Antibiotics Anaphylaxis  . Metoclopramide Other (See Comments)    Elevated prolactin >5x ULN induction lactation when taking scheduled for several months  . Zosyn [Piperacillin Sod-Tazobactam So] Rash    Has patient had a PCN reaction causing immediate rash, facial/tongue/throat swelling, SOB or lightheadedness with hypotension: No Has patient had a PCN reaction causing severe rash involving mucus membranes or skin necrosis: No Has patient had a PCN reaction that required hospitalization: Unknown--inpatient when reaction occurred Has patient had a PCN reaction occurring within the last 10 years: Yes If all of the above answers are "NO", then may proceed with Cephalosporin use.     ROS Review of Systems    Objective:    Physical Exam  Constitutional: She is oriented to person, place, and time. She appears well-developed and well-nourished.  HENT:  Right Ear: There is tenderness. No drainage or swelling. Tympanic membrane is bulging. No middle ear effusion. Decreased hearing is noted.  Left Ear: Hearing, tympanic membrane, external ear and ear canal normal. No drainage, swelling or tenderness. Tympanic membrane is not bulging.  No middle ear effusion. No decreased hearing is noted.  Cardiovascular: Normal rate and regular rhythm.  Pulmonary/Chest: Effort normal. No respiratory distress.  Neurological: She is alert and oriented to person, place, and time.  Skin: Skin is warm and dry. No rash noted. No erythema. No pallor.  Psychiatric: She has a normal mood and affect. Her behavior is normal. Judgment and thought content normal.    Nursing note and vitals reviewed.   BP 109/78   Pulse 79   Temp (!) 97.5 F (36.4 C) (Temporal)   Resp 16   Ht 5\' 8"  (1.727 m)   Wt 200 lb 3.2 oz (90.8 kg)   SpO2 95%   BMI 30.44 kg/m  Wt Readings from Last 3 Encounters:  04/03/19 200 lb 3.2 oz (90.8 kg)  03/23/19 196 lb (88.9 kg)  03/17/19 183 lb 6.4 oz (83.2 kg)     There are no preventive care reminders to display for this patient.  There are no preventive care reminders to display for this patient.  Lab Results  Component Value Date   TSH 1.310 02/28/2019   Lab Results  Component Value Date   WBC 13.2 (H) 03/04/2019   HGB 13.4 03/04/2019   HCT 41.3 03/04/2019   MCV 97.9 03/04/2019   PLT 282 03/04/2019   Lab Results  Component Value Date   NA 139 02/28/2019   K 4.0 02/28/2019   CO2 22 02/28/2019   GLUCOSE 81 02/28/2019   BUN 25 (H) 02/28/2019   CREATININE 1.35 (H) 02/28/2019   BILITOT <0.2 02/28/2019   ALKPHOS 86 02/28/2019   AST 27 02/28/2019   ALT 19 02/28/2019   PROT 6.4 02/28/2019   ALBUMIN 3.8 02/28/2019   CALCIUM 8.9 02/28/2019   ANIONGAP 7 03/22/2018   Lab Results  Component Value Date   CHOL 207 (H) 02/28/2019   Lab Results  Component Value Date   HDL 60 02/28/2019   Lab Results  Component Value Date   LDLCALC 131 (H) 02/28/2019   Lab Results  Component Value Date   TRIG 92 02/28/2019   Lab Results  Component Value Date   CHOLHDL 3.5 02/28/2019   Lab Results  Component Value Date   HGBA1C 5.3 03/16/2016      Assessment & Plan:   Problem List Items Addressed This Visit    None    Visit Diagnoses    Acute cystitis without hematuria    -  Primary   Relevant Medications   nitrofurantoin, macrocrystal-monohydrate, (MACROBID) 100 MG capsule   Other Relevant Orders   POCT URINALYSIS DIP (CLINITEK) (Completed)   Urine Culture   Recurrent otitis externa, unspecified laterality       Relevant Orders   Ambulatory referral to ENT      Meds ordered this encounter   Medications  . nitrofurantoin, macrocrystal-monohydrate, (MACROBID) 100 MG capsule    Sig: Take 1 capsule (100 mg total) by mouth 2 (two) times daily for 5 days.    Dispense:  10 capsule    Refill:  0    Order Specific Question:   Supervising Provider    Answer:   Forrest Moron [id number]    Follow-up: No follow-ups on file.   PLAN  Refer to ENT for ongoing otic concerns  Will treat UTI with macrobid, culture sent, will follow up as warranted  Given return to clinic precautions. She will follow up with PCP Dr. Pamella Pert in March as scheduled  Patient encouraged to call clinic with any questions, comments, or concerns.  Maximiano Coss, NP

## 2019-04-04 DIAGNOSIS — M5412 Radiculopathy, cervical region: Secondary | ICD-10-CM | POA: Diagnosis not present

## 2019-04-04 DIAGNOSIS — Z79899 Other long term (current) drug therapy: Secondary | ICD-10-CM | POA: Diagnosis not present

## 2019-04-04 DIAGNOSIS — R1013 Epigastric pain: Secondary | ICD-10-CM | POA: Diagnosis not present

## 2019-04-04 DIAGNOSIS — M7918 Myalgia, other site: Secondary | ICD-10-CM | POA: Diagnosis not present

## 2019-04-04 DIAGNOSIS — G8929 Other chronic pain: Secondary | ICD-10-CM | POA: Diagnosis not present

## 2019-04-04 DIAGNOSIS — Z79891 Long term (current) use of opiate analgesic: Secondary | ICD-10-CM | POA: Diagnosis not present

## 2019-04-05 ENCOUNTER — Encounter: Payer: Self-pay | Admitting: Registered Nurse

## 2019-04-05 LAB — URINE CULTURE: Organism ID, Bacteria: NO GROWTH

## 2019-04-10 DIAGNOSIS — Z9181 History of falling: Secondary | ICD-10-CM | POA: Insufficient documentation

## 2019-04-10 DIAGNOSIS — L299 Pruritus, unspecified: Secondary | ICD-10-CM | POA: Diagnosis not present

## 2019-04-10 DIAGNOSIS — M26629 Arthralgia of temporomandibular joint, unspecified side: Secondary | ICD-10-CM | POA: Insufficient documentation

## 2019-04-10 MED FILL — MOMETASONE FUROATE 0.1% SOL: 0.1 | 20 days supply | Qty: 30 | Fill #0

## 2019-04-17 ENCOUNTER — Ambulatory Visit (INDEPENDENT_AMBULATORY_CARE_PROVIDER_SITE_OTHER): Payer: Medicare HMO

## 2019-04-17 ENCOUNTER — Other Ambulatory Visit: Payer: Self-pay | Admitting: Internal Medicine

## 2019-04-17 DIAGNOSIS — Z1159 Encounter for screening for other viral diseases: Secondary | ICD-10-CM | POA: Diagnosis not present

## 2019-04-17 MED FILL — BELBUCA 600 MCG FILM: 600 | 30 days supply | Qty: 60 | Fill #0

## 2019-04-17 MED FILL — CYCLOBENZAPRINE HCL 5 MG TA: 5 | 30 days supply | Qty: 30 | Fill #0

## 2019-04-17 MED FILL — TOPIRAMATE 100 MG TABLET: 100 | 90 days supply | Qty: 135 | Fill #1

## 2019-04-18 ENCOUNTER — Encounter: Payer: Self-pay | Admitting: Registered Nurse

## 2019-04-18 ENCOUNTER — Other Ambulatory Visit: Payer: Self-pay

## 2019-04-18 ENCOUNTER — Ambulatory Visit (INDEPENDENT_AMBULATORY_CARE_PROVIDER_SITE_OTHER): Payer: Medicare HMO | Admitting: Registered Nurse

## 2019-04-18 VITALS — BP 138/86 | HR 89 | Temp 98.1°F | Ht 68.0 in | Wt 199.4 lb

## 2019-04-18 DIAGNOSIS — R0602 Shortness of breath: Secondary | ICD-10-CM

## 2019-04-18 DIAGNOSIS — R6884 Jaw pain: Secondary | ICD-10-CM | POA: Diagnosis not present

## 2019-04-18 DIAGNOSIS — H938X2 Other specified disorders of left ear: Secondary | ICD-10-CM | POA: Diagnosis not present

## 2019-04-18 DIAGNOSIS — R609 Edema, unspecified: Secondary | ICD-10-CM

## 2019-04-18 LAB — SARS CORONAVIRUS 2 (TAT 6-24 HRS): SARS Coronavirus 2: NEGATIVE

## 2019-04-18 MED ORDER — CETIRIZINE HCL 10 MG PO TABS: 10.0000 mg | ORAL_TABLET | Freq: Every day | ORAL | 11 refills | Status: DC

## 2019-04-18 MED ORDER — AZELASTINE HCL 0.1 % NA SOLN: 1.0000 | Freq: Two times a day (BID) | NASAL | 0 refills | Status: AC

## 2019-04-18 MED ORDER — FLUTICASONE PROPIONATE 50 MCG/ACT NA SUSP: 2.0000 | Freq: Every day | NASAL | 6 refills | Status: DC

## 2019-04-18 MED FILL — AZELASTINE HCL 137 MCG SPRY: 0.1 | 50 days supply | Qty: 30 | Fill #0

## 2019-04-18 MED FILL — FLUTICASONE PROP 50 MCG SPR: 50 | 30 days supply | Qty: 16 | Fill #0

## 2019-04-18 NOTE — Patient Instructions (Signed)
° ° ° °  If you have lab work done today you will be contacted with your lab results within the next 2 weeks.  If you have not heard from us then please contact us. The fastest way to get your results is to register for My Chart. ° ° °IF you received an x-ray today, you will receive an invoice from [facility]. Please contact [facility] at [phone] with questions or concerns regarding your invoice.  ° °IF you received labwork today, you will receive an invoice from [facility]. Please contact [facility] at [phone] with questions or concerns regarding your invoice.  ° °Our billing staff will not be able to assist you with questions regarding bills from these companies. ° °You will be contacted with the lab results as soon as they are available. The fastest way to get your results is to activate your My Chart account. Instructions are located on the last page of this paperwork. If you have not heard from us regarding the results in 2 weeks, please contact this office. °  ° ° ° °

## 2019-04-18 NOTE — Progress Notes (Signed)
Acute Office Visit  Subjective:    Patient ID: Margaret Fletcher, female    DOB: 05-11-1970, 49 y.o.   MRN: [id number]  Chief Complaint  Patient presents with  . Follow-up    ear pain.  . Foot Swelling    Pt stated that she noticed that both of her foot were swollen this moningf when she got up.    HPI Patient is in today for follow up on ear pain.  Notes that she has been seen by myself and Jens Som for this - however, no relief. Was sent to ENT who suggested that infection had cleared and that her continuing symptoms may be related to a fall she experienced in late 2020.  Here today with ongoing ear discomfort, pain, pressure. Notes it is in canal and feels pressure inside. Notes that she's had some intermittent congestion over the preceding weeks.   Also notes that she has foot pain and swelling - concerned that she had stepped on glass, point sharp pain in bottom of L foot. Unable to indicate precise moment this started. No bleeding, redness, warmth in feet.   Lastly, some new shob this morning. Improving. No coughing. No fatigue. Denies further CV symptoms at this time   Past Medical History:  Diagnosis Date  . Alopecia   . Anxiety   . Autoimmune disease ([place])    "Alopecia"  . Bacterial vaginosis   . Cyst of spleen   . Depression   . Disorder of oral soft tissue 03/17/2019  . Frequent UTI   . Hypertrophy of breast   . IIH (idiopathic intracranial hypertension) 2017   "related to headaches"  . Injury of jawline 03/17/2019  . Migraine headache    denies, ruled out - Propranolol.Using for headaches  . Non-suppurative otitis media 03/17/2019    Past Surgical History:  Procedure Laterality Date  . APPENDECTOMY    . BREAST EXCISIONAL BIOPSY Right 2017   removed at time of reduction  . BREAST REDUCTION SURGERY Bilateral 03/26/2015   Procedure: BILATERAL BREAST REDUCTION  ;  Surgeon: Youlanda Roys, MD;  Location: [facility];  Service: Plastics;   Laterality: Bilateral;  . BREATH TEK H PYLORI N/A 05/21/2014   Procedure: BREATH TEK H PYLORI;  Surgeon: Greer Pickerel, MD;  Location: [facility];  Service: General;  Laterality: N/A;  . CELIAC PLEXUS BLOCK  07/18/2018   Per patient it was done at [facility]    . CESAREAN SECTION    . CHOLECYSTECTOMY    . DILITATION & CURRETTAGE/HYSTROSCOPY WITH NOVASURE ABLATION N/A 07/19/2012   Procedure: HYSTEROSCOPY WITH NOVASURE ABLATION;  Surgeon: Farrel Gobble. Harrington Challenger, MD;  Location: [facility];  Service: Gynecology;  Laterality: N/A;  . ENDOMETRIAL ABLATION    . ESOPHAGOGASTRODUODENOSCOPY N/A 08/16/2015   Procedure: UPPER ESOPHAGOGASTRODUODENOSCOPY (EGD);  Surgeon: Greer Pickerel, MD;  Location: [facility];  Service: General;  Laterality: N/A;  . ESOPHAGOGASTRODUODENOSCOPY N/A 02/21/2016   Procedure: ESOPHAGOGASTRODUODENOSCOPY (EGD);  Surgeon: Greer Pickerel, MD;  Location: [facility];  Service: General;  Laterality: N/A;  . ESOPHAGOGASTRODUODENOSCOPY N/A 02/24/2017   Procedure: ESOPHAGOGASTRODUODENOSCOPY (EGD);  Surgeon: Greer Pickerel, MD;  Location: [facility];  Service: General;  Laterality: N/A;  . IR GENERIC HISTORICAL  10/04/2015   IR [place] Vivianne Master 10/04/2015 Markus Daft, MD [facility]  . IR GENERIC HISTORICAL  01/31/2016   IR [place] GASTRO/COLONIC TUBE PERCUT W/FLUORO 01/31/2016 Corrie Mckusick, DO [facility]  . IR GENERIC HISTORICAL  04/06/2016   IR [place] GASTRO/COLONIC TUBE PERCUT W/FLUORO 04/06/2016 Arne Cleveland, MD [facility]  . IR GENERIC HISTORICAL  05/14/2016   IR GASTRIC TUBE PERC CHG W/O IMG GUIDE 05/14/2016 Sandi Mariscal, MD [facility]  . LAPAROSCOPIC GASTROSTOMY N/A 09/30/2015   Procedure: LAPAROSCOPIC GASTROSTOMY TUBE PLACEMENT;  Surgeon: Greer Pickerel, MD;  Location: [facility];  Service: General;  Laterality: N/A;  . LAPAROSCOPIC ROUX-EN-Y GASTRIC BYPASS WITH HIATAL HERNIA REPAIR N/A 07/08/2015   Procedure: LAPAROSCOPIC ROUX-EN-Y GASTRIC BYPASS WITH HIATAL  HERNIA REPAIR WITH UPPER ENDOSCOPY;  Surgeon: Greer Pickerel, MD;  Location: [facility];  Service: General;  Laterality: N/A;  . LAPAROSCOPIC SMALL BOWEL RESECTION N/A 09/30/2015   Procedure: LAPAROSCOPIC REVISION OF ROUX LIMB;  Surgeon: Greer Pickerel, MD;  Location: [facility];  Service: General;  Laterality: N/A;  . LAPAROSCOPY N/A 09/30/2015   Procedure: LAPAROSCOPY DIAGNOSTIC;  Surgeon: Greer Pickerel, MD;  Location: [facility];  Service: General;  Laterality: N/A;  . REDUCTION MAMMAPLASTY Bilateral 2017  . TUBAL LIGATION    . WISDOM TOOTH EXTRACTION      Family History  Problem Relation Age of Onset  . Cancer Mother        late 62'-50  . Breast cancer Mother        late 37'-50  . Stroke Father   . Diabetes Brother   . Seizures Son   . Breast cancer Maternal Aunt        late 40'-50  . Breast cancer Maternal Aunt        late 40'-50  . Breast cancer Maternal Aunt        late 40'-50  . Migraines Neg Hx     Social History   Socioeconomic History  . Marital status: Divorced    Spouse name: Not on file  . Number of children: 2  . Years of education: 86  . Highest education level: Not on file  Occupational History  . 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 Smoking status: Never Smoker  . Smokeless tobacco: Never Used  Substance and Sexual Activity  . Alcohol use: Not Currently  . Drug use: No  . Sexual activity: Yes    Birth control/protection: Surgical  Other Topics Concern  . Not on file  Social History Narrative   Lives with partner   Caffeine use: minimal coffee   Right-handed   Social Determinants of Health   Financial Resource Strain:   . Difficulty of Paying Living Expenses: Not on file  Food Insecurity:   . Worried About Charity fundraiser in the Last Year: Not on file  . Ran Out of Food in the Last Year: Not on file  Transportation Needs:   . Lack of Transportation (Medical): Not on file  . Lack of Transportation (Non-Medical): Not on file  Physical  Activity:   . Days of Exercise per Week: Not on file  . Minutes of Exercise per Session: Not on file  Stress:   . Feeling of Stress : Not on file  Social Connections:   . Frequency of Communication with Friends and Family: Not on file  . Frequency of Social Gatherings with Friends and Family: Not on file  . Attends Religious Services: Not on file  . Active Member of Clubs or Organizations: Not on file  . Attends Archivist Meetings: Not on file  . Marital Status: Not on file  Intimate Partner Violence:   . Fear of Current or Ex-Partner: Not on  file  . Emotionally Abused: Not on file  . Physically Abused: Not on file  . Sexually Abused: Not on file    Outpatient Medications Prior to Visit  Medication Sig Dispense Refill  . albuterol (PROVENTIL HFA;VENTOLIN HFA) 108 (90 Base) MCG/ACT inhaler Inhale 2 puffs into the lungs every 6 (six) hours as needed for wheezing or shortness of breath. 1 Inhaler 0  . amitriptyline (ELAVIL) 10 MG tablet TAKE 2 TABLETS BY MOUTH AT BEDTIME. 60 tablet 2  . BAYER MICROLET LANCETS lancets Use as instructed 100 each 12  . Buprenorphine HCl 300 MCG FILM Place 450 mcg inside cheek every 12 (twelve) hours. Can also take for breakthrough pain per patient     . busPIRone (BUSPAR) 5 MG tablet Take 1 tablet (5 mg total) by mouth 3 (three) times daily. 90 tablet 0  . CALCIUM-VITAMIN D PO Take 1 tablet by mouth 2 (two) times daily. chewable    . chlorhexidine (PERIDEX) 0.12 % solution Use as directed 15 mLs in the mouth or throat 2 (two) times daily. 120 mL 0  . Cyanocobalamin (VITAMIN B-12 SL) Place 1 drop under the tongue daily. 1 dropperful    . cyclobenzaprine (FLEXERIL) 5 MG tablet TAKE 1 TABLET BY MOUTH 3 TIMES DAILY AS NEEDED FOR MUSCLE SPASMS. MAY TAKE 2 TABS BY MOUTH AT BEDTIME 40 tablet 2  . desvenlafaxine (PRISTIQ) 100 MG 24 hr tablet TAKE 1 TABLET BY MOUTH ONCE A DAY 90 tablet 0  . docusate sodium (COLACE) 100 MG capsule Take 100 mg by mouth daily  as needed.     . eletriptan (RELPAX) 40 MG tablet Take 1 tablet (40 mg total) by mouth every 2 (two) hours as needed for migraine or headache. Do not use >2 doses/24 hours 10 tablet 5  . famotidine (PEPCID) 20 MG tablet Take 1 tablet (20 mg total) by mouth 2 (two) times daily. 60 tablet 2  . glucose blood (CONTOUR NEXT TEST) test strip 1 each by Other route 4 (four) times daily as needed (hypoglycemia). K91.2 270 each 5  . hydrocortisone (ANUSOL-HC) 25 MG suppository Place 1 suppository (25 mg total) rectally at bedtime. Use for 5 nights 5 suppository 0  . lactulose (CHRONULAC) 10 GM/15ML solution Take 30 g by mouth daily as needed for moderate constipation.   0  . methocarbamol (ROBAXIN-750) 750 MG tablet Take 1 tablet (750 mg total) by mouth every 6 (six) hours as needed for up to 30 doses for muscle spasms. 30 tablet 0  . Multiple Vitamins-Minerals (BARIATRIC MULTIVITAMINS/IRON PO) Take 1 tablet by mouth 3 (three) times daily.    . Omega-3 Fatty Acids (OMEGA-3 FISH OIL PO) Take by mouth daily.    Marland Kitchen omeprazole (PRILOSEC) 20 MG capsule Take 1 capsule (20 mg total) by mouth every morning. 30 capsule 1  . ondansetron (ZOFRAN ODT) 4 MG disintegrating tablet Take 1 tablet (4 mg total) by mouth every 8 (eight) hours as needed for nausea or vomiting. 20 tablet 0  . OVER THE COUNTER MEDICATION Meca root with xulane patch for hormone and premenopausal sx (wears all week).    Marland Kitchen OVER THE COUNTER MEDICATION Calcium, magnesium, zinc with D3 (one tablet daily)    . OVER THE COUNTER MEDICATION Collagen and vitamin c and biotin (takes one daily)    . OVER THE COUNTER MEDICATION Tunic for muscle and joint health    . polyethylene glycol (MIRALAX / GLYCOLAX) packet Take 17 g by mouth daily as needed (for constipation.).     Marland Kitchen  PREMARIN vaginal cream     . simethicone (MYLICON) 80 MG chewable tablet Chew 80 mg by mouth every 6 (six) hours as needed for flatulence.    . topiramate (TOPAMAX) 100 MG tablet Take 1.5  tablets (150 mg total) by mouth daily. 135 tablet 1  . triamterene-hydrochlorothiazide (MAXZIDE-25) 37.5-25 MG tablet Take 1 tablet by mouth daily. 90 tablet 1   No facility-administered medications prior to visit.    Allergies  Allergen Reactions  . Sulfa Antibiotics Anaphylaxis  . Metoclopramide Other (See Comments)    Elevated prolactin >5x ULN induction lactation when taking scheduled for several months  . Zosyn [Piperacillin Sod-Tazobactam So] Rash    Has patient had a PCN reaction causing immediate rash, facial/tongue/throat swelling, SOB or lightheadedness with hypotension: No Has patient had a PCN reaction causing severe rash involving mucus membranes or skin necrosis: No Has patient had a PCN reaction that required hospitalization: Unknown--inpatient when reaction occurred Has patient had a PCN reaction occurring within the last 10 years: Yes If all of the above answers are "NO", then may proceed with Cephalosporin use.     Review of Systems  Constitutional: Negative.   HENT: Positive for congestion, ear pain and sinus pressure. Negative for dental problem, drooling, ear discharge, facial swelling, hearing loss, mouth sores, nosebleeds, postnasal drip, rhinorrhea, sinus pain, sneezing, sore throat, tinnitus, trouble swallowing and voice change.   Eyes: Negative.   Respiratory: Positive for shortness of breath. Negative for apnea, cough, choking, chest tightness, wheezing and stridor.   Cardiovascular: Positive for leg swelling. Negative for chest pain and palpitations.  Gastrointestinal: Negative.   Endocrine: Negative.   Genitourinary: Negative.   Musculoskeletal: Negative.   Skin: Positive for wound (suspected glass in foot). Negative for color change, pallor and rash.  Allergic/Immunologic: Negative.   Neurological: Negative.   Hematological: Negative.   Psychiatric/Behavioral: Negative.   All other systems reviewed and are negative.      Objective:    Physical  Exam Vitals and nursing note reviewed.  Constitutional:      General: She is not in acute distress.    Appearance: Normal appearance. She is normal weight. She is not ill-appearing, toxic-appearing or diaphoretic.  HENT:     Head: Normocephalic and atraumatic.     Right Ear: Tympanic membrane, ear canal and external ear normal. No tenderness. There is no impacted cerumen.     Left Ear: Tympanic membrane and external ear normal. Tenderness present. There is no impacted cerumen.     Ears:     Weber exam findings: does not lateralize.     Mouth/Throat:     Mouth: Mucous membranes are moist.     Pharynx: Oropharynx is clear. No oropharyngeal exudate or posterior oropharyngeal erythema.  Eyes:     Conjunctiva/sclera: Conjunctivae normal.     Pupils: Pupils are equal, round, and reactive to light.  Cardiovascular:     Rate and Rhythm: Normal rate and regular rhythm.     Pulses: Normal pulses.     Heart sounds: Normal heart sounds. No murmur. No friction rub. No gallop.   Pulmonary:     Effort: Pulmonary effort is normal. No respiratory distress.     Breath sounds: Normal breath sounds. No stridor. No wheezing, rhonchi or rales.  Chest:     Chest wall: No tenderness.  Musculoskeletal:     Cervical back: Normal range of motion.       Feet:  Skin:    Capillary Refill: Capillary refill takes  less than 2 seconds.  Neurological:     General: No focal deficit present.     Mental Status: She is alert and oriented to person, place, and time. Mental status is at baseline.     Cranial Nerves: No cranial nerve deficit.  Psychiatric:        Mood and Affect: Mood normal.        Behavior: Behavior normal.        Thought Content: Thought content normal.        Judgment: Judgment normal.     BP 138/86 (BP Location: Left Arm, Patient Position: Sitting, Cuff Size: Large)   Pulse 89   Temp 98.1 F (36.7 C) (Temporal)   Ht 5\' 8"  (1.727 m)   Wt 199 lb 6.4 oz (90.4 kg)   SpO2 100%   BMI 30.32  kg/m  Wt Readings from Last 3 Encounters:  04/18/19 199 lb 6.4 oz (90.4 kg)  04/03/19 200 lb 3.2 oz (90.8 kg)  03/23/19 196 lb (88.9 kg)    There are no preventive care reminders to display for this patient.  There are no preventive care reminders to display for this patient.   Lab Results  Component Value Date   TSH 1.310 02/28/2019   Lab Results  Component Value Date   WBC 13.2 (H) 03/04/2019   HGB 13.4 03/04/2019   HCT 41.3 03/04/2019   MCV 97.9 03/04/2019   PLT 282 03/04/2019   Lab Results  Component Value Date   NA 139 02/28/2019   K 4.0 02/28/2019   CO2 22 02/28/2019   GLUCOSE 81 02/28/2019   BUN 25 (H) 02/28/2019   CREATININE 1.35 (H) 02/28/2019   BILITOT <0.2 02/28/2019   ALKPHOS 86 02/28/2019   AST 27 02/28/2019   ALT 19 02/28/2019   PROT 6.4 02/28/2019   ALBUMIN 3.8 02/28/2019   CALCIUM 8.9 02/28/2019   ANIONGAP 7 03/22/2018   Lab Results  Component Value Date   CHOL 207 (H) 02/28/2019   Lab Results  Component Value Date   HDL 60 02/28/2019   Lab Results  Component Value Date   LDLCALC 131 (H) 02/28/2019   Lab Results  Component Value Date   TRIG 92 02/28/2019   Lab Results  Component Value Date   CHOLHDL 3.5 02/28/2019   Lab Results  Component Value Date   HGBA1C 5.3 03/16/2016       Assessment & Plan:   Problem List Items Addressed This Visit    None    Visit Diagnoses    Shortness of breath    -  Primary   Relevant Orders   EKG 12-Lead (Completed)   Dependent edema       Jaw pain       Relevant Orders   Ambulatory referral to Physical Therapy   Pressure sensation in left ear       Relevant Medications   azelastine (ASTELIN) 0.1 % nasal spray   fluticasone (FLONASE) 50 MCG/ACT nasal spray   cetirizine (ZYRTEC) 10 MG tablet       Meds ordered this encounter  Medications  . azelastine (ASTELIN) 0.1 % nasal spray    Sig: Place 1 spray into both nostrils 2 (two) times daily. Use in each nostril as directed     Dispense:  30 mL    Refill:  0    Order Specific Question:   Supervising Provider    Answer:   Delia Chimes A [id number]  . fluticasone (FLONASE) 50 MCG/ACT  nasal spray    Sig: Place 2 sprays into both nostrils daily.    Dispense:  16 g    Refill:  6    Order Specific Question:   Supervising Provider    Answer:   Delia Chimes A [id number]  . cetirizine (ZYRTEC) 10 MG tablet    Sig: Take 1 tablet (10 mg total) by mouth daily.    Dispense:  30 tablet    Refill:  11    Order Specific Question:   Supervising Provider    Answer:   Delia Chimes A [id number]   PLAN  Ongoing ear pain: Concerned for soft tissue damage from fall, will refer to PT. She should continue her muscle relaxers. Additionally, if congestion is contributing, we will treat that.  EKG reassuring - no changes from previous studies.   Suggest foot soaks, abx ointment. Should this not resolve she may return to clinic for incision  Patient encouraged to call clinic with any questions, comments, or concerns.   Maximiano Coss, NP

## 2019-04-19 ENCOUNTER — Ambulatory Visit (AMBULATORY_SURGERY_CENTER): Payer: Medicare HMO | Admitting: Internal Medicine

## 2019-04-19 ENCOUNTER — Encounter: Payer: Self-pay | Admitting: Internal Medicine

## 2019-04-19 VITALS — BP 107/66 | HR 95 | Temp 96.9°F | Resp 13 | Ht 68.0 in | Wt 196.0 lb

## 2019-04-19 DIAGNOSIS — E669 Obesity, unspecified: Secondary | ICD-10-CM | POA: Diagnosis not present

## 2019-04-19 DIAGNOSIS — R1013 Epigastric pain: Secondary | ICD-10-CM | POA: Diagnosis not present

## 2019-04-19 DIAGNOSIS — I10 Essential (primary) hypertension: Secondary | ICD-10-CM | POA: Diagnosis not present

## 2019-04-19 DIAGNOSIS — K219 Gastro-esophageal reflux disease without esophagitis: Secondary | ICD-10-CM | POA: Diagnosis not present

## 2019-04-19 DIAGNOSIS — K625 Hemorrhage of anus and rectum: Secondary | ICD-10-CM

## 2019-04-19 DIAGNOSIS — G8929 Other chronic pain: Secondary | ICD-10-CM

## 2019-04-19 DIAGNOSIS — G4733 Obstructive sleep apnea (adult) (pediatric): Secondary | ICD-10-CM | POA: Diagnosis not present

## 2019-04-19 DIAGNOSIS — K5900 Constipation, unspecified: Secondary | ICD-10-CM

## 2019-04-19 MED ORDER — SODIUM CHLORIDE 0.9 % IV SOLN: 500.0000 mL | INTRAVENOUS | Status: DC

## 2019-04-19 MED ORDER — NALOXEGOL OXALATE 25 MG PO TABS: 25.0000 mg | ORAL_TABLET | Freq: Every day | ORAL | 1 refills | Status: AC

## 2019-04-19 MED FILL — MOVANTIK 25 MG TABS: 25 | 30 days supply | Qty: 30 | Fill #0

## 2019-04-19 NOTE — Op Note (Signed)
[facility] Patient Name: Margaret Fletcher Procedure Date: 04/19/2019 8:04 AM MRN: [id number] Endoscopist: Gatha Mayer , MD Age: 49 Referring MD:  Date of Birth: 12-01-1970 Gender: Female Account #: [id number] Procedure:                Colonoscopy Indications:              Rectal bleeding, Constipation Medicines:                Propofol per Anesthesia, Monitored Anesthesia Care Procedure:                Pre-Anesthesia Assessment:                           - Prior to the procedure, a History and Physical                            was performed, and patient medications and                            allergies were reviewed. The patient's tolerance of                            previous anesthesia was also reviewed. The risks                            and benefits of the procedure and the sedation                            options and risks were discussed with the patient.                            All questions were answered, and informed consent                            was obtained. Prior Anticoagulants: The patient has                            taken no previous anticoagulant or antiplatelet                            agents. ASA Grade Assessment: II - A patient with                            mild systemic disease. After reviewing the risks                            and benefits, the patient was deemed in                            satisfactory condition to undergo the procedure.                           After obtaining informed consent, the colonoscope  was passed under direct vision. Throughout the                            procedure, the patient's blood pressure, pulse, and                            oxygen saturations were monitored continuously. The                            Colonoscope was introduced through the anus and                            advanced to the the cecum, identified by                            appendiceal  orifice and ileocecal valve. The                            colonoscopy was somewhat difficult due to poor                            endoscopic visualization. Successful completion of                            the procedure was aided by lavage. The patient                            tolerated the procedure well. The quality of the                            bowel preparation was fair. The ileocecal valve,                            appendiceal orifice, and rectum were photographed.                            The bowel preparation used was Miralax via split                            dose instruction. Scope In: 8:22:25 AM Scope Out: 8:43:10 AM Scope Withdrawal Time: 0 hours 9 minutes 4 seconds  Total Procedure Duration: 0 hours 20 minutes 45 seconds  Findings:                 The perianal and digital rectal examinations were                            normal.                           A large amount of liquid semi-liquid stool was                            found in the entire colon, interfering with  visualization. Lavage of the area was performed,                            resulting in incomplete clearance with fair                            visualization.                           The exam was otherwise without abnormality on                            direct and retroflexion views. Complications:            No immediate complications. Estimated Blood Loss:     Estimated blood loss: none. Impression:               - Preparation of the colon was fair.                           - Stool in the entire examined colon.                           - The examination was otherwise normal on direct                            and retroflexion views.                           - No specimens collected. Recommendation:           - Patient has a contact number available for                            emergencies. The signs and symptoms of potential                             delayed complications were discussed with the                            patient. Return to normal activities tomorrow.                            Written discharge instructions were provided to the                            patient.                           - Resume previous diet.                           - Continue present medications.                           - [facility] SENT  SHE IS TO CALL AND SCHEDULE APPOINTMENT WITH ME FOR                            [facility] (CALL SOON)                           WILL REGROUP AND ARRANGE FOR ANOTHER COLONOSCOPY                            THAT WILL ALLOW FOR BETTER POLYP DETECTION AND                            ASSESS RESPONSE TO THERAPY                           POLYPHARMACVY MAKES THERAPY DIFFICULT Gatha Mayer, MD 04/19/2019 9:08:32 AM This report has been signed electronically.

## 2019-04-19 NOTE — Progress Notes (Signed)
Report to PACU, RN, vss, BBS= Clear.  

## 2019-04-19 NOTE — Patient Instructions (Addendum)
The esophagus and stomach pouch and intestines all look ok.  The colon prep was not good enough to show small or flat things - but no blockage or major problems seen.  You will need another colonoscopy with a better clean out sometime this year I think. I want to see if we can get your bowels moving better before we do that so I have prescribed Movantik which treats constipation due to opioids.  I am thinking that many of your symptoms stem from the constipation so if we get the bowels moving better it should help.  That said you have a complicated medication regimen which can make this more difficult.  Please call this week and get an appointment to see me in March.  I appreciate the opportunity to care for you. Gatha Mayer, MD, FACG  YOU HAD AN ENDOSCOPIC PROCEDURE TODAY AT [facility]:   Refer to the procedure report that was given to you for any specific questions about what was found during the examination.  If the procedure report does not answer your questions, please call your gastroenterologist to clarify.  If you requested that your care partner not be given the details of your procedure findings, then the procedure report has been included in a sealed envelope for you to review at your convenience later.  YOU SHOULD EXPECT: Some feelings of bloating in the abdomen. Passage of more gas than usual.  Walking can help get rid of the air that was put into your GI tract during the procedure and reduce the bloating. If you had a lower endoscopy (such as a colonoscopy or flexible sigmoidoscopy) you may notice spotting of blood in your stool or on the toilet paper. If you underwent a bowel prep for your procedure, you may not have a normal bowel movement for a few days.  Please Note:  You might notice some irritation and congestion in your nose or some drainage.  This is from the oxygen used during your procedure.  There is no need for concern and it should clear up in a day  or so.  SYMPTOMS TO REPORT IMMEDIATELY:   Following lower endoscopy (colonoscopy or flexible sigmoidoscopy):  Excessive amounts of blood in the stool  Significant tenderness or worsening of abdominal pains  Swelling of the abdomen that is new, acute  Fever of 100F or higher   Following upper endoscopy (EGD)  Vomiting of blood or coffee ground material  New chest pain or pain under the shoulder blades  Painful or persistently difficult swallowing  New shortness of breath  Fever of 100F or higher  Black, tarry-looking stools  For urgent or emergent issues, a gastroenterologist can be reached at any hour by calling [phone].   DIET:  We do recommend a small meal at first, but then you may proceed to your regular diet.  Drink plenty of fluids but you should avoid alcoholic beverages for 24 hours.  ACTIVITY:  You should plan to take it easy for the rest of today and you should NOT DRIVE or use heavy machinery until tomorrow (because of the sedation medicines used during the test).    FOLLOW UP: Our staff will call the number listed on your records 48-72 hours following your procedure to check on you and address any questions or concerns that you may have regarding the information given to you following your procedure. If we do not reach you, we will leave a message.  We will attempt to reach  you two times.  During this call, we will ask if you have developed any symptoms of COVID 19. If you develop any symptoms (ie: fever, flu-like symptoms, shortness of breath, cough etc.) before then, please call [phone].  If you test positive for Covid 19 in the 2 weeks post procedure, please call and report this information to [name].    If any biopsies were taken you will be contacted by phone or by letter within the next 1-3 weeks.  Please call us at [phone] if you have not heard about the biopsies in 3 weeks.    SIGNATURES/CONFIDENTIALITY: You and/or your care partner have signed  paperwork which will be entered into your electronic medical record.  These signatures attest to the fact that that the information above on your After Visit Summary has been reviewed and is understood.  Full responsibility of the confidentiality of this discharge information lies with you and/or your care-partner.

## 2019-04-19 NOTE — Progress Notes (Signed)
Temp by JB and vitals by KA

## 2019-04-19 NOTE — Op Note (Signed)
[place] Patient Name: Margaret Fletcher Procedure Date: 04/19/2019 8:07 AM MRN: [id number] Endoscopist: Gatha Mayer , MD Age: 49 Referring MD:  Date of Birth: Aug 16, 1970 Gender: Female Account #: [id number] Procedure:                Upper GI endoscopy Indications:              Epigastric abdominal pain Medicines:                Propofol per Anesthesia, Monitored Anesthesia Care Procedure:                Pre-Anesthesia Assessment:                           - Prior to the procedure, a History and Physical                            was performed, and patient medications and                            allergies were reviewed. The patient's tolerance of                            previous anesthesia was also reviewed. The risks                            and benefits of the procedure and the sedation                            options and risks were discussed with the patient.                            All questions were answered, and informed consent                            was obtained. Prior Anticoagulants: The patient has                            taken no previous anticoagulant or antiplatelet                            agents. ASA Grade Assessment: II - A patient with                            mild systemic disease. After reviewing the risks                            and benefits, the patient was deemed in                            satisfactory condition to undergo the procedure.                           After obtaining informed consent, the endoscope was  passed under direct vision. Throughout the                            procedure, the patient's blood pressure, pulse, and                            oxygen saturations were monitored continuously. The                            Endoscope was introduced through the mouth, and                            advanced to the second part of duodenum. The upper                            GI  endoscopy was accomplished without difficulty.                            The patient tolerated the procedure well. Scope In: Scope Out: Findings:                 The examined esophagus was normal.                           Evidence of a gastric bypass was found. A gastric                            pouch with a 5 cm length from the GE junction to                            the gastrojejunal anastomosis was found. The                            gastrojejunal anastomosis was characterized by                            healthy appearing mucosa. This was traversed.                           The examined jejunum was normal. Complications:            No immediate complications. Estimated Blood Loss:     Estimated blood loss: none. Impression:               - Normal esophagus.                           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                            Gastrojejunal anastomosis characterized by healthy                            appearing mucosa. 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                            Able to retroflex                           - Normal examined jejunum.                           - No specimens collected.                           - NO CAUSE OF SXS HERE Recommendation:           - Patient has a contact number available for                            emergencies. The signs and symptoms of potential                            delayed complications were discussed with the                            patient. Return to normal activities tomorrow.                            Written discharge instructions were provided to the                            patient.                           - Resume previous diet.                           - Continue present medications.                           - See the other procedure note for documentation of                            additional  recommendations. Gatha Mayer, MD 04/19/2019 9:05:03 AM This report has been signed electronically.

## 2019-04-21 ENCOUNTER — Telehealth: Payer: Self-pay

## 2019-04-21 NOTE — Telephone Encounter (Signed)
  Follow up Call-  Call back number 04/19/2019  Post procedure Call Back phone  # [phone]  Permission to leave phone message Yes  Some recent data might be hidden     Patient questions:  Do you have a fever, pain , or abdominal swelling? No. Pain Score  0 *  Have you tolerated food without any problems? Yes.    Have you been able to return to your normal activities? Yes.    Do you have any questions about your discharge instructions: Diet   No. Medications  No. Follow up visit  No.  Do you have questions or concerns about your Care? No.  Actions: * If pain score is 4 or above: No action needed, pain <4. 1. Have you developed a fever since your procedure? no  2.   Have you had an respiratory symptoms (SOB or cough) since your procedure? no  3.   Have you tested positive for COVID 19 since your procedure no  4.   Have you had any family members/close contacts diagnosed with the COVID 19 since your procedure?  no   If yes to any of these questions please route to Joylene John, RN and Alphonsa Gin, Therapist, sports.

## 2019-04-28 ENCOUNTER — Ambulatory Visit: Payer: Medicare HMO | Admitting: Adult Health Nurse Practitioner

## 2019-05-01 MED FILL — MOVANTIK 25 MG TABS: 25 | 30 days supply | Qty: 30 | Fill #0

## 2019-05-01 MED FILL — OMEPRAZOLE 20 MG CAP: 20 | 30 days supply | Qty: 30 | Fill #1

## 2019-05-01 MED FILL — MOMETASONE FUROATE 0.1% SOL: 0.1 | 20 days supply | Qty: 30 | Fill #1

## 2019-05-03 ENCOUNTER — Other Ambulatory Visit (INDEPENDENT_AMBULATORY_CARE_PROVIDER_SITE_OTHER): Payer: Medicare HMO

## 2019-05-03 ENCOUNTER — Encounter: Payer: Self-pay | Admitting: Internal Medicine

## 2019-05-03 ENCOUNTER — Ambulatory Visit (INDEPENDENT_AMBULATORY_CARE_PROVIDER_SITE_OTHER): Payer: Medicare HMO | Admitting: Internal Medicine

## 2019-05-03 VITALS — BP 105/70 | HR 78 | Temp 98.4°F | Ht 68.0 in | Wt 197.0 lb

## 2019-05-03 DIAGNOSIS — K912 Postsurgical malabsorption, not elsewhere classified: Secondary | ICD-10-CM

## 2019-05-03 DIAGNOSIS — K5903 Drug induced constipation: Secondary | ICD-10-CM

## 2019-05-03 DIAGNOSIS — Z79899 Other long term (current) drug therapy: Secondary | ICD-10-CM | POA: Diagnosis not present

## 2019-05-03 DIAGNOSIS — K625 Hemorrhage of anus and rectum: Secondary | ICD-10-CM

## 2019-05-03 DIAGNOSIS — R430 Anosmia: Secondary | ICD-10-CM

## 2019-05-03 DIAGNOSIS — G8929 Other chronic pain: Secondary | ICD-10-CM | POA: Diagnosis not present

## 2019-05-03 DIAGNOSIS — R1013 Epigastric pain: Secondary | ICD-10-CM | POA: Diagnosis not present

## 2019-05-03 DIAGNOSIS — T402X5A Adverse effect of other opioids, initial encounter: Secondary | ICD-10-CM

## 2019-05-03 DIAGNOSIS — Z01818 Encounter for other preprocedural examination: Secondary | ICD-10-CM

## 2019-05-03 LAB — PROTIME-INR
INR: 1 ratio (ref 0.8–1.0)
Prothrombin Time: 11.4 s (ref 9.6–13.1)

## 2019-05-03 LAB — FOLATE: Folate: >23.7 ng/mL (ref >5.9)

## 2019-05-03 LAB — VITAMIN D 25 HYDROXY (VIT D DEFICIENCY, FRACTURES): VITD: 20.74 ng/mL — ABNORMAL LOW (ref 30.00–100.00)

## 2019-05-03 NOTE — Patient Instructions (Addendum)
Your provider has requested that you go to the basement level for lab work before leaving today. Press "B" on the elevator. The lab is located at the first door on the left as you exit the elevator.  You have been scheduled for a colonoscopy. Please follow written instructions given to you at your visit today.  Please pick up your prep supplies at the pharmacy within the next 1-3 days. If you use inhalers (even only as needed), please bring them with you on the day of your procedure.  Please call us on Monday March 8th with an update of how the prep went on Sunday.   I appreciate the opportunity to care for you. Silvano Rusk, MD, [facility]

## 2019-05-03 NOTE — Progress Notes (Signed)
Margaret Fletcher 49 y.o. 12-08-1970 [id number]  Assessment & Plan:   Encounter Diagnoses  Name Primary?  . Rectal bleeding Yes  . Constipation due to opioid therapy   . Postsurgical malabsorption   . Anosmia   . Abdominal pain, chronic, epigastric   . Polypharmacy   . Encounter for other preprocedural examination    Very complicated situation with many symptoms that have defied explanation and do not respond to therapy.  Will have her do colonoscopy with double prep. Continue Movantik for now. Lab assessment post bariatric surgery as below - ? If she could have a deficiency that is contributing to her sxs  Orders Placed This Encounter  Procedures  . SARS Coronavirus 2 ([facility] ONLY)  . Vitamin A  . Vitamin D (25 hydroxy)  . Vitamin E  . Vitamin B1  . Selenium serum  . Zinc  . Ceruloplasmin  . Folate  . Protime-INR  . Ambulatory referral to Gastroenterology    Consider change in therapeutic approach - do think getting back to psychiatry would be important.   Tearful after I explained how complicated her situation is and that I could not offer a therapy today but would review her situation in more detail while awaiting labs and colonoscopy. This suggests some lack of insght into the complexity of her situation though I can understand why she is frustrated.   ? Vagal injury?   [id number], Lilia Argue, MD Greer Pickerel, MD Dr. 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 Also w/ chronic constipation.  See GI NP  note of 03/23/2019 for additional information. Sxs of epigastric pain, globus, heartburn. Chronic constipation and rectal bleeding.  She had EGD 04/1019 - NL post-op -exam  Colonoscopy same day failed - prep poor.  Here to reschedule colonoscopy. I started Movantik 25 ug per day - she thinks not much  different.  We reviewed her chronic epigastric pain. Says did not have pre-op.  Celiac plexus block x 2 not helpful. "They say it takes 3 sometimes but it is so painful and has not helped so I have not done a third" On chronic Belbuca (buprenorphine) through pain specialist  No hx gabapentin, Lyrica, duolxetine she thinks.  Complicated medication regimen - e.g. on topiramate - causes brain fog so needs Adderall.  Insurance change caused loss of psychiatrist and has not been to one in a while. Pristiq not helping depression.  Takes bariatric vitamins, supplements  Also on elderberry syrup, turmeric, ginger, holy basil supplements.  Also asking about anosmia and some dysgeusia ever since ICH issues. Followed by neuro     Allergies  Allergen Reactions  . Sulfa Antibiotics Anaphylaxis  . Metoclopramide Other (See Comments)    Elevated prolactin >5x ULN induction lactation when taking scheduled for several months  . Zosyn [Piperacillin Sod-Tazobactam So] Rash    Has patient had a PCN reaction causing immediate rash, facial/tongue/throat swelling, SOB or lightheadedness with hypotension: No Has patient had a PCN reaction causing severe rash involving mucus membranes or skin necrosis: No Has patient had a PCN reaction that required hospitalization: Unknown--inpatient when reaction occurred Has patient had a PCN reaction occurring within the last 10 years: Yes If all of the above answers are "NO", then may proceed with Cephalosporin use.    Current Meds  Medication Sig  . albuterol (PROVENTIL HFA;VENTOLIN HFA)  108 (90 Base) MCG/ACT inhaler Inhale 2 puffs into the lungs every 6 (six) hours as needed for wheezing or shortness of breath.  Marland Kitchen amitriptyline (ELAVIL) 10 MG tablet TAKE 2 TABLETS BY MOUTH AT BEDTIME.  Marland Kitchen azelastine (ASTELIN) 0.1 % nasal spray Place 1 spray into both nostrils 2 (two) times daily. Use in each nostril as directed  . BAYER MICROLET LANCETS lancets Use as  instructed  . BELBUCA 450 MCG FILM Take by mouth 2 (two) times daily.  . Buprenorphine HCl 300 MCG FILM Place 450 mcg inside cheek every 12 (twelve) hours. Can also take for breakthrough pain per patient   . busPIRone (BUSPAR) 5 MG tablet Take 1 tablet (5 mg total) by mouth 3 (three) times daily.  Marland Kitchen CALCIUM-VITAMIN D PO Take 1 tablet by mouth 2 (two) times daily. chewable  . cetirizine (ZYRTEC) 10 MG tablet Take 1 tablet (10 mg total) by mouth daily.  . chlorhexidine (PERIDEX) 0.12 % solution Use as directed 15 mLs in the mouth or throat 2 (two) times daily.  . Cyanocobalamin (VITAMIN B-12 SL) Place 1 drop under the tongue daily. 1 dropperful  . cyclobenzaprine (FLEXERIL) 5 MG tablet TAKE 1 TABLET BY MOUTH 3 TIMES DAILY AS NEEDED FOR MUSCLE SPASMS. MAY TAKE 2 TABS BY MOUTH AT BEDTIME  . desvenlafaxine (PRISTIQ) 100 MG 24 hr tablet TAKE 1 TABLET BY MOUTH ONCE A DAY  . docusate sodium (COLACE) 100 MG capsule Take 100 mg by mouth daily as needed.   . eletriptan (RELPAX) 40 MG tablet Take 1 tablet (40 mg total) by mouth every 2 (two) hours as needed for migraine or headache. Do not use >2 doses/24 hours  . famotidine (PEPCID) 20 MG tablet Take 1 tablet (20 mg total) by mouth 2 (two) times daily.  . fluticasone (FLONASE) 50 MCG/ACT nasal spray Place 2 sprays into both nostrils daily.  Marland Kitchen glucose blood (CONTOUR NEXT TEST) test strip 1 each by Other route 4 (four) times daily as needed (hypoglycemia). K91.2  . hydrocortisone (ANUSOL-HC) 25 MG suppository Place 1 suppository (25 mg total) rectally at bedtime. Use for 5 nights  . mometasone (ELOCON) 0.1 % lotion Apply 3 drops in affected ear(s) three times daily for 2-4 days until itching is resolved. Repeat as needed.  . Multiple Vitamins-Minerals (BARIATRIC MULTIVITAMINS/IRON PO) Take 1 tablet by mouth 3 (three) times daily.  . naloxegol oxalate (MOVANTIK) 25 MG TABS tablet Take 1 tablet (25 mg total) by mouth daily.  . Omega-3 Fatty Acids (OMEGA-3 FISH  OIL PO) Take by mouth daily.  Marland Kitchen omeprazole (PRILOSEC) 20 MG capsule Take 1 capsule (20 mg total) by mouth every morning.  . ondansetron (ZOFRAN ODT) 4 MG disintegrating tablet Take 1 tablet (4 mg total) by mouth every 8 (eight) hours as needed for nausea or vomiting.  Marland Kitchen OVER THE COUNTER MEDICATION Meca root with xulane patch for hormone and premenopausal sx (wears all week).  Marland Kitchen OVER THE COUNTER MEDICATION Calcium, magnesium, zinc with D3 (one tablet daily)  . OVER THE COUNTER MEDICATION Collagen and vitamin c and biotin (takes one daily)  . OVER THE COUNTER MEDICATION Tunic for muscle and joint health  . polyethylene glycol (MIRALAX / GLYCOLAX) packet Take 17 g by mouth daily as needed (for constipation.).   Marland Kitchen PREMARIN vaginal cream   . simethicone (MYLICON) 80 MG chewable tablet Chew 80 mg by mouth every 6 (six) hours as needed for flatulence.  . topiramate (TOPAMAX) 100 MG tablet Take 1.5 tablets (150 mg  total) by mouth daily.  Marland Kitchen triamterene-hydrochlorothiazide (MAXZIDE-25) 37.5-25 MG tablet Take 1 tablet by mouth daily.   Past Medical History:  Diagnosis Date  . Adie's pupil, left   . Alopecia   . Anxiety   . Autoimmune disease ([place])    "Alopecia"  . Bacterial vaginosis   . Cyst of spleen   . Depression   . Disorder of oral soft tissue 03/17/2019  . Frequent UTI   . Hypertension   . Hypertrophy of breast   . IIH (idiopathic intracranial hypertension) 2017   "related to headaches"  . Injury of jawline 03/17/2019  . Migraine headache    denies, ruled out - Propranolol.Using for headaches  . Non-suppurative otitis media 03/17/2019   Past Surgical History:  Procedure Laterality Date  . APPENDECTOMY    . BREAST EXCISIONAL BIOPSY Right 2017   removed at time of reduction  . BREAST REDUCTION SURGERY Bilateral 03/26/2015   Procedure: BILATERAL BREAST REDUCTION  ;  Surgeon: Youlanda Roys, MD;  Location: [facility];  Service: Plastics;  Laterality: Bilateral;  .  BREATH TEK H PYLORI N/A 05/21/2014   Procedure: BREATH TEK H PYLORI;  Surgeon: Greer Pickerel, MD;  Location: [facility];  Service: General;  Laterality: N/A;  . CELIAC PLEXUS BLOCK  07/18/2018   Per patient it was done at [facility]    . CESAREAN SECTION    . CHOLECYSTECTOMY    . DILITATION & CURRETTAGE/HYSTROSCOPY WITH NOVASURE ABLATION N/A 07/19/2012   Procedure: HYSTEROSCOPY WITH NOVASURE ABLATION;  Surgeon: Farrel Gobble. Harrington Challenger, MD;  Location: [facility];  Service: Gynecology;  Laterality: N/A;  . ENDOMETRIAL ABLATION    . ESOPHAGOGASTRODUODENOSCOPY N/A 08/16/2015   Procedure: UPPER ESOPHAGOGASTRODUODENOSCOPY (EGD);  Surgeon: Greer Pickerel, MD;  Location: [facility];  Service: General;  Laterality: N/A;  . ESOPHAGOGASTRODUODENOSCOPY N/A 02/21/2016   Procedure: ESOPHAGOGASTRODUODENOSCOPY (EGD);  Surgeon: Greer Pickerel, MD;  Location: [facility];  Service: General;  Laterality: N/A;  . ESOPHAGOGASTRODUODENOSCOPY N/A 02/24/2017   Procedure: ESOPHAGOGASTRODUODENOSCOPY (EGD);  Surgeon: Greer Pickerel, MD;  Location: [facility];  Service: General;  Laterality: N/A;  . IR GENERIC HISTORICAL  10/04/2015   IR [place] Vivianne Master 10/04/2015 Markus Daft, MD [facility]  . IR GENERIC HISTORICAL  01/31/2016   IR [place] GASTRO/COLONIC TUBE PERCUT W/FLUORO 01/31/2016 Corrie Mckusick, DO [facility]  . IR GENERIC HISTORICAL  04/06/2016   IR [place] TUBE PERCUT W/FLUORO 04/06/2016 Arne Cleveland, MD [facility]  . IR GENERIC HISTORICAL  05/14/2016   IR GASTRIC TUBE PERC CHG W/O IMG GUIDE 05/14/2016 Sandi Mariscal, MD [facility]  . LAPAROSCOPIC GASTROSTOMY N/A 09/30/2015   Procedure: LAPAROSCOPIC GASTROSTOMY TUBE PLACEMENT;  Surgeon: Greer Pickerel, MD;  Location: [facility];  Service: General;  Laterality: N/A;  . LAPAROSCOPIC ROUX-EN-Y GASTRIC BYPASS WITH HIATAL HERNIA REPAIR N/A 07/08/2015   Procedure: LAPAROSCOPIC ROUX-EN-Y GASTRIC BYPASS WITH HIATAL HERNIA REPAIR WITH UPPER  ENDOSCOPY;  Surgeon: Greer Pickerel, MD;  Location: [facility];  Service: General;  Laterality: N/A;  . LAPAROSCOPIC SMALL BOWEL RESECTION N/A 09/30/2015   Procedure: LAPAROSCOPIC REVISION OF ROUX LIMB;  Surgeon: Greer Pickerel, MD;  Location: [facility];  Service: General;  Laterality: N/A;  . LAPAROSCOPY N/A 09/30/2015   Procedure: LAPAROSCOPY DIAGNOSTIC;  Surgeon: Greer Pickerel, MD;  Location: [facility];  Service: General;  Laterality: N/A;  . REDUCTION MAMMAPLASTY Bilateral 2017  . TUBAL LIGATION    . [address] EXTRACTION     Social History  Social History Narrative   Lives with partner   Caffeine use: minimal coffee   Right-handed   family history includes Breast cancer in her maternal aunt, maternal aunt, maternal aunt, and mother; Cancer in her mother; Colon polyps in her father; Diabetes in her brother; Seizures in her son; Stroke in her father.   Review of Systems As above  Objective:   Physical Exam BP 105/70   Pulse 78   Temp 98.4 F (36.9 C)   Ht 5\' 8"  (1.727 m)   Wt 197 lb (89.4 kg)   BMI 29.95 kg/m

## 2019-05-09 ENCOUNTER — Other Ambulatory Visit: Payer: Self-pay

## 2019-05-09 ENCOUNTER — Ambulatory Visit: Payer: Medicare HMO | Attending: Registered Nurse

## 2019-05-09 DIAGNOSIS — M436 Torticollis: Secondary | ICD-10-CM | POA: Diagnosis not present

## 2019-05-09 DIAGNOSIS — R293 Abnormal posture: Secondary | ICD-10-CM | POA: Insufficient documentation

## 2019-05-09 DIAGNOSIS — M542 Cervicalgia: Secondary | ICD-10-CM | POA: Diagnosis not present

## 2019-05-09 LAB — TEST AUTHORIZATION

## 2019-05-09 LAB — VITAMIN A: Vitamin A (Retinoic Acid): 46 ug/dL (ref 38–98)

## 2019-05-09 LAB — CERULOPLASMIN: Ceruloplasmin: 40 mg/dL (ref 18–53)

## 2019-05-09 LAB — VITAMIN E
Gamma-Tocopherol (Vit E): 1.2 mg/L (ref <=4.3)
Vitamin E (Alpha Tocopherol): 7.8 mg/L (ref 5.7–19.9)

## 2019-05-09 LAB — ZINC: Zinc: 73 ug/dL (ref 60–130)

## 2019-05-09 LAB — COPPER, SERUM: Copper: 170 ug/dL (ref 70–175)

## 2019-05-09 LAB — VITAMIN B1: Vitamin B1 (Thiamine): 8 nmol/L (ref 8–30)

## 2019-05-09 LAB — SELENIUM SERUM: Selenium, Blood: 113 mcg/L (ref 63–160)

## 2019-05-09 NOTE — Therapy (Deleted)
[place], [place], [address] Phone: [phone]   Fax:  [phone]  Physical Therapy Evaluation  Patient Details  Name: Margaret Fletcher MRN: [id number] Date of Birth: Feb 02, 1971 No data recorded  Encounter Date: 05/09/2019  PT End of Session - 05/09/19 2215    Visit Number  1    Number of Visits  9    Date for PT Re-Evaluation  06/13/19    Authorization Type  [employer]    PT Start Time  1455    PT Stop Time  1545    PT Time Calculation (min)  50 min    Activity Tolerance  Patient limited by pain    Behavior During Therapy  [facility] for tasks assessed/performed       Past Medical History:  Diagnosis Date  . Adie's pupil, left   . Alopecia   . Anxiety   . Autoimmune disease ([place])    "Alopecia"  . Bacterial vaginosis   . Cyst of spleen   . Depression   . Disorder of oral soft tissue 03/17/2019  . Frequent UTI   . Hypertension   . Hypertrophy of breast   . IIH (idiopathic intracranial hypertension) 2017   "related to headaches"  . Injury of jawline 03/17/2019  . Migraine headache    denies, ruled out - Propranolol.Using for headaches  . Non-suppurative otitis media 03/17/2019    Past Surgical History:  Procedure Laterality Date  . APPENDECTOMY    . BREAST EXCISIONAL BIOPSY Right 2017   removed at time of reduction  . BREAST REDUCTION SURGERY Bilateral 03/26/2015   Procedure: BILATERAL BREAST REDUCTION  ;  Surgeon: Youlanda Roys, MD;  Location: [place];  Service: Plastics;  Laterality: Bilateral;  . BREATH TEK H PYLORI N/A 05/21/2014   Procedure: BREATH TEK H PYLORI;  Surgeon: Greer Pickerel, MD;  Location: [facility];  Service: General;  Laterality: N/A;  . CELIAC PLEXUS BLOCK  07/18/2018   Per patient it was done at [place]    . CESAREAN SECTION    . CHOLECYSTECTOMY    . DILITATION & CURRETTAGE/HYSTROSCOPY WITH NOVASURE ABLATION N/A  07/19/2012   Procedure: HYSTEROSCOPY WITH NOVASURE ABLATION;  Surgeon: Farrel Gobble. Harrington Challenger, MD;  Location: [facility];  Service: Gynecology;  Laterality: N/A;  . ENDOMETRIAL ABLATION    . ESOPHAGOGASTRODUODENOSCOPY N/A 08/16/2015   Procedure: UPPER ESOPHAGOGASTRODUODENOSCOPY (EGD);  Surgeon: Greer Pickerel, MD;  Location: [facility];  Service: General;  Laterality: N/A;  . ESOPHAGOGASTRODUODENOSCOPY N/A 02/21/2016   Procedure: ESOPHAGOGASTRODUODENOSCOPY (EGD);  Surgeon: Greer Pickerel, MD;  Location: [facility];  Service: General;  Laterality: N/A;  . ESOPHAGOGASTRODUODENOSCOPY N/A 02/24/2017   Procedure: ESOPHAGOGASTRODUODENOSCOPY (EGD);  Surgeon: Greer Pickerel, MD;  Location: [facility];  Service: General;  Laterality: N/A;  . IR GENERIC HISTORICAL  10/04/2015   IR [place] Vivianne Master 10/04/2015 Markus Daft, MD [facility]  . IR GENERIC HISTORICAL  01/31/2016   IR [place] GASTRO/COLONIC TUBE PERCUT W/FLUORO 01/31/2016 Corrie Mckusick, DO [facility]  . IR GENERIC HISTORICAL  04/06/2016   IR [place] TUBE PERCUT W/FLUORO 04/06/2016 Arne Cleveland, MD [facility]  . IR GENERIC HISTORICAL  05/14/2016   IR GASTRIC TUBE PERC CHG W/O IMG GUIDE 05/14/2016 Sandi Mariscal, MD [facility]  . LAPAROSCOPIC GASTROSTOMY N/A 09/30/2015   Procedure: LAPAROSCOPIC GASTROSTOMY TUBE PLACEMENT;  Surgeon: Greer Pickerel, MD;  [facility];  Service: General;  Laterality: N/A;  .  LAPAROSCOPIC ROUX-EN-Y GASTRIC BYPASS WITH HIATAL HERNIA REPAIR N/A 07/08/2015   Procedure: LAPAROSCOPIC ROUX-EN-Y GASTRIC BYPASS WITH HIATAL HERNIA REPAIR WITH UPPER ENDOSCOPY;  Surgeon: Greer Pickerel, MD;  [facility];  Service: General;  Laterality: N/A;  . LAPAROSCOPIC SMALL BOWEL RESECTION N/A 09/30/2015   Procedure: LAPAROSCOPIC REVISION OF ROUX LIMB;  Surgeon: Greer Pickerel, MD;  [facility];  Service: General;  Laterality: N/A;  . LAPAROSCOPY N/A 09/30/2015   Procedure: LAPAROSCOPY DIAGNOSTIC;  Surgeon: Greer Pickerel,  MD;  [facility];  Service: General;  Laterality: N/A;  . REDUCTION MAMMAPLASTY Bilateral 2017  . TUBAL LIGATION    . WISDOM TOOTH EXTRACTION      There were no vitals filed for this visit.   Subjective Assessment - 05/09/19 2138    Subjective  Pt reports R neck and arm pain and numbness and jaw pain. The R neck and arm symptoms started around Thanksgiving 2020 and jaw pain developed following a fall around Christmas 2020. Pt reports her R neck and arm is her greatest concern.    Limitations  Sitting;Reading;Lifting;Standing;Walking    How long can you sit comfortably?  30 mins    How long can you stand comfortably?  15 mins    How long can you walk comfortably?  30 mins    Patient Stated Goals  For my pain to improve    Currently in Pain?  Yes    Pain Score  8     Pain Location  Neck    Pain Orientation  Right;Posterior    Pain Descriptors / Indicators  Aching;Sharp    Pain Type  Chronic pain;Other (Comment)   4 months ago   Pain Radiating Towards  R arm to hand    Pain Onset  Other (comment)   4 months ago   Pain Frequency  Constant    Aggravating Factors   Driving, prolonged sitting and standing    Pain Relieving Factors  Prednisone which she is currently unable to take    Effect of Pain on Daily Activities  Significnatly limits ADLs    Multiple Pain Sites  Yes    Pain Score  4    Pain Location  Jaw    Pain Orientation  Left;Right    Pain Descriptors / Indicators  Aching    Pain Type  Chronic pain    Pain Radiating Towards  NA    Pain Onset  --   3 months ago   Pain Frequency  Intermittent    Aggravating Factors   Chewing    Pain Relieving Factors  Prednisone which she is no longer taking    Effect of Pain on Daily Activities  Eating                    Objective measurements completed on examination: See above findings.              PT Education - 05/09/19 2210    Education Details  HEP and recommendations re: maintaining proper posture  with assist from lumbar support is sitting and driving, and for sleeping postures while supine and SL.    Person(s) Educated  Patient    Methods  Explanation;Demonstration;Tactile cues;Verbal cues;Handout    Comprehension  Verbalized understanding;Returned demonstration                  Plan - 05/09/19 2243    Clinical Impression Statement  Pt presents with neck and R arm pain and numbness radiating  to R hand; and jaw pain. Pt reports her neck and arm symptoms are her greatest concern. Except L cervical rotation all cervical movements were painful with extension being the greatest. Pt's R arm pain and numbness was not able to to be centralized. Manual traction provided temporary decrease in cervical pain as applied. Pt presents with signs and symptoms consistent with cervical radiculopathy. Pt will benefit from PT 2w4 to address cervical, R arm and jaw pain; ROM, and to improve pt's ability to tolerate and participate in ADLs.    Personal Factors and Comorbidities  Comorbidity 2    Comorbidities  Cervical and R arm pain and numbness; anxiety    Examination-Activity Limitations  Bed Mobility;Bathing;Lift;Bend;Carry;Dressing;Sleep;Reach Overhead;Stand;Sit;Transfers    Examination-Participation Restrictions  Laundry;Community Activity;Yard Work;Shop    Stability/Clinical Decision Making  Evolving/Moderate complexity    Clinical Decision Making  Moderate    Rehab Potential  Good    PT Frequency  2x / week    PT Duration  4 weeks    PT Treatment/Interventions  ADLs/Self Care Home Management;Electrical Stimulation;Iontophoresis 4mg /ml Dexamethasone;Traction;Moist Heat;Ultrasound;Gait training;Functional mobility training;Balance training;Therapeutic exercise;Therapeutic activities;Patient/family education;Manual techniques;Passive range of motion;Dry needling;Taping;Energy conservation    PT Next Visit Plan  Assess pt's response to HEP and posture instructions. Assess pt's response to  directional cervical movements for reduction/centralization of pain. Utilize traction and STM as indicated.    PT Home Exercise Plan  [id number]    Consulted and Agree with Plan of Care  Patient       Patient will benefit from skilled therapeutic intervention in order to improve the following deficits and impairments:  Improper body mechanics, Pain, Decreased mobility, Decreased activity tolerance, Decreased range of motion, Impaired UE functional use, Decreased balance  Visit Diagnosis: No diagnosis found.     Problem List Patient Active Problem List   Diagnosis Date Noted  . Spondylosis, cervical, with myelopathy 03/17/2019  . 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 Low back pain 03/17/2019  . Non-suppurative otitis media 03/17/2019  . Injury of jawline 03/17/2019  . Disorder of oral soft tissue 03/17/2019  . Reactive depression 06/13/2017  . Chronic pain syndrome 06/13/2017  . Polypharmacy 06/13/2017  . Empty sella syndrome ([place]) 05/01/2017  . Adjustment disorder with mixed anxiety and depressed mood 10/13/2016  . 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 Hypokalemia 02/22/2016  . Constipation, chronic 02/22/2016  . Memory loss due to medical condition 12/14/2015  . Chronic fatigue 12/14/2015  . Recurrent dehydration 09/30/2015  . Chronic nausea 08/23/2015  . Abdominal pain, chronic, epigastric 08/20/2015  . Protein-calorie malnutrition, moderate ([place]) 08/19/2015  . Mild obstructive sleep apnea 07/08/2015  . Dyslipidemia 07/08/2015  . S/P gastric bypass 07/08/2015  . IIH (idiopathic intracranial hypertension) 06/03/2015  . Vision changes 04/30/2015  . Perceived hearing changes 04/30/2015  . Chronic daily headache 04/21/2014  . Liver lesion 03/21/2013  . Splenic cyst 03/21/2013    Gar Ponto 05/09/2019, 11:27 PM  [facility] [address] [place], [place], [address] Phone: [phone]    Fax:  [phone]  Name: Margaret Fletcher MRN: [id number] Date of Birth: [birth year]

## 2019-05-10 NOTE — Therapy (Signed)
[place], [place], [address] Phone: [phone]   Fax:  [phone]  Physical Therapy Evaluation  Patient Details  Name: Margaret Fletcher MRN: [id number] Date of Birth: 1970/11/15 Referring Provider (PT): Grant Fontana, MD   Encounter Date: 05/09/2019  PT End of Session - 05/09/19 2215    Visit Number  1    Number of Visits  9    Date for PT Re-Evaluation  06/13/19    Authorization Type  [employer]    PT Start Time  1455    PT Stop Time  1545    PT Time Calculation (min)  50 min    Activity Tolerance  Patient limited by pain    Behavior During Therapy  [facility] for tasks assessed/performed       Past Medical History:  Diagnosis Date  . Adie's pupil, left   . Alopecia   . Anxiety   . Autoimmune disease ([place])    "Alopecia"  . Bacterial vaginosis   . Cyst of spleen   . Depression   . Disorder of oral soft tissue 03/17/2019  . Frequent UTI   . Hypertension   . Hypertrophy of breast   . IIH (idiopathic intracranial hypertension) 2017   "related to headaches"  . Injury of jawline 03/17/2019  . Migraine headache    denies, ruled out - Propranolol.Using for headaches  . Non-suppurative otitis media 03/17/2019    Past Surgical History:  Procedure Laterality Date  . APPENDECTOMY    . BREAST EXCISIONAL BIOPSY Right 2017   removed at time of reduction  . BREAST REDUCTION SURGERY Bilateral 03/26/2015   Procedure: BILATERAL BREAST REDUCTION  ;  Surgeon: Youlanda Roys, MD;  Location: [place];  Service: Plastics;  Laterality: Bilateral;  . BREATH TEK H PYLORI N/A 05/21/2014   Procedure: BREATH TEK H PYLORI;  Surgeon: Greer Pickerel, MD;  Location: [facility];  Service: General;  Laterality: N/A;  . CELIAC PLEXUS BLOCK  07/18/2018   Per patient it was done at [place]    . CESAREAN SECTION    . CHOLECYSTECTOMY    . DILITATION & CURRETTAGE/HYSTROSCOPY WITH  NOVASURE ABLATION N/A 07/19/2012   Procedure: HYSTEROSCOPY WITH NOVASURE ABLATION;  Surgeon: Farrel Gobble. Harrington Challenger, MD;  Location: [facility];  Service: Gynecology;  Laterality: N/A;  . ENDOMETRIAL ABLATION    . ESOPHAGOGASTRODUODENOSCOPY N/A 08/16/2015   Procedure: UPPER ESOPHAGOGASTRODUODENOSCOPY (EGD);  Surgeon: Greer Pickerel, MD;  Location: [facility];  Service: General;  Laterality: N/A;  . ESOPHAGOGASTRODUODENOSCOPY N/A 02/21/2016   Procedure: ESOPHAGOGASTRODUODENOSCOPY (EGD);  Surgeon: Greer Pickerel, MD;  Location: [facility];  Service: General;  Laterality: N/A;  . ESOPHAGOGASTRODUODENOSCOPY N/A 02/24/2017   Procedure: ESOPHAGOGASTRODUODENOSCOPY (EGD);  Surgeon: Greer Pickerel, MD;  Location: [facility];  Service: General;  Laterality: N/A;  . IR GENERIC HISTORICAL  10/04/2015   IR [place] Vivianne Master 10/04/2015 Markus Daft, MD [facility]  . IR GENERIC HISTORICAL  01/31/2016   IR [place] GASTRO/COLONIC TUBE PERCUT W/FLUORO 01/31/2016 Corrie Mckusick, DO [facility]  . IR GENERIC HISTORICAL  04/06/2016   IR [place] TUBE PERCUT W/FLUORO 04/06/2016 Arne Cleveland, MD [facility]  . IR GENERIC HISTORICAL  05/14/2016   IR GASTRIC TUBE PERC CHG W/O IMG GUIDE 05/14/2016 Sandi Mariscal, MD [facility]  . LAPAROSCOPIC GASTROSTOMY N/A 09/30/2015   Procedure: LAPAROSCOPIC GASTROSTOMY TUBE PLACEMENT;  Surgeon: Greer Pickerel, MD;  [facility];  Service: General;  Laterality: N/A;  . LAPAROSCOPIC ROUX-EN-Y GASTRIC BYPASS WITH HIATAL HERNIA REPAIR N/A 07/08/2015   Procedure: LAPAROSCOPIC ROUX-EN-Y GASTRIC BYPASS WITH HIATAL HERNIA REPAIR WITH UPPER ENDOSCOPY;  Surgeon: Greer Pickerel, MD;  [facility];  Service: General;  Laterality: N/A;  . LAPAROSCOPIC SMALL BOWEL RESECTION N/A 09/30/2015   Procedure: LAPAROSCOPIC REVISION OF ROUX LIMB;  Surgeon: Greer Pickerel, MD;  [facility];  Service: General;  Laterality: N/A;  . LAPAROSCOPY N/A 09/30/2015   Procedure: LAPAROSCOPY DIAGNOSTIC;   Surgeon: Greer Pickerel, MD;  [facility];  Service: General;  Laterality: N/A;  . REDUCTION MAMMAPLASTY Bilateral 2017  . TUBAL LIGATION    . WISDOM TOOTH EXTRACTION      There were no vitals filed for this visit.   Subjective Assessment - 05/09/19 2138    Subjective  Pt reports R neck and arm pain and numbness and jaw pain. The R neck and arm symptoms started around Thanksgiving 2020 and jaw pain developed following a fall around Christmas 2020. Pt reports her R neck and arm is her greatest concern.    Limitations  Sitting;Reading;Lifting;Standing;Walking    How long can you sit comfortably?  30 mins    How long can you stand comfortably?  15 mins    How long can you walk comfortably?  30 mins    Patient Stated Goals  For my pain to improve    Currently in Pain?  Yes    Pain Score  8     Pain Location  Neck    Pain Orientation  Right;Posterior    Pain Descriptors / Indicators  Aching;Sharp    Pain Type  Chronic pain;Other (Comment)   4 months ago   Pain Radiating Towards  R arm to hand    Pain Onset  Other (comment)   4 months ago   Pain Frequency  Constant    Aggravating Factors   Driving, prolonged sitting and standing    Pain Relieving Factors  Prednisone which she is currently unable to take    Effect of Pain on Daily Activities  Significnatly limits ADLs    Multiple Pain Sites  Yes    Pain Score  4    Pain Location  Jaw    Pain Orientation  Left;Right    Pain Descriptors / Indicators  Aching    Pain Type  Chronic pain    Pain Radiating Towards  NA    Pain Onset  --   3 months ago   Pain Frequency  Intermittent    Aggravating Factors   Chewing    Pain Relieving Factors  Prednisone which she is no longer taking    Effect of Pain on Daily Activities  Eating         [facility] PT Assessment - 05/10/19 0001      Assessment   Medical Diagnosis  --   Jaw/Cervical pain   Referring Provider (PT)  Grant Fontana, MD    Onset Date/Surgical Date  --   02/03/2019      Precautions   Precautions  None      Restrictions   Weight Bearing Restrictions  No      Balance Screen   Has the patient fallen in the past 6 months  Yes    How many times?  1    Has the patient had a decrease in activity level because of a fear of falling?   --   Yes   Is the patient reluctant to leave  their home because of a fear of falling?   --   No     [place] residence    Living Arrangements  Spouse/significant other    Type of Home  Other(Comment)   [place] to enter   1 flight     Prior Function   Level of Independence  Independent      Cognition   Overall Cognitive Status  Within Functional Limits for tasks assessed      Observation/Other Assessments   Skin Integrity  --      Sensation   Light Touch  Appears Intact      Coordination   Gross Motor Movements are Fluid and Coordinated  Yes   Grossly WNLs     Posture/Postural Control   Posture/Postural Control  Postural limitations    Postural Limitations  Forward head;Increased thoracic kyphosis      ROM / Strength   AROM / PROM / Strength  AROM   Cervical ext= 20d and limited by pain     AROM   Overall AROM   --   cervical flex,L and R sidebending and rotation WNLs c pain     Flexibility   Soft Tissue Assessment /Muscle Length  --      Palpation   Palpation comment  --   Increased tightness of upper traps     Special Tests    Special Tests  --   Upper extremity myotome test was negative   Other special tests  --   Pt reported numbness of the R lat. arm 1-4 fingers               Objective measurements completed on examination: See above findings.      [place] Adult PT Treatment/Exercise - 05/10/19 0001      Neck Exercises: Seated   Cervical Rotation  --   L cervical rotation 10 reps     Manual Therapy   Manual therapy comments  --   Manual axial traction 5 mins,10 sec on/5 sec off            PT Education -  05/09/19 2210    Education Details  HEP and recommendations re: maintaining proper posture with assist from lumbar support is sitting and driving, and for sleeping postures while supine and SL.    Person(s) Educated  Patient    Methods  Explanation;Demonstration;Tactile cues;Verbal cues;Handout    Comprehension  Verbalized understanding;Returned demonstration       PT Short Term Goals - 05/10/19 0151      PT SHORT TERM GOAL #1   Title  Pt will be Ind c an initial HEP to address pain and function of the neck, R arm and jaw.    Baseline  No previous HEP    Time  3    Period  Weeks    Status  New    Target Date  05/31/19        PT Long Term Goals - 05/10/19 0202      PT LONG TERM GOAL #1   Title  Pt will be Ind c a final HEP to improve neck, R arm, and jaw pain and function.    Baseline  No current HEP    Time  5    Period  Weeks    Status  New    Target Date  06/14/19      PT LONG TERM GOAL #2  Title  Pt will demonstrate understanding of postures and activities which have positive and negative affects on her pain.    Baseline  Decrease understanding    Time  5    Period  Weeks    Status  New    Target Date  06/14/19      PT LONG TERM GOAL #3   Title  Pt will report a pain range of 0-3/10 with functional activities    Baseline  5-10/10    Time  5    Period  Weeks    Status  New    Target Date  06/14/19      PT LONG TERM GOAL #4   Title  Pt will demonstrate centralization of R arm pain from the hand to the base of he cervical spine.    Baseline  R arm pain extends to the hand    Time  5    Period  Weeks    Status  New    Target Date  06/14/19      PT LONG TERM GOAL #5   Title  Cervical ext ROM will improve to 50d to allow pt to complete overhead activities for ADLs.    Baseline  20d    Time  5    Period  Weeks    Status  New    Target Date  06/14/19             Plan - 05/09/19 2243    Clinical Impression Statement  Pt presents with neck and R arm  pain and numbness radiating to R hand; and jaw pain. Pt reports her neck and arm symptoms are her greatest concern. Except L cervical rotation all cervical movements were painful with extension being the greatest. Pt's R arm pain and numbness was not able to to be centralized. Manual traction provided temporary decrease in cervical pain as applied. Pt presents with signs and symptoms consistent with cervical radiculopathy. Pt will benefit from PT 2w4 to address cervical, R arm and jaw pain; ROM, and to improve pt's ability to tolerate and participate in ADLs.    Personal Factors and Comorbidities  Comorbidity 2    Comorbidities  Cervical and R arm pain and numbness; anxiety    Examination-Activity Limitations  Bed Mobility;Bathing;Lift;Bend;Carry;Dressing;Sleep;Reach Overhead;Stand;Sit;Transfers    Examination-Participation Restrictions  Laundry;Community Activity;Yard Work;Shop    Stability/Clinical Decision Making  Evolving/Moderate complexity    Clinical Decision Making  Moderate    Rehab Potential  Good    PT Frequency  2x / week    PT Duration  4 weeks    PT Treatment/Interventions  ADLs/Self Care Home Management;Electrical Stimulation;Iontophoresis 4mg /ml Dexamethasone;Traction;Moist Heat;Ultrasound;Gait training;Functional mobility training;Balance training;Therapeutic exercise;Therapeutic activities;Patient/family education;Manual techniques;Passive range of motion;Dry needling;Taping;Energy conservation    PT Next Visit Plan  Assess pt's response to HEP and posture instructions. Assess pt's response to directional cervical movements for reduction/centralization of pain. Utilize traction and STM as indicated.    PT Home Exercise Plan  [id number]    Consulted and Agree with Plan of Care  Patient       Patient will benefit from skilled therapeutic intervention in order to improve the following deficits and impairments:  Improper body mechanics, Pain, Decreased mobility, Decreased activity  tolerance, Decreased range of motion, Impaired UE functional use, Decreased balance  Visit Diagnosis: Cervicalgia  Abnormal posture  Stiffness of cervical spine     Problem List Patient Active Problem List   Diagnosis Date Noted  . Spondylosis, cervical, with  myelopathy 03/17/2019  . 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 Low back pain 03/17/2019  . Non-suppurative otitis media 03/17/2019  . Injury of jawline 03/17/2019  . Disorder of oral soft tissue 03/17/2019  . Reactive depression 06/13/2017  . Chronic pain syndrome 06/13/2017  . Polypharmacy 06/13/2017  . Empty sella syndrome ([place]) 05/01/2017  . Adjustment disorder with mixed anxiety and depressed mood 10/13/2016  . 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 Hypokalemia 02/22/2016  . Constipation, chronic 02/22/2016  . Memory loss due to medical condition 12/14/2015  . Chronic fatigue 12/14/2015  . Recurrent dehydration 09/30/2015  . Chronic nausea 08/23/2015  . Abdominal pain, chronic, epigastric 08/20/2015  . Protein-calorie malnutrition, moderate ([place]) 08/19/2015  . Mild obstructive sleep apnea 07/08/2015  . Dyslipidemia 07/08/2015  . S/P gastric bypass 07/08/2015  . IIH (idiopathic intracranial hypertension) 06/03/2015  . Vision changes 04/30/2015  . Perceived hearing changes 04/30/2015  . Chronic daily headache 04/21/2014  . Liver lesion 03/21/2013  . Splenic cyst 03/21/2013    Gar Ponto MS, PT 05/10/2019, 1:43 PM  [facility] [address] [place], [place], [address] Phone: [phone]   Fax:  [phone]  Name: Maleeyah Spritzer MRN: [id number] Date of Birth: Aug 08, 1970

## 2019-05-12 ENCOUNTER — Ambulatory Visit (INDEPENDENT_AMBULATORY_CARE_PROVIDER_SITE_OTHER): Payer: Medicare HMO

## 2019-05-12 DIAGNOSIS — Z1159 Encounter for screening for other viral diseases: Secondary | ICD-10-CM

## 2019-05-16 ENCOUNTER — Ambulatory Visit (INDEPENDENT_AMBULATORY_CARE_PROVIDER_SITE_OTHER): Payer: Medicare HMO

## 2019-05-16 ENCOUNTER — Other Ambulatory Visit: Payer: Self-pay | Admitting: Internal Medicine

## 2019-05-16 ENCOUNTER — Encounter: Payer: Medicare HMO | Admitting: Internal Medicine

## 2019-05-16 DIAGNOSIS — Z1159 Encounter for screening for other viral diseases: Secondary | ICD-10-CM | POA: Diagnosis not present

## 2019-05-17 ENCOUNTER — Ambulatory Visit (HOSPITAL_BASED_OUTPATIENT_CLINIC_OR_DEPARTMENT_OTHER)
Admission: RE | Admit: 2019-05-17 | Discharge: 2019-05-17 | Disposition: A | Discharge status: Home / Self Care | Payer: Medicare HMO | Source: Ambulatory Visit | Attending: Internal Medicine | Admitting: Internal Medicine

## 2019-05-17 ENCOUNTER — Other Ambulatory Visit: Payer: Self-pay

## 2019-05-17 ENCOUNTER — Other Ambulatory Visit: Payer: Self-pay | Admitting: Internal Medicine

## 2019-05-17 ENCOUNTER — Telehealth: Payer: Self-pay | Admitting: Internal Medicine

## 2019-05-17 ENCOUNTER — Ambulatory Visit (INDEPENDENT_AMBULATORY_CARE_PROVIDER_SITE_OTHER)
Admission: RE | Admit: 2019-05-17 | Discharge: 2019-05-17 | Disposition: A | Discharge status: Home / Self Care | Payer: Medicare HMO | Source: Ambulatory Visit | Attending: Internal Medicine | Admitting: Internal Medicine

## 2019-05-17 DIAGNOSIS — R140 Abdominal distension (gaseous): Secondary | ICD-10-CM

## 2019-05-17 DIAGNOSIS — K5909 Other constipation: Secondary | ICD-10-CM

## 2019-05-17 DIAGNOSIS — K56609 Unspecified intestinal obstruction, unspecified as to partial versus complete obstruction: Secondary | ICD-10-CM | POA: Diagnosis not present

## 2019-05-17 DIAGNOSIS — R109 Unspecified abdominal pain: Secondary | ICD-10-CM | POA: Diagnosis not present

## 2019-05-17 LAB — SARS CORONAVIRUS 2 (TAT 6-24 HRS): SARS Coronavirus 2: NEGATIVE

## 2019-05-17 MED ORDER — IOHEXOL 300 MG/ML  SOLN
100.0000 mL | Freq: Once | INTRAMUSCULAR | Status: AC | PRN
  Administered 2019-05-17: 100 mL via INTRAVENOUS

## 2019-05-17 NOTE — Telephone Encounter (Signed)
Pt scheduled for CT of A/P at [facility] this evening, pt aware and knows to proceed to [place]. Dr. Carlean Purl aware.

## 2019-05-17 NOTE — Telephone Encounter (Signed)
Pt is scheduled for a colonoscopy tomorrow.  She stated that she is taking the two-day prep not has not had a BM.

## 2019-05-17 NOTE — Telephone Encounter (Signed)
Called patient back and encouraged her to come in to get an abdominal xray per MD order. She understood and agreed to come in by 3:00 pm. MD aware. Will notify after film is taken.

## 2019-05-17 NOTE — Telephone Encounter (Signed)
Would use bowel obstruction as diagnosis

## 2019-05-17 NOTE — Telephone Encounter (Signed)
Called patient back and she states that she has had a total of 2 bottles of miralax and 4 ducolax tabs yesterday and still no BM. She attached a video that shows how distended she is. She is scheduled for tomorrow. Please advise.

## 2019-05-17 NOTE — Telephone Encounter (Signed)
I ordered an abdominal film  Need her to come do that here at [facility]  She needs to do that by 3 PM please   I will look at the results and we can advise next steps  I am off this PM but available by text/page and will look for the result.   If she has severe pain, vomiting, etc then needs to go to ED

## 2019-05-17 NOTE — Telephone Encounter (Signed)
Patient has possible bowel obstruction vs ileus on abd xray after day 1 of 2 day colon prep  Reviewed with radiologist and they suggest CT scan abd/pelvis   She is unconformtable but not in severe distress   She needs:  1) CT abd/pelvis with contrast today/tonight please, or at least by AM  2) cancel colonoscopy for tomorrow - no more prep (I told her)  3) If we cannot get a CT soon as indicated she needs to go to ED - I suggested [facility]- we are trying to avoid ED unless there is no other way or she gets to the point symptoms warrany

## 2019-05-18 ENCOUNTER — Encounter (HOSPITAL_COMMUNITY): Payer: Self-pay

## 2019-05-18 ENCOUNTER — Other Ambulatory Visit: Payer: Self-pay

## 2019-05-18 ENCOUNTER — Encounter: Payer: Medicare HMO | Admitting: Internal Medicine

## 2019-05-18 ENCOUNTER — Emergency Department (HOSPITAL_COMMUNITY): Payer: Medicare HMO

## 2019-05-18 ENCOUNTER — Emergency Department (HOSPITAL_COMMUNITY)
Admission: EM | Admit: 2019-05-18 | Discharge: 2019-05-18 | Disposition: A | Discharge status: Home / Self Care | Payer: Medicare HMO | Attending: Emergency Medicine | Admitting: Emergency Medicine

## 2019-05-18 ENCOUNTER — Telehealth: Payer: Self-pay | Admitting: Internal Medicine

## 2019-05-18 DIAGNOSIS — I10 Essential (primary) hypertension: Secondary | ICD-10-CM | POA: Diagnosis not present

## 2019-05-18 DIAGNOSIS — R1084 Generalized abdominal pain: Secondary | ICD-10-CM | POA: Diagnosis present

## 2019-05-18 DIAGNOSIS — R197 Diarrhea, unspecified: Secondary | ICD-10-CM | POA: Diagnosis not present

## 2019-05-18 DIAGNOSIS — Z79899 Other long term (current) drug therapy: Secondary | ICD-10-CM | POA: Insufficient documentation

## 2019-05-18 DIAGNOSIS — K5900 Constipation, unspecified: Secondary | ICD-10-CM

## 2019-05-18 LAB — URINALYSIS, ROUTINE W REFLEX MICROSCOPIC
Bilirubin Urine: NEGATIVE
Glucose, UA: NEGATIVE mg/dL
Hgb urine dipstick: NEGATIVE
Ketones, ur: NEGATIVE mg/dL
Leukocytes,Ua: NEGATIVE
Nitrite: NEGATIVE
Protein, ur: NEGATIVE mg/dL
Specific Gravity, Urine: 1.021 (ref 1.005–1.030)
pH: 7 (ref 5.0–8.0)

## 2019-05-18 LAB — PREGNANCY, URINE: Preg Test, Ur: NEGATIVE

## 2019-05-18 LAB — COMPREHENSIVE METABOLIC PANEL
ALT: 24 U/L (ref 0–44)
AST: 36 U/L (ref 15–41)
Albumin: 3.7 g/dL (ref 3.5–5.0)
Alkaline Phosphatase: 95 U/L (ref 38–126)
Anion gap: 8 (ref 5–15)
BUN: 19 mg/dL (ref 6–20)
CO2: 27 mmol/L (ref 22–32)
Calcium: 8.3 mg/dL — ABNORMAL LOW (ref 8.9–10.3)
Chloride: 100 mmol/L (ref 98–111)
Creatinine, Ser: 1.44 mg/dL — ABNORMAL HIGH (ref 0.44–1.00)
GFR calc Af Amer: 49 mL/min — ABNORMAL LOW (ref >60)
GFR calc non Af Amer: 43 mL/min — ABNORMAL LOW (ref >60)
Glucose, Bld: 95 mg/dL (ref 70–99)
Potassium: 3.6 mmol/L (ref 3.5–5.1)
Sodium: 135 mmol/L (ref 135–145)
Total Bilirubin: 0.5 mg/dL (ref 0.3–1.2)
Total Protein: 6.8 g/dL (ref 6.5–8.1)

## 2019-05-18 LAB — CBC
HCT: 36.8 % (ref 36.0–46.0)
Hemoglobin: 11.2 g/dL — ABNORMAL LOW (ref 12.0–15.0)
MCH: 30.1 pg (ref 26.0–34.0)
MCHC: 30.4 g/dL (ref 30.0–36.0)
MCV: 98.9 fL (ref 80.0–100.0)
Platelets: 278 10*3/uL (ref 150–400)
RBC: 3.72 MIL/uL — ABNORMAL LOW (ref 3.87–5.11)
RDW: 12.3 % (ref 11.5–15.5)
WBC: 7 10*3/uL (ref 4.0–10.5)
nRBC: 0 % (ref 0.0–0.2)

## 2019-05-18 LAB — LIPASE, BLOOD: Lipase: 26 U/L (ref 11–51)

## 2019-05-18 MED ORDER — MORPHINE SULFATE (PF) 4 MG/ML IV SOLN
4.0000 mg | Freq: Once | INTRAVENOUS | Status: AC
  Administered 2019-05-18: 18:00:00 4 mg via INTRAVENOUS
  Filled 2019-05-18: qty 1

## 2019-05-18 MED ORDER — SORBITOL 70 % SOLN
960.0000 mL | TOPICAL_OIL | Freq: Once | ORAL | Status: AC
  Administered 2019-05-18: 960 mL via RECTAL
  Filled 2019-05-18: qty 473

## 2019-05-18 MED ORDER — SODIUM CHLORIDE 0.9 % IV BOLUS
500.0000 mL | Freq: Once | INTRAVENOUS | Status: AC
  Administered 2019-05-18: 500 mL via INTRAVENOUS

## 2019-05-18 MED ORDER — PROMETHAZINE HCL 25 MG/ML IJ SOLN
12.5000 mg | Freq: Once | INTRAMUSCULAR | Status: AC
  Administered 2019-05-18: 19:00:00 12.5 mg via INTRAVENOUS
  Filled 2019-05-18: qty 1

## 2019-05-18 MED ORDER — ONDANSETRON HCL 4 MG/2ML IJ SOLN
4.0000 mg | Freq: Once | INTRAMUSCULAR | Status: AC
  Administered 2019-05-18: 18:00:00 4 mg via INTRAVENOUS
  Filled 2019-05-18: qty 2

## 2019-05-18 MED ORDER — SODIUM CHLORIDE 0.9% FLUSH
3.0000 mL | Freq: Once | INTRAVENOUS | Status: AC
  Administered 2019-05-18: 3 mL via INTRAVENOUS

## 2019-05-18 NOTE — ED Notes (Signed)
Labeled urine specimen and culture sent to lab. ENMiles 

## 2019-05-18 NOTE — Discharge Instructions (Addendum)
You have been diagnosed today with Constipation.  At this time there does not appear to be the presence of an emergent medical condition, however there is always the potential for conditions to change. Please read and follow the below instructions.  Please return to the Emergency Department immediately for any new or worsening symptoms. Please be sure to follow up with your Primary Care Provider within one week regarding your visit today; please call their office to schedule an appointment even if you are feeling better for a follow-up visit. Please drink plenty of water and get plenty of rest.  Please use MiraLAX as directed on the packaging to help with your symptoms.  Please call your gastroenterologist tomorrow morning to schedule a follow-up visit.  Get help right away if: You have a fever, and your symptoms suddenly get worse. You leak poop or have blood in your poop. Your belly feels hard or bigger than normal (is bloated). You have very bad belly pain. You feel dizzy or you faint. You have any new/concerning or worsening of symptoms  Please read the additional information packets attached to your discharge summary.  Do not take your medicine if  develop an itchy rash, swelling in your mouth or lips, or difficulty breathing; call 911 and seek immediate emergency medical attention if this occurs.  Note: Portions of this text may have been transcribed using voice recognition software. Every effort was made to ensure accuracy; however, inadvertent computerized transcription errors may still be present.

## 2019-05-18 NOTE — ED Notes (Signed)
Chaperon for disimpaction.

## 2019-05-18 NOTE — ED Notes (Signed)
Patient sitting on bedside commode. Patient had another occurrence of diarrhea. Patient now nauseated from straining.  Erlene Quan, [place] made aware.

## 2019-05-18 NOTE — ED Notes (Signed)
SMOG attempted 3x. Each time patient had same output as input with some diarrhea. Patient tolerated well.

## 2019-05-18 NOTE — Telephone Encounter (Signed)
Patient tried another enema but no different - no sig stool out Still distended, in some mild pain but bearable  Given lack of stool w/ colon prep and 2 enemas and CT contrast I recommended that she go to ED and I think she will   When there she may need disimpaction, consideration for a SMOG enema vs therapeutic gastrograffen enema from radiology  I will be available for advice if needed  Goal is to relieve distal colon/rectal obstruction from stool  Gatha Mayer, MD, [facility] 05/18/2019 12:35 PM  [phone]

## 2019-05-18 NOTE — ED Triage Notes (Signed)
Patient states she was suppose to have a repeat colonoscopy today. Patient states she did a full prep with no BM. Patient went to [place] for a CT scan. Patient was sent for possible bowel obstruction. Patient also gave herself a small enema today and did have a small amount of stool.  Patient also has edema of bilateral lower extremities.

## 2019-05-18 NOTE — ED Provider Notes (Signed)
[facility] DEPT Provider Note   CSN: [id number] Arrival date & time: 05/18/19  1512     History Chief Complaint  Patient presents with  . bowel obstruction  . Abdominal Pain  . Bloated    Margaret Fletcher is a 49 y.o. female history of alopecia, hypertension, age, migraines, chronic constipation.  Patient presented today for concern of fecal impaction.  She reports that she had CT abdomen pelvis performed at her gastroenterologist office yesterday, she reports that she has had ongoing abdominal pain and constipation for several weeks which is being followed by gastroenterology.  She reports symptoms worse today and she was sent to the ER for evaluation.  She describes generalized abdominal pain moderate intensity cramping sensation constant worsened with bowel movements improved with rest.  She reports only small amount of stool yesterday, nonbloody, no melena.  She has attempted MiraLAX and enema at home with minimal relief.  She denies fever/chills, headache, chest pain/shortness of breath, dysuria/hematuria, saddle or paresthesias, bowel/bladder incontinence, urinary retention, fall/injury and additional concerns.  HPI     Past Medical History:  Diagnosis Date  . Adie's pupil, left   . Alopecia   . Anxiety   . Autoimmune disease ([place])    "Alopecia"  . Bacterial vaginosis   . Cyst of spleen   . Depression   . Disorder of oral soft tissue 03/17/2019  . Frequent UTI   . Hypertension   . Hypertrophy of breast   . IIH (idiopathic intracranial hypertension) 2017   "related to headaches"  . Injury of jawline 03/17/2019  . Migraine headache    denies, ruled out - Propranolol.Using for headaches  . Non-suppurative otitis media 03/17/2019    Patient Active Problem List   Diagnosis Date Noted  . Spondylosis, cervical, with myelopathy 03/17/2019  . 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 Low back pain 03/17/2019  .  Non-suppurative otitis media 03/17/2019  . Injury of jawline 03/17/2019  . Disorder of oral soft tissue 03/17/2019  . Reactive depression 06/13/2017  . Chronic pain syndrome 06/13/2017  . Polypharmacy 06/13/2017  . Empty sella syndrome ([place]) 05/01/2017  . Adjustment disorder with mixed anxiety and depressed mood 10/13/2016  . 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 Hypokalemia 02/22/2016  . Constipation, chronic 02/22/2016  . Memory loss due to medical condition 12/14/2015  . Chronic fatigue 12/14/2015  . Recurrent dehydration 09/30/2015  . Chronic nausea 08/23/2015  . Abdominal pain, chronic, epigastric 08/20/2015  . Protein-calorie malnutrition, moderate ([place]) 08/19/2015  . Mild obstructive sleep apnea 07/08/2015  . Dyslipidemia 07/08/2015  . S/P gastric bypass 07/08/2015  . IIH (idiopathic intracranial hypertension) 06/03/2015  . Vision changes 04/30/2015  . Perceived hearing changes 04/30/2015  . Chronic daily headache 04/21/2014  . Liver lesion 03/21/2013  . Splenic cyst 03/21/2013    Past Surgical History:  Procedure Laterality Date  . APPENDECTOMY    . BREAST EXCISIONAL BIOPSY Right 2017   removed at time of reduction  . BREAST REDUCTION SURGERY Bilateral 03/26/2015   Procedure: BILATERAL BREAST REDUCTION  ;  Surgeon: Youlanda Roys, MD;  Location: [facility];  Service: Plastics;  Laterality: Bilateral;  . BREATH TEK H PYLORI N/A 05/21/2014   Procedure: BREATH TEK H PYLORI;  Surgeon: Greer Pickerel, MD;  Location: [facility];  Service: General;  Laterality: N/A;  . CELIAC PLEXUS BLOCK  07/18/2018   Per patient it was done at [facility]    .  CESAREAN SECTION    . CHOLECYSTECTOMY    . DILITATION & CURRETTAGE/HYSTROSCOPY WITH NOVASURE ABLATION N/A 07/19/2012   Procedure: HYSTEROSCOPY WITH NOVASURE ABLATION;  Surgeon: Farrel Gobble. Harrington Challenger, MD;  Location: [facility];  Service: Gynecology;  Laterality: N/A;  . ENDOMETRIAL ABLATION    .  ESOPHAGOGASTRODUODENOSCOPY N/A 08/16/2015   Procedure: UPPER ESOPHAGOGASTRODUODENOSCOPY (EGD);  Surgeon: Greer Pickerel, MD;  Location: [facility];  Service: General;  Laterality: N/A;  . ESOPHAGOGASTRODUODENOSCOPY N/A 02/21/2016   Procedure: ESOPHAGOGASTRODUODENOSCOPY (EGD);  Surgeon: Greer Pickerel, MD;  Location: [facility];  Service: General;  Laterality: N/A;  . ESOPHAGOGASTRODUODENOSCOPY N/A 02/24/2017   Procedure: ESOPHAGOGASTRODUODENOSCOPY (EGD);  Surgeon: Greer Pickerel, MD;  Location: [facility];  Service: General;  Laterality: N/A;  . IR GENERIC HISTORICAL  10/04/2015   IR [place] Vivianne Master 10/04/2015 Markus Daft, MD [facility]  . IR GENERIC HISTORICAL  01/31/2016   IR [place] GASTRO/COLONIC TUBE PERCUT W/FLUORO 01/31/2016 Corrie Mckusick, DO [facility]  . IR GENERIC HISTORICAL  04/06/2016   IR [place] TUBE PERCUT W/FLUORO 04/06/2016 Arne Cleveland, MD [facility]  . IR GENERIC HISTORICAL  05/14/2016   IR GASTRIC TUBE PERC CHG W/O IMG GUIDE 05/14/2016 Sandi Mariscal, MD [facility]  . LAPAROSCOPIC GASTROSTOMY N/A 09/30/2015   Procedure: LAPAROSCOPIC GASTROSTOMY TUBE PLACEMENT;  Surgeon: Greer Pickerel, MD;  Location: [facility];  Service: General;  Laterality: N/A;  . LAPAROSCOPIC ROUX-EN-Y GASTRIC BYPASS WITH HIATAL HERNIA REPAIR N/A 07/08/2015   Procedure: LAPAROSCOPIC ROUX-EN-Y GASTRIC BYPASS WITH HIATAL HERNIA REPAIR WITH UPPER ENDOSCOPY;  Surgeon: Greer Pickerel, MD;  Location: [facility];  Service: General;  Laterality: N/A;  . LAPAROSCOPIC SMALL BOWEL RESECTION N/A 09/30/2015   Procedure: LAPAROSCOPIC REVISION OF ROUX LIMB;  Surgeon: Greer Pickerel, MD;  Location: [facility];  Service: General;  Laterality: N/A;  . LAPAROSCOPY N/A 09/30/2015   Procedure: LAPAROSCOPY DIAGNOSTIC;  Surgeon: Greer Pickerel, MD;  Location: [facility];  Service: General;  Laterality: N/A;  . REDUCTION MAMMAPLASTY Bilateral 2017  . TUBAL LIGATION    . WISDOM TOOTH EXTRACTION       OB History   No  obstetric history on file.     Family History  Problem Relation Age of Onset  . Cancer Mother        late 29'-50  . Breast cancer Mother        late 57'-50  . Stroke Father   . Colon polyps Father   . Diabetes Brother   . Seizures Son   . Breast cancer Maternal Aunt        late 40'-50  . Breast cancer Maternal Aunt        late 40'-50  . Breast cancer Maternal Aunt        late 40'-50  . Migraines Neg Hx     Social History   Tobacco Use  . Smoking status: Never Smoker  . Smokeless tobacco: Never Used  Substance Use Topics  . Alcohol use: Not Currently  . Drug use: No    Home Medications Prior to Admission medications   Medication Sig Start Date End Date Taking? Authorizing Provider  albuterol (PROVENTIL HFA;VENTOLIN HFA) 108 (90 Base) MCG/ACT inhaler Inhale 2 puffs into the lungs every 6 (six) hours as needed for wheezing or shortness of breath. 01/25/18   Shawnee Knapp, MD  amitriptyline (ELAVIL) 10 MG tablet TAKE 2 TABLETS BY MOUTH AT BEDTIME. 10/11/18   Rutherford Guys, MD  azelastine (ASTELIN) 0.1 % nasal spray Place 1  spray into both nostrils 2 (two) times daily. Use in each nostril as directed 04/18/19   Maximiano Coss, NP  BAYER MICROLET LANCETS lancets Use as instructed 06/13/17   Shawnee Knapp, MD  BELBUCA 450 MCG FILM Take by mouth 2 (two) times daily. 01/09/19   [provider]  Buprenorphine HCl 300 MCG FILM Place 450 mcg inside cheek every 12 (twelve) hours. Can also take for breakthrough pain per patient     [provider]  busPIRone (BUSPAR) 5 MG tablet Take 1 tablet (5 mg total) by mouth 3 (three) times daily. 02/28/19   Rutherford Guys, MD  CALCIUM-VITAMIN D PO Take 1 tablet by mouth 2 (two) times daily. chewable    [provider]  cetirizine (ZYRTEC) 10 MG tablet Take 1 tablet (10 mg total) by mouth daily. 04/18/19   Maximiano Coss, NP  chlorhexidine (PERIDEX) 0.12 % solution Use as directed 15 mLs in the mouth or throat 2 (two) times  daily. 03/17/19   Wendall Mola, NP  Cyanocobalamin (VITAMIN B-12 SL) Place 1 drop under the tongue daily. 1 dropperful    [provider]  cyclobenzaprine (FLEXERIL) 5 MG tablet TAKE 1 TABLET BY MOUTH 3 TIMES DAILY AS NEEDED FOR MUSCLE SPASMS. MAY TAKE 2 TABS BY MOUTH AT BEDTIME 06/17/18   Rutherford Guys, MD  desvenlafaxine (PRISTIQ) 100 MG 24 hr tablet TAKE 1 TABLET BY MOUTH ONCE A DAY 03/20/19   Rutherford Guys, MD  docusate sodium (COLACE) 100 MG capsule Take 100 mg by mouth daily as needed.     [provider]  eletriptan (RELPAX) 40 MG tablet Take 1 tablet (40 mg total) by mouth every 2 (two) hours as needed for migraine or headache. Do not use >2 doses/24 hours 01/09/19   Lomax, Amy, NP  famotidine (PEPCID) 20 MG tablet Take 1 tablet (20 mg total) by mouth 2 (two) times daily. 02/28/19   Rutherford Guys, MD  fluticasone (FLONASE) 50 MCG/ACT nasal spray Place 2 sprays into both nostrils daily. 04/18/19   Maximiano Coss, NP  glucose blood (CONTOUR NEXT TEST) test strip 1 each by Other route 4 (four) times daily as needed (hypoglycemia). K91.2 06/10/17   Shawnee Knapp, MD  hydrocortisone (ANUSOL-HC) 25 MG suppository Place 1 suppository (25 mg total) rectally at bedtime. Use for 5 nights 03/23/19   Noralyn Pick, NP  mometasone (ELOCON) 0.1 % lotion Apply 3 drops in affected ear(s) three times daily for 2-4 days until itching is resolved. Repeat as needed. 04/10/19   [provider]  Multiple Vitamins-Minerals (BARIATRIC MULTIVITAMINS/IRON PO) Take 1 tablet by mouth 3 (three) times daily.    [provider]  naloxegol oxalate (MOVANTIK) 25 MG TABS tablet Take 1 tablet (25 mg total) by mouth daily. 04/19/19   Gatha Mayer, MD  Omega-3 Fatty Acids (OMEGA-3 FISH OIL PO) Take by mouth daily.    [provider]  omeprazole (PRILOSEC) 20 MG capsule Take 1 capsule (20 mg total) by mouth every morning. 03/23/19   Noralyn Pick, NP    ondansetron (ZOFRAN ODT) 4 MG disintegrating tablet Take 1 tablet (4 mg total) by mouth every 8 (eight) hours as needed for nausea or vomiting. 01/06/19   Maximiano Coss, NP  OVER THE COUNTER MEDICATION Meca root with xulane patch for hormone and premenopausal sx (wears all week).    [provider]  OVER THE COUNTER MEDICATION Calcium, magnesium, zinc with D3 (one tablet daily)  [provider]  OVER THE COUNTER MEDICATION Collagen and vitamin c and biotin (takes one daily)    [provider]  [place] for muscle and joint health    [provider]  polyethylene glycol (MIRALAX / GLYCOLAX) packet Take 17 g by mouth daily as needed (for constipation.).     [provider]  PREMARIN vaginal cream  11/28/18   [provider]  simethicone (MYLICON) 80 MG chewable tablet Chew 80 mg by mouth every 6 (six) hours as needed for flatulence.    [provider]  topiramate (TOPAMAX) 100 MG tablet Take 1.5 tablets (150 mg total) by mouth daily. 01/09/19   Lomax, Amy, NP  triamterene-hydrochlorothiazide (MAXZIDE-25) 37.5-25 MG tablet Take 1 tablet by mouth daily. 02/28/19   Rutherford Guys, MD    Allergies    Sulfa antibiotics, Metoclopramide, and Zosyn [piperacillin sod-tazobactam so]  Review of Systems   Review of Systems Ten systems are reviewed and are negative for acute change except as noted in the HPI  Physical Exam Updated Vital Signs BP 120/90 (BP Location: Left Arm)   Pulse 88   Temp 98.2 F (36.8 C) (Oral)   Resp 18   Ht 5\' 8"  (1.727 m)   Wt 86.2 kg   SpO2 100%   BMI 28.89 kg/m   Physical Exam Constitutional:      General: She is not in acute distress.    Appearance: Normal appearance. She is well-developed. She is not ill-appearing or diaphoretic.  HENT:     Head: Normocephalic and atraumatic.     Right Ear: External ear normal.     Left Ear: External ear normal.     Nose: Nose normal.   Eyes:     General: Vision grossly intact. Gaze aligned appropriately.     Pupils: Pupils are equal, round, and reactive to light.  Neck:     Trachea: Trachea and phonation normal. No tracheal deviation.  Pulmonary:     Effort: Pulmonary effort is normal. No respiratory distress.  Abdominal:     General: There is no distension.     Palpations: Abdomen is soft.     Tenderness: There is generalized abdominal tenderness (Minimal). There is no guarding or rebound.  Musculoskeletal:        General: Normal range of motion.     Cervical back: Normal range of motion.  Skin:    General: Skin is warm and dry.  Neurological:     Mental Status: She is alert.     GCS: GCS eye subscore is 4. GCS verbal subscore is 5. GCS motor subscore is 6.     Comments: Speech is clear and goal oriented, follows commands Major Cranial nerves without deficit, no facial droop Moves extremities without ataxia, coordination intact  Psychiatric:        Behavior: Behavior normal.     ED Results / Procedures / Treatments   Labs (all labs ordered are listed, but only abnormal results are displayed) Labs Reviewed  LIPASE, BLOOD  COMPREHENSIVE METABOLIC PANEL  CBC  URINALYSIS, ROUTINE W REFLEX MICROSCOPIC    EKG None  Radiology CT Abdomen Pelvis W Contrast  Result Date: 05/17/2019 CLINICAL DATA:  Abdominal pain and bloating EXAM: CT ABDOMEN AND PELVIS WITH CONTRAST TECHNIQUE: Multidetector CT imaging of the abdomen and pelvis was performed using the standard protocol following bolus administration of intravenous contrast. CONTRAST:  181mL OMNIPAQUE IOHEXOL 300 MG/ML  SOLN COMPARISON:  12/31/2017 FINDINGS: Lower chest:  No acute abnormality. Hepatobiliary: No focal liver abnormality is seen. Status post cholecystectomy. No biliary dilatation. Pancreas: Unremarkable. No pancreatic ductal dilatation or surrounding inflammatory changes. Spleen: Normal in size without focal abnormality. Adrenals/Urinary Tract:  Adrenal glands are within normal limits. Kidneys demonstrate a normal enhancement pattern bilaterally. Normal excretion of contrast is seen bilaterally. The bladder is partially decompressed. Stomach/Bowel: Colon is filled with fluid and fecal material and mildly distended throughout its course. The appendix is not well visualized. Contrast material within the small bowel. Changes of prior gastric bypass are noted. Previous gastrostomy site is seen although no catheter is noted. Vascular/Lymphatic: No significant vascular findings are present. No enlarged abdominal or pelvic lymph nodes. Reproductive: Uterus is bulky with fibroid change. The largest of these measures approximately 5.6 cm. This is stable from the prior exam. Other: No abdominal wall hernia or abnormality. No abdominopelvic ascites. Musculoskeletal: No acute or significant osseous findings. IMPRESSION: Diffuse colonic dilatation with fluid and fecal material within which may represent a generalized colonic ileus. No obstructive lesion is seen. Chronic changes as described above similar to that seen on the prior study. Electronically Signed   By: Inez Catalina M.D.   On: 05/17/2019 20:43   DG Abd 2 Views  Result Date: 05/17/2019 CLINICAL DATA:  Abdominal distension, gas and bloating during colonoscopy prep. EXAM: ABDOMEN - 2 VIEW COMPARISON:  12/31/2017 FINDINGS: Upright view with multiple air-fluid levels in both large and small bowel. No signs of free air beneath either right or left hemidiaphragm. Supine radiograph with some dilated loops of small bowel, predominantly gas-filled loops of colon are visualized the level of the descending colon. Abundant stool is present in the rectum and likely distal sigmoid colon. Visualized skeletal structures with signs of spinal degenerative change. Signs of cholecystectomy. IMPRESSION: 1. Multiple air-fluid levels in both large and small bowel suggesting ileus or distal colonic obstruction. CT may be helpful  for further evaluation. 2. Abundant stool in the rectum and likely distal sigmoid colon. These results were discussed by telephone at the time of interpretation on 05/17/2019 at 3:55 pm to provider Silvano Rusk , who verbally acknowledged these results. Electronically Signed   By: Zetta Bills M.D.   On: 05/17/2019 15:55    Procedures Fecal disimpaction  Date/Time: 05/18/2019 10:09 PM Performed by: Deliah Boston, PA-C Authorized by: Deliah Boston, PA-C  Consent: Verbal consent obtained. Risks and benefits: risks, benefits and alternatives were discussed Consent given by: patient Patient understanding: patient states understanding of the procedure being performed Patient consent: the patient's understanding of the procedure matches consent given Test results: test results available and properly labeled Required items: required blood products, implants, devices, and special equipment available Patient identity confirmed: verbally with patient Time out: Immediately prior to procedure a "time out" was called to verify the correct patient, procedure, equipment, support staff and site/side marked as required. Local anesthesia used: no  Anesthesia: Local anesthesia used: no  Sedation: Patient sedated: no  Patient tolerance: patient tolerated the procedure well with no immediate complications Comments: Small amount of soft light brown stool. Procedure Chaperoned by Sharlot Gowda RN.    (including critical care time)  Medications Ordered in ED Medications  sodium chloride flush (NS) 0.9 % injection 3 mL (has no administration in time range)    ED Course  I have reviewed the triage vital signs and the nursing notes.  Pertinent labs & imaging results that were available during my care of the patient were reviewed by me and  considered in my medical decision making (see chart for details).  Clinical Course as of May 18 2214  Thu May 18, 2019  1630 [phone]   [BM]  1653 Maylon Peppers   [BM]  2023 Dr. Lyndel Safe   [BM]  2135 Dr. Lyndel Safe   [BM]    Clinical Course User Index [BM] Gari Crown   MDM Rules/Calculators/A&P                     49 year old female with history as detailed above presents today for concern of obstruction, she reports that she has had constipation and generalized abdominal pain ongoing for some time, she reports that she did CT scan yesterday with her gastroenterologist and is concerned for possible obstruction versus fecal impaction.  On exam she is well-appearing no acute distress, abdomen minimally tender without peritoneal signs.  She denies any cauda equina-like symptoms.  Denies any recent fever/chills or vomiting today.  Abdominal pain lab work ordered. - Chart review, patient had phone call advice from her gastroenterologist Dr. Carlean Purl today.  It appears he referred her to the ED for possible disimpaction versus smog enema versus therapeutic Gastrografin enema with goal to relieve distal colon/rectal obstruction from stool.  Attempted to call listed number, no voicemail.  CTAP: 05/17/19:  IMPRESSION: Diffuse colonic dilatation with fluid and fecal material within which may represent a generalized colonic ileus. No obstructive lesion is seen. Chronic changes as described above similar to that seen on the prior study.  - Lab work obtained today reassuring.  Pregnancy test negative.  Lipase within normal limits no evidence of pancreatitis.  Urinalysis without evidence of infection.  CMP shows mild elevation of creatinine 1.44 minimally elevated from prior at 1.35, and she was given fluid bolus, no emergent electrolyte derangement or elevation of LFTs.  CBC shows hemoglobin 11.2, no symptoms of anemia, no leukocytosis suggest infection  I discussed the case with Alonza Bogus PA-C from [facility] she advises to attempt smog enema and if unsuccessful disimpaction, following disimpaction obtain plain film x-ray of the abdomen prior to  discharge. - Patient wished to proceed first with the enema, this was given she reports that she had some diarrhea afterwards but does not feel this was completely successful.  She did develop some nausea after the enema which was relieved with Phenergan.  We then proceeded with attempted disimpaction, minimal amount of light brown stool was obtained.  Of note small nonthrombosed external hernia with seen.  Was unable to feel any stool balls present in the rectum today.  I then spoke with Dr. Lyndel Safe from gastroenterology who advised to view abdominal x-ray for evaluation of obstructive pattern prior to discharge. - DG abdomen 2 view:  IMPRESSION:  Nonspecific bowel gas pattern with resolution of the previously  demonstrated air-fluid levels.  - 9:35 PM: I rediscussed the case with Dr. Lyndel Safe he advises that patient may be discharged at this time with follow-up as outpatient with Dr. Arelia Longest, he is sending message to Dr. Arelia Longest for follow-up.  Possibility of starting patient on Motegrity at a follow-up visit. - Patient reevaluated resting comfortably no acute distress she is requesting discharge.  I advised her of discussions as above and she states understanding, she plans to call her gastroenterologist tomorrow morning to schedule follow-up visit.  Encouraged increased water intake, MiraLAX and high-fiber foods.  At this time there does not appear to be any evidence of an acute emergency medical condition and the patient appears stable  for discharge with appropriate outpatient follow up. Diagnosis was discussed with patient who verbalizes understanding of care plan and is agreeable to discharge. I have discussed return precautions with patient who verbalizes understanding of return precautions. Patient encouraged to follow-up with their PCP and GI. All questions answered.  Patient's case discussed with Dr. Sedonia Small who agrees with plan to discharge with follow-up.   Note: Portions of this report may  have been transcribed using voice recognition software. Every effort was made to ensure accuracy; however, inadvertent computerized transcription errors may still be present. Final Clinical Impression(s) / ED Diagnoses Final diagnoses:  Constipation, unspecified constipation type    Rx / DC Orders ED Discharge Orders    None       Gari Crown 05/18/19 2220    Maudie Flakes, MD 05/19/19 [phone]

## 2019-05-19 ENCOUNTER — Telehealth: Payer: Self-pay | Admitting: Internal Medicine

## 2019-05-19 DIAGNOSIS — K5909 Other constipation: Secondary | ICD-10-CM

## 2019-05-19 NOTE — Telephone Encounter (Signed)
I did not discuss with Dr. Carlean Purl.  I talked to the ED PA last evening and recommended SMOG enema in the ED to clean her out, but nothing specific for her to take ongoing.  I guess if Carlean Purl suggested Motegrity then maybe that is what she needs, but it was not discussed with me.

## 2019-05-19 NOTE — Telephone Encounter (Signed)
Looks like the note says to discuss sending Motegrity at her f/u visit. Will route to Alonza Bogus, PA-C to see what she talked about with Dr Carlean Purl.

## 2019-05-19 NOTE — Telephone Encounter (Signed)
Patient was seen at ED yesterday- she states that the PA on call was in contact with Dr. Carlean Purl about patient- states that they were going to call in a medication for her. (does not know the name of it) She is following up on it to see if it was called in or will be called in. Requesting a phone call back. -[facility]

## 2019-05-19 NOTE — Telephone Encounter (Signed)
Spoke with [name] and told her what [name] said. Margaret Fletcher has had no more bowel movements today, no fever, stomach still distended but better. Please advise Sir if she needs f/u visit or are we going to just send in the motegrity? Thank you.

## 2019-05-22 ENCOUNTER — Telehealth: Payer: Self-pay

## 2019-05-22 ENCOUNTER — Other Ambulatory Visit: Payer: Self-pay | Admitting: Internal Medicine

## 2019-05-22 NOTE — Telephone Encounter (Signed)
Please get an update from her re: bowel movements  Also - I apologize if we have reviewed this before but find out if she has used Linzess or Amatiza in past   Then I will make recommendations about treatment and f/u

## 2019-05-22 NOTE — Telephone Encounter (Signed)
See previous note. Per Dr Carlean Purl I have advised Margaret Fletcher to take 4 Dulcolax today and an enema if she can. We will see her tomorrow to check for an impaction. We have her set up for a therapeutic gastrograffen enema 05/25/2019 at 8:00AM at [facility].

## 2019-05-22 NOTE — Telephone Encounter (Signed)
She needs a therapeutic gastrograffen enema please - scheduled in radiology

## 2019-05-22 NOTE — Addendum Note (Signed)
Addended by: [place], Delani Kohli E on: 05/22/2019 03:09 PM   Modules accepted: Orders

## 2019-05-22 NOTE — Telephone Encounter (Signed)
I have her set up for 05/25/2019 at 8:00AM at [facility] , that is the soonest available. What prep kit do we send in Sir. Radiology said we send it in. Thank- you Sir.

## 2019-05-22 NOTE — Telephone Encounter (Signed)
Margaret Fletcher has only had one loose bowel movement on Saturday and none since. She has tried Linzess in the past with no results she said.

## 2019-05-23 ENCOUNTER — Ambulatory Visit: Payer: Medicare HMO | Admitting: Internal Medicine

## 2019-05-23 ENCOUNTER — Ambulatory Visit (INDEPENDENT_AMBULATORY_CARE_PROVIDER_SITE_OTHER)
Admission: RE | Admit: 2019-05-23 | Discharge: 2019-05-23 | Disposition: A | Discharge status: Home / Self Care | Payer: Medicare HMO | Source: Ambulatory Visit | Attending: Internal Medicine | Admitting: Internal Medicine

## 2019-05-23 ENCOUNTER — Other Ambulatory Visit: Payer: Self-pay

## 2019-05-23 VITALS — BP 128/74 | HR 88 | Temp 98.3°F | Ht 68.0 in | Wt 201.0 lb

## 2019-05-23 DIAGNOSIS — M6289 Other specified disorders of muscle: Secondary | ICD-10-CM

## 2019-05-23 DIAGNOSIS — K5909 Other constipation: Secondary | ICD-10-CM

## 2019-05-23 DIAGNOSIS — R109 Unspecified abdominal pain: Secondary | ICD-10-CM | POA: Diagnosis not present

## 2019-05-23 DIAGNOSIS — F1190 Opioid use, unspecified, uncomplicated: Secondary | ICD-10-CM | POA: Diagnosis not present

## 2019-05-23 MED ORDER — MOTEGRITY 2 MG PO TABS: 1.0000 | ORAL_TABLET | Freq: Every day | ORAL | 0 refills | Status: AC

## 2019-05-23 NOTE — Patient Instructions (Signed)
  Please go to the x-ray department before leaving today.   We are giving you samples of Motegrity to try , take one daily with or without food.    I appreciate the opportunity to care for you. Silvano Rusk, MD, [facility]

## 2019-05-23 NOTE — Progress Notes (Signed)
Margaret Fletcher 49 y.o. [birth year] [id number]  Assessment & Plan:   Encounter Diagnoses  Name Primary?  . Chronic constipation Yes  . Chronic, continuous use of opioids    Multifactorial I suppose She is better than post colon prep retention but still not moving bowels well  There appears to be pelvic floor dysfunction also  Plan to try Motegrity samples in addition to current regimen  May need anorectal manometry ? MR defecography   Will do repeat KUB today and call her tomorrow  Colonoscopy for rectal bleeding is on hold until she has better defecation patterns as I have been unable to prep her.  No mass seen on CT scan though not an adequate evaluation is reassuring I am suspicious of anorectal bleeding anyway.  She is currently scheduled for a therapeutic Gastrografin enema on 3/18  CC: Rutherford Guys, MD Dr. Greer Pickerel  Subjective:   Chief Complaint: fecal impaction  HPI Margaret Fletcher is here for follow-up, she has had a rough time as she was trying to do a double colonoscopy prep last week but after the first round of bisacodyl and MiraLAX she did not move her bowels at all and was markedly distended physically and on imaging including abdominal films and CT scan.  Trial of enemas at home did not help.  She went to the ER where a smog enema and some other maneuvers did seem to provide benefit and showed an improved abdominal film without air-fluid levels..  She is only had 1 small bowel movement or perhaps larger bowel movement there was a loose stool 4 days ago and none since.  Yesterday she took Dulcolax with no benefit.  She has not tried an enema there must have been a misunderstanding as I had suggested she try that as well.  She is uncomfortable but not in great distress and clearly less distended.  She does report that she has tried Linzess in the past but does not know the dose that she used but recalls that it did not help.  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  Movantik may have helped a little bit when she started it.  However not a big difference.  We still have not done a colonoscopy because cannot get her prepped. Allergies  Allergen Reactions  . Sulfa Antibiotics Anaphylaxis  . Metoclopramide Other (See Comments)    Elevated prolactin >5x ULN induction lactation when taking scheduled for several months  . Zosyn [Piperacillin Sod-Tazobactam So] Rash    Has patient had a PCN reaction causing immediate rash, facial/tongue/throat swelling, SOB or lightheadedness with hypotension: No Has patient had a PCN reaction causing severe rash involving mucus membranes or skin necrosis: No Has patient had a PCN reaction that required hospitalization: Unknown--inpatient when reaction occurred Has patient had a PCN reaction occurring within the last 10 years: Yes If all of the above answers are "NO", then may proceed with Cephalosporin use.    Current Meds  Medication Sig  . albuterol (PROVENTIL HFA;VENTOLIN HFA) 108 (90 Base) MCG/ACT inhaler Inhale 2 puffs into the lungs every 6 (six) hours as needed for wheezing or shortness of breath.  Marland Kitchen amitriptyline (ELAVIL) 10 MG tablet TAKE 2 TABLETS BY MOUTH AT BEDTIME. (Patient taking differently: Take 20 mg by mouth at bedtime. )  . azelastine (ASTELIN) 0.1 % nasal spray Place 1 spray into both nostrils 2 (two) times daily. Use in  each nostril as directed  . BAYER MICROLET LANCETS lancets Use as instructed  . BELBUCA 450 MCG FILM Take 450 mcg by mouth daily as needed (breakthrough pain).   . BELBUCA 600 MCG FILM Take 1 strip by mouth 2 (two) times daily.  . busPIRone (BUSPAR) 5 MG tablet Take 1 tablet (5 mg total) by mouth 3 (three) times daily.  Marland Kitchen CALCIUM-VITAMIN D PO Take 1 tablet by mouth 2 (two) times daily. chewable  . cetirizine (ZYRTEC) 10 MG tablet Take 1 tablet (10 mg total) by mouth daily. (Patient  taking differently: Take 10 mg by mouth daily as needed for allergies. )  . chlorhexidine (PERIDEX) 0.12 % solution Use as directed 15 mLs in the mouth or throat 2 (two) times daily.  . Cyanocobalamin (VITAMIN B-12 SL) Place 1 drop under the tongue daily. 1 dropperful  . cyclobenzaprine (FLEXERIL) 5 MG tablet TAKE 1 TABLET BY MOUTH 3 TIMES DAILY AS NEEDED FOR MUSCLE SPASMS. MAY TAKE 2 TABS BY MOUTH AT BEDTIME (Patient taking differently: Take 5-10 mg by mouth See admin instructions. TAKE 5mg  BY MOUTH 3 TIMES DAILY AS NEEDED FOR MUSCLE SPASMS. MAY TAKE 10mg  BY MOUTH AT BEDTIME)  . desvenlafaxine (PRISTIQ) 100 MG 24 hr tablet TAKE 1 TABLET BY MOUTH ONCE A DAY (Patient taking differently: Take 100 mg by mouth daily. )  . docusate sodium (COLACE) 100 MG capsule Take 100 mg by mouth daily as needed.   . eletriptan (RELPAX) 40 MG tablet Take 1 tablet (40 mg total) by mouth every 2 (two) hours as needed for migraine or headache. Do not use >2 doses/24 hours  . famotidine (PEPCID) 20 MG tablet Take 1 tablet (20 mg total) by mouth 2 (two) times daily.  . fluticasone (FLONASE) 50 MCG/ACT nasal spray Place 2 sprays into both nostrils daily. (Patient taking differently: Place 2 sprays into both nostrils daily as needed for allergies. )  . glucose blood (CONTOUR NEXT TEST) test strip 1 each by Other route 4 (four) times daily as needed (hypoglycemia). K91.2  . hydrocortisone (ANUSOL-HC) 25 MG suppository Place 1 suppository (25 mg total) rectally at bedtime. Use for 5 nights  . mometasone (ELOCON) 0.1 % lotion Apply 3 drops topically See admin instructions. 3 drops into affected ear(s) tic for 2-4 days until itching resolves repeat as needed  . Multiple Vitamins-Minerals (BARIATRIC MULTIVITAMINS/IRON PO) Take 1 tablet by mouth 3 (three) times daily.  . naloxegol oxalate (MOVANTIK) 25 MG TABS tablet Take 1 tablet (25 mg total) by mouth daily.  . Omega-3 Fatty Acids (OMEGA-3 FISH OIL PO) Take by mouth daily.  Marland Kitchen  omeprazole (PRILOSEC) 20 MG capsule Take 1 capsule (20 mg total) by mouth every morning.  . ondansetron (ZOFRAN ODT) 4 MG disintegrating tablet Take 1 tablet (4 mg total) by mouth every 8 (eight) hours as needed for nausea or vomiting.  Marland Kitchen OVER THE COUNTER MEDICATION Meca root  . OVER THE COUNTER MEDICATION Calcium, magnesium, zinc with D3 (one tablet daily)  . OVER THE COUNTER MEDICATION Collagen and vitamin c and biotin (takes one daily)  . OVER THE COUNTER MEDICATION Tunic for muscle and joint health  . polyethylene glycol (MIRALAX / GLYCOLAX) packet Take 17 g by mouth daily as needed (for constipation.).   Marland Kitchen PREMARIN vaginal cream Place 1 Applicatorful vaginally 2 (two) times a week.   . simethicone (MYLICON) 80 MG chewable tablet Chew 80 mg by mouth every 6 (six) hours as needed for flatulence.  . topiramate (TOPAMAX) 100  MG tablet Take 1.5 tablets (150 mg total) by mouth daily.  Marland Kitchen triamterene-hydrochlorothiazide (MAXZIDE-25) 37.5-25 MG tablet Take 1 tablet by mouth daily.   Past Medical History:  Diagnosis Date  . Adie's pupil, left   . Alopecia   . Anxiety   . Autoimmune disease ([place])    "Alopecia"  . Bacterial vaginosis   . Cyst of spleen   . Depression   . Disorder of oral soft tissue 03/17/2019  . Frequent UTI   . Hypertension   . Hypertrophy of breast   . IIH (idiopathic intracranial hypertension) 2017   "related to headaches"  . Injury of jawline 03/17/2019  . Migraine headache    denies, ruled out - Propranolol.Using for headaches  . Non-suppurative otitis media 03/17/2019   Past Surgical History:  Procedure Laterality Date  . APPENDECTOMY    . BREAST EXCISIONAL BIOPSY Right 2017   removed at time of reduction  . BREAST REDUCTION SURGERY Bilateral 03/26/2015   Procedure: BILATERAL BREAST REDUCTION  ;  Surgeon: Youlanda Roys, MD;  Location: [facility];  Service: Plastics;  Laterality: Bilateral;  . BREATH TEK H PYLORI N/A 05/21/2014   Procedure:  BREATH TEK H PYLORI;  Surgeon: Greer Pickerel, MD;  Location: [facility];  Service: General;  Laterality: N/A;  . CELIAC PLEXUS BLOCK  07/18/2018   Per patient it was done at [facility]    . CESAREAN SECTION    . CHOLECYSTECTOMY    . DILITATION & CURRETTAGE/HYSTROSCOPY WITH NOVASURE ABLATION N/A 07/19/2012   Procedure: HYSTEROSCOPY WITH NOVASURE ABLATION;  Surgeon: Farrel Gobble. Harrington Challenger, MD;  Location: [facility];  Service: Gynecology;  Laterality: N/A;  . ENDOMETRIAL ABLATION    . ESOPHAGOGASTRODUODENOSCOPY N/A 08/16/2015   Procedure: UPPER ESOPHAGOGASTRODUODENOSCOPY (EGD);  Surgeon: Greer Pickerel, MD;  Location: [facility];  Service: General;  Laterality: N/A;  . ESOPHAGOGASTRODUODENOSCOPY N/A 02/21/2016   Procedure: ESOPHAGOGASTRODUODENOSCOPY (EGD);  Surgeon: Greer Pickerel, MD;  Location: [facility];  Service: General;  Laterality: N/A;  . ESOPHAGOGASTRODUODENOSCOPY N/A 02/24/2017   Procedure: ESOPHAGOGASTRODUODENOSCOPY (EGD);  Surgeon: Greer Pickerel, MD;  Location: [facility];  Service: General;  Laterality: N/A;  . IR GENERIC HISTORICAL  10/04/2015   IR [place] Vivianne Master 10/04/2015 Markus Daft, MD [facility]  . IR GENERIC HISTORICAL  01/31/2016   IR [place] GASTRO/COLONIC TUBE PERCUT W/FLUORO 01/31/2016 Corrie Mckusick, DO [facility]  . IR GENERIC HISTORICAL  04/06/2016   IR [place] TUBE PERCUT W/FLUORO 04/06/2016 Arne Cleveland, MD [facility]  . IR GENERIC HISTORICAL  05/14/2016   IR GASTRIC TUBE PERC CHG W/O IMG GUIDE 05/14/2016 Sandi Mariscal, MD [facility]  . LAPAROSCOPIC GASTROSTOMY N/A 09/30/2015   Procedure: LAPAROSCOPIC GASTROSTOMY TUBE PLACEMENT;  Surgeon: Greer Pickerel, MD;  Location: [facility];  Service: General;  Laterality: N/A;  . LAPAROSCOPIC ROUX-EN-Y GASTRIC BYPASS WITH HIATAL HERNIA REPAIR N/A 07/08/2015   Procedure: LAPAROSCOPIC ROUX-EN-Y GASTRIC BYPASS WITH HIATAL HERNIA REPAIR WITH UPPER ENDOSCOPY;  Surgeon: Greer Pickerel, MD;  Location:  [facility];  Service: General;  Laterality: N/A;  . LAPAROSCOPIC SMALL BOWEL RESECTION N/A 09/30/2015   Procedure: LAPAROSCOPIC REVISION OF ROUX LIMB;  Surgeon: Greer Pickerel, MD;  Location: [facility];  Service: General;  Laterality: N/A;  . LAPAROSCOPY N/A 09/30/2015   Procedure: LAPAROSCOPY DIAGNOSTIC;  Surgeon: Greer Pickerel, MD;  Location: [facility];  Service: General;  Laterality: N/A;  . REDUCTION MAMMAPLASTY Bilateral 2017  . TUBAL LIGATION    . WISDOM TOOTH  EXTRACTION     Social History   Social History Narrative   Lives with partner   Caffeine use: minimal coffee   Right-handed   family history includes Breast cancer in her maternal aunt, maternal aunt, maternal aunt, and mother; Cancer in her mother; Colon polyps in her father; Diabetes in her brother; Seizures in her son; Stroke in her father.   Review of Systems   Objective:   Physical Exam BP 128/74   Pulse 88   Temp 98.3 F (36.8 C)   Ht 5\' 8"  (1.727 m)   Wt 201 lb (91.2 kg)   BMI 30.56 kg/m  NAD  Dorisann Frames, [name] present  Rectal  NL anoderm NL resting and voluntary anal tone No impaction, non-tender and no mass  Simulated defecation Appropriate abd CTR but little or no descent with this maneuver and ? Adequate relaxation

## 2019-05-24 ENCOUNTER — Other Ambulatory Visit: Payer: Self-pay | Admitting: *Deleted

## 2019-05-24 ENCOUNTER — Encounter: Payer: Self-pay | Admitting: *Deleted

## 2019-05-24 DIAGNOSIS — K5909 Other constipation: Secondary | ICD-10-CM

## 2019-05-25 ENCOUNTER — Ambulatory Visit (HOSPITAL_COMMUNITY): Admission: RE | Admit: 2019-05-25 | Payer: Medicare HMO | Source: Ambulatory Visit

## 2019-05-26 ENCOUNTER — Telehealth: Payer: Self-pay | Admitting: Gastroenterology

## 2019-05-26 MED FILL — CYCLOBENZAPRINE HCL 5 MG TA: 5 | 30 days supply | Qty: 30 | Fill #1

## 2019-05-26 MED FILL — ONDANSETRON ODT 8 MG TABLET: 8 | 6 days supply | Qty: 20 | Fill #2

## 2019-05-26 MED FILL — BELBUCA 600 MCG FILM: 600 | 30 days supply | Qty: 60 | Fill #1

## 2019-05-26 NOTE — Telephone Encounter (Signed)
Patient called through answering service last evening complaining of abdominal pain and constipation.  Patient of Dr. Carlean Purl -'s office note from 2 days ago and previous records were reviewed.  Chronic abdominal pain and constipation in the setting of chronic opioid use, and question of pelvic floor dysfunction.  Was scheduled for therapeutic Gastrografin enema yesterday, but canceled by our office when KUB the day prior did not show obstruction.  Patient had been in ED about a week prior with impaction. Dr Carlean Purl started Motegrity at this week's office visit, patient had not noticed significant improvement yet.  Small BM yesterday, first in for 5 days.  Is complaining of acute on chronic abdominal pain, more pain than she usually experiences and a single episode of vomiting.  She was taking additional doses of her buprenorphine for the pain.  After speaking with the patient reviewing records, I recommended she take 2 fleets enemas last evening.  If there was no further vomiting, then she was also to take 3 packets of MiraLAX in 32 ounces of water and drink that over the course of about 2 hours in hopes of getting some relief of constipation.  I also recommended she not continue to take escalating doses of the buprenorphine, as it was likely exacerbating the constipation.  Please call her sometime today for an update.

## 2019-05-26 NOTE — Telephone Encounter (Signed)
Spoke to the patient who gave an update:  She reports she is feeling better after the enema. She stated she didn't release much stool but did release a lot of "air." She reports after her BM's, she has and increase in abd pain (intestinal spasms?) that causes intense nauseas which induces vomiting. She reports this happens often.   She is scheduled for her anorectal mano on 06/02/19. She has already been instructed concerning this procedure.   She thanked the MD's and [facility] staff that has helped her during this difficult time but reports she feels better since last night.

## 2019-05-30 ENCOUNTER — Ambulatory Visit (INDEPENDENT_AMBULATORY_CARE_PROVIDER_SITE_OTHER): Payer: Medicare HMO | Admitting: Family Medicine

## 2019-05-30 ENCOUNTER — Other Ambulatory Visit: Payer: Self-pay

## 2019-05-30 ENCOUNTER — Other Ambulatory Visit (HOSPITAL_COMMUNITY): Admission: RE | Admit: 2019-05-30 | Payer: Medicare HMO | Source: Ambulatory Visit

## 2019-05-30 ENCOUNTER — Encounter: Payer: Self-pay | Admitting: Family Medicine

## 2019-05-30 ENCOUNTER — Other Ambulatory Visit: Payer: Self-pay | Admitting: Internal Medicine

## 2019-05-30 VITALS — BP 114/75 | HR 77 | Temp 97.7°F | Ht 68.0 in | Wt 147.0 lb

## 2019-05-30 DIAGNOSIS — F329 Major depressive disorder, single episode, unspecified: Secondary | ICD-10-CM

## 2019-05-30 DIAGNOSIS — R413 Other amnesia: Secondary | ICD-10-CM | POA: Diagnosis not present

## 2019-05-30 DIAGNOSIS — I10 Essential (primary) hypertension: Secondary | ICD-10-CM | POA: Diagnosis not present

## 2019-05-30 DIAGNOSIS — N289 Disorder of kidney and ureter, unspecified: Secondary | ICD-10-CM

## 2019-05-30 DIAGNOSIS — F419 Anxiety disorder, unspecified: Secondary | ICD-10-CM

## 2019-05-30 DIAGNOSIS — N3000 Acute cystitis without hematuria: Secondary | ICD-10-CM

## 2019-05-30 DIAGNOSIS — R339 Retention of urine, unspecified: Secondary | ICD-10-CM | POA: Diagnosis not present

## 2019-05-30 DIAGNOSIS — Z1159 Encounter for screening for other viral diseases: Secondary | ICD-10-CM | POA: Diagnosis not present

## 2019-05-30 DIAGNOSIS — F32A Depression, unspecified: Secondary | ICD-10-CM

## 2019-05-30 LAB — POCT URINALYSIS DIP (MANUAL ENTRY)
Bilirubin, UA: NEGATIVE
Glucose, UA: NEGATIVE mg/dL
Ketones, POC UA: NEGATIVE mg/dL
Nitrite, UA: POSITIVE — AB
Protein Ur, POC: 30 mg/dL — AB
Spec Grav, UA: 1.015 (ref 1.010–1.025)
Urobilinogen, UA: 1 E.U./dL
pH, UA: 7 (ref 5.0–8.0)

## 2019-05-30 LAB — SARS CORONAVIRUS 2 (TAT 6-24 HRS): SARS Coronavirus 2: NEGATIVE

## 2019-05-30 MED ORDER — NITROFURANTOIN MONOHYD MACRO 100 MG PO CAPS: 100.0000 mg | ORAL_CAPSULE | Freq: Two times a day (BID) | ORAL | 0 refills | Status: DC

## 2019-05-30 MED ORDER — BUSPIRONE HCL 5 MG PO TABS: 5.0000 mg | ORAL_TABLET | Freq: Three times a day (TID) | ORAL | 0 refills | Status: DC

## 2019-05-30 MED ORDER — TRIAMTERENE-HCTZ 37.5-25 MG PO TABS: 1.0000 | ORAL_TABLET | Freq: Every day | ORAL | 1 refills | Status: DC

## 2019-05-30 MED FILL — busPIRone HCL 5 MG TABS: 5 | 30 days supply | Qty: 90 | Fill #0

## 2019-05-30 MED FILL — NITROFURANTOIN MONO-MCR 100: 100 | 10 days supply | Qty: 20 | Fill #0

## 2019-05-30 NOTE — Patient Instructions (Signed)
° ° ° °  If you have lab work done today you will be contacted with your lab results within the next 2 weeks.  If you have not heard from us then please contact us. The fastest way to get your results is to register for My Chart. ° ° °IF you received an x-ray today, you will receive an invoice from [facility]. Please contact [facility] at [phone] with questions or concerns regarding your invoice.  ° °IF you received labwork today, you will receive an invoice from [facility]. Please contact [facility] at [phone] with questions or concerns regarding your invoice.  ° °Our billing staff will not be able to assist you with questions regarding bills from these companies. ° °You will be contacted with the lab results as soon as they are available. The fastest way to get your results is to activate your My Chart account. Instructions are located on the last page of this paperwork. If you have not heard from us regarding the results in 2 weeks, please contact this office. °  ° ° ° °

## 2019-05-30 NOTE — Progress Notes (Signed)
3/23/20218:42 AM  Margaret Fletcher Margaret Fletcher 10/02/1970, 49 y.o., female [id number]  Chief Complaint  Patient presents with  . 3 month check up  . incomplete urinary  output    HPI:   Patient is a 49 y.o. female with past medical history significant for migraine, idiopathic intracranial hypertension, OSA, constipation, empty sella syndrome, G tube feeding, s/p gastric bypass who presents today for routine followup  Last OV Dec 2020 Started buspar, pending psych Has upcoming anorectal manometry for chronic constipation  About 2 days ago started having dysuria, urgency with low UOP, felt like she was not emptying her bladder No fever or chills Last Ur cx in Jan 2021 - no growth  buspar working ok for her anxiety Did not go to see psych as they wanted to charge her $150 to set up appt Has been wo stimulants and bzd since sept 2020 per pmp   Lab Results  Component Value Date   CREATININE 1.44 (H) 05/18/2019   BUN 19 05/18/2019   NA 135 05/18/2019   K 3.6 05/18/2019   CL 100 05/18/2019   CO2 27 05/18/2019   Lab Results  Component Value Date   CREATININE 1.44 (H) 05/18/2019   CREATININE 1.35 (H) 02/28/2019   CREATININE 1.22 (H) 11/28/2018    Depression screen PHQ 2/9 05/30/2019 04/18/2019 04/03/2019  Decreased Interest 1 0 0  Down, Depressed, Hopeless 1 0 0  PHQ - 2 Score 2 0 0  Altered sleeping 1 0 0  Tired, decreased energy 0 0 0  Change in appetite 1 0 0  Feeling bad or failure about yourself  1 0 0  Trouble concentrating 2 0 0  Moving slowly or fidgety/restless 0 0 0  Suicidal thoughts 0 0 0  PHQ-9 Score 7 0 0  Difficult doing work/chores Somewhat difficult Not difficult at all Not difficult at all  Some recent data might be hidden    Fall Risk  05/30/2019 04/18/2019 04/03/2019 03/17/2019 02/28/2019  Falls in the past year? 0 1 0 1 0  Number falls in past yr: 0 0 0 0 0  Injury with Fall? 0 0 0 1 0  Comment - - - Pt hit her ear and Left jaw was swollen -  Follow up  Falls evaluation completed Falls evaluation completed Falls evaluation completed - -     Allergies  Allergen Reactions  . Sulfa Antibiotics Anaphylaxis  . Metoclopramide Other (See Comments)    Elevated prolactin >5x ULN induction lactation when taking scheduled for several months  . Zosyn [Piperacillin Sod-Tazobactam So] Rash    Has patient had a PCN reaction causing immediate rash, facial/tongue/throat swelling, SOB or lightheadedness with hypotension: No Has patient had a PCN reaction causing severe rash involving mucus membranes or skin necrosis: No Has patient had a PCN reaction that required hospitalization: Unknown--inpatient when reaction occurred Has patient had a PCN reaction occurring within the last 10 years: Yes If all of the above answers are "NO", then may proceed with Cephalosporin use.     Prior to Admission medications   Medication Sig Start Date End Date Taking? Authorizing Provider  albuterol (PROVENTIL HFA;VENTOLIN HFA) 108 (90 Base) MCG/ACT inhaler Inhale 2 puffs into the lungs every 6 (six) hours as needed for wheezing or shortness of breath. 01/25/18  Yes Shawnee Knapp, MD  amitriptyline (ELAVIL) 10 MG tablet TAKE 2 TABLETS BY MOUTH AT BEDTIME. Patient taking differently: Take 20 mg by mouth at bedtime.  10/11/18  Yes Pamella Pert, Benay Spice  M, MD  azelastine (ASTELIN) 0.1 % nasal spray Place 1 spray into both nostrils 2 (two) times daily. Use in each nostril as directed 04/18/19  Yes Maximiano Coss, NP  BAYER MICROLET LANCETS lancets Use as instructed 06/13/17  Yes Shawnee Knapp, MD  BELBUCA 450 MCG FILM Take 450 mcg by mouth daily as needed (breakthrough pain).  01/09/19  Yes [provider]  BELBUCA 600 MCG FILM Take 1 strip by mouth 2 (two) times daily. 04/17/19  Yes [provider]  busPIRone (BUSPAR) 5 MG tablet Take 1 tablet (5 mg total) by mouth 3 (three) times daily. 02/28/19  Yes Rutherford Guys, MD  CALCIUM-VITAMIN D PO Take 1 tablet by mouth 2 (two) times  daily. chewable   Yes [provider]  cetirizine (ZYRTEC) 10 MG tablet Take 1 tablet (10 mg total) by mouth daily. Patient taking differently: Take 10 mg by mouth daily as needed for allergies.  04/18/19  Yes Maximiano Coss, NP  chlorhexidine (PERIDEX) 0.12 % solution Use as directed 15 mLs in the mouth or throat 2 (two) times daily. 03/17/19  Yes Wendall Mola, NP  Cyanocobalamin (VITAMIN B-12 SL) Place 1 drop under the tongue daily. 1 dropperful   Yes [provider]  cyclobenzaprine (FLEXERIL) 5 MG tablet TAKE 1 TABLET BY MOUTH 3 TIMES DAILY AS NEEDED FOR MUSCLE SPASMS. MAY TAKE 2 TABS BY MOUTH AT BEDTIME Patient taking differently: Take 5-10 mg by mouth See admin instructions. TAKE 5mg  BY MOUTH 3 TIMES DAILY AS NEEDED FOR MUSCLE SPASMS. MAY TAKE 10mg  BY MOUTH AT BEDTIME 06/17/18  Yes Rutherford Guys, MD  desvenlafaxine (PRISTIQ) 100 MG 24 hr tablet TAKE 1 TABLET BY MOUTH ONCE A DAY Patient taking differently: Take 100 mg by mouth daily.  03/20/19  Yes Rutherford Guys, MD  docusate sodium (COLACE) 100 MG capsule Take 100 mg by mouth daily as needed.    Yes [provider]  eletriptan (RELPAX) 40 MG tablet Take 1 tablet (40 mg total) by mouth every 2 (two) hours as needed for migraine or headache. Do not use >2 doses/24 hours 01/09/19  Yes Lomax, Amy, NP  famotidine (PEPCID) 20 MG tablet Take 1 tablet (20 mg total) by mouth 2 (two) times daily. 02/28/19  Yes Rutherford Guys, MD  fluticasone [facility]) 50 MCG/ACT nasal spray Place 2 sprays into both nostrils daily. Patient taking differently: Place 2 sprays into both nostrils daily as needed for allergies.  04/18/19  Yes Maximiano Coss, NP  glucose blood (CONTOUR NEXT TEST) test strip 1 each by Other route 4 (four) times daily as needed (hypoglycemia). K91.2 06/10/17  Yes Shawnee Knapp, MD  hydrocortisone (ANUSOL-HC) 25 MG suppository Place 1 suppository (25 mg total) rectally at bedtime. Use for 5 nights 03/23/19  Yes  Kennedy-Smith, Patrecia Pour, NP  mometasone (ELOCON) 0.1 % lotion Apply 3 drops topically See admin instructions. 3 drops into affected ear(s) tic for 2-4 days until itching resolves repeat as needed 04/10/19  Yes [provider]  Multiple Vitamins-Minerals (BARIATRIC MULTIVITAMINS/IRON PO) Take 1 tablet by mouth 3 (three) times daily.   Yes [provider]  naloxegol oxalate (MOVANTIK) 25 MG TABS tablet Take 1 tablet (25 mg total) by mouth daily. 04/19/19  Yes Gatha Mayer, MD  Omega-3 Fatty Acids (OMEGA-3 FISH OIL PO) Take by mouth daily.   Yes [provider]  omeprazole (PRILOSEC) 20 MG capsule Take 1 capsule (20 mg total) by mouth every morning. 03/23/19  Yes Noralyn Pick, NP  ondansetron (ZOFRAN ODT) 4 MG disintegrating tablet Take 1 tablet (4 mg total) by mouth every 8 (eight) hours as needed for nausea or vomiting. 01/06/19  Yes Maximiano Coss, NP  OVER THE COUNTER MEDICATION Meca root   Yes [provider]  OVER THE COUNTER MEDICATION Calcium, magnesium, zinc with D3 (one tablet daily)   Yes [provider]  OVER THE COUNTER MEDICATION Collagen and vitamin c and biotin (takes one daily)   Yes [provider]  [place] for muscle and joint health   Yes [provider]  polyethylene glycol (MIRALAX / GLYCOLAX) packet Take 17 g by mouth daily as needed (for constipation.).    Yes [provider]  PREMARIN vaginal cream Place 1 Applicatorful vaginally 2 (two) times a week.  11/28/18  Yes [provider]  Prucalopride Succinate (MOTEGRITY) 2 MG TABS Take 1 tablet (2 mg total) by mouth daily. 05/23/19  Yes Gatha Mayer, MD  simethicone (MYLICON) 80 MG chewable tablet Chew 80 mg by mouth every 6 (six) hours as needed for flatulence.   Yes [provider]  topiramate (TOPAMAX) 100 MG tablet Take 1.5 tablets (150 mg total) by mouth daily. 01/09/19  Yes Lomax, Amy, NP    triamterene-hydrochlorothiazide (MAXZIDE-25) 37.5-25 MG tablet Take 1 tablet by mouth daily. 02/28/19  Yes Rutherford Guys, MD    Past Medical History:  Diagnosis Date  . Adie's pupil, left   . Alopecia   . Anxiety   . Autoimmune disease ([place])    "Alopecia"  . Bacterial vaginosis   . Cyst of spleen   . Depression   . Disorder of oral soft tissue 03/17/2019  . Frequent UTI   . Hypertension   . Hypertrophy of breast   . IIH (idiopathic intracranial hypertension) 2017   "related to headaches"  . Injury of jawline 03/17/2019  . Migraine headache    denies, ruled out - Propranolol.Using for headaches  . Non-suppurative otitis media 03/17/2019    Past Surgical History:  Procedure Laterality Date  . APPENDECTOMY    . BREAST EXCISIONAL BIOPSY Right 2017   removed at time of reduction  . BREAST REDUCTION SURGERY Bilateral 03/26/2015   Procedure: BILATERAL BREAST REDUCTION  ;  Surgeon: Youlanda Roys, MD;  Location: [facility];  Service: Plastics;  Laterality: Bilateral;  . BREATH TEK H PYLORI N/A 05/21/2014   Procedure: BREATH TEK H PYLORI;  Surgeon: Greer Pickerel, MD;  Location: [facility];  Service: General;  Laterality: N/A;  . CELIAC PLEXUS BLOCK  07/18/2018   Per patient it was done at [facility]    . CESAREAN SECTION    . CHOLECYSTECTOMY    . DILITATION & CURRETTAGE/HYSTROSCOPY WITH NOVASURE ABLATION N/A 07/19/2012   Procedure: HYSTEROSCOPY WITH NOVASURE ABLATION;  Surgeon: Farrel Gobble. Harrington Challenger, MD;  Location: [facility];  Service: Gynecology;  Laterality: N/A;  . ENDOMETRIAL ABLATION    . ESOPHAGOGASTRODUODENOSCOPY N/A 08/16/2015   Procedure: UPPER ESOPHAGOGASTRODUODENOSCOPY (EGD);  Surgeon: Greer Pickerel, MD;  Location: [facility];  Service: General;  Laterality: N/A;  . ESOPHAGOGASTRODUODENOSCOPY N/A 02/21/2016   Procedure: ESOPHAGOGASTRODUODENOSCOPY (EGD);  Surgeon: Greer Pickerel, MD;  Location: [facility];  Service: General;  Laterality: N/A;   . ESOPHAGOGASTRODUODENOSCOPY N/A 02/24/2017   Procedure: ESOPHAGOGASTRODUODENOSCOPY (EGD);  Surgeon: Greer Pickerel, MD;  Location: [facility];  Service: General;  Laterality: N/A;  . IR GENERIC HISTORICAL  10/04/2015  IR [place] GASTRO/COLONIC TUBE PERCUT W/FLUORO 10/04/2015 Markus Daft, MD [facility]  . IR GENERIC HISTORICAL  01/31/2016   IR [place] GASTRO/COLONIC TUBE PERCUT W/FLUORO 01/31/2016 Corrie Mckusick, DO [facility]  . IR GENERIC HISTORICAL  04/06/2016   IR [place] TUBE PERCUT W/FLUORO 04/06/2016 Arne Cleveland, MD [facility]  . IR GENERIC HISTORICAL  05/14/2016   IR GASTRIC TUBE PERC CHG W/O IMG GUIDE 05/14/2016 Sandi Mariscal, MD [facility]  . LAPAROSCOPIC GASTROSTOMY N/A 09/30/2015   Procedure: LAPAROSCOPIC GASTROSTOMY TUBE PLACEMENT;  Surgeon: Greer Pickerel, MD;  Location: [facility];  Service: General;  Laterality: N/A;  . LAPAROSCOPIC ROUX-EN-Y GASTRIC BYPASS WITH HIATAL HERNIA REPAIR N/A 07/08/2015   Procedure: LAPAROSCOPIC ROUX-EN-Y GASTRIC BYPASS WITH HIATAL HERNIA REPAIR WITH UPPER ENDOSCOPY;  Surgeon: Greer Pickerel, MD;  Location: [facility];  Service: General;  Laterality: N/A;  . LAPAROSCOPIC SMALL BOWEL RESECTION N/A 09/30/2015   Procedure: LAPAROSCOPIC REVISION OF ROUX LIMB;  Surgeon: Greer Pickerel, MD;  Location: [facility];  Service: General;  Laterality: N/A;  . LAPAROSCOPY N/A 09/30/2015   Procedure: LAPAROSCOPY DIAGNOSTIC;  Surgeon: Greer Pickerel, MD;  Location: [facility];  Service: General;  Laterality: N/A;  . REDUCTION MAMMAPLASTY Bilateral 2017  . TUBAL LIGATION    . WISDOM TOOTH EXTRACTION      Social History   Tobacco Use  . Smoking status: Never Smoker  . Smokeless tobacco: Never Used  Substance Use Topics  . Alcohol use: Not Currently    Family History  Problem Relation Age of Onset  . Cancer Mother        late 58'-50  . Breast cancer Mother        late 18'-50  . Stroke Father   . Colon polyps Father   . Diabetes Brother   . Seizures Son   . Breast cancer  Maternal Aunt        late 40'-50  . Breast cancer Maternal Aunt        late 40'-50  . Breast cancer Maternal Aunt        late 40'-50  . Migraines Neg Hx     Review of Systems  Constitutional: Negative for chills and fever.  Respiratory: Negative for cough and shortness of breath.   Cardiovascular: Negative for chest pain, palpitations and leg swelling.  per hpi   OBJECTIVE:  Today's Vitals   05/30/19 0827  BP: 114/75  Pulse: 77  Temp: 97.7 F (36.5 C)  TempSrc: Temporal  SpO2: 98%  Weight: 147 lb (66.7 kg)  Height: 5\' 8"  (1.727 m)   Body mass index is 22.35 kg/m.   Physical Exam Vitals and nursing note reviewed.  Constitutional:      Appearance: She is well-developed.  HENT:     Head: Normocephalic and atraumatic.  Eyes:     General: No scleral icterus.    Conjunctiva/sclera: Conjunctivae normal.     Pupils: Pupils are equal, round, and reactive to light.  Pulmonary:     Effort: Pulmonary effort is normal.  Musculoskeletal:     Cervical back: Neck supple.  Skin:    General: Skin is warm and dry.  Neurological:     Mental Status: She is alert and oriented to person, place, and time.     Results for orders placed or performed in visit on 05/30/19 (from the past 24 hour(s))  POCT urinalysis dipstick     Status: Abnormal   Collection Time: 05/30/19  8:44 AM  Result Value Ref Range  Color, UA yellow yellow   Clarity, UA clear clear   Glucose, UA negative negative mg/dL   Bilirubin, UA negative negative   Ketones, POC UA negative negative mg/dL   Spec Grav, UA 1.015 1.010 - 1.025   Blood, UA small (A) negative   pH, UA 7.0 5.0 - 8.0   Protein Ur, POC =30 (A) negative mg/dL   Urobilinogen, UA 1.0 0.2 or 1.0 E.U./dL   Nitrite, UA Positive (A) Negative   Leukocytes, UA Small (1+) (A) Negative    No results found.   ASSESSMENT and PLAN  1. Acute cystitis without hematuria 2. Urinary retention with incomplete bladder emptying Discussed supportive  measures, new meds r/se/b and RTC precautions.  - POCT urinalysis dipstick - Urine Culture  3. Essential hypertension, benign At goal. Cont for now. Monitor crt. Might need to change HCTZ  4. Decreased renal function Discussed pushing fluids, BP control, consider changing HCTZ  5. Anxiety and depression 6. Memory loss due to medical condition Stable. Referring to psych again - Ambulatory referral to Psychiatry  Other orders - nitrofurantoin, macrocrystal-monohydrate, (MACROBID) 100 MG capsule; Take 1 capsule (100 mg total) by mouth 2 (two) times daily. - busPIRone (BUSPAR) 5 MG tablet; Take 1 tablet (5 mg total) by mouth 3 (three) times daily. - triamterene-hydrochlorothiazide (MAXZIDE-25) 37.5-25 MG tablet; Take 1 tablet by mouth daily.  Return in about 3 months (around 08/30/2019).    Rutherford Guys, MD [facility] [place], [place] [address] Ph.  [phone] Fax [phone]

## 2019-05-31 ENCOUNTER — Other Ambulatory Visit: Payer: Self-pay | Admitting: Family Medicine

## 2019-05-31 DIAGNOSIS — N6320 Unspecified lump in the left breast, unspecified quadrant: Secondary | ICD-10-CM

## 2019-06-01 LAB — URINE CULTURE

## 2019-06-02 ENCOUNTER — Encounter (HOSPITAL_COMMUNITY)
Admission: RE | Disposition: A | Discharge status: Home / Self Care | Payer: Self-pay | Source: Home / Self Care | Attending: Internal Medicine

## 2019-06-02 ENCOUNTER — Ambulatory Visit (HOSPITAL_COMMUNITY)
Admission: RE | Admit: 2019-06-02 | Discharge: 2019-06-02 | Disposition: A | Discharge status: Home / Self Care | Payer: Medicare HMO | Attending: Internal Medicine | Admitting: Internal Medicine

## 2019-06-02 DIAGNOSIS — K5900 Constipation, unspecified: Secondary | ICD-10-CM | POA: Diagnosis not present

## 2019-06-02 DIAGNOSIS — K5902 Outlet dysfunction constipation: Secondary | ICD-10-CM

## 2019-06-02 HISTORY — PX: ANAL RECTAL MANOMETRY: SHX6358

## 2019-06-02 SURGERY — MANOMETRY, ANORECTAL

## 2019-06-02 NOTE — Progress Notes (Signed)
Anorectal manometry performed per protocol.  Patient tolerated well.  Balloon expulsion test was done with 50 cc.  Patient unable to expel balloon at 3 minutes.  Report to be sent to Dr Harl Bowie.

## 2019-06-05 ENCOUNTER — Encounter: Payer: Self-pay | Admitting: *Deleted

## 2019-06-05 ENCOUNTER — Other Ambulatory Visit: Payer: Self-pay | Admitting: *Deleted

## 2019-06-05 DIAGNOSIS — M6289 Other specified disorders of muscle: Secondary | ICD-10-CM

## 2019-06-05 DIAGNOSIS — K5909 Other constipation: Secondary | ICD-10-CM

## 2019-06-12 ENCOUNTER — Telehealth: Payer: Self-pay | Admitting: Internal Medicine

## 2019-06-12 ENCOUNTER — Ambulatory Visit: Payer: Medicare HMO | Attending: Internal Medicine | Admitting: Physical Therapy

## 2019-06-12 ENCOUNTER — Other Ambulatory Visit: Payer: Self-pay

## 2019-06-12 DIAGNOSIS — R252 Cramp and spasm: Secondary | ICD-10-CM | POA: Diagnosis not present

## 2019-06-12 DIAGNOSIS — R279 Unspecified lack of coordination: Secondary | ICD-10-CM

## 2019-06-12 DIAGNOSIS — R110 Nausea: Secondary | ICD-10-CM

## 2019-06-12 DIAGNOSIS — M6281 Muscle weakness (generalized): Secondary | ICD-10-CM | POA: Diagnosis not present

## 2019-06-12 NOTE — Telephone Encounter (Signed)
May I refill her zofran Sir, thank you.

## 2019-06-12 NOTE — Patient Instructions (Addendum)
Toileting Techniques for Bowel Movements (Defecation) Using your belly (abdomen) and pelvic floor muscles to have a bowel movement is usually instinctive.  Sometimes people can have problems with these muscles and have to relearn proper defecation (emptying) techniques.  If you have weakness in your muscles, organs that are falling out, decreased sensation in your pelvis, or ignore your urge to go, you may find yourself straining to have a bowel movement.  You are straining if you are: . holding your breath or taking in a huge gulp of air and holding it  . keeping your lips and jaw tensed and closed tightly . turning red in the face because of excessive pushing or forcing . developing or worsening your  hemorrhoids . getting faint while pushing . not emptying completely and have to defecate many times a day  If you are straining, you are actually making it harder for yourself to have a bowel movement.  Many people find they are pulling up with the pelvic floor muscles and closing off instead of opening the anus. Due to lack pelvic floor relaxation and coordination the abdominal muscles, one has to work harder to push the feces out.  Many people have never been taught how to defecate efficiently and effectively.  Notice what happens to your body when you are having a bowel movement.  While you are sitting on the toilet pay attention to the following areas: . Jaw and mouth position . Angle of your hips   . Whether your feet touch the ground or not . Arm placement  . Spine position . Waist . Belly tension . Anus (opening of the anal canal)  An Evacuation/Defecation Plan   Here are the 4 basic points:  1. Lean forward enough for your elbows to rest on your knees 2. Support your feet on the floor or use a low stool if your feet don't touch the floor  3. Push out your belly as if you have swallowed a beach ball--you should feel a widening of your waist 4. Open and relax your pelvic floor  muscles, rather than tightening around the anus       The following conditions my require modifications to your toileting posture:  . If you have had surgery in the past that limits your back, hip, pelvic, knee or ankle flexibility . Constipation   Your healthcare practitioner may make the following additional suggestions and adjustments:  1) Sit on the toilet  a) Make sure your feet are supported. b) Notice your hip angle and spine position--most people find it effective to lean forward or raise their knees, which can help the muscles around the anus to relax  c) When you lean forward, place your forearms on your thighs for support  2) Relax suggestions a) Breath deeply in through your nose and out slowly through your mouth as if you are smelling the flowers and blowing out the candles. b) To become aware of how to relax your muscles, contracting and releasing muscles can be helpful.  Pull your pelvic floor muscles in tightly by using the image of holding back gas, or closing around the anus (visualize making a circle smaller) and lifting the anus up and in.  Then release the muscles and your anus should drop down and feel open. Repeat 5 times ending with the feeling of relaxation. c) Keep your pelvic floor muscles relaxed; let your belly bulge out. d) The digestive tract starts at the mouth and ends at the anal opening, so  be sure to relax both ends of the tube.  Place your tongue on the roof of your mouth with your teeth separated.  This helps relax your mouth and will help to relax the anus at the same time.  3) Empty (defecation) a) Keep your pelvic floor and sphincter relaxed, then bulge your anal muscles.  Make the anal opening wide.  b) Stick your belly out as if you have swallowed a beach ball. c) Make your belly wall hard using your belly muscles while continuing to breathe. Doing this makes it easier to open your anus. d) Breath out and give a grunt (or try using other sounds  such as ahhhh, shhhhh, ohhhh or grrrrrrr).  4) Finish a) As you finish your bowel movement, pull the pelvic floor muscles up and in.  This will leave your anus in the proper place rather than remaining pushed out and down. If you leave your anus pushed out and down, it will start to feel as though that is normal and give you incorrect signals about needing to have a bowel movement. Access Code: [email or web address] URL: [email or web address] Date: 06/12/2019 Prepared by: Jari Favre  Exercises Supine Diaphragmatic Breathing with Pelvic Floor Lengthening - 3 x daily - 7 x weekly - 10 reps - 1 sets Seated Hamstring Stretch - 1 x daily - 7 x weekly - 3 reps - 1 sets - 30 sec hold

## 2019-06-12 NOTE — Therapy (Signed)
[facility] [address] [place], [place], [address] Phone: [phone]   Fax:  [phone]  Physical Therapy Evaluation  Patient Details  Name: Margaret Fletcher MRN: [id number] Date of Birth: Mar 12, 1970 Referring Provider (PT): Gatha Mayer, MD   Encounter Date: 06/12/2019  PT End of Session - 06/12/19 0954    Visit Number  1    Number of Visits  9    Date for PT Re-Evaluation  08/07/19    Authorization Type  [employer]    PT Start Time  [phone]    PT Stop Time  1015    PT Time Calculation (min)  39 min    Activity Tolerance  Patient limited by pain       Past Medical History:  Diagnosis Date  . Adie's pupil, left   . Alopecia   . Anxiety   . Autoimmune disease ([place])    "Alopecia"  . Bacterial vaginosis   . Cyst of spleen   . Depression   . Disorder of oral soft tissue 03/17/2019  . Frequent UTI   . Hypertension   . Hypertrophy of breast   . IIH (idiopathic intracranial hypertension) 2017   "related to headaches"  . Injury of jawline 03/17/2019  . Migraine headache    denies, ruled out - Propranolol.Using for headaches  . Non-suppurative otitis media 03/17/2019    Past Surgical History:  Procedure Laterality Date  . ANAL RECTAL MANOMETRY N/A 06/02/2019   Procedure: ANO RECTAL MANOMETRY;  Surgeon: Gatha Mayer, MD;  Location: [facility];  Service: Endoscopy;  Laterality: N/A;  . APPENDECTOMY    . BREAST EXCISIONAL BIOPSY Right 2017   removed at time of reduction  . BREAST REDUCTION SURGERY Bilateral 03/26/2015   Procedure: BILATERAL BREAST REDUCTION  ;  Surgeon: Youlanda Roys, MD;  Location: [facility];  Service: Plastics;  Laterality: Bilateral;  . BREATH TEK H PYLORI N/A 05/21/2014   Procedure: BREATH TEK H PYLORI;  Surgeon: Greer Pickerel, MD;  Location: [facility];  Service: General;  Laterality: N/A;  . CELIAC PLEXUS BLOCK  07/18/2018   Per patient it was done at  [place]    . CESAREAN SECTION    . CHOLECYSTECTOMY    . DILITATION & CURRETTAGE/HYSTROSCOPY WITH NOVASURE ABLATION N/A 07/19/2012   Procedure: HYSTEROSCOPY WITH NOVASURE ABLATION;  Surgeon: Farrel Gobble. Harrington Challenger, MD;  Location: [facility];  Service: Gynecology;  Laterality: N/A;  . ENDOMETRIAL ABLATION    . ESOPHAGOGASTRODUODENOSCOPY N/A 08/16/2015   Procedure: UPPER ESOPHAGOGASTRODUODENOSCOPY (EGD);  Surgeon: Greer Pickerel, MD;  Location: [facility];  Service: General;  Laterality: N/A;  . ESOPHAGOGASTRODUODENOSCOPY N/A 02/21/2016   Procedure: ESOPHAGOGASTRODUODENOSCOPY (EGD);  Surgeon: Greer Pickerel, MD;  Location: [facility];  Service: General;  Laterality: N/A;  . ESOPHAGOGASTRODUODENOSCOPY N/A 02/24/2017   Procedure: ESOPHAGOGASTRODUODENOSCOPY (EGD);  Surgeon: Greer Pickerel, MD;  Location: [facility];  Service: General;  Laterality: N/A;  . IR GENERIC HISTORICAL  10/04/2015   IR [place] Vivianne Master 10/04/2015 Markus Daft, MD [facility]  . IR GENERIC HISTORICAL  01/31/2016   IR [place] GASTRO/COLONIC TUBE PERCUT W/FLUORO 01/31/2016 Corrie Mckusick, DO [facility]  . IR GENERIC HISTORICAL  04/06/2016   IR [place] TUBE PERCUT W/FLUORO 04/06/2016 Arne Cleveland, MD [facility]  . IR GENERIC HISTORICAL  05/14/2016   IR GASTRIC TUBE PERC CHG W/O IMG GUIDE 05/14/2016 Sandi Mariscal, MD [facility]  . LAPAROSCOPIC GASTROSTOMY  N/A 09/30/2015   Procedure: LAPAROSCOPIC GASTROSTOMY TUBE PLACEMENT;  Surgeon: Greer Pickerel, MD;  Location: [facility];  Service: General;  Laterality: N/A;  . LAPAROSCOPIC ROUX-EN-Y GASTRIC BYPASS WITH HIATAL HERNIA REPAIR N/A 07/08/2015   Procedure: LAPAROSCOPIC ROUX-EN-Y GASTRIC BYPASS WITH HIATAL HERNIA REPAIR WITH UPPER ENDOSCOPY;  Surgeon: Greer Pickerel, MD;  Location: [facility];  Service: General;  Laterality: N/A;  . LAPAROSCOPIC SMALL BOWEL RESECTION N/A 09/30/2015   Procedure: LAPAROSCOPIC REVISION OF ROUX LIMB;  Surgeon: Greer Pickerel, MD;   Location: [facility];  Service: General;  Laterality: N/A;  . LAPAROSCOPY N/A 09/30/2015   Procedure: LAPAROSCOPY DIAGNOSTIC;  Surgeon: Greer Pickerel, MD;  Location: [facility];  Service: General;  Laterality: N/A;  . REDUCTION MAMMAPLASTY Bilateral 2017  . TUBAL LIGATION    . WISDOM TOOTH EXTRACTION      There were no vitals filed for this visit.   Subjective Assessment - 06/12/19 0941    Subjective  Pt needs to use an enema to have BM.  Pt is having trouble with a few bites of food giving her a lot of pain.  BM with enema every few day.  Pt states her pain is really    Patient Stated Goals  decreased pain overall, able to have a BM    Currently in Pain?  Yes    Pain Score  7    goes down to 5/10   Pain Location  Abdomen    Pain Descriptors / Indicators  Aching   nausea   Pain Type  Chronic pain    Pain Radiating Towards  Pt having pain from naval and up to the sternum    Pain Onset  More than a month ago    Pain Frequency  Constant    Aggravating Factors   eating, not being able to have BM    Pain Relieving Factors  rocking, medication, flexion on ball, heat, pain and nausea meds    Multiple Pain Sites  Yes    Pain Score  5    Pain Location  Neck    Pain Orientation  Right    Pain Descriptors / Indicators  Aching;Tingling    Pain Type  Chronic pain    Pain Radiating Towards  jaw to neck/arm down to the thumb, index, and middle    Pain Onset  More than a month ago    Pain Frequency  Intermittent         [facility] PT Assessment - 06/12/19 0001      Assessment   Medical Diagnosis  constipation    Referring Provider (PT)  Gatha Mayer, MD    Onset Date/Surgical Date  --   2017     Precautions   Precautions  None      Restrictions   Weight Bearing Restrictions  No      Balance Screen   Has the patient fallen in the past 6 months  Yes    How many times?  1    Has the patient had a decrease in activity level because of a fear of falling?   No    Is the patient reluctant to  leave their home because of a fear of falling?   No      Home Social worker  Private residence    Living Arrangements  Spouse/significant other    Type of Home  Other(Comment)   [place] to enter   1 flight  Prior Function   Level of Independence  Independent      Cognition   Overall Cognitive Status  Within Functional Limits for tasks assessed      Sensation   Light Touch  Appears Intact      Coordination   Gross Motor Movements are Fluid and Coordinated  Yes   Grossly WNLs     Posture/Postural Control   Posture/Postural Control  Postural limitations    Postural Limitations  Forward head;Increased thoracic kyphosis      ROM / Strength   AROM / PROM / Strength  PROM      AROM   Overall AROM   --   cervical flex,L and R sidebending and rotation WNLs c pain   Overall AROM Comments  lumbar flexion 50%      PROM   Overall PROM Comments  hip rotation Rt 50%; Lt 75%      Flexibility   Soft Tissue Assessment /Muscle Length  yes    Hamstrings  Rt 70%; Lt 90%      Palpation   Palpation comment  thoracic and lumbar parapspinals tight; upper abdomen bulges and firm; diaphragm tight and 50% mobility palpatedin ribcage   Increased tightness of upper traps     Special Tests    Special Tests  --   Upper extremity myotome test was negative   Other special tests  --   Pt reported numbness of the R lat. arm 1-4 fingers     Ambulation/Gait   Gait Pattern  Within Functional Limits                Objective measurements completed on examination: See above findings.    Pelvic Floor Special Questions - 06/12/19 0001    Prior Pelvic/Prostate Exam  Yes    Diagnostic Test/Procedure Performed  Yes    Type Diagnostic/Procedure  --   has chronic UTIs   Are you Pregnant or attempting pregnancy?  No    Prior Pregnancies  Yes    Number of Pregnancies  2   1 miscarriage   Number of C-Sections  2    Number of Vaginal Deliveries  0     Currently Sexually Active  Yes    Is this Painful  Yes    Marinoff Scale  discomfort that does not affect completion   positional   Urinary Leakage  Yes    How often  when have a UTI    Pad use  has to use a liner    Activities that cause leaking  With strong urge    Urinary urgency  Yes    Urinary frequency  has to bear down to empty bladder; bear down to empty BM; chronic UTIs    Fecal incontinence  --   sometimes leak when trying to pass gas   Fluid intake  16 oz - cannot hold much in her stomach    Caffeine beverages  yes    Falling out feeling (prolapse)  No    Skin Integrity  Intact;Hemorroids    Perineal Body/Introitus   Elevated    Pelvic Floor Internal Exam  identity confirmed and pt informed consent given    Exam Type  Rectal    Palpation  internal sphincter very high tone; no muscle coordination for expulsion from rectum    Strength  fair squeeze, definite lift    Strength # of seconds  4    Tone  high       [facility] Adult  PT Treatment/Exercise - 06/12/19 0001      Self-Care   Self-Care  Other Self-Care Comments    Other Self-Care Comments   intial HEP and toilet techniques             PT Education - 06/12/19 1046    Education Details  Access Code: [email or web address] and toileting techniques    Person(s) Educated  Patient    Methods  Explanation;Demonstration;Verbal cues;Handout    Comprehension  Verbalized understanding;Returned demonstration       PT Short Term Goals - 06/12/19 1048      PT SHORT TERM GOAL #1   Title  Pt will be Ind c an initial HEP and toileting techniques    Time  3    Period  Weeks    Status  New    Target Date  07/03/19        PT Long Term Goals - 06/12/19 1048      PT LONG TERM GOAL #1   Title  Pt will be Ind c a final HEP to improve bowel function    Time  8    Period  Weeks    Status  New    Target Date  08/07/19      PT LONG TERM GOAL #2   Title  Pt will demonstrate understanding of postures and activities which have  positive and negative affects on her pain.    Time  8    Period  Weeks    Status  New    Target Date  08/07/19      PT LONG TERM GOAL #3   Title  Pt will report a pain range of 0-3/10 with functional activities such as eating and BM    Baseline  5-10/10    Time  8    Period  Weeks    Status  New    Target Date  08/07/19      PT LONG TERM GOAL #4   Title  Pt will demonstrate pelvic floor strength of 3/5 and able to hold for at least 15 seconds for improved intra-abdominal pressure during activities such as walking    Time  8    Period  Weeks    Status  New    Target Date  08/07/19      PT LONG TERM GOAL #5   Title  Pt will be able to have BM and empty bladder with minimal to no bearing down for reduced stress on her GI tract.    Time  8    Period  Weeks    Status  New    Target Date  08/07/19             Plan - 06/12/19 1053    Clinical Impression Statement  Pt presents to clinic due to constipation that she has struggled with since a rapid weight loss surgery she had in 2017.  Pt demosntrates tight lumbar and decreased ROM of hamstirngs and hip rotation. She has high tone of her pelivc floor.  Weakness 3/5 MMT of pelvic floor with only holding for 4 sec .  Pt has decreased coordination and unable to expel from rectum. Pt has several abdominal surgeries and incisions.  She has some fascial restrictions throughout abdomen and decreased tone in abdominal wall.  TTP from umbilicus to xyphoid process. She has decreased ROM as noted and posture abnormalities.  Pt will benefit from skilled PT to address impairments and return to maximum function.  Personal Factors and Comorbidities  Comorbidity 3+    Comorbidities  appendix removal; c-section x 2; small bowel resection; tubal ligation    Examination-Activity Limitations  Bed Mobility;Bathing;Lift;Bend;Carry;Dressing;Sleep;Reach Overhead;Stand;Sit;Transfers;Toileting;Continence    Examination-Participation Restrictions   Laundry;Community Activity;Yard Work;Shop;Interpersonal Relationship    Stability/Clinical Decision Making  Evolving/Moderate complexity    Clinical Decision Making  Moderate    Rehab Potential  Excellent    PT Frequency  2x / week    PT Duration  8 weeks    PT Treatment/Interventions  ADLs/Self Care Home Management;Electrical Stimulation;Iontophoresis 4mg /ml Dexamethasone;Traction;Moist Heat;Ultrasound;Gait training;Functional mobility training;Balance training;Therapeutic exercise;Therapeutic activities;Patient/family education;Manual techniques;Passive range of motion;Dry needling;Taping;Energy conservation;Biofeedback;Cryotherapy;Neuromuscular re-education    PT Next Visit Plan  review intial info given; abdominal fascial release; ribcage mobility; biofeedback as needed; lumbar and hip rotation stretch and ROM; core strength    PT Home Exercise Plan  [id number]; added [id number] for pelvic floor    Consulted and Agree with Plan of Care  Patient       Patient will benefit from skilled therapeutic intervention in order to improve the following deficits and impairments:  Improper body mechanics, Pain, Decreased mobility, Decreased activity tolerance, Decreased range of motion, Impaired UE functional use, Decreased balance, Decreased coordination, Increased fascial restricitons, Impaired tone, Decreased strength  Visit Diagnosis: Unspecified lack of coordination - Plan: PT plan of care cert/re-cert  Muscle weakness (generalized) - Plan: PT plan of care cert/re-cert  Cramp and spasm - Plan: PT plan of care cert/re-cert     Problem List Patient Active Problem List   Diagnosis Date Noted  . Essential hypertension, benign 05/30/2019  . 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 History of fall 04/10/2019  . Spondylosis, cervical, with myelopathy 03/17/2019  . 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 Low back pain 03/17/2019  . Non-suppurative otitis media  03/17/2019  . Injury of jawline 03/17/2019  . Disorder of oral soft tissue 03/17/2019  . Reactive depression 06/13/2017  . Chronic pain syndrome 06/13/2017  . Polypharmacy 06/13/2017  . Empty sella syndrome ([place]) 05/01/2017  . Adjustment disorder with mixed anxiety and depressed mood 10/13/2016  . 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 Hypokalemia 02/22/2016  . Constipation, chronic 02/22/2016  . Memory loss due to medical condition 12/14/2015  . Chronic fatigue 12/14/2015  . Recurrent dehydration 09/30/2015  . Chronic nausea 08/23/2015  . Abdominal pain, chronic, epigastric 08/20/2015  . Protein-calorie malnutrition, moderate ([place]) 08/19/2015  . Mild obstructive sleep apnea 07/08/2015  . Dyslipidemia 07/08/2015  . S/P gastric bypass 07/08/2015  . IIH (idiopathic intracranial hypertension) 06/03/2015  . Vision changes 04/30/2015  . Perceived hearing changes 04/30/2015  . Chronic daily headache 04/21/2014  . Liver lesion 03/21/2013  . Splenic cyst 03/21/2013    Jule Ser, PT 06/12/2019, 12:41 PM  [facility] [address], [place], [place], [address] Phone: [phone]   Fax:  [phone]  Name: Margaret Fletcher MRN: [id number] Date of Birth: 11-May-1970

## 2019-06-13 MED ORDER — ONDANSETRON 4 MG PO TBDP: 4.0000 mg | ORAL_TABLET | Freq: Three times a day (TID) | ORAL | 5 refills | Status: AC | PRN

## 2019-06-13 NOTE — Telephone Encounter (Signed)
zofran faxed to [facility]. Zykia informed that this has been done, confirmed pharmacy prior to faxing.

## 2019-06-13 NOTE — Telephone Encounter (Signed)
Yes x 6

## 2019-06-14 MED FILL — ONDANSETRON ODT 4 MG TABLET: 4 | 6 days supply | Qty: 20 | Fill #0

## 2019-06-15 ENCOUNTER — Ambulatory Visit
Admission: RE | Admit: 2019-06-15 | Discharge: 2019-06-15 | Disposition: A | Discharge status: Home / Self Care | Payer: Medicare HMO | Source: Ambulatory Visit | Attending: Family Medicine | Admitting: Family Medicine

## 2019-06-15 ENCOUNTER — Other Ambulatory Visit: Payer: Self-pay | Admitting: Family Medicine

## 2019-06-15 ENCOUNTER — Other Ambulatory Visit: Payer: Self-pay

## 2019-06-15 ENCOUNTER — Ambulatory Visit: Payer: Medicare HMO | Admitting: Physical Therapy

## 2019-06-15 DIAGNOSIS — M6281 Muscle weakness (generalized): Secondary | ICD-10-CM

## 2019-06-15 DIAGNOSIS — R928 Other abnormal and inconclusive findings on diagnostic imaging of breast: Secondary | ICD-10-CM | POA: Diagnosis not present

## 2019-06-15 DIAGNOSIS — R279 Unspecified lack of coordination: Secondary | ICD-10-CM | POA: Diagnosis not present

## 2019-06-15 DIAGNOSIS — N6320 Unspecified lump in the left breast, unspecified quadrant: Secondary | ICD-10-CM

## 2019-06-15 DIAGNOSIS — N6310 Unspecified lump in the right breast, unspecified quadrant: Secondary | ICD-10-CM

## 2019-06-15 DIAGNOSIS — R252 Cramp and spasm: Secondary | ICD-10-CM

## 2019-06-15 DIAGNOSIS — N6011 Diffuse cystic mastopathy of right breast: Secondary | ICD-10-CM | POA: Diagnosis not present

## 2019-06-15 NOTE — Therapy (Signed)
[facility] [address] [place], [place], [address] Phone: [phone]   Fax:  [phone]  Physical Therapy Treatment  Patient Details  Name: Margaret Fletcher MRN: [id number] Date of Birth: [birth year] Referring Provider (PT): Gatha Mayer, MD   Encounter Date: 06/15/2019  PT End of Session - 06/15/19 1727    Visit Number  2    Date for PT Re-Evaluation  08/07/19    Authorization Type  [employer]    PT Start Time  [id number]    PT Stop Time  1657    PT Time Calculation (min)  40 min    Activity Tolerance  Patient limited by pain    Behavior During Therapy  [facility] for tasks assessed/performed       Past Medical History:  Diagnosis Date  . Adie's pupil, left   . Alopecia   . Anxiety   . Autoimmune disease ([place])    "Alopecia"  . Bacterial vaginosis   . Cyst of spleen   . Depression   . Disorder of oral soft tissue 03/17/2019  . Frequent UTI   . Hypertension   . Hypertrophy of breast   . IIH (idiopathic intracranial hypertension) 2017   "related to headaches"  . Injury of jawline 03/17/2019  . Migraine headache    denies, ruled out - Propranolol.Using for headaches  . Non-suppurative otitis media 03/17/2019    Past Surgical History:  Procedure Laterality Date  . ANAL RECTAL MANOMETRY N/A 06/02/2019   Procedure: ANO RECTAL MANOMETRY;  Surgeon: Gatha Mayer, MD;  [facility];  Service: Endoscopy;  Laterality: N/A;  . APPENDECTOMY    . BREAST EXCISIONAL BIOPSY Right 2017   removed at time of reduction  . BREAST REDUCTION SURGERY Bilateral 03/26/2015   Procedure: BILATERAL BREAST REDUCTION  ;  Surgeon: Youlanda Roys, MD;  Location: [place];  Service: Plastics;  Laterality: Bilateral;  . BREATH TEK H PYLORI N/A 05/21/2014   Procedure: BREATH TEK H PYLORI;  Surgeon: Greer Pickerel, MD;  Location[facility];  Service: General;  Laterality: N/A;  . CELIAC PLEXUS BLOCK   07/18/2018   Per patient it was done at [place]    . CESAREAN SECTION    . CHOLECYSTECTOMY    . DILITATION & CURRETTAGE/HYSTROSCOPY WITH NOVASURE ABLATION N/A 07/19/2012   Procedure: HYSTEROSCOPY WITH NOVASURE ABLATION;  Surgeon: Farrel Gobble. Harrington Challenger, MD;  Location: [facility];  Service: Gynecology;  Laterality: N/A;  . ENDOMETRIAL ABLATION    . ESOPHAGOGASTRODUODENOSCOPY N/A 08/16/2015   Procedure: UPPER ESOPHAGOGASTRODUODENOSCOPY (EGD);  Surgeon: Greer Pickerel, MD;  Location: [facility];  Service: General;  Laterality: N/A;  . ESOPHAGOGASTRODUODENOSCOPY N/A 02/21/2016   Procedure: ESOPHAGOGASTRODUODENOSCOPY (EGD);  Surgeon: Greer Pickerel, MD;  Location: [facility];  Service: General;  Laterality: N/A;  . ESOPHAGOGASTRODUODENOSCOPY N/A 02/24/2017   Procedure: ESOPHAGOGASTRODUODENOSCOPY (EGD);  Surgeon: Greer Pickerel, MD;  [facility]: [facility];  Service: General;  Laterality: N/A;  . IR GENERIC HISTORICAL  10/04/2015   IR [place] Vivianne Master 10/04/2015 Markus Daft, MD [facility]  . IR GENERIC HISTORICAL  01/31/2016   IR [place] GASTRO/COLONIC TUBE PERCUT W/FLUORO 01/31/2016 Corrie Mckusick, DO [facility]  . IR GENERIC HISTORICAL  04/06/2016   IR [place] TUBE PERCUT W/FLUORO 04/06/2016 Arne Cleveland, MD [facility]  . IR GENERIC HISTORICAL  05/14/2016   [place] GASTRIC TUBE PERC CHG W/O IMG GUIDE 05/14/2016 Sandi Mariscal, MD [facility]  .  LAPAROSCOPIC GASTROSTOMY N/A 09/30/2015   Procedure: LAPAROSCOPIC GASTROSTOMY TUBE PLACEMENT;  Surgeon: Greer Pickerel, MD;  [facility];  Service: General;  Laterality: N/A;  . LAPAROSCOPIC ROUX-EN-Y GASTRIC BYPASS WITH HIATAL HERNIA REPAIR N/A 07/08/2015   Procedure: LAPAROSCOPIC ROUX-EN-Y GASTRIC BYPASS WITH HIATAL HERNIA REPAIR WITH UPPER ENDOSCOPY;  Surgeon: Greer Pickerel, MD;  [facility];  Service: General;  Laterality: N/A;  . LAPAROSCOPIC SMALL BOWEL RESECTION N/A 09/30/2015   Procedure: LAPAROSCOPIC REVISION  OF ROUX LIMB;  Surgeon: Greer Pickerel, MD;  [facility];  Service: General;  Laterality: N/A;  . LAPAROSCOPY N/A 09/30/2015   Procedure: LAPAROSCOPY DIAGNOSTIC;  Surgeon: Greer Pickerel, MD;  [facility];  Service: General;  Laterality: N/A;  . REDUCTION MAMMAPLASTY Bilateral 2017  . TUBAL LIGATION    . WISDOM TOOTH EXTRACTION      There were no vitals filed for this visit.  Subjective Assessment - 06/15/19 1733    Subjective  I did the breathing on the toilet.  Just practicing.    Patient Stated Goals  decreased pain overall, able to have a BM    Currently in Pain?  Yes    Pain Score  5     Pain Location  Abdomen    Pain Descriptors / Indicators  Aching    Pain Type  Chronic pain    Pain Onset  More than a month ago    Pain Frequency  Constant    Multiple Pain Sites  No                       [facility] Adult PT Treatment/Exercise - 06/15/19 0001      Neuro Re-ed    Neuro Re-ed Details   TrA activation in supine - cues to bring ribcage to pelvis and make s sound; reviewed diaphragmatic breathing      Manual Therapy   Manual Therapy  Myofascial release    Myofascial Release  fascial release throughout abdomen and ribcage bilaterally               PT Short Term Goals - 06/12/19 1048      PT SHORT TERM GOAL #1   Title  Pt will be Ind c an initial HEP and toileting techniques    Time  3    Period  Weeks    Status  New    Target Date  07/03/19        PT Long Term Goals - 06/12/19 1048      PT LONG TERM GOAL #1   Title  Pt will be Ind c a final HEP to improve bowel function    Time  8    Period  Weeks    Status  New    Target Date  08/07/19      PT LONG TERM GOAL #2   Title  Pt will demonstrate understanding of postures and activities which have positive and negative affects on her pain.    Time  8    Period  Weeks    Status  New    Target Date  08/07/19      PT LONG TERM GOAL #3   Title  Pt will report a pain range of 0-3/10 with  functional activities such as eating and BM    Baseline  5-10/10    Time  8    Period  Weeks    Status  New    Target Date  08/07/19  PT LONG TERM GOAL #4   Title  Pt will demonstrate pelvic floor strength of 3/5 and able to hold for at least 15 seconds for improved intra-abdominal pressure during activities such as walking    Time  8    Period  Weeks    Status  New    Target Date  08/07/19      PT LONG TERM GOAL #5   Title  Pt will be able to have BM and empty bladder with minimal to no bearing down for reduced stress on her GI tract.    Time  8    Period  Weeks    Status  New    Target Date  08/07/19            Plan - 06/15/19 1719    Clinical Impression Statement  Pt had good fascial release throughout abdomen.  She was able to get greater ribcage movement and activating TrA with cues to make "s" sound.  Pt had a few bowel vibrations palpated during the abdominal massage of lower abdomen.  She will benefit from skilled PT to work on improved core strength and fascial release for pain management.    PT Treatment/Interventions  ADLs/Self Care Home Management;Electrical Stimulation;Iontophoresis 4mg /ml Dexamethasone;Traction;Moist Heat;Ultrasound;Gait training;Functional mobility training;Balance training;Therapeutic exercise;Therapeutic activities;Patient/family education;Manual techniques;Passive range of motion;Dry needling;Taping;Energy conservation;Biofeedback;Cryotherapy;Neuromuscular re-education    PT Next Visit Plan  review intial info given; abdominal fascial release; ribcage mobility; biofeedback as needed; lumbar and hip rotation stretch and ROM; core strength    PT Home Exercise Plan  [id number]; added [id number] for pelvic floor    Consulted and Agree with Plan of Care  Patient       Patient will benefit from skilled therapeutic intervention in order to improve the following deficits and impairments:  Improper body mechanics, Pain, Decreased mobility, Decreased  activity tolerance, Decreased range of motion, Impaired UE functional use, Decreased balance, Decreased coordination, Increased fascial restricitons, Impaired tone, Decreased strength  Visit Diagnosis: Unspecified lack of coordination  Muscle weakness (generalized)  Cramp and spasm     Problem List Patient Active Problem List   Diagnosis Date Noted  . Essential hypertension, benign 05/30/2019  . 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 History of fall 04/10/2019  . Spondylosis, cervical, with myelopathy 03/17/2019  . 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 Low back pain 03/17/2019  . Non-suppurative otitis media 03/17/2019  . Injury of jawline 03/17/2019  . Disorder of oral soft tissue 03/17/2019  . Reactive depression 06/13/2017  . Chronic pain syndrome 06/13/2017  . Polypharmacy 06/13/2017  . Empty sella syndrome ([place]) 05/01/2017  . Adjustment disorder with mixed anxiety and depressed mood 10/13/2016  . 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 Hypokalemia 02/22/2016  . Constipation, chronic 02/22/2016  . Memory loss due to medical condition 12/14/2015  . Chronic fatigue 12/14/2015  . Recurrent dehydration 09/30/2015  . Chronic nausea 08/23/2015  . Abdominal pain, chronic, epigastric 08/20/2015  . Protein-calorie malnutrition, moderate ([place]) 08/19/2015  . Mild obstructive sleep apnea 07/08/2015  . Dyslipidemia 07/08/2015  . S/P gastric bypass 07/08/2015  . IIH (idiopathic intracranial hypertension) 06/03/2015  . Vision changes 04/30/2015  . Perceived hearing changes 04/30/2015  . Chronic daily headache 04/21/2014  . Liver lesion 03/21/2013  . Splenic cyst 03/21/2013    Jule Ser, PT 06/15/2019, 5:34 PM  [facility] [address] [place], [place], [address] Phone: [phone]   Fax:  [phone]  Name: Margaret Fletcher MRN: [id number] Date of Birth:  21-Jul-1970

## 2019-06-16 ENCOUNTER — Ambulatory Visit: Payer: Medicare HMO | Admitting: Adult Health Nurse Practitioner

## 2019-06-19 ENCOUNTER — Ambulatory Visit: Payer: Medicare HMO | Admitting: Registered Nurse

## 2019-06-19 ENCOUNTER — Encounter: Payer: Self-pay | Admitting: Adult Health Nurse Practitioner

## 2019-06-19 DIAGNOSIS — K5902 Outlet dysfunction constipation: Secondary | ICD-10-CM

## 2019-06-20 ENCOUNTER — Ambulatory Visit: Payer: Medicare HMO | Admitting: Physical Therapy

## 2019-06-20 ENCOUNTER — Telehealth: Payer: Self-pay | Admitting: Internal Medicine

## 2019-06-20 ENCOUNTER — Other Ambulatory Visit: Payer: Self-pay

## 2019-06-20 ENCOUNTER — Encounter: Payer: Self-pay | Admitting: Adult Health Nurse Practitioner

## 2019-06-20 ENCOUNTER — Ambulatory Visit (INDEPENDENT_AMBULATORY_CARE_PROVIDER_SITE_OTHER): Payer: Medicare HMO | Admitting: Adult Health Nurse Practitioner

## 2019-06-20 VITALS — BP 125/73 | HR 67 | Temp 97.6°F | Ht 68.0 in | Wt 189.0 lb

## 2019-06-20 DIAGNOSIS — N8189 Other female genital prolapse: Secondary | ICD-10-CM | POA: Diagnosis not present

## 2019-06-20 DIAGNOSIS — K5909 Other constipation: Secondary | ICD-10-CM | POA: Diagnosis not present

## 2019-06-20 DIAGNOSIS — R350 Frequency of micturition: Secondary | ICD-10-CM | POA: Diagnosis not present

## 2019-06-20 DIAGNOSIS — M6281 Muscle weakness (generalized): Secondary | ICD-10-CM

## 2019-06-20 DIAGNOSIS — R252 Cramp and spasm: Secondary | ICD-10-CM | POA: Diagnosis not present

## 2019-06-20 DIAGNOSIS — K5902 Outlet dysfunction constipation: Secondary | ICD-10-CM | POA: Diagnosis not present

## 2019-06-20 DIAGNOSIS — R279 Unspecified lack of coordination: Secondary | ICD-10-CM | POA: Diagnosis not present

## 2019-06-20 DIAGNOSIS — R3915 Urgency of urination: Secondary | ICD-10-CM | POA: Diagnosis not present

## 2019-06-20 LAB — POCT URINALYSIS DIP (MANUAL ENTRY)
Bilirubin, UA: NEGATIVE
Blood, UA: NEGATIVE
Glucose, UA: NEGATIVE mg/dL
Ketones, POC UA: NEGATIVE mg/dL
Leukocytes, UA: NEGATIVE
Nitrite, UA: NEGATIVE
Protein Ur, POC: NEGATIVE mg/dL
Spec Grav, UA: 1.025 (ref 1.010–1.025)
Urobilinogen, UA: 1 E.U./dL
pH, UA: 7 (ref 5.0–8.0)

## 2019-06-20 NOTE — Telephone Encounter (Signed)
Do not know what else to try in her situation sorry to say  Am trying to help with the constipation - she is doing PT - the notes from today say she had a small BM without an enema for first time?  We can see if the Mote[employer] samples helped and try to provide more if they did.  She has had abdominal pain ever since bariatric surgery - the best I could do is have her see someone at one of the universities to see if they can help - I had discussed w/ Dr. Redmond Pulling of surgery and he mentioned he had offered referral to [facility] or [facility] in the past. I think he was going to send her to a bariatric surgery clinic. Am willing to refer to teriary GI vs bariatric surgeon- I do not know what else to do for this chronic abdominal pain.  My hope was that after PT is done hopefully she could actually complete a bowel prep and get colonoscopy done but that is a separate issue from the pain

## 2019-06-20 NOTE — Patient Instructions (Signed)

## 2019-06-20 NOTE — Progress Notes (Signed)
   06/20/2019  Semmes 02/22/1971 [id number]      SUBJECTIVE: Margaret Fletcher is a 49 y.o. female who complains of urinary frequency, urgency and dysuria x 1 week. without flank pain, fever, chills, or abnormal vaginal discharge or bleeding.  Recently had anal mamometry which was abnormal and she is getting pelvic floor rehab for that.  She was told that symptoms of needing to have a bowel mvmt when she has muscles that are weakened in this area can have the same feeling for urinary symptoms.   OBJECTIVE: Appears well, in no apparent distress.  Vital signs are normal. The abdomen is soft without tenderness, guarding, mass, rebound or organomegaly. No CVA tenderness or inguinal adenopathy noted. Urine dipstick shows negative for all components.  Micro exam: not done.   ASSESSMENT: Weakened pelvicfloor.  Urinary urgency.   PLAN: Treatment per orders - also push fluids, may use Pyridium OTC prn. Call or return to clinic prn if these symptoms worsen or fail to improve as anticipated.  Patient is inline with this plan.   Glyn Ade, NP

## 2019-06-20 NOTE — Therapy (Signed)
[facility] [address], [place], [place], [address] Phone: [phone]   Fax:  [phone]  Physical Therapy Treatment  Patient Details  Name: Margaret Fletcher MRN: [id number] Date of Birth: 1970-03-15 Referring Provider (PT): Gatha Mayer, MD   Encounter Date: 06/20/2019  PT End of Session - 06/20/19 0821    Visit Number  3    Date for PT Re-Evaluation  08/07/19    Authorization Type  [employer]    PT Start Time  0817    PT Stop Time  0845    PT Time Calculation (min)  28 min       Past Medical History:  Diagnosis Date  . Adie's pupil, left   . Alopecia   . Anxiety   . Autoimmune disease ([place])    "Alopecia"  . Bacterial vaginosis   . Cyst of spleen   . Depression   . Disorder of oral soft tissue 03/17/2019  . Frequent UTI   . Hypertension   . Hypertrophy of breast   . IIH (idiopathic intracranial hypertension) 2017   "related to headaches"  . Injury of jawline 03/17/2019  . Migraine headache    denies, ruled out - Propranolol.Using for headaches  . Non-suppurative otitis media 03/17/2019    Past Surgical History:  Procedure Laterality Date  . ANAL RECTAL MANOMETRY N/A 06/02/2019   Procedure: ANO RECTAL MANOMETRY;  Surgeon: Gatha Mayer, MD;  [facility];  Service: Endoscopy;  Laterality: N/A;  . APPENDECTOMY    . BREAST EXCISIONAL BIOPSY Right 2017   removed at time of reduction  . BREAST REDUCTION SURGERY Bilateral 03/26/2015   Procedure: BILATERAL BREAST REDUCTION  ;  Surgeon: Youlanda Roys, MD;  Location: [place];  Service: Plastics;  Laterality: Bilateral;  . BREATH TEK H PYLORI N/A 05/21/2014   Procedure: BREATH TEK H PYLORI;  Surgeon: Greer Pickerel, MD;  [facility];  Service: General;  Laterality: N/A;  . CELIAC PLEXUS BLOCK  07/18/2018   Per patient it was done at [place]    . CESAREAN SECTION    .  CHOLECYSTECTOMY    . DILITATION & CURRETTAGE/HYSTROSCOPY WITH NOVASURE ABLATION N/A 07/19/2012   Procedure: HYSTEROSCOPY WITH NOVASURE ABLATION;  Surgeon: Farrel Gobble. Harrington Challenger, MD;  Location: [facility];  Service: Gynecology;  Laterality: N/A;  . ENDOMETRIAL ABLATION    . ESOPHAGOGASTRODUODENOSCOPY N/A 08/16/2015   Procedure: UPPER ESOPHAGOGASTRODUODENOSCOPY (EGD);  Surgeon: Greer Pickerel, MD;  [facility]: [facility];  Service: General;  Laterality: N/A;  . ESOPHAGOGASTRODUODENOSCOPY N/A 02/21/2016   Procedure: ESOPHAGOGASTRODUODENOSCOPY (EGD);  Surgeon: Greer Pickerel, MD;  [facility]: [facility];  Service: General;  Laterality: N/A;  . ESOPHAGOGASTRODUODENOSCOPY N/A 02/24/2017   Procedure: ESOPHAGOGASTRODUODENOSCOPY (EGD);  Surgeon: Greer Pickerel, MD;  [facility];  Service: General;  Laterality: N/A;  . IR GENERIC HISTORICAL  10/04/2015   IR [place] Vivianne Master 10/04/2015 Markus Daft, MD [facility]  . IR GENERIC HISTORICAL  01/31/2016   IR [place] GASTRO/COLONIC TUBE PERCUT W/FLUORO 01/31/2016 Corrie Mckusick, DO [facility]  . IR GENERIC HISTORICAL  04/06/2016   IR [place] TUBE PERCUT W/FLUORO 04/06/2016 Arne Cleveland, MD [facility]  . IR GENERIC HISTORICAL  05/14/2016   [place] GASTRIC TUBE PERC CHG W/O IMG GUIDE 05/14/2016 Sandi Mariscal, MD [facility]  . LAPAROSCOPIC GASTROSTOMY N/A 09/30/2015   Procedure: LAPAROSCOPIC GASTROSTOMY TUBE PLACEMENT;  Surgeon: Greer Pickerel, MD;  [facility];  Service: General;  Laterality: N/A;  . LAPAROSCOPIC ROUX-EN-Y GASTRIC BYPASS WITH HIATAL HERNIA REPAIR N/A 07/08/2015   Procedure: LAPAROSCOPIC ROUX-EN-Y GASTRIC BYPASS WITH HIATAL HERNIA REPAIR WITH UPPER ENDOSCOPY;  Surgeon: Greer Pickerel, MD;  [facility];  Service: General;  Laterality: N/A;  . LAPAROSCOPIC SMALL BOWEL RESECTION N/A 09/30/2015   Procedure: LAPAROSCOPIC REVISION OF ROUX LIMB;  Surgeon: Greer Pickerel, MD;  [facility];  Service: General;  Laterality: N/A;  .  LAPAROSCOPY N/A 09/30/2015   Procedure: LAPAROSCOPY DIAGNOSTIC;  Surgeon: Greer Pickerel, MD;  [facility];  Service: General;  Laterality: N/A;  . REDUCTION MAMMAPLASTY Bilateral 2017  . TUBAL LIGATION    . WISDOM TOOTH EXTRACTION      There were no vitals filed for this visit.  Subjective Assessment - 06/20/19 0820    Subjective  I had a little BM and I am really nauseus afterwards now    Patient Stated Goals  decreased pain overall, able to have a BM    Currently in Pain?  Yes                       [facility] Adult PT Treatment/Exercise - 06/20/19 0001      Exercises   Exercises  Lumbar      Lumbar Exercises: Sidelying   Other Sidelying Lumbar Exercises  sidelying thoracic rotation with breathing      Manual Therapy   Myofascial Release  fascial release with cupping along the side with sidelying rotation - TTP more on Lt side               PT Short Term Goals - 06/20/19 0935      PT SHORT TERM GOAL #1   Title  Pt will be Ind c an initial HEP and toileting techniques    Baseline  used toileting techniques successfully today    Status  Achieved        PT Long Term Goals - 06/12/19 1048      PT LONG TERM GOAL #1   Title  Pt will be Ind c a final HEP to improve bowel function    Time  8    Period  Weeks    Status  New    Target Date  08/07/19      PT LONG TERM GOAL #2   Title  Pt will demonstrate understanding of postures and activities which have positive and negative affects on her pain.    Time  8    Period  Weeks    Status  New    Target Date  08/07/19      PT LONG TERM GOAL #3   Title  Pt will report a pain range of 0-3/10 with functional activities such as eating and BM    Baseline  5-10/10    Time  8    Period  Weeks    Status  New    Target Date  08/07/19      PT LONG TERM GOAL #4   Title  Pt will demonstrate pelvic floor strength of 3/5 and able to hold for at least 15 seconds for improved intra-abdominal pressure during  activities such as walking    Time  8    Period  Weeks    Status  New    Target Date  08/07/19      PT LONG TERM GOAL #5   Title  Pt will be able to have BM and empty  bladder with minimal to no bearing down for reduced stress on her GI tract.    Time  8    Period  Weeks    Status  New    Target Date  08/07/19            Plan - 06/20/19 0844    Clinical Impression Statement  Pt arrived late due to nausea this morning.  She was able to have a BM without an enema for the first time in a while.  Today's session focused on thoracic rotation and fascial release around ribcage and diaphragm. Pt hasvery tender fascial Lt>Rt.  Pt is making progress towards goals at this time.    Examination-Activity Limitations  Bed Mobility;Bathing;Lift;Bend;Carry;Dressing;Sleep;Reach Overhead;Stand;Sit;Transfers;Toileting;Continence    PT Treatment/Interventions  ADLs/Self Care Home Management;Electrical Stimulation;Iontophoresis 4mg /ml Dexamethasone;Traction;Moist Heat;Ultrasound;Gait training;Functional mobility training;Balance training;Therapeutic exercise;Therapeutic activities;Patient/family education;Manual techniques;Passive range of motion;Dry needling;Taping;Energy conservation;Biofeedback;Cryotherapy;Neuromuscular re-education    PT Next Visit Plan  f/u on thoracic and ribcage mobility; biofeedback, lumbar and hip rotation; core strength    PT Home Exercise Plan  [id number]; added [id number] for pelvic floor    Consulted and Agree with Plan of Care  Patient       Patient will benefit from skilled therapeutic intervention in order to improve the following deficits and impairments:  Improper body mechanics, Pain, Decreased mobility, Decreased activity tolerance, Decreased range of motion, Impaired UE functional use, Decreased balance, Decreased coordination, Increased fascial restricitons, Impaired tone, Decreased strength  Visit Diagnosis: Unspecified lack of coordination  Muscle weakness  (generalized)  Cramp and spasm     Problem List Patient Active Problem List   Diagnosis Date Noted  . Constipation due to outlet dysfunction   . Essential hypertension, benign 05/30/2019  . 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 History of fall 04/10/2019  . Spondylosis, cervical, with myelopathy 03/17/2019  . 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 Low back pain 03/17/2019  . Non-suppurative otitis media 03/17/2019  . Injury of jawline 03/17/2019  . Disorder of oral soft tissue 03/17/2019  . Reactive depression 06/13/2017  . Chronic pain syndrome 06/13/2017  . Polypharmacy 06/13/2017  . Empty sella syndrome ([place]) 05/01/2017  . Adjustment disorder with mixed anxiety and depressed mood 10/13/2016  . 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 Hypokalemia 02/22/2016  . Constipation, chronic 02/22/2016  . Memory loss due to medical condition 12/14/2015  . Chronic fatigue 12/14/2015  . Recurrent dehydration 09/30/2015  . Chronic nausea 08/23/2015  . Abdominal pain, chronic, epigastric 08/20/2015  . Protein-calorie malnutrition, moderate ([place]) 08/19/2015  . Mild obstructive sleep apnea 07/08/2015  . Dyslipidemia 07/08/2015  . S/P gastric bypass 07/08/2015  . IIH (idiopathic intracranial hypertension) 06/03/2015  . Vision changes 04/30/2015  . Perceived hearing changes 04/30/2015  . Chronic daily headache 04/21/2014  . Liver lesion 03/21/2013  . Splenic cyst 03/21/2013    Jule Ser, PT 06/20/2019, 9:39 AM  [facility] [address] [place], [place], [address] Phone: [phone]   Fax:  [phone]  Name: Ambreal Balthrop MRN: [id number] Date of Birth: May 27, 1970

## 2019-06-20 NOTE — Telephone Encounter (Signed)
Patient reports upper abdominal pain.  Above " above belly button and below rib cage".  She is asking what she can do for the abdominal pain.  She did not have a BM this am.  She has tried dietary changes with no improvement.  She states that the belbuca is not helping her pain.

## 2019-06-20 NOTE — Telephone Encounter (Signed)
Patient notified of the response.  She will think about it and call back if she is interested in the referrals. She thanked me for the call

## 2019-06-21 ENCOUNTER — Emergency Department (HOSPITAL_BASED_OUTPATIENT_CLINIC_OR_DEPARTMENT_OTHER): Payer: Medicare HMO

## 2019-06-21 ENCOUNTER — Emergency Department (HOSPITAL_BASED_OUTPATIENT_CLINIC_OR_DEPARTMENT_OTHER)
Admission: EM | Admit: 2019-06-21 | Discharge: 2019-06-21 | Disposition: A | Discharge status: Home / Self Care | Payer: Medicare HMO | Attending: Emergency Medicine | Admitting: Emergency Medicine

## 2019-06-21 ENCOUNTER — Encounter (HOSPITAL_BASED_OUTPATIENT_CLINIC_OR_DEPARTMENT_OTHER): Payer: Self-pay | Admitting: Emergency Medicine

## 2019-06-21 ENCOUNTER — Other Ambulatory Visit: Payer: Self-pay

## 2019-06-21 DIAGNOSIS — Z79899 Other long term (current) drug therapy: Secondary | ICD-10-CM | POA: Diagnosis not present

## 2019-06-21 DIAGNOSIS — R1010 Upper abdominal pain, unspecified: Secondary | ICD-10-CM | POA: Diagnosis not present

## 2019-06-21 DIAGNOSIS — R1110 Vomiting, unspecified: Secondary | ICD-10-CM | POA: Diagnosis not present

## 2019-06-21 DIAGNOSIS — I10 Essential (primary) hypertension: Secondary | ICD-10-CM | POA: Diagnosis not present

## 2019-06-21 DIAGNOSIS — R1012 Left upper quadrant pain: Secondary | ICD-10-CM | POA: Insufficient documentation

## 2019-06-21 DIAGNOSIS — R1011 Right upper quadrant pain: Secondary | ICD-10-CM | POA: Diagnosis not present

## 2019-06-21 DIAGNOSIS — R109 Unspecified abdominal pain: Secondary | ICD-10-CM

## 2019-06-21 DIAGNOSIS — E876 Hypokalemia: Secondary | ICD-10-CM | POA: Diagnosis not present

## 2019-06-21 DIAGNOSIS — R1030 Lower abdominal pain, unspecified: Secondary | ICD-10-CM | POA: Diagnosis not present

## 2019-06-21 LAB — COMPREHENSIVE METABOLIC PANEL
ALT: 18 U/L (ref 0–44)
AST: 29 U/L (ref 15–41)
Albumin: 3.5 g/dL (ref 3.5–5.0)
Alkaline Phosphatase: 71 U/L (ref 38–126)
Anion gap: 9 (ref 5–15)
BUN: 24 mg/dL — ABNORMAL HIGH (ref 6–20)
CO2: 24 mmol/L (ref 22–32)
Calcium: 8.5 mg/dL — ABNORMAL LOW (ref 8.9–10.3)
Chloride: 106 mmol/L (ref 98–111)
Creatinine, Ser: 1.31 mg/dL — ABNORMAL HIGH (ref 0.44–1.00)
GFR calc Af Amer: 55 mL/min — ABNORMAL LOW (ref >60)
GFR calc non Af Amer: 48 mL/min — ABNORMAL LOW (ref >60)
Glucose, Bld: 91 mg/dL (ref 70–99)
Potassium: 3.2 mmol/L — ABNORMAL LOW (ref 3.5–5.1)
Sodium: 139 mmol/L (ref 135–145)
Total Bilirubin: 0.3 mg/dL (ref 0.3–1.2)
Total Protein: 6.4 g/dL — ABNORMAL LOW (ref 6.5–8.1)

## 2019-06-21 LAB — PREGNANCY, URINE: Preg Test, Ur: NEGATIVE

## 2019-06-21 LAB — CBC WITH DIFFERENTIAL/PLATELET
Abs Immature Granulocytes: 0.01 10*3/uL (ref 0.00–0.07)
Basophils Absolute: 0.1 10*3/uL (ref 0.0–0.1)
Basophils Relative: 2 %
Eosinophils Absolute: 0.4 10*3/uL (ref 0.0–0.5)
Eosinophils Relative: 7 %
HCT: 36 % (ref 36.0–46.0)
Hemoglobin: 11.5 g/dL — ABNORMAL LOW (ref 12.0–15.0)
Immature Granulocytes: 0 %
Lymphocytes Relative: 32 %
Lymphs Abs: 1.8 10*3/uL (ref 0.7–4.0)
MCH: 29.7 pg (ref 26.0–34.0)
MCHC: 31.9 g/dL (ref 30.0–36.0)
MCV: 93 fL (ref 80.0–100.0)
Monocytes Absolute: 0.5 10*3/uL (ref 0.1–1.0)
Monocytes Relative: 9 %
Neutro Abs: 2.9 10*3/uL (ref 1.7–7.7)
Neutrophils Relative %: 50 %
Platelets: 278 10*3/uL (ref 150–400)
RBC: 3.87 MIL/uL (ref 3.87–5.11)
RDW: 13.2 % (ref 11.5–15.5)
WBC: 5.7 10*3/uL (ref 4.0–10.5)
nRBC: 0 % (ref 0.0–0.2)

## 2019-06-21 LAB — URINALYSIS, ROUTINE W REFLEX MICROSCOPIC
Bilirubin Urine: NEGATIVE
Glucose, UA: NEGATIVE mg/dL
Hgb urine dipstick: NEGATIVE
Ketones, ur: NEGATIVE mg/dL
Leukocytes,Ua: NEGATIVE
Nitrite: NEGATIVE
Protein, ur: NEGATIVE mg/dL
Specific Gravity, Urine: 1.02 (ref 1.005–1.030)
pH: 6.5 (ref 5.0–8.0)

## 2019-06-21 MED ORDER — FENTANYL CITRATE (PF) 100 MCG/2ML IJ SOLN
50.0000 ug | Freq: Once | INTRAMUSCULAR | Status: AC
  Administered 2019-06-21: 05:00:00 50 ug via INTRAVENOUS
  Filled 2019-06-21: qty 2

## 2019-06-21 MED ORDER — DICYCLOMINE HCL 20 MG PO TABS: 20.0000 mg | ORAL_TABLET | Freq: Two times a day (BID) | ORAL | 0 refills | Status: DC

## 2019-06-21 MED ORDER — HALOPERIDOL LACTATE 5 MG/ML IJ SOLN
5.0000 mg | Freq: Once | INTRAMUSCULAR | Status: AC
  Administered 2019-06-21: 5 mg via INTRAVENOUS
  Filled 2019-06-21: qty 1

## 2019-06-21 MED ORDER — ONDANSETRON 8 MG PO TBDP: ORAL_TABLET | ORAL | 0 refills | Status: AC

## 2019-06-21 MED ORDER — KETOROLAC TROMETHAMINE 30 MG/ML IJ SOLN
30.0000 mg | Freq: Once | INTRAMUSCULAR | Status: AC
  Administered 2019-06-21: 04:00:00 30 mg via INTRAVENOUS
  Filled 2019-06-21: qty 1

## 2019-06-21 MED ORDER — POTASSIUM CHLORIDE CRYS ER 20 MEQ PO TBCR
40.0000 meq | EXTENDED_RELEASE_TABLET | Freq: Once | ORAL | Status: AC
  Administered 2019-06-21: 05:00:00 40 meq via ORAL
  Filled 2019-06-21: qty 2

## 2019-06-21 MED FILL — ONDANSETRON ODT 8 MG TABLET: 8 | 4 days supply | Qty: 10 | Fill #0

## 2019-06-21 MED FILL — DICYCLOMINE 20 MG TABLET: 20 | 10 days supply | Qty: 20 | Fill #0

## 2019-06-21 NOTE — ED Provider Notes (Signed)
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 statistically likely a mildly proteinaceous cyst. No other definite suspicious hepatic lesions are confidently identified on today's noncontrast CT examination. Status post cholecystectomy. Pancreas: No definite pancreatic mass or peripancreatic fluid collections or inflammatory changes are noted on today's noncontrast CT examination. Spleen: Unremarkable. Adrenals/Urinary Tract: There are no abnormal calcifications within the collecting system of either kidney, along the course of either ureter, or within the lumen of the urinary bladder. No hydroureteronephrosis or perinephric stranding to suggest urinary tract obstruction at this time. The unenhanced appearance of the kidneys is unremarkable bilaterally. Unenhanced  appearance of the urinary bladder is normal. Bilateral adrenal glands are normal in appearance. Stomach/Bowel: Status post Roux-en-Y gastric bypass. No pathologic dilatation of small bowel or colon. The appendix is not confidently identified and may be surgically absent. Regardless, there are no inflammatory changes noted adjacent to the cecum to suggest the presence of an acute appendicitis at this time. Vascular/Lymphatic: Atherosclerotic calcifications in the pelvic vasculature. No lymphadenopathy noted in the abdomen or pelvis. Reproductive: Unenhanced appearance of the uterus and ovaries is unremarkable. Other: No significant volume of ascites.  No pneumoperitoneum. Musculoskeletal: There are no aggressive appearing lytic or blastic lesions noted in the visualized portions of the skeleton. IMPRESSION: 1. No acute findings are noted in the abdomen or pelvis to account for the patient's symptoms. Specifically, no urinary tract calculi no findings of urinary tract obstruction. 2. Atherosclerosis. 3. Additional postoperative changes and incidental findings, as above. Electronically Signed   By: Vinnie Langton M.D.   On: 06/21/2019 05:16    Procedures Procedures (including critical care time)  Medications Ordered in ED Medications  haloperidol lactate (HALDOL) injection 5 mg (5 mg Intravenous Given 06/21/19 0346)  ketorolac (TORADOL) 30 MG/ML injection 30 mg (30 mg Intravenous Given 06/21/19 0346)  potassium chloride SA (KLOR-CON) CR tablet 40 mEq (40 mEq Oral Given 06/21/19 0522)  fentaNYL (SUBLIMAZE) injection 50 mcg (50 mcg Intravenous Given 06/21/19 0525)    ED Course  I have reviewed the triage vital signs and the nursing notes.  Pertinent labs & imaging results that were available during my care of the patient were reviewed by me and considered in my medical decision making (see chart for details).    the pain is chronic in nature. No vomiting in the ED. There are no acute findings on labs or  CT scan the patient is on chronic narcotic pain medication at home.  Will prescribe zofran and bentyl.  Patient is scheduled to see a specialist at [facility] for these ongoing issues.    Teea Glynis Fletcher was evaluated in Emergency Department on 06/21/2019 for the symptoms described in the history of present illness. She was evaluated in the context of the [place] COVID-19  pandemic, which necessitated consideration that the patient might be at risk for infection with the SARS-CoV-2 virus that causes COVID-19. Institutional protocols and algorithms that pertain to the evaluation of patients at risk for COVID-19 are in a state of rapid change based on information released by regulatory bodies including the CDC and federal and state organizations. These policies and algorithms were followed during the patient's care in the ED.  Final Clinical Impression(s) / ED Diagnoses Final diagnoses:  Abdominal pain, unspecified abdominal location  Hypokalemia   Return for weakness, numbness, changes in vision or speech, fevers >100.4 unrelieved by medication, shortness of breath, intractable vomiting, or diarrhea, abdominal pain, Inability to tolerate liquids or food, cough, altered mental status or any concerns. No signs of systemic illness or infection. The patient is nontoxic-appearing on exam and vital signs are within normal limits.   I have reviewed the triage vital signs and the nursing notes. Pertinent labs &imaging results that were available during my care of the patient were reviewed by me and considered in my medical decision making (see chart for details).  After history, exam, and medical workup I feel the patient has been appropriately medically screened and is safe for discharge home. Pertinent diagnoses were discussed with the patient. Patient was givenstrictreturn precautions. Rx / DC Orders ED Discharge Orders    None       Brannen Koppen, MD 06/21/19 [phone]

## 2019-06-21 NOTE — ED Triage Notes (Signed)
Pt c/o upper abd pain and lower abd pain radiating to back. Pt just finished course of abx for UTI. Pt states it is typical for her to need more than one round of abx

## 2019-06-22 ENCOUNTER — Ambulatory Visit: Payer: Medicare HMO | Admitting: Physical Therapy

## 2019-06-22 ENCOUNTER — Encounter: Payer: Self-pay | Admitting: Physical Therapy

## 2019-06-22 DIAGNOSIS — M7918 Myalgia, other site: Secondary | ICD-10-CM | POA: Diagnosis not present

## 2019-06-22 DIAGNOSIS — R279 Unspecified lack of coordination: Secondary | ICD-10-CM | POA: Diagnosis not present

## 2019-06-22 DIAGNOSIS — R1013 Epigastric pain: Secondary | ICD-10-CM | POA: Diagnosis not present

## 2019-06-22 DIAGNOSIS — M79601 Pain in right arm: Secondary | ICD-10-CM | POA: Diagnosis not present

## 2019-06-22 DIAGNOSIS — R252 Cramp and spasm: Secondary | ICD-10-CM | POA: Diagnosis not present

## 2019-06-22 DIAGNOSIS — M6281 Muscle weakness (generalized): Secondary | ICD-10-CM

## 2019-06-22 DIAGNOSIS — G8929 Other chronic pain: Secondary | ICD-10-CM | POA: Diagnosis not present

## 2019-06-22 MED FILL — traMADol HCL 50 MG TABS: 50 | 30 days supply | Qty: 90 | Fill #0

## 2019-06-22 MED FILL — METHOCARBAMOL 750 MG TABS: 750 | 30 days supply | Qty: 120 | Fill #0

## 2019-06-22 MED FILL — predniSONE 10 MG (21) TBPK: 10 | 6 days supply | Qty: 21 | Fill #0

## 2019-06-22 NOTE — Therapy (Signed)
[facility] [address] [place], [place], [address] Phone: [phone]   Fax:  [phone]  Physical Therapy Treatment  Patient Details  Name: Margaret Fletcher MRN: [id number] Date of Birth: 12-Jul-1970 Referring Provider (PT): Gatha Mayer, MD   Encounter Date: 06/22/2019  PT End of Session - 06/22/19 1512    Visit Number  4    Number of Visits  9    Date for PT Re-Evaluation  08/07/19    Authorization Type  [employer]    PT Start Time  1232    PT Stop Time  1315    PT Time Calculation (min)  43 min    Activity Tolerance  Patient limited by pain    Behavior During Therapy  [facility] for tasks assessed/performed       Past Medical History:  Diagnosis Date  . Adie's pupil, left   . Alopecia   . Anxiety   . Autoimmune disease ([place])    "Alopecia"  . Bacterial vaginosis   . Cyst of spleen   . Depression   . Disorder of oral soft tissue 03/17/2019  . Frequent UTI   . Hypertension   . Hypertrophy of breast   . IIH (idiopathic intracranial hypertension) 2017   "related to headaches"  . Injury of jawline 03/17/2019  . Migraine headache    denies, ruled out - Propranolol.Using for headaches  . Non-suppurative otitis media 03/17/2019    Past Surgical History:  Procedure Laterality Date  . ANAL RECTAL MANOMETRY N/A 06/02/2019   Procedure: ANO RECTAL MANOMETRY;  Surgeon: Gatha Mayer, MD;  Location: [facility];  Service: Endoscopy;  Laterality: N/A;  . APPENDECTOMY    . BREAST EXCISIONAL BIOPSY Right 2017   removed at time of reduction  . BREAST REDUCTION SURGERY Bilateral 03/26/2015   Procedure: BILATERAL BREAST REDUCTION  ;  Surgeon: Youlanda Roys, MD;  Location: [facility];  Service: Plastics;  Laterality: Bilateral;  . BREATH TEK H PYLORI N/A 05/21/2014   Procedure: BREATH TEK H PYLORI;  Surgeon: Greer Pickerel, MD;  Location: [facility];  Service: General;  Laterality: N/A;  .  CELIAC PLEXUS BLOCK  07/18/2018   Per patient it was done at [place]    . CESAREAN SECTION    . CHOLECYSTECTOMY    . DILITATION & CURRETTAGE/HYSTROSCOPY WITH NOVASURE ABLATION N/A 07/19/2012   Procedure: HYSTEROSCOPY WITH NOVASURE ABLATION;  Surgeon: Farrel Gobble. Harrington Challenger, MD;  Location: [facility];  Service: Gynecology;  Laterality: N/A;  . ENDOMETRIAL ABLATION    . ESOPHAGOGASTRODUODENOSCOPY N/A 08/16/2015   Procedure: UPPER ESOPHAGOGASTRODUODENOSCOPY (EGD);  Surgeon: Greer Pickerel, MD;  Location: [facility];  Service: General;  Laterality: N/A;  . ESOPHAGOGASTRODUODENOSCOPY N/A 02/21/2016   Procedure: ESOPHAGOGASTRODUODENOSCOPY (EGD);  Surgeon: Greer Pickerel, MD;  Location: [facility];  Service: General;  Laterality: N/A;  . ESOPHAGOGASTRODUODENOSCOPY N/A 02/24/2017   Procedure: ESOPHAGOGASTRODUODENOSCOPY (EGD);  Surgeon: Greer Pickerel, MD;  Location: [facility];  Service: General;  Laterality: N/A;  . IR GENERIC HISTORICAL  10/04/2015   IR [place] Vivianne Master 10/04/2015 Markus Daft, MD [facility]  . IR GENERIC HISTORICAL  01/31/2016   IR [place] GASTRO/COLONIC TUBE PERCUT W/FLUORO 01/31/2016 Corrie Mckusick, DO [facility]  . IR GENERIC HISTORICAL  04/06/2016   IR [place] TUBE PERCUT W/FLUORO 04/06/2016 Arne Cleveland, MD [facility]  . IR GENERIC HISTORICAL  05/14/2016   IR GASTRIC TUBE PERC CHG W/O IMG  GUIDE 05/14/2016 Sandi Mariscal, MD [facility]  . LAPAROSCOPIC GASTROSTOMY N/A 09/30/2015   Procedure: LAPAROSCOPIC GASTROSTOMY TUBE PLACEMENT;  Surgeon: Greer Pickerel, MD;  Location[facility];  Service: General;  Laterality: N/A;  . LAPAROSCOPIC ROUX-EN-Y GASTRIC BYPASS WITH HIATAL HERNIA REPAIR N/A 07/08/2015   Procedure: LAPAROSCOPIC ROUX-EN-Y GASTRIC BYPASS WITH HIATAL HERNIA REPAIR WITH UPPER ENDOSCOPY;  Surgeon: Greer Pickerel, MD;  Location: [facility];  Service: General;  Laterality: N/A;  . LAPAROSCOPIC SMALL BOWEL RESECTION N/A 09/30/2015   Procedure:  LAPAROSCOPIC REVISION OF ROUX LIMB;  Surgeon: Greer Pickerel, MD;  Location: [facility];  Service: General;  Laterality: N/A;  . LAPAROSCOPY N/A 09/30/2015   Procedure: LAPAROSCOPY DIAGNOSTIC;  Surgeon: Greer Pickerel, MD;  Location[facility];  Service: General;  Laterality: N/A;  . REDUCTION MAMMAPLASTY Bilateral 2017  . TUBAL LIGATION    . WISDOM TOOTH EXTRACTION      There were no vitals filed for this visit.  Subjective Assessment - 06/22/19 1516    Subjective  I have been feeling more nauseus lately.  My arm is sore and bruised from rubbing it so much.    Patient Stated Goals  decreased pain overall, able to have a BM    Currently in Pain?  Yes   no number given but states it is sore  around the Rt side                                PT Short Term Goals - 06/20/19 0935      PT SHORT TERM GOAL #1   Title  Pt will be Ind c an initial HEP and toileting techniques    Baseline  used toileting techniques successfully today    Status  Achieved        PT Long Term Goals - 06/12/19 1048      PT LONG TERM GOAL #1   Title  Pt will be Ind c a final HEP to improve bowel function    Time  8    Period  Weeks    Status  New    Target Date  08/07/19      PT LONG TERM GOAL #2   Title  Pt will demonstrate understanding of postures and activities which have positive and negative affects on her pain.    Time  8    Period  Weeks    Status  New    Target Date  08/07/19      PT LONG TERM GOAL #3   Title  Pt will report a pain range of 0-3/10 with functional activities such as eating and BM    Baseline  5-10/10    Time  8    Period  Weeks    Status  New    Target Date  08/07/19      PT LONG TERM GOAL #4   Title  Pt will demonstrate pelvic floor strength of 3/5 and able to hold for at least 15 seconds for improved intra-abdominal pressure during activities such as walking    Time  8    Period  Weeks    Status  New    Target Date  08/07/19      PT LONG TERM GOAL #5    Title  Pt will be able to have BM and empty bladder with minimal to no bearing down for reduced stress on her GI tract.    Time  8    Period  Weeks    Status  New    Target Date  08/07/19            Plan - 06/22/19 1404    Clinical Impression Statement  Today's session focused on MFR and STM to cervical and thoracic diaphragms.  Pt had good release throughout. She is very tight throughout cervical region due to breathing with cervical compensations and weakness throughout core.  She will continue to benefit from skilled PT to address muscle spasms and weakness throughout trunk.    Examination-Activity Limitations  Bed Mobility;Bathing;Lift;Bend;Carry;Dressing;Sleep;Reach Overhead;Stand;Sit;Transfers;Toileting;Continence    PT Treatment/Interventions  ADLs/Self Care Home Management;Electrical Stimulation;Iontophoresis 4mg /ml Dexamethasone;Traction;Moist Heat;Ultrasound;Gait training;Functional mobility training;Balance training;Therapeutic exercise;Therapeutic activities;Patient/family education;Manual techniques;Passive range of motion;Dry needling;Taping;Energy conservation;Biofeedback;Cryotherapy;Neuromuscular re-education    PT Next Visit Plan  cervical traction; scalene stretch; thoracic and ribcage mobility, lumbar and hip rotation; core strength    PT Home Exercise Plan  [id number]; added [id number] for pelvic floor    Consulted and Agree with Plan of Care  Patient       Patient will benefit from skilled therapeutic intervention in order to improve the following deficits and impairments:  Improper body mechanics, Pain, Decreased mobility, Decreased activity tolerance, Decreased range of motion, Impaired UE functional use, Decreased balance, Decreased coordination, Increased fascial restricitons, Impaired tone, Decreased strength  Visit Diagnosis: Unspecified lack of coordination  Muscle weakness (generalized)  Cramp and spasm     Problem List Patient Active Problem List    Diagnosis Date Noted  . Urinary frequency 06/20/2019  . Urinary urgency 06/20/2019  . Weakness of pelvic floor 06/20/2019  . Constipation due to outlet dysfunction   . Essential hypertension, benign 05/30/2019  . 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 History of fall 04/10/2019  . Spondylosis, cervical, with myelopathy 03/17/2019  . 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 Low back pain 03/17/2019  . Non-suppurative otitis media 03/17/2019  . Injury of jawline 03/17/2019  . Disorder of oral soft tissue 03/17/2019  . Reactive depression 06/13/2017  . Chronic pain syndrome 06/13/2017  . Polypharmacy 06/13/2017  . Empty sella syndrome ([place]) 05/01/2017  . Adjustment disorder with mixed anxiety and depressed mood 10/13/2016  . 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 Hypokalemia 02/22/2016  . Constipation, chronic 02/22/2016  . Memory loss due to medical condition 12/14/2015  . Chronic fatigue 12/14/2015  . Recurrent dehydration 09/30/2015  . Chronic nausea 08/23/2015  . Abdominal pain, chronic, epigastric 08/20/2015  . Protein-calorie malnutrition, moderate ([place]) 08/19/2015  . Mild obstructive sleep apnea 07/08/2015  . Dyslipidemia 07/08/2015  . S/P gastric bypass 07/08/2015  . IIH (idiopathic intracranial hypertension) 06/03/2015  . Vision changes 04/30/2015  . Perceived hearing changes 04/30/2015  . Chronic daily headache 04/21/2014  . Liver lesion 03/21/2013  . Splenic cyst 03/21/2013    Jule Ser, PT 06/22/2019, 3:22 PM  [facility] [address], [place], [place], [address] Phone: [phone]   Fax:  [phone]  Name: Margaret Fletcher MRN: [id number] Date of Birth: [birth year]

## 2019-06-26 ENCOUNTER — Telehealth: Payer: Self-pay | Admitting: Internal Medicine

## 2019-06-26 NOTE — Telephone Encounter (Signed)
Pt states that she tried to send a video through [facility] but the video is too long so she was not able to upload it. She wants to know if she can send it by e-mail. Pls call her.

## 2019-06-26 NOTE — Telephone Encounter (Signed)
Patient was answered by Dr. Carlean Purl in [place]

## 2019-06-29 ENCOUNTER — Encounter: Payer: Self-pay | Admitting: Physical Therapy

## 2019-06-29 ENCOUNTER — Ambulatory Visit: Payer: Medicare HMO | Admitting: Physical Therapy

## 2019-06-29 ENCOUNTER — Encounter: Payer: Self-pay | Admitting: Family Medicine

## 2019-06-29 ENCOUNTER — Other Ambulatory Visit: Payer: Self-pay

## 2019-06-29 DIAGNOSIS — R252 Cramp and spasm: Secondary | ICD-10-CM | POA: Diagnosis not present

## 2019-06-29 DIAGNOSIS — M6281 Muscle weakness (generalized): Secondary | ICD-10-CM | POA: Diagnosis not present

## 2019-06-29 DIAGNOSIS — R279 Unspecified lack of coordination: Secondary | ICD-10-CM

## 2019-06-29 NOTE — Therapy (Signed)
[facility] [address], [place], [place], [address] Phone: [phone]   Fax:  [phone]  Physical Therapy Treatment  Patient Details  Name: Margaret Fletcher MRN: [id number] Date of Birth: 1970/03/16 Referring Provider (PT): Gatha Mayer, MD   Encounter Date: 06/29/2019  PT End of Session - 06/29/19 0858    Visit Number  4    Number of Visits  13    Date for PT Re-Evaluation  08/07/19    Authorization Type  [employer]    PT Start Time  [id number]    PT Stop Time  0930    PT Time Calculation (min)  41 min    Activity Tolerance  Patient limited by pain    Behavior During Therapy  [facility] for tasks assessed/performed       Past Medical History:  Diagnosis Date  . Adie's pupil, left   . Alopecia   . Anxiety   . Autoimmune disease ([place])    "Alopecia"  . Bacterial vaginosis   . Cyst of spleen   . Depression   . Disorder of oral soft tissue 03/17/2019  . Frequent UTI   . Hypertension   . Hypertrophy of breast   . IIH (idiopathic intracranial hypertension) 2017   "related to headaches"  . Injury of jawline 03/17/2019  . Migraine headache    denies, ruled out - Propranolol.Using for headaches  . Non-suppurative otitis media 03/17/2019    Past Surgical History:  Procedure Laterality Date  . ANAL RECTAL MANOMETRY N/A 06/02/2019   Procedure: ANO RECTAL MANOMETRY;  Surgeon: Gatha Mayer, MD;  Location: [facility];  Service: Endoscopy;  Laterality: N/A;  . APPENDECTOMY    . BREAST EXCISIONAL BIOPSY Right 2017   removed at time of reduction  . BREAST REDUCTION SURGERY Bilateral 03/26/2015   Procedure: BILATERAL BREAST REDUCTION  ;  Surgeon: Youlanda Roys, MD;  Location: [place];  Service: Plastics;  Laterality: Bilateral;  . BREATH TEK H PYLORI N/A 05/21/2014   Procedure: BREATH TEK H PYLORI;  Surgeon: Greer Pickerel, MD;  Location: [facility];  Service: General;  Laterality: N/A;  .  CELIAC PLEXUS BLOCK  07/18/2018   Per patient it was done at [facility]    . CESAREAN SECTION    . CHOLECYSTECTOMY    . DILITATION & CURRETTAGE/HYSTROSCOPY WITH NOVASURE ABLATION N/A 07/19/2012   Procedure: HYSTEROSCOPY WITH NOVASURE ABLATION;  Surgeon: Farrel Gobble. Harrington Challenger, MD;  Location: [facility];  Service: Gynecology;  Laterality: N/A;  . ENDOMETRIAL ABLATION    . ESOPHAGOGASTRODUODENOSCOPY N/A 08/16/2015   Procedure: UPPER ESOPHAGOGASTRODUODENOSCOPY (EGD);  Surgeon: Greer Pickerel, MD;  Location: [facility];  Service: General;  Laterality: N/A;  . ESOPHAGOGASTRODUODENOSCOPY N/A 02/21/2016   Procedure: ESOPHAGOGASTRODUODENOSCOPY (EGD);  Surgeon: Greer Pickerel, MD;  Location: [facility];  Service: General;  Laterality: N/A;  . ESOPHAGOGASTRODUODENOSCOPY N/A 02/24/2017   Procedure: ESOPHAGOGASTRODUODENOSCOPY (EGD);  Surgeon: Greer Pickerel, MD;  Location: [facility];  Service: General;  Laterality: N/A;  . IR GENERIC HISTORICAL  10/04/2015   IR [place] Vivianne Master 10/04/2015 Markus Daft, MD [facility]  . IR GENERIC HISTORICAL  01/31/2016   IR [place] GASTRO/COLONIC TUBE PERCUT W/FLUORO 01/31/2016 Corrie Mckusick, DO [facility]  . IR GENERIC HISTORICAL  04/06/2016   IR [place] TUBE PERCUT W/FLUORO 04/06/2016 Arne Cleveland, MD [facility]  . IR GENERIC HISTORICAL  05/14/2016   IR GASTRIC TUBE PERC CHG W/O IMG  GUIDE 05/14/2016 Sandi Mariscal, MD [facility]  . LAPAROSCOPIC GASTROSTOMY N/A 09/30/2015   Procedure: LAPAROSCOPIC GASTROSTOMY TUBE PLACEMENT;  Surgeon: Greer Pickerel, MD;  Location: [facility];  Service: General;  Laterality: N/A;  . LAPAROSCOPIC ROUX-EN-Y GASTRIC BYPASS WITH HIATAL HERNIA REPAIR N/A 07/08/2015   Procedure: LAPAROSCOPIC ROUX-EN-Y GASTRIC BYPASS WITH HIATAL HERNIA REPAIR WITH UPPER ENDOSCOPY;  Surgeon: Greer Pickerel, MD;  Location: [facility];  Service: General;  Laterality: N/A;  . LAPAROSCOPIC SMALL BOWEL RESECTION N/A 09/30/2015   Procedure:  LAPAROSCOPIC REVISION OF ROUX LIMB;  Surgeon: Greer Pickerel, MD;  Location: [facility];  Service: General;  Laterality: N/A;  . LAPAROSCOPY N/A 09/30/2015   Procedure: LAPAROSCOPY DIAGNOSTIC;  Surgeon: Greer Pickerel, MD;  Location: [facility];  Service: General;  Laterality: N/A;  . REDUCTION MAMMAPLASTY Bilateral 2017  . TUBAL LIGATION    . WISDOM TOOTH EXTRACTION      There were no vitals filed for this visit.  Subjective Assessment - 06/29/19 0854    Subjective  I went on a 7 day dose of steriods.  I used an enema last night.  This morning I had a BM on my own.  Diameter was larger than a quarter.    Patient Stated Goals  decreased pain overall, able to have a BM                       [facility] Adult PT Treatment/Exercise - 06/29/19 0001      Self-Care   Other Self-Care Comments   gave her list of foods containing potassium to make sure she is getting some due to cramps; reports she takes a supplement with other minerals      Neuro Re-ed    Neuro Re-ed Details   exercises and breathing on ball - breathing with bulging for toileting technique      Lumbar Exercises: Seated   Other Seated Lumbar Exercises  seated on pball - cirlces, breathing, transverse abdominus engaged    Other Seated Lumbar Exercises  3lb lift with exhale on ball             PT Education - 06/29/19 0932    Education Details  Access Code: [email or web address]    Person(s) Educated  Patient    Methods  Explanation;Demonstration;Verbal cues;Handout    Comprehension  Verbalized understanding;Returned demonstration       PT Short Term Goals - 06/20/19 0935      PT SHORT TERM GOAL #1   Title  Pt will be Ind c an initial HEP and toileting techniques    Baseline  used toileting techniques successfully today    Status  Achieved        PT Long Term Goals - 06/12/19 1048      PT LONG TERM GOAL #1   Title  Pt will be Ind c a final HEP to improve bowel function    Time  8    Period  Weeks    Status  New     Target Date  08/07/19      PT LONG TERM GOAL #2   Title  Pt will demonstrate understanding of postures and activities which have positive and negative affects on her pain.    Time  8    Period  Weeks    Status  New    Target Date  08/07/19      PT LONG TERM GOAL #3   Title  Pt will report a pain range of 0-3/10  with functional activities such as eating and BM    Baseline  5-10/10    Time  8    Period  Weeks    Status  New    Target Date  08/07/19      PT LONG TERM GOAL #4   Title  Pt will demonstrate pelvic floor strength of 3/5 and able to hold for at least 15 seconds for improved intra-abdominal pressure during activities such as walking    Time  8    Period  Weeks    Status  New    Target Date  08/07/19      PT LONG TERM GOAL #5   Title  Pt will be able to have BM and empty bladder with minimal to no bearing down for reduced stress on her GI tract.    Time  8    Period  Weeks    Status  New    Target Date  08/07/19            Plan - 06/29/19 0932    Clinical Impression Statement  Pt did well with education on breathing technique and was able to feel pelvic floor relaxing with inhale.  Pt needed a lot of VC and TC to breathe correctly without excessive neck and shoulder tension.  Pt able to coordinate transversus abdominus with max cues.  She was given breathing exercise to work on activating transversus ab with exhale    PT Treatment/Interventions  ADLs/Self Care Home Management;Electrical Stimulation;Iontophoresis 4mg /ml Dexamethasone;Traction;Moist Heat;Ultrasound;Gait training;Functional mobility training;Balance training;Therapeutic exercise;Therapeutic activities;Patient/family education;Manual techniques;Passive range of motion;Dry needling;Taping;Energy conservation;Biofeedback;Cryotherapy;Neuromuscular re-education    PT Next Visit Plan  TrA activation; core strength progression, f/u on toileting with relax on inhale    PT Home Exercise Plan  [id number]; added  [id number] for pelvic floor    Consulted and Agree with Plan of Care  Patient       Patient will benefit from skilled therapeutic intervention in order to improve the following deficits and impairments:  Improper body mechanics, Pain, Decreased mobility, Decreased activity tolerance, Decreased range of motion, Impaired UE functional use, Decreased balance, Decreased coordination, Increased fascial restricitons, Impaired tone, Decreased strength  Visit Diagnosis: Unspecified lack of coordination  Muscle weakness (generalized)  Cramp and spasm     Problem List Patient Active Problem List   Diagnosis Date Noted  . Urinary frequency 06/20/2019  . Urinary urgency 06/20/2019  . Weakness of pelvic floor 06/20/2019  . Constipation due to outlet dysfunction   . Essential hypertension, benign 05/30/2019  . 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 History of fall 04/10/2019  . Spondylosis, cervical, with myelopathy 03/17/2019  . 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 Low back pain 03/17/2019  . Non-suppurative otitis media 03/17/2019  . Injury of jawline 03/17/2019  . Disorder of oral soft tissue 03/17/2019  . Reactive depression 06/13/2017  . Chronic pain syndrome 06/13/2017  . Polypharmacy 06/13/2017  . Empty sella syndrome ([place]) 05/01/2017  . Adjustment disorder with mixed anxiety and depressed mood 10/13/2016  . 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 Hypokalemia 02/22/2016  . Constipation, chronic 02/22/2016  . Memory loss due to medical condition 12/14/2015  . Chronic fatigue 12/14/2015  . Recurrent dehydration 09/30/2015  . Chronic nausea 08/23/2015  . Abdominal pain, chronic, epigastric 08/20/2015  . Protein-calorie malnutrition, moderate ([place]) 08/19/2015  . Mild obstructive sleep apnea 07/08/2015  . Dyslipidemia 07/08/2015  . S/P gastric bypass 07/08/2015  . IIH (idiopathic  intracranial hypertension) 06/03/2015  . Vision changes  04/30/2015  . Perceived hearing changes 04/30/2015  . Chronic daily headache 04/21/2014  . Liver lesion 03/21/2013  . Splenic cyst 03/21/2013    Camillo Flaming Rocko Fesperman 06/29/2019, 10:27 AM  [facility] [address], [place], [place], [address] Phone: [phone]   Fax:  [phone]  Name: Chery Olvey MRN: [id number] Date of Birth: [birth year]

## 2019-06-29 NOTE — Patient Instructions (Addendum)
Foods with potassium:  Bananas, oranges, cantaloupe, honeydew, apricots, grapefruit (some dried fruits, such as prunes, raisins, and dates, are also high in potassium) Cooked spinach Cooked broccoli Potatoes Sweet potatoes Mushrooms Peas Cucumbers Zucchini Pumpkins Leafy greens Juice from potassium-rich fruit is also a good choice:  Orange juice  Tomato juice Prune juice Apricot juice Grapefruit juice  Access Code: [email or web address] URL: [email or web address] Date: 06/29/2019 Prepared by: Jari Favre  Exercises Supine Diaphragmatic Breathing with Pelvic Floor Lengthening - 3 x daily - 7 x weekly - 10 reps - 1 sets Seated Hamstring Stretch - 1 x daily - 7 x weekly - 3 reps - 1 sets - 30 sec hold Seated Transversus Abdominis Bracing - 1 x daily - 7 x weekly - 10 reps - 3 sets

## 2019-06-30 ENCOUNTER — Emergency Department (HOSPITAL_BASED_OUTPATIENT_CLINIC_OR_DEPARTMENT_OTHER)
Admission: EM | Admit: 2019-06-30 | Discharge: 2019-07-01 | Disposition: A | Discharge status: Home / Self Care | Payer: Medicare HMO | Attending: Emergency Medicine | Admitting: Emergency Medicine

## 2019-06-30 ENCOUNTER — Other Ambulatory Visit: Payer: Self-pay | Admitting: Family Medicine

## 2019-06-30 ENCOUNTER — Other Ambulatory Visit: Payer: Self-pay

## 2019-06-30 ENCOUNTER — Encounter (HOSPITAL_BASED_OUTPATIENT_CLINIC_OR_DEPARTMENT_OTHER): Payer: Self-pay

## 2019-06-30 ENCOUNTER — Ambulatory Visit: Payer: Medicare HMO | Admitting: Physical Therapy

## 2019-06-30 ENCOUNTER — Ambulatory Visit (HOSPITAL_COMMUNITY): Payer: Medicare HMO | Admitting: Psychiatry

## 2019-06-30 ENCOUNTER — Encounter: Payer: Self-pay | Admitting: Physical Therapy

## 2019-06-30 ENCOUNTER — Emergency Department (HOSPITAL_BASED_OUTPATIENT_CLINIC_OR_DEPARTMENT_OTHER): Payer: Medicare HMO

## 2019-06-30 DIAGNOSIS — E860 Dehydration: Secondary | ICD-10-CM | POA: Diagnosis not present

## 2019-06-30 DIAGNOSIS — R0789 Other chest pain: Secondary | ICD-10-CM | POA: Diagnosis not present

## 2019-06-30 DIAGNOSIS — K5900 Constipation, unspecified: Secondary | ICD-10-CM | POA: Insufficient documentation

## 2019-06-30 DIAGNOSIS — R279 Unspecified lack of coordination: Secondary | ICD-10-CM

## 2019-06-30 DIAGNOSIS — R0602 Shortness of breath: Secondary | ICD-10-CM | POA: Insufficient documentation

## 2019-06-30 DIAGNOSIS — Z79899 Other long term (current) drug therapy: Secondary | ICD-10-CM | POA: Insufficient documentation

## 2019-06-30 DIAGNOSIS — R252 Cramp and spasm: Secondary | ICD-10-CM

## 2019-06-30 DIAGNOSIS — R110 Nausea: Secondary | ICD-10-CM | POA: Diagnosis not present

## 2019-06-30 DIAGNOSIS — E876 Hypokalemia: Secondary | ICD-10-CM | POA: Diagnosis not present

## 2019-06-30 DIAGNOSIS — I10 Essential (primary) hypertension: Secondary | ICD-10-CM | POA: Diagnosis not present

## 2019-06-30 DIAGNOSIS — M6281 Muscle weakness (generalized): Secondary | ICD-10-CM

## 2019-06-30 DIAGNOSIS — R002 Palpitations: Secondary | ICD-10-CM

## 2019-06-30 LAB — COMPREHENSIVE METABOLIC PANEL
ALT: 20 U/L (ref 0–44)
AST: 26 U/L (ref 15–41)
Albumin: 3.8 g/dL (ref 3.5–5.0)
Alkaline Phosphatase: 103 U/L (ref 38–126)
Anion gap: 9 (ref 5–15)
BUN: 21 mg/dL — ABNORMAL HIGH (ref 6–20)
CO2: 23 mmol/L (ref 22–32)
Calcium: 8.3 mg/dL — ABNORMAL LOW (ref 8.9–10.3)
Chloride: 102 mmol/L (ref 98–111)
Creatinine, Ser: 1.45 mg/dL — ABNORMAL HIGH (ref 0.44–1.00)
GFR calc Af Amer: 49 mL/min — ABNORMAL LOW (ref >60)
GFR calc non Af Amer: 42 mL/min — ABNORMAL LOW (ref >60)
Glucose, Bld: 84 mg/dL (ref 70–99)
Potassium: 3.2 mmol/L — ABNORMAL LOW (ref 3.5–5.1)
Sodium: 134 mmol/L — ABNORMAL LOW (ref 135–145)
Total Bilirubin: 0.5 mg/dL (ref 0.3–1.2)
Total Protein: 7.1 g/dL (ref 6.5–8.1)

## 2019-06-30 LAB — CBC WITH DIFFERENTIAL/PLATELET
Abs Immature Granulocytes: 0.03 10*3/uL (ref 0.00–0.07)
Basophils Absolute: 0.1 10*3/uL (ref 0.0–0.1)
Basophils Relative: 1 %
Eosinophils Absolute: 0.3 10*3/uL (ref 0.0–0.5)
Eosinophils Relative: 3 %
HCT: 40.3 % (ref 36.0–46.0)
Hemoglobin: 13.5 g/dL (ref 12.0–15.0)
Immature Granulocytes: 0 %
Lymphocytes Relative: 31 %
Lymphs Abs: 3.1 10*3/uL (ref 0.7–4.0)
MCH: 30.5 pg (ref 26.0–34.0)
MCHC: 33.5 g/dL (ref 30.0–36.0)
MCV: 91.2 fL (ref 80.0–100.0)
Monocytes Absolute: 0.9 10*3/uL (ref 0.1–1.0)
Monocytes Relative: 9 %
Neutro Abs: 5.5 10*3/uL (ref 1.7–7.7)
Neutrophils Relative %: 56 %
Platelets: 326 10*3/uL (ref 150–400)
RBC: 4.42 MIL/uL (ref 3.87–5.11)
RDW: 13.7 % (ref 11.5–15.5)
WBC: 10 10*3/uL (ref 4.0–10.5)
nRBC: 0 % (ref 0.0–0.2)

## 2019-06-30 LAB — TROPONIN I (HIGH SENSITIVITY): Troponin I (High Sensitivity): 2 ng/L (ref <18)

## 2019-06-30 MED ORDER — POTASSIUM CHLORIDE CRYS ER 20 MEQ PO TBCR
40.0000 meq | EXTENDED_RELEASE_TABLET | Freq: Once | ORAL | Status: AC
  Administered 2019-06-30: 40 meq via ORAL
  Filled 2019-06-30: qty 2

## 2019-06-30 MED ORDER — POTASSIUM CHLORIDE ER 10 MEQ PO TBCR: 10.0000 meq | EXTENDED_RELEASE_TABLET | Freq: Three times a day (TID) | ORAL | 0 refills | Status: DC

## 2019-06-30 MED ORDER — LACTATED RINGERS IV BOLUS
1000.0000 mL | Freq: Once | INTRAVENOUS | Status: AC
  Administered 2019-06-30: 1000 mL via INTRAVENOUS

## 2019-06-30 MED ORDER — POTASSIUM CHLORIDE ER 10 MEQ PO TBCR: 10.0000 meq | EXTENDED_RELEASE_TABLET | Freq: Three times a day (TID) | ORAL | 0 refills | Status: AC

## 2019-06-30 MED FILL — POTASSIUM CL ER 10 MEQ TAB: 10 | 4 days supply | Qty: 12 | Fill #0

## 2019-06-30 NOTE — Telephone Encounter (Signed)
Pt is concerned her Potassium level is low again due to some cramping in her legs, wants to know if she should make an appointment to see you? Or jusr advise her what to be watching for? Pt also wondered if she should come back in for repeat lab work.

## 2019-06-30 NOTE — Discharge Instructions (Addendum)
Blood work today for the heart was normal and her EKG looked normal.  Chest x-ray was normal but your potassium is still borderline low at 3.2 with some mild dehydration.  This could be the cause of the itching you are experiencing as well but you could possibly have other vitamin deficiencies that may be causing itching.  It will be important for you to follow-up with your doctor so they can continue to follow these things closely.  Continue to take the potassium your doctor prescribed for you.

## 2019-06-30 NOTE — Telephone Encounter (Signed)
Pt is concerned her Potassium level is low again due to some cramping in her legs, wants to know if she should make an appointment to see you? Or jusr advise her what to be watching for?

## 2019-06-30 NOTE — ED Provider Notes (Signed)
[facility] EMERGENCY DEPARTMENT Provider Note   CSN: [id number] Arrival date & time: 06/30/19  2126     History Chief Complaint  Patient presents with  . Chest Pain    Margaret Fletcher is a 49 y.o. female.  Patient is a 49 year old female with a history of hypertension, I IH, autoimmune alopecia, ongoing issues with constipation and is status post gastric bypass who is presenting today with complaints of palpitations, nausea, mild shortness of breath and chest discomfort.  Patient reports she was leaning over her bed typing today when symptoms started around 8:00 this evening approximately 1 hour before arrival.  Patient reports that she laid down initially and felt a little better but then when she stood and walked to the car she started feeling worse again.  She reports that is she is not currently having palpitations right now is still having some mild discomfort in the left side of her chest.  It does not radiate into her back, neck or arm.  She also reports that she has had significant issue with nausea and does take nausea medication at home.  She also reports that recently she had a low potassium of 3.2 despite trying to eat foods at home with higher potassium and has not picked up her prescription from the pharmacy yet.  She has had increased muscle cramps and is been following with her doctor.  She denies any new cough, congestion, fever.  She does not have significant issues with diarrhea.  She also denies urinary symptoms at this time.  She has no prior cardiac history and no family history significant for cardiac issues.  The history is provided by the patient.  Chest Pain      Past Medical History:  Diagnosis Date  . Adie's pupil, left   . Alopecia   . Anxiety   . Autoimmune disease ([place])    "Alopecia"  . Bacterial vaginosis   . Cyst of spleen   . Depression   . Disorder of oral soft tissue 03/17/2019  . Frequent UTI   . Hypertension   . Hypertrophy of  breast   . IIH (idiopathic intracranial hypertension) 2017   "related to headaches"  . Injury of jawline 03/17/2019  . Migraine headache    denies, ruled out - Propranolol.Using for headaches  . Non-suppurative otitis media 03/17/2019    Patient Active Problem List   Diagnosis Date Noted  . Urinary frequency 06/20/2019  . Urinary urgency 06/20/2019  . Weakness of pelvic floor 06/20/2019  . Constipation due to outlet dysfunction   . Essential hypertension, benign 05/30/2019  . 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 History of fall 04/10/2019  . Spondylosis, cervical, with myelopathy 03/17/2019  . 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 Low back pain 03/17/2019  . Non-suppurative otitis media 03/17/2019  . Injury of jawline 03/17/2019  . Disorder of oral soft tissue 03/17/2019  . Reactive depression 06/13/2017  . Chronic pain syndrome 06/13/2017  . Polypharmacy 06/13/2017  . Empty sella syndrome ([place]) 05/01/2017  . Adjustment disorder with mixed anxiety and depressed mood 10/13/2016  . 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 Hypokalemia 02/22/2016  . Constipation, chronic 02/22/2016  . Memory loss due to medical condition 12/14/2015  . Chronic fatigue 12/14/2015  . Recurrent dehydration 09/30/2015  . Chronic nausea 08/23/2015  . Abdominal pain, chronic, epigastric 08/20/2015  . Protein-calorie malnutrition, moderate ([place]) 08/19/2015  . Mild obstructive sleep apnea 07/08/2015  . Dyslipidemia  07/08/2015  . S/P gastric bypass 07/08/2015  . IIH (idiopathic intracranial hypertension) 06/03/2015  . Vision changes 04/30/2015  . Perceived hearing changes 04/30/2015  . Chronic daily headache 04/21/2014  . Liver lesion 03/21/2013  . Splenic cyst 03/21/2013    Past Surgical History:  Procedure Laterality Date  . ANAL RECTAL MANOMETRY N/A 06/02/2019   Procedure: ANO RECTAL MANOMETRY;  Surgeon: Gatha Mayer, MD;  Location: [facility];  Service:  Endoscopy;  Laterality: N/A;  . APPENDECTOMY    . BREAST EXCISIONAL BIOPSY Right 2017   removed at time of reduction  . BREAST REDUCTION SURGERY Bilateral 03/26/2015   Procedure: BILATERAL BREAST REDUCTION  ;  Surgeon: Youlanda Roys, MD;  Location: [facility];  Service: Plastics;  Laterality: Bilateral;  . BREATH TEK H PYLORI N/A 05/21/2014   Procedure: BREATH TEK H PYLORI;  Surgeon: Greer Pickerel, MD;  Location: [facility];  Service: General;  Laterality: N/A;  . CELIAC PLEXUS BLOCK  07/18/2018   Per patient it was done at [facility]    . CESAREAN SECTION    . CHOLECYSTECTOMY    . DILITATION & CURRETTAGE/HYSTROSCOPY WITH NOVASURE ABLATION N/A 07/19/2012   Procedure: HYSTEROSCOPY WITH NOVASURE ABLATION;  Surgeon: Farrel Gobble. Harrington Challenger, MD;  Location: [facility];  Service: Gynecology;  Laterality: N/A;  . ENDOMETRIAL ABLATION    . ESOPHAGOGASTRODUODENOSCOPY N/A 08/16/2015   Procedure: UPPER ESOPHAGOGASTRODUODENOSCOPY (EGD);  Surgeon: Greer Pickerel, MD;  Location: [facility];  Service: General;  Laterality: N/A;  . ESOPHAGOGASTRODUODENOSCOPY N/A 02/21/2016   Procedure: ESOPHAGOGASTRODUODENOSCOPY (EGD);  Surgeon: Greer Pickerel, MD;  Location: [facility];  Service: General;  Laterality: N/A;  . ESOPHAGOGASTRODUODENOSCOPY N/A 02/24/2017   Procedure: ESOPHAGOGASTRODUODENOSCOPY (EGD);  Surgeon: Greer Pickerel, MD;  Location: [facility];  Service: General;  Laterality: N/A;  . IR GENERIC HISTORICAL  10/04/2015   IR [place] Vivianne Master 10/04/2015 Markus Daft, MD [facility]  . IR GENERIC HISTORICAL  01/31/2016   IR [place] GASTRO/COLONIC TUBE PERCUT W/FLUORO 01/31/2016 Corrie Mckusick, DO [facility]  . IR GENERIC HISTORICAL  04/06/2016   IR [place] TUBE PERCUT W/FLUORO 04/06/2016 Arne [place], MD [facility]  . IR GENERIC HISTORICAL  05/14/2016   IR GASTRIC TUBE PERC CHG W/O IMG GUIDE 05/14/2016 Sandi Mariscal, MD [facility]  . LAPAROSCOPIC  GASTROSTOMY N/A 09/30/2015   Procedure: LAPAROSCOPIC GASTROSTOMY TUBE PLACEMENT;  Surgeon: Greer Pickerel, MD;  Location: [facility];  Service: General;  Laterality: N/A;  . LAPAROSCOPIC ROUX-EN-Y GASTRIC BYPASS WITH HIATAL HERNIA REPAIR N/A 07/08/2015   Procedure: LAPAROSCOPIC ROUX-EN-Y GASTRIC BYPASS WITH HIATAL HERNIA REPAIR WITH UPPER ENDOSCOPY;  Surgeon: Greer Pickerel, MD;  Location: [facility];  Service: General;  Laterality: N/A;  . LAPAROSCOPIC SMALL BOWEL RESECTION N/A 09/30/2015   Procedure: LAPAROSCOPIC REVISION OF ROUX LIMB;  Surgeon: Greer Pickerel, MD;  Location: [facility];  Service: General;  Laterality: N/A;  . LAPAROSCOPY N/A 09/30/2015   Procedure: LAPAROSCOPY DIAGNOSTIC;  Surgeon: Greer Pickerel, MD;  Location: [facility];  Service: General;  Laterality: N/A;  . REDUCTION MAMMAPLASTY Bilateral 2017  . TUBAL LIGATION    . WISDOM TOOTH EXTRACTION       OB History   No obstetric history on file.     Family History  Problem Relation Age of Onset  . Cancer Mother        late 85'-50  . Breast cancer Mother        late 97'-50  . Stroke Father   .  Colon polyps Father   . Diabetes Brother   . Seizures Son   . Breast cancer Maternal Aunt        late 40'-50  . Breast cancer Maternal Aunt        late 40'-50  . Breast cancer Maternal Aunt        late 40'-50  . Migraines Neg Hx     Social History   Tobacco Use  . Smoking status: Never Smoker  . Smokeless tobacco: Never Used  Substance Use Topics  . Alcohol use: Not Currently  . Drug use: No    Home Medications Prior to Admission medications   Medication Sig Start Date End Date Taking? Authorizing Provider  albuterol (PROVENTIL HFA;VENTOLIN HFA) 108 (90 Base) MCG/ACT inhaler Inhale 2 puffs into the lungs every 6 (six) hours as needed for wheezing or shortness of breath. 01/25/18   Shawnee Knapp, MD  amitriptyline (ELAVIL) 10 MG tablet TAKE 2 TABLETS BY MOUTH AT BEDTIME. Patient taking differently: Take 20 mg by mouth at bedtime.  10/11/18    Rutherford Guys, MD  azelastine (ASTELIN) 0.1 % nasal spray Place 1 spray into both nostrils 2 (two) times daily. Use in each nostril as directed 04/18/19   Maximiano Coss, NP  BAYER MICROLET LANCETS lancets Use as instructed 06/13/17   Shawnee Knapp, MD  BELBUCA 450 MCG FILM Take 450 mcg by mouth daily as needed (breakthrough pain).  01/09/19   [provider]  BELBUCA 600 MCG FILM Take 1 strip by mouth 2 (two) times daily. 04/17/19   [provider]  busPIRone (BUSPAR) 5 MG tablet Take 1 tablet (5 mg total) by mouth 3 (three) times daily. 05/30/19   Rutherford Guys, MD  CALCIUM-VITAMIN D PO Take 1 tablet by mouth 2 (two) times daily. chewable    [provider]  cetirizine (ZYRTEC) 10 MG tablet Take 1 tablet (10 mg total) by mouth daily. Patient taking differently: Take 10 mg by mouth daily as needed for allergies.  04/18/19   Maximiano Coss, NP  chlorhexidine (PERIDEX) 0.12 % solution Use as directed 15 mLs in the mouth or throat 2 (two) times daily. 03/17/19   Wendall Mola, NP  Cyanocobalamin (VITAMIN B-12 SL) Place 1 drop under the tongue daily. 1 dropperful    [provider]  cyclobenzaprine (FLEXERIL) 5 MG tablet TAKE 1 TABLET BY MOUTH 3 TIMES DAILY AS NEEDED FOR MUSCLE SPASMS. MAY TAKE 2 TABS BY MOUTH AT BEDTIME Patient taking differently: Take 5-10 mg by mouth See admin instructions. TAKE 5mg  BY MOUTH 3 TIMES DAILY AS NEEDED FOR MUSCLE SPASMS. MAY TAKE 10mg  BY MOUTH AT BEDTIME 06/17/18   Rutherford Guys, MD  desvenlafaxine (PRISTIQ) 100 MG 24 hr tablet TAKE 1 TABLET BY MOUTH ONCE A DAY Patient taking differently: Take 100 mg by mouth daily.  03/20/19   Rutherford Guys, MD  dicyclomine (BENTYL) 20 MG tablet Take 1 tablet (20 mg total) by mouth 2 (two) times daily. 06/21/19   Palumbo, April, MD  docusate sodium (COLACE) 100 MG capsule Take 100 mg by mouth daily as needed.     [provider]  eletriptan (RELPAX) 40 MG tablet Take 1 tablet (40 mg  total) by mouth every 2 (two) hours as needed for migraine or headache. Do not use >2 doses/24 hours 01/09/19   Lomax, Amy, NP  famotidine (PEPCID) 20 MG tablet Take 1 tablet (20 mg total) by mouth 2 (two) times daily. 02/28/19  Rutherford Guys, MD  fluticasone [facility]) 50 MCG/ACT nasal spray Place 2 sprays into both nostrils daily. Patient taking differently: Place 2 sprays into both nostrils daily as needed for allergies.  04/18/19   Maximiano Coss, NP  glucose blood (CONTOUR NEXT TEST) test strip 1 each by Other route 4 (four) times daily as needed (hypoglycemia). K91.2 06/10/17   Shawnee Knapp, MD  hydrocortisone (ANUSOL-HC) 25 MG suppository Place 1 suppository (25 mg total) rectally at bedtime. Use for 5 nights 03/23/19   Noralyn Pick, NP  mometasone (ELOCON) 0.1 % lotion Apply 3 drops topically See admin instructions. 3 drops into affected ear(s) tic for 2-4 days until itching resolves repeat as needed 04/10/19   [provider]  Multiple Vitamins-Minerals (BARIATRIC MULTIVITAMINS/IRON PO) Take 1 tablet by mouth 3 (three) times daily.    [provider]  naloxegol oxalate (MOVANTIK) 25 MG TABS tablet Take 1 tablet (25 mg total) by mouth daily. 04/19/19   Gatha Mayer, MD  nitrofurantoin, macrocrystal-monohydrate, (MACROBID) 100 MG capsule Take 1 capsule (100 mg total) by mouth 2 (two) times daily. 05/30/19   Rutherford Guys, MD  Omega-3 Fatty Acids (OMEGA-3 FISH OIL PO) Take by mouth daily.    [provider]  omeprazole (PRILOSEC) 20 MG capsule Take 1 capsule (20 mg total) by mouth every morning. 03/23/19   Noralyn Pick, NP  ondansetron (ZOFRAN ODT) 4 MG disintegrating tablet Take 1 tablet (4 mg total) by mouth every 8 (eight) hours as needed for nausea or vomiting. 06/13/19   Gatha Mayer, MD  ondansetron (ZOFRAN ODT) 8 MG disintegrating tablet 8mg  ODT q8 hours prn nausea 06/21/19   Palumbo, April, MD  OVER THE COUNTER MEDICATION [place] root     [provider]  OVER THE COUNTER MEDICATION Calcium, magnesium, zinc with D3 (one tablet daily)    [provider]  OVER THE COUNTER MEDICATION Collagen and vitamin c and biotin (takes one daily)    [provider]  [place] for muscle and joint health    [provider]  polyethylene glycol (MIRALAX / GLYCOLAX) packet Take 17 g by mouth daily as needed (for constipation.).     [provider]  potassium chloride (KLOR-CON) 10 MEQ tablet Take 1 tablet (10 mEq total) by mouth 3 (three) times daily. 06/30/19   Rutherford Guys, MD  PREMARIN vaginal cream Place 1 Applicatorful vaginally 2 (two) times a week.  11/28/18   [provider]  Prucalopride Succinate (MOTEGRITY) 2 MG TABS Take 1 tablet (2 mg total) by mouth daily. 05/23/19   Gatha Mayer, MD  simethicone (MYLICON) 80 MG chewable tablet Chew 80 mg by mouth every 6 (six) hours as needed for flatulence.    [provider]  topiramate (TOPAMAX) 100 MG tablet Take 1.5 tablets (150 mg total) by mouth daily. 01/09/19   Lomax, Amy, NP  triamterene-hydrochlorothiazide (MAXZIDE-25) 37.5-25 MG tablet Take 1 tablet by mouth daily. 05/30/19   Rutherford Guys, MD    Allergies    Sulfa antibiotics, Metoclopramide, and Zosyn [piperacillin sod-tazobactam so]  Review of Systems   Review of Systems  Cardiovascular: Positive for chest pain.  All other systems reviewed and are negative.   Physical Exam Updated Vital Signs BP (!) 130/93 (BP Location: Left Arm)   Pulse 88   Temp 98.2 F (36.8 C) (Oral)   Resp 18   Ht 5\' 8"  (1.727 m)   Wt 83  kg   SpO2 100%   BMI 27.83 kg/m   Physical Exam Vitals and nursing note reviewed.  Constitutional:      General: She is not in acute distress.    Appearance: She is well-developed and normal weight.  HENT:     Head: Normocephalic and atraumatic.     Mouth/Throat:     Mouth: Mucous membranes are moist.  Eyes:      Pupils: Pupils are equal, round, and reactive to light.  Cardiovascular:     Rate and Rhythm: Normal rate and regular rhythm.     Heart sounds: Normal heart sounds. No murmur. No friction rub.  Pulmonary:     Effort: Pulmonary effort is normal.     Breath sounds: Normal breath sounds. No wheezing or rales.  Abdominal:     General: Bowel sounds are normal. There is no distension.     Palpations: Abdomen is soft.     Tenderness: There is no abdominal tenderness. There is no guarding or rebound.  Musculoskeletal:        General: No tenderness. Normal range of motion.     Cervical back: Normal range of motion and neck supple.     Right lower leg: No edema.     Left lower leg: No edema.     Comments: No edema.  Ecchymosis over the left thigh  Skin:    General: Skin is warm and dry.     Findings: No rash.  Neurological:     General: No focal deficit present.     Mental Status: She is alert and oriented to person, place, and time. Mental status is at baseline.     Cranial Nerves: No cranial nerve deficit.  Psychiatric:        Mood and Affect: Mood normal.        Behavior: Behavior normal.        Thought Content: Thought content normal.      ED Results / Procedures / Treatments   Labs (all labs ordered are listed, but only abnormal results are displayed) Labs Reviewed  COMPREHENSIVE METABOLIC PANEL - Abnormal; Notable for the following components:      Result Value   Sodium 134 (*)    Potassium 3.2 (*)    BUN 21 (*)    Creatinine, Ser 1.45 (*)    Calcium 8.3 (*)    GFR calc non Af Amer 42 (*)    GFR calc Af Amer 49 (*)    All other components within normal limits  CBC WITH DIFFERENTIAL/PLATELET  TROPONIN I (HIGH SENSITIVITY)  TROPONIN I (HIGH SENSITIVITY)    EKG EKG Interpretation  Date/Time:  Friday June 30 2019 21:28:17 EDT Ventricular Rate:  73 PR Interval:  142 QRS Duration: 82 QT Interval:  340 QTC Calculation: 374 R Axis:   56 Text Interpretation: Normal  sinus rhythm Possible Left atrial enlargement No significant change since last tracing Confirmed by Blanchie Dessert [id number]) on 06/30/2019 9:36:05 PM   Radiology DG Chest Port 1 View  Result Date: 06/30/2019 CLINICAL DATA:  Palpitations EXAM: PORTABLE CHEST 1 VIEW COMPARISON:  None. FINDINGS: The heart size and mediastinal contours are within normal limits. Both lungs are clear. The visualized skeletal structures are unremarkable. IMPRESSION: No active disease. Electronically Signed   By: Prudencio Pair M.D.   On: 06/30/2019 22:44    Procedures Procedures (including critical care time)  Medications Ordered in ED Medications - No data to display  ED Course  I have reviewed  the triage vital signs and the nursing notes.  Pertinent labs & imaging results that were available during my care of the patient were reviewed by me and considered in my medical decision making (see chart for details).    MDM Rules/Calculators/A&P                      Patient is a 49 year old female presenting today with a story of palpitations, mild chest pain and nausea that started this evening approximately 1 hour prior to arrival.  Patient reports she still has nausea but the palpitations has resolved.  She has no infectious symptoms and low suspicion at this time for pneumonia.  Patient's EKG is within normal limits without signs of ST findings concerning for ACS.  Patient symptoms are unusual for PE or dissection.  Patient does have a history of recurrent hypokalemia may be related to her hydrochlorothiazide but also could be related to her prior gastric bypass and poor absorption.  Patient has not a prescription for potassium but has not picked it up yet.  Patient's chest x-ray today without acute findings.  CBC within normal limits initial troponin is 2 and CMP with persistent creatinine of 1.45 and potassium of 3.2 which is unchanged from over a week ago.  Patient also reports she is just took her last dose of  antibiotic as well so there may be a component of that causing her to not feel well.  Encouraged filling her potassium and drinking lots of fluids.  At this time low suspicion for cardiac cause for her symptoms.  There is been no evidence of dysrhythmia here in heart rate has been 60-80.  Encouraged her to follow-up with her doctor as planned.    MDM Number of Diagnoses or Management Options   Amount and/or Complexity of Data Reviewed Clinical lab tests: ordered and reviewed Tests in the radiology section of CPT: ordered and reviewed Tests in the medicine section of CPT: ordered and reviewed Decide to obtain previous medical records or to obtain history from someone other than the patient: yes Obtain history from someone other than the patient: yes Review and summarize past medical records: yes Discuss the patient with other providers: no Independent visualization of images, tracings, or specimens: yes  Risk of Complications, Morbidity, and/or Mortality Presenting problems: moderate Diagnostic procedures: minimal Management options: minimal  Patient Progress Patient progress: stable  Final Clinical Impression(s) / ED Diagnoses Final diagnoses:  Palpitations  Hypokalemia  Dehydration    Rx / DC Orders ED Discharge Orders    None       Blanchie Dessert, MD 06/30/19 [id number]

## 2019-06-30 NOTE — Telephone Encounter (Signed)
Pt requests call back to advise if she needs an appt or to advise her of what she should be doing.

## 2019-06-30 NOTE — Therapy (Signed)
[facility] [address] [place], [place], [address] Phone: [phone]   Fax:  [phone]  Physical Therapy Treatment  Patient Details  Name: Margaret Fletcher MRN: [id number] Date of Birth: 08/24/1970 Referring Provider (PT): Gatha Mayer, MD   Encounter Date: 06/30/2019  PT End of Session - 06/30/19 0855    Visit Number  6    Number of Visits  13    Date for PT Re-Evaluation  08/07/19    Authorization Type  [employer]    PT Start Time  0850    PT Stop Time  0928    PT Time Calculation (min)  38 min    Activity Tolerance  Patient limited by pain    Behavior During Therapy  [facility] for tasks assessed/performed       Past Medical History:  Diagnosis Date  . Adie's pupil, left   . Alopecia   . Anxiety   . Autoimmune disease ([place])    "Alopecia"  . Bacterial vaginosis   . Cyst of spleen   . Depression   . Disorder of oral soft tissue 03/17/2019  . Frequent UTI   . Hypertension   . Hypertrophy of breast   . IIH (idiopathic intracranial hypertension) 2017   "related to headaches"  . Injury of jawline 03/17/2019  . Migraine headache    denies, ruled out - Propranolol.Using for headaches  . Non-suppurative otitis media 03/17/2019    Past Surgical History:  Procedure Laterality Date  . ANAL RECTAL MANOMETRY N/A 06/02/2019   Procedure: ANO RECTAL MANOMETRY;  Surgeon: Gatha Mayer, MD;  Location: [facility];  Service: Endoscopy;  Laterality: N/A;  . APPENDECTOMY    . BREAST EXCISIONAL BIOPSY Right 2017   removed at time of reduction  . BREAST REDUCTION SURGERY Bilateral 03/26/2015   Procedure: BILATERAL BREAST REDUCTION  ;  Surgeon: Youlanda Roys, MD;  Location: [place];  Service: Plastics;  Laterality: Bilateral;  . BREATH TEK H PYLORI N/A 05/21/2014   Procedure: BREATH TEK H PYLORI;  Surgeon: Greer Pickerel, MD;  Location: [facility];  Service: General;  Laterality: N/A;  .  CELIAC PLEXUS BLOCK  07/18/2018   Per patient it was done at [place]    . CESAREAN SECTION    . CHOLECYSTECTOMY    . DILITATION & CURRETTAGE/HYSTROSCOPY WITH NOVASURE ABLATION N/A 07/19/2012   Procedure: HYSTEROSCOPY WITH NOVASURE ABLATION;  Surgeon: Farrel Gobble. Harrington Challenger, MD;  Location: [facility];  Service: Gynecology;  Laterality: N/A;  . ENDOMETRIAL ABLATION    . ESOPHAGOGASTRODUODENOSCOPY N/A 08/16/2015   Procedure: UPPER ESOPHAGOGASTRODUODENOSCOPY (EGD);  Surgeon: Greer Pickerel, MD;  Location: [facility];  Service: General;  Laterality: N/A;  . ESOPHAGOGASTRODUODENOSCOPY N/A 02/21/2016   Procedure: ESOPHAGOGASTRODUODENOSCOPY (EGD);  Surgeon: Greer Pickerel, MD;  Location: [facility];  Service: General;  Laterality: N/A;  . ESOPHAGOGASTRODUODENOSCOPY N/A 02/24/2017   Procedure: ESOPHAGOGASTRODUODENOSCOPY (EGD);  Surgeon: Greer Pickerel, MD;  Location: [facility];  Service: General;  Laterality: N/A;  . IR GENERIC HISTORICAL  10/04/2015   IR [place] Vivianne Master 10/04/2015 Markus Daft, MD [facility]  . IR GENERIC HISTORICAL  01/31/2016   IR [place] Colon GASTRO/COLONIC TUBE PERCUT W/FLUORO 01/31/2016 Corrie Mckusick, DO [facility]  . IR GENERIC HISTORICAL  04/06/2016   IR [place] TUBE PERCUT W/FLUORO 04/06/2016 Arne Cleveland, MD [facility]  . IR GENERIC HISTORICAL  05/14/2016   IR GASTRIC TUBE PERC CHG W/O IMG  GUIDE 05/14/2016 Sandi Mariscal, MD [facility]  . LAPAROSCOPIC GASTROSTOMY N/A 09/30/2015   Procedure: LAPAROSCOPIC GASTROSTOMY TUBE PLACEMENT;  Surgeon: Greer Pickerel, MD;  Location: [facility];  Service: General;  Laterality: N/A;  . LAPAROSCOPIC ROUX-EN-Y GASTRIC BYPASS WITH HIATAL HERNIA REPAIR N/A 07/08/2015   Procedure: LAPAROSCOPIC ROUX-EN-Y GASTRIC BYPASS WITH HIATAL HERNIA REPAIR WITH UPPER ENDOSCOPY;  Surgeon: Greer Pickerel, MD;  Location: [facility];  Service: General;  Laterality: N/A;  . LAPAROSCOPIC SMALL BOWEL RESECTION N/A 09/30/2015   Procedure:  LAPAROSCOPIC REVISION OF ROUX LIMB;  Surgeon: Greer Pickerel, MD;  Location: [facility];  Service: General;  Laterality: N/A;  . LAPAROSCOPY N/A 09/30/2015   Procedure: LAPAROSCOPY DIAGNOSTIC;  Surgeon: Greer Pickerel, MD;  Location: [facility];  Service: General;  Laterality: N/A;  . REDUCTION MAMMAPLASTY Bilateral 2017  . TUBAL LIGATION    . WISDOM TOOTH EXTRACTION      There were no vitals filed for this visit.  Subjective Assessment - 06/30/19 0902    Subjective  I am starting to feel when I have to go but still can't always go. Much less pain almost nothing.    Patient Stated Goals  decreased pain overall, able to have a BM    Currently in Pain?  Yes    Pain Score  2     Pain Location  Abdomen    Pain Orientation  Lower    Pain Descriptors / Indicators  Aching;Dull    Pain Onset  More than a month ago    Multiple Pain Sites  No                       [facility] Adult PT Treatment/Exercise - 06/30/19 0001      Neuro Re-ed    Neuro Re-ed Details   breathing and bulging with stretches      Lumbar Exercises: Stretches   Active Hamstring Stretch  Right;Left;1 rep;60 seconds    Double Knee to Chest Stretch  3 reps;30 seconds    Figure 4 Stretch  3 reps;30 seconds    Gastroc Stretch  1 rep;60 seconds      Lumbar Exercises: Aerobic   Nustep  L2 x 10 min - PT present to cue activation of core             PT Education - 06/30/19 0932    Education Details  Access Code: [email or web address]    Person(s) Educated  Patient    Methods  Explanation;Demonstration;Tactile cues;Verbal cues;Handout    Comprehension  Verbalized understanding;Returned demonstration       PT Short Term Goals - 06/20/19 0935      PT SHORT TERM GOAL #1   Title  Pt will be Ind c an initial HEP and toileting techniques    Baseline  used toileting techniques successfully today    Status  Achieved        PT Long Term Goals - 06/12/19 1048      PT LONG TERM GOAL #1   Title  Pt will be Ind c a final HEP to  improve bowel function    Time  8    Period  Weeks    Status  New    Target Date  08/07/19      PT LONG TERM GOAL #2   Title  Pt will demonstrate understanding of postures and activities which have positive and negative affects on her pain.    Time  8    Period  Weeks  Status  New    Target Date  08/07/19      PT LONG TERM GOAL #3   Title  Pt will report a pain range of 0-3/10 with functional activities such as eating and BM    Baseline  5-10/10    Time  8    Period  Weeks    Status  New    Target Date  08/07/19      PT LONG TERM GOAL #4   Title  Pt will demonstrate pelvic floor strength of 3/5 and able to hold for at least 15 seconds for improved intra-abdominal pressure during activities such as walking    Time  8    Period  Weeks    Status  New    Target Date  08/07/19      PT LONG TERM GOAL #5   Title  Pt will be able to have BM and empty bladder with minimal to no bearing down for reduced stress on her GI tract.    Time  8    Period  Weeks    Status  New    Target Date  08/07/19            Plan - 06/30/19 0931    Clinical Impression Statement  Pt needed cues to hinge at her hips for hamstring stretch.  Pt was educated in and performed stretches with breathing for lengthening pelvic floor muscles.  She did well and able to add to HEP today.    Examination-Activity Limitations  Bed Mobility;Bathing;Lift;Bend;Carry;Dressing;Sleep;Reach Overhead;Stand;Sit;Transfers;Toileting;Continence    PT Treatment/Interventions  ADLs/Self Care Home Management;Electrical Stimulation;Iontophoresis 4mg /ml Dexamethasone;Traction;Moist Heat;Ultrasound;Gait training;Functional mobility training;Balance training;Therapeutic exercise;Therapeutic activities;Patient/family education;Manual techniques;Passive range of motion;Dry needling;Taping;Energy conservation;Biofeedback;Cryotherapy;Neuromuscular re-education    PT Next Visit Plan  TrA activation; core strength progression, f/u on  toileting with relax on inhale    PT Home Exercise Plan  [id number]; added [id number] for pelvic floor    Consulted and Agree with Plan of Care  Patient       Patient will benefit from skilled therapeutic intervention in order to improve the following deficits and impairments:  Improper body mechanics, Pain, Decreased mobility, Decreased activity tolerance, Decreased range of motion, Impaired UE functional use, Decreased balance, Decreased coordination, Increased fascial restricitons, Impaired tone, Decreased strength  Visit Diagnosis: Unspecified lack of coordination  Muscle weakness (generalized)  Cramp and spasm     Problem List Patient Active Problem List   Diagnosis Date Noted  . Urinary frequency 06/20/2019  . Urinary urgency 06/20/2019  . Weakness of pelvic floor 06/20/2019  . Constipation due to outlet dysfunction   . Essential hypertension, benign 05/30/2019  . 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 History of fall 04/10/2019  . Spondylosis, cervical, with myelopathy 03/17/2019  . 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 Low back pain 03/17/2019  . Non-suppurative otitis media 03/17/2019  . Injury of jawline 03/17/2019  . Disorder of oral soft tissue 03/17/2019  . Reactive depression 06/13/2017  . Chronic pain syndrome 06/13/2017  . Polypharmacy 06/13/2017  . Empty sella syndrome ([place]) 05/01/2017  . Adjustment disorder with mixed anxiety and depressed mood 10/13/2016  . 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 Hypokalemia 02/22/2016  . Constipation, chronic 02/22/2016  . Memory loss due to medical condition 12/14/2015  . Chronic fatigue 12/14/2015  . Recurrent dehydration 09/30/2015  . Chronic nausea 08/23/2015  . Abdominal pain, chronic, epigastric 08/20/2015  . Protein-calorie malnutrition, moderate ([place]) 08/19/2015  . Mild  obstructive sleep apnea 07/08/2015  . Dyslipidemia 07/08/2015  . S/P gastric bypass 07/08/2015  . IIH  (idiopathic intracranial hypertension) 06/03/2015  . Vision changes 04/30/2015  . Perceived hearing changes 04/30/2015  . Chronic daily headache 04/21/2014  . Liver lesion 03/21/2013  . Splenic cyst 03/21/2013    Jule Ser, PT 06/30/2019, 10:43 AM  [facility] [address] [place], [place], [address] Phone: [phone]   Fax:  [phone]  Name: Margaret Fletcher MRN: [id number] Date of Birth: September 05, 1970

## 2019-06-30 NOTE — ED Triage Notes (Signed)
Pt c/o CP and heart racing started ~8pm-NAD-steady gait

## 2019-06-30 NOTE — Patient Instructions (Signed)
Access Code: [email or web address] URL: [email or web address] Date: 06/30/2019 Prepared by: Jari Favre  Exercises Supine Diaphragmatic Breathing with Pelvic Floor Lengthening - 3 x daily - 7 x weekly - 10 reps - 1 sets Seated Hamstring Stretch - 1 x daily - 7 x weekly - 3 reps - 1 sets - 30 sec hold Seated Transversus Abdominis Bracing - 1 x daily - 7 x weekly - 10 reps - 3 sets Supine Piriformis Stretch - 1 x daily - 7 x weekly - 3 reps - 1 sets - 30 sec hold Supine Double Knee to Chest - 1 x daily - 7 x weekly - 1 sets - 3 reps - 30 sec hold Supine Lower Trunk Rotation - 1 x daily - 7 x weekly - 10 reps - 1 sets - 5 sec hold Sidelying Thoracic Rotation with Open Book - 1 x daily - 7 x weekly - 5 reps - 1 sets - 10 sec hold

## 2019-07-01 ENCOUNTER — Encounter (HOSPITAL_COMMUNITY): Payer: Self-pay | Admitting: Psychiatry

## 2019-07-01 ENCOUNTER — Telehealth (INDEPENDENT_AMBULATORY_CARE_PROVIDER_SITE_OTHER): Payer: Medicare HMO | Admitting: Psychiatry

## 2019-07-01 DIAGNOSIS — F4323 Adjustment disorder with mixed anxiety and depressed mood: Secondary | ICD-10-CM

## 2019-07-01 DIAGNOSIS — F411 Generalized anxiety disorder: Secondary | ICD-10-CM | POA: Diagnosis not present

## 2019-07-01 DIAGNOSIS — R5382 Chronic fatigue, unspecified: Secondary | ICD-10-CM | POA: Diagnosis not present

## 2019-07-01 DIAGNOSIS — Z9884 Bariatric surgery status: Secondary | ICD-10-CM | POA: Diagnosis not present

## 2019-07-01 DIAGNOSIS — F331 Major depressive disorder, recurrent, moderate: Secondary | ICD-10-CM

## 2019-07-01 LAB — TROPONIN I (HIGH SENSITIVITY): Troponin I (High Sensitivity): 3 ng/L (ref <18)

## 2019-07-01 MED ORDER — BUPROPION HCL ER (SR) 100 MG PO TB12: 100.0000 mg | ORAL_TABLET | Freq: Every day | ORAL | 0 refills | Status: DC

## 2019-07-01 MED ORDER — BUSPIRONE HCL 5 MG PO TABS: 5.0000 mg | ORAL_TABLET | Freq: Three times a day (TID) | ORAL | 0 refills | Status: DC

## 2019-07-01 MED FILL — buPROPion HCL ER (SR) 100 M: 100 | 30 days supply | Qty: 30 | Fill #0

## 2019-07-01 MED FILL — busPIRone HCL 5 MG TABS: 5 | 30 days supply | Qty: 90 | Fill #0
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
## 2019-07-03 ENCOUNTER — Other Ambulatory Visit: Payer: Self-pay

## 2019-07-03 ENCOUNTER — Ambulatory Visit (INDEPENDENT_AMBULATORY_CARE_PROVIDER_SITE_OTHER): Payer: Medicare HMO | Admitting: Family Medicine

## 2019-07-03 DIAGNOSIS — E876 Hypokalemia: Secondary | ICD-10-CM | POA: Diagnosis not present

## 2019-07-03 LAB — BASIC METABOLIC PANEL
BUN/Creatinine Ratio: 15 (ref 9–23)
BUN: 22 mg/dL (ref 6–24)
CO2: 21 mmol/L (ref 20–29)
Calcium: 8.8 mg/dL (ref 8.7–10.2)
Chloride: 104 mmol/L (ref 96–106)
Creatinine, Ser: 1.48 mg/dL — ABNORMAL HIGH (ref 0.57–1.00)
GFR calc Af Amer: 48 mL/min/{1.73_m2} — ABNORMAL LOW (ref >59)
GFR calc non Af Amer: 41 mL/min/{1.73_m2} — ABNORMAL LOW (ref >59)
Glucose: 99 mg/dL (ref 65–99)
Potassium: 3.9 mmol/L (ref 3.5–5.2)
Sodium: 140 mmol/L (ref 134–144)

## 2019-07-04 ENCOUNTER — Encounter: Payer: Self-pay | Admitting: Physical Therapy

## 2019-07-04 ENCOUNTER — Ambulatory Visit: Payer: Medicare HMO | Admitting: Physical Therapy

## 2019-07-04 DIAGNOSIS — R279 Unspecified lack of coordination: Secondary | ICD-10-CM

## 2019-07-04 DIAGNOSIS — R252 Cramp and spasm: Secondary | ICD-10-CM | POA: Diagnosis not present

## 2019-07-04 DIAGNOSIS — M6281 Muscle weakness (generalized): Secondary | ICD-10-CM | POA: Diagnosis not present

## 2019-07-04 NOTE — Therapy (Signed)
[facility] [address] [place], [place], [address] Phone: [phone]   Fax:  [phone]  Physical Therapy Treatment  Patient Details  Name: Margaret Fletcher MRN: [id number] Date of Birth: Oct 22, 1970 Referring Provider (PT): Gatha Mayer, MD   Encounter Date: 07/04/2019  PT End of Session - 07/04/19 0814    Visit Number  7    Number of Visits  13    Date for PT Re-Evaluation  08/07/19    Authorization Type  [employer]    PT Start Time  [phone]   arrived late   PT Stop Time  0842    PT Time Calculation (min)  30 min    Activity Tolerance  Patient limited by pain    Behavior During Therapy  [facility] for tasks assessed/performed       Past Medical History:  Diagnosis Date  . Adie's pupil, left   . Alopecia   . Anxiety   . Autoimmune disease ([place])    "Alopecia"  . Bacterial vaginosis   . Cyst of spleen   . Depression   . Disorder of oral soft tissue 03/17/2019  . Frequent UTI   . Hypertension   . Hypertrophy of breast   . IIH (idiopathic intracranial hypertension) 2017   "related to headaches"  . Injury of jawline 03/17/2019  . Migraine headache    denies, ruled out - Propranolol.Using for headaches  . Non-suppurative otitis media 03/17/2019    Past Surgical History:  Procedure Laterality Date  . ANAL RECTAL MANOMETRY N/A 06/02/2019   Procedure: ANO RECTAL MANOMETRY;  Surgeon: Gatha Mayer, MD;  Location: [facility];  Service: Endoscopy;  Laterality: N/A;  . APPENDECTOMY    . BREAST EXCISIONAL BIOPSY Right 2017   removed at time of reduction  . BREAST REDUCTION SURGERY Bilateral 03/26/2015   Procedure: BILATERAL BREAST REDUCTION  ;  Surgeon: Youlanda Roys, MD;  Location: [facility];  Service: Plastics;  Laterality: Bilateral;  . BREATH TEK H PYLORI N/A 05/21/2014   Procedure: BREATH TEK H PYLORI;  Surgeon: Greer Pickerel, MD;  Location: [facility];  Service: General;   Laterality: N/A;  . CELIAC PLEXUS BLOCK  07/18/2018   Per patient it was done at [place]    . CESAREAN SECTION    . CHOLECYSTECTOMY    . DILITATION & CURRETTAGE/HYSTROSCOPY WITH NOVASURE ABLATION N/A 07/19/2012   Procedure: HYSTEROSCOPY WITH NOVASURE ABLATION;  Surgeon: Farrel Gobble. Harrington Challenger, MD;  Location: [facility];  Service: Gynecology;  Laterality: N/A;  . ENDOMETRIAL ABLATION    . ESOPHAGOGASTRODUODENOSCOPY N/A 08/16/2015   Procedure: UPPER ESOPHAGOGASTRODUODENOSCOPY (EGD);  Surgeon: Greer Pickerel, MD;  Location: [facility];  Service: General;  Laterality: N/A;  . ESOPHAGOGASTRODUODENOSCOPY N/A 02/21/2016   Procedure: ESOPHAGOGASTRODUODENOSCOPY (EGD);  Surgeon: Greer Pickerel, MD;  Location: [facility];  Service: General;  Laterality: N/A;  . ESOPHAGOGASTRODUODENOSCOPY N/A 02/24/2017   Procedure: ESOPHAGOGASTRODUODENOSCOPY (EGD);  Surgeon: Greer Pickerel, MD;  Location: [facility];  Service: General;  Laterality: N/A;  . IR GENERIC HISTORICAL  10/04/2015   IR [place] Vivianne Master 10/04/2015 Markus Daft, MD [facility]  . IR GENERIC HISTORICAL  01/31/2016   IR [place] GASTRO/COLONIC TUBE PERCUT W/FLUORO 01/31/2016 Corrie Mckusick, DO [facility]  . IR GENERIC HISTORICAL  04/06/2016   IR [place] TUBE PERCUT W/FLUORO 04/06/2016 Arne Cleveland, MD [facility]  . IR GENERIC HISTORICAL  05/14/2016   IR GASTRIC TUBE PERC  CHG W/O IMG GUIDE 05/14/2016 Sandi Mariscal, MD [facility]  . LAPAROSCOPIC GASTROSTOMY N/A 09/30/2015   Procedure: LAPAROSCOPIC GASTROSTOMY TUBE PLACEMENT;  Surgeon: Greer Pickerel, MD;  Location: [facility];  Service: General;  Laterality: N/A;  . LAPAROSCOPIC ROUX-EN-Y GASTRIC BYPASS WITH HIATAL HERNIA REPAIR N/A 07/08/2015   Procedure: LAPAROSCOPIC ROUX-EN-Y GASTRIC BYPASS WITH HIATAL HERNIA REPAIR WITH UPPER ENDOSCOPY;  Surgeon: Greer Pickerel, MD;  Location: [facility];  Service: General;  Laterality: N/A;  . LAPAROSCOPIC SMALL BOWEL RESECTION N/A  09/30/2015   Procedure: LAPAROSCOPIC REVISION OF ROUX LIMB;  Surgeon: Greer Pickerel, MD;  Location: [facility];  Service: General;  Laterality: N/A;  . LAPAROSCOPY N/A 09/30/2015   Procedure: LAPAROSCOPY DIAGNOSTIC;  Surgeon: Greer Pickerel, MD;  Location: [facility];  Service: General;  Laterality: N/A;  . REDUCTION MAMMAPLASTY Bilateral 2017  . TUBAL LIGATION    . WISDOM TOOTH EXTRACTION      There were no vitals filed for this visit.  Subjective Assessment - 07/04/19 0816    Subjective  I had a BM but had to use and enema and after the really hard balls and it was a skinny in diameter    Patient Stated Goals  decreased pain overall, able to have a BM    Currently in Pain?  No/denies                       [facility] Adult PT Treatment/Exercise - 07/04/19 0001      Neuro Re-ed    Neuro Re-ed Details   breathing and bulging pelvic floor - cues to blow like blowing a balloon      Manual Therapy   Manual Therapy  Internal Pelvic Floor    Manual therapy comments  internal STM with identity confirmed and consent given    Internal Pelvic Floor  stretch and contract relax to anal sphincters and puborectalis               PT Short Term Goals - 06/20/19 0935      PT SHORT TERM GOAL #1   Title  Pt will be Ind c an initial HEP and toileting techniques    Baseline  used toileting techniques successfully today    Status  Achieved        PT Long Term Goals - 06/12/19 1048      PT LONG TERM GOAL #1   Title  Pt will be Ind c a final HEP to improve bowel function    Time  8    Period  Weeks    Status  New    Target Date  08/07/19      PT LONG TERM GOAL #2   Title  Pt will demonstrate understanding of postures and activities which have positive and negative affects on her pain.    Time  8    Period  Weeks    Status  New    Target Date  08/07/19      PT LONG TERM GOAL #3   Title  Pt will report a pain range of 0-3/10 with functional activities such as eating and BM     Baseline  5-10/10    Time  8    Period  Weeks    Status  New    Target Date  08/07/19      PT LONG TERM GOAL #4   Title  Pt will demonstrate pelvic floor strength of 3/5 and able to hold for at least  15 seconds for improved intra-abdominal pressure during activities such as walking    Time  8    Period  Weeks    Status  New    Target Date  08/07/19      PT LONG TERM GOAL #5   Title  Pt will be able to have BM and empty bladder with minimal to no bearing down for reduced stress on her GI tract.    Time  8    Period  Weeks    Status  New    Target Date  08/07/19            Plan - 07/04/19 0820    Clinical Impression Statement  Today's session focused on relaxing pelvic floor and techniques to coordinate pelvic floor and be able to bulge for improving bowel movements.  Pt did well with internal STM and tactile cues and VC to breathe and bulgle. She was able to bulge and feel the difference of when she tightens verses relaxes.    PT Treatment/Interventions  ADLs/Self Care Home Management;Electrical Stimulation;Iontophoresis 4mg /ml Dexamethasone;Traction;Moist Heat;Ultrasound;Gait training;Functional mobility training;Balance training;Therapeutic exercise;Therapeutic activities;Patient/family education;Manual techniques;Passive range of motion;Dry needling;Taping;Energy conservation;Biofeedback;Cryotherapy;Neuromuscular re-education    PT Next Visit Plan  TrA activation; core strength progression, f/u on toileting with blowing balloon on exhale    PT Home Exercise Plan  [id number]; added [id number] for pelvic floor    Consulted and Agree with Plan of Care  Patient       Patient will benefit from skilled therapeutic intervention in order to improve the following deficits and impairments:  Improper body mechanics, Pain, Decreased mobility, Decreased activity tolerance, Decreased range of motion, Impaired UE functional use, Decreased balance, Decreased coordination, Increased fascial  restricitons, Impaired tone, Decreased strength  Visit Diagnosis: Unspecified lack of coordination  Muscle weakness (generalized)  Cramp and spasm     Problem List Patient Active Problem List   Diagnosis Date Noted  . Urinary frequency 06/20/2019  . Urinary urgency 06/20/2019  . Weakness of pelvic floor 06/20/2019  . Constipation due to outlet dysfunction   . Essential hypertension, benign 05/30/2019  . 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 History of fall 04/10/2019  . Spondylosis, cervical, with myelopathy 03/17/2019  . 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 Low back pain 03/17/2019  . Non-suppurative otitis media 03/17/2019  . Injury of jawline 03/17/2019  . Disorder of oral soft tissue 03/17/2019  . Reactive depression 06/13/2017  . Chronic pain syndrome 06/13/2017  . Polypharmacy 06/13/2017  . Empty sella syndrome ([place]) 05/01/2017  . Adjustment disorder with mixed anxiety and depressed mood 10/13/2016  . 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 Hypokalemia 02/22/2016  . Constipation, chronic 02/22/2016  . Memory loss due to medical condition 12/14/2015  . Chronic fatigue 12/14/2015  . Recurrent dehydration 09/30/2015  . Chronic nausea 08/23/2015  . Abdominal pain, chronic, epigastric 08/20/2015  . Protein-calorie malnutrition, moderate ([place]) 08/19/2015  . Mild obstructive sleep apnea 07/08/2015  . Dyslipidemia 07/08/2015  . S/P gastric bypass 07/08/2015  . IIH (idiopathic intracranial hypertension) 06/03/2015  . Vision changes 04/30/2015  . Perceived hearing changes 04/30/2015  . Chronic daily headache 04/21/2014  . Liver lesion 03/21/2013  . Splenic cyst 03/21/2013    Jule Ser, PT 07/04/2019, 8:45 AM  [facility] [address] [place], [place], [address] Phone: [phone]   Fax:  [phone]  Name: Margaret Fletcher MRN: [id number] Date of  Birth:  07/16/1970   

## 2019-07-06 ENCOUNTER — Ambulatory Visit: Payer: Medicare HMO | Admitting: Physical Therapy

## 2019-07-07 ENCOUNTER — Encounter: Payer: Self-pay | Admitting: Family Medicine

## 2019-07-07 NOTE — Telephone Encounter (Signed)
Pt can have her sickle cell clinic for her infusions and wants to know if she is supposed to be seeing her surgeon still as she was under the impression she did not go back to him once her feeding tube was out.  Please advise

## 2019-07-07 NOTE — Telephone Encounter (Signed)
Pt is asking if she needs to continue the potassium supplement or if eating potassium rich foods will be sufficient now. I see her level is the same a few days ago as it was two weeks ago.  She needs a refill of potassium if you would like her to continue.

## 2019-07-10 ENCOUNTER — Encounter: Payer: Self-pay | Admitting: Family Medicine

## 2019-07-10 ENCOUNTER — Ambulatory Visit: Payer: Medicare Other | Admitting: Family Medicine

## 2019-07-10 NOTE — Progress Notes (Deleted)
PATIENT: Margaret Fletcher DOB: 02-Nov-1970  REASON FOR VISIT: follow up HISTORY FROM: patient  No chief complaint on file.    HISTORY OF PRESENT ILLNESS: Today 07/10/19 Margaret Fletcher is a 49 y.o. female here today for follow up.   HISTORY: (copied from my note on 01/09/2019)  Margaret Fletcher is a 49 y.o. female here today for follow up. She continues topiramate 150mg  daily as well as amitriptyline 10mg  at night for migraine prevention. She reports that headaches are now occurring about 2-3 times a week. She continues to have about 2 migraines a week. She is going through a full prescription of Relpax each month. She has weaned Excedrin and is not taking any other OTC medications for headaches. She reports that eye exam this year was normal. I will request report today. She denies vision changes. She was started on Maxide for BP management and feels that this has helped. She continues Buprenorphine film 300mg  every 12 hours for pain and clonazepam 0.5mg  daily for anxiety. She continues Pristiq for mood. She reports that her PCP left the practice. She has not been able to get refills of her Adderall. She was referred to psychiatry for continued management. She is on Xulane patch for hot flashes and dysmenorrhea. She has an allergy to sulfa and cannot take Diamox.   HISTORY: (copied from my note on 10/06/2018)  Margaret Fletcher a 49 y.o.femalehere today for follow up for migraines. She has not been seen since 02/2017 due to loss of insurance coverage. She has recently been approved for disability.   She has had increased frequency of headaches and migraines. She has history of IIH and Adie's eye. Headaches are daily with migraines occurring 2-3 times a week. She has noted a runny nose with headaches and feels pressure behind her eyes. She increased her dose of topiramate from 75mg  to 100mg  "a while ago." Last RX written from Dr Pamella Pert was for 45 tablets and 2 refills.  She reports that she would get refills early. She is also taking amitriptyline 20mg . She is using a full rx of Relpax every month, sometimes in the first three weeks. Last rx written 03/23/2018 for 10 tablets with 2 refills. She very rarely uses Excedrin, only if she has to. She reports recent eye exam was normal with no concerns of optic nerve swelling.   She is s/p bypass surgery. 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 She is taking buprenorphine 33mcg daily. She is taking clonazepam 0.5mg  daily. Pristiq for depression.   She does not feel her BP has been elevated in the past. She notes elevation today but feels it is related to pain. She has noted dizziness and vertiginous symptoms. Dizziness sometimes when standing up. She reports being well hydrated. She reports concerns of hypoglycemia in absence of diagnosis of diabetes. Readings fluctuate between 50-160.   HISTORY: (copied fromMegan Millikan'snote on 02/22/2017)  Today12/17/18]Margaret Fletcher is a 49 year old female with a history of Adie's eyeand migraine headaches. She returns today for follow-up. She states in November she developed a severe headache. She states that it was accompanied by heaviness in the right arm and in the legs. She states that she had trouble holding things in the right hand. She went to the emergency room and diagnosed with a complex migraine. She reports that once her headache was better theother symptoms improved as well. She states that since her  visit to the ED in November she has essentially has had a headache daily. She reports that some days her headaches are worse than others. Had a sleep study in the past that did not show sleep apnea. She is currently on Topamax 50 mg at bedtime and amitriptyline 20 mg at bedtime. She reports that her primary care recently put her on Pristiq for depression. She  continues to use Relpax for her headaches. In the past the patient's topamaxdose was decreased due to worsening fatigue,depression and memory problems. She does feel that since her Topamax dose has been decreased her headaches have gotten worse. She remains on amitriptyline 20 mg at bedtime. She does report that it makes her feel very groggy the next morning. She returns today for an evaluation.  HISTORY7/9/18:Margaret L Hayesis a 49 y.o.femalehere as a referral from Dr. Brain Hilts dilated pupil. She was diagnosed with Adie's eye. She had a headache, some headache, ear congestion and then noticed that her pupil was dilated. Her eye gets sore even now. She has lost 120 pounds. Her headaches are better. It is hard to eat. She was denied for disability. Itis in appeal. She applied for medicaid. She wears sunglasses. She is having blurry vision in the eye. The left eye is blurry. She was evaluated by ophthalmology. Going outside is hard. No improvement. She takes 150mg  Topiramate daily. She is still having headaches every day, she takes eletriptan, She takes the eletriptan when the headaches are migrainous. She has daily headaches that last all day long. She is under a lot of stress, headaches better when it is dark, the headache feels like eye strain.  Reviewed notes, labs and imaging from outside physicians, which showed:   CT headshowed No acute intracranial abnormalities including mass lesion or mass effect, hydrocephalus, extra-axial fluid collection, midline shift, hemorrhage, or acute infarction, large ischemic events (personally reviewed images)   hgba1c 5.3, RPR NR   REVIEW OF SYSTEMS: Out of a complete 14 system review of symptoms, the patient complains only of the following symptoms, and all other reviewed systems are negative.  ALLERGIES: Allergies  Allergen Reactions   Sulfa Antibiotics Anaphylaxis   Metoclopramide Other (See Comments)    Elevated prolactin >5x ULN  induction lactation when taking scheduled for several months   Zosyn [Piperacillin Sod-Tazobactam So] Rash    Has patient had a PCN reaction causing immediate rash, facial/tongue/throat swelling, SOB or lightheadedness with hypotension: No Has patient had a PCN reaction causing severe rash involving mucus membranes or skin necrosis: No Has patient had a PCN reaction that required hospitalization: Unknown--inpatient when reaction occurred Has patient had a PCN reaction occurring within the last 10 years: Yes If all of the above answers are "NO", then may proceed with Cephalosporin use.     HOME MEDICATIONS: Outpatient Medications Prior to Visit  Medication Sig Dispense Refill   albuterol (PROVENTIL HFA;VENTOLIN HFA) 108 (90 Base) MCG/ACT inhaler Inhale 2 puffs into the lungs every 6 (six) hours as needed for wheezing or shortness of breath. 1 Inhaler 0   amitriptyline (ELAVIL) 10 MG tablet TAKE 2 TABLETS BY MOUTH AT BEDTIME. (Patient taking differently: Take 20 mg by mouth at bedtime. ) 60 tablet 2   azelastine (ASTELIN) 0.1 % nasal spray Place 1 spray into both nostrils 2 (two) times daily. Use in each nostril as directed 30 mL 0   BAYER MICROLET LANCETS lancets Use as instructed 100 each 12   BELBUCA 450 MCG FILM Take 450 mcg by mouth daily  as needed (breakthrough pain).      BELBUCA 600 MCG FILM Take 1 strip by mouth 2 (two) times daily.     buPROPion (WELLBUTRIN SR) 100 MG 12 hr tablet Take 1 tablet (100 mg total) by mouth daily. 30 tablet 0   busPIRone (BUSPAR) 5 MG tablet Take 1 tablet (5 mg total) by mouth 3 (three) times daily. 90 tablet 0   CALCIUM-VITAMIN D PO Take 1 tablet by mouth 2 (two) times daily. chewable     cetirizine (ZYRTEC) 10 MG tablet Take 1 tablet (10 mg total) by mouth daily. (Patient taking differently: Take 10 mg by mouth daily as needed for allergies. ) 30 tablet 11   chlorhexidine (PERIDEX) 0.12 % solution Use as directed 15 mLs in the mouth or throat 2  (two) times daily. 120 mL 0   Cyanocobalamin (VITAMIN B-12 SL) Place 1 drop under the tongue daily. 1 dropperful     cyclobenzaprine (FLEXERIL) 5 MG tablet TAKE 1 TABLET BY MOUTH 3 TIMES DAILY AS NEEDED FOR MUSCLE SPASMS. MAY TAKE 2 TABS BY MOUTH AT BEDTIME (Patient taking differently: Take 5-10 mg by mouth See admin instructions. TAKE 5mg  BY MOUTH 3 TIMES DAILY AS NEEDED FOR MUSCLE SPASMS. MAY TAKE 10mg  BY MOUTH AT BEDTIME) 40 tablet 2   desvenlafaxine (PRISTIQ) 100 MG 24 hr tablet TAKE 1 TABLET BY MOUTH ONCE A DAY (Patient taking differently: Take 100 mg by mouth daily. ) 90 tablet 0   dicyclomine (BENTYL) 20 MG tablet Take 1 tablet (20 mg total) by mouth 2 (two) times daily. 20 tablet 0   docusate sodium (COLACE) 100 MG capsule Take 100 mg by mouth daily as needed.      eletriptan (RELPAX) 40 MG tablet Take 1 tablet (40 mg total) by mouth every 2 (two) hours as needed for migraine or headache. Do not use >2 doses/24 hours 10 tablet 5   famotidine (PEPCID) 20 MG tablet Take 1 tablet (20 mg total) by mouth 2 (two) times daily. 60 tablet 2   fluticasone (FLONASE) 50 MCG/ACT nasal spray Place 2 sprays into both nostrils daily. (Patient taking differently: Place 2 sprays into both nostrils daily as needed for allergies. ) 16 g 6   glucose blood (CONTOUR NEXT TEST) test strip 1 each by Other route 4 (four) times daily as needed (hypoglycemia). K91.2 270 each 5   hydrocortisone (ANUSOL-HC) 25 MG suppository Place 1 suppository (25 mg total) rectally at bedtime. Use for 5 nights 5 suppository 0   mometasone (ELOCON) 0.1 % lotion Apply 3 drops topically See admin instructions. 3 drops into affected ear(s) tic for 2-4 days until itching resolves repeat as needed     Multiple Vitamins-Minerals (BARIATRIC MULTIVITAMINS/IRON PO) Take 1 tablet by mouth 3 (three) times daily.     naloxegol oxalate (MOVANTIK) 25 MG TABS tablet Take 1 tablet (25 mg total) by mouth daily. 30 tablet 1   nitrofurantoin,  macrocrystal-monohydrate, (MACROBID) 100 MG capsule Take 1 capsule (100 mg total) by mouth 2 (two) times daily. 20 capsule 0   Omega-3 Fatty Acids (OMEGA-3 FISH OIL PO) Take by mouth daily.     omeprazole (PRILOSEC) 20 MG capsule Take 1 capsule (20 mg total) by mouth every morning. 30 capsule 1   ondansetron (ZOFRAN ODT) 4 MG disintegrating tablet Take 1 tablet (4 mg total) by mouth every 8 (eight) hours as needed for nausea or vomiting. 20 tablet 5   ondansetron (ZOFRAN ODT) 8 MG disintegrating tablet 8mg  ODT q8  hours prn nausea 10 tablet 0   OVER THE COUNTER MEDICATION Meca root     OVER THE COUNTER MEDICATION Calcium, magnesium, zinc with D3 (one tablet daily)     OVER THE COUNTER MEDICATION Collagen and vitamin c and biotin (takes one daily)     OVER THE COUNTER MEDICATION Tunic for muscle and joint health     polyethylene glycol (MIRALAX / GLYCOLAX) packet Take 17 g by mouth daily as needed (for constipation.).      potassium chloride (KLOR-CON) 10 MEQ tablet Take 1 tablet (10 mEq total) by mouth 3 (three) times daily. 12 tablet 0   PREMARIN vaginal cream Place 1 Applicatorful vaginally 2 (two) times a week.      Prucalopride Succinate (MOTEGRITY) 2 MG TABS Take 1 tablet (2 mg total) by mouth daily. 14 tablet 0   simethicone (MYLICON) 80 MG chewable tablet Chew 80 mg by mouth every 6 (six) hours as needed for flatulence.     topiramate (TOPAMAX) 100 MG tablet Take 1.5 tablets (150 mg total) by mouth daily. 135 tablet 1   triamterene-hydrochlorothiazide (MAXZIDE-25) 37.5-25 MG tablet Take 1 tablet by mouth daily. 90 tablet 1   No facility-administered medications prior to visit.    PAST MEDICAL HISTORY: Past Medical History:  Diagnosis Date   Adie's pupil, left    Alopecia    Anxiety    Autoimmune disease ([place])    "Alopecia"   Bacterial vaginosis    Cyst of spleen    Depression    Disorder of oral soft tissue 03/17/2019   Frequent UTI    Hypertension     Hypertrophy of breast    IIH (idiopathic intracranial hypertension) 2017   "related to headaches"   Injury of jawline 03/17/2019   Migraine headache    denies, ruled out - Propranolol.Using for headaches   Non-suppurative otitis media 03/17/2019    PAST SURGICAL HISTORY: Past Surgical History:  Procedure Laterality Date   ANAL RECTAL MANOMETRY N/A 06/02/2019   Procedure: ANO RECTAL MANOMETRY;  Surgeon: Gatha Mayer, MD;  Location: [facility];  Service: Endoscopy;  Laterality: N/A;   APPENDECTOMY     BREAST EXCISIONAL BIOPSY Right 2017   removed at time of reduction   BREAST REDUCTION SURGERY Bilateral 03/26/2015   Procedure: BILATERAL BREAST REDUCTION  ;  Surgeon: Youlanda Roys, MD;  Location: [facility];  Service: Plastics;  Laterality: Bilateral;   BREATH TEK H PYLORI N/A 05/21/2014   Procedure: BREATH TEK H PYLORI;  Surgeon: Greer Pickerel, MD;  Location: [facility];  Service: General;  Laterality: N/A;   CELIAC PLEXUS BLOCK  07/18/2018   Per patient it was done at [facility] N/A 07/19/2012   Procedure: [place];  Surgeon: Farrel Gobble. Harrington Challenger, MD;  Location: [facility];  Service: Gynecology;  Laterality: N/A;   ENDOMETRIAL ABLATION     ESOPHAGOGASTRODUODENOSCOPY N/A 08/16/2015   Procedure: UPPER ESOPHAGOGASTRODUODENOSCOPY (EGD);  Surgeon: Greer Pickerel, MD;  Location: [facility];  Service: General;  Laterality: N/A;   ESOPHAGOGASTRODUODENOSCOPY N/A 02/21/2016   Procedure: ESOPHAGOGASTRODUODENOSCOPY (EGD);  Surgeon: Greer Pickerel, MD;  Location: [facility];  Service: General;  Laterality: N/A;   ESOPHAGOGASTRODUODENOSCOPY N/A 02/24/2017   Procedure: ESOPHAGOGASTRODUODENOSCOPY (EGD);  Surgeon: Greer Pickerel, MD;  Location: [facility];  Service: General;  Laterality: N/A;   IR GENERIC HISTORICAL  10/04/2015    IR [place] TUBE PERCUT W/FLUORO 10/04/2015 Markus Daft, MD [facility]   IR GENERIC HISTORICAL  01/31/2016   IR [place] TUBE PERCUT W/FLUORO 01/31/2016 Corrie Mckusick, DO [facility]   IR GENERIC HISTORICAL  04/06/2016   IR [place] TUBE Eber Jones W/FLUORO 04/06/2016 Arne Cleveland, MD [facility]   IR GENERIC HISTORICAL  05/14/2016   IR GASTRIC TUBE PERC CHG W/O IMG GUIDE 05/14/2016 Sandi Mariscal, MD [facility]   LAPAROSCOPIC GASTROSTOMY N/A 09/30/2015   Procedure: LAPAROSCOPIC GASTROSTOMY TUBE PLACEMENT;  Surgeon: Greer Pickerel, MD;  Location: [facility];  Service: General;  Laterality: N/A;   LAPAROSCOPIC ROUX-EN-Y GASTRIC BYPASS WITH HIATAL HERNIA REPAIR N/A 07/08/2015   Procedure: LAPAROSCOPIC ROUX-EN-Y GASTRIC BYPASS WITH HIATAL HERNIA REPAIR WITH UPPER ENDOSCOPY;  Surgeon: Greer Pickerel, MD;  Location: [facility];  Service: General;  Laterality: N/A;   LAPAROSCOPIC SMALL BOWEL RESECTION N/A 09/30/2015   Procedure: LAPAROSCOPIC REVISION OF ROUX LIMB;  Surgeon: Greer Pickerel, MD;  Location: [facility];  Service: General;  Laterality: N/A;   LAPAROSCOPY N/A 09/30/2015   Procedure: LAPAROSCOPY DIAGNOSTIC;  Surgeon: Greer Pickerel, MD;  Location: [facility];  Service: General;  Laterality: N/A;   REDUCTION MAMMAPLASTY Bilateral 2017   TUBAL LIGATION     WISDOM TOOTH EXTRACTION      FAMILY HISTORY: Family History  Problem Relation Age of Onset   Cancer Mother        late 46'-50   Breast cancer Mother        late 75'-50   Stroke Father    Colon polyps Father    Diabetes Brother    Seizures Son    Breast cancer Maternal Aunt        late 40'-50   Breast cancer Maternal Aunt        late 40'-50   Breast cancer Maternal Aunt        late 40'-50   Migraines Neg Hx     SOCIAL HISTORY: Social History   Socioeconomic History   Marital status: Divorced    Spouse name: Not on file   Number of children: 2   Years of education: 16   Highest education  level: Not on file  Occupational History   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  Tobacco Use   Smoking status: Never Smoker   Smokeless tobacco: Never Used  Substance and Sexual Activity   Alcohol use: Not Currently   Drug use: No   Sexual activity: Not on file  Other Topics Concern   Not on file  Social History Narrative   Lives with partner   Caffeine use: minimal coffee   Right-handed   Social Determinants of Radio broadcast assistant Strain:    Difficulty of Paying Living Expenses:   Food Insecurity:    Worried About Charity fundraiser in the Last Year:    Arboriculturist in the Last Year:   Transportation Needs:    Film/video editor (Medical):    Lack of Transportation (Non-Medical):   Physical Activity:    Days of Exercise per Week:    Minutes of Exercise per Session:   Stress:    Feeling of Stress :   Social Connections:    Frequency of Communication with Friends and Family:    Frequency of Social Gatherings with Friends and Family:    Attends Religious Services:    Active Member of Clubs or Organizations:    Attends Archivist Meetings:  Marital Status:   Intimate Partner Violence:    Fear of Current or Ex-Partner:    Emotionally Abused:    Physically Abused:    Sexually Abused:       PHYSICAL EXAM  There were no vitals filed for this visit. There is no height or weight on file to calculate BMI.  Generalized: Well developed, in no acute distress  Cardiology: normal rate and rhythm, no murmur noted Respiratory: clear to auscultation bilaterally  Neurological examination  Mentation: Alert oriented to time, place, history taking. Follows all commands speech and language fluent Cranial nerve II-XII: Pupils were equal round reactive to light. Extraocular movements were full, visual field were full on confrontational test. Facial sensation and strength were normal. Uvula tongue midline. Head turning and shoulder  shrug  were normal and symmetric. Motor: The motor testing reveals 5 over 5 strength of all 4 extremities. Good symmetric motor tone is noted throughout.  Sensory: Sensory testing is intact to soft touch on all 4 extremities. No evidence of extinction is noted.  Coordination: Cerebellar testing reveals good finger-nose-finger and heel-to-shin bilaterally.  Gait and station: Gait is normal. Tandem gait is normal. Romberg is negative. No drift is seen.  Reflexes: Deep tendon reflexes are symmetric and normal bilaterally.   DIAGNOSTIC DATA (LABS, IMAGING, TESTING) - I reviewed patient records, labs, notes, testing and imaging myself where available.  No flowsheet data found.   Lab Results  Component Value Date   WBC 10.0 06/30/2019   HGB 13.5 06/30/2019   HCT 40.3 06/30/2019   MCV 91.2 06/30/2019   PLT 326 06/30/2019      Component Value Date/Time   NA 140 07/03/2019 1022   K 3.9 07/03/2019 1022   CL 104 07/03/2019 1022   CO2 21 07/03/2019 1022   GLUCOSE 99 07/03/2019 1022   GLUCOSE 84 06/30/2019 2208   BUN 22 07/03/2019 1022   CREATININE 1.48 (H) 07/03/2019 1022   CREATININE 0.95 11/15/2015 1712   CALCIUM 8.8 07/03/2019 1022   PROT 7.1 06/30/2019 2208   PROT 6.4 02/28/2019 1015   ALBUMIN 3.8 06/30/2019 2208   ALBUMIN 3.8 02/28/2019 1015   AST 26 06/30/2019 2208   ALT 20 06/30/2019 2208   ALKPHOS 103 06/30/2019 2208   BILITOT 0.5 06/30/2019 2208   BILITOT <0.2 02/28/2019 1015   GFRNONAA 41 (L) 07/03/2019 1022   GFRNONAA 73 11/15/2015 1712   GFRAA 48 (L) 07/03/2019 1022   GFRAA 84 11/15/2015 1712   Lab Results  Component Value Date   CHOL 207 (H) 02/28/2019   HDL 60 02/28/2019   LDLCALC 131 (H) 02/28/2019   TRIG 92 02/28/2019   CHOLHDL 3.5 02/28/2019   Lab Results  Component Value Date   HGBA1C 5.3 03/16/2016   Lab Results  Component Value Date   VITAMINB12 1,551 (H) 03/14/2018   Lab Results  Component Value Date   TSH 1.310 02/28/2019        ASSESSMENT AND PLAN 49 y.o. year old female  has a past medical history of Adie's pupil, left, Alopecia, Anxiety, Autoimmune disease ([place]), Bacterial vaginosis, Cyst of spleen, Depression, Disorder of oral soft tissue (03/17/2019), Frequent UTI, Hypertension, Hypertrophy of breast, IIH (idiopathic intracranial hypertension) (2017), Injury of jawline (03/17/2019), Migraine headache, and Non-suppurative otitis media (03/17/2019). here with ***  No diagnosis found.     No orders of the defined types were placed in this encounter.    No orders of the defined types were placed in this encounter.  I spent 15 minutes with the patient. 50% of this time was spent counseling and educating patient on plan of care and medications.    Debbora Presto, FNP-C 07/10/2019, 8:05 AM [facility] [address], [place], [place] [address] [phone]

## 2019-07-11 NOTE — Telephone Encounter (Signed)
No action required.

## 2019-07-12 ENCOUNTER — Ambulatory Visit: Payer: Medicare HMO | Admitting: Family Medicine

## 2019-07-12 NOTE — Progress Notes (Deleted)
PATIENT: Margaret Fletcher DOB: [birth year]  REASON FOR VISIT: follow up HISTORY FROM: patient  No chief complaint on file.    HISTORY OF PRESENT ILLNESS: Today 07/12/19 Margaret Fletcher is a 49 y.o. female here today for follow up for headaches.   HISTORY: (copied from my note on 01/09/2019)  Margaret Fletcher is a 49 y.o. female here today for follow up. She continues topiramate 150mg  daily as well as amitriptyline 10mg  at night for migraine prevention. She reports that headaches are now occurring about 2-3 times a week. She continues to have about 2 migraines a week. She is going through a full prescription of Relpax each month. She has weaned Excedrin and is not taking any other OTC medications for headaches. She reports that eye exam this year was normal. I will request report today. She denies vision changes. She was started on Maxide for BP management and feels that this has helped. She continues Buprenorphine film 300mg  every 12 hours for pain and clonazepam 0.5mg  daily for anxiety. She continues Pristiq for mood. She reports that her PCP left the practice. She has not been able to get refills of her Adderall. She was referred to psychiatry for continued management. She is on Xulane patch for hot flashes and dysmenorrhea. She has an allergy to sulfa and cannot take Diamox.   HISTORY: (copied from my note on 10/06/2018)  Margaret Fletcher a 49 y.o.femalehere today for follow up for migraines. She has not been seen since 02/2017 due to loss of insurance coverage. She has recently been approved for disability.   She has had increased frequency of headaches and migraines. She has history of IIH and Adie's eye. Headaches are daily with migraines occurring 2-3 times a week. She has noted a runny nose with headaches and feels pressure behind her eyes. She increased her dose of topiramate from 75mg  to 100mg  "a while ago." Last RX written from Dr Pamella Pert was for 45 tablets and  2 refills. She reports that she would get refills early. She is also taking amitriptyline 20mg . She is using a full rx of Relpax every month, sometimes in the first three weeks. Last rx written 03/23/2018 for 10 tablets with 2 refills. She very rarely uses Excedrin, only if she has to. She reports recent eye exam was normal with no concerns of optic nerve swelling.   She is s/p bypass surgery. 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 She is taking buprenorphine 346mcg daily. She is taking clonazepam 0.5mg  daily. Pristiq for depression.   She does not feel her BP has been elevated in the past. She notes elevation today but feels it is related to pain. She has noted dizziness and vertiginous symptoms. Dizziness sometimes when standing up. She reports being well hydrated. She reports concerns of hypoglycemia in absence of diagnosis of diabetes. Readings fluctuate between 50-160.   HISTORY: (copied fromMegan Millikan'snote on 02/22/2017)  Today12/17/18]Margaret Fletcher is a 49 year old female with a history of Adie's eyeand migraine headaches. She returns today for follow-up. She states in November she developed a severe headache. She states that it was accompanied by heaviness in the right arm and in the legs. She states that she had trouble holding things in the right hand. She went to the emergency room and diagnosed with a complex migraine. She reports that once her headache was better theother symptoms improved as well. She states that  since her visit to the ED in November she has essentially has had a headache daily. She reports that some days her headaches are worse than others. Had a sleep study in the past that did not show sleep apnea. She is currently on Topamax 50 mg at bedtime and amitriptyline 20 mg at bedtime. She reports that her primary care recently put her on Pristiq for depression.  She continues to use Relpax for her headaches. In the past the patient's topamaxdose was decreased due to worsening fatigue,depression and memory problems. She does feel that since her Topamax dose has been decreased her headaches have gotten worse. She remains on amitriptyline 20 mg at bedtime. She does report that it makes her feel very groggy the next morning. She returns today for an evaluation.  HISTORY7/9/18:Margaret L Hayesis a 49 y.o.femalehere as a referral from Dr. Brain Hilts dilated pupil. She was diagnosed with Adie's eye. She had a headache, some headache, ear congestion and then noticed that her pupil was dilated. Her eye gets sore even now. She has lost 120 pounds. Her headaches are better. It is hard to eat. She was denied for disability. Itis in appeal. She applied for medicaid. She wears sunglasses. She is having blurry vision in the eye. The left eye is blurry. She was evaluated by ophthalmology. Going outside is hard. No improvement. She takes 150mg  Topiramate daily. She is still having headaches every day, she takes eletriptan, She takes the eletriptan when the headaches are migrainous. She has daily headaches that last all day long. She is under a lot of stress, headaches better when it is dark, the headache feels like eye strain.  Reviewed notes, labs and imaging from outside physicians, which showed:   CT headshowed No acute intracranial abnormalities including mass lesion or mass effect, hydrocephalus, extra-axial fluid collection, midline shift, hemorrhage, or acute infarction, large ischemic events (personally reviewed images)   hgba1c 5.3, RPR NR    REVIEW OF SYSTEMS: Out of a complete 14 system review of symptoms, the patient complains only of the following symptoms, and all other reviewed systems are negative.  ALLERGIES: Allergies  Allergen Reactions  . Sulfa Antibiotics Anaphylaxis  . Metoclopramide Other (See Comments)    Elevated prolactin  >5x ULN induction lactation when taking scheduled for several months  . Zosyn [Piperacillin Sod-Tazobactam So] Rash    Has patient had a PCN reaction causing immediate rash, facial/tongue/throat swelling, SOB or lightheadedness with hypotension: No Has patient had a PCN reaction causing severe rash involving mucus membranes or skin necrosis: No Has patient had a PCN reaction that required hospitalization: Unknown--inpatient when reaction occurred Has patient had a PCN reaction occurring within the last 10 years: Yes If all of the above answers are "NO", then may proceed with Cephalosporin use.     HOME MEDICATIONS: Outpatient Medications Prior to Visit  Medication Sig Dispense Refill  . albuterol (PROVENTIL HFA;VENTOLIN HFA) 108 (90 Base) MCG/ACT inhaler Inhale 2 puffs into the lungs every 6 (six) hours as needed for wheezing or shortness of breath. 1 Inhaler 0  . amitriptyline (ELAVIL) 10 MG tablet TAKE 2 TABLETS BY MOUTH AT BEDTIME. (Patient taking differently: Take 20 mg by mouth at bedtime. ) 60 tablet 2  . azelastine (ASTELIN) 0.1 % nasal spray Place 1 spray into both nostrils 2 (two) times daily. Use in each nostril as directed 30 mL 0  . BAYER MICROLET LANCETS lancets Use as instructed 100 each 12  . BELBUCA 450 MCG FILM Take 450 mcg  by mouth daily as needed (breakthrough pain).     . BELBUCA 600 MCG FILM Take 1 strip by mouth 2 (two) times daily.    Marland Kitchen buPROPion (WELLBUTRIN SR) 100 MG 12 hr tablet Take 1 tablet (100 mg total) by mouth daily. 30 tablet 0  . busPIRone (BUSPAR) 5 MG tablet Take 1 tablet (5 mg total) by mouth 3 (three) times daily. 90 tablet 0  . CALCIUM-VITAMIN D PO Take 1 tablet by mouth 2 (two) times daily. chewable    . cetirizine (ZYRTEC) 10 MG tablet Take 1 tablet (10 mg total) by mouth daily. (Patient taking differently: Take 10 mg by mouth daily as needed for allergies. ) 30 tablet 11  . chlorhexidine (PERIDEX) 0.12 % solution Use as directed 15 mLs in the mouth or  throat 2 (two) times daily. 120 mL 0  . Cyanocobalamin (VITAMIN B-12 SL) Place 1 drop under the tongue daily. 1 dropperful    . cyclobenzaprine (FLEXERIL) 5 MG tablet TAKE 1 TABLET BY MOUTH 3 TIMES DAILY AS NEEDED FOR MUSCLE SPASMS. MAY TAKE 2 TABS BY MOUTH AT BEDTIME (Patient taking differently: Take 5-10 mg by mouth See admin instructions. TAKE 5mg  BY MOUTH 3 TIMES DAILY AS NEEDED FOR MUSCLE SPASMS. MAY TAKE 10mg  BY MOUTH AT BEDTIME) 40 tablet 2  . desvenlafaxine (PRISTIQ) 100 MG 24 hr tablet TAKE 1 TABLET BY MOUTH ONCE A DAY (Patient taking differently: Take 100 mg by mouth daily. ) 90 tablet 0  . dicyclomine (BENTYL) 20 MG tablet Take 1 tablet (20 mg total) by mouth 2 (two) times daily. 20 tablet 0  . docusate sodium (COLACE) 100 MG capsule Take 100 mg by mouth daily as needed.     . eletriptan (RELPAX) 40 MG tablet Take 1 tablet (40 mg total) by mouth every 2 (two) hours as needed for migraine or headache. Do not use >2 doses/24 hours 10 tablet 5  . famotidine (PEPCID) 20 MG tablet Take 1 tablet (20 mg total) by mouth 2 (two) times daily. 60 tablet 2  . fluticasone (FLONASE) 50 MCG/ACT nasal spray Place 2 sprays into both nostrils daily. (Patient taking differently: Place 2 sprays into both nostrils daily as needed for allergies. ) 16 g 6  . glucose blood (CONTOUR NEXT TEST) test strip 1 each by Other route 4 (four) times daily as needed (hypoglycemia). K91.2 270 each 5  . hydrocortisone (ANUSOL-HC) 25 MG suppository Place 1 suppository (25 mg total) rectally at bedtime. Use for 5 nights 5 suppository 0  . mometasone (ELOCON) 0.1 % lotion Apply 3 drops topically See admin instructions. 3 drops into affected ear(s) tic for 2-4 days until itching resolves repeat as needed    . Multiple Vitamins-Minerals (BARIATRIC MULTIVITAMINS/IRON PO) Take 1 tablet by mouth 3 (three) times daily.    . naloxegol oxalate (MOVANTIK) 25 MG TABS tablet Take 1 tablet (25 mg total) by mouth daily. 30 tablet 1  .  nitrofurantoin, macrocrystal-monohydrate, (MACROBID) 100 MG capsule Take 1 capsule (100 mg total) by mouth 2 (two) times daily. 20 capsule 0  . Omega-3 Fatty Acids (OMEGA-3 FISH OIL PO) Take by mouth daily.    Marland Kitchen omeprazole (PRILOSEC) 20 MG capsule Take 1 capsule (20 mg total) by mouth every morning. 30 capsule 1  . ondansetron (ZOFRAN ODT) 4 MG disintegrating tablet Take 1 tablet (4 mg total) by mouth every 8 (eight) hours as needed for nausea or vomiting. 20 tablet 5  . ondansetron (ZOFRAN ODT) 8 MG disintegrating tablet  8mg  ODT q8 hours prn nausea 10 tablet 0  . OVER THE COUNTER MEDICATION Meca root    . OVER THE COUNTER MEDICATION Calcium, magnesium, zinc with D3 (one tablet daily)    . OVER THE COUNTER MEDICATION Collagen and vitamin c and biotin (takes one daily)    . OVER THE COUNTER MEDICATION Tunic for muscle and joint health    . polyethylene glycol (MIRALAX / GLYCOLAX) packet Take 17 g by mouth daily as needed (for constipation.).     Marland Kitchen potassium chloride (KLOR-CON) 10 MEQ tablet Take 1 tablet (10 mEq total) by mouth 3 (three) times daily. 12 tablet 0  . PREMARIN vaginal cream Place 1 Applicatorful vaginally 2 (two) times a week.     . Prucalopride Succinate (MOTEGRITY) 2 MG TABS Take 1 tablet (2 mg total) by mouth daily. 14 tablet 0  . simethicone (MYLICON) 80 MG chewable tablet Chew 80 mg by mouth every 6 (six) hours as needed for flatulence.    . topiramate (TOPAMAX) 100 MG tablet Take 1.5 tablets (150 mg total) by mouth daily. 135 tablet 1  . triamterene-hydrochlorothiazide (MAXZIDE-25) 37.5-25 MG tablet Take 1 tablet by mouth daily. 90 tablet 1   No facility-administered medications prior to visit.    PAST MEDICAL HISTORY: Past Medical History:  Diagnosis Date  . Adie's pupil, left   . Alopecia   . Anxiety   . Autoimmune disease ([place])    "Alopecia"  . Bacterial vaginosis   . Cyst of spleen   . Depression   . Disorder of oral soft tissue 03/17/2019  . Frequent UTI   .  Hypertension   . Hypertrophy of breast   . IIH (idiopathic intracranial hypertension) 2017   "related to headaches"  . Injury of jawline 03/17/2019  . Migraine headache    denies, ruled out - Propranolol.Using for headaches  . Non-suppurative otitis media 03/17/2019    PAST SURGICAL HISTORY: Past Surgical History:  Procedure Laterality Date  . ANAL RECTAL MANOMETRY N/A 06/02/2019   Procedure: ANO RECTAL MANOMETRY;  Surgeon: Gatha Mayer, MD;  Location: [facility];  Service: Endoscopy;  Laterality: N/A;  . APPENDECTOMY    . BREAST EXCISIONAL BIOPSY Right 2017   removed at time of reduction  . BREAST REDUCTION SURGERY Bilateral 03/26/2015   Procedure: BILATERAL BREAST REDUCTION  ;  Surgeon: Youlanda Roys, MD;  Location: [place];  Service: Plastics;  Laterality: Bilateral;  . BREATH TEK H PYLORI N/A 05/21/2014   Procedure: BREATH TEK H PYLORI;  Surgeon: Greer Pickerel, MD;  Location: [facility];  Service: General;  Laterality: N/A;  . CELIAC PLEXUS BLOCK  07/18/2018   Per patient it was done at [facility]    . CESAREAN SECTION    . CHOLECYSTECTOMY    . DILITATION & CURRETTAGE/HYSTROSCOPY WITH NOVASURE ABLATION N/A 07/19/2012   Procedure: HYSTEROSCOPY WITH NOVASURE ABLATION;  Surgeon: Farrel Gobble. Harrington Challenger, MD;  Location: [facility];  Service: Gynecology;  Laterality: N/A;  . ENDOMETRIAL ABLATION    . ESOPHAGOGASTRODUODENOSCOPY N/A 08/16/2015   Procedure: UPPER ESOPHAGOGASTRODUODENOSCOPY (EGD);  Surgeon: Greer Pickerel, MD;  Location: [facility];  Service: General;  Laterality: N/A;  . ESOPHAGOGASTRODUODENOSCOPY N/A 02/21/2016   Procedure: ESOPHAGOGASTRODUODENOSCOPY (EGD);  Surgeon: Greer Pickerel, MD;  Location: [facility];  Service: General;  Laterality: N/A;  . ESOPHAGOGASTRODUODENOSCOPY N/A 02/24/2017   Procedure: ESOPHAGOGASTRODUODENOSCOPY (EGD);  Surgeon: Greer Pickerel, MD;  Location: [facility];  Service: General;  Laterality: N/A;  .  IR GENERIC  HISTORICAL  10/04/2015   IR [place] TUBE PERCUT W/FLUORO 10/04/2015 Markus Daft, MD [facility]  . IR GENERIC HISTORICAL  01/31/2016   IR [place] GASTRO/COLONIC TUBE PERCUT W/FLUORO 01/31/2016 Corrie Mckusick, DO [facility]  . IR GENERIC HISTORICAL  04/06/2016   IR [place] TUBE PERCUT W/FLUORO 04/06/2016 Arne Cleveland, MD [facility]  . IR GENERIC HISTORICAL  05/14/2016   IR GASTRIC TUBE PERC CHG W/O IMG GUIDE 05/14/2016 Sandi Mariscal, MD [facility]  . LAPAROSCOPIC GASTROSTOMY N/A 09/30/2015   Procedure: LAPAROSCOPIC GASTROSTOMY TUBE PLACEMENT;  Surgeon: Greer Pickerel, MD;  Location: [facility];  Service: General;  Laterality: N/A;  . LAPAROSCOPIC ROUX-EN-Y GASTRIC BYPASS WITH HIATAL HERNIA REPAIR N/A 07/08/2015   Procedure: LAPAROSCOPIC ROUX-EN-Y GASTRIC BYPASS WITH HIATAL HERNIA REPAIR WITH UPPER ENDOSCOPY;  Surgeon: Greer Pickerel, MD;  Location: [facility];  Service: General;  Laterality: N/A;  . LAPAROSCOPIC SMALL BOWEL RESECTION N/A 09/30/2015   Procedure: LAPAROSCOPIC REVISION OF ROUX LIMB;  Surgeon: Greer Pickerel, MD;  Location: [facility];  Service: General;  Laterality: N/A;  . LAPAROSCOPY N/A 09/30/2015   Procedure: LAPAROSCOPY DIAGNOSTIC;  Surgeon: Greer Pickerel, MD;  Location: [facility];  Service: General;  Laterality: N/A;  . REDUCTION MAMMAPLASTY Bilateral 2017  . TUBAL LIGATION    . WISDOM TOOTH EXTRACTION      FAMILY HISTORY: Family History  Problem Relation Age of Onset  . Cancer Mother        late 78'-50  . Breast cancer Mother        late 33'-50  . Stroke Father   . Colon polyps Father   . Diabetes Brother   . Seizures Son   . Breast cancer Maternal Aunt        late 40'-50  . Breast cancer Maternal Aunt        late 40'-50  . Breast cancer Maternal Aunt        late 40'-50  . Migraines Neg Hx     SOCIAL HISTORY: Social History   Socioeconomic History  . Marital status: Divorced    Spouse name: Not on file  . Number of children: 2  . Years of education: 6  .  Highest education level: Not on file  Occupational History  . 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 Smoking status: Never Smoker  . Smokeless tobacco: Never Used  Substance and Sexual Activity  . Alcohol use: Not Currently  . Drug use: No  . Sexual activity: Not on file  Other Topics Concern  . Not on file  Social History Narrative   Lives with partner   Caffeine use: minimal coffee   Right-handed   Social Determinants of Health   Financial Resource Strain:   . Difficulty of Paying Living Expenses:   Food Insecurity:   . Worried About Charity fundraiser in the Last Year:   . Arboriculturist in the Last Year:   Transportation Needs:   . Film/video editor (Medical):   Marland Kitchen Lack of Transportation (Non-Medical):   Physical Activity:   . Days of Exercise per Week:   . Minutes of Exercise per Session:   Stress:   . Feeling of Stress :   Social Connections:   . Frequency of Communication with Friends and Family:   . Frequency of Social Gatherings with Friends and Family:   . Attends Religious Services:   . Active Member of Clubs or Organizations:   . Attends  Club or Organization Meetings:   Marland Kitchen Marital Status:   Intimate Partner Violence:   . Fear of Current or Ex-Partner:   . Emotionally Abused:   Marland Kitchen Physically Abused:   . Sexually Abused:       PHYSICAL EXAM  There were no vitals filed for this visit. There is no height or weight on file to calculate BMI.  Generalized: Well developed, in no acute distress  Cardiology: normal rate and rhythm, no murmur noted Respiratory: clear to auscultation bilaterally  Neurological examination  Mentation: Alert oriented to time, place, history taking. Follows all commands speech and language fluent Cranial nerve II-XII: Pupils were equal round reactive to light. Extraocular movements were full, visual field were full on confrontational test. Facial sensation and strength were normal. Uvula tongue midline. Head  turning and shoulder shrug  were normal and symmetric. Motor: The motor testing reveals 5 over 5 strength of all 4 extremities. Good symmetric motor tone is noted throughout.  Sensory: Sensory testing is intact to soft touch on all 4 extremities. No evidence of extinction is noted.  Coordination: Cerebellar testing reveals good finger-nose-finger and heel-to-shin bilaterally.  Gait and station: Gait is normal. Tandem gait is normal. Romberg is negative. No drift is seen.  Reflexes: Deep tendon reflexes are symmetric and normal bilaterally.   DIAGNOSTIC DATA (LABS, IMAGING, TESTING) - I reviewed patient records, labs, notes, testing and imaging myself where available.  No flowsheet data found.   Lab Results  Component Value Date   WBC 10.0 06/30/2019   HGB 13.5 06/30/2019   HCT 40.3 06/30/2019   MCV 91.2 06/30/2019   PLT 326 06/30/2019      Component Value Date/Time   NA 140 07/03/2019 1022   K 3.9 07/03/2019 1022   CL 104 07/03/2019 1022   CO2 21 07/03/2019 1022   GLUCOSE 99 07/03/2019 1022   GLUCOSE 84 06/30/2019 2208   BUN 22 07/03/2019 1022   CREATININE 1.48 (H) 07/03/2019 1022   CREATININE 0.95 11/15/2015 1712   CALCIUM 8.8 07/03/2019 1022   PROT 7.1 06/30/2019 2208   PROT 6.4 02/28/2019 1015   ALBUMIN 3.8 06/30/2019 2208   ALBUMIN 3.8 02/28/2019 1015   AST 26 06/30/2019 2208   ALT 20 06/30/2019 2208   ALKPHOS 103 06/30/2019 2208   BILITOT 0.5 06/30/2019 2208   BILITOT <0.2 02/28/2019 1015   GFRNONAA 41 (L) 07/03/2019 1022   GFRNONAA 73 11/15/2015 1712   GFRAA 48 (L) 07/03/2019 1022   GFRAA 84 11/15/2015 1712   Lab Results  Component Value Date   CHOL 207 (H) 02/28/2019   HDL 60 02/28/2019   LDLCALC 131 (H) 02/28/2019   TRIG 92 02/28/2019   CHOLHDL 3.5 02/28/2019   Lab Results  Component Value Date   HGBA1C 5.3 03/16/2016   Lab Results  Component Value Date   VITAMINB12 1,551 (H) 03/14/2018   Lab Results  Component Value Date   TSH 1.310 02/28/2019         ASSESSMENT AND PLAN 49 y.o. year old female  has a past medical history of Adie's pupil, left, Alopecia, Anxiety, Autoimmune disease ([place]), Bacterial vaginosis, Cyst of spleen, Depression, Disorder of oral soft tissue (03/17/2019), Frequent UTI, Hypertension, Hypertrophy of breast, IIH (idiopathic intracranial hypertension) (2017), Injury of jawline (03/17/2019), Migraine headache, and Non-suppurative otitis media (03/17/2019). here with ***  No diagnosis found.     No orders of the defined types were placed in this encounter.    No orders of the  defined types were placed in this encounter.     I spent 15 minutes with the patient. 50% of this time was spent counseling and educating patient on plan of care and medications.    Debbora Presto, FNP-C 07/12/2019, 7:43 AM [facility] [address] [place], [place] [address] [phone]

## 2019-07-24 DIAGNOSIS — M7918 Myalgia, other site: Secondary | ICD-10-CM | POA: Diagnosis not present

## 2019-07-24 DIAGNOSIS — G8929 Other chronic pain: Secondary | ICD-10-CM | POA: Diagnosis not present

## 2019-07-24 DIAGNOSIS — R1013 Epigastric pain: Secondary | ICD-10-CM | POA: Diagnosis not present

## 2019-07-24 MED FILL — BELBUCA 600 MCG FILM: 600 | 30 days supply | Qty: 60 | Fill #0

## 2019-07-24 MED FILL — CYCLOBENZAPRINE HCL 5 MG TA: 5 | 30 days supply | Qty: 30 | Fill #0

## 2019-07-25 ENCOUNTER — Other Ambulatory Visit: Payer: Self-pay | Admitting: Neurology

## 2019-07-25 ENCOUNTER — Other Ambulatory Visit: Payer: Self-pay | Admitting: Family Medicine

## 2019-07-25 ENCOUNTER — Other Ambulatory Visit (HOSPITAL_COMMUNITY): Payer: Self-pay | Admitting: Psychiatry

## 2019-07-25 MED FILL — TRIAMTERENE-HCTZ 37.5-25 MG: 37.5-25 | 90 days supply | Qty: 90 | Fill #0

## 2019-07-25 MED FILL — TOPIRAMATE 100 MG TABLET: 100 | 20 days supply | Qty: 30 | Fill #2

## 2019-07-25 MED FILL — ONDANSETRON ODT 8 MG TABLET: 8 | 6 days supply | Qty: 20 | Fill #3

## 2019-07-25 MED FILL — AMITRIPTYLINE HCL 10 MG TAB: 10 | 30 days supply | Qty: 60 | Fill #0

## 2019-07-25 MED FILL — busPIRone HCL 5 MG TABS: 5 | 30 days supply | Qty: 90 | Fill #0

## 2019-07-31 ENCOUNTER — Telehealth (INDEPENDENT_AMBULATORY_CARE_PROVIDER_SITE_OTHER): Payer: Medicare HMO | Admitting: Psychiatry

## 2019-07-31 ENCOUNTER — Encounter (HOSPITAL_COMMUNITY): Payer: Self-pay | Admitting: Psychiatry

## 2019-07-31 DIAGNOSIS — F411 Generalized anxiety disorder: Secondary | ICD-10-CM | POA: Diagnosis not present

## 2019-07-31 DIAGNOSIS — R5382 Chronic fatigue, unspecified: Secondary | ICD-10-CM

## 2019-07-31 DIAGNOSIS — F331 Major depressive disorder, recurrent, moderate: Secondary | ICD-10-CM

## 2019-07-31 MED ORDER — BUPROPION HCL ER (SR) 150 MG PO TB12: 150.0000 mg | ORAL_TABLET | Freq: Every day | ORAL | 1 refills | Status: DC

## 2019-07-31 MED FILL — BUPROPION HCL SR 150 MG TAB: 150 | 30 days supply | Qty: 30 | Fill #0
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
## 2019-08-01 ENCOUNTER — Other Ambulatory Visit: Payer: Self-pay

## 2019-08-01 ENCOUNTER — Encounter: Payer: Self-pay | Admitting: Physical Therapy

## 2019-08-01 ENCOUNTER — Ambulatory Visit: Payer: Medicare HMO | Attending: Internal Medicine | Admitting: Physical Therapy

## 2019-08-01 DIAGNOSIS — M6281 Muscle weakness (generalized): Secondary | ICD-10-CM | POA: Diagnosis not present

## 2019-08-01 DIAGNOSIS — R279 Unspecified lack of coordination: Secondary | ICD-10-CM

## 2019-08-01 DIAGNOSIS — R252 Cramp and spasm: Secondary | ICD-10-CM | POA: Diagnosis not present

## 2019-08-01 DIAGNOSIS — R293 Abnormal posture: Secondary | ICD-10-CM

## 2019-08-01 DIAGNOSIS — M542 Cervicalgia: Secondary | ICD-10-CM

## 2019-08-01 NOTE — Therapy (Signed)
[facility] [address] [place], [place], [address] Phone: [phone]   Fax:  [phone]  Physical Therapy Treatment  Patient Details  Name: Margaret Fletcher MRN: [id number] Date of Birth: 1970/08/23 Referring Provider (PT): Gatha Mayer, MD   Encounter Date: 08/01/2019  PT End of Session - 08/01/19 0809    Visit Number  8    Number of Visits  13    Date for PT Re-Evaluation  08/07/19    Authorization Type  [employer]    PT Start Time  0805    PT Stop Time  0843    PT Time Calculation (min)  38 min    Activity Tolerance  Patient tolerated treatment well    Behavior During Therapy  [facility] for tasks assessed/performed       Past Medical History:  Diagnosis Date  . Adie's pupil, left   . Alopecia   . Anxiety   . Autoimmune disease ([place])    "Alopecia"  . Bacterial vaginosis   . Cyst of spleen   . Depression   . Disorder of oral soft tissue 03/17/2019  . Frequent UTI   . Hypertension   . Hypertrophy of breast   . IIH (idiopathic intracranial hypertension) 2017   "related to headaches"  . Injury of jawline 03/17/2019  . Migraine headache    denies, ruled out - Propranolol.Using for headaches  . Non-suppurative otitis media 03/17/2019    Past Surgical History:  Procedure Laterality Date  . ANAL RECTAL MANOMETRY N/A 06/02/2019   Procedure: ANO RECTAL MANOMETRY;  Surgeon: Gatha Mayer, MD;  [facility];  Service: Endoscopy;  Laterality: N/A;  . APPENDECTOMY    . BREAST EXCISIONAL BIOPSY Right 2017   removed at time of reduction  . BREAST REDUCTION SURGERY Bilateral 03/26/2015   Procedure: BILATERAL BREAST REDUCTION  ;  Surgeon: Youlanda Roys, MD;  Location: [place];  Service: Plastics;  Laterality: Bilateral;  . BREATH TEK H PYLORI N/A 05/21/2014   Procedure: BREATH TEK H PYLORI;  Surgeon: Greer Pickerel, MD;  Location[facility];  Service: General;  Laterality:  N/A;  . CELIAC PLEXUS BLOCK  07/18/2018   Per patient it was done at [place]    . CESAREAN SECTION    . CHOLECYSTECTOMY    . DILITATION & CURRETTAGE/HYSTROSCOPY WITH NOVASURE ABLATION N/A 07/19/2012   Procedure: HYSTEROSCOPY WITH NOVASURE ABLATION;  Surgeon: Farrel Gobble. Harrington Challenger, MD;  Location: [place] [facility];  Service: Gynecology;  Laterality: N/A;  . ENDOMETRIAL ABLATION    . ESOPHAGOGASTRODUODENOSCOPY N/A 08/16/2015   Procedure: UPPER ESOPHAGOGASTRODUODENOSCOPY (EGD);  Surgeon: Greer Pickerel, MD;  Location: [facility];  Service: General;  Laterality: N/A;  . ESOPHAGOGASTRODUODENOSCOPY N/A 02/21/2016   Procedure: ESOPHAGOGASTRODUODENOSCOPY (EGD);  Surgeon: Greer Pickerel, MD;  Location: [facility];  Service: General;  Laterality: N/A;  . ESOPHAGOGASTRODUODENOSCOPY N/A 02/24/2017   Procedure: ESOPHAGOGASTRODUODENOSCOPY (EGD);  Surgeon: Greer Pickerel, MD;  [facility];  Service: General;  Laterality: N/A;  . IR GENERIC HISTORICAL  10/04/2015   IR [place] Vivianne Master 10/04/2015 Markus Daft, MD [facility]  . IR GENERIC HISTORICAL  01/31/2016   IR [place] GASTRO/COLONIC TUBE PERCUT W/FLUORO 01/31/2016 Corrie Mckusick, DO [facility]  . IR GENERIC HISTORICAL  04/06/2016   IR [place] TUBE PERCUT W/FLUORO 04/06/2016 Arne Cleveland, MD [facility]  . IR GENERIC HISTORICAL  05/14/2016   IR GASTRIC TUBE PERC CHG W/O IMG  GUIDE 05/14/2016 Sandi Mariscal, MD [facility]  . LAPAROSCOPIC GASTROSTOMY N/A 09/30/2015   Procedure: LAPAROSCOPIC GASTROSTOMY TUBE PLACEMENT;  Surgeon: Greer Pickerel, MD;  [facility];  Service: General;  Laterality: N/A;  . LAPAROSCOPIC ROUX-EN-Y GASTRIC BYPASS WITH HIATAL HERNIA REPAIR N/A 07/08/2015   Procedure: LAPAROSCOPIC ROUX-EN-Y GASTRIC BYPASS WITH HIATAL HERNIA REPAIR WITH UPPER ENDOSCOPY;  Surgeon: Greer Pickerel, MD;  [facility];  Service: General;  Laterality: N/A;  . LAPAROSCOPIC SMALL BOWEL RESECTION N/A 09/30/2015    Procedure: LAPAROSCOPIC REVISION OF ROUX LIMB;  Surgeon: Greer Pickerel, MD;  [facility];  Service: General;  Laterality: N/A;  . LAPAROSCOPY N/A 09/30/2015   Procedure: LAPAROSCOPY DIAGNOSTIC;  Surgeon: Greer Pickerel, MD;  [facility];  Service: General;  Laterality: N/A;  . REDUCTION MAMMAPLASTY Bilateral 2017  . TUBAL LIGATION    . WISDOM TOOTH EXTRACTION      There were no vitals filed for this visit.  Subjective Assessment - 08/01/19 0807    Subjective  I have been using the enema every time.  It is about the same.  I just use a little to get things going.  I feel like more than 50% improved.    Patient Stated Goals  decreased pain overall, able to have a BM    Currently in Pain?  Yes    Pain Score  6     Pain Location  Abdomen    Pain Orientation  Lower    Pain Descriptors / Indicators  Aching    Pain Type  Chronic pain    Pain Onset  More than a month ago    Pain Frequency  Constant    Multiple Pain Sites  Yes    Pain Score  7    Pain Location  Neck    Pain Orientation  Right    Pain Descriptors / Indicators  Aching    Pain Type  Chronic pain                        [facility] Adult PT Treatment/Exercise - 08/01/19 0001      Neuro Re-ed    Neuro Re-ed Details   all exercises with breathing and down training      Lumbar Exercises: Stretches   Active Hamstring Stretch  Right;Left;1 rep;60 seconds    Double Knee to Chest Stretch  3 reps;30 seconds      Lumbar Exercises: Seated   Other Seated Lumbar Exercises  on pball circles with breathing      Lumbar Exercises: Supine   Large Ball Abdominal Isometric Limitations  pressing on green ball; overhead with green ball    Other Supine Lumbar Exercises  bent knee drop out - 10x             PT Education - 08/01/19 0907    Education Details  [id number] added TrA activation in supine    Person(s) Educated  Patient    Methods  Explanation;Demonstration;Verbal cues;Handout    Comprehension  Verbalized  understanding;Returned demonstration       PT Short Term Goals - 06/20/19 0935      PT SHORT TERM GOAL #1   Title  Pt will be Ind c an initial HEP and toileting techniques    Baseline  used toileting techniques successfully today    Status  Achieved        PT Long Term Goals - 08/01/19 0827      PT LONG TERM GOAL #  1   Title  Pt will be Ind c a final HEP to improve bowel function    Status  On-going      PT LONG TERM GOAL #2   Title  Pt will demonstrate understanding of postures and activities which have positive and negative affects on her pain.    Baseline  pain may be related to digestive issues    Status  Not Met      PT LONG TERM GOAL #3   Title  Pt will report a pain range of 0-3/10 with functional activities such as eating and BM    Baseline  still can be up to 10/10    Status  On-going      PT LONG TERM GOAL #4   Title  Pt will demonstrate pelvic floor strength of 3/5 and able to hold for at least 15 seconds for improved intra-abdominal pressure during activities such as walking    Status  On-going      PT LONG TERM GOAL #5   Title  Pt will be able to have BM and empty bladder with minimal to no bearing down for reduced stress on her GI tract.    Baseline  reports she is bearing down even more    Status  On-going            Plan - 08/01/19 3419    Clinical Impression Statement  Pt reports she is more than 50% improved.  Pt was saying that she is bearing down more when using less enema so she was educated on using more enema as needed to avoid excessive bearing down.  Pt was educated in more downtraining techniques today to assist with improved BM as well as core strengthening    PT Treatment/Interventions  ADLs/Self Care Home Management;Electrical Stimulation;Iontophoresis 69m/ml Dexamethasone;Traction;Moist Heat;Ultrasound;Gait training;Functional mobility training;Balance training;Therapeutic exercise;Therapeutic activities;Patient/family education;Manual  techniques;Passive range of motion;Dry needling;Taping;Energy conservation;Biofeedback;Cryotherapy;Neuromuscular re-education    PT Next Visit Plan  TrA activation; core strength progression, down training    PT Home Exercise Plan  [id number]; added [id number] for pelvic floor    Consulted and Agree with Plan of Care  Patient       Patient will benefit from skilled therapeutic intervention in order to improve the following deficits and impairments:  Improper body mechanics, Pain, Decreased mobility, Decreased activity tolerance, Decreased range of motion, Impaired UE functional use, Decreased balance, Decreased coordination, Increased fascial restricitons, Impaired tone, Decreased strength  Visit Diagnosis: Unspecified lack of coordination  Muscle weakness (generalized)  Cramp and spasm  Cervicalgia  Abnormal posture     Problem List Patient Active Problem List   Diagnosis Date Noted  . Urinary frequency 06/20/2019  . Urinary urgency 06/20/2019  . Weakness of pelvic floor 06/20/2019  . Constipation due to outlet dysfunction   . Essential hypertension, benign 05/30/2019  . 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 History of fall 04/10/2019  . Spondylosis, cervical, with myelopathy 03/17/2019  . 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 Low back pain 03/17/2019  . Non-suppurative otitis media 03/17/2019  . Injury of jawline 03/17/2019  . Disorder of oral soft tissue 03/17/2019  . Reactive depression 06/13/2017  . Chronic pain syndrome 06/13/2017  . Polypharmacy 06/13/2017  . Empty sella syndrome ([place] 05/01/2017  . Adjustment disorder with mixed anxiety and depressed mood 10/13/2016  . 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 Hypokalemia 02/22/2016  . Constipation, chronic 02/22/2016  . Memory loss due to medical condition  12/14/2015  . Chronic fatigue 12/14/2015  . Recurrent dehydration 09/30/2015  . Chronic nausea 08/23/2015  . Abdominal  pain, chronic, epigastric 08/20/2015  . Protein-calorie malnutrition, moderate ([place]) 08/19/2015  . Mild obstructive sleep apnea 07/08/2015  . Dyslipidemia 07/08/2015  . S/P gastric bypass 07/08/2015  . IIH (idiopathic intracranial hypertension) 06/03/2015  . Vision changes 04/30/2015  . Perceived hearing changes 04/30/2015  . Chronic daily headache 04/21/2014  . Liver lesion 03/21/2013  . Splenic cyst 03/21/2013    Jule Ser, PT 08/01/2019, 10:23 AM   [facility] [address], [place], [place], [address] Phone: [phone]   Fax:  [phone]  Name: Avry Monteleone MRN: [id number] Date of Birth: 28-Oct-1970

## 2019-08-03 ENCOUNTER — Ambulatory Visit: Payer: Medicare HMO | Admitting: Physical Therapy

## 2019-08-03 ENCOUNTER — Other Ambulatory Visit: Payer: Self-pay

## 2019-08-03 ENCOUNTER — Encounter: Payer: Self-pay | Admitting: Physical Therapy

## 2019-08-03 DIAGNOSIS — M6281 Muscle weakness (generalized): Secondary | ICD-10-CM

## 2019-08-03 DIAGNOSIS — R279 Unspecified lack of coordination: Secondary | ICD-10-CM

## 2019-08-03 DIAGNOSIS — R293 Abnormal posture: Secondary | ICD-10-CM | POA: Diagnosis not present

## 2019-08-03 DIAGNOSIS — R252 Cramp and spasm: Secondary | ICD-10-CM

## 2019-08-03 DIAGNOSIS — M542 Cervicalgia: Secondary | ICD-10-CM | POA: Diagnosis not present

## 2019-08-03 NOTE — Therapy (Signed)
[facility] [address] [place], [place], [address] Phone: [phone]   Fax:  [phone]  Physical Therapy Treatment  Patient Details  Name: [place] MRN: [id number] Date of Birth: [birth year] Referring Provider (PT): Gatha Mayer, MD   Encounter Date: 08/03/2019  PT End of Session - 08/03/19 0841    Visit Number  9    Number of Visits  13    Date for PT Re-Evaluation  08/07/19    Authorization Type  [employer]    PT Start Time  [phone]   arrived late   PT Stop Time  0844    PT Time Calculation (min)  32 min    Activity Tolerance  Patient tolerated treatment well    Behavior During Therapy  [facility] for tasks assessed/performed       Past Medical History:  Diagnosis Date  . Adie's pupil, left   . Alopecia   . Anxiety   . Autoimmune disease ([place])    "Alopecia"  . Bacterial vaginosis   . Cyst of spleen   . Depression   . Disorder of oral soft tissue 03/17/2019  . Frequent UTI   . Hypertension   . Hypertrophy of breast   . IIH (idiopathic intracranial hypertension) 2017   "related to headaches"  . Injury of jawline 03/17/2019  . Migraine headache    denies, ruled out - Propranolol.Using for headaches  . Non-suppurative otitis media 03/17/2019    Past Surgical History:  Procedure Laterality Date  . ANAL RECTAL MANOMETRY N/A 06/02/2019   Procedure: ANO RECTAL MANOMETRY;  Surgeon: Gatha Mayer, MD;  [facility];  Service: Endoscopy;  Laterality: N/A;  . APPENDECTOMY    . BREAST EXCISIONAL BIOPSY Right 2017   removed at time of reduction  . BREAST REDUCTION SURGERY Bilateral 03/26/2015   Procedure: BILATERAL BREAST REDUCTION  ;  Surgeon: Youlanda Roys, MD;  Location: [place];  Service: Plastics;  Laterality: Bilateral;  . BREATH TEK H PYLORI N/A 05/21/2014   Procedure: BREATH TEK H PYLORI;  Surgeon: Greer Pickerel, MD;  [facility]: [facility];  Service: General;   Laterality: N/A;  . CELIAC PLEXUS BLOCK  07/18/2018   Per patient it was done at [place]    . CESAREAN SECTION    . CHOLECYSTECTOMY    . DILITATION & CURRETTAGE/HYSTROSCOPY WITH NOVASURE ABLATION N/A 07/19/2012   Procedure: HYSTEROSCOPY WITH NOVASURE ABLATION;  Surgeon: Farrel Gobble. Harrington Challenger, MD;  Location: [facility];  Service: Gynecology;  Laterality: N/A;  . ENDOMETRIAL ABLATION    . ESOPHAGOGASTRODUODENOSCOPY N/A 08/16/2015   Procedure: UPPER ESOPHAGOGASTRODUODENOSCOPY (EGD);  Surgeon: Greer Pickerel, MD;  Location: [facility];  Service: General;  Laterality: N/A;  . ESOPHAGOGASTRODUODENOSCOPY N/A 02/21/2016   Procedure: ESOPHAGOGASTRODUODENOSCOPY (EGD);  Surgeon: Greer Pickerel, MD;  Location: [facility];  Service: General;  Laterality: N/A;  . ESOPHAGOGASTRODUODENOSCOPY N/A 02/24/2017   Procedure: ESOPHAGOGASTRODUODENOSCOPY (EGD);  Surgeon: Greer Pickerel, MD;  [facility];  Service: General;  Laterality: N/A;  . IR GENERIC HISTORICAL  10/04/2015   IR [place] Vivianne Master 10/04/2015 Markus Daft, MD [facility]  . IR GENERIC HISTORICAL  01/31/2016   IR [place] GASTRO/COLONIC TUBE PERCUT W/FLUORO 01/31/2016 Corrie Mckusick, DO [facility]  . IR GENERIC HISTORICAL  04/06/2016   IR [place] TUBE PERCUT W/FLUORO 04/06/2016 Arne Cleveland, MD [facility]  . IR GENERIC HISTORICAL  05/14/2016   IR GASTRIC TUBE PERC  CHG W/O IMG GUIDE 05/14/2016 Sandi Mariscal, MD [facility]  . LAPAROSCOPIC GASTROSTOMY N/A 09/30/2015   Procedure: LAPAROSCOPIC GASTROSTOMY TUBE PLACEMENT;  Surgeon: Greer Pickerel, MD;  [facility];  Service: General;  Laterality: N/A;  . LAPAROSCOPIC ROUX-EN-Y GASTRIC BYPASS WITH HIATAL HERNIA REPAIR N/A 07/08/2015   Procedure: LAPAROSCOPIC ROUX-EN-Y GASTRIC BYPASS WITH HIATAL HERNIA REPAIR WITH UPPER ENDOSCOPY;  Surgeon: Greer Pickerel, MD;  [facility];  Service: General;  Laterality: N/A;  . LAPAROSCOPIC SMALL BOWEL RESECTION N/A  09/30/2015   Procedure: LAPAROSCOPIC REVISION OF ROUX LIMB;  Surgeon: Greer Pickerel, MD;  [facility];  Service: General;  Laterality: N/A;  . LAPAROSCOPY N/A 09/30/2015   Procedure: LAPAROSCOPY DIAGNOSTIC;  Surgeon: Greer Pickerel, MD;  [facility];  Service: General;  Laterality: N/A;  . REDUCTION MAMMAPLASTY Bilateral 2017  . TUBAL LIGATION    . WISDOM TOOTH EXTRACTION      There were no vitals filed for this visit.  Subjective Assessment - 08/03/19 0828    Subjective  Pt states she was able to have a small BM on her own this morning    Patient Stated Goals  decreased pain overall, able to have a BM                        [facility] Adult PT Treatment/Exercise - 08/03/19 0001      Neuro Re-ed    Neuro Re-ed Details   biofeedback down training; resting tone intially 82m; breathing techniques used with stretches to reduce resting tone      Lumbar Exercises: Stretches   Double Knee to Chest Stretch  3 reps;30 seconds    Piriformis Stretch  Right;Left;3 reps      Lumbar Exercises: Supine   Other Supine Lumbar Exercises  hip IR/ER with LE in extension      Manual Therapy   Manual therapy comments  supine hip rotation rocking 2 min each side               PT Short Term Goals - 06/20/19 0935      PT SHORT TERM GOAL #1   Title  Pt will be Ind c an initial HEP and toileting techniques    Baseline  used toileting techniques successfully today    Status  Achieved        PT Long Term Goals - 08/01/19 0827      PT LONG TERM GOAL #1   Title  Pt will be Ind c a final HEP to improve bowel function    Status  On-going      PT LONG TERM GOAL #2   Title  Pt will demonstrate understanding of postures and activities which have positive and negative affects on her pain.    Baseline  pain may be related to digestive issues    Status  Not Met      PT LONG TERM GOAL #3   Title  Pt will report a pain range of 0-3/10 with functional activities such as eating and  BM    Baseline  still can be up to 10/10    Status  On-going      PT LONG TERM GOAL #4   Title  Pt will demonstrate pelvic floor strength of 3/5 and able to hold for at least 15 seconds for improved intra-abdominal pressure during activities such as walking    Status  On-going      PT LONG TERM GOAL #5  Title  Pt will be able to have BM and empty bladder with minimal to no bearing down for reduced stress on her GI tract.    Baseline  reports she is bearing down even more    Status  On-going            Plan - 08/03/19 0841    Clinical Impression Statement  Pt arrived late so limited with time today.  Pt began with 18m resting tone.  Reduced to 6-8 with LE rocking. Pt did not have much result from other stretches.  She did have small amount of tone reduction with increasing time to exhale so making exhale longer than inhale.  Pt will continue to benefit from PT at this time.    PT Treatment/Interventions  ADLs/Self Care Home Management;Electrical Stimulation;Iontophoresis 432mml Dexamethasone;Traction;Moist Heat;Ultrasound;Gait training;Functional mobility training;Balance training;Therapeutic exercise;Therapeutic activities;Patient/family education;Manual techniques;Passive range of motion;Dry needling;Taping;Energy conservation;Biofeedback;Cryotherapy;Neuromuscular re-education    PT Next Visit Plan  TrA activation; core strength progression, down training    PT Home Exercise Plan  [id number]; added [id number] for pelvic floor    Consulted and Agree with Plan of Care  Patient       Patient will benefit from skilled therapeutic intervention in order to improve the following deficits and impairments:  Improper body mechanics, Pain, Decreased mobility, Decreased activity tolerance, Decreased range of motion, Impaired UE functional use, Decreased balance, Decreased coordination, Increased fascial restricitons, Impaired tone, Decreased strength  Visit Diagnosis: Unspecified lack of  coordination  Muscle weakness (generalized)  Cramp and spasm     Problem List Patient Active Problem List   Diagnosis Date Noted  . Urinary frequency 06/20/2019  . Urinary urgency 06/20/2019  . Weakness of pelvic floor 06/20/2019  . Constipation due to outlet dysfunction   . Essential hypertension, benign 05/30/2019  . 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 History of fall 04/10/2019  . Spondylosis, cervical, with myelopathy 03/17/2019  . 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 Low back pain 03/17/2019  . Non-suppurative otitis media 03/17/2019  . Injury of jawline 03/17/2019  . Disorder of oral soft tissue 03/17/2019  . Reactive depression 06/13/2017  . Chronic pain syndrome 06/13/2017  . Polypharmacy 06/13/2017  . Empty sella syndrome ([place]/23/2019  . Adjustment disorder with mixed anxiety and depressed mood 10/13/2016  . 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 Hypokalemia 02/22/2016  . Constipation, chronic 02/22/2016  . Memory loss due to medical condition 12/14/2015  . Chronic fatigue 12/14/2015  . Recurrent dehydration 09/30/2015  . Chronic nausea 08/23/2015  . Abdominal pain, chronic, epigastric 08/20/2015  . Protein-calorie malnutrition, moderate ([place]06/02/2016  . Mild obstructive sleep apnea 07/08/2015  . Dyslipidemia 07/08/2015  . S/P gastric bypass 07/08/2015  . IIH (idiopathic intracranial hypertension) 06/03/2015  . Vision changes 04/30/2015  . Perceived hearing changes 04/30/2015  . Chronic daily headache 04/21/2014  . Liver lesion 03/21/2013  . Splenic cyst 03/21/2013    JaJule SerPT 08/03/2019, 9:19 AM  [facility] [id number]: [phone] Fax:  [phone]: MaShanan FitzpatrickRN: [id number] of Birth: 11/17/17/1972

## 2019-08-08 ENCOUNTER — Telehealth: Payer: Self-pay | Admitting: Physical Therapy

## 2019-08-08 ENCOUNTER — Ambulatory Visit: Payer: Medicare HMO | Attending: Internal Medicine | Admitting: Physical Therapy

## 2019-08-08 DIAGNOSIS — M6281 Muscle weakness (generalized): Secondary | ICD-10-CM | POA: Insufficient documentation

## 2019-08-08 DIAGNOSIS — R252 Cramp and spasm: Secondary | ICD-10-CM | POA: Insufficient documentation

## 2019-08-08 DIAGNOSIS — R279 Unspecified lack of coordination: Secondary | ICD-10-CM | POA: Insufficient documentation

## 2019-08-08 NOTE — Telephone Encounter (Signed)
Pt called due to no show.  She thought her appointment was tomorrow.  Pt was scheduled for appointment tomorrow  Gustavus Bryant, PT 08/08/19 8:42 AM

## 2019-08-09 ENCOUNTER — Ambulatory Visit: Payer: Medicare HMO | Admitting: Physical Therapy

## 2019-08-09 ENCOUNTER — Encounter: Payer: Self-pay | Admitting: Physical Therapy

## 2019-08-09 DIAGNOSIS — R279 Unspecified lack of coordination: Secondary | ICD-10-CM

## 2019-08-09 DIAGNOSIS — R252 Cramp and spasm: Secondary | ICD-10-CM | POA: Diagnosis not present

## 2019-08-09 DIAGNOSIS — M6281 Muscle weakness (generalized): Secondary | ICD-10-CM | POA: Diagnosis not present

## 2019-08-09 NOTE — Patient Instructions (Signed)
Access Code: [email or web address] URL: [email or web address] Date: 06/30/2019 Prepared by: Jari Favre  Exercises Supine Diaphragmatic Breathing with Pelvic Floor Lengthening - 3 x daily - 7 x weekly - 10 reps - 1 sets Seated Hamstring Stretch - 1 x daily - 7 x weekly - 3 reps - 1 sets - 30 sec hold Seated Transversus Abdominis Bracing - 1 x daily - 7 x weekly - 10 reps - 3 sets Supine Piriformis Stretch - 1 x daily - 7 x weekly - 3 reps - 1 sets - 30 sec hold Supine Double Knee to Chest - 1 x daily - 7 x weekly - 1 sets - 3 reps - 30 sec hold Supine Lower Trunk Rotation - 1 x daily - 7 x weekly - 10 reps - 1 sets - 5 sec hold Sidelying Thoracic Rotation with Open Book - 1 x daily - 7 x weekly - 5 reps - 1 sets - 10 sec hold Supine Transversus Abdominis Bracing - Hands on Thighs - 1 x daily - 7 x weekly - 10 reps - 1 sets - 5 sec hold Cat-Camel to Child's Pose - 1 x daily - 7 x weekly - 5 reps - 1 sets - 10 sec hold Quadruped Alternating Arm Lift - 1 x daily - 7 x weekly - 10 reps - 2 sets

## 2019-08-09 NOTE — Therapy (Signed)
[facility] [address] [place], [place], [address] Phone: [phone]   Fax:  [phone]  Physical Therapy Treatment Progress Note Reporting Period 06/12/19 to 08/09/19  See note below for Objective Data and Assessment of Progress/Goals.      Patient Details  Name: Margaret Fletcher MRN: [id number] Date of Birth: 10-21-1970 Referring Provider (PT): Gatha Mayer, MD   Encounter Date: 08/09/2019  PT End of Session - 08/09/19 1454    Visit Number  10    Number of Visits  13    Date for PT Re-Evaluation  10/04/19    Authorization Type  [employer]    PT Start Time  1446    PT Stop Time  1528    PT Time Calculation (min)  42 min    Activity Tolerance  Patient tolerated treatment well    Behavior During Therapy  [facility] for tasks assessed/performed       Past Medical History:  Diagnosis Date  . Adie's pupil, left   . Alopecia   . Anxiety   . Autoimmune disease ([place])    "Alopecia"  . Bacterial vaginosis   . Cyst of spleen   . Depression   . Disorder of oral soft tissue 03/17/2019  . Frequent UTI   . Hypertension   . Hypertrophy of breast   . IIH (idiopathic intracranial hypertension) 2017   "related to headaches"  . Injury of jawline 03/17/2019  . Migraine headache    denies, ruled out - Propranolol.Using for headaches  . Non-suppurative otitis media 03/17/2019    Past Surgical History:  Procedure Laterality Date  . ANAL RECTAL MANOMETRY N/A 06/02/2019   Procedure: ANO RECTAL MANOMETRY;  Surgeon: Gatha Mayer, MD;  Location: [facility];  Service: Endoscopy;  Laterality: N/A;  . APPENDECTOMY    . BREAST EXCISIONAL BIOPSY Right 2017   removed at time of reduction  . BREAST REDUCTION SURGERY Bilateral 03/26/2015   Procedure: BILATERAL BREAST REDUCTION  ;  Surgeon: Youlanda Roys, MD;  Location: [facility];  Service: Plastics;  Laterality: Bilateral;  . BREATH TEK H PYLORI  N/A 05/21/2014   Procedure: BREATH TEK H PYLORI;  Surgeon: Greer Pickerel, MD;  Location: [facility];  Service: General;  Laterality: N/A;  . CELIAC PLEXUS BLOCK  07/18/2018   Per patient it was done at [facility]    . CESAREAN SECTION    . CHOLECYSTECTOMY    . DILITATION & CURRETTAGE/HYSTROSCOPY WITH NOVASURE ABLATION N/A 07/19/2012   Procedure: HYSTEROSCOPY WITH NOVASURE ABLATION;  Surgeon: Farrel Gobble. Harrington Challenger, MD;  Location: [facility];  Service: Gynecology;  Laterality: N/A;  . ENDOMETRIAL ABLATION    . ESOPHAGOGASTRODUODENOSCOPY N/A 08/16/2015   Procedure: UPPER ESOPHAGOGASTRODUODENOSCOPY (EGD);  Surgeon: Greer Pickerel, MD;  Location: [facility];  Service: General;  Laterality: N/A;  . ESOPHAGOGASTRODUODENOSCOPY N/A 02/21/2016   Procedure: ESOPHAGOGASTRODUODENOSCOPY (EGD);  Surgeon: Greer Pickerel, MD;  Location: [facility];  Service: General;  Laterality: N/A;  . ESOPHAGOGASTRODUODENOSCOPY N/A 02/24/2017   Procedure: ESOPHAGOGASTRODUODENOSCOPY (EGD);  Surgeon: Greer Pickerel, MD;  Location: [facility];  Service: General;  Laterality: N/A;  . IR GENERIC HISTORICAL  10/04/2015   IR [place] Vivianne Master 10/04/2015 Markus Daft, MD [facility]  . IR GENERIC HISTORICAL  01/31/2016   IR [place] GASTRO/COLONIC TUBE PERCUT W/FLUORO 01/31/2016 Corrie Mckusick, DO [facility]  . IR GENERIC HISTORICAL  04/06/2016   IR [place] GASTRO/COLONIC TUBE PERCUT W/FLUORO  04/06/2016 Arne Cleveland, MD [facility]  . IR GENERIC HISTORICAL  05/14/2016   IR GASTRIC TUBE PERC CHG W/O IMG GUIDE 05/14/2016 Sandi Mariscal, MD [facility]  . LAPAROSCOPIC GASTROSTOMY N/A 09/30/2015   Procedure: LAPAROSCOPIC GASTROSTOMY TUBE PLACEMENT;  Surgeon: Greer Pickerel, MD;  Location: [facility];  Service: General;  Laterality: N/A;  . LAPAROSCOPIC ROUX-EN-Y GASTRIC BYPASS WITH HIATAL HERNIA REPAIR N/A 07/08/2015   Procedure: LAPAROSCOPIC ROUX-EN-Y GASTRIC BYPASS WITH HIATAL HERNIA REPAIR WITH UPPER ENDOSCOPY;  Surgeon:  Greer Pickerel, MD;  Location: [facility];  Service: General;  Laterality: N/A;  . LAPAROSCOPIC SMALL BOWEL RESECTION N/A 09/30/2015   Procedure: LAPAROSCOPIC REVISION OF ROUX LIMB;  Surgeon: Greer Pickerel, MD;  Location: [facility];  Service: General;  Laterality: N/A;  . LAPAROSCOPY N/A 09/30/2015   Procedure: LAPAROSCOPY DIAGNOSTIC;  Surgeon: Greer Pickerel, MD;  Location: [facility];  Service: General;  Laterality: N/A;  . REDUCTION MAMMAPLASTY Bilateral 2017  . TUBAL LIGATION    . WISDOM TOOTH EXTRACTION      There were no vitals filed for this visit.  Subjective Assessment - 08/09/19 1457    Subjective  Pt states she is having the urge to have the BM more and emptying more completely but it still takes several tries and feels backed up.  Less abdominal pain.    Patient Stated Goals  decreased pain overall, able to have a BM    Currently in Pain?  Yes    Pain Score  3     Pain Location  Abdomen    Pain Descriptors / Indicators  Aching    Pain Type  Chronic pain    Pain Onset  More than a month ago    Multiple Pain Sites  No         [facility] PT Assessment - 08/09/19 0001      Assessment   Medical Diagnosis  constipation    Referring Provider (PT)  Gatha Mayer, MD                 Pelvic Floor Special Questions - 08/09/19 0001    Pelvic Floor Internal Exam  identity confirmed and pt informed consent given    Palpation  normal tone; difficulty coordination to bulge muscles, but also improved and not tightening with bearing down    Strength  fair squeeze, definite lift        [facility] Adult PT Treatment/Exercise - 08/09/19 0001      Neuro Re-ed    Neuro Re-ed Details   educated in TrA activation in quadruped; cues to contract and relax correctly with breathing with internal cues      Lumbar Exercises: Aerobic   Nustep  L3 x 6 min - PT present for status update      Lumbar Exercises: Quadruped   [place]  10 reps    Single Arm Raise  Right;Left;10 reps    Other Quadruped  Lumbar Exercises  childs pose; child pose side bend - 3 x 10 sec             PT Education - 08/09/19 1529    Education Details  Access Code: [email or web address]    Person(s) Educated  Patient    Methods  Explanation;Demonstration;Verbal cues;Handout    Comprehension  Verbalized understanding;Returned demonstration       PT Short Term Goals - 06/20/19 0935      PT SHORT TERM GOAL #1   Title  Pt will be Ind c an initial HEP  and toileting techniques    Baseline  used toileting techniques successfully today    Status  Achieved        PT Long Term Goals - 08/09/19 1612      PT LONG TERM GOAL #1   Title  Pt will be Ind c a final HEP to improve bowel function    Baseline  ind with current HEP; working on core strength and still progressing    Time  8    Period  Weeks    Status  Partially Met    Target Date  10/04/19      PT LONG TERM GOAL #2   Title  Pt will demonstrate understanding of postures and activities which have positive and negative affects on her pain.    Baseline  have done biofeedback to demonstrate stretches that reduce tone    Time  8    Period  Weeks    Status  On-going    Target Date  10/04/19      PT LONG TERM GOAL #3   Title  Pt will report a pain range of 0-3/10 with functional activities such as eating and BM    Baseline  still can be up to 10/10 with BM but able to use less or no enema at times    Time  8    Period  Weeks    Status  On-going    Target Date  10/04/19      PT LONG TERM GOAL #4   Title  Pt will demonstrate pelvic floor strength of 3/5 and able to hold for at least 15 seconds for improved intra-abdominal pressure during activities such as walking    Baseline  continues to have elevated tone and difficutly bulging, no able to work on strengthening yet    Status  On-going      PT LONG TERM GOAL #5   Title  Pt will be able to have BM and empty bladder with minimal to no bearing down for reduced stress on her GI tract.    Baseline  reports  this is improving now    Time  8    Period  Weeks    Status  On-going    Target Date  10/04/19            Plan - 08/09/19 1615    Clinical Impression Statement  Pt reports she is feeling better overall.  Pt has made progress as mentioned with goals.  Re-assessment of pelvic floor reveals she is able to bear down with improved coordination.  She lacks muscle length and continues to have difficutly bulging the pelvic floor muscles..  Pt will benefit from skilled PT to continue to work on progress strength and muscle length    Examination-Activity Limitations  Bed Mobility;Bathing;Lift;Bend;Carry;Dressing;Sleep;Reach Overhead;Stand;Sit;Transfers;Toileting;Continence    Rehab Potential  Good    PT Frequency  1x / week    PT Duration  8 weeks    PT Treatment/Interventions  ADLs/Self Care Home Management;Electrical Stimulation;Iontophoresis 19m/ml Dexamethasone;Traction;Moist Heat;Ultrasound;Gait training;Functional mobility training;Balance training;Therapeutic exercise;Therapeutic activities;Patient/family education;Manual techniques;Passive range of motion;Dry needling;Taping;Energy conservation;Biofeedback;Cryotherapy;Neuromuscular re-education    PT Next Visit Plan  TrA activation; core strength progression, down training/ bulging    PT Home Exercise Plan  [id number]; added [id number] for pelvic floor    Consulted and Agree with Plan of Care  Patient       Patient will benefit from skilled therapeutic intervention in order to improve the following deficits and impairments:  Improper  body mechanics, Pain, Decreased mobility, Decreased activity tolerance, Decreased range of motion, Impaired UE functional use, Decreased balance, Decreased coordination, Increased fascial restricitons, Impaired tone, Decreased strength  Visit Diagnosis: Unspecified lack of coordination  Muscle weakness (generalized)  Cramp and spasm     Problem List Patient Active Problem List   Diagnosis Date Noted  .  Urinary frequency 06/20/2019  . Urinary urgency 06/20/2019  . Weakness of pelvic floor 06/20/2019  . Constipation due to outlet dysfunction   . Essential hypertension, benign 05/30/2019  . 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 History of fall 04/10/2019  . Spondylosis, cervical, with myelopathy 03/17/2019  . 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 Low back pain 03/17/2019  . Non-suppurative otitis media 03/17/2019  . Injury of jawline 03/17/2019  . Disorder of oral soft tissue 03/17/2019  . Reactive depression 06/13/2017  . Chronic pain syndrome 06/13/2017  . Polypharmacy 06/13/2017  . Empty sella syndrome ([place]) 05/01/2017  . Adjustment disorder with mixed anxiety and depressed mood 10/13/2016  . 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 Hypokalemia 02/22/2016  . Constipation, chronic 02/22/2016  . Memory loss due to medical condition 12/14/2015  . Chronic fatigue 12/14/2015  . Recurrent dehydration 09/30/2015  . Chronic nausea 08/23/2015  . Abdominal pain, chronic, epigastric 08/20/2015  . Protein-calorie malnutrition, moderate ([place]) 08/19/2015  . Mild obstructive sleep apnea 07/08/2015  . Dyslipidemia 07/08/2015  . S/P gastric bypass 07/08/2015  . IIH (idiopathic intracranial hypertension) 06/03/2015  . Vision changes 04/30/2015  . Perceived hearing changes 04/30/2015  . Chronic daily headache 04/21/2014  . Liver lesion 03/21/2013  . Splenic cyst 03/21/2013    Jule Ser, PT 08/09/2019, 5:39 PM  [facility] [address] [place], [place], [address] Phone: [phone]   Fax:  [phone]  Name: Rachna Schonberger MRN: [id number] Date of Birth: September 27, 1970

## 2019-08-10 ENCOUNTER — Encounter: Payer: Self-pay | Admitting: Physical Therapy

## 2019-08-10 ENCOUNTER — Ambulatory Visit (INDEPENDENT_AMBULATORY_CARE_PROVIDER_SITE_OTHER): Payer: Medicare HMO | Admitting: Family Medicine

## 2019-08-10 ENCOUNTER — Other Ambulatory Visit: Payer: Self-pay

## 2019-08-10 ENCOUNTER — Ambulatory Visit: Payer: Medicare HMO | Admitting: Physical Therapy

## 2019-08-10 VITALS — BP 125/78 | Ht 68.0 in | Wt 182.0 lb

## 2019-08-10 DIAGNOSIS — R252 Cramp and spasm: Secondary | ICD-10-CM | POA: Diagnosis not present

## 2019-08-10 DIAGNOSIS — R279 Unspecified lack of coordination: Secondary | ICD-10-CM

## 2019-08-10 DIAGNOSIS — M6281 Muscle weakness (generalized): Secondary | ICD-10-CM

## 2019-08-10 DIAGNOSIS — Z0000 Encounter for general adult medical examination without abnormal findings: Secondary | ICD-10-CM | POA: Diagnosis not present

## 2019-08-10 NOTE — Patient Instructions (Signed)
Thank you for taking time to come for your Medicare Wellness Visit. I appreciate your ongoing commitment to your health goals. Please review the following plan we discussed and let me know if I can assist you in the future.  Margaret Kennedy LPN  Preventive Care 50-50 Years Old, Female Preventive care refers to visits with your health care provider and lifestyle choices that can promote health and wellness. This includes:  A yearly physical exam. This may also be called an annual well check.  Regular dental visits and eye exams.  Immunizations.  Screening for certain conditions.  Healthy lifestyle choices, such as eating a healthy diet, getting regular exercise, not using drugs or products that contain nicotine and tobacco, and limiting alcohol use. What can I expect for my preventive care visit? Physical exam Your health care provider will check your:  Height and weight. This may be used to calculate body mass index (BMI), which tells if you are at a healthy weight.  Heart rate and blood pressure.  Skin for abnormal spots. Counseling Your health care provider may ask you questions about your:  Alcohol, tobacco, and drug use.  Emotional well-being.  Home and relationship well-being.  Sexual activity.  Eating habits.  Work and work Statistician.  Method of birth control.  Menstrual cycle.  Pregnancy history. What immunizations do I need?  Influenza (flu) vaccine  This is recommended every year. Tetanus, diphtheria, and pertussis (Tdap) vaccine  You may need a Td booster every 10 years. Varicella (chickenpox) vaccine  You may need this if you have not been vaccinated. Zoster (shingles) vaccine  You may need this after age 25. Measles, mumps, and rubella (MMR) vaccine  You may need at least one dose of MMR if you were born in 1957 or later. You may also need a second dose. Pneumococcal conjugate (PCV13) vaccine  You may need this if you have certain conditions  and were not previously vaccinated. Pneumococcal polysaccharide (PPSV23) vaccine  You may need one or two doses if you smoke cigarettes or if you have certain conditions. Meningococcal conjugate (MenACWY) vaccine  You may need this if you have certain conditions. Hepatitis A vaccine  You may need this if you have certain conditions or if you travel or work in places where you may be exposed to hepatitis A. Hepatitis B vaccine  You may need this if you have certain conditions or if you travel or work in places where you may be exposed to hepatitis B. Haemophilus influenzae type b (Hib) vaccine  You may need this if you have certain conditions. Human papillomavirus (HPV) vaccine  If recommended by your health care provider, you may need three doses over 6 months. You may receive vaccines as individual doses or as more than one vaccine together in one shot (combination vaccines). Talk with your health care provider about the risks and benefits of combination vaccines. What tests do I need? Blood tests  Lipid and cholesterol levels. These may be checked every 5 years, or more frequently if you are over 50 years old.  Hepatitis C test.  Hepatitis B test. Screening  Lung cancer screening. You may have this screening every year starting at age 8 if you have a 30-pack-year history of smoking and currently smoke or have quit within the past 15 years.  Colorectal cancer screening. All adults should have this screening starting at age 50 and continuing until age 42. Your health care provider may recommend screening at age 50 if you are  at increased risk. You will have tests every 1-10 years, depending on your results and the type of screening test.  Diabetes screening. This is done by checking your blood sugar (glucose) after you have not eaten for a while (fasting). You may have this done every 1-3 years.  Mammogram. This may be done every 1-2 years. Talk with your health care provider  about when you should start having regular mammograms. This may depend on whether you have a family history of breast cancer.  BRCA-related cancer screening. This may be done if you have a family history of breast, ovarian, tubal, or peritoneal cancers.  Pelvic exam and Pap test. This may be done every 3 years starting at age 50. Starting at age 50, this may be done every 5 years if you have a Pap test in combination with an HPV test. Other tests  Sexually transmitted disease (STD) testing.  Bone density scan. This is done to screen for osteoporosis. You may have this scan if you are at high risk for osteoporosis. Follow these instructions at home: Eating and drinking  Eat a diet that includes fresh fruits and vegetables, whole grains, lean protein, and low-fat dairy.  Take vitamin and mineral supplements as recommended by your health care provider.  Do not drink alcohol if: ? Your health care provider tells you not to drink. ? You are pregnant, may be pregnant, or are planning to become pregnant.  If you drink alcohol: ? Limit how much you have to 0-1 drink a day. ? Be aware of how much alcohol is in your drink. In the [place] one drink equals one 12 oz bottle of beer (355 mL), one 5 oz glass of wine (148 mL), or one 1 oz glass of hard liquor (44 mL). Lifestyle  Take daily care of your teeth and gums.  Stay active. Exercise for at least 30 minutes on 5 or more days each week.  Do not use any products that contain nicotine or tobacco, such as cigarettes, e-cigarettes, and chewing tobacco. If you need help quitting, ask your health care provider.  If you are sexually active, practice safe sex. Use a condom or other form of birth control (contraception) in order to prevent pregnancy and STIs (sexually transmitted infections).  If told by your health care provider, take low-dose aspirin daily starting at age 67. What's next?  Visit your health care provider once a year for a well  check visit.  Ask your health care provider how often you should have your eyes and teeth checked.  Stay up to date on all vaccines. This information is not intended to replace advice given to you by your health care provider. Make sure you discuss any questions you have with your health care provider. Document Revised: 11/04/2017 Document Reviewed: 11/04/2017 Elsevier Patient Education  2020 [employer].

## 2019-08-10 NOTE — Progress Notes (Signed)
Presents today for [employer] Visit   Date of last exam: 07-03-2019  Interpreter used for this visit? No  I connected with  Karleigh Gongora Hayes-Brown on 08/10/19 by a telephone and verified that I am speaking with the correct person using two identifiers.   I discussed the limitations of evaluation and management by telemedicine. The patient expressed understanding and agreed to proceed.   Patient Care Team: Rutherford Guys, MD as PCP - General (Family Medicine)  Pickerel, MD as Consulting Physician (General Surgery) Melvenia Beam, MD as Consulting Physician (Neurology)   Other items to address today:   Discussed Eye/Dental Discussed immunizations Follow up scheduled Dr. Pamella Pert 09-04-2019 @ 10:20     Immunization status:  Immunization History  Administered Date(s) Administered  . Influenza,inj,Quad PF,6+ Mos 01/03/2015, 11/29/2015, 03/14/2018, 11/28/2018  . Influenza,inj,quad, With Preservative 12/19/2013     Health Maintenance Due  Topic Date Due  . COVID-19 Vaccine (1) Never done     Functional Status Survey: Is the patient deaf or have difficulty hearing?: No Does the patient have difficulty seeing, even when wearing glasses/contacts?: No Does the patient have difficulty concentrating, remembering, or making decisions?: No Does the patient have difficulty walking or climbing stairs?: No Does the patient have difficulty dressing or bathing?: No Does the patient have difficulty doing errands alone such as visiting a doctor's office or shopping?: No   6CIT Screen 08/10/2019  What Year? 0 points  What month? 0 points  What time? 0 points  Count back from 20 0 points  Months in reverse 4 points  Repeat phrase 6 points  Total Score 10         Home Environment:   Lives in one story home with husband No trouble climbing stairs  No Grab bars No scattered rugs Adequate lighting/no clutter   Patient Active Problem List   Diagnosis Date Noted  . Urinary frequency 06/20/2019  . Urinary urgency 06/20/2019  . Weakness of pelvic floor 06/20/2019  . Constipation due to outlet dysfunction   . Essential hypertension, benign 05/30/2019  . 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 History of fall 04/10/2019  . Spondylosis, cervical, with myelopathy 03/17/2019  . 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 Low back pain 03/17/2019  . Non-suppurative otitis media 03/17/2019  . Injury of jawline 03/17/2019  . Disorder of oral soft tissue 03/17/2019  . Reactive depression 06/13/2017  . Chronic pain syndrome 06/13/2017  . Polypharmacy 06/13/2017  . Empty sella syndrome ([place]) 05/01/2017  . Adjustment disorder with mixed anxiety and depressed mood 10/13/2016  . 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 Hypokalemia 02/22/2016  . Constipation, chronic 02/22/2016  . Memory loss due to medical condition 12/14/2015  . Chronic fatigue 12/14/2015  . Recurrent dehydration 09/30/2015  . Chronic nausea 08/23/2015  . Abdominal pain, chronic, epigastric 08/20/2015  . Protein-calorie malnutrition, moderate ([place]) 08/19/2015  . Mild obstructive sleep apnea 07/08/2015  . Dyslipidemia 07/08/2015  . S/P gastric bypass 07/08/2015  . IIH (idiopathic intracranial hypertension) 06/03/2015  . Vision changes 04/30/2015  . Perceived hearing changes 04/30/2015  . Chronic daily headache 04/21/2014  . Liver lesion 03/21/2013  . Splenic cyst 03/21/2013     Past Medical History:  Diagnosis Date  . Adie's pupil, left   . Alopecia   . Anxiety   . Autoimmune disease ([place])    "Alopecia"  . Bacterial vaginosis   . Cyst of spleen   .  Depression   . Disorder of oral soft tissue 03/17/2019  . Frequent UTI   . Hypertension   . Hypertrophy of breast   . IIH (idiopathic intracranial hypertension) 2017   "related to headaches"  . Injury of jawline 03/17/2019  . Migraine headache    denies, ruled out -  Propranolol.Using for headaches  . Non-suppurative otitis media 03/17/2019     Past Surgical History:  Procedure Laterality Date  . ANAL RECTAL MANOMETRY N/A 06/02/2019   Procedure: ANO RECTAL MANOMETRY;  Surgeon: Gatha Mayer, MD;  Location: [facility];  Service: Endoscopy;  Laterality: N/A;  . APPENDECTOMY    . BREAST EXCISIONAL BIOPSY Right 2017   removed at time of reduction  . BREAST REDUCTION SURGERY Bilateral 03/26/2015   Procedure: BILATERAL BREAST REDUCTION  ;  Surgeon: Youlanda Roys, MD;  Location: [place] [facility];  Service: Plastics;  Laterality: Bilateral;  . BREATH TEK H PYLORI N/A 05/21/2014   Procedure: BREATH TEK H PYLORI;  Surgeon:  Pickerel, MD;  Location: [facility];  Service: General;  Laterality: N/A;  . CELIAC PLEXUS BLOCK  07/18/2018   Per patient it was done at [facility]    . CESAREAN SECTION    . CHOLECYSTECTOMY    . DILITATION & CURRETTAGE/HYSTROSCOPY WITH NOVASURE ABLATION N/A 07/19/2012   Procedure: HYSTEROSCOPY WITH NOVASURE ABLATION;  Surgeon: Farrel Gobble. Harrington Challenger, MD;  Location: [facility];  Service: Gynecology;  Laterality: N/A;  . ENDOMETRIAL ABLATION    . ESOPHAGOGASTRODUODENOSCOPY N/A 08/16/2015   Procedure: UPPER ESOPHAGOGASTRODUODENOSCOPY (EGD);  Surgeon:  Pickerel, MD;  Location: [facility];  Service: General;  Laterality: N/A;  . ESOPHAGOGASTRODUODENOSCOPY N/A 02/21/2016   Procedure: ESOPHAGOGASTRODUODENOSCOPY (EGD);  Surgeon:  Pickerel, MD;  Location: [facility];  Service: General;  Laterality: N/A;  . ESOPHAGOGASTRODUODENOSCOPY N/A 02/24/2017   Procedure: ESOPHAGOGASTRODUODENOSCOPY (EGD);  Surgeon:  Pickerel, MD;  Location: [facility];  Service: General;  Laterality: N/A;  . IR GENERIC HISTORICAL  10/04/2015   IR [place] Vivianne Master 10/04/2015 Markus Daft, MD [facility]  . IR GENERIC HISTORICAL  01/31/2016   IR [place] GASTRO/COLONIC TUBE PERCUT W/FLUORO 01/31/2016 Corrie Mckusick, DO  [facility]  . IR GENERIC HISTORICAL  04/06/2016   IR [place] TUBE PERCUT W/FLUORO 04/06/2016 Arne Cleveland, MD [facility]  . IR GENERIC HISTORICAL  05/14/2016   IR GASTRIC TUBE PERC CHG W/O IMG GUIDE 05/14/2016 Sandi Mariscal, MD [facility]  . LAPAROSCOPIC GASTROSTOMY N/A 09/30/2015   Procedure: LAPAROSCOPIC GASTROSTOMY TUBE PLACEMENT;  Surgeon:  Pickerel, MD;  Location: [facility];  Service: General;  Laterality: N/A;  . LAPAROSCOPIC ROUX-EN-Y GASTRIC BYPASS WITH HIATAL HERNIA REPAIR N/A 07/08/2015   Procedure: LAPAROSCOPIC ROUX-EN-Y GASTRIC BYPASS WITH HIATAL HERNIA REPAIR WITH UPPER ENDOSCOPY;  Surgeon:  Pickerel, MD;  Location: [facility];  Service: General;  Laterality: N/A;  . LAPAROSCOPIC SMALL BOWEL RESECTION N/A 09/30/2015   Procedure: LAPAROSCOPIC REVISION OF ROUX LIMB;  Surgeon:  Pickerel, MD;  Location: [facility];  Service: General;  Laterality: N/A;  . LAPAROSCOPY N/A 09/30/2015   Procedure: LAPAROSCOPY DIAGNOSTIC;  Surgeon:  Pickerel, MD;  Location: [facility];  Service: General;  Laterality: N/A;  . REDUCTION MAMMAPLASTY Bilateral 2017  . TUBAL LIGATION    . WISDOM TOOTH EXTRACTION       Family History  Problem Relation Age of Onset  . Cancer Mother        late 78'-50  . Breast cancer Mother  late 40'-50  . Stroke Father   . Colon polyps Father   . Diabetes Brother   . Seizures Son   . Breast cancer Maternal Aunt        late 40'-50  . Breast cancer Maternal Aunt        late 40'-50  . Breast cancer Maternal Aunt        late 40'-50  . Migraines Neg Hx      Social History   Socioeconomic History  . Marital status: Divorced    Spouse name: Not on file  . Number of children: 2  . Years of education: 41  . Highest education level: Not on file  Occupational History  . 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 Smoking status: Never Smoker  . Smokeless tobacco: Never Used  Substance and Sexual Activity  . Alcohol use: Not Currently  . Drug  use: No  . Sexual activity: Not on file  Other Topics Concern  . Not on file  Social History Narrative   Lives with partner   Caffeine use: minimal coffee   Right-handed   Social Determinants of Health   Financial Resource Strain:   . Difficulty of Paying Living Expenses:   Food Insecurity:   . Worried About Charity fundraiser in the Last Year:   . Arboriculturist in the Last Year:   Transportation Needs:   . Film/video editor (Medical):   Marland Kitchen Lack of Transportation (Non-Medical):   Physical Activity:   . Days of Exercise per Week:   . Minutes of Exercise per Session:   Stress:   . Feeling of Stress :   Social Connections:   . Frequency of Communication with Friends and Family:   . Frequency of Social Gatherings with Friends and Family:   . Attends Religious Services:   . Active Member of Clubs or Organizations:   . Attends Archivist Meetings:   Marland Kitchen Marital Status:   Intimate Partner Violence:   . Fear of Current or Ex-Partner:   . Emotionally Abused:   Marland Kitchen Physically Abused:   . Sexually Abused:      Allergies  Allergen Reactions  . Sulfa Antibiotics Anaphylaxis  . Metoclopramide Other (See Comments)    Elevated prolactin >5x ULN induction lactation when taking scheduled for several months  . Zosyn [Piperacillin Sod-Tazobactam So] Rash    Has patient had a PCN reaction causing immediate rash, facial/tongue/throat swelling, SOB or lightheadedness with hypotension: No Has patient had a PCN reaction causing severe rash involving mucus membranes or skin necrosis: No Has patient had a PCN reaction that required hospitalization: Unknown--inpatient when reaction occurred Has patient had a PCN reaction occurring within the last 10 years: Yes If all of the above answers are "NO", then may proceed with Cephalosporin use.      Prior to Admission medications   Medication Sig Start Date End Date Taking? Authorizing Provider  albuterol (PROVENTIL HFA;VENTOLIN  HFA) 108 (90 Base) MCG/ACT inhaler Inhale 2 puffs into the lungs every 6 (six) hours as needed for wheezing or shortness of breath. 01/25/18  Yes Shawnee Knapp, MD  amitriptyline (ELAVIL) 10 MG tablet TAKE 2 TABLETS BY MOUTH AT BEDTIME. 07/25/19  Yes Rutherford Guys, MD  azelastine (ASTELIN) 0.1 % nasal spray Place 1 spray into both nostrils 2 (two) times daily. Use in each nostril as directed 04/18/19  Yes Maximiano Coss, NP  BAYER MICROLET LANCETS lancets Use as instructed  06/13/17  Yes Shawnee Knapp, MD  BELBUCA 450 MCG FILM Take 450 mcg by mouth daily as needed (breakthrough pain).  01/09/19  Yes [provider]  BELBUCA 600 MCG FILM Take 1 strip by mouth 2 (two) times daily. 04/17/19  Yes [provider]  buPROPion (WELLBUTRIN SR) 150 MG 12 hr tablet Take 1 tablet (150 mg total) by mouth daily. 07/31/19  Yes Merian Capron, MD  busPIRone (BUSPAR) 5 MG tablet TAKE 1 TABLET BY MOUTH 3 TIMES DAILY. 07/25/19  Yes Merian Capron, MD  CALCIUM-VITAMIN D PO Take 1 tablet by mouth 2 (two) times daily. chewable   Yes [provider]  cetirizine (ZYRTEC) 10 MG tablet Take 1 tablet (10 mg total) by mouth daily. Patient taking differently: Take 10 mg by mouth daily as needed for allergies.  04/18/19  Yes Maximiano Coss, NP  chlorhexidine (PERIDEX) 0.12 % solution Use as directed 15 mLs in the mouth or throat 2 (two) times daily. 03/17/19  Yes Wendall Mola, NP  Cyanocobalamin (VITAMIN B-12 SL) Place 1 drop under the tongue daily. 1 dropperful   Yes [provider]  cyclobenzaprine (FLEXERIL) 5 MG tablet TAKE 1 TABLET BY MOUTH 3 TIMES DAILY AS NEEDED FOR MUSCLE SPASMS. MAY TAKE 2 TABS BY MOUTH AT BEDTIME Patient taking differently: Take 5-10 mg by mouth See admin instructions. TAKE 5mg  BY MOUTH 3 TIMES DAILY AS NEEDED FOR MUSCLE SPASMS. MAY TAKE 10mg  BY MOUTH AT BEDTIME 06/17/18  Yes Rutherford Guys, MD  docusate sodium (COLACE) 100 MG capsule Take 100 mg by mouth daily as needed.     Yes [provider]  eletriptan (RELPAX) 40 MG tablet Take 1 tablet (40 mg total) by mouth every 2 (two) hours as needed for migraine or headache. Do not use >2 doses/24 hours 01/09/19  Yes Lomax, Amy, NP  glucose blood (CONTOUR NEXT TEST) test strip 1 each by Other route 4 (four) times daily as needed (hypoglycemia). K91.2 06/10/17  Yes Shawnee Knapp, MD  mometasone (ELOCON) 0.1 % lotion Apply 3 drops topically See admin instructions. 3 drops into affected ear(s) tic for 2-4 days until itching resolves repeat as needed 04/10/19  Yes [provider]  Multiple Vitamins-Minerals (BARIATRIC MULTIVITAMINS/IRON PO) Take 1 tablet by mouth 3 (three) times daily.   Yes [provider]  Omega-3 Fatty Acids (OMEGA-3 FISH OIL PO) Take by mouth daily.   Yes [provider]  ondansetron (ZOFRAN ODT) 4 MG disintegrating tablet Take 1 tablet (4 mg total) by mouth every 8 (eight) hours as needed for nausea or vomiting. 06/13/19  Yes Gatha Mayer, MD  ondansetron (ZOFRAN ODT) 8 MG disintegrating tablet 8mg  ODT q8 hours prn nausea 06/21/19  Yes Palumbo, April, MD  OVER THE COUNTER MEDICATION Meca root   Yes [provider]  OVER THE COUNTER MEDICATION Calcium, magnesium, zinc with D3 (one tablet daily)   Yes [provider]  OVER THE COUNTER MEDICATION Collagen and vitamin c and biotin (takes one daily)   Yes [provider]  [place] for muscle and joint health   Yes [provider]  polyethylene glycol (MIRALAX / GLYCOLAX) packet Take 17 g by mouth daily as needed (for constipation.).    Yes [provider]  PREMARIN vaginal cream Place 1 Applicatorful vaginally 2 (two) times a week.  11/28/18  Yes [provider]  simethicone (MYLICON) 80 MG chewable tablet Chew 80 mg by mouth every 6 (six)  hours as needed for flatulence.   Yes [provider]  topiramate (TOPAMAX) 100 MG tablet Take 1.5 tablets (150  mg total) by mouth daily. 01/09/19  Yes Lomax, Amy, NP  triamterene-hydrochlorothiazide (MAXZIDE-25) 37.5-25 MG tablet Take 1 tablet by mouth daily. 05/30/19  Yes Rutherford Guys, MD  dicyclomine (BENTYL) 20 MG tablet Take 1 tablet (20 mg total) by mouth 2 (two) times daily. Patient not taking: Reported on 08/10/2019 06/21/19   Palumbo, April, MD  famotidine (PEPCID) 20 MG tablet Take 1 tablet (20 mg total) by mouth 2 (two) times daily. Patient not taking: Reported on 08/10/2019 02/28/19   Rutherford Guys, MD  fluticasone [facility]) 50 MCG/ACT nasal spray Place 2 sprays into both nostrils daily. Patient taking differently: Place 2 sprays into both nostrils daily as needed for allergies.  04/18/19   Maximiano Coss, NP  hydrocortisone (ANUSOL-HC) 25 MG suppository Place 1 suppository (25 mg total) rectally at bedtime. Use for 5 nights 03/23/19   Noralyn Pick, NP  naloxegol oxalate (MOVANTIK) 25 MG TABS tablet Take 1 tablet (25 mg total) by mouth daily. Patient not taking: Reported on 08/10/2019 04/19/19   Gatha Mayer, MD  nitrofurantoin, macrocrystal-monohydrate, (MACROBID) 100 MG capsule Take 1 capsule (100 mg total) by mouth 2 (two) times daily. 05/30/19   Rutherford Guys, MD  omeprazole (PRILOSEC) 20 MG capsule Take 1 capsule (20 mg total) by mouth every morning. Patient not taking: Reported on 08/10/2019 03/23/19   Noralyn Pick, NP  potassium chloride (KLOR-CON) 10 MEQ tablet Take 1 tablet (10 mEq total) by mouth 3 (three) times daily. Patient not taking: Reported on 08/10/2019 06/30/19   Rutherford Guys, MD  Prucalopride Succinate (MOTEGRITY) 2 MG TABS Take 1 tablet (2 mg total) by mouth daily. 05/23/19   Gatha Mayer, MD  desvenlafaxine (PRISTIQ) 100 MG 24 hr tablet TAKE 1 TABLET BY MOUTH ONCE A DAY Patient taking differently: Take 100 mg by mouth daily.  03/20/19 07/31/19  Rutherford Guys, MD     Depression screen [facility] 2/9 08/10/2019 06/20/2019 05/30/2019 04/18/2019 04/03/2019    Decreased Interest 2 0 1 0 0  Down, Depressed, Hopeless - 0 1 0 0  PHQ - 2 Score 2 0 2 0 0  Altered sleeping 3 0 1 0 0  Tired, decreased energy 3 0 0 0 0  Change in appetite 2 0 1 0 0  Feeling bad or failure about yourself  0 0 1 0 0  Trouble concentrating 0 0 2 0 0  Moving slowly or fidgety/restless 0 0 0 0 0  Suicidal thoughts 0 0 0 0 0  PHQ-9 Score 10 0 7 0 0  Difficult doing work/chores Somewhat difficult Not difficult at all Somewhat difficult Not difficult at all Not difficult at all  Some recent data might be hidden     Fall Risk  08/10/2019 06/20/2019 05/30/2019 04/18/2019 04/03/2019  Falls in the past year? 1 0 0 1 0  Number falls in past yr: 1 0 0 0 0  Comment swelling - - - -  Injury with Fall? 1 0 0 0 0  Comment - - - - -  Follow up Falls evaluation completed;Education provided Falls evaluation completed Falls evaluation completed Falls evaluation completed Falls evaluation completed      PHYSICAL EXAM: BP 125/78 Comment: taken from a previous visit  Ht 5\' 8"  (1.727 m)   Wt 182 lb (82.6 kg)   BMI 27.67 kg/m  Wt Readings from Last 3 Encounters:  08/10/19 182 lb (82.6 kg)  07/03/19 182 lb 4.8 oz (82.7 kg)  06/30/19 183 lb (83 kg)    Medicare annual wellness visit, subsequent    Education/Counseling provided regarding diet and exercise, prevention of chronic diseases, smoking/tobacco cessation, if applicable, and reviewed "Covered Medicare Preventive Services."

## 2019-08-10 NOTE — Therapy (Signed)
[facility] [address], [place], [place], [address] Phone: [phone]   Fax:  [phone]  Physical Therapy Treatment  Patient Details  Name: Natassja Ollis MRN: [id number] Date of Birth: October 13, 1970 Referring Provider (PT): Gatha Mayer, MD   Encounter Date: 08/10/2019  PT End of Session - 08/10/19 0821    Visit Number  11    Date for PT Re-Evaluation  10/04/19    Authorization Type  [employer]    PT Start Time  [phone]   arrived   PT Stop Time  0841    PT Time Calculation (min)  25 min    Activity Tolerance  Patient tolerated treatment well    Behavior During Therapy  [facility] for tasks assessed/performed       Past Medical History:  Diagnosis Date  . Adie's pupil, left   . Alopecia   . Anxiety   . Autoimmune disease ([place])    "Alopecia"  . Bacterial vaginosis   . Cyst of spleen   . Depression   . Disorder of oral soft tissue 03/17/2019  . Frequent UTI   . Hypertension   . Hypertrophy of breast   . IIH (idiopathic intracranial hypertension) 2017   "related to headaches"  . Injury of jawline 03/17/2019  . Migraine headache    denies, ruled out - Propranolol.Using for headaches  . Non-suppurative otitis media 03/17/2019    Past Surgical History:  Procedure Laterality Date  . ANAL RECTAL MANOMETRY N/A 06/02/2019   Procedure: ANO RECTAL MANOMETRY;  Surgeon: Gatha Mayer, MD;  Location: [facility];  Service: Endoscopy;  Laterality: N/A;  . APPENDECTOMY    . BREAST EXCISIONAL BIOPSY Right 2017   removed at time of reduction  . BREAST REDUCTION SURGERY Bilateral 03/26/2015   Procedure: BILATERAL BREAST REDUCTION  ;  Surgeon: Youlanda Roys, MD;  Location: [facility];  Service: Plastics;  Laterality: Bilateral;  . BREATH TEK H PYLORI N/A 05/21/2014   Procedure: BREATH TEK H PYLORI;  Surgeon: Greer Pickerel, MD;  Location: [facility];  Service: General;  Laterality: N/A;  .  CELIAC PLEXUS BLOCK  07/18/2018   Per patient it was done at [facility]    . CESAREAN SECTION    . CHOLECYSTECTOMY    . DILITATION & CURRETTAGE/HYSTROSCOPY WITH NOVASURE ABLATION N/A 07/19/2012   Procedure: HYSTEROSCOPY WITH NOVASURE ABLATION;  Surgeon: Farrel Gobble. Harrington Challenger, MD;  Location: [facility];  Service: Gynecology;  Laterality: N/A;  . ENDOMETRIAL ABLATION    . ESOPHAGOGASTRODUODENOSCOPY N/A 08/16/2015   Procedure: UPPER ESOPHAGOGASTRODUODENOSCOPY (EGD);  Surgeon: Greer Pickerel, MD;  Location: [facility];  Service: General;  Laterality: N/A;  . ESOPHAGOGASTRODUODENOSCOPY N/A 02/21/2016   Procedure: ESOPHAGOGASTRODUODENOSCOPY (EGD);  Surgeon: Greer Pickerel, MD;  Location: [facility];  Service: General;  Laterality: N/A;  . ESOPHAGOGASTRODUODENOSCOPY N/A 02/24/2017   Procedure: ESOPHAGOGASTRODUODENOSCOPY (EGD);  Surgeon: Greer Pickerel, MD;  Location: [facility];  Service: General;  Laterality: N/A;  . IR GENERIC HISTORICAL  10/04/2015   IR [place] Vivianne Master 10/04/2015 Markus Daft, MD [facility]  . IR GENERIC HISTORICAL  01/31/2016   IR [place] GASTRO/COLONIC TUBE PERCUT W/FLUORO 01/31/2016 Corrie Mckusick, DO [facility]  . IR GENERIC HISTORICAL  04/06/2016   IR [place] TUBE PERCUT W/FLUORO 04/06/2016 Arne Cleveland, MD [facility]  . IR GENERIC HISTORICAL  05/14/2016   IR GASTRIC TUBE PERC CHG W/O IMG GUIDE 05/14/2016 Sandi Mariscal, MD [facility]  . LAPAROSCOPIC GASTROSTOMY N/A 09/30/2015   Procedure: LAPAROSCOPIC GASTROSTOMY TUBE PLACEMENT;  Surgeon: Greer Pickerel, MD;  Location: [facility];  Service: General;  Laterality: N/A;  . LAPAROSCOPIC ROUX-EN-Y GASTRIC BYPASS WITH HIATAL HERNIA REPAIR N/A 07/08/2015   Procedure: LAPAROSCOPIC ROUX-EN-Y GASTRIC BYPASS WITH HIATAL HERNIA REPAIR WITH UPPER ENDOSCOPY;  Surgeon: Greer Pickerel, MD;  Location: [facility];  Service: General;  Laterality: N/A;  . LAPAROSCOPIC SMALL BOWEL RESECTION N/A 09/30/2015   Procedure:  LAPAROSCOPIC REVISION OF ROUX LIMB;  Surgeon: Greer Pickerel, MD;  Location: [facility];  Service: General;  Laterality: N/A;  . LAPAROSCOPY N/A 09/30/2015   Procedure: LAPAROSCOPY DIAGNOSTIC;  Surgeon: Greer Pickerel, MD;  Location: [facility];  Service: General;  Laterality: N/A;  . REDUCTION MAMMAPLASTY Bilateral 2017  . TUBAL LIGATION    . WISDOM TOOTH EXTRACTION      There were no vitals filed for this visit.  Subjective Assessment - 08/10/19 0819    Subjective  I had progress this morning which is why I am late.  I had a complete BM as far as what my body had to do.  It wasn't a large BM.    Currently in Pain?  Yes    Pain Score  4     Pain Location  Abdomen    Pain Orientation  Lower    Pain Descriptors / Indicators  Discomfort;Dull;Aching    Pain Type  Chronic pain    Pain Onset  More than a month ago    Multiple Pain Sites  No                        [facility] Adult PT Treatment/Exercise - 08/10/19 0001      Lumbar Exercises: Aerobic   Nustep  L3 x 5 min - PT present for status update      Lumbar Exercises: Seated   Long Arc Quad on [place]  Strengthening;Both;10 reps    Hip Flexion on [place]  Strengthening;Both;10 reps    Other Seated Lumbar Exercises  on ball; shoulder row and ext 15# pwer tower - 20x      Lumbar Exercises: Quadruped   Single Arm Raise  Right;Left;10 reps    Opposite Arm/Leg Raise  Right arm/Left leg;Left arm/Right leg;10 reps    Other Quadruped Lumbar Exercises  on paball               PT Short Term Goals - 06/20/19 0935      PT SHORT TERM GOAL #1   Title  Pt will be Ind c an initial HEP and toileting techniques    Baseline  used toileting techniques successfully today    Status  Achieved        PT Long Term Goals - 08/09/19 1612      PT LONG TERM GOAL #1   Title  Pt will be Ind c a final HEP to improve bowel function    Baseline  ind with current HEP; working on core strength and still progressing    Time  8    Period  Weeks     Status  Partially Met    Target Date  10/04/19      PT LONG TERM GOAL #2   Title  Pt will demonstrate understanding of postures and activities which have positive and negative affects on her pain.    Baseline  have done biofeedback to demonstrate stretches that reduce tone    Time  8  Period  Weeks    Status  On-going    Target Date  10/04/19      PT LONG TERM GOAL #3   Title  Pt will report a pain range of 0-3/10 with functional activities such as eating and BM    Baseline  still can be up to 10/10 with BM but able to use less or no enema at times    Time  8    Period  Weeks    Status  On-going    Target Date  10/04/19      PT LONG TERM GOAL #4   Title  Pt will demonstrate pelvic floor strength of 3/5 and able to hold for at least 15 seconds for improved intra-abdominal pressure during activities such as walking    Baseline  continues to have elevated tone and difficutly bulging, no able to work on strengthening yet    Status  On-going      PT LONG TERM GOAL #5   Title  Pt will be able to have BM and empty bladder with minimal to no bearing down for reduced stress on her GI tract.    Baseline  reports this is improving now    Time  8    Period  Weeks    Status  On-going    Target Date  10/04/19            Plan - 08/10/19 0829    Clinical Impression Statement  Pt was able to progress core strengthening today.  She states she had better time having a BM this morning as well.  Pt continues to make porgress.  She needed cues for posture and monitored for pain and core activation throughout.    PT Treatment/Interventions  ADLs/Self Care Home Management;Electrical Stimulation;Iontophoresis 35m/ml Dexamethasone;Traction;Moist Heat;Ultrasound;Gait training;Functional mobility training;Balance training;Therapeutic exercise;Therapeutic activities;Patient/family education;Manual techniques;Passive range of motion;Dry needling;Taping;Energy  conservation;Biofeedback;Cryotherapy;Neuromuscular re-education    PT Next Visit Plan  TrA activation; core strength progression    PT Home Exercise Plan  [id number]; added [id number] for pelvic floor    Consulted and Agree with Plan of Care  Patient       Patient will benefit from skilled therapeutic intervention in order to improve the following deficits and impairments:  Improper body mechanics, Pain, Decreased mobility, Decreased activity tolerance, Decreased range of motion, Impaired UE functional use, Decreased balance, Decreased coordination, Increased fascial restricitons, Impaired tone, Decreased strength  Visit Diagnosis: Unspecified lack of coordination  Muscle weakness (generalized)  Cramp and spasm     Problem List Patient Active Problem List   Diagnosis Date Noted  . Urinary frequency 06/20/2019  . Urinary urgency 06/20/2019  . Weakness of pelvic floor 06/20/2019  . Constipation due to outlet dysfunction   . Essential hypertension, benign 05/30/2019  . 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 History of fall 04/10/2019  . Spondylosis, cervical, with myelopathy 03/17/2019  . 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 Low back pain 03/17/2019  . Non-suppurative otitis media 03/17/2019  . Injury of jawline 03/17/2019  . Disorder of oral soft tissue 03/17/2019  . Reactive depression 06/13/2017  . Chronic pain syndrome 06/13/2017  . Polypharmacy 06/13/2017  . Empty sella syndrome ([place] 05/01/2017  . Adjustment disorder with mixed anxiety and depressed mood 10/13/2016  . 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 Hypokalemia 02/22/2016  . Constipation, chronic 02/22/2016  . Memory loss due to medical condition 12/14/2015  . Chronic fatigue 12/14/2015  . Recurrent dehydration  09/30/2015  . Chronic nausea 08/23/2015  . Abdominal pain, chronic, epigastric 08/20/2015  . Protein-calorie malnutrition, moderate ([place]) 08/19/2015  . Mild  obstructive sleep apnea 07/08/2015  . Dyslipidemia 07/08/2015  . S/P gastric bypass 07/08/2015  . IIH (idiopathic intracranial hypertension) 06/03/2015  . Vision changes 04/30/2015  . Perceived hearing changes 04/30/2015  . Chronic daily headache 04/21/2014  . Liver lesion 03/21/2013  . Splenic cyst 03/21/2013    Jule Ser, PT 08/10/2019, 8:45 AM  [facility] [address] [place], [place], [address] Phone: [phone]   Fax:  [phone]  Name: Haleema Vanderheyden MRN: [id number] Date of Birth: 11-Apr-1970

## 2019-08-11 ENCOUNTER — Encounter: Payer: Self-pay | Admitting: Family Medicine

## 2019-08-15 ENCOUNTER — Encounter: Payer: Medicare HMO | Admitting: Physical Therapy

## 2019-08-17 ENCOUNTER — Ambulatory Visit: Payer: Medicare HMO | Admitting: Physical Therapy

## 2019-08-18 ENCOUNTER — Other Ambulatory Visit: Payer: Self-pay

## 2019-08-18 ENCOUNTER — Ambulatory Visit (INDEPENDENT_AMBULATORY_CARE_PROVIDER_SITE_OTHER): Payer: Medicare HMO | Admitting: Licensed Clinical Social Worker

## 2019-08-18 DIAGNOSIS — F331 Major depressive disorder, recurrent, moderate: Secondary | ICD-10-CM | POA: Diagnosis not present

## 2019-08-18 DIAGNOSIS — R5382 Chronic fatigue, unspecified: Secondary | ICD-10-CM

## 2019-08-18 DIAGNOSIS — F411 Generalized anxiety disorder: Secondary | ICD-10-CM | POA: Diagnosis not present
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
## 2019-08-21 MED FILL — BELBUCA 600 MCG FILM: 600 | 30 days supply | Qty: 60 | Fill #1

## 2019-08-29 ENCOUNTER — Ambulatory Visit (INDEPENDENT_AMBULATORY_CARE_PROVIDER_SITE_OTHER): Payer: Medicare HMO | Admitting: Family Medicine

## 2019-08-29 ENCOUNTER — Other Ambulatory Visit: Payer: Self-pay

## 2019-08-29 ENCOUNTER — Ambulatory Visit: Payer: Medicare HMO | Admitting: Family Medicine

## 2019-08-29 ENCOUNTER — Encounter: Payer: Self-pay | Admitting: Family Medicine

## 2019-08-29 VITALS — BP 118/80 | HR 83 | Ht 68.0 in | Wt 184.0 lb

## 2019-08-29 DIAGNOSIS — H57052 Tonic pupil, left eye: Secondary | ICD-10-CM | POA: Diagnosis not present

## 2019-08-29 DIAGNOSIS — G43709 Chronic migraine without aura, not intractable, without status migrainosus: Secondary | ICD-10-CM

## 2019-08-29 MED ORDER — TOPIRAMATE 100 MG PO TABS: 150.0000 mg | ORAL_TABLET | Freq: Every day | ORAL | 1 refills | Status: DC

## 2019-08-29 MED ORDER — ELETRIPTAN HYDROBROMIDE 40 MG PO TABS: 40.0000 mg | ORAL_TABLET | ORAL | 5 refills | Status: AC | PRN

## 2019-08-29 MED ORDER — EMGALITY 120 MG/ML ~~LOC~~ SOAJ: 240.0000 mg | SUBCUTANEOUS | 0 refills | Status: AC

## 2019-08-29 MED FILL — TOPIRAMATE 100 MG TABLET: 100 | 90 days supply | Qty: 135 | Fill #0

## 2019-08-29 NOTE — Patient Instructions (Signed)
We will continue topiramate 150mg  daily for now. We will start Emgality every 30 days. Start 2 injections this month then continue 1 injection every 30 days. 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 Try to limit use of abortive medications.   Continue close follow with PCP   Stay well hydrated. Healthy diet and regular exercise.   Follow up in 3-6 months pending response to Emgality.   Galcanezumab injection What is this medicine? GALCANEZUMAB (gal ka NEZ ue mab) is used to prevent migraines and treat cluster headaches. This medicine may be used for other purposes; ask your health care provider or pharmacist if you have questions. COMMON BRAND NAME(S): Emgality What should I tell my health care provider before I take this medicine? They need to know if you have any of these conditions:  an unusual or allergic reaction to galcanezumab, other medicines, foods, dyes, or preservatives  pregnant or trying to get pregnant  breast-feeding How should I use this medicine? This medicine is for injection under the skin. You will be taught how to prepare and give this medicine. Use exactly as directed. Take your medicine at regular intervals. Do not take your medicine more often than directed. It is important that you put your used needles and syringes in a special sharps container. Do not put them in a trash can. If you do not have a sharps container, call your pharmacist or healthcare provider to get one. Talk to your pediatrician regarding the use of this medicine in children. Special care may be needed. Overdosage: If you think you have taken too much of this medicine contact a poison control center or emergency room at once. NOTE: This medicine is only for you. Do not share this medicine with others. What if I miss a dose? If you miss a dose, take it as soon as you can. If it is almost time for your next dose, take only that dose. Do not take double or extra doses. What may interact  with this medicine? Interactions are not expected. This list may not describe all possible interactions. Give your health care provider a list of all the medicines, herbs, non-prescription drugs, or dietary supplements you use. Also tell them if you smoke, drink alcohol, or use illegal drugs. Some items may interact with your medicine. What should I watch for while using this medicine? Tell your doctor or healthcare professional if your symptoms do not start to get better or if they get worse. What side effects may I notice from receiving this medicine? Side effects that you should report to your doctor or health care professional as soon as possible:  allergic reactions like skin rash, itching or hives, swelling of the face, lips, or tongue Side effects that usually do not require medical attention (report these to your doctor or health care professional if they continue or are bothersome):  pain, redness, or irritation at site where injected This list may not describe all possible side effects. Call your doctor for medical advice about side effects. You may report side effects to FDA at 1-800-FDA-1088. Where should I keep my medicine? Keep out of the reach of children. You will be instructed on how to store this medicine. Throw away any unused medicine after the expiration date on the label. NOTE: This sheet is a summary. It may not cover all possible information. If you have questions about this medicine, talk to your doctor, pharmacist, or health care provider.  2020 [employer]vier/[employer] Standard (2017-08-11 12:03:23)  Migraine Headache A migraine headache is a very strong throbbing pain on one side or both sides of your head. This type of headache can also cause other symptoms. It can last from 4 hours to 3 days. Talk with your doctor about what things may bring on (trigger) this condition. What are the causes? The exact cause of this condition is not known. This condition may be triggered or  caused by:  Drinking alcohol.  Smoking.  Taking medicines, such as: ? Medicine used to treat chest pain (nitroglycerin). ? Birth control pills. ? Estrogen. ? Some blood pressure medicines.  Eating or drinking certain products.  Doing physical activity. Other things that may trigger a migraine headache include:  Having a menstrual period.  Pregnancy.  Hunger.  Stress.  Not getting enough sleep or getting too much sleep.  Weather changes.  Tiredness (fatigue). What increases the risk?  Being 9-46 years old.  Being female.  Having a family history of migraine headaches.  Being Caucasian.  Having depression or anxiety.  Being very overweight. What are the signs or symptoms?  A throbbing pain. This pain may: ? Happen in any area of the head, such as on one side or both sides. ? Make it hard to do daily activities. ? Get worse with physical activity. ? Get worse around bright lights or loud noises.  Other symptoms may include: ? Feeling sick to your stomach (nauseous). ? Vomiting. ? Dizziness. ? Being sensitive to bright lights, loud noises, or smells.  Before you get a migraine headache, you may get warning signs (an aura). An aura may include: ? Seeing flashing lights or having blind spots. ? Seeing bright spots, halos, or zigzag lines. ? Having tunnel vision or blurred vision. ? Having numbness or a tingling feeling. ? Having trouble talking. ? Having weak muscles.  Some people have symptoms after a migraine headache (postdromal phase), such as: ? Tiredness. ? Trouble thinking (concentrating). How is this treated?  Taking medicines that: ? Relieve pain. ? Relieve the feeling of being sick to your stomach. ? Prevent migraine headaches.  Treatment may also include: ? Having acupuncture. ? Avoiding foods that bring on migraine headaches. ? Learning ways to control your body functions (biofeedback). ? Therapy to help you know and deal with  negative thoughts (cognitive behavioral therapy). Follow these instructions at home: Medicines  Take over-the-counter and prescription medicines only as told by your doctor.  Ask your doctor if the medicine prescribed to you: ? Requires you to avoid driving or using heavy machinery. ? Can cause trouble pooping (constipation). You may need to take these steps to prevent or treat trouble pooping:  Drink enough fluid to keep your pee (urine) pale yellow.  Take over-the-counter or prescription medicines.  Eat foods that are high in fiber. These include beans, whole grains, and fresh fruits and vegetables.  Limit foods that are high in fat and sugar. These include fried or sweet foods. Lifestyle  Do not drink alcohol.  Do not use any products that contain nicotine or tobacco, such as cigarettes, e-cigarettes, and chewing tobacco. If you need help quitting, ask your doctor.  Get at least 8 hours of sleep every night.  Limit and deal with stress. General instructions      Keep a journal to find out what may bring on your migraine headaches. For example, write down: ? What you eat and drink. ? How much sleep you get. ? Any change in what you eat or drink. ?  Any change in your medicines.  If you have a migraine headache: ? Avoid things that make your symptoms worse, such as bright lights. ? It may help to lie down in a dark, quiet room. ? Do not drive or use heavy machinery. ? Ask your doctor what activities are safe for you.  Keep all follow-up visits as told by your doctor. This is important. Contact a doctor if:  You get a migraine headache that is different or worse than others you have had.  You have more than 15 headache days in one month. Get help right away if:  Your migraine headache gets very bad.  Your migraine headache lasts longer than 72 hours.  You have a fever.  You have a stiff neck.  You have trouble seeing.  Your muscles feel weak or like you  cannot control them.  You start to lose your balance a lot.  You start to have trouble walking.  You pass out (faint).  You have a seizure. Summary  A migraine headache is a very strong throbbing pain on one side or both sides of your head. These headaches can also cause other symptoms.  This condition may be treated with medicines and changes to your lifestyle.  Keep a journal to find out what may bring on your migraine headaches.  Contact a doctor if you get a migraine headache that is different or worse than others you have had.  Contact your doctor if you have more than 15 headache days in a month. This information is not intended to replace advice given to you by your health care provider. Make sure you discuss any questions you have with your health care provider. Document Revised: 06/17/2018 Document Reviewed: 04/07/2018 [employer] Patient Education  [employer].

## 2019-08-29 NOTE — Progress Notes (Addendum)
PATIENT: Margaret Fletcher DOB: Jun 28, 1970  REASON FOR VISIT: follow up HISTORY FROM: patient  Chief Complaint  Patient presents with  . Follow-up    rm 1 here for a migraine f/u. Pt is having new sx of memory problems     HISTORY OF PRESENT ILLNESS: Today 08/29/19 Margaret Fletcher is a 49 y.o. female here today for follow up for migraines. She continues topiramate 150mg  daily. PCP is prescribing amitriptyline 20mg  for help with headache management and sleep. She uses Relpax for abortive therapy. She uses a full prescription every month. She uses Excedrin migraine 2 tablets at least three times a week. She has been on higher doses of topiramate that was effective in managing her headaches but worsened brain fog. She feels that she is having more trouble with brain fog. She feels that she has difficulty finding words.  She gets distracted easily.  She has forgotten that she had an appointment in the past.  She is now taking Wellbutrin and BuSpar for anxiety and depression.  No longer on Pristiq.  She does continue Belbuca 640mcg twice daily for chronic pain.   HISTORY: (copied from my note on 01/09/2019)  Margaret Fletcher is a 49 y.o. female here today for follow up. She continues topiramate 150mg  daily as well as amitriptyline 10mg  at night for migraine prevention. She reports that headaches are now occurring about 2-3 times a week. She continues to have about 2 migraines a week. She is going through a full prescription of Relpax each month. She has weaned Excedrin and is not taking any other OTC medications for headaches. She reports that eye exam this year was normal. I will request report today. She denies vision changes. She was started on Maxide for BP management and feels that this has helped. She continues Buprenorphine film 300mg  every 12 hours for pain and clonazepam 0.5mg  daily for anxiety. She continues Pristiq for mood. She reports that her PCP left the  practice. She has not been able to get refills of her Adderall. She was referred to psychiatry for continued management. She is on Xulane patch for hot flashes and dysmenorrhea. She has an allergy to sulfa and cannot take Diamox.   HISTORY: (copied from my note on 10/06/2018)  Margaret Fletcher a 49 y.o.femalehere today for follow up for migraines. She has not been seen since 02/2017 due to loss of insurance coverage. She has recently been approved for disability.   She has had increased frequency of headaches and migraines. She has history of IIH and Adie's eye. Headaches are daily with migraines occurring 2-3 times a week. She has noted a runny nose with headaches and feels pressure behind her eyes. She increased her dose of topiramate from 75mg  to 100mg  "a while ago." Last RX written from Dr Pamella Pert was for 45 tablets and 2 refills. She reports that she would get refills early. She is also taking amitriptyline 20mg . She is using a full rx of Relpax every month, sometimes in the first three weeks. Last rx written 03/23/2018 for 10 tablets with 2 refills. She very rarely uses Excedrin, only if she has to. She reports recent eye exam was normal with no concerns of optic nerve swelling.   She is s/p bypass surgery. 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 She is taking buprenorphine 366mcg daily. She is taking clonazepam 0.5mg  daily. Pristiq for depression.  She does not feel her BP has been elevated in the past. She notes elevation today but feels it is related to pain. She has noted dizziness and vertiginous symptoms. Dizziness sometimes when standing up. She reports being well hydrated. She reports concerns of hypoglycemia in absence of diagnosis of diabetes. Readings fluctuate between 50-160.   HISTORY: (copied fromMegan Millikan'snote on 02/22/2017)  Today12/17/18]Margaret Fletcher is a  49 year old female with a history of Adie's eyeand migraine headaches. She returns today for follow-up. She states in November she developed a severe headache. She states that it was accompanied by heaviness in the right arm and in the legs. She states that she had trouble holding things in the right hand. She went to the emergency room and diagnosed with a complex migraine. She reports that once her headache was better theother symptoms improved as well. She states that since her visit to the ED in November she has essentially has had a headache daily. She reports that some days her headaches are worse than others. Had a sleep study in the past that did not show sleep apnea. She is currently on Topamax 50 mg at bedtime and amitriptyline 20 mg at bedtime. She reports that her primary care recently put her on Pristiq for depression. She continues to use Relpax for her headaches. In the past the patient's topamaxdose was decreased due to worsening fatigue,depression and memory problems. She does feel that since her Topamax dose has been decreased her headaches have gotten worse. She remains on amitriptyline 20 mg at bedtime. She does report that it makes her feel very groggy the next morning. She returns today for an evaluation.  HISTORY7/9/18:Margaret L Hayesis a 49 y.o.femalehere as a referral from Dr. Brain Hilts dilated pupil. She was diagnosed with Adie's eye. She had a headache, some headache, ear congestion and then noticed that her pupil was dilated. Her eye gets sore even now. She has lost 120 pounds. Her headaches are better. It is hard to eat. She was denied for disability. Itis in appeal. She applied for medicaid. She wears sunglasses. She is having blurry vision in the eye. The left eye is blurry. She was evaluated by ophthalmology. Going outside is hard. No improvement. She takes 150mg  Topiramate daily. She is still having headaches every day, she takes eletriptan, She takes the  eletriptan when the headaches are migrainous. She has daily headaches that last all day long. She is under a lot of stress, headaches better when it is dark, the headache feels like eye strain.  Reviewed notes, labs and imaging from outside physicians, which showed:   CT headshowed No acute intracranial abnormalities including mass lesion or mass effect, hydrocephalus, extra-axial fluid collection, midline shift, hemorrhage, or acute infarction, large ischemic events (personally reviewed images)   hgba1c 5.3, RPR NR   REVIEW OF SYSTEMS: Out of a complete 14 system review of symptoms, the patient complains only of the following symptoms, headaches, brain fog and all other reviewed systems are negative.  ALLERGIES: Allergies  Allergen Reactions  . Sulfa Antibiotics Anaphylaxis  . Metoclopramide Other (See Comments)    Elevated prolactin >5x ULN induction lactation when taking scheduled for several months  . Zosyn [Piperacillin Sod-Tazobactam So] Rash    Has patient had a PCN reaction causing immediate rash, facial/tongue/throat swelling, SOB or lightheadedness with hypotension: No Has patient had a PCN reaction causing severe rash involving mucus membranes or skin necrosis: No Has patient had a PCN reaction that required hospitalization: Unknown--inpatient when reaction occurred Has  patient had a PCN reaction occurring within the last 10 years: Yes If all of the above answers are "NO", then may proceed with Cephalosporin use.     HOME MEDICATIONS: Outpatient Medications Prior to Visit  Medication Sig Dispense Refill  . albuterol (PROVENTIL HFA;VENTOLIN HFA) 108 (90 Base) MCG/ACT inhaler Inhale 2 puffs into the lungs every 6 (six) hours as needed for wheezing or shortness of breath. 1 Inhaler 0  . amitriptyline (ELAVIL) 10 MG tablet TAKE 2 TABLETS BY MOUTH AT BEDTIME. 60 tablet 2  . azelastine (ASTELIN) 0.1 % nasal spray Place 1 spray into both nostrils 2 (two) times daily. Use  in each nostril as directed 30 mL 0  . BAYER MICROLET LANCETS lancets Use as instructed 100 each 12  . BELBUCA 600 MCG FILM Take 1 strip by mouth 2 (two) times daily.    Marland Kitchen buPROPion (WELLBUTRIN SR) 150 MG 12 hr tablet Take 1 tablet (150 mg total) by mouth daily. 30 tablet 1  . busPIRone (BUSPAR) 5 MG tablet TAKE 1 TABLET BY MOUTH 3 TIMES DAILY. 90 tablet 0  . CALCIUM-VITAMIN D PO Take 1 tablet by mouth 2 (two) times daily. chewable    . chlorhexidine (PERIDEX) 0.12 % solution Use as directed 15 mLs in the mouth or throat 2 (two) times daily. 120 mL 0  . Cyanocobalamin (VITAMIN B-12 SL) Place 1 drop under the tongue daily. 1 dropperful    . cyclobenzaprine (FLEXERIL) 5 MG tablet TAKE 1 TABLET BY MOUTH 3 TIMES DAILY AS NEEDED FOR MUSCLE SPASMS. MAY TAKE 2 TABS BY MOUTH AT BEDTIME (Patient taking differently: Take 5-10 mg by mouth See admin instructions. TAKE 5mg  BY MOUTH 3 TIMES DAILY AS NEEDED FOR MUSCLE SPASMS. MAY TAKE 10mg  BY MOUTH AT BEDTIME) 40 tablet 2  . docusate sodium (COLACE) 100 MG capsule Take 100 mg by mouth daily as needed.     . famotidine (PEPCID) 20 MG tablet Take 1 tablet (20 mg total) by mouth 2 (two) times daily. 60 tablet 2  . fluticasone (FLONASE) 50 MCG/ACT nasal spray Place 2 sprays into both nostrils daily. (Patient taking differently: Place 2 sprays into both nostrils daily as needed for allergies. ) 16 g 6  . glucose blood (CONTOUR NEXT TEST) test strip 1 each by Other route 4 (four) times daily as needed (hypoglycemia). K91.2 270 each 5  . hydrocortisone (ANUSOL-HC) 25 MG suppository Place 1 suppository (25 mg total) rectally at bedtime. Use for 5 nights 5 suppository 0  . Multiple Vitamins-Minerals (BARIATRIC MULTIVITAMINS/IRON PO) Take 1 tablet by mouth 3 (three) times daily.    . naloxegol oxalate (MOVANTIK) 25 MG TABS tablet Take 1 tablet (25 mg total) by mouth daily. 30 tablet 1  . Omega-3 Fatty Acids (OMEGA-3 FISH OIL PO) Take by mouth daily.    Marland Kitchen omeprazole  (PRILOSEC) 20 MG capsule Take 1 capsule (20 mg total) by mouth every morning. 30 capsule 1  . ondansetron (ZOFRAN ODT) 4 MG disintegrating tablet Take 1 tablet (4 mg total) by mouth every 8 (eight) hours as needed for nausea or vomiting. 20 tablet 5  . ondansetron (ZOFRAN ODT) 8 MG disintegrating tablet 8mg  ODT q8 hours prn nausea 10 tablet 0  . OVER THE COUNTER MEDICATION Meca root    . OVER THE COUNTER MEDICATION Calcium, magnesium, zinc with D3 (one tablet daily)    . OVER THE COUNTER MEDICATION Collagen and vitamin c and biotin (takes one daily)    . OVER THE COUNTER  MEDICATION Tunic for muscle and joint health    . polyethylene glycol (MIRALAX / GLYCOLAX) packet Take 17 g by mouth daily as needed (for constipation.).     Marland Kitchen potassium chloride (KLOR-CON) 10 MEQ tablet Take 1 tablet (10 mEq total) by mouth 3 (three) times daily. 12 tablet 0  . PREMARIN vaginal cream Place 1 Applicatorful vaginally 2 (two) times a week.     . Prucalopride Succinate (MOTEGRITY) 2 MG TABS Take 1 tablet (2 mg total) by mouth daily. 14 tablet 0  . simethicone (MYLICON) 80 MG chewable tablet Chew 80 mg by mouth every 6 (six) hours as needed for flatulence.    . triamterene-hydrochlorothiazide (MAXZIDE-25) 37.5-25 MG tablet Take 1 tablet by mouth daily. 90 tablet 1  . BELBUCA 450 MCG FILM Take 450 mcg by mouth daily as needed (breakthrough pain).     Marland Kitchen eletriptan (RELPAX) 40 MG tablet Take 1 tablet (40 mg total) by mouth every 2 (two) hours as needed for migraine or headache. Do not use >2 doses/24 hours 10 tablet 5  . topiramate (TOPAMAX) 100 MG tablet Take 1.5 tablets (150 mg total) by mouth daily. 135 tablet 1  . cetirizine (ZYRTEC) 10 MG tablet Take 1 tablet (10 mg total) by mouth daily. (Patient taking differently: Take 10 mg by mouth daily as needed for allergies. ) 30 tablet 11  . dicyclomine (BENTYL) 20 MG tablet Take 1 tablet (20 mg total) by mouth 2 (two) times daily. (Patient not taking: Reported on 08/10/2019)  20 tablet 0  . mometasone (ELOCON) 0.1 % lotion Apply 3 drops topically See admin instructions. 3 drops into affected ear(s) tic for 2-4 days until itching resolves repeat as needed    . nitrofurantoin, macrocrystal-monohydrate, (MACROBID) 100 MG capsule Take 1 capsule (100 mg total) by mouth 2 (two) times daily. 20 capsule 0   No facility-administered medications prior to visit.    PAST MEDICAL HISTORY: Past Medical History:  Diagnosis Date  . Adie's pupil, left   . Alopecia   . Anxiety   . Autoimmune disease ([place])    "Alopecia"  . Bacterial vaginosis   . Cyst of spleen   . Depression   . Disorder of oral soft tissue 03/17/2019  . Frequent UTI   . Hypertension   . Hypertrophy of breast   . IIH (idiopathic intracranial hypertension) 2017   "related to headaches"  . Injury of jawline 03/17/2019  . Migraine headache    denies, ruled out - Propranolol.Using for headaches  . Non-suppurative otitis media 03/17/2019    PAST SURGICAL HISTORY: Past Surgical History:  Procedure Laterality Date  . ANAL RECTAL MANOMETRY N/A 06/02/2019   Procedure: ANO RECTAL MANOMETRY;  Surgeon: Gatha Mayer, MD;  Location: [facility];  Service: Endoscopy;  Laterality: N/A;  . APPENDECTOMY    . BREAST EXCISIONAL BIOPSY Right 2017   removed at time of reduction  . BREAST REDUCTION SURGERY Bilateral 03/26/2015   Procedure: BILATERAL BREAST REDUCTION  ;  Surgeon: Youlanda Roys, MD;  Location: [facility];  Service: Plastics;  Laterality: Bilateral;  . BREATH TEK H PYLORI N/A 05/21/2014   Procedure: BREATH TEK H PYLORI;  Surgeon: Greer Pickerel, MD;  Location: [facility];  Service: General;  Laterality: N/A;  . CELIAC PLEXUS BLOCK  07/18/2018   Per patient it was done at [facility]    . CESAREAN SECTION    . CHOLECYSTECTOMY    . DILITATION & CURRETTAGE/HYSTROSCOPY WITH  NOVASURE ABLATION N/A 07/19/2012   Procedure: HYSTEROSCOPY WITH NOVASURE ABLATION;  Surgeon:  Farrel Gobble. Harrington Challenger, MD;  Location: [facility];  Service: Gynecology;  Laterality: N/A;  . ENDOMETRIAL ABLATION    . ESOPHAGOGASTRODUODENOSCOPY N/A 08/16/2015   Procedure: UPPER ESOPHAGOGASTRODUODENOSCOPY (EGD);  Surgeon: Greer Pickerel, MD;  Location: [facility];  Service: General;  Laterality: N/A;  . ESOPHAGOGASTRODUODENOSCOPY N/A 02/21/2016   Procedure: ESOPHAGOGASTRODUODENOSCOPY (EGD);  Surgeon: Greer Pickerel, MD;  Location: [facility];  Service: General;  Laterality: N/A;  . ESOPHAGOGASTRODUODENOSCOPY N/A 02/24/2017   Procedure: ESOPHAGOGASTRODUODENOSCOPY (EGD);  Surgeon: Greer Pickerel, MD;  Location: [facility];  Service: General;  Laterality: N/A;  . IR GENERIC HISTORICAL  10/04/2015   IR [place] Vivianne Master 10/04/2015 Markus Daft, MD [facility]  . IR GENERIC HISTORICAL  01/31/2016   IR [place] GASTRO/COLONIC TUBE PERCUT W/FLUORO 01/31/2016 Corrie Mckusick, DO [facility]  . IR GENERIC HISTORICAL  04/06/2016   IR [place] TUBE PERCUT W/FLUORO 04/06/2016 Arne Cleveland, MD [facility]  . IR GENERIC HISTORICAL  05/14/2016   [facility] GASTR[facility] TUBE PERC CHG W/O IMG GUIDE 05/14/2016 Sandi Mariscal, MD [facility]  . LAPAROSCOPIC GASTROSTOMY N/A 09/30/2015   Procedure: LAPAROSCOPIC GASTROSTOMY TUBE PLACEMENT;  Surgeon: Greer Pickerel, MD;  Location: [facility];  Service: General;  Laterality: N/A;  . LAPAROSCOPIC ROUX-EN-Y GASTRIC BYPASS WITH HIATAL HERNIA REPAIR N/A 07/08/2015   Procedure: LAPAROSCOPIC ROUX-EN-Y GASTRIC BYPASS WITH HIATAL HERNIA REPAIR WITH UPPER ENDOSCOPY;  Surgeon: Greer Pickerel, MD;  Location: [facility];  Service: General;  Laterality: N/A;  . LAPAROSCOPIC SMALL BOWEL RESECTION N/A 09/30/2015   Procedure: LAPAROSCOPIC REVISION OF ROUX LIMB;  Surgeon: Greer Pickerel, MD;  Location: [facility];  Service: General;  Laterality: N/A;  . LAPAROSCOPY N/A 09/30/2015   Procedure: LAPAROSCOPY DIAGNOSTIC;  Surgeon: Greer Pickerel, MD;  Location: [facility];  Service: General;  Laterality: N/A;  .  REDUCTION MAMMAPLASTY Bilateral 2017  . TUBAL LIGATION    . WISDOM TOOTH EXTRACTION      FAMILY HISTORY: Family History  Problem Relation Age of Onset  . Cancer Mother        late 10'-50  . Breast cancer Mother        late 39'-50  . Stroke Father   . Colon polyps Father   . Diabetes Brother   . Seizures Son   . Breast cancer Maternal Aunt        late 40'-50  . Breast cancer Maternal Aunt        late 40'-50  . Breast cancer Maternal Aunt        late 40'-50  . Migraines Neg Hx     SOCIAL HISTORY: Social History   Socioeconomic History  . Marital status: Divorced    Spouse name: Not on file  . Number of children: 2  . Years of education: 62  . Highest education level: Not on file  Occupational History  . 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 Smoking status: Never Smoker  . Smokeless tobacco: Never Used  Vaping Use  . Vaping Use: Never used  Substance and Sexual Activity  . Alcohol use: Not Currently  . Drug use: No  . Sexual activity: Not on file  Other Topics Concern  . Not on file  Social History Narrative   Lives with partner   Caffeine use: minimal coffee   Right-handed   Social Determinants of Health   Financial Resource Strain:   . Difficulty of Paying Living Expenses:  Food Insecurity:   . Worried About Charity fundraiser in the Last Year:   . Arboriculturist in the Last Year:   Transportation Needs:   . Film/video editor (Medical):   Marland Kitchen Lack of Transportation (Non-Medical):   Physical Activity:   . Days of Exercise per Week:   . Minutes of Exercise per Session:   Stress:   . Feeling of Stress :   Social Connections:   . Frequency of Communication with Friends and Family:   . Frequency of Social Gatherings with Friends and Family:   . Attends Religious Services:   . Active Member of Clubs or Organizations:   . Attends Archivist Meetings:   Marland Kitchen Marital Status:   Intimate Partner Violence:   . Fear of Current or  Ex-Partner:   . Emotionally Abused:   Marland Kitchen Physically Abused:   . Sexually Abused:       PHYSICAL EXAM  Vitals:   08/29/19 1117  BP: 118/80  Pulse: 83  Weight: 184 lb (83.5 kg)  Height: 5\' 8"  (1.727 m)   Body mass index is 27.98 kg/m.  Generalized: Well developed, in no acute distress  Cardiology: normal rate and rhythm, no murmur noted Respiratory: clear to auscultation bilaterally  Neurological examination  Mentation: Alert oriented to time, place, history taking. Follows all commands speech and language fluent Cranial nerve II-XII: Pupils were round and reactive to light. Left pupil> R. Extraocular movements were full, visual field were full  Motor: The motor testing reveals 5 over 5 strength of all 4 extremities. Good symmetric motor tone is noted throughout.  Gait and station: Gait is normal.   DIAGNOSTIC DATA (LABS, IMAGING, TESTING) - I reviewed patient records, labs, notes, testing and imaging myself where available.  No flowsheet data found.   Lab Results  Component Value Date   WBC 10.0 06/30/2019   HGB 13.5 06/30/2019   HCT 40.3 06/30/2019   MCV 91.2 06/30/2019   PLT 326 06/30/2019      Component Value Date/Time   NA 140 07/03/2019 1022   K 3.9 07/03/2019 1022   CL 104 07/03/2019 1022   CO2 21 07/03/2019 1022   GLUCOSE 99 07/03/2019 1022   GLUCOSE 84 06/30/2019 2208   BUN 22 07/03/2019 1022   CREATININE 1.48 (H) 07/03/2019 1022   CREATININE 0.95 11/15/2015 1712   CALCIUM 8.8 07/03/2019 1022   PROT 7.1 06/30/2019 2208   PROT 6.4 02/28/2019 1015   ALBUMIN 3.8 06/30/2019 2208   ALBUMIN 3.8 02/28/2019 1015   AST 26 06/30/2019 2208   ALT 20 06/30/2019 2208   ALKPHOS 103 06/30/2019 2208   BILITOT 0.5 06/30/2019 2208   BILITOT <0.2 02/28/2019 1015   GFRNONAA 41 (L) 07/03/2019 1022   GFRNONAA 73 11/15/2015 1712   GFRAA 48 (L) 07/03/2019 1022   GFRAA 84 11/15/2015 1712   Lab Results  Component Value Date   CHOL 207 (H) 02/28/2019   HDL 60  02/28/2019   LDLCALC 131 (H) 02/28/2019   TRIG 92 02/28/2019   CHOLHDL 3.5 02/28/2019   Lab Results  Component Value Date   HGBA1C 5.3 03/16/2016   Lab Results  Component Value Date   VITAMINB12 1,551 (H) 03/14/2018   Lab Results  Component Value Date   TSH 1.310 02/28/2019      ASSESSMENT AND PLAN 49 y.o. year old female  has a past medical history of Adie's pupil, left, Alopecia, Anxiety, Autoimmune disease ([place]), Bacterial vaginosis, Cyst  of spleen, Depression, Disorder of oral soft tissue (03/17/2019), Frequent UTI, Hypertension, Hypertrophy of breast, IIH (idiopathic intracranial hypertension) (2017), Injury of jawline (03/17/2019), Migraine headache, and Non-suppurative otitis media (03/17/2019). here with     ICD-10-CM   1. Chronic migraine without aura without status migrainosus, not intractable  G43.709 topiramate (TOPAMAX) 100 MG tablet  2. Adie's tonic pupil, left  H57.052     Wannetta continues to have frequent headaches with migrainous symptoms.  She does feel that topiramate continues to be helpful, however, she has noted more brain fog recently.  We will continue topiramate 150 mg for now.  I will add Emgality injections every 30 days.  She was advised that she will take 2 injections for her first dose and then continue 1 injection every 30 days.  I am hopeful that we may be able to reduce her topiramate dose in the future if Emgality is effective.  We have discussed concerns of brain fog.  I feel that there are multiple medications that are contributing, however, at this time they are beneficial in treating pain and headaches.  She will continue Relpax as needed for abortive therapy but was encouraged to avoid regular use.  We have discussed the need for adequate hydration, well-balanced diet and regular exercise.  Sleep hygiene reviewed.  She will follow-up with me in 3 to 6 months, sooner if needed.  She verbalizes understanding and agreement with this plan.  No orders of the  defined types were placed in this encounter.    Meds ordered this encounter  Medications  . Galcanezumab-gnlm (EMGALITY) 120 MG/ML SOAJ    Sig: Inject 240 mg into the skin every 30 (thirty) days.    Dispense:  2 pen    Refill:  0    Order Specific Question:   Supervising Provider    Answer:   Melvenia Beam [id number]  . topiramate (TOPAMAX) 100 MG tablet    Sig: Take 1.5 tablets (150 mg total) by mouth daily.    Dispense:  135 tablet    Refill:  1    Order Specific Question:   Supervising Provider    Answer:   Melvenia Beam [id number]  . eletriptan (RELPAX) 40 MG tablet    Sig: Take 1 tablet (40 mg total) by mouth every 2 (two) hours as needed for migraine or headache. Do not use >2 doses/24 hours    Dispense:  10 tablet    Refill:  5    Order Specific Question:   Supervising Provider    Answer:   Melvenia Beam [id number]      I spent 15 minutes with the patient. 50% of this time was spent counseling and educating patient on plan of care and medications.    Debbora Presto, FNP-C 08/29/2019, 3:34 PM [facility] [address] [place], [place] [address] [phone]  Made any corrections needed, and agree with history, physical, neuro exam,assessment and plan as stated.     Sarina Ill, MD [facility]

## 2019-08-30 NOTE — Progress Notes (Signed)
Submitted PA for Emgality 120MG /ML on Cover my Meds. KeyMarland Kitchen [facility]  PA Case ID: [id number]  Rx #: [id number]   Status: Approved  Prior Auth: Coverage Start Date: 08/30/19 Coverage End Date: 11/28/2019  Questions? Contact [phone].

## 2019-09-04 ENCOUNTER — Ambulatory Visit: Payer: Medicare HMO | Admitting: Family Medicine

## 2019-09-05 ENCOUNTER — Encounter: Payer: Self-pay | Admitting: Family Medicine

## 2019-09-07 ENCOUNTER — Encounter: Payer: Self-pay | Admitting: Physical Therapy

## 2019-09-07 ENCOUNTER — Ambulatory Visit: Payer: Medicare HMO | Attending: Internal Medicine | Admitting: Physical Therapy

## 2019-09-07 ENCOUNTER — Other Ambulatory Visit: Payer: Self-pay

## 2019-09-07 DIAGNOSIS — R279 Unspecified lack of coordination: Secondary | ICD-10-CM

## 2019-09-07 DIAGNOSIS — M545 Low back pain: Secondary | ICD-10-CM | POA: Insufficient documentation

## 2019-09-07 DIAGNOSIS — M6281 Muscle weakness (generalized): Secondary | ICD-10-CM | POA: Diagnosis not present

## 2019-09-07 DIAGNOSIS — R293 Abnormal posture: Secondary | ICD-10-CM

## 2019-09-07 DIAGNOSIS — R252 Cramp and spasm: Secondary | ICD-10-CM

## 2019-09-07 DIAGNOSIS — M542 Cervicalgia: Secondary | ICD-10-CM

## 2019-09-07 NOTE — Therapy (Signed)
[facility] [address] [place], [place], [address] Phone: [phone]   Fax:  [phone]  Physical Therapy Treatment  Patient Details  Name: Margaret Fletcher MRN: [id number] Date of Birth: 1970/03/27 Referring Provider (PT): Gatha Mayer, MD   Encounter Date: 09/07/2019   PT End of Session - 09/07/19 3785    Visit Number 12    Date for PT Re-Evaluation 10/04/19    Authorization Type [employer]    PT Start Time 1148    PT Stop Time 1228    PT Time Calculation (min) 40 min    Activity Tolerance Patient tolerated treatment well    Behavior During Therapy [facility] for tasks assessed/performed           Past Medical History:  Diagnosis Date  . Adie's pupil, left   . Alopecia   . Anxiety   . Autoimmune disease ([place])    "Alopecia"  . Bacterial vaginosis   . Cyst of spleen   . Depression   . Disorder of oral soft tissue 03/17/2019  . Frequent UTI   . Hypertension   . Hypertrophy of breast   . IIH (idiopathic intracranial hypertension) 2017   "related to headaches"  . Injury of jawline 03/17/2019  . Migraine headache    denies, ruled out - Propranolol.Using for headaches  . Non-suppurative otitis media 03/17/2019    Past Surgical History:  Procedure Laterality Date  . ANAL RECTAL MANOMETRY N/A 06/02/2019   Procedure: ANO RECTAL MANOMETRY;  Surgeon: Gatha Mayer, MD;  [facility];  Service: Endoscopy;  Laterality: N/A;  . APPENDECTOMY    . BREAST EXCISIONAL BIOPSY Right 2017   removed at time of reduction  . BREAST REDUCTION SURGERY Bilateral 03/26/2015   Procedure: BILATERAL BREAST REDUCTION  ;  Surgeon: Youlanda Roys, MD;  Location: [place];  Service: Plastics;  Laterality: Bilateral;  . BREATH TEK H PYLORI N/A 05/21/2014   Procedure: BREATH TEK H PYLORI;  Surgeon: Greer Pickerel, MD;  Location: [facility];  Service: General;  Laterality: N/A;  . CELIAC  PLEXUS BLOCK  07/18/2018   Per patient it was done at [facility]    . CESAREAN SECTION    . CHOLECYSTECTOMY    . DILITATION & CURRETTAGE/HYSTROSCOPY WITH NOVASURE ABLATION N/A 07/19/2012   Procedure: HYSTEROSCOPY WITH NOVASURE ABLATION;  Surgeon: Farrel Gobble. Harrington Challenger, MD;  Location: [facility];  Service: Gynecology;  Laterality: N/A;  . ENDOMETRIAL ABLATION    . ESOPHAGOGASTRODUODENOSCOPY N/A 08/16/2015   Procedure: UPPER ESOPHAGOGASTRODUODENOSCOPY (EGD);  Surgeon: Greer Pickerel, MD;  Location: [facility];  Service: General;  Laterality: N/A;  . ESOPHAGOGASTRODUODENOSCOPY N/A 02/21/2016   Procedure: ESOPHAGOGASTRODUODENOSCOPY (EGD);  Surgeon: Greer Pickerel, MD;  Location: [facility];  Service: General;  Laterality: N/A;  . ESOPHAGOGASTRODUODENOSCOPY N/A 02/24/2017   Procedure: ESOPHAGOGASTRODUODENOSCOPY (EGD);  Surgeon: Greer Pickerel, MD;  Location: [facility];  Service: General;  Laterality: N/A;  . IR GENERIC HISTORICAL  10/04/2015   IR [place] Vivianne Master 10/04/2015 Markus Daft, MD [facility]  . IR GENERIC HISTORICAL  01/31/2016   IR [place] GASTRO/COLONIC TUBE PERCUT W/FLUORO 01/31/2016 Corrie Mckusick, DO [facility]  . IR GENERIC HISTORICAL  04/06/2016   IR [place] TUBE PERCUT W/FLUORO 04/06/2016 Arne Cleveland, MD [facility]  . IR GENERIC HISTORICAL  05/14/2016   [place] GASTRIC TUBE PERC CHG W/O IMG GUIDE 05/14/2016 Sandi Mariscal, MD [facility]  . LAPAROSCOPIC GASTROSTOMY  N/A 09/30/2015   Procedure: LAPAROSCOPIC GASTROSTOMY TUBE PLACEMENT;  Surgeon: Greer Pickerel, MD;  [facility];  Service: General;  Laterality: N/A;  . LAPAROSCOPIC ROUX-EN-Y GASTRIC BYPASS WITH HIATAL HERNIA REPAIR N/A 07/08/2015   Procedure: LAPAROSCOPIC ROUX-EN-Y GASTRIC BYPASS WITH HIATAL HERNIA REPAIR WITH UPPER ENDOSCOPY;  Surgeon: Greer Pickerel, MD;  [facility];  Service: General;  Laterality: N/A;  . LAPAROSCOPIC SMALL BOWEL RESECTION N/A 09/30/2015   Procedure:  LAPAROSCOPIC REVISION OF ROUX LIMB;  Surgeon: Greer Pickerel, MD;  [facility];  Service: General;  Laterality: N/A;  . LAPAROSCOPY N/A 09/30/2015   Procedure: LAPAROSCOPY DIAGNOSTIC;  Surgeon: Greer Pickerel, MD;  [facility];  Service: General;  Laterality: N/A;  . REDUCTION MAMMAPLASTY Bilateral 2017  . TUBAL LIGATION    . WISDOM TOOTH EXTRACTION      There were no vitals filed for this visit.   Subjective Assessment - 09/07/19 1158    Subjective Pt states she is trying hard not to use the enemas.  I am still having a hard time pushing it out. Something new that happened was that my neck and head hurts when I have to strain really hard.    Patient Stated Goals decreased pain overall, able to have a BM    Currently in Pain? Yes    Pain Score --   did not give a number   Pain Location Neck    Pain Descriptors / Indicators Discomfort    Pain Type Chronic pain    Pain Onset More than a month ago    Pain Frequency Intermittent    Aggravating Factors  straining to have a BM    Multiple Pain Sites No                             [facility] Adult PT Treatment/Exercise - 09/07/19 0001      Self-Care   Other Self-Care Comments  BM action plan and reviewed how to perform self massage      Neuro Re-ed    Neuro Re-ed Details  breathing and bulging with pushing for immproved BM; breathing and stretching with cat cow stretch adding rocking movement for increased mobility      Manual Therapy   Manual therapy comments pt identity confirmed and informed consent given to perform soft tissue mobs    Internal Pelvic Floor stretch and contract relax to anal sphincters and puborectalis                  PT Education - 09/07/19 1238    Education Details BM action plan    Person(s) Educated Patient    Methods Explanation;Demonstration;Verbal cues;Handout    Comprehension Verbalized understanding;Returned demonstration            PT Short Term Goals - 06/20/19 0935       PT SHORT TERM GOAL #1   Title Pt will be Ind c an initial HEP and toileting techniques    Baseline used toileting techniques successfully today    Status Achieved             PT Long Term Goals - 08/09/19 1612      PT LONG TERM GOAL #1   Title Pt will be Ind c a final HEP to improve bowel function    Baseline ind with current HEP; working on core strength and still progressing    Time 8    Period Weeks  Status Partially Met    Target Date 10/04/19      PT LONG TERM GOAL #2   Title Pt will demonstrate understanding of postures and activities which have positive and negative affects on her pain.    Baseline have done biofeedback to demonstrate stretches that reduce tone    Time 8    Period Weeks    Status On-going    Target Date 10/04/19      PT LONG TERM GOAL #3   Title Pt will report a pain range of 0-3/10 with functional activities such as eating and BM    Baseline still can be up to 10/10 with BM but able to use less or no enema at times    Time 8    Period Weeks    Status On-going    Target Date 10/04/19      PT LONG TERM GOAL #4   Title Pt will demonstrate pelvic floor strength of 3/5 and able to hold for at least 15 seconds for improved intra-abdominal pressure during activities such as walking    Baseline continues to have elevated tone and difficutly bulging, no able to work on strengthening yet    Status On-going      PT LONG TERM GOAL #5   Title Pt will be able to have BM and empty bladder with minimal to no bearing down for reduced stress on her GI tract.    Baseline reports this is improving now    Time 8    Period Weeks    Status On-going    Target Date 10/04/19                 Plan - 09/07/19 1248    Clinical Impression Statement Pt did well with tactile and verbal cues and was able to get stronger bulge and push.  Pt she was educated on self massage.  PT observed a hard nodule around anus and hypothesized a mucous duct blockage.  Pt will  follow up with MD as needed if this enlarges or does not go away.  Pt will benefit from skilled PT to continue to progress core and posture strength and muscle coordination.    PT Treatment/Interventions ADLs/Self Care Home Management;Electrical Stimulation;Iontophoresis 28m/ml Dexamethasone;Traction;Moist Heat;Ultrasound;Gait training;Functional mobility training;Balance training;Therapeutic exercise;Therapeutic activities;Patient/family education;Manual techniques;Passive range of motion;Dry needling;Taping;Energy conservation;Biofeedback;Cryotherapy;Neuromuscular re-education    PT Next Visit Plan TrA activation; core strength progression    PT Home Exercise Plan [id number]; added [id number] for pelvic floor    Consulted and Agree with Plan of Care Patient           Patient will benefit from skilled therapeutic intervention in order to improve the following deficits and impairments:  Improper body mechanics, Pain, Decreased mobility, Decreased activity tolerance, Decreased range of motion, Impaired UE functional use, Decreased balance, Decreased coordination, Increased fascial restricitons, Impaired tone, Decreased strength  Visit Diagnosis: Unspecified lack of coordination  Muscle weakness (generalized)  Cramp and spasm  Cervicalgia  Abnormal posture     Problem List Patient Active Problem List   Diagnosis Date Noted  . Urinary frequency 06/20/2019  . Urinary urgency 06/20/2019  . Weakness of pelvic floor 06/20/2019  . Constipation due to outlet dysfunction   . Essential hypertension, benign 05/30/2019  . 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 History of fall 04/10/2019  . Spondylosis, cervical, with myelopathy 03/17/2019  . 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 Low back pain 03/17/2019  . Non-suppurative otitis  media 03/17/2019  . Injury of jawline 03/17/2019  . Disorder of oral soft tissue 03/17/2019  . Reactive depression 06/13/2017    . Chronic pain syndrome 06/13/2017  . Polypharmacy 06/13/2017  . Empty sella syndrome ([place]) 05/01/2017  . Adjustment disorder with mixed anxiety and depressed mood 10/13/2016  . 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 Hypokalemia 02/22/2016  . Constipation, chronic 02/22/2016  . Memory loss due to medical condition 12/14/2015  . Chronic fatigue 12/14/2015  . Recurrent dehydration 09/30/2015  . Chronic nausea 08/23/2015  . Abdominal pain, chronic, epigastric 08/20/2015  . Protein-calorie malnutrition, moderate ([place]) 08/19/2015  . Mild obstructive sleep apnea 07/08/2015  . Dyslipidemia 07/08/2015  . S/P gastric bypass 07/08/2015  . IIH (idiopathic intracranial hypertension) 06/03/2015  . Vision changes 04/30/2015  . Perceived hearing changes 04/30/2015  . Chronic daily headache 04/21/2014  . Liver lesion 03/21/2013  . Splenic cyst 03/21/2013    Jule Ser, PT 09/07/2019, 1:14 PM  [facility] [address] [place], [place], [address] Phone: [phone]   Fax:  [phone]  Name: Margaret Fletcher MRN: [id number] Date of Birth: [birth year]

## 2019-09-07 NOTE — Patient Instructions (Addendum)
Action Plan:  1. Push as you breathe out and then rest 2. If the urge goes away get up and walk 3. Do stretches - cat cow, rocking, trunk rotation 4. Put a glove on and use coconut oil and massage and stretch

## 2019-09-12 ENCOUNTER — Ambulatory Visit (HOSPITAL_COMMUNITY): Payer: Medicare HMO | Admitting: Licensed Clinical Social Worker

## 2019-09-13 ENCOUNTER — Ambulatory Visit (INDEPENDENT_AMBULATORY_CARE_PROVIDER_SITE_OTHER): Payer: Medicare HMO | Admitting: Registered Nurse

## 2019-09-13 ENCOUNTER — Encounter: Payer: Self-pay | Admitting: Registered Nurse

## 2019-09-13 ENCOUNTER — Telehealth: Payer: Self-pay | Admitting: Family Medicine

## 2019-09-13 ENCOUNTER — Other Ambulatory Visit: Payer: Self-pay

## 2019-09-13 VITALS — BP 128/91 | HR 99 | Temp 97.6°F | Resp 18 | Ht 68.0 in | Wt 186.4 lb

## 2019-09-13 DIAGNOSIS — H60392 Other infective otitis externa, left ear: Secondary | ICD-10-CM | POA: Diagnosis not present

## 2019-09-13 DIAGNOSIS — R350 Frequency of micturition: Secondary | ICD-10-CM | POA: Diagnosis not present

## 2019-09-13 DIAGNOSIS — N3000 Acute cystitis without hematuria: Secondary | ICD-10-CM

## 2019-09-13 LAB — POCT URINALYSIS DIP (CLINITEK)
Bilirubin, UA: NEGATIVE
Blood, UA: NEGATIVE
Glucose, UA: NEGATIVE mg/dL
Ketones, POC UA: NEGATIVE mg/dL
Leukocytes, UA: NEGATIVE
Nitrite, UA: POSITIVE — AB
POC PROTEIN,UA: NEGATIVE
Spec Grav, UA: 1.025 (ref 1.010–1.025)
Urobilinogen, UA: 1 E.U./dL
pH, UA: 5 (ref 5.0–8.0)

## 2019-09-13 MED ORDER — NITROFURANTOIN MONOHYD MACRO 100 MG PO CAPS: 100.0000 mg | ORAL_CAPSULE | Freq: Two times a day (BID) | ORAL | 0 refills | Status: AC

## 2019-09-13 MED ORDER — FLUCONAZOLE 150 MG PO TABS: 150.0000 mg | ORAL_TABLET | Freq: Once | ORAL | 0 refills | Status: AC

## 2019-09-13 MED ORDER — CIPROFLOXACIN-HYDROCORTISONE 0.2-1 % OT SUSP: 3.0000 [drp] | Freq: Two times a day (BID) | OTIC | 0 refills | Status: AC

## 2019-09-13 MED FILL — NITROFURANTOIN MONO-MCR 100: 100 | 5 days supply | Qty: 10 | Fill #0

## 2019-09-13 MED FILL — FLUCONAZOLE 150 MG TABS: 150 | 1 days supply | Qty: 1 | Fill #0

## 2019-09-13 NOTE — Patient Instructions (Signed)
° ° ° °  If you have lab work done today you will be contacted with your lab results within the next 2 weeks.  If you have not heard from us then please contact us. The fastest way to get your results is to register for My Chart. ° ° °IF you received an x-ray today, you will receive an invoice from [facility]. Please contact [facility] at [phone] with questions or concerns regarding your invoice.  ° °IF you received labwork today, you will receive an invoice from [facility]. Please contact [facility] at [phone] with questions or concerns regarding your invoice.  ° °Our billing staff will not be able to assist you with questions regarding bills from these companies. ° °You will be contacted with the lab results as soon as they are available. The fastest way to get your results is to activate your My Chart account. Instructions are located on the last page of this paperwork. If you have not heard from us regarding the results in 2 weeks, please contact this office. °  ° ° ° °

## 2019-09-13 NOTE — Telephone Encounter (Signed)
Received fax from [facility] stating that the PA for eletriptan 40mg  had been denied. PA was denied because the medication is not on [employer]'s list of covered drugs. Covered drugs include rizatriptan and sumatriptan. Patient has tried and failed both of these medications. Will submit an appeal to [facility].

## 2019-09-14 MED FILL — BUPROPION HCL SR 150 MG TAB: 150 | 30 days supply | Qty: 30 | Fill #1

## 2019-09-15 ENCOUNTER — Encounter: Payer: Self-pay | Admitting: Registered Nurse

## 2019-09-15 ENCOUNTER — Other Ambulatory Visit: Payer: Self-pay | Admitting: Registered Nurse

## 2019-09-15 DIAGNOSIS — H60392 Other infective otitis externa, left ear: Secondary | ICD-10-CM

## 2019-09-15 LAB — URINE CULTURE

## 2019-09-15 MED ORDER — CIPROFLOXACIN HCL 0.2 % OT SOLN: 0.2000 mL | Freq: Two times a day (BID) | OTIC | 0 refills | Status: DC

## 2019-09-15 MED ORDER — OFLOXACIN 0.3 % OT SOLN: 5.0000 [drp] | Freq: Every day | OTIC | 0 refills | Status: DC

## 2019-09-15 MED FILL — OFLOXACIN 0.3% EAR DROPS: 0.3 | 20 days supply | Qty: 5 | Fill #0

## 2019-09-15 NOTE — Telephone Encounter (Signed)
Pt states the ear drops were not covered by insurance and would like an alternative sent in. Pt also need a new psychiatry referral previous one expired due to pt refusal for appt but she is now requesting to go.

## 2019-09-15 NOTE — Telephone Encounter (Signed)
Seen by you, please address

## 2019-09-18 ENCOUNTER — Other Ambulatory Visit: Payer: Self-pay

## 2019-09-18 ENCOUNTER — Ambulatory Visit: Payer: Medicare HMO | Admitting: Physical Therapy

## 2019-09-18 ENCOUNTER — Encounter: Payer: Self-pay | Admitting: Physical Therapy

## 2019-09-18 DIAGNOSIS — R252 Cramp and spasm: Secondary | ICD-10-CM | POA: Diagnosis not present

## 2019-09-18 DIAGNOSIS — M6281 Muscle weakness (generalized): Secondary | ICD-10-CM | POA: Diagnosis not present

## 2019-09-18 DIAGNOSIS — R293 Abnormal posture: Secondary | ICD-10-CM | POA: Diagnosis not present

## 2019-09-18 DIAGNOSIS — M545 Low back pain: Secondary | ICD-10-CM | POA: Diagnosis not present

## 2019-09-18 DIAGNOSIS — R279 Unspecified lack of coordination: Secondary | ICD-10-CM

## 2019-09-18 DIAGNOSIS — M542 Cervicalgia: Secondary | ICD-10-CM | POA: Diagnosis not present

## 2019-09-18 NOTE — Therapy (Signed)
[facility] [address] [place], [place], [address] Phone: [phone]   Fax:  [phone]  Physical Therapy Treatment  Patient Details  Name: Margaret Fletcher MRN: [id number] Date of Birth: 07/08/1970 Referring Provider (PT): Gatha Mayer, MD   Encounter Date: 09/18/2019   PT End of Session - 09/18/19 1112    Visit Number 13    Date for PT Re-Evaluation 10/04/19    Authorization Type [employer]    PT Start Time 1110   late   PT Stop Time 1143    PT Time Calculation (min) 33 min           Past Medical History:  Diagnosis Date   Adie's pupil, left    Alopecia    Anxiety    Autoimmune disease ([place])    "Alopecia"   Bacterial vaginosis    Cyst of spleen    Depression    Disorder of oral soft tissue 03/17/2019   Frequent UTI    Hypertension    Hypertrophy of breast    IIH (idiopathic intracranial hypertension) 2017   "related to headaches"   Injury of jawline 03/17/2019   Migraine headache    denies, ruled out - Propranolol.Using for headaches   Non-suppurative otitis media 03/17/2019    Past Surgical History:  Procedure Laterality Date   ANAL RECTAL MANOMETRY N/A 06/02/2019   Procedure: ANO RECTAL MANOMETRY;  Surgeon: Gatha Mayer, MD;  [facility];  Service: Endoscopy;  Laterality: N/A;   APPENDECTOMY     BREAST EXCISIONAL BIOPSY Right 2017   removed at time of reduction   BREAST REDUCTION SURGERY Bilateral 03/26/2015   Procedure: BILATERAL BREAST REDUCTION  ;  Surgeon: Youlanda Roys, MD;  Location: [place];  Service: Plastics;  Laterality: Bilateral;   BREATH TEK H PYLORI N/A 05/21/2014   Procedure: BREATH TEK H PYLORI;  Surgeon: Greer Pickerel, MD;  Location: [facility];  Service: General;  Laterality: N/A;   CELIAC PLEXUS BLOCK  07/18/2018   Per patient it was done at [place] N/A 07/19/2012   Procedure: [place];  Surgeon: Farrel Gobble. Harrington Challenger, MD;  Location: [facility];  Service: Gynecology;  Laterality: N/A;   ENDOMETRIAL ABLATION     ESOPHAGOGASTRODUODENOSCOPY N/A 08/16/2015   Procedure: UPPER ESOPHAGOGASTRODUODENOSCOPY (EGD);  Surgeon: Greer Pickerel, MD;  Location: [facility];  Service: General;  Laterality: N/A;   ESOPHAGOGASTRODUODENOSCOPY N/A 02/21/2016   Procedure: ESOPHAGOGASTRODUODENOSCOPY (EGD);  Surgeon: Greer Pickerel, MD;  Location: [facility];  Service: General;  Laterality: N/A;   ESOPHAGOGASTRODUODENOSCOPY N/A 02/24/2017   Procedure: ESOPHAGOGASTRODUODENOSCOPY (EGD);  Surgeon: Greer Pickerel, MD;  Location: [facility];  Service: General;  Laterality: N/A;   IR GENERIC HISTORICAL  10/04/2015   IR [place] TUBE [place] Vivianne Master 10/04/2015 Markus Daft, MD [facility]   IR GENERIC HISTORICAL  01/31/2016   IR [place] TUBE PERCUT W/FLUORO 01/31/2016 Corrie Mckusick, DO [facility]   IR GENERIC HISTORICAL  04/06/2016   IR [place] GASTRO/COLONIC TUBE [place] W/FLUORO 04/06/2016 Arne Cleveland, MD [facility]   IR GENERIC HISTORICAL  05/14/2016   IR GASTRIC TUBE PERC CHG W/O IMG GUIDE 05/14/2016 Sandi Mariscal, MD [facility]   LAPAROSCOPIC GASTROSTOMY N/A 09/30/2015   Procedure: LAPAROSCOPIC GASTROSTOMY TUBE PLACEMENT;  Surgeon: Greer Pickerel, MD;  [facility];  Service: General;  Laterality: N/A;   LAPAROSCOPIC ROUX-EN-Y GASTRIC BYPASS WITH HIATAL HERNIA REPAIR N/A 07/08/2015   Procedure: LAPAROSCOPIC ROUX-EN-Y GASTRIC BYPASS WITH HIATAL HERNIA REPAIR WITH UPPER ENDOSCOPY;  Surgeon: Greer Pickerel, MD;  [facility];  Service: General;  Laterality: N/A;   LAPAROSCOPIC SMALL BOWEL RESECTION N/A 09/30/2015   Procedure: LAPAROSCOPIC REVISION OF ROUX LIMB;  Surgeon: Greer Pickerel, MD;  [facility];  Service: General;  Laterality: N/A;    LAPAROSCOPY N/A 09/30/2015   Procedure: LAPAROSCOPY DIAGNOSTIC;  Surgeon: Greer Pickerel, MD;  [facility];  Service: General;  Laterality: N/A;   REDUCTION MAMMAPLASTY Bilateral 2017   TUBAL LIGATION     WISDOM TOOTH EXTRACTION      There were no vitals filed for this visit.   Subjective Assessment - 09/18/19 1113    Subjective I am able to go a little bit but still need the enema.  I feel like I still don't have the same sensation to have a BM. I have been using the techniques    Patient Stated Goals decreased pain overall, able to have a BM    Currently in Pain? Yes    Pain Score 7     Pain Location Abdomen    Pain Orientation Mid;Upper    Pain Descriptors / Indicators Aching;Other (Comment)   nausea   Pain Type Chronic pain    Pain Onset More than a month ago    Pain Frequency Intermittent    Multiple Pain Sites No                             [facility] Adult PT Treatment/Exercise - 09/18/19 0001      Lumbar Exercises: Aerobic   Nustep L4 x 6 min with 30 sec intervals x 6 min      Lumbar Exercises: Machines for Strengthening   Leg Press 90 bilat x 20; 50 single leg x 20 each      Lumbar Exercises: Standing   Other Standing Lumbar Exercises step down 6" with kegel - needs bilat UE support      Lumbar Exercises: Seated   Sit to Stand Limitations demo sit to stand with kegel not performed yet                    PT Short Term Goals - 06/20/19 0935      PT SHORT TERM GOAL #1   Title Pt will be Ind c an initial HEP and toileting techniques    Baseline used toileting techniques successfully today    Status Achieved             PT Long Term Goals - 08/09/19 1612      PT LONG TERM GOAL #1   Title Pt will be Ind c a final HEP to improve bowel function    Baseline ind with current HEP; working on core strength and still progressing    Time 8    Period Weeks    Status Partially Met    Target Date 10/04/19      PT LONG TERM GOAL #2    Title Pt will demonstrate understanding of postures and activities which have positive and negative affects on her pain.    Baseline have done biofeedback to demonstrate stretches that reduce tone    Time 8    Period Weeks    Status On-going    Target Date 10/04/19  PT LONG TERM GOAL #3   Title Pt will report a pain range of 0-3/10 with functional activities such as eating and BM    Baseline still can be up to 10/10 with BM but able to use less or no enema at times    Time 8    Period Weeks    Status On-going    Target Date 10/04/19      PT LONG TERM GOAL #4   Title Pt will demonstrate pelvic floor strength of 3/5 and able to hold for at least 15 seconds for improved intra-abdominal pressure during activities such as walking    Baseline continues to have elevated tone and difficutly bulging, no able to work on strengthening yet    Status On-going      PT LONG TERM GOAL #5   Title Pt will be able to have BM and empty bladder with minimal to no bearing down for reduced stress on her GI tract.    Baseline reports this is improving now    Time 8    Period Weeks    Status On-going    Target Date 10/04/19                 Plan - 09/18/19 1133    Clinical Impression Statement Today's session focused on strengthening core and pelvic floor.  PT limited on time due to pt arriving late today, but was able to add 2 exercises for LE and coordinating with kegel at the same time.  Pt will benefit from skilled PT to continue working on strength and endurance.    PT Treatment/Interventions ADLs/Self Care Home Management;Electrical Stimulation;Iontophoresis '4mg'$ /ml Dexamethasone;Traction;Moist Heat;Ultrasound;Gait training;Functional mobility training;Balance training;Therapeutic exercise;Therapeutic activities;Patient/family education;Manual techniques;Passive range of motion;Dry needling;Taping;Energy conservation;Biofeedback;Cryotherapy;Neuromuscular re-education    PT Next Visit Plan f/u  on strength - added steps with kegel and sit to stand; continue with core and pelvic strength    PT Home Exercise Plan [id number]; added [id number] for pelvic floor    Consulted and Agree with Plan of Care Patient           Patient will benefit from skilled therapeutic intervention in order to improve the following deficits and impairments:  Improper body mechanics, Pain, Decreased mobility, Decreased activity tolerance, Decreased range of motion, Impaired UE functional use, Decreased balance, Decreased coordination, Increased fascial restricitons, Impaired tone, Decreased strength  Visit Diagnosis: Unspecified lack of coordination  Muscle weakness (generalized)  Cramp and spasm     Problem List Patient Active Problem List   Diagnosis Date Noted   Urinary frequency 06/20/2019   Urinary urgency 06/20/2019   Weakness of pelvic floor 06/20/2019   Constipation due to outlet dysfunction    Essential hypertension, benign 05/30/2019   TMJ pain dysfunction syndrome 04/10/2019   History of fall 04/10/2019   Spondylosis, cervical, with myelopathy 03/17/2019   Cervical disc disorder at C6-C7 level with radiculopathy 03/17/2019   Low back pain 03/17/2019   Non-suppurative otitis media 03/17/2019   Injury of jawline 03/17/2019   Disorder of oral soft tissue 03/17/2019   Reactive depression 06/13/2017   Chronic pain syndrome 06/13/2017   Polypharmacy 06/13/2017   Empty sella syndrome ([place]) 05/01/2017   Adjustment disorder with mixed anxiety and depressed mood 10/13/2016   Adie's tonic pupil, left 08/13/2016   Hypokalemia 02/22/2016   Constipation, chronic 02/22/2016   Memory loss due to medical condition 12/14/2015   Chronic fatigue 12/14/2015   Recurrent dehydration 09/30/2015   Chronic nausea 08/23/2015   Abdominal pain,  chronic, epigastric 08/20/2015   Protein-calorie malnutrition, moderate ([facility]) 08/19/2015   Mild obstructive sleep apnea 07/08/2015    Dyslipidemia 07/08/2015   S/P gastric bypass 07/08/2015   IIH (idiopathic intracranial hypertension) 06/03/2015   Vision changes 04/30/2015   Perceived hearing changes 04/30/2015   Chronic daily headache 04/21/2014   Liver lesion 03/21/2013   Splenic cyst 03/21/2013    Camillo Flaming Hubert Derstine, PT 09/18/2019, 11:48 AM  [facility] [address] [place], [place], [address] Phone: [phone]   Fax:  [phone]  Name: Kalayna Noy MRN: [id number] Date of Birth: 05/18/1970

## 2019-09-19 ENCOUNTER — Ambulatory Visit (INDEPENDENT_AMBULATORY_CARE_PROVIDER_SITE_OTHER): Payer: Medicare HMO | Admitting: Licensed Clinical Social Worker

## 2019-09-19 DIAGNOSIS — F411 Generalized anxiety disorder: Secondary | ICD-10-CM | POA: Diagnosis not present

## 2019-09-19 DIAGNOSIS — R5382 Chronic fatigue, unspecified: Secondary | ICD-10-CM | POA: Diagnosis not present

## 2019-09-19 DIAGNOSIS — F331 Major depressive disorder, recurrent, moderate: Secondary | ICD-10-CM
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
## 2019-09-19 NOTE — Telephone Encounter (Signed)
Received appeal decision fax. Per [facility], even though patient has tried and failed rizatriptan and sumatriptan, they are still requiring her to try naratriptan before they will cover the Relpax.   Amy, please advise. Thanks!

## 2019-09-25 ENCOUNTER — Telehealth (INDEPENDENT_AMBULATORY_CARE_PROVIDER_SITE_OTHER): Payer: Medicare HMO | Admitting: Psychiatry

## 2019-09-25 ENCOUNTER — Encounter (HOSPITAL_COMMUNITY): Payer: Self-pay | Admitting: Psychiatry

## 2019-09-25 DIAGNOSIS — F331 Major depressive disorder, recurrent, moderate: Secondary | ICD-10-CM

## 2019-09-25 DIAGNOSIS — F411 Generalized anxiety disorder: Secondary | ICD-10-CM | POA: Diagnosis not present

## 2019-09-25 MED ORDER — NARATRIPTAN HCL 2.5 MG PO TABS: 2.5000 mg | ORAL_TABLET | ORAL | 0 refills | Status: AC | PRN

## 2019-09-25 MED ORDER — BUSPIRONE HCL 10 MG PO TABS: 10.0000 mg | ORAL_TABLET | Freq: Two times a day (BID) | ORAL | 0 refills | Status: DC

## 2019-09-25 MED FILL — busPIRone HCL 10 MG TABS: 10 | 30 days supply | Qty: 60 | Fill #0

## 2019-09-25 MED FILL — NARATRIPTAN HCL 2.5 MG TAB: 2.5 | 27 days supply | Qty: 9 | Fill #0

## 2019-09-25 NOTE — Telephone Encounter (Signed)
Called and spoke with patient. She is aware of the medication change and that it has been sent to the pharmacy for her. I advised her that if this medication does not work, to let us know. She verbalized understanding.   Nothing further needed at time of call.
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
## 2019-09-26 ENCOUNTER — Ambulatory Visit: Payer: Medicare HMO | Admitting: Physical Therapy

## 2019-09-26 ENCOUNTER — Other Ambulatory Visit: Payer: Self-pay

## 2019-09-26 DIAGNOSIS — M545 Low back pain: Secondary | ICD-10-CM | POA: Diagnosis not present

## 2019-09-26 DIAGNOSIS — R279 Unspecified lack of coordination: Secondary | ICD-10-CM

## 2019-09-26 DIAGNOSIS — M6281 Muscle weakness (generalized): Secondary | ICD-10-CM | POA: Diagnosis not present

## 2019-09-26 DIAGNOSIS — R252 Cramp and spasm: Secondary | ICD-10-CM

## 2019-09-26 DIAGNOSIS — M542 Cervicalgia: Secondary | ICD-10-CM | POA: Diagnosis not present

## 2019-09-26 DIAGNOSIS — R293 Abnormal posture: Secondary | ICD-10-CM | POA: Diagnosis not present

## 2019-09-26 NOTE — Therapy (Signed)
[facility] [address], [place], [place], [address] Phone: [phone]   Fax:  [phone]  Physical Therapy Treatment  Patient Details  Name: Margaret Fletcher MRN: [id number] Date of Birth: 1970-11-11 Referring Provider (PT): Gatha Mayer, MD   Encounter Date: 09/26/2019   PT End of Session - 09/26/19 1020    Visit Number 14    Date for PT Re-Evaluation 10/04/19    Authorization Type [employer]    PT Start Time 0940    PT Stop Time 1027    PT Time Calculation (min) 47 min    Activity Tolerance Patient tolerated treatment well    Behavior During Therapy [facility] for tasks assessed/performed           Past Medical History:  Diagnosis Date  . Adie's pupil, left   . Alopecia   . Anxiety   . Autoimmune disease ([place])    "Alopecia"  . Bacterial vaginosis   . Cyst of spleen   . Depression   . Disorder of oral soft tissue 03/17/2019  . Frequent UTI   . Hypertension   . Hypertrophy of breast   . IIH (idiopathic intracranial hypertension) 2017   "related to headaches"  . Injury of jawline 03/17/2019  . Migraine headache    denies, ruled out - Propranolol.Using for headaches  . Non-suppurative otitis media 03/17/2019    Past Surgical History:  Procedure Laterality Date  . ANAL RECTAL MANOMETRY N/A 06/02/2019   Procedure: ANO RECTAL MANOMETRY;  Surgeon: Gatha Mayer, MD;  Location: [facility];  Service: Endoscopy;  Laterality: N/A;  . APPENDECTOMY    . BREAST EXCISIONAL BIOPSY Right 2017   removed at time of reduction  . BREAST REDUCTION SURGERY Bilateral 03/26/2015   Procedure: BILATERAL BREAST REDUCTION  ;  Surgeon: Youlanda Roys, MD;  Location: [place];  Service: Plastics;  Laterality: Bilateral;  . BREATH TEK H PYLORI N/A 05/21/2014   Procedure: BREATH TEK H PYLORI;  Surgeon: Greer Pickerel, MD;  Location: [facility];  Service: General;  Laterality: N/A;  . CELIAC  PLEXUS BLOCK  07/18/2018   Per patient it was done at [place]    . CESAREAN SECTION    . CHOLECYSTECTOMY    . DILITATION & CURRETTAGE/HYSTROSCOPY WITH NOVASURE ABLATION N/A 07/19/2012   Procedure: HYSTEROSCOPY WITH NOVASURE ABLATION;  Surgeon: Farrel Gobble. Harrington Challenger, MD;  Location: [facility];  Service: Gynecology;  Laterality: N/A;  . ENDOMETRIAL ABLATION    . ESOPHAGOGASTRODUODENOSCOPY N/A 08/16/2015   Procedure: UPPER ESOPHAGOGASTRODUODENOSCOPY (EGD);  Surgeon: Greer Pickerel, MD;  Location: [facility];  Service: General;  Laterality: N/A;  . ESOPHAGOGASTRODUODENOSCOPY N/A 02/21/2016   Procedure: ESOPHAGOGASTRODUODENOSCOPY (EGD);  Surgeon: Greer Pickerel, MD;  Location: [facility];  Service: General;  Laterality: N/A;  . ESOPHAGOGASTRODUODENOSCOPY N/A 02/24/2017   Procedure: ESOPHAGOGASTRODUODENOSCOPY (EGD);  Surgeon: Greer Pickerel, MD;  Location: [facility];  Service: General;  Laterality: N/A;  . IR GENERIC HISTORICAL  10/04/2015   IR [place] Vivianne Master 10/04/2015 Markus Daft, MD [facility]  . IR GENERIC HISTORICAL  01/31/2016   IR [place] GASTRO/COLONIC TUBE PERCUT W/FLUORO 01/31/2016 Corrie Mckusick, DO [facility]  . IR GENERIC HISTORICAL  04/06/2016   IR [place] TUBE PERCUT W/FLUORO 04/06/2016 Arne Cleveland, MD [facility]  . IR GENERIC HISTORICAL  05/14/2016   IR GASTRIC TUBE PERC CHG W/O IMG GUIDE 05/14/2016 Sandi Mariscal, MD [facility]  . LAPAROSCOPIC GASTROSTOMY  N/A 09/30/2015   Procedure: LAPAROSCOPIC GASTROSTOMY TUBE PLACEMENT;  Surgeon: Greer Pickerel, MD;  Location: [facility];  Service: General;  Laterality: N/A;  . LAPAROSCOPIC ROUX-EN-Y GASTRIC BYPASS WITH HIATAL HERNIA REPAIR N/A 07/08/2015   Procedure: LAPAROSCOPIC ROUX-EN-Y GASTRIC BYPASS WITH HIATAL HERNIA REPAIR WITH UPPER ENDOSCOPY;  Surgeon: Greer Pickerel, MD;  Location: [facility];  Service: General;  Laterality: N/A;  . LAPAROSCOPIC SMALL BOWEL RESECTION N/A 09/30/2015   Procedure:  LAPAROSCOPIC REVISION OF ROUX LIMB;  Surgeon: Greer Pickerel, MD;  Location: [facility];  Service: General;  Laterality: N/A;  . LAPAROSCOPY N/A 09/30/2015   Procedure: LAPAROSCOPY DIAGNOSTIC;  Surgeon: Greer Pickerel, MD;  Location: [facility];  Service: General;  Laterality: N/A;  . REDUCTION MAMMAPLASTY Bilateral 2017  . TUBAL LIGATION    . WISDOM TOOTH EXTRACTION      There were no vitals filed for this visit.   Subjective Assessment - 09/26/19 1050    Subjective I am feeling like food isn't moving again and pain the upper abdomen.  I had an entire bottle of mirilax this morning and that didn't help    Patient Stated Goals decreased pain overall, able to have a BM    Currently in Pain? Yes    Pain Score 7     Pain Location Abdomen    Pain Orientation Upper;Mid    Pain Descriptors / Indicators Aching    Multiple Pain Sites No                             [facility] Adult PT Treatment/Exercise - 09/26/19 0001      Lumbar Exercises: Aerobic   Nustep L4 x 8 min with 30 sec intervals x 6 min      Manual Therapy   Myofascial Release colon and rectal fascial release; abdomen had good release going superior to transverse and diagonal from LRQ to ULQ                    PT Short Term Goals - 06/20/19 0935      PT SHORT TERM GOAL #1   Title Pt will be Ind c an initial HEP and toileting techniques    Baseline used toileting techniques successfully today    Status Achieved             PT Long Term Goals - 08/09/19 1612      PT LONG TERM GOAL #1   Title Pt will be Ind c a final HEP to improve bowel function    Baseline ind with current HEP; working on core strength and still progressing    Time 8    Period Weeks    Status Partially Met    Target Date 10/04/19      PT LONG TERM GOAL #2   Title Pt will demonstrate understanding of postures and activities which have positive and negative affects on her pain.    Baseline have done biofeedback to demonstrate stretches  that reduce tone    Time 8    Period Weeks    Status On-going    Target Date 10/04/19      PT LONG TERM GOAL #3   Title Pt will report a pain range of 0-3/10 with functional activities such as eating and BM    Baseline still can be up to 10/10 with BM but able to use less or no enema at times    Time 8  Period Weeks    Status On-going    Target Date 10/04/19      PT LONG TERM GOAL #4   Title Pt will demonstrate pelvic floor strength of 3/5 and able to hold for at least 15 seconds for improved intra-abdominal pressure during activities such as walking    Baseline continues to have elevated tone and difficutly bulging, no able to work on strengthening yet    Status On-going      PT LONG TERM GOAL #5   Title Pt will be able to have BM and empty bladder with minimal to no bearing down for reduced stress on her GI tract.    Baseline reports this is improving now    Time 8    Period Weeks    Status On-going    Target Date 10/04/19                 Plan - 09/26/19 1040    Clinical Impression Statement Today's session focused more on pain management with fascial release to abdomen.  Fascia was restricted throughout and took 15 minutes to warm up then began to get a good release with fascia moving twards the areas of pain and restriction. Pt will benefit from skilled PT to continue working on fascial release for improved bowel movement and to return to progression of LE and core strength as able.    PT Treatment/Interventions ADLs/Self Care Home Management;Electrical Stimulation;Iontophoresis 106m/ml Dexamethasone;Traction;Moist Heat;Ultrasound;Gait training;Functional mobility training;Balance training;Therapeutic exercise;Therapeutic activities;Patient/family education;Manual techniques;Passive range of motion;Dry needling;Taping;Energy conservation;Biofeedback;Cryotherapy;Neuromuscular re-education    PT Next Visit Plan f/u on effects from fascial release; cont strength - added steps  with kegel and sit to stand; continue with core and pelvic strength    PT Home Exercise Plan [id number]; added [id number] for pelvic floor    Consulted and Agree with Plan of Care Patient           Patient will benefit from skilled therapeutic intervention in order to improve the following deficits and impairments:  Improper body mechanics, Pain, Decreased mobility, Decreased activity tolerance, Decreased range of motion, Impaired UE functional use, Decreased balance, Decreased coordination, Increased fascial restricitons, Impaired tone, Decreased strength  Visit Diagnosis: Unspecified lack of coordination  Muscle weakness (generalized)  Cramp and spasm     Problem List Patient Active Problem List   Diagnosis Date Noted  . Urinary frequency 06/20/2019  . Urinary urgency 06/20/2019  . Weakness of pelvic floor 06/20/2019  . Constipation due to outlet dysfunction   . Essential hypertension, benign 05/30/2019  . 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 History of fall 04/10/2019  . Spondylosis, cervical, with myelopathy 03/17/2019  . 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 Low back pain 03/17/2019  . Non-suppurative otitis media 03/17/2019  . Injury of jawline 03/17/2019  . Disorder of oral soft tissue 03/17/2019  . Reactive depression 06/13/2017  . Chronic pain syndrome 06/13/2017  . Polypharmacy 06/13/2017  . Empty sella syndrome ([place] 05/01/2017  . Adjustment disorder with mixed anxiety and depressed mood 10/13/2016  . 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 Hypokalemia 02/22/2016  . Constipation, chronic 02/22/2016  . Memory loss due to medical condition 12/14/2015  . Chronic fatigue 12/14/2015  . Recurrent dehydration 09/30/2015  . Chronic nausea 08/23/2015  . Abdominal pain, chronic, epigastric 08/20/2015  . Protein[place]calorie malnutrition, moderate [place] 08/19/2015  . Mild obstructive sleep apnea 07/08/2015  . Dyslipidemia 07/08/2015  .  S/P gastric bypass 07/08/2015  . IIH (  idiopathic intracranial hypertension) 06/03/2015  . Vision changes 04/30/2015  . Perceived hearing changes 04/30/2015  . Chronic daily headache 04/21/2014  . Liver lesion 03/21/2013  . Splenic cyst 03/21/2013    Jule Ser, PT 09/26/2019, 10:53 AM  [facility] [address], [place], [place], [address] Phone: [phone]   Fax:  [phone]  Name: Margaret Fletcher MRN: [id number] Date of Birth: [birth year]

## 2019-09-29 ENCOUNTER — Other Ambulatory Visit: Payer: Self-pay

## 2019-09-29 ENCOUNTER — Encounter: Payer: Self-pay | Admitting: Family Medicine

## 2019-09-29 ENCOUNTER — Ambulatory Visit (INDEPENDENT_AMBULATORY_CARE_PROVIDER_SITE_OTHER): Payer: Medicare HMO | Admitting: Family Medicine

## 2019-09-29 VITALS — BP 114/81 | HR 83 | Temp 97.7°F | Ht 68.0 in | Wt 182.0 lb

## 2019-09-29 DIAGNOSIS — G2581 Restless legs syndrome: Secondary | ICD-10-CM

## 2019-09-29 DIAGNOSIS — I10 Essential (primary) hypertension: Secondary | ICD-10-CM

## 2019-09-29 DIAGNOSIS — Z1159 Encounter for screening for other viral diseases: Secondary | ICD-10-CM | POA: Diagnosis not present

## 2019-09-29 DIAGNOSIS — Z9884 Bariatric surgery status: Secondary | ICD-10-CM | POA: Diagnosis not present

## 2019-09-29 DIAGNOSIS — E785 Hyperlipidemia, unspecified: Secondary | ICD-10-CM | POA: Diagnosis not present

## 2019-09-29 DIAGNOSIS — Z23 Encounter for immunization: Secondary | ICD-10-CM

## 2019-09-29 MED ORDER — PRAMIPEXOLE DIHYDROCHLORIDE 0.125 MG PO TABS: 0.1250 mg | ORAL_TABLET | Freq: Every day | ORAL | 4 refills | Status: DC

## 2019-09-29 MED FILL — PRAMIPEXOLE 0.125 MG TABLET: 0.125 | 30 days supply | Qty: 60 | Fill #0

## 2019-09-29 NOTE — Progress Notes (Signed)
7/23/202111:39 AM  Margaret Fletcher Jul 04, 1970, 49 y.o., female [id number]  Chief Complaint  Patient presents with  . Follow-up    chronic medical conditions   . Hypertension    takes meds daily   . not sleeping well    high anxiety levels , has talked to psych   . restless legs at night    woken by jittery feeling legs, abd, and arms    HPI:   Patient is a 49 y.o. female with past medical history significant for migraine,idiopathicintracranial hypertension,OSA, constipation, empty sella syndrome, G tube feeding, s/p gastric bypass who presents today for routine followup  Last OV march 2021 - no changes  Doing so so  Recently treated with a UTI Working with her psychiatrist, last OV a week ago, changed buspar to '10mg'$  BID Having recent formulary changes Walking more, going to PT At night her legs hurt/ache/jittery - moving makes it better She does not remember every been told she had RLS at time of sleep study, she does not use cpap She takes a multivitamin sp for s/p bariatric surgery Struggles with dietary iron sources intake due to intolerance  Depression screen [facility] 2/9 09/13/2019 08/10/2019 06/20/2019  Decreased Interest 0 2 0  Down, Depressed, Hopeless 0 - 0  PHQ - 2 Score 0 2 0  Altered sleeping - 3 0  Tired, decreased energy - 3 0  Change in appetite - 2 0  Feeling bad or failure about yourself  - 0 0  Trouble concentrating - 0 0  Moving slowly or fidgety/restless - 0 0  Suicidal thoughts - 0 0  PHQ-9 Score - 10 0  Difficult doing work/chores - Somewhat difficult Not difficult at all  Some recent data might be hidden    Fall Risk  09/29/2019 09/13/2019 08/10/2019 06/20/2019 05/30/2019  Falls in the past year? 0 0 1 0 0  Number falls in past yr: 0 0 1 0 0  Comment - - swelling - -  Injury with Fall? 0 0 1 0 0  Comment - - - - -  Follow up Falls evaluation completed Falls evaluation completed Falls evaluation completed;Education provided Falls evaluation  completed Falls evaluation completed     Allergies  Allergen Reactions  . Sulfa Antibiotics Anaphylaxis  . Metoclopramide Other (See Comments)    Elevated prolactin >5x ULN induction lactation when taking scheduled for several months  . Zosyn [Piperacillin Sod-Tazobactam So] Rash    Has patient had a PCN reaction causing immediate rash, facial/tongue/throat swelling, SOB or lightheadedness with hypotension: No Has patient had a PCN reaction causing severe rash involving mucus membranes or skin necrosis: No Has patient had a PCN reaction that required hospitalization: Unknown--inpatient when reaction occurred Has patient had a PCN reaction occurring within the last 10 years: Yes If all of the above answers are "NO", then may proceed with Cephalosporin use.     Prior to Admission medications   Medication Sig Start Date End Date Taking? Authorizing Provider  albuterol (PROVENTIL HFA;VENTOLIN HFA) 108 (90 Base) MCG/ACT inhaler Inhale 2 puffs into the lungs every 6 (six) hours as needed for wheezing or shortness of breath. 01/25/18  Yes Shawnee Knapp, MD  amitriptyline (ELAVIL) 10 MG tablet TAKE 2 TABLETS BY MOUTH AT BEDTIME. 07/25/19  Yes Rutherford Guys, MD  azelastine (ASTELIN) 0.1 % nasal spray Place 1 spray into both nostrils 2 (two) times daily. Use in each nostril as directed 04/18/19  Yes Maximiano Coss, NP  Pleas Patricia  MICROLET LANCETS lancets Use as instructed 06/13/17  Yes Shawnee Knapp, MD  BELBUCA 600 MCG FILM Take 1 strip by mouth 2 (two) times daily. 04/17/19  Yes [provider]  buPROPion (WELLBUTRIN SR) 150 MG 12 hr tablet Take 1 tablet (150 mg total) by mouth daily. 07/31/19  Yes Merian Capron, MD  busPIRone (BUSPAR) 10 MG tablet Take 1 tablet (10 mg total) by mouth 2 (two) times daily. 09/25/19  Yes Merian Capron, MD  CALCIUM-VITAMIN D PO Take 1 tablet by mouth 2 (two) times daily. chewable   Yes [provider]  chlorhexidine (PERIDEX) 0.12 % solution Use as directed 15  mLs in the mouth or throat 2 (two) times daily. 03/17/19  Yes Wendall Mola, NP  Cyanocobalamin (VITAMIN B-12 SL) Place 1 drop under the tongue daily. 1 dropperful   Yes [provider]  cyclobenzaprine (FLEXERIL) 5 MG tablet TAKE 1 TABLET BY MOUTH 3 TIMES DAILY AS NEEDED FOR MUSCLE SPASMS. MAY TAKE 2 TABS BY MOUTH AT BEDTIME 06/17/18  Yes Rutherford Guys, MD  docusate sodium (COLACE) 100 MG capsule Take 100 mg by mouth daily as needed.    Yes [provider]  eletriptan (RELPAX) 40 MG tablet Take 1 tablet (40 mg total) by mouth every 2 (two) hours as needed for migraine or headache. Do not use >2 doses/24 hours 08/29/19  Yes Lomax, Amy, NP  famotidine (PEPCID) 20 MG tablet Take 1 tablet (20 mg total) by mouth 2 (two) times daily. 02/28/19  Yes Rutherford Guys, MD  fluticasone [facility]) 50 MCG/ACT nasal spray Place 2 sprays into both nostrils daily. Patient taking differently: Place 2 sprays into both nostrils daily as needed for allergies.  04/18/19  Yes Maximiano Coss, NP  Galcanezumab-gnlm [facility]) 120 MG/ML SOAJ Inject 240 mg into the skin every 30 (thirty) days. 08/29/19  Yes Lomax, Amy, NP  glucose blood (CONTOUR NEXT TEST) test strip 1 each by Other route 4 (four) times daily as needed (hypoglycemia). K91.2 06/10/17  Yes Shawnee Knapp, MD  hydrocortisone (ANUSOL-HC) 25 MG suppository Place 1 suppository (25 mg total) rectally at bedtime. Use for 5 nights 03/23/19  Yes Noralyn Pick, NP  Multiple Vitamins-Minerals (BARIATRIC MULTIVITAMINS/IRON PO) Take 1 tablet by mouth 3 (three) times daily.   Yes [provider]  naloxegol oxalate (MOVANTIK) 25 MG TABS tablet Take 1 tablet (25 mg total) by mouth daily. 04/19/19  Yes Gatha Mayer, MD  naratriptan (AMERGE) 2.5 MG tablet Take 1 tablet (2.5 mg total) by mouth as needed for migraine. Take one (1) tablet at onset of headache; if returns or does not resolve, may repeat after 4 hours; do not exceed five (5) mg in  24 hours. 09/25/19  Yes Lomax, Amy, NP  Omega-3 Fatty Acids (OMEGA-3 FISH OIL PO) Take by mouth daily.   Yes [provider]  omeprazole (PRILOSEC) 20 MG capsule Take 1 capsule (20 mg total) by mouth every morning. 03/23/19  Yes Noralyn Pick, NP  ondansetron (ZOFRAN ODT) 4 MG disintegrating tablet Take 1 tablet (4 mg total) by mouth every 8 (eight) hours as needed for nausea or vomiting. 06/13/19  Yes Gatha Mayer, MD  ondansetron (ZOFRAN ODT) 8 MG disintegrating tablet '8mg'$  ODT q8 hours prn nausea 06/21/19  Yes Palumbo, April, MD  OVER THE COUNTER MEDICATION Meca root   Yes [provider]  OVER THE COUNTER MEDICATION Calcium, magnesium, zinc with D3 (one tablet daily)   Yes [provider]  OVER THE COUNTER MEDICATION Collagen and vitamin c and biotin (takes one daily)   Yes [provider]  OVER THE COUNTER MEDICATION Tunic for muscle and joint health   Yes [provider]  polyethylene glycol (MIRALAX / GLYCOLAX) packet Take 17 g by mouth daily as needed (for constipation.).    Yes [provider]  potassium chloride (KLOR-CON) 10 MEQ tablet Take 1 tablet (10 mEq total) by mouth 3 (three) times daily. 06/30/19  Yes Myles Lipps, MD  PREMARIN vaginal cream Place 1 Applicatorful vaginally 2 (two) times a week.  11/28/18  Yes [provider]  Prucalopride Succinate (MOTEGRITY) 2 MG TABS Take 1 tablet (2 mg total) by mouth daily. 05/23/19  Yes Iva Boop, MD  simethicone (MYLICON) 80 MG chewable tablet Chew 80 mg by mouth every 6 (six) hours as needed for flatulence.   Yes [provider]  topiramate (TOPAMAX) 100 MG tablet Take 1.5 tablets (150 mg total) by mouth daily. 08/29/19  Yes Lomax, Amy, NP  triamterene-hydrochlorothiazide (MAXZIDE-25) 37.5-25 MG tablet Take 1 tablet by mouth daily. 05/30/19  Yes Myles Lipps, MD  ciprofloxacin-hydrocortisone (CIPRO HC OTIC) OTIC suspension Place 3 drops into the left  ear 2 (two) times daily. Patient not taking: Reported on 09/29/2019 09/13/19   Janeece Agee, NP  ofloxacin (FLOXIN OTIC) 0.3 % OTIC solution Place 5 drops into the left ear daily. Patient not taking: Reported on 09/29/2019 09/15/19   Janeece Agee, NP  desvenlafaxine (PRISTIQ) 100 MG 24 hr tablet TAKE 1 TABLET BY MOUTH ONCE A DAY Patient taking differently: Take 100 mg by mouth daily.  03/20/19 07/31/19  Myles Lipps, MD    Past Medical History:  Diagnosis Date  . Adie's pupil, left   . Alopecia   . Anxiety   . Autoimmune disease (HCC)    "Alopecia"  . Bacterial vaginosis   . Cyst of spleen   . Depression   . Disorder of oral soft tissue 03/17/2019  . Frequent UTI   . Hypertension   . Hypertrophy of breast   . IIH (idiopathic intracranial hypertension) 2017   "related to headaches"  . Injury of jawline 03/17/2019  . Migraine headache    denies, ruled out - Propranolol.Using for headaches  . Non-suppurative otitis media 03/17/2019    Past Surgical History:  Procedure Laterality Date  . ANAL RECTAL MANOMETRY N/A 06/02/2019   Procedure: ANO RECTAL MANOMETRY;  Surgeon: Iva Boop, MD;  Location: [facility];  Service: Endoscopy;  Laterality: N/A;  . APPENDECTOMY    . BREAST EXCISIONAL BIOPSY Right 2017   removed at time of reduction  . BREAST REDUCTION SURGERY Bilateral 03/26/2015   Procedure: BILATERAL BREAST REDUCTION  ;  Surgeon: Eloise Levels, MD;  Location: [facility];  Service: Plastics;  Laterality: Bilateral;  . BREATH TEK H PYLORI N/A 05/21/2014   Procedure: BREATH TEK H PYLORI;  Surgeon: Gaynelle Adu, MD;  Location: [facility];  Service: General;  Laterality: N/A;  . CELIAC PLEXUS BLOCK  07/18/2018   Per patient [facility] was [facility] at [facility]  . CESAREAN SECTION    . CESAREAN SECTION    . CHOLECYSTECTOMY    . DILITATION & CURRETTAGE/HYSTROSCOPY WITH NOVASURE ABLATION N/A 07/19/2012   Procedure: HYSTEROSCOPY WITH NOVASURE ABLATION;  Surgeon: Freddrick March. Tenny Craw, MD;  Location: [facility];  Service: Gynecology;  Laterality: N/A;  . ENDOMETRIAL ABLATION    . ESOPHAGOGASTRODUODENOSCOPY N/A 08/16/2015   Procedure:  UPPER ESOPHAGOGASTRODUODENOSCOPY (EGD);  Surgeon: Gaynelle Adu, MD;  Location: [facility];  Service: General;  Laterality: N/A;  . ESOPHAGOGASTRODUODENOSCOPY N/A 02/21/2016   Procedure: ESOPHAGOGASTRODUODENOSCOPY (EGD);  Surgeon: Gaynelle Adu, MD;  Location: [facility];  Service: General;  Laterality: N/A;  . ESOPHAGOGASTRODUODENOSCOPY N/A 02/24/2017   Procedure: ESOPHAGOGASTRODUODENOSCOPY (EGD);  Surgeon: Gaynelle Adu, MD;  Location: [facility];  Service: General;  Laterality: N/A;  . IR GENERIC HISTORICAL  10/04/2015   IR REPLC GASTRO/COLONIC TUBE Cristi Loron [name] 10/04/2015 Richarda Overlie, MD [facility]  . IR GENERIC HISTORICAL  01/31/2016   IR REPLC GASTRO/COLONIC TUBE PERCUT W/FLUORO 01/31/2016 Gilmer Mor, DO [facility]  . IR GENERIC HISTORICAL  04/06/2016   IR REPLC GASTRO/COLONIC TUBE PERCUT W/FLUORO 04/06/2016 Oley Balm, MD [facility]  . IR GENERIC HISTORICAL  05/14/2016   IR GASTRIC TUBE PERC CHG W/O IMG GUIDE 05/14/2016 Simonne Come, MD [facility]  . LAPAROSCOPIC GASTROSTOMY N/A 09/30/2015   Procedure: LAPAROSCOPIC GASTROSTOMY TUBE PLACEMENT;  Surgeon: Gaynelle Adu, MD;  Location: [facility];  Service: General;  Laterality: N/A;  . LAPAROSCOPIC ROUX-EN-Y GASTRIC BYPASS WITH HIATAL HERNIA REPAIR N/A 07/08/2015   Procedure: LAPAROSCOPIC ROUX-EN-Y GASTRIC BYPASS WITH HIATAL HERNIA REPAIR WITH UPPER ENDOSCOPY;  Surgeon: Gaynelle Adu, MD;  Location: [facility];  Service: General;  Laterality: N/A;  . LAPAROSCOPIC SMALL BOWEL RESECTION N/A 09/30/2015   Procedure: LAPAROSCOPIC REVISION OF ROUX LIMB;  Surgeon: Gaynelle Adu, MD;  Location: [facility];  Service: General;  Laterality: N/A;  . LAPAROSCOPY N/A 09/30/2015   Procedure: LAPAROSCOPY DIAGNOSTIC;  Surgeon: Gaynelle Adu, MD;  Location: [facility];  Service: General;  Laterality: N/A;  . REDUCTION  MAMMAPLASTY Bilateral 2017  . TUBAL LIGATION    . WISDOM TOOTH EXTRACTION      Social History   Tobacco Use  . Smoking status: Never Smoker  . Smokeless tobacco: Never Used  Substance Use Topics  . Alcohol use: Not Currently    Family History  Problem Relation Age of Onset  . Cancer Mother        late 69'-50  . Breast cancer Mother        late 54'-50  . Stroke Father   . Colon polyps Father   . Diabetes Brother   . Seizures Son   . Breast cancer Maternal Aunt        late 40'-50  . Breast cancer Maternal Aunt        late 40'-50  . Breast cancer Maternal Aunt        late 40'-50  . Migraines Neg Hx     Review of Systems  Constitutional: Negative for chills and fever.  Respiratory: Negative for cough and shortness of breath.   Cardiovascular: Negative for chest pain, palpitations and leg swelling.   Per hpi  OBJECTIVE:  Today's Vitals   09/29/19 1059  BP: 114/81  Pulse: 83  Temp: 97.7 F (36.5 C)  SpO2: 100%  Weight: 182 lb (82.6 kg)  Height: 5\' 8"  (1.727 m)   Body mass index is 27.67 kg/m.   Physical Exam Vitals and nursing note reviewed.  Constitutional:      Appearance: She is well-developed.  HENT:     Head: Normocephalic and atraumatic.     Mouth/Throat:     Pharynx: No oropharyngeal exudate.  Eyes:     General: No scleral icterus.    Conjunctiva/sclera: Conjunctivae normal.     Pupils: Pupils are equal, round, and reactive to light.  Cardiovascular:     Rate  and Rhythm: Normal rate and regular rhythm.     Heart sounds: Normal heart sounds. No murmur heard.  No friction rub. No gallop.   Pulmonary:     Effort: Pulmonary effort is normal.     Breath sounds: Normal breath sounds. No wheezing or rales.  Musculoskeletal:     Cervical back: Neck supple.  Skin:    General: Skin is warm and dry.  Neurological:     Mental Status: She is alert and oriented to person, place, and time.     No results found for this or any previous visit (from  the past 24 hour(s)).  No results found.   ASSESSMENT and PLAN  1. RLS (restless legs syndrome) Discussed treatment options. Trial of mirapex, ferritin goal > 70  2. Essential hypertension, benign Controlled. Continue current regime.   3. Dyslipidemia Checking labs today, medications will be adjusted as needed.  - CMP14+EGFR - Lipid panel  4. S/P gastric bypass - VITAMIN D 25 Hydroxy (Vit-D Deficiency, Fractures) - Vitamin B12 - Ferritin  5. Encounter for hepatitis C screening test for low risk patient - Hepatitis C antibody  Other orders - Tdap vaccine greater than or equal to 7yo IM - pramipexole (MIRAPEX) 0.125 MG tablet; Take 1-2 tablets (0.125-0.25 mg total) by mouth at bedtime. Take 2-3 hours before bedtime.  Return in about 4 weeks (around 10/27/2019).    Rutherford Guys, MD [facility] at [facility] [place],  [id number] Ph.  [phone] Fax [phone]

## 2019-09-29 NOTE — Patient Instructions (Signed)
° ° ° °  If you have lab work done today you will be contacted with your lab results within the next 2 weeks.  If you have not heard from us then please contact us. The fastest way to get your results is to register for My Chart. ° ° °IF you received an x-ray today, you will receive an invoice from [facility]. Please contact [facility] at [phone] with questions or concerns regarding your invoice.  ° °IF you received labwork today, you will receive an invoice from [facility]. Please contact [facility] at [phone] with questions or concerns regarding your invoice.  ° °Our billing staff will not be able to assist you with questions regarding bills from these companies. ° °You will be contacted with the lab results as soon as they are available. The fastest way to get your results is to activate your My Chart account. Instructions are located on the last page of this paperwork. If you have not heard from us regarding the results in 2 weeks, please contact this office. °  ° ° ° °

## 2019-09-30 ENCOUNTER — Encounter: Payer: Self-pay | Admitting: Family Medicine

## 2019-09-30 LAB — CMP14+EGFR
ALT: 16 IU/L (ref 0–32)
AST: 25 IU/L (ref 0–40)
Albumin/Globulin Ratio: 1.6 (ref 1.2–2.2)
Albumin: 4.7 g/dL (ref 3.8–4.8)
Alkaline Phosphatase: 117 IU/L (ref 48–121)
BUN/Creatinine Ratio: 17 (ref 9–23)
BUN: 27 mg/dL — ABNORMAL HIGH (ref 6–24)
Bilirubin Total: 0.2 mg/dL (ref 0.0–1.2)
CO2: 20 mmol/L (ref 20–29)
Calcium: 9 mg/dL (ref 8.7–10.2)
Chloride: 98 mmol/L (ref 96–106)
Creatinine, Ser: 1.62 mg/dL — ABNORMAL HIGH (ref 0.57–1.00)
GFR calc Af Amer: 43 mL/min/{1.73_m2} — ABNORMAL LOW (ref >59)
GFR calc non Af Amer: 37 mL/min/{1.73_m2} — ABNORMAL LOW (ref >59)
Globulin, Total: 3 g/dL (ref 1.5–4.5)
Glucose: 90 mg/dL (ref 65–99)
Potassium: 4.4 mmol/L (ref 3.5–5.2)
Sodium: 135 mmol/L (ref 134–144)
Total Protein: 7.7 g/dL (ref 6.0–8.5)

## 2019-09-30 LAB — LIPID PANEL
Chol/HDL Ratio: 3.2 ratio (ref 0.0–4.4)
Cholesterol, Total: 221 mg/dL — ABNORMAL HIGH (ref 100–199)
HDL: 70 mg/dL (ref >39)
LDL Chol Calc (NIH): 139 mg/dL — ABNORMAL HIGH (ref 0–99)
Triglycerides: 70 mg/dL (ref 0–149)
VLDL Cholesterol Cal: 12 mg/dL (ref 5–40)

## 2019-09-30 LAB — HEPATITIS C ANTIBODY: Hep C Virus Ab: 0.2 s/co ratio (ref 0.0–0.9)

## 2019-09-30 LAB — VITAMIN B12: Vitamin B-12: 1265 pg/mL — ABNORMAL HIGH (ref 232–1245)

## 2019-09-30 LAB — FERRITIN: Ferritin: 43 ng/mL (ref 15–150)

## 2019-09-30 LAB — VITAMIN D 25 HYDROXY (VIT D DEFICIENCY, FRACTURES): Vit D, 25-Hydroxy: 22.1 ng/mL — ABNORMAL LOW (ref 30.0–100.0)

## 2019-10-02 DIAGNOSIS — Z79899 Other long term (current) drug therapy: Secondary | ICD-10-CM | POA: Diagnosis not present

## 2019-10-02 MED FILL — GABAPENTIN 100 MG CAP: 100 | 30 days supply | Qty: 30 | Fill #0

## 2019-10-02 MED FILL — traMADol HCL 50 MG TABS: 50 | 30 days supply | Qty: 240 | Fill #0

## 2019-10-02 MED FILL — CYCLOBENZAPRINE HCL 5 MG TA: 5 | 30 days supply | Qty: 30 | Fill #0

## 2019-10-03 NOTE — Telephone Encounter (Signed)
FYI:  Pt saw her cholesterol was high and noted that she has been having symptoms associated with this and had thought they were a part of her anxiety, pt has had on and off chest pain, SOB, fatigue, pain in her neck back and upper abdomen along with cold or numb extremity. I advised her to make an appointment to discuss these issues and her concerns. Advised in the meantime if she has an episode of SOB, chest pain or tightness it would best for her to be seen in emergency.

## 2019-10-04 ENCOUNTER — Encounter: Payer: Self-pay | Admitting: Physical Therapy

## 2019-10-04 ENCOUNTER — Other Ambulatory Visit: Payer: Self-pay

## 2019-10-04 ENCOUNTER — Ambulatory Visit: Payer: Medicare HMO | Admitting: Physical Therapy

## 2019-10-04 DIAGNOSIS — R252 Cramp and spasm: Secondary | ICD-10-CM

## 2019-10-04 DIAGNOSIS — M545 Low back pain, unspecified: Secondary | ICD-10-CM

## 2019-10-04 DIAGNOSIS — M6281 Muscle weakness (generalized): Secondary | ICD-10-CM

## 2019-10-04 DIAGNOSIS — R293 Abnormal posture: Secondary | ICD-10-CM | POA: Diagnosis not present

## 2019-10-04 DIAGNOSIS — R279 Unspecified lack of coordination: Secondary | ICD-10-CM | POA: Diagnosis not present

## 2019-10-04 DIAGNOSIS — M542 Cervicalgia: Secondary | ICD-10-CM | POA: Diagnosis not present

## 2019-10-04 MED FILL — OFLOXACIN 0.3% EAR DROPS: 0.3 | 20 days supply | Qty: 5 | Fill #0

## 2019-10-04 MED FILL — AMITRIPTYLINE HCL 10 MG TAB: 10 | 30 days supply | Qty: 60 | Fill #1

## 2019-10-04 NOTE — Therapy (Addendum)
[facility] [address], [place], [place], [address] Phone: [phone]   Fax:  [phone]  Physical Therapy Evaluation  Patient Details  Name: Margaret Fletcher MRN: [id number] Date of Birth: April 19, 1970 Referring Provider (PT): Flynt, Gwinda Passe, MD   Encounter Date: 10/04/2019   PT End of Session - 10/04/19 1718    Visit Number 1    Date for PT Re-Evaluation 11/29/19    Authorization Type [employer]    PT Start Time 7253   late   PT Stop Time 1314    PT Time Calculation (min) 35 min    Activity Tolerance Patient tolerated treatment well    Behavior During Therapy [facility] for tasks assessed/performed           Past Medical History:  Diagnosis Date  . Adie's pupil, left   . Alopecia   . Anxiety   . Autoimmune disease ([place])    "Alopecia"  . Bacterial vaginosis   . Cyst of spleen   . Depression   . Disorder of oral soft tissue 03/17/2019  . Frequent UTI   . Hypertension   . Hypertrophy of breast   . IIH (idiopathic intracranial hypertension) 2017   "related to headaches"  . Injury of jawline 03/17/2019  . Migraine headache    denies, ruled out - Propranolol.Using for headaches  . Non-suppurative otitis media 03/17/2019    Past Surgical History:  Procedure Laterality Date  . ANAL RECTAL MANOMETRY N/A 06/02/2019   Procedure: ANO RECTAL MANOMETRY;  Surgeon: Gatha Mayer, MD;  [facility];  Service: Endoscopy;  Laterality: N/A;  . APPENDECTOMY    . BREAST EXCISIONAL BIOPSY Right 2017   removed at time of reduction  . BREAST REDUCTION SURGERY Bilateral 03/26/2015   Procedure: BILATERAL BREAST REDUCTION  ;  Surgeon: Youlanda Roys, MD;  Location: [facility];  Service: Plastics;  Laterality: Bilateral;  . BREATH TEK H PYLORI N/A 05/21/2014   Procedure: BREATH TEK H PYLORI;  Surgeon: Greer Pickerel, MD;  Location[facility];  Service: General;  Laterality: N/A;  .  CELIAC PLEXUS BLOCK  07/18/2018   Per patient it was done at [place]    . CESAREAN SECTION    . CHOLECYSTECTOMY    . DILITATION & CURRETTAGE/HYSTROSCOPY WITH NOVASURE ABLATION N/A 07/19/2012   Procedure: HYSTEROSCOPY WITH NOVASURE ABLATION;  Surgeon: Farrel Gobble. Harrington Challenger, MD;  Location: [facility];  Service: Gynecology;  Laterality: N/A;  . ENDOMETRIAL ABLATION    . ESOPHAGOGASTRODUODENOSCOPY N/A 08/16/2015   Procedure: UPPER ESOPHAGOGASTRODUODENOSCOPY (EGD);  Surgeon: Greer Pickerel, MD;  Location: [facility];  Service: General;  Laterality: N/A;  . ESOPHAGOGASTRODUODENOSCOPY N/A 02/21/2016   Procedure: ESOPHAGOGASTRODUODENOSCOPY (EGD);  Surgeon: Greer Pickerel, MD;  Location: [facility];  Service: General;  Laterality: N/A;  . ESOPHAGOGASTRODUODENOSCOPY N/A 02/24/2017   Procedure: ESOPHAGOGASTRODUODENOSCOPY (EGD);  Surgeon: Greer Pickerel, MD;  [facility];  Service: General;  Laterality: N/A;  . IR GENERIC HISTORICAL  10/04/2015   IR [place] Vivianne Master 10/04/2015 Markus Daft, MD [facility]  . IR GENERIC HISTORICAL  01/31/2016   IR [place] GASTRO/COLONIC TUBE PERCUT W/FLUORO 01/31/2016 Corrie Mckusick, DO [facility]  . IR GENERIC HISTORICAL  04/06/2016   IR [place] TUBE PERCUT W/FLUORO 04/06/2016 Arne [place], MD [facility]  . IR GENERIC HISTORICAL  05/14/2016   IR GASTRIC TUBE PERC CHG W/O IMG GUIDE 05/14/2016 Sandi Mariscal, MD [facility]  .  LAPAROSCOPIC GASTROSTOMY N/A 09/30/2015   Procedure: LAPAROSCOPIC GASTROSTOMY TUBE PLACEMENT;  Surgeon: Greer Pickerel, MD;  [facility];  Service: General;  Laterality: N/A;  . LAPAROSCOPIC ROUX-EN-Y GASTRIC BYPASS WITH HIATAL HERNIA REPAIR N/A 07/08/2015   Procedure: LAPAROSCOPIC ROUX-EN-Y GASTRIC BYPASS WITH HIATAL HERNIA REPAIR WITH UPPER ENDOSCOPY;  Surgeon: Greer Pickerel, MD;  [facility];  Service: General;  Laterality: N/A;  . LAPAROSCOPIC SMALL BOWEL RESECTION N/A 09/30/2015   Procedure:  LAPAROSCOPIC REVISION OF ROUX LIMB;  Surgeon: Greer Pickerel, MD;  [facility];  Service: General;  Laterality: N/A;  . LAPAROSCOPY N/A 09/30/2015   Procedure: LAPAROSCOPY DIAGNOSTIC;  Surgeon: Greer Pickerel, MD;  [facility];  Service: General;  Laterality: N/A;  . REDUCTION MAMMAPLASTY Bilateral 2017  . TUBAL LIGATION    . WISDOM TOOTH EXTRACTION      There were no vitals filed for this visit.    Subjective Assessment - 10/04/19 1248    Subjective I am feeling a lot of pain today.  It starts and slowly creeps on.  I start gabapentin today.  Wraps around left flank.  States she is not sure whether she is not drinking enough water or if it's food related.  I cannot eat and drink the recommended amounts.  The recommended is 3x16 oz bottles of water and 3 protein shakes then anything else on top of that.  I have a flare up like this 2x/month    Patient Stated Goals decrease pain    Currently in Pain? Yes    Pain Score 10-Worst pain ever    Pain Location Abdomen    Pain Descriptors / Indicators Aching    Pain Radiating Towards umbilicus to ribcage wrapping around    Pain Onset More than a month ago    Pain Frequency Intermittent    Aggravating Factors  not sure happens when the abdomen hurts    Multiple Pain Sites No              [facility] PT Assessment - 10/05/19 0001      Assessment   Medical Diagnosis lumbar radiculopathy    Referring Provider (PT) Flynt, Gwinda Passe, MD      Precautions   Precautions None      Restrictions   Weight Bearing Restrictions No      Balance Screen   Has the patient fallen in the past 6 months No      [place] residence    Living Arrangements Spouse/significant other      Prior Function   Level of Independence Independent    Vocation Unemployed      Cognition   Overall Cognitive Status Within Functional Limits for tasks assessed      Observation/Other Assessments   Observations 39 .5" one inch  above umbilicus to measure abdominal circumference      Posture/Postural Control   Postural Limitations Forward head;Increased thoracic kyphosis;Anterior pelvic tilt;Increased lumbar lordosis      ROM / Strength   AROM / PROM / Strength Strength      Strength   Overall Strength Comments abdominal MMT 4/5 no DRA; hip Lt abd/add 4-/5; Rt 4/5 abd/add      Flexibility   Soft Tissue Assessment /Muscle Length yes    Hamstrings 90% bil      Palpation   Palpation comment scar tissue adhesions present in abdomen Lt upper quadrant ; TTP around abdomen; bilat gluteals tight; swelling in abdomen and TTP; bilat gluteals  tight                      Objective measurements completed on examination: See above findings.                 PT Short Term Goals - 10/05/19 1056      PT SHORT TERM GOAL #1   Title Pt will be Ind c an initial HEP for back pain    Time 3    Period Weeks    Status New    Target Date 10/25/19             PT Long Term Goals - 10/04/19 1312      PT LONG TERM GOAL #1   Title Pt will be ind with final HEP    Time 8    Period Weeks    Status New    Target Date 11/29/19      PT LONG TERM GOAL #2   Title Pt will have at least one method to reduce pain when having a flare up    Time 8    Period Weeks    Status New    Target Date 11/29/19      PT LONG TERM GOAL #3   Title Pt will have flare ups reduced to 1x/month due to improved core strength    Time 8    Period Weeks    Status New    Target Date 11/29/19      PT LONG TERM GOAL #4   Title Pt will be able to bulge and relax pelvic floor correctly for improved normal bowel function and decreased intra-abdominal pressure during a BM    Time 8    Period Weeks    Status New    Target Date 11/29/19      PT LONG TERM GOAL #5   Title .Marland KitchenMarland Kitchen                  Plan - 10/05/19 1054    Clinical Impression Statement Pt presents to clinic today due to low back pain.  Pt has been to  clinic recently for constipation and continues to do HEP.  Today pt is experiencing a lot of abdominal discomfort that wraps around to the back and this is when she feels the back pain.  Pt has abdominal swelling with circumference 39.5" . Pt has abdominal and hip weakness as well.  Pt demonstrates posture with increased lumbar lordosis and rounded shoulders.  She will benefit from skilled PT to address the impairments so she can achieve maximum function and quality of life.    Personal Factors and Comorbidities Comorbidity 3+    Comorbidities appendix removal; c-section x 2; small bowel resection; tubal ligation    Examination-Activity Limitations Bed Mobility;Bathing;Lift;Bend;Carry;Dressing;Sleep;Reach Overhead;Stand;Sit;Transfers;Toileting;Continence    Examination-Participation Restrictions Laundry;Community Activity;Yard Work;Shop;Interpersonal Relationship    Stability/Clinical Decision Making Stable/Uncomplicated    Clinical Decision Making Low    Rehab Potential Good    PT Frequency 2x / week    PT Duration 6 weeks    PT Treatment/Interventions ADLs/Self Care Home Management;Electrical Stimulation;Iontophoresis 4mg /ml Dexamethasone;Traction;Moist Heat;Ultrasound;Gait training;Functional mobility training;Balance training;Therapeutic exercise;Therapeutic activities;Patient/family education;Manual techniques;Passive range of motion;Dry needling;Taping;Energy conservation;Biofeedback;Cryotherapy;Neuromuscular re-education    PT Next Visit Plan core strength, hip strength, posture; re-assess abdominal bulging    PT Home Exercise Plan [id number]; added [id number] for pelvic floor    Consulted and Agree with Plan of Care Patient  Patient will benefit from skilled therapeutic intervention in order to improve the following deficits and impairments:  Improper body mechanics, Pain, Decreased mobility, Decreased activity tolerance, Decreased range of motion, Impaired UE functional use,  Decreased balance, Decreased coordination, Increased fascial restricitons, Impaired tone, Decreased strength, Increased edema  Visit Diagnosis: Muscle weakness (generalized)  Cramp and spasm  Low back pain, unspecified back pain laterality, unspecified chronicity, unspecified whether sciatica present     Problem List Patient Active Problem List   Diagnosis Date Noted  . Urinary frequency 06/20/2019  . Urinary urgency 06/20/2019  . Weakness of pelvic floor 06/20/2019  . Constipation due to outlet dysfunction   . Essential hypertension, benign 05/30/2019  . 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 History of fall 04/10/2019  . Spondylosis, cervical, with myelopathy 03/17/2019  . 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 Low back pain 03/17/2019  . Non-suppurative otitis media 03/17/2019  . Injury of jawline 03/17/2019  . Disorder of oral soft tissue 03/17/2019  . Reactive depression 06/13/2017  . Chronic pain syndrome 06/13/2017  . Polypharmacy 06/13/2017  . Empty sella syndrome ([place]) 05/01/2017  . Adjustment disorder with mixed anxiety and depressed mood 10/13/2016  . 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 Hypokalemia 02/22/2016  . Constipation, chronic 02/22/2016  . Memory loss due to medical condition 12/14/2015  . Chronic fatigue 12/14/2015  . Recurrent dehydration 09/30/2015  . Chronic nausea 08/23/2015  . Abdominal pain, chronic, epigastric 08/20/2015  . Protein-calorie malnutrition, moderate ([place]) 08/19/2015  . Mild obstructive sleep apnea 07/08/2015  . Dyslipidemia 07/08/2015  . S/P gastric bypass 07/08/2015  . IIH (idiopathic intracranial hypertension) 06/03/2015  . Vision changes 04/30/2015  . Perceived hearing changes 04/30/2015  . Chronic daily headache 04/21/2014  . Liver lesion 03/21/2013  . Splenic cyst 03/21/2013    Jule Ser, PT 10/05/2019, 2:23 PM  [facility] [address], [place], [place], [address] Phone: [phone]   Fax:  [phone]  Name: Margaret Fletcher MRN: [id number] Date of Birth: 07-22-1970

## 2019-10-05 NOTE — Therapy (Signed)
[facility] [address] [place], [place], [address] Phone: [phone]   Fax:  [phone]  Physical Therapy Evaluation  Patient Details  Name: Margaret Fletcher MRN: [id number] Date of Birth: August 11, 1970 Referring Provider (PT): Flynt, Gwinda Passe, MD   Encounter Date: 10/04/2019   PT End of Session - 10/04/19 1718    Visit Number 1    Date for PT Re-Evaluation 11/29/19    Authorization Type [employer]    PT Start Time 8938   late   PT Stop Time 1314    PT Time Calculation (min) 35 min    Activity Tolerance Patient tolerated treatment well    Behavior During Therapy [facility] for tasks assessed/performed           Past Medical History:  Diagnosis Date  . Adie's pupil, left   . Alopecia   . Anxiety   . Autoimmune disease ([place])    "Alopecia"  . Bacterial vaginosis   . Cyst of spleen   . Depression   . Disorder of oral soft tissue 03/17/2019  . Frequent UTI   . Hypertension   . Hypertrophy of breast   . IIH (idiopathic intracranial hypertension) 2017   "related to headaches"  . Injury of jawline 03/17/2019  . Migraine headache    denies, ruled out - Propranolol.Using for headaches  . Non-suppurative otitis media 03/17/2019    Past Surgical History:  Procedure Laterality Date  . ANAL RECTAL MANOMETRY N/A 06/02/2019   Procedure: ANO RECTAL MANOMETRY;  Surgeon: Gatha Mayer, MD;  [facility];  Service: Endoscopy;  Laterality: N/A;  . APPENDECTOMY    . BREAST EXCISIONAL BIOPSY Right 2017   removed at time of reduction  . BREAST REDUCTION SURGERY Bilateral 03/26/2015   Procedure: BILATERAL BREAST REDUCTION  ;  Surgeon: Youlanda Roys, MD;  Location: [facility];  Service: Plastics;  Laterality: Bilateral;  . BREATH TEK H PYLORI N/A 05/21/2014   Procedure: BREATH TEK H PYLORI;  Surgeon: Greer Pickerel, MD;  [facility];  Service: General;  Laterality: N/A;  .  CELIAC PLEXUS BLOCK  07/18/2018   Per patient it was done at [place]    . CESAREAN SECTION    . CHOLECYSTECTOMY    . DILITATION & CURRETTAGE/HYSTROSCOPY WITH NOVASURE ABLATION N/A 07/19/2012   Procedure: HYSTEROSCOPY WITH NOVASURE ABLATION;  Surgeon: Farrel Gobble. Harrington Challenger, MD;  Location: [facility];  Service: Gynecology;  Laterality: N/A;  . ENDOMETRIAL ABLATION    . ESOPHAGOGASTRODUODENOSCOPY N/A 08/16/2015   Procedure: UPPER ESOPHAGOGASTRODUODENOSCOPY (EGD);  Surgeon: Greer Pickerel, MD;  [facility];  Service: General;  Laterality: N/A;  . ESOPHAGOGASTRODUODENOSCOPY N/A 02/21/2016   Procedure: ESOPHAGOGASTRODUODENOSCOPY (EGD);  Surgeon: Greer Pickerel, MD;  [facility];  Service: General;  Laterality: N/A;  . ESOPHAGOGASTRODUODENOSCOPY N/A 02/24/2017   Procedure: ESOPHAGOGASTRODUODENOSCOPY (EGD);  Surgeon: Greer Pickerel, MD;  [facility];  Service: General;  Laterality: N/A;  . IR GENERIC HISTORICAL  10/04/2015   IR [place] Vivianne Master 10/04/2015 Markus Daft, MD [facility]  . IR GENERIC HISTORICAL  01/31/2016   IR [place] GASTRO/COLONIC TUBE PERCUT W/FLUORO 01/31/2016 Corrie Mckusick, DO [facility]  . IR GENERIC HISTORICAL  04/06/2016   IR [place] TUBE PERCUT W/FLUORO 04/06/2016 Arne [place], MD [facility]  . IR GENERIC HISTORICAL  05/14/2016   IR GASTRIC TUBE PERC CHG W/O IMG GUIDE 05/14/2016 Sandi Mariscal, MD [facility]  .  LAPAROSCOPIC GASTROSTOMY N/A 09/30/2015   Procedure: LAPAROSCOPIC GASTROSTOMY TUBE PLACEMENT;  Surgeon: Greer Pickerel, MD;  [facility];  Service: General;  Laterality: N/A;  . LAPAROSCOPIC ROUX-EN-Y GASTRIC BYPASS WITH HIATAL HERNIA REPAIR N/A 07/08/2015   Procedure: LAPAROSCOPIC ROUX-EN-Y GASTRIC BYPASS WITH HIATAL HERNIA REPAIR WITH UPPER ENDOSCOPY;  Surgeon: Greer Pickerel, MD;  [facility];  Service: General;  Laterality: N/A;  . LAPAROSCOPIC SMALL BOWEL RESECTION N/A 09/30/2015   Procedure:  LAPAROSCOPIC REVISION OF ROUX LIMB;  Surgeon: Greer Pickerel, MD;  [facility];  Service: General;  Laterality: N/A;  . LAPAROSCOPY N/A 09/30/2015   Procedure: LAPAROSCOPY DIAGNOSTIC;  Surgeon: Greer Pickerel, MD;  [facility];  Service: General;  Laterality: N/A;  . REDUCTION MAMMAPLASTY Bilateral 2017  . TUBAL LIGATION    . WISDOM TOOTH EXTRACTION      There were no vitals filed for this visit.    Subjective Assessment - 10/04/19 1248    Subjective I am feeling a lot of pain today.  It starts and slowly creeps on.  I start gabapentin today.  Wraps around left flank.  States she is not sure whether she is not drinking enough water or if it's food related.  I cannot eat and drink the recommended amounts.  The recommended is 3x16 oz bottles of water and 3 protein shakes then anything else on top of that.  I have a flare up like this 2x/month    Patient Stated Goals decrease pain    Currently in Pain? Yes    Pain Score 10-Worst pain ever    Pain Location Abdomen    Pain Descriptors / Indicators Aching    Pain Radiating Towards umbilicus to ribcage wrapping around    Pain Onset More than a month ago    Pain Frequency Intermittent    Aggravating Factors  not sure happens when the abdomen hurts    Multiple Pain Sites No              [facility] PT Assessment - 10/05/19 0001      Assessment   Medical Diagnosis lumbar radiculopathy    Referring Provider (PT) Flynt, Gwinda Passe, MD      Precautions   Precautions None      Restrictions   Weight Bearing Restrictions No      Balance Screen   Has the patient fallen in the past 6 months No      [place] residence    Living Arrangements Spouse/significant other      Prior Function   Level of Independence Independent    Vocation Unemployed      Cognition   Overall Cognitive Status Within Functional Limits for tasks assessed      Observation/Other Assessments   Observations 39 .5" one inch  above umbilicus to measure abdominal circumference      Posture/Postural Control   Postural Limitations Forward head;Increased thoracic kyphosis;Anterior pelvic tilt;Increased lumbar lordosis      ROM / Strength   AROM / PROM / Strength Strength      Strength   Overall Strength Comments abdominal MMT 4/5 no DRA; hip Lt abd/add 4-/5; Rt 4/5 abd/add      Flexibility   Soft Tissue Assessment /Muscle Length yes    Hamstrings 90% bil      Palpation   Palpation comment scar tissue adhesions present in abdomen Lt upper quadrant ; TTP around abdomen; bilat gluteals tight; swelling in abdomen and TTP; bilat gluteals  tight                      Objective measurements completed on examination: See above findings.                 PT Short Term Goals - 10/05/19 1056      PT SHORT TERM GOAL #1   Title Pt will be Ind c an initial HEP for back pain    Time 3    Period Weeks    Status New    Target Date 10/25/19             PT Long Term Goals - 10/04/19 1312      PT LONG TERM GOAL #1   Title Pt will be ind with final HEP    Time 8    Period Weeks    Status New    Target Date 11/29/19      PT LONG TERM GOAL #2   Title Pt will have at least one method to reduce pain when having a flare up    Time 8    Period Weeks    Status New    Target Date 11/29/19      PT LONG TERM GOAL #3   Title Pt will have flare ups reduced to 1x/month due to improved core strength    Time 8    Period Weeks    Status New    Target Date 11/29/19      PT LONG TERM GOAL #4   Title Pt will be able to bulge and relax pelvic floor correctly for improved normal bowel function and decreased intra-abdominal pressure during a BM    Time 8    Period Weeks    Status New    Target Date 11/29/19      PT LONG TERM GOAL #5   Title .Marland KitchenMarland Kitchen                  Plan - 10/05/19 1054    Clinical Impression Statement Pt presents to clinic today due to low back pain.  Pt has been to  clinic recently for constipation and continues to do HEP.  Today pt is experiencing a lot of abdominal discomfort that wraps around to the back and this is when she feels the back pain.  Pt has abdominal swelling with circumference 39.5" . Pt has abdominal and hip weakness as well.  Pt demonstrates posture with increased lumbar lordosis and rounded shoulders.  She will benefit from skilled PT to address the impairments so she can achieve maximum function and quality of life.    Personal Factors and Comorbidities Comorbidity 3+    Comorbidities appendix removal; c-section x 2; small bowel resection; tubal ligation    Examination-Activity Limitations Bed Mobility;Bathing;Lift;Bend;Carry;Dressing;Sleep;Reach Overhead;Stand;Sit;Transfers;Toileting;Continence    Examination-Participation Restrictions Laundry;Community Activity;Yard Work;Shop;Interpersonal Relationship    Stability/Clinical Decision Making Stable/Uncomplicated    Clinical Decision Making Low    Rehab Potential Good    PT Frequency 2x / week    PT Duration 6 weeks    PT Treatment/Interventions ADLs/Self Care Home Management;Electrical Stimulation;Iontophoresis 4mg /ml Dexamethasone;Traction;Moist Heat;Ultrasound;Gait training;Functional mobility training;Balance training;Therapeutic exercise;Therapeutic activities;Patient/family education;Manual techniques;Passive range of motion;Dry needling;Taping;Energy conservation;Biofeedback;Cryotherapy;Neuromuscular re-education    PT Next Visit Plan core strength, hip strength, posture; re-assess abdominal bulging    PT Home Exercise Plan [id number]; added [id number] for pelvic floor    Consulted and Agree with Plan of Care Patient  Patient will benefit from skilled therapeutic intervention in order to improve the following deficits and impairments:  Improper body mechanics, Pain, Decreased mobility, Decreased activity tolerance, Decreased range of motion, Impaired UE functional use,  Decreased balance, Decreased coordination, Increased fascial restricitons, Impaired tone, Decreased strength, Increased edema  Visit Diagnosis: Muscle weakness (generalized)  Cramp and spasm  Low back pain, unspecified back pain laterality, unspecified chronicity, unspecified whether sciatica present     Problem List Patient Active Problem List   Diagnosis Date Noted  . Urinary frequency 06/20/2019  . Urinary urgency 06/20/2019  . Weakness of pelvic floor 06/20/2019  . Constipation due to outlet dysfunction   . Essential hypertension, benign 05/30/2019  . 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 History of fall 04/10/2019  . Spondylosis, cervical, with myelopathy 03/17/2019  . 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 Low back pain 03/17/2019  . Non-suppurative otitis media 03/17/2019  . Injury of jawline 03/17/2019  . Disorder of oral soft tissue 03/17/2019  . Reactive depression 06/13/2017  . Chronic pain syndrome 06/13/2017  . Polypharmacy 06/13/2017  . Empty sella syndrome ([place]) 05/01/2017  . Adjustment disorder with mixed anxiety and depressed mood 10/13/2016  . 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 Hypokalemia 02/22/2016  . Constipation, chronic 02/22/2016  . Memory loss due to medical condition 12/14/2015  . Chronic fatigue 12/14/2015  . Recurrent dehydration 09/30/2015  . Chronic nausea 08/23/2015  . Abdominal pain, chronic, epigastric 08/20/2015  . Protein-calorie malnutrition, moderate ([place]) 08/19/2015  . Mild obstructive sleep apnea 07/08/2015  . Dyslipidemia 07/08/2015  . S/P gastric bypass 07/08/2015  . IIH (idiopathic intracranial hypertension) 06/03/2015  . Vision changes 04/30/2015  . Perceived hearing changes 04/30/2015  . Chronic daily headache 04/21/2014  . Liver lesion 03/21/2013  . Splenic cyst 03/21/2013    Jule Ser, PT 10/05/2019, 2:26 PM  [facility] [address] [place], [place], [address] Phone: [phone]   Fax:  [phone]  Name: Margaret Fletcher MRN: [id number] Date of Birth: 1970-12-06

## 2019-10-05 NOTE — Addendum Note (Signed)
Addended by: Su Hoff on: 10/05/2019 02:45 PM   Modules accepted: Orders

## 2019-10-06 NOTE — Addendum Note (Signed)
Addended by: Su Hoff on: 10/06/2019 01:03 PM   Modules accepted: Orders

## 2019-10-09 ENCOUNTER — Other Ambulatory Visit (HOSPITAL_COMMUNITY): Payer: Self-pay | Admitting: Nurse Practitioner

## 2019-10-09 MED FILL — ONDANSETRON ODT 8 MG TABLET: 8 | 6 days supply | Qty: 20 | Fill #0

## 2019-10-10 ENCOUNTER — Other Ambulatory Visit (HOSPITAL_COMMUNITY): Payer: Self-pay | Admitting: Psychiatry

## 2019-10-11 MED FILL — BUPROPION HCL SR 150 MG TAB: 150 | 30 days supply | Qty: 30 | Fill #0

## 2019-10-16 ENCOUNTER — Ambulatory Visit (INDEPENDENT_AMBULATORY_CARE_PROVIDER_SITE_OTHER): Payer: Medicare HMO | Admitting: Licensed Clinical Social Worker

## 2019-10-16 DIAGNOSIS — F411 Generalized anxiety disorder: Secondary | ICD-10-CM

## 2019-10-16 DIAGNOSIS — F331 Major depressive disorder, recurrent, moderate: Secondary | ICD-10-CM | POA: Diagnosis not present
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
## 2019-10-23 ENCOUNTER — Ambulatory Visit (HOSPITAL_COMMUNITY): Payer: Medicare HMO | Admitting: Licensed Clinical Social Worker

## 2019-10-27 ENCOUNTER — Telehealth (INDEPENDENT_AMBULATORY_CARE_PROVIDER_SITE_OTHER): Payer: Medicare HMO | Admitting: Psychiatry

## 2019-10-27 ENCOUNTER — Encounter (HOSPITAL_COMMUNITY): Payer: Self-pay | Admitting: Psychiatry

## 2019-10-27 DIAGNOSIS — F331 Major depressive disorder, recurrent, moderate: Secondary | ICD-10-CM | POA: Diagnosis not present

## 2019-10-27 DIAGNOSIS — F411 Generalized anxiety disorder: Secondary | ICD-10-CM

## 2019-10-27 DIAGNOSIS — R5382 Chronic fatigue, unspecified: Secondary | ICD-10-CM

## 2019-10-27 MED ORDER — BUSPIRONE HCL 10 MG PO TABS: 10.0000 mg | ORAL_TABLET | Freq: Two times a day (BID) | ORAL | 0 refills | Status: DC

## 2019-10-27 MED FILL — busPIRone HCL 10 MG TABS: 10 | 30 days supply | Qty: 60 | Fill #0
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
## 2019-10-30 ENCOUNTER — Other Ambulatory Visit: Payer: Self-pay

## 2019-10-30 ENCOUNTER — Encounter: Payer: Self-pay | Admitting: Family Medicine

## 2019-10-30 ENCOUNTER — Other Ambulatory Visit: Payer: Self-pay | Admitting: Family Medicine

## 2019-10-30 ENCOUNTER — Ambulatory Visit (INDEPENDENT_AMBULATORY_CARE_PROVIDER_SITE_OTHER): Payer: Medicare HMO | Admitting: Family Medicine

## 2019-10-30 VITALS — BP 118/83 | HR 88 | Temp 98.3°F | Ht 68.0 in | Wt 184.3 lb

## 2019-10-30 DIAGNOSIS — G2581 Restless legs syndrome: Secondary | ICD-10-CM

## 2019-10-30 DIAGNOSIS — Z23 Encounter for immunization: Secondary | ICD-10-CM

## 2019-10-30 DIAGNOSIS — K912 Postsurgical malabsorption, not elsewhere classified: Secondary | ICD-10-CM

## 2019-10-30 DIAGNOSIS — K219 Gastro-esophageal reflux disease without esophagitis: Secondary | ICD-10-CM

## 2019-10-30 MED ORDER — FAMOTIDINE 20 MG PO TABS: 20.0000 mg | ORAL_TABLET | Freq: Two times a day (BID) | ORAL | 5 refills | Status: AC

## 2019-10-30 MED ORDER — PRAMIPEXOLE DIHYDROCHLORIDE 0.125 MG PO TABS: 0.1250 mg | ORAL_TABLET | Freq: Every day | ORAL | 4 refills | Status: DC

## 2019-10-30 MED FILL — PRAMIPEXOLE 0.125 MG TABLET: 0.125 | 30 days supply | Qty: 60 | Fill #0

## 2019-10-30 MED FILL — FAMOTIDINE 20 MG TABLET: 20 | 30 days supply | Qty: 60 | Fill #0

## 2019-10-30 NOTE — Progress Notes (Signed)
8/23/202110:54 AM  Margaret Fletcher 1970-06-04, 49 y.o., female [id number]  Chief Complaint  Patient presents with  . Follow-up    restless leg, mirapex seems to be doing well    HPI:   Patient is a 49 y.o. female with past medical history significant for migraine,idiopathicintracranial hypertension,OSA, constipation, empty sella syndrome, G tube feeding, s/p gastric bypasswho presents today for routine followup  Last OV July 2021 - started mirapex for RLS  She is tolerating mirapex well She is taking 0.125mg  every night It feels that it slows it down and able to sleep better Takes bypass surgery multivitamin which has iron  Saw psych 3 days ago Neuro working on changing topomax given side effects to injectable migraine prophylactic medications  She has been wo pepcid and omeprazole for several months She has had increase use in tums and having increased coughing at night  Has intermittent hypoglycemia, down to 50s Not seeing nutritionist anymore 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  Lab Results  Component Value Date   FERRITIN 43 09/29/2019   Lab Results  Component Value Date   CREATININE 1.62 (H) 09/29/2019   CREATININE 1.48 (H) 07/03/2019   CREATININE 1.45 (H) 06/30/2019     Depression screen PHQ 2/9 09/13/2019 08/10/2019 06/20/2019  Decreased Interest 0 2 0  Down, Depressed, Hopeless 0 - 0  PHQ - 2 Score 0 2 0  Altered sleeping - 3 0  Tired, decreased energy - 3 0  Change in appetite - 2 0  Feeling bad or failure about yourself  - 0 0  Trouble concentrating - 0 0  Moving slowly or fidgety/restless - 0 0  Suicidal thoughts - 0 0  PHQ-9 Score - 10 0  Difficult doing work/chores - Somewhat difficult Not difficult at all  Some recent data might be hidden    Fall Risk  09/29/2019 09/13/2019 08/10/2019 06/20/2019 05/30/2019  Falls in the past year? 0 0 1 0 0  Number falls in past yr: 0 0 1 0 0  Comment - - swelling - -  Injury with Fall? 0 0  1 0 0  Comment - - - - -  Follow up Falls evaluation completed Falls evaluation completed Falls evaluation completed;Education provided Falls evaluation completed Falls evaluation completed     Allergies  Allergen Reactions  . Sulfa Antibiotics Anaphylaxis  . Metoclopramide Other (See Comments)    Elevated prolactin >5x ULN induction lactation when taking scheduled for several months  . Zosyn [Piperacillin Sod-Tazobactam So] Rash    Has patient had a PCN reaction causing immediate rash, facial/tongue/throat swelling, SOB or lightheadedness with hypotension: No Has patient had a PCN reaction causing severe rash involving mucus membranes or skin necrosis: No Has patient had a PCN reaction that required hospitalization: Unknown--inpatient when reaction occurred Has patient had a PCN reaction occurring within the last 10 years: Yes If all of the above answers are "NO", then may proceed with Cephalosporin use.     Prior to Admission medications   Medication Sig Start Date End Date Taking? Authorizing Provider  albuterol (PROVENTIL HFA;VENTOLIN HFA) 108 (90 Base) MCG/ACT inhaler Inhale 2 puffs into the lungs every 6 (six) hours as needed for wheezing or shortness of breath. 01/25/18  Yes Shawnee Knapp, MD  amitriptyline (ELAVIL) 10 MG tablet TAKE 2 TABLETS BY MOUTH AT BEDTIME. 07/25/19  Yes Rutherford Guys, MD  azelastine (ASTELIN) 0.1 % nasal spray Place 1 spray into both nostrils 2 (two) times daily. Use in  each nostril as directed 04/18/19  Yes Maximiano Coss, NP  BAYER MICROLET LANCETS lancets Use as instructed 06/13/17  Yes Shawnee Knapp, MD  BELBUCA 600 MCG FILM Take 1 strip by mouth 2 (two) times daily. 04/17/19  Yes [provider]  buPROPion (WELLBUTRIN SR) 150 MG 12 hr tablet TAKE 1 TABLET BY MOUTH DAILY 10/11/19  Yes Merian Capron, MD  busPIRone (BUSPAR) 10 MG tablet Take 1 tablet (10 mg total) by mouth 2 (two) times daily. 10/27/19  Yes Merian Capron, MD  CALCIUM-VITAMIN D PO Take 1  tablet by mouth 2 (two) times daily. chewable   Yes [provider]  chlorhexidine (PERIDEX) 0.12 % solution Use as directed 15 mLs in the mouth or throat 2 (two) times daily. 03/17/19  Yes Wendall Mola, NP  ciprofloxacin-hydrocortisone (CIPRO HC OTIC) OTIC suspension Place 3 drops into the left ear 2 (two) times daily. 09/13/19  Yes Maximiano Coss, NP  Cyanocobalamin (VITAMIN B-12 SL) Place 1 drop under the tongue daily. 1 dropperful   Yes [provider]  cyclobenzaprine (FLEXERIL) 5 MG tablet TAKE 1 TABLET BY MOUTH 3 TIMES DAILY AS NEEDED FOR MUSCLE SPASMS. MAY TAKE 2 TABS BY MOUTH AT BEDTIME 06/17/18  Yes Rutherford Guys, MD  docusate sodium (COLACE) 100 MG capsule Take 100 mg by mouth daily as needed.    Yes [provider]  eletriptan (RELPAX) 40 MG tablet Take 1 tablet (40 mg total) by mouth every 2 (two) hours as needed for migraine or headache. Do not use >2 doses/24 hours 08/29/19  Yes Lomax, Amy, NP  famotidine (PEPCID) 20 MG tablet Take 1 tablet (20 mg total) by mouth 2 (two) times daily. 02/28/19  Yes Rutherford Guys, MD  fluticasone [facility]) 50 MCG/ACT nasal spray Place 2 sprays into both nostrils daily. Patient taking differently: Place 2 sprays into both nostrils daily as needed for allergies.  04/18/19  Yes Maximiano Coss, NP  Galcanezumab-gnlm [facility]) 120 MG/ML SOAJ Inject 240 mg into the skin every 30 (thirty) days. 08/29/19  Yes Lomax, Amy, NP  glucose blood (CONTOUR NEXT TEST) test strip 1 each by Other route 4 (four) times daily as needed (hypoglycemia). K91.2 06/10/17  Yes Shawnee Knapp, MD  hydrocortisone (ANUSOL-HC) 25 MG suppository Place 1 suppository (25 mg total) rectally at bedtime. Use for 5 nights 03/23/19  Yes Noralyn Pick, NP  Multiple Vitamins-Minerals (BARIATRIC MULTIVITAMINS/IRON PO) Take 1 tablet by mouth 3 (three) times daily.   Yes [provider]  naloxegol oxalate (MOVANTIK) 25 MG TABS tablet Take 1 tablet (25 mg  total) by mouth daily. 04/19/19  Yes Gatha Mayer, MD  naratriptan (AMERGE) 2.5 MG tablet Take 1 tablet (2.5 mg total) by mouth as needed for migraine. Take one (1) tablet at onset of headache; if returns or does not resolve, may repeat after 4 hours; do not exceed five (5) mg in 24 hours. 09/25/19  Yes Lomax, Amy, NP  Omega-3 Fatty Acids (OMEGA-3 FISH OIL PO) Take by mouth daily.   Yes [provider]  omeprazole (PRILOSEC) 20 MG capsule Take 1 capsule (20 mg total) by mouth every morning. 03/23/19  Yes Noralyn Pick, NP  ondansetron (ZOFRAN ODT) 4 MG disintegrating tablet Take 1 tablet (4 mg total) by mouth every 8 (eight) hours as needed for nausea or vomiting. 06/13/19  Yes Gatha Mayer, MD  ondansetron (ZOFRAN ODT) 8 MG disintegrating tablet 8mg  ODT q8 hours prn nausea 06/21/19  Yes Palumbo,  April, MD  OVER THE COUNTER MEDICATION Meca root   Yes [provider]  OVER THE COUNTER MEDICATION Calcium, magnesium, zinc with D3 (one tablet daily)   Yes [provider]  OVER THE COUNTER MEDICATION Collagen and vitamin c and biotin (takes one daily)   Yes [provider]  [place] for muscle and joint health   Yes [provider]  polyethylene glycol (MIRALAX / GLYCOLAX) packet Take 17 g by mouth daily as needed (for constipation.).    Yes [provider]  potassium chloride (KLOR-CON) 10 MEQ tablet Take 1 tablet (10 mEq total) by mouth 3 (three) times daily. 06/30/19  Yes Rutherford Guys, MD  pramipexole (MIRAPEX) 0.125 MG tablet Take 1-2 tablets (0.125-0.25 mg total) by mouth at bedtime. Take 2-3 hours before bedtime. 09/29/19  Yes Rutherford Guys, MD  PREMARIN vaginal cream Place 1 Applicatorful vaginally 2 (two) times a week.  11/28/18  Yes [provider]  Prucalopride Succinate (MOTEGRITY) 2 MG TABS Take 1 tablet (2 mg total) by mouth daily. 05/23/19  Yes Gatha Mayer, MD  simethicone (MYLICON) 80 MG  chewable tablet Chew 80 mg by mouth every 6 (six) hours as needed for flatulence.   Yes [provider]  topiramate (TOPAMAX) 100 MG tablet Take 1.5 tablets (150 mg total) by mouth daily. 08/29/19  Yes Lomax, Amy, NP  triamterene-hydrochlorothiazide (MAXZIDE-25) 37.5-25 MG tablet Take 1 tablet by mouth daily. 05/30/19  Yes Rutherford Guys, MD  desvenlafaxine (PRISTIQ) 100 MG 24 hr tablet TAKE 1 TABLET BY MOUTH ONCE A DAY Patient taking differently: Take 100 mg by mouth daily.  03/20/19 07/31/19  Rutherford Guys, MD    Past Medical History:  Diagnosis Date  . Adie's pupil, left   . Alopecia   . Anxiety   . Autoimmune disease ([place])    "Alopecia"  . Bacterial vaginosis   . Cyst of spleen   . Depression   . Disorder of oral soft tissue 03/17/2019  . Frequent UTI   . Hypertension   . Hypertrophy of breast   . IIH (idiopathic intracranial hypertension) 2017   "related to headaches"  . Injury of jawline 03/17/2019  . Migraine headache    denies, ruled out - Propranolol.Using for headaches  . Non-suppurative otitis media 03/17/2019    Past Surgical History:  Procedure Laterality Date  . ANAL RECTAL MANOMETRY N/A 06/02/2019   Procedure: ANO RECTAL MANOMETRY;  Surgeon: Gatha Mayer, MD;  Location: [facility];  Service: Endoscopy;  Laterality: N/A;  . APPENDECTOMY    . BREAST EXCISIONAL BIOPSY Right 2017   removed at time of reduction  . BREAST REDUCTION SURGERY Bilateral 03/26/2015   Procedure: BILATERAL BREAST REDUCTION  ;  Surgeon: Youlanda Roys, MD;  Location: [place];  Service: Plastics;  Laterality: Bilateral;  . BREATH TEK H PYLORI N/A 05/21/2014   Procedure: BREATH TEK H PYLORI;  Surgeon: Greer Pickerel, MD;  Location: [facility];  Service: General;  Laterality: N/A;  . CELIAC PLEXUS BLOCK  07/18/2018   Per patient it was done at [place]    . CESAREAN SECTION    . CHOLECYSTECTOMY    . DILITATION & CURRETTAGE/HYSTROSCOPY  WITH NOVASURE ABLATION N/A 07/19/2012   Procedure: HYSTEROSCOPY WITH NOVASURE ABLATION;  Surgeon: Farrel Gobble. Harrington Challenger, MD;  Location: [facility];  Service: Gynecology;  Laterality: N/A;  . ENDOMETRIAL ABLATION    . ESOPHAGOGASTRODUODENOSCOPY N/A 08/16/2015  Procedure: UPPER ESOPHAGOGASTRODUODENOSCOPY (EGD);  Surgeon: Greer Pickerel, MD;  Location: [facility];  Service: General;  Laterality: N/A;  . ESOPHAGOGASTRODUODENOSCOPY N/A 02/21/2016   Procedure: ESOPHAGOGASTRODUODENOSCOPY (EGD);  Surgeon: Greer Pickerel, MD;  Location: [facility];  Service: General;  Laterality: N/A;  . ESOPHAGOGASTRODUODENOSCOPY N/A 02/24/2017   Procedure: ESOPHAGOGASTRODUODENOSCOPY (EGD);  Surgeon: Greer Pickerel, MD;  Location: [facility];  Service: General;  Laterality: N/A;  . IR GENERIC HISTORICAL  10/04/2015   IR [place] Vivianne Master 10/04/2015 Markus Daft, MD [facility]  . IR GENERIC HISTORICAL  01/31/2016   IR [place] GASTRO/COLONIC TUBE PERCUT W/FLUORO 01/31/2016 Corrie Mckusick, DO [facility]  . IR GENERIC HISTORICAL  04/06/2016   IR [place] TUBE PERCUT W/FLUORO 04/06/2016 Arne Cleveland, MD [facility]  . IR GENERIC HISTORICAL  05/14/2016   IR GASTRIC TUBE PERC CHG W/O IMG GUIDE 05/14/2016 Sandi Mariscal, MD [facility]  . LAPAROSCOPIC GASTROSTOMY N/A 09/30/2015   Procedure: LAPAROSCOPIC GASTROSTOMY TUBE PLACEMENT;  Surgeon: Greer Pickerel, MD;  Location: [facility];  Service: General;  Laterality: N/A;  . LAPAROSCOPIC ROUX-EN-Y GASTRIC BYPASS WITH HIATAL HERNIA REPAIR N/A 07/08/2015   Procedure: LAPAROSCOPIC ROUX-EN-Y GASTRIC BYPASS WITH HIATAL HERNIA REPAIR WITH UPPER ENDOSCOPY;  Surgeon: Greer Pickerel, MD;  Location: [facility];  Service: General;  Laterality: N/A;  . LAPAROSCOPIC SMALL BOWEL RESECTION N/A 09/30/2015   Procedure: LAPAROSCOPIC REVISION OF ROUX LIMB;  Surgeon: Greer Pickerel, MD;  Location: [facility];  Service: General;  Laterality: N/A;  . LAPAROSCOPY N/A 09/30/2015   Procedure: LAPAROSCOPY  DIAGNOSTIC;  Surgeon: Greer Pickerel, MD;  Location: [facility];  Service: General;  Laterality: N/A;  . REDUCTION MAMMAPLASTY Bilateral 2017  . TUBAL LIGATION    . WISDOM TOOTH EXTRACTION      Social History   Tobacco Use  . Smoking status: Never Smoker  . Smokeless tobacco: Never Used  Substance Use Topics  . Alcohol use: Not Currently    Family History  Problem Relation Age of Onset  . Cancer Mother        late 65'-50  . Breast cancer Mother        late 24'-50  . Stroke Father   . Colon polyps Father   . Diabetes Brother   . Seizures Son   . Breast cancer Maternal Aunt        late 40'-50  . Breast cancer Maternal Aunt        late 40'-50  . Breast cancer Maternal Aunt        late 40'-50  . Migraines Neg Hx     ROS Per hpi  OBJECTIVE:  Today's Vitals   10/30/19 1042  BP: 118/83  Pulse: 88  Temp: 98.3 F (36.8 C)  SpO2: 100%  Weight: 184 lb 4.8 oz (83.6 kg)  Height: 5\' 8"  (1.727 m)   Body mass index is 28.02 kg/m.   Physical Exam Vitals and nursing note reviewed.  Constitutional:      Appearance: She is well-developed.  HENT:     Head: Normocephalic and atraumatic.  Eyes:     General: No scleral icterus.    Conjunctiva/sclera: Conjunctivae normal.     Pupils: Pupils are equal, round, and reactive to light.  Pulmonary:     Effort: Pulmonary effort is normal.  Musculoskeletal:     Cervical back: Neck supple.  Skin:    General: Skin is warm and dry.  Neurological:     Mental Status: She is alert and oriented to person, place,  and time.     No results found for this or any previous visit (from the past 24 hour(s)).  No results found.   ASSESSMENT and PLAN  1. RLS (restless legs syndrome) Controlled. Continue current regime. Discussed increasing consumption of iron rich foods as ferritin > 70 would be preferred.  2. Gastroesophageal reflux disease without esophagitis Uncontrolled. Restart pepcid.   3. Hypoglycemia after GI (gastrointestinal)  surgery Discussed mgt of hypoglycemia, advised seeing bariatric nutritionist to aide with prevention  4. Need for prophylactic vaccination and inoculation against influenza - Flu Vaccine QUAD 36+ mos IM  Other orders - pramipexole (MIRAPEX) 0.125 MG tablet; Take 1-2 tablets (0.125-0.25 mg total) by mouth at bedtime. Take 2-3 hours before bedtime. - famotidine (PEPCID) 20 MG tablet; Take 1 tablet (20 mg total) by mouth 2 (two) times daily.  Return in about 3 months (around 01/30/2020).    Rutherford Guys, MD [facility] [place], [place] [address] Ph.  [phone] Fax [phone]

## 2019-10-30 NOTE — Patient Instructions (Addendum)
Hypoglycemia Hypoglycemia is when the sugar (glucose) level in your blood is too low. Signs of low blood sugar may include:  Feeling: ? Hungry. ? Worried or nervous (anxious). ? Sweaty and clammy. ? Confused. ? Dizzy. ? Sleepy. ? Sick to your stomach (nauseous).  Having: ? A fast heartbeat. ? A headache. ? A change in your vision. ? Tingling or no feeling (numbness) around your mouth, lips, or tongue. ? Jerky movements that you cannot control (seizure).  Having trouble with: ? Moving (coordination). ? Sleeping. ? Passing out (fainting). ? Getting upset easily (irritability). Low blood sugar can happen to people who have diabetes and people who do not have diabetes. Low blood sugar can happen quickly, and it can be an emergency. Treating low blood sugar Low blood sugar is often treated by eating or drinking something sugary right away, such as:  Fruit juice, 4-6 oz (120-150 mL).  Regular soda (not diet soda), 4-6 oz (120-150 mL).  Low-fat milk, 4 oz (120 mL).  Several pieces of hard candy.  Sugar or honey, 1 Tbsp (15 mL). Treating low blood sugar if you have diabetes If you can think clearly and swallow safely, follow the 15:15 rule:  Take 15 grams of a fast-acting carb (carbohydrate). Talk with your doctor about how much you should take.  Always keep a source of fast-acting carb with you, such as: ? Sugar tablets (glucose pills). Take 3-4 pills. ? 6-8 pieces of hard candy. ? 4-6 oz (120-150 mL) of fruit juice. ? 4-6 oz (120-150 mL) of regular (not diet) soda. ? 1 Tbsp (15 mL) honey or sugar.  Check your blood sugar 15 minutes after you take the carb.  If your blood sugar is still at or below 70 mg/dL (3.9 mmol/L), take 15 grams of a carb again.  If your blood sugar does not go above 70 mg/dL (3.9 mmol/L) after 3 tries, get help right away.  After your blood sugar goes back to normal, eat a meal or a snack within 1 hour.  Treating very low blood sugar If your  blood sugar is at or below 54 mg/dL (3 mmol/L), you have very low blood sugar (severe hypoglycemia). This may also cause:  Passing out.  Jerky movements you cannot control (seizure).  Losing consciousness (coma). This is an emergency. Do not wait to see if the symptoms will go away. Get medical help right away. Call your local emergency services (911 in the [place] Do not drive yourself to the hospital. If you have very low blood sugar and you cannot eat or drink, you may need a glucagon shot (injection). A family member or friend should learn how to check your blood sugar and how to give you a glucagon shot. Ask your doctor if you need to have a glucagon shot kit at home. Follow these instructions at home: General instructions  Take over-the-counter and prescription medicines only as told by your doctor.  Stay aware of your blood sugar as told by your doctor.  Limit alcohol intake to no more than 1 drink a day for nonpregnant women and 2 drinks a day for men. One drink equals 12 oz of beer (355 mL), 5 oz of wine (148 mL), or 1 oz of hard liquor (44 mL).  Keep all follow-up visits as told by your doctor. This is important. If you have diabetes:   Follow your diabetes care plan as told by your doctor. Make sure you: ? Know the signs of low blood sugar. ?  sugar. ? Take your medicines as told. ? Follow your exercise and meal plan. ? Eat on time. Do not skip meals. ? Check your blood sugar as often as told by your doctor. Always check it before and after exercise. ? Follow your sick day plan when you cannot eat or drink normally. Make this plan ahead of time with your doctor.  Share your diabetes care plan with: ? Your work or school. ? People you live with.  Check your pee (urine) for ketones: ? When you are sick. ? As told by your doctor.  Carry a card or wear jewelry that says you have diabetes. Contact a doctor if:  You have trouble keeping your blood sugar in your target  range.  You have low blood sugar often. Get help right away if:  You still have symptoms after you eat or drink something sugary.  Your blood sugar is at or below 54 mg/dL (3 mmol/L).  You have jerky movements that you cannot control.  You pass out. These symptoms may be an emergency. Do not wait to see if the symptoms will go away. Get medical help right away. Call your local emergency services (911 in the [place] Do not drive yourself to the hospital. Summary  Hypoglycemia happens when the level of sugar (glucose) in your blood is too low.  Low blood sugar can happen to people who have diabetes and people who do not have diabetes. Low blood sugar can happen quickly, and it can be an emergency.  Make sure you know the signs of low blood sugar and know how to treat it.  Always keep a source of sugar (fast-acting carb) with you to treat low blood sugar. This information is not intended to replace advice given to you by your health care provider. Make sure you discuss any questions you have with your health care provider. Document Revised: 06/16/2018 Document Reviewed: 03/29/2015 Elsevier Patient Education  2020 Elsevier Inc.    If you have lab work done today you will be contacted with your lab results within the next 2 weeks.  If you have not heard from us then please contact us. The fastest way to get your results is to register for My Chart.   IF you received an x-ray today, you will receive an invoice from [facility]. Please contact [facility] at [phone] with questions or concerns regarding your invoice.   IF you received labwork today, you will receive an invoice from [facility]. Please contact [facility] at [phone] with questions or concerns regarding your invoice.   Our billing staff will not be able to assist you with questions regarding bills from these companies.  You will be contacted with the lab results as soon as they are available. The  fastest way to get your results is to activate your My Chart account. Instructions are located on the last page of this paperwork. If you have not heard from us regarding the results in 2 weeks, please contact this office.      

## 2019-10-31 ENCOUNTER — Ambulatory Visit (HOSPITAL_COMMUNITY): Payer: Medicare HMO | Admitting: Licensed Clinical Social Worker

## 2019-11-08 MED FILL — CYCLOBENZAPRINE HCL 5 MG TA: 5 | 30 days supply | Qty: 30 | Fill #1

## 2019-11-08 MED FILL — AMITRIPTYLINE HCL 10 MG TAB: 10 | 30 days supply | Qty: 60 | Fill #2

## 2019-11-08 MED FILL — GABAPENTIN 100 MG CAPSULE: 100 | 30 days supply | Qty: 30 | Fill #1

## 2019-11-08 MED FILL — TRIAMTERENE-HCTZ 37.5-25 MG: 37.5-25 | 90 days supply | Qty: 90 | Fill #1

## 2019-11-08 MED FILL — BUPROPION HCL SR 150 MG TAB: 150 | 30 days supply | Qty: 30 | Fill #1

## 2019-11-08 MED FILL — PRAMIPEXOLE 0.125 MG TABLET: 0.125 | 30 days supply | Qty: 60 | Fill #0

## 2019-11-08 MED FILL — ONDANSETRON ODT 8 MG TABLET: 8 | 6 days supply | Qty: 20 | Fill #1

## 2019-11-15 ENCOUNTER — Telehealth: Payer: Self-pay | Admitting: Family Medicine

## 2019-11-15 NOTE — Telephone Encounter (Signed)
LVMTCB to resch toc with Dr. Carlota Raspberry for 02/02/20 due to it being a Holiday. Please advise.

## 2019-11-16 ENCOUNTER — Ambulatory Visit: Payer: Medicare HMO | Attending: Nurse Practitioner | Admitting: Physical Therapy

## 2019-11-16 ENCOUNTER — Ambulatory Visit (HOSPITAL_COMMUNITY): Payer: Medicare HMO | Admitting: Licensed Clinical Social Worker

## 2019-11-16 MED FILL — traMADol HCL 50 MG TABS: 50 | 30 days supply | Qty: 240 | Fill #1

## 2019-11-16 NOTE — Progress Notes (Signed)
Therapist contacted patient through my chart for session but she did not respond. Patient is no show for session

## 2019-11-21 MED FILL — TOPIRAMATE 100 MG TABLET: 100 | 90 days supply | Qty: 135 | Fill #1

## 2019-11-21 MED FILL — busPIRone HCL 10 MG TABS: 10 | 30 days supply | Qty: 60 | Fill #0

## 2019-11-23 ENCOUNTER — Ambulatory Visit: Payer: Medicare HMO | Admitting: Physical Therapy

## 2019-11-30 ENCOUNTER — Ambulatory Visit: Payer: Medicare HMO | Admitting: Physical Therapy

## 2019-12-01 ENCOUNTER — Encounter (HOSPITAL_COMMUNITY): Payer: Self-pay | Admitting: Licensed Clinical Social Worker

## 2019-12-01 ENCOUNTER — Ambulatory Visit (INDEPENDENT_AMBULATORY_CARE_PROVIDER_SITE_OTHER): Payer: Medicare HMO | Admitting: Licensed Clinical Social Worker

## 2019-12-01 DIAGNOSIS — R5382 Chronic fatigue, unspecified: Secondary | ICD-10-CM

## 2019-12-01 DIAGNOSIS — F411 Generalized anxiety disorder: Secondary | ICD-10-CM

## 2019-12-01 DIAGNOSIS — F331 Major depressive disorder, recurrent, moderate: Secondary | ICD-10-CM

## 2019-12-01 NOTE — Progress Notes (Signed)
Attempted to contact patient for session through my chart and patient did not respond. Session is a no show

## 2019-12-04 ENCOUNTER — Encounter: Payer: Self-pay | Admitting: Registered Nurse

## 2019-12-04 NOTE — Progress Notes (Signed)
Acute Office Visit  Subjective:    Patient ID: Margaret Fletcher, female    DOB: 1970/06/06, 49 y.o.   MRN: [id number]  Chief Complaint  Patient presents with   Urinary Tract Infection    Patient states she has been experiencing urine frequency , pain in back. Per patient she was taking AZO it worked for a while but the pain didnt go away.   Ear Pain    Left ear pain for 2-3 days she states she had some ear drops from previous ear pain but didnt seem to help this time,     HPI Patient is in today for uti and ear pain  UTI: frequent UTI. Has been taking azo. Some relief. Urine frequency and some CVA pain. No fevers, chills, fatigue, sweats, dizziness.   Ear pain: L ear. 2-3 days. Has been using drops - unsure of name/medication. Has not been effective. Does not think they are antibiotic. No change to hearing. No drainage from ear. No other URI symptoms.   Otherwise feels well.   Past Medical History:  Diagnosis Date   Adie's pupil, left    Alopecia    Anxiety    Autoimmune disease ([place])    "Alopecia"   Bacterial vaginosis    Cyst of spleen    Depression    Disorder of oral soft tissue 03/17/2019   Frequent UTI    Hypertension    Hypertrophy of breast    IIH (idiopathic intracranial hypertension) 2017   "related to headaches"   Injury of jawline 03/17/2019   Migraine headache    denies, ruled out - Propranolol.Using for headaches   Non-suppurative otitis media 03/17/2019    Past Surgical History:  Procedure Laterality Date   ANAL RECTAL MANOMETRY N/A 06/02/2019   Procedure: ANO RECTAL MANOMETRY;  Surgeon: Gatha Mayer, MD;  Location: [facility];  Service: Endoscopy;  Laterality: N/A;   APPENDECTOMY     BREAST EXCISIONAL BIOPSY Right 2017   removed at time of reduction   BREAST REDUCTION SURGERY Bilateral 03/26/2015   Procedure: BILATERAL BREAST REDUCTION  ;  Surgeon: Youlanda Roys, MD;  Location: [facility];   Service: Plastics;  Laterality: Bilateral;   BREATH TEK H PYLORI N/A 05/21/2014   Procedure: BREATH TEK H PYLORI;  Surgeon: Greer Pickerel, MD;  Location: [facility];  Service: General;  Laterality: N/A;   CELIAC PLEXUS BLOCK  07/18/2018   Per patient it was done at [facility] N/A 07/19/2012   Procedure: [place];  Surgeon: Farrel Gobble. Harrington Challenger, MD;  Location: [facility];  Service: Gynecology;  Laterality: N/A;   ENDOMETRIAL ABLATION     ESOPHAGOGASTRODUODENOSCOPY N/A 08/16/2015   Procedure: UPPER ESOPHAGOGASTRODUODENOSCOPY (EGD);  Surgeon: Greer Pickerel, MD;  Location: [facility];  Service: General;  Laterality: N/A;   ESOPHAGOGASTRODUODENOSCOPY N/A 02/21/2016   Procedure: ESOPHAGOGASTRODUODENOSCOPY (EGD);  Surgeon: Greer Pickerel, MD;  Location: [facility];  Service: General;  Laterality: N/A;   ESOPHAGOGASTRODUODENOSCOPY N/A 02/24/2017   Procedure: ESOPHAGOGASTRODUODENOSCOPY (EGD);  Surgeon: Greer Pickerel, MD;  Location: [facility];  Service: General;  Laterality: N/A;   IR GENERIC HISTORICAL  10/04/2015   IR [place] TUBE Eber Jones Vivianne Master 10/04/2015 Markus Daft, MD [facility]   IR GENERIC HISTORICAL  01/31/2016   IR [place] TUBE PERCUT W/FLUORO 01/31/2016 Corrie Mckusick, DO [facility]  IR GENERIC HISTORICAL  04/06/2016   IR [place] TUBE PERCUT W/FLUORO 04/06/2016 Arne Cleveland, MD [facility]   IR GENERIC HISTORICAL  05/14/2016   IR GASTRIC TUBE PERC CHG W/O IMG GUIDE 05/14/2016 Sandi Mariscal, MD [facility]   LAPAROSCOPIC GASTROSTOMY N/A 09/30/2015   Procedure: LAPAROSCOPIC GASTROSTOMY TUBE PLACEMENT;  Surgeon: Greer Pickerel, MD;  Location: [facility];  Service: General;  Laterality: N/A;   LAPAROSCOPIC ROUX-EN-Y GASTRIC BYPASS WITH HIATAL HERNIA REPAIR N/A 07/08/2015   Procedure: LAPAROSCOPIC ROUX-EN-Y  GASTRIC BYPASS WITH HIATAL HERNIA REPAIR WITH UPPER ENDOSCOPY;  Surgeon: Greer Pickerel, MD;  Location: [facility];  Service: General;  Laterality: N/A;   LAPAROSCOPIC SMALL BOWEL RESECTION N/A 09/30/2015   Procedure: LAPAROSCOPIC REVISION OF ROUX LIMB;  Surgeon: Greer Pickerel, MD;  Location: [facility];  Service: General;  Laterality: N/A;   LAPAROSCOPY N/A 09/30/2015   Procedure: LAPAROSCOPY DIAGNOSTIC;  Surgeon: Greer Pickerel, MD;  Location: [facility];  Service: General;  Laterality: N/A;   REDUCTION MAMMAPLASTY Bilateral 2017   TUBAL LIGATION     WISDOM TOOTH EXTRACTION      Family History  Problem Relation Age of Onset   Cancer Mother        late 7'-50   Breast cancer Mother        late 97'-50   Stroke Father    Colon polyps Father    Diabetes Brother    Seizures Son    Breast cancer Maternal Aunt        late 40'-50   Breast cancer Maternal Aunt        late 40'-50   Breast cancer Maternal Aunt        late 40'-50   Migraines Neg Hx     Social History   Socioeconomic History   Marital status: Divorced    Spouse name: Not on file   Number of children: 2   Years of education: 16   Highest education level: Not on file  Occupational History   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  Tobacco Use   Smoking status: Never Smoker   Smokeless tobacco: Never Used  Scientific laboratory technician Use: Never used  Substance and Sexual Activity   Alcohol use: Not Currently   Drug use: No   Sexual activity: Not on file  Other Topics Concern   Not on file  Social History Narrative   Lives with partner   Caffeine use: minimal coffee   Right-handed   Social Determinants of Health   Financial Resource Strain:    Difficulty of Paying Living Expenses: Not on file  Food Insecurity:    Worried About Charity fundraiser in the Last Year: Not on file   [employer] of Food in the Last Year: Not on file  Transportation Needs:    Lack of Transportation (Medical): Not on file   Lack of  Transportation (Non-Medical): Not on file  Physical Activity:    Days of Exercise per Week: Not on file   Minutes of Exercise per Session: Not on file  Stress:    Feeling of Stress : Not on file  Social Connections:    Frequency of Communication with Friends and Family: Not on file   Frequency of Social Gatherings with Friends and Family: Not on file   Attends Religious Services: Not on file   Active Member of Clubs or Organizations: Not on file   Attends Archivist Meetings: Not on file   Marital Status: Not on  file  Intimate Partner Violence:    Fear of Current or Ex-Partner: Not on file   Emotionally Abused: Not on file   Physically Abused: Not on file   Sexually Abused: Not on file    Outpatient Medications Prior to Visit  Medication Sig Dispense Refill   albuterol (PROVENTIL HFA;VENTOLIN HFA) 108 (90 Base) MCG/ACT inhaler Inhale 2 puffs into the lungs every 6 (six) hours as needed for wheezing or shortness of breath. 1 Inhaler 0   amitriptyline (ELAVIL) 10 MG tablet TAKE 2 TABLETS BY MOUTH AT BEDTIME. 60 tablet 2   azelastine (ASTELIN) 0.1 % nasal spray Place 1 spray into both nostrils 2 (two) times daily. Use in each nostril as directed 30 mL 0   BAYER MICROLET LANCETS lancets Use as instructed 100 each 12   BELBUCA 600 MCG FILM Take 1 strip by mouth 2 (two) times daily.     CALCIUM-VITAMIN D PO Take 1 tablet by mouth 2 (two) times daily. chewable     chlorhexidine (PERIDEX) 0.12 % solution Use as directed 15 mLs in the mouth or throat 2 (two) times daily. 120 mL 0   Cyanocobalamin (VITAMIN B-12 SL) Place 1 drop under the tongue daily. 1 dropperful     cyclobenzaprine (FLEXERIL) 5 MG tablet TAKE 1 TABLET BY MOUTH 3 TIMES DAILY AS NEEDED FOR MUSCLE SPASMS. MAY TAKE 2 TABS BY MOUTH AT BEDTIME 40 tablet 2   docusate sodium (COLACE) 100 MG capsule Take 100 mg by mouth daily as needed.      eletriptan (RELPAX) 40 MG tablet Take 1 tablet (40 mg total)  by mouth every 2 (two) hours as needed for migraine or headache. Do not use >2 doses/24 hours 10 tablet 5   fluticasone (FLONASE) 50 MCG/ACT nasal spray Place 2 sprays into both nostrils daily. (Patient taking differently: Place 2 sprays into both nostrils daily as needed for allergies. ) 16 g 6   Galcanezumab-gnlm (EMGALITY) 120 MG/ML SOAJ Inject 240 mg into the skin every 30 (thirty) days. 2 pen 0   glucose blood (CONTOUR NEXT TEST) test strip 1 each by Other route 4 (four) times daily as needed (hypoglycemia). K91.2 270 each 5   hydrocortisone (ANUSOL-HC) 25 MG suppository Place 1 suppository (25 mg total) rectally at bedtime. Use for 5 nights 5 suppository 0   Multiple Vitamins-Minerals (BARIATRIC MULTIVITAMINS/IRON PO) Take 1 tablet by mouth 3 (three) times daily.     naloxegol oxalate (MOVANTIK) 25 MG TABS tablet Take 1 tablet (25 mg total) by mouth daily. 30 tablet 1   Omega-3 Fatty Acids (OMEGA-3 FISH OIL PO) Take by mouth daily.     ondansetron (ZOFRAN ODT) 4 MG disintegrating tablet Take 1 tablet (4 mg total) by mouth every 8 (eight) hours as needed for nausea or vomiting. 20 tablet 5   ondansetron (ZOFRAN ODT) 8 MG disintegrating tablet 8mg  ODT q8 hours prn nausea 10 tablet 0   OVER THE COUNTER MEDICATION Meca root     OVER THE COUNTER MEDICATION Calcium, magnesium, zinc with D3 (one tablet daily)     OVER THE COUNTER MEDICATION Collagen and vitamin c and biotin (takes one daily)     OVER THE COUNTER MEDICATION Tunic for muscle and joint health     polyethylene glycol (MIRALAX / GLYCOLAX) packet Take 17 g by mouth daily as needed (for constipation.).      potassium chloride (KLOR-CON) 10 MEQ tablet Take 1 tablet (10 mEq total) by mouth 3 (three) times daily. 12  tablet 0   PREMARIN vaginal cream Place 1 Applicatorful vaginally 2 (two) times a week.      Prucalopride Succinate (MOTEGRITY) 2 MG TABS Take 1 tablet (2 mg total) by mouth daily. 14 tablet 0   simethicone  (MYLICON) 80 MG chewable tablet Chew 80 mg by mouth every 6 (six) hours as needed for flatulence.     topiramate (TOPAMAX) 100 MG tablet Take 1.5 tablets (150 mg total) by mouth daily. 135 tablet 1   triamterene-hydrochlorothiazide (MAXZIDE-25) 37.5-25 MG tablet Take 1 tablet by mouth daily. 90 tablet 1   buPROPion (WELLBUTRIN SR) 150 MG 12 hr tablet Take 1 tablet (150 mg total) by mouth daily. 30 tablet 1   busPIRone (BUSPAR) 5 MG tablet TAKE 1 TABLET BY MOUTH 3 TIMES DAILY. 90 tablet 0   famotidine (PEPCID) 20 MG tablet Take 1 tablet (20 mg total) by mouth 2 (two) times daily. 60 tablet 2   omeprazole (PRILOSEC) 20 MG capsule Take 1 capsule (20 mg total) by mouth every morning. 30 capsule 1   No facility-administered medications prior to visit.    Allergies  Allergen Reactions   Sulfa Antibiotics Anaphylaxis   Metoclopramide Other (See Comments)    Elevated prolactin >5x ULN induction lactation when taking scheduled for several months   Zosyn [Piperacillin Sod-Tazobactam So] Rash    Has patient had a PCN reaction causing immediate rash, facial/tongue/throat swelling, SOB or lightheadedness with hypotension: No Has patient had a PCN reaction causing severe rash involving mucus membranes or skin necrosis: No Has patient had a PCN reaction that required hospitalization: Unknown--inpatient when reaction occurred Has patient had a PCN reaction occurring within the last 10 years: Yes If all of the above answers are "NO", then may proceed with Cephalosporin use.     Review of Systems Per hpi, otherwise negative on 12 pt ros    Objective:    Physical Exam Vitals and nursing note reviewed.  Constitutional:      General: She is not in acute distress.    Appearance: Normal appearance. She is not ill-appearing, toxic-appearing or diaphoretic.  HENT:     Right Ear: Hearing, ear canal and external ear normal. No decreased hearing noted. No drainage. No middle ear effusion. Tympanic  membrane is not bulging.     Left Ear: Hearing, ear canal and external ear normal. No decreased hearing noted. No drainage. A middle ear effusion is present. Tympanic membrane is bulging.  Cardiovascular:     Rate and Rhythm: Normal rate and regular rhythm.  Pulmonary:     Effort: Pulmonary effort is normal. No respiratory distress.  Abdominal:     Tenderness: There is right CVA tenderness (mild) and left CVA tenderness (mild).  Neurological:     General: No focal deficit present.     Mental Status: She is alert and oriented to person, place, and time. Mental status is at baseline.  Psychiatric:        Mood and Affect: Mood normal.        Behavior: Behavior normal.        Thought Content: Thought content normal.        Judgment: Judgment normal.     BP (!) 128/91    Pulse 99    Temp 97.6 F (36.4 C) (Temporal)    Resp 18    Ht 5\' 8"  (1.727 m)    Wt 186 lb 6.4 oz (84.6 kg)    SpO2 100%    BMI 28.34 kg/m  Wt Readings from Last 3 Encounters:  10/30/19 184 lb 4.8 oz (83.6 kg)  09/29/19 182 lb (82.6 kg)  09/13/19 186 lb 6.4 oz (84.6 kg)    Health Maintenance Due  Topic Date Due   COVID-19 Vaccine (1) Never done    There are no preventive care reminders to display for this patient.   Lab Results  Component Value Date   TSH 1.310 02/28/2019   Lab Results  Component Value Date   WBC 10.0 06/30/2019   HGB 13.5 06/30/2019   HCT 40.3 06/30/2019   MCV 91.2 06/30/2019   PLT 326 06/30/2019   Lab Results  Component Value Date   NA 135 09/29/2019   K 4.4 09/29/2019   CO2 20 09/29/2019   GLUCOSE 90 09/29/2019   BUN 27 (H) 09/29/2019   CREATININE 1.62 (H) 09/29/2019   BILITOT 0.2 09/29/2019   ALKPHOS 117 09/29/2019   AST 25 09/29/2019   ALT 16 09/29/2019   PROT 7.7 09/29/2019   ALBUMIN 4.7 09/29/2019   CALCIUM 9.0 09/29/2019   ANIONGAP 9 06/30/2019   Lab Results  Component Value Date   CHOL 221 (H) 09/29/2019   Lab Results  Component Value Date   HDL 70  09/29/2019   Lab Results  Component Value Date   LDLCALC 139 (H) 09/29/2019   Lab Results  Component Value Date   TRIG 70 09/29/2019   Lab Results  Component Value Date   CHOLHDL 3.2 09/29/2019   Lab Results  Component Value Date   HGBA1C 5.3 03/16/2016       Assessment & Plan:   Problem List Items Addressed This Visit      Other   Urinary frequency - Primary   Relevant Orders   POCT URINALYSIS DIP (CLINITEK) (Completed)   Urine Culture (Completed)    Other Visit Diagnoses    Acute cystitis without hematuria       Infective otitis externa of left ear       Relevant Medications   ciprofloxacin-hydrocortisone (CIPRO HC OTIC) OTIC suspension       Meds ordered this encounter  Medications   nitrofurantoin, macrocrystal-monohydrate, (MACROBID) 100 MG capsule    Sig: Take 1 capsule (100 mg total) by mouth 2 (two) times daily for 5 days.    Dispense:  10 capsule    Refill:  0    Order Specific Question:   Supervising Provider    Answer:   Horald Pollen [[id number]]   ciprofloxacin-hydrocortisone (CIPRO HC OTIC) OTIC suspension    Sig: Place 3 drops into the left ear 2 (two) times daily.    Dispense:  10 mL    Refill:  0    Order Specific Question:   Supervising Provider    Answer:   Horald Pollen [[id number]]   fluconazole (DIFLUCAN) 150 MG tablet    Sig: Take 1 tablet (150 mg total) by mouth once for 1 dose.    Dispense:  1 tablet    Refill:  0    Order Specific Question:   Supervising Provider    Answer:   Horald Pollen [[id number]]   PLAN  POCT UA suspicious for UTI. Will treat with macrobid and sent culture  Will give cipro-hc otic for ear pain. Follow up if worsening or failing to improve  Fluconazole for yeast infection from abx use.   Pt familiar with sepsis / pyelo symptoms, will present to ED if warranted  Patient encouraged to call clinic with any questions,  comments, or concerns.  Maximiano Coss, NP

## 2019-12-05 ENCOUNTER — Other Ambulatory Visit: Payer: Self-pay

## 2019-12-05 ENCOUNTER — Ambulatory Visit (HOSPITAL_COMMUNITY)
Admission: EM | Admit: 2019-12-05 | Discharge: 2019-12-05 | Disposition: A | Discharge status: Home / Self Care | Payer: Medicare HMO | Attending: Emergency Medicine | Admitting: Emergency Medicine

## 2019-12-05 ENCOUNTER — Encounter (HOSPITAL_COMMUNITY): Payer: Self-pay

## 2019-12-05 DIAGNOSIS — H6983 Other specified disorders of Eustachian tube, bilateral: Secondary | ICD-10-CM

## 2019-12-05 DIAGNOSIS — R222 Localized swelling, mass and lump, trunk: Secondary | ICD-10-CM | POA: Diagnosis not present

## 2019-12-05 MED ORDER — FLUTICASONE PROPIONATE 50 MCG/ACT NA SUSP: 1.0000 | Freq: Every day | NASAL | 0 refills | Status: AC

## 2019-12-05 MED ORDER — NAPROXEN 500 MG PO TABS
500.0000 mg | ORAL_TABLET | Freq: Two times a day (BID) | ORAL | 0 refills | Status: AC
Start: 2019-12-05 — End: ?

## 2019-12-05 NOTE — ED Triage Notes (Signed)
Pt presents with complaints of a lump on her left upper chest. Complains that the area is tender. Reports she just noticed it this morning. Denies any other complaints.

## 2019-12-05 NOTE — Discharge Instructions (Signed)
[facility] center referral faxed Warm compresses Naprosyn or tylenol/ibuprofen for pain Follow up if not improving or worsening

## 2019-12-06 ENCOUNTER — Other Ambulatory Visit: Payer: Self-pay | Admitting: Emergency Medicine

## 2019-12-06 ENCOUNTER — Ambulatory Visit (INDEPENDENT_AMBULATORY_CARE_PROVIDER_SITE_OTHER): Payer: Medicare HMO

## 2019-12-06 ENCOUNTER — Encounter: Payer: Self-pay | Admitting: Emergency Medicine

## 2019-12-06 ENCOUNTER — Other Ambulatory Visit: Payer: Self-pay

## 2019-12-06 ENCOUNTER — Ambulatory Visit (INDEPENDENT_AMBULATORY_CARE_PROVIDER_SITE_OTHER): Payer: Medicare HMO | Admitting: Emergency Medicine

## 2019-12-06 VITALS — BP 116/82 | HR 90 | Temp 98.3°F | Ht 68.0 in | Wt 179.6 lb

## 2019-12-06 DIAGNOSIS — R591 Generalized enlarged lymph nodes: Secondary | ICD-10-CM

## 2019-12-06 DIAGNOSIS — M7989 Other specified soft tissue disorders: Secondary | ICD-10-CM

## 2019-12-06 DIAGNOSIS — R222 Localized swelling, mass and lump, trunk: Secondary | ICD-10-CM | POA: Diagnosis not present

## 2019-12-06 DIAGNOSIS — N63 Unspecified lump in unspecified breast: Secondary | ICD-10-CM

## 2019-12-06 DIAGNOSIS — R599 Enlarged lymph nodes, unspecified: Secondary | ICD-10-CM

## 2019-12-06 MED FILL — NAPROXEN 500 MG TABS: 500 | 15 days supply | Qty: 30 | Fill #0

## 2019-12-06 MED FILL — FLUTICASONE PROP 50 MCG SPR: 50 | 30 days supply | Qty: 16 | Fill #0

## 2019-12-06 NOTE — Progress Notes (Signed)
Margaret Fletcher 49 y.o.   Chief Complaint  Patient presents with  . mass on left breast    noticed it yesterday evening (hard mass) (near the neck bone)  . Abdominal Pain    HISTORY OF PRESENT ILLNESS: This is a 49 y.o. female complaining of inferior aspect of proximal left clavicle that she noticed yesterday. Went to urgent care center last night. Family history of breast cancer.  Mammograms and ultrasounds of breast done last April show no signs of malignancy. Has no other complaints or medical concerns today.  HPI   Prior to Admission medications   Medication Sig Start Date End Date Taking? Authorizing Provider  albuterol (PROVENTIL HFA;VENTOLIN HFA) 108 (90 Base) MCG/ACT inhaler Inhale 2 puffs into the lungs every 6 (six) hours as needed for wheezing or shortness of breath. 01/25/18   Shawnee Knapp, MD  amitriptyline (ELAVIL) 10 MG tablet TAKE 2 TABLETS BY MOUTH AT BEDTIME. 07/25/19   Rutherford Guys, MD  azelastine (ASTELIN) 0.1 % nasal spray Place 1 spray into both nostrils 2 (two) times daily. Use in each nostril as directed 04/18/19   Maximiano Coss, NP  BAYER MICROLET LANCETS lancets Use as instructed 06/13/17   Shawnee Knapp, MD  BELBUCA 600 MCG FILM Take 1 strip by mouth 2 (two) times daily. 04/17/19   [provider]  buPROPion (WELLBUTRIN SR) 150 MG 12 hr tablet TAKE 1 TABLET BY MOUTH DAILY 10/11/19   Merian Capron, MD  busPIRone (BUSPAR) 10 MG tablet Take 1 tablet (10 mg total) by mouth 2 (two) times daily. 10/27/19   Merian Capron, MD  CALCIUM-VITAMIN D PO Take 1 tablet by mouth 2 (two) times daily. chewable    [provider]  chlorhexidine (PERIDEX) 0.12 % solution Use as directed 15 mLs in the mouth or throat 2 (two) times daily. 03/17/19   Wendall Mola, NP  ciprofloxacin-hydrocortisone (CIPRO HC OTIC) OTIC suspension Place 3 drops into the left ear 2 (two) times daily. 09/13/19   Maximiano Coss, NP  Cyanocobalamin (VITAMIN B-12 SL) Place 1 drop  under the tongue daily. 1 dropperful    [provider]  cyclobenzaprine (FLEXERIL) 5 MG tablet TAKE 1 TABLET BY MOUTH 3 TIMES DAILY AS NEEDED FOR MUSCLE SPASMS. MAY TAKE 2 TABS BY MOUTH AT BEDTIME 06/17/18   Rutherford Guys, MD  docusate sodium (COLACE) 100 MG capsule Take 100 mg by mouth daily as needed.     [provider]  eletriptan (RELPAX) 40 MG tablet Take 1 tablet (40 mg total) by mouth every 2 (two) hours as needed for migraine or headache. Do not use >2 doses/24 hours 08/29/19   Lomax, Amy, NP  famotidine (PEPCID) 20 MG tablet Take 1 tablet (20 mg total) by mouth 2 (two) times daily. 10/30/19   Rutherford Guys, MD  fluticasone (FLONASE) 50 MCG/ACT nasal spray Place 1-2 sprays into both nostrils daily for 7 days. 12/05/19 12/12/19  Wieters, Hallie C, PA-C  Galcanezumab-gnlm (EMGALITY) 120 MG/ML SOAJ Inject 240 mg into the skin every 30 (thirty) days. 08/29/19   Lomax, Amy, NP  glucose blood (CONTOUR NEXT TEST) test strip 1 each by Other route 4 (four) times daily as needed (hypoglycemia). K91.2 06/10/17   Shawnee Knapp, MD  hydrocortisone (ANUSOL-HC) 25 MG suppository Place 1 suppository (25 mg total) rectally at bedtime. Use for 5 nights 03/23/19   Noralyn Pick, NP  Multiple Vitamins-Minerals (BARIATRIC MULTIVITAMINS/IRON PO) Take 1 tablet by mouth 3 (three)  times daily.    [provider]  naloxegol oxalate (MOVANTIK) 25 MG TABS tablet Take 1 tablet (25 mg total) by mouth daily. 04/19/19   Gatha Mayer, MD  naproxen (NAPROSYN) 500 MG tablet Take 1 tablet (500 mg total) by mouth 2 (two) times daily. 12/05/19   Wieters, Hallie C, PA-C  naratriptan (AMERGE) 2.5 MG tablet Take 1 tablet (2.5 mg total) by mouth as needed for migraine. Take one (1) tablet at onset of headache; if returns or does not resolve, may repeat after 4 hours; do not exceed five (5) mg in 24 hours. 09/25/19   Lomax, Amy, NP  Omega-3 Fatty Acids (OMEGA-3 FISH OIL PO) Take by mouth daily.     [provider]  ondansetron (ZOFRAN ODT) 4 MG disintegrating tablet Take 1 tablet (4 mg total) by mouth every 8 (eight) hours as needed for nausea or vomiting. 06/13/19   Gatha Mayer, MD  ondansetron (ZOFRAN ODT) 8 MG disintegrating tablet 8mg  ODT q8 hours prn nausea 06/21/19   Palumbo, April, MD  OVER THE COUNTER MEDICATION Meca root    [provider]  OVER THE COUNTER MEDICATION Calcium, magnesium, zinc with D3 (one tablet daily)    [provider]  OVER THE COUNTER MEDICATION Collagen and vitamin c and biotin (takes one daily)    [provider]  [place] for muscle and joint health    [provider]  polyethylene glycol (MIRALAX / GLYCOLAX) packet Take 17 g by mouth daily as needed (for constipation.).     [provider]  potassium chloride (KLOR-CON) 10 MEQ tablet Take 1 tablet (10 mEq total) by mouth 3 (three) times daily. 06/30/19   Rutherford Guys, MD  pramipexole (MIRAPEX) 0.125 MG tablet Take 1-2 tablets (0.125-0.25 mg total) by mouth at bedtime. Take 2-3 hours before bedtime. 10/30/19   Rutherford Guys, MD  PREMARIN vaginal cream Place 1 Applicatorful vaginally 2 (two) times a week.  11/28/18   [provider]  Prucalopride Succinate (MOTEGRITY) 2 MG TABS Take 1 tablet (2 mg total) by mouth daily. 05/23/19   Gatha Mayer, MD  simethicone (MYLICON) 80 MG chewable tablet Chew 80 mg by mouth every 6 (six) hours as needed for flatulence.    [provider]  topiramate (TOPAMAX) 100 MG tablet Take 1.5 tablets (150 mg total) by mouth daily. 08/29/19   Lomax, Amy, NP  triamterene-hydrochlorothiazide (MAXZIDE-25) 37.5-25 MG tablet Take 1 tablet by mouth daily. 05/30/19   Rutherford Guys, MD  desvenlafaxine (PRISTIQ) 100 MG 24 hr tablet TAKE 1 TABLET BY MOUTH ONCE A DAY Patient taking differently: Take 100 mg by mouth daily.  03/20/19 07/31/19  Rutherford Guys, MD    Allergies  Allergen  Reactions  . Sulfa Antibiotics Anaphylaxis  . Metoclopramide Other (See Comments)    Elevated prolactin >5x ULN induction lactation when taking scheduled for several months  . Zosyn [Piperacillin Sod-Tazobactam So] Rash    Has patient had a PCN reaction causing immediate rash, facial/tongue/throat swelling, SOB or lightheadedness with hypotension: No Has patient had a PCN reaction causing severe rash involving mucus membranes or skin necrosis: No Has patient had a PCN reaction that required hospitalization: Unknown--inpatient when reaction occurred Has patient had a PCN reaction occurring within the last 10 years: Yes If all of the above answers are "NO", then may proceed with Cephalosporin use.     Patient Active Problem List   Diagnosis Date Noted  .  Urinary frequency 06/20/2019  . Urinary urgency 06/20/2019  . Weakness of pelvic floor 06/20/2019  . Constipation due to outlet dysfunction   . Essential hypertension, benign 05/30/2019  . 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 History of fall 04/10/2019  . Spondylosis, cervical, with myelopathy 03/17/2019  . 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 Low back pain 03/17/2019  . Non-suppurative otitis media 03/17/2019  . Injury of jawline 03/17/2019  . Disorder of oral soft tissue 03/17/2019  . Chronic constipation 07/22/2018  . Reactive depression 06/13/2017  . Chronic pain syndrome 06/13/2017  . Polypharmacy 06/13/2017  . Empty sella syndrome ([place]) 05/01/2017  . Adjustment disorder with mixed anxiety and depressed mood 10/13/2016  . 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 Hypokalemia 02/22/2016  . Constipation, chronic 02/22/2016  . Memory loss due to medical condition 12/14/2015  . Chronic fatigue 12/14/2015  . Recurrent dehydration 09/30/2015  . Chronic nausea 08/23/2015  . Abdominal pain, chronic, epigastric 08/20/2015  . Protein-calorie malnutrition, moderate ([place]) 08/19/2015  . Mild  obstructive sleep apnea 07/08/2015  . Dyslipidemia 07/08/2015  . S/P gastric bypass 07/08/2015  . IIH (idiopathic intracranial hypertension) 06/03/2015  . Vision changes 04/30/2015  . Perceived hearing changes 04/30/2015  . Chronic daily headache 04/21/2014  . Liver lesion 03/21/2013  . Splenic cyst 03/21/2013    Past Medical History:  Diagnosis Date  . Adie's pupil, left   . Alopecia   . Anxiety   . Autoimmune disease ([place])    "Alopecia"  . Bacterial vaginosis   . Cyst of spleen   . Depression   . Disorder of oral soft tissue 03/17/2019  . Frequent UTI   . Hypertension   . Hypertrophy of breast   . IIH (idiopathic intracranial hypertension) 2017   "related to headaches"  . Injury of jawline 03/17/2019  . Migraine headache    denies, ruled out - Propranolol.Using for headaches  . Non-suppurative otitis media 03/17/2019    Past Surgical History:  Procedure Laterality Date  . ANAL RECTAL MANOMETRY N/A 06/02/2019   Procedure: ANO RECTAL MANOMETRY;  Surgeon: Gatha Mayer, MD;  Location: [facility];  Service: Endoscopy;  Laterality: N/A;  . APPENDECTOMY    . BREAST EXCISIONAL BIOPSY Right 2017   removed at time of reduction  . BREAST REDUCTION SURGERY Bilateral 03/26/2015   Procedure: BILATERAL BREAST REDUCTION  ;  Surgeon: Youlanda Roys, MD;  Location: [facility];  Service: Plastics;  Laterality: Bilateral;  . BREATH TEK H PYLORI N/A 05/21/2014   Procedure: BREATH TEK H PYLORI;  Surgeon: Greer Pickerel, MD;  Location: [facility];  Service: General;  Laterality: N/A;  . CELIAC PLEXUS BLOCK  07/18/2018   Per patient it was done at [facility]    . CESAREAN SECTION    . CHOLECYSTECTOMY    . DILITATION & CURRETTAGE/HYSTROSCOPY WITH NOVASURE ABLATION N/A 07/19/2012   Procedure: HYSTEROSCOPY WITH NOVASURE ABLATION;  Surgeon: Farrel Gobble. Harrington Challenger, MD;  Location: [facility];  Service: Gynecology;  Laterality: N/A;  . ENDOMETRIAL ABLATION    .  ESOPHAGOGASTRODUODENOSCOPY N/A 08/16/2015   Procedure: UPPER ESOPHAGOGASTRODUODENOSCOPY (EGD);  Surgeon: Greer Pickerel, MD;  Location: [facility];  Service: General;  Laterality: N/A;  . ESOPHAGOGASTRODUODENOSCOPY N/A 02/21/2016   Procedure: ESOPHAGOGASTRODUODENOSCOPY (EGD);  Surgeon: Greer Pickerel, MD;  Location: [facility];  Service: General;  Laterality: N/A;  . ESOPHAGOGASTRODUODENOSCOPY N/A 02/24/2017   Procedure: ESOPHAGOGASTRODUODENOSCOPY (EGD);  Surgeon: Greer Pickerel, MD;  [facility];  Service: General;  Laterality: N/A;  . IR GENERIC HISTORICAL  10/04/2015   IR [place] GASTRO/COLONIC TUBE Eber Jones Vivianne Master 10/04/2015 Markus Daft, MD [facility]  . IR GENERIC HISTORICAL  01/31/2016   IR [place] GASTRO/COLONIC TUBE PERCUT W/FLUORO 01/31/2016 Corrie Mckusick, DO [facility]  . IR GENERIC HISTORICAL  04/06/2016   IR [place] TUBE PERCUT W/FLUORO 04/06/2016 Arne Cleveland, MD [facility]  . IR GENERIC HISTORICAL  05/14/2016   IR GASTRIC TUBE PERC CHG W/O IMG GUIDE 05/14/2016 Sandi Mariscal, MD [facility]  . LAPAROSCOPIC GASTROSTOMY N/A 09/30/2015   Procedure: LAPAROSCOPIC GASTROSTOMY TUBE PLACEMENT;  Surgeon: Greer Pickerel, MD;  Location[facility];  Service: General;  Laterality: N/A;  . LAPAROSCOPIC ROUX-EN-Y GASTRIC BYPASS WITH HIATAL HERNIA REPAIR N/A 07/08/2015   Procedure: LAPAROSCOPIC ROUX-EN-Y GASTRIC BYPASS WITH HIATAL HERNIA REPAIR WITH UPPER ENDOSCOPY;  Surgeon: Greer Pickerel, MD;  Location[facility];  Service: General;  Laterality: N/A;  . LAPAROSCOPIC SMALL BOWEL RESECTION N/A 09/30/2015   Procedure: LAPAROSCOPIC REVISION OF ROUX LIMB;  Surgeon: Greer Pickerel, MD;  Location[facility];  Service: General;  Laterality: N/A;  . LAPAROSCOPY N/A 09/30/2015   Procedure: LAPAROSCOPY DIAGNOSTIC;  Surgeon: Greer Pickerel, MD;  Location: [facility];  Service: General;  Laterality: N/A;  . REDUCTION MAMMAPLASTY Bilateral 2017  . TUBAL LIGATION    . WISDOM TOOTH EXTRACTION      Social History    Socioeconomic History  . Marital status: Divorced    Spouse name: Not on file  . Number of children: 2  . Years of education: 81  . Highest education level: Not on file  Occupational History  . 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 Smoking status: Never Smoker  . Smokeless tobacco: Never Used  Vaping Use  . Vaping Use: Never used  Substance and Sexual Activity  . Alcohol use: Not Currently  . Drug use: No  . Sexual activity: Not on file  Other Topics Concern  . Not on file  Social History Narrative   Lives with partner   Caffeine use: minimal coffee   Right-handed   Social Determinants of Health   Financial Resource Strain:   . Difficulty of Paying Living Expenses: Not on file  Food Insecurity:   . Worried About Charity fundraiser in the Last Year: Not on file  . Ran Out of Food in the Last Year: Not on file  Transportation Needs:   . Lack of Transportation (Medical): Not on file  . Lack of Transportation (Non-Medical): Not on file  Physical Activity:   . Days of Exercise per Week: Not on file  . Minutes of Exercise per Session: Not on file  Stress:   . Feeling of Stress : Not on file  Social Connections:   . Frequency of Communication with Friends and Family: Not on file  . Frequency of Social Gatherings with Friends and Family: Not on file  . Attends Religious Services: Not on file  . Active Member of Clubs or Organizations: Not on file  . Attends Archivist Meetings: Not on file  . Marital Status: Not on file  Intimate Partner Violence:   . Fear of Current or Ex-Partner: Not on file  . Emotionally Abused: Not on file  . Physically Abused: Not on file  . Sexually Abused: Not on file    Family History  Problem Relation Age of Onset  . Cancer Mother  late 40'-50  . Breast cancer Mother        late 45'-50  . Stroke Father   . Colon polyps Father   . Diabetes Brother   . Seizures Son   . Breast cancer Maternal Aunt         late 40'-50  . Breast cancer Maternal Aunt        late 40'-50  . Breast cancer Maternal Aunt        late 40'-50  . Migraines Neg Hx      Review of Systems  Constitutional: Negative.  Negative for chills and fever.  HENT: Negative.  Negative for congestion and sore throat.   Respiratory: Negative.  Negative for cough and shortness of breath.   Cardiovascular: Negative.  Negative for chest pain and palpitations.  Gastrointestinal: Negative.  Negative for abdominal pain, diarrhea, nausea and vomiting.  Genitourinary: Negative.  Negative for dysuria and hematuria.  Skin: Negative.  Negative for rash.  Neurological: Negative.  Negative for dizziness and headaches.  All other systems reviewed and are negative.  Today's Vitals   12/06/19 1125  BP: 116/82  Pulse: 90  Temp: 98.3 F (36.8 C)  TempSrc: Temporal  SpO2: 100%  Weight: 179 lb 9.6 oz (81.5 kg)  Height: 5\' 8"  (1.727 m)   Body mass index is 27.31 kg/m.   Physical Exam Vitals reviewed.  Constitutional:      Appearance: She is well-developed.  HENT:     Head: Normocephalic.  Eyes:     Extraocular Movements: Extraocular movements intact.     Pupils: Pupils are equal, round, and reactive to light.  Cardiovascular:     Rate and Rhythm: Normal rate and regular rhythm.  Pulmonary:     Breath sounds: Normal breath sounds.  Musculoskeletal:     Cervical back: Normal range of motion.  Skin:    General: Skin is warm and dry.     Capillary Refill: Capillary refill takes less than 2 seconds.     Comments: Tender, hard, and fixed small lump to left proximal inferior clavicle.  Neurological:     General: No focal deficit present.     Mental Status: She is alert and oriented to person, place, and time.  Psychiatric:        Mood and Affect: Mood normal.        Behavior: Behavior normal.      ASSESSMENT & PLAN: Ivan was seen today for mass on left breast and abdominal pain.  Diagnoses and all orders for this  visit:  Mass of soft tissue of chest -     [name] CHEST SOFT TISSUE; Future  Adenopathy -     DG Chest 2 View    Patient Instructions       If you have lab work done today you will be contacted with your lab results within the next 2 weeks.  If you have not heard from [name] then please contact us. The fastest way to get your results is to register for My Chart.   IF you received an x-ray today, you will receive an invoice from [facility]. Please contact [facility] at [phone] with questions or concerns regarding your invoice.   IF you received labwork today, you will receive an invoice from [facility]. Please contact [facility] at [phone] with questions or concerns regarding your invoice.   Our billing staff will not be able to assist you with questions regarding bills from these companies.  You will be contacted with  the lab results as soon as they are available. The fastest way to get your results is to activate your My Chart account. Instructions are located on the last page of this paperwork. If you have not heard from [name] regarding the results in 2 weeks, please contact this office.      Health Maintenance, Female Adopting a healthy lifestyle and getting preventive care are important in promoting health and wellness. Ask your health care provider about:  The right schedule for you to have regular tests and exams.  Things you can do on your own to prevent diseases and keep yourself healthy. What should I know about diet, weight, and exercise? Eat a healthy diet   Eat a diet that includes plenty of vegetables, fruits, low-fat dairy products, and lean protein.  Do not eat a lot of foods that are high in solid fats, added sugars, or sodium. Maintain a healthy weight Body mass index (BMI) is used to identify weight problems. It estimates body fat based on height and weight. Your health care provider can help determine your BMI and help you achieve or  maintain a healthy weight. Get regular exercise Get regular exercise. This is one of the most important things you can do for your health. Most adults should:  Exercise for at least 150 minutes each week. The exercise should increase your heart rate and make you sweat (moderate-intensity exercise).  Do strengthening exercises at least twice a week. This is in addition to the moderate-intensity exercise.  Spend less time sitting. Even light physical activity can be beneficial. Watch cholesterol and blood lipids Have your blood tested for lipids and cholesterol at 50 years of age, then have this test every 5 years. Have your cholesterol levels checked more often if:  Your lipid or cholesterol levels are high.  You are older than 50 years of age.  You are at high risk for heart disease. What should I know about cancer screening? Depending on your health history and family history, you may need to have cancer screening at various ages. This may include screening for:  Breast cancer.  Cervical cancer.  Colorectal cancer.  Skin cancer.  Lung cancer. What should I know about heart disease, diabetes, and high blood pressure? Blood pressure and heart disease  High blood pressure causes heart disease and increases the risk of stroke. This is more likely to develop in people who have high blood pressure readings, are of African descent, or are overweight.  Have your blood pressure checked: ? Every 3-5 years if you are 45-21 years of age. ? Every year if you are 51 years old or older. Diabetes Have regular diabetes screenings. This checks your fasting blood sugar level. Have the screening done:  Once every three years after age 3 if you are at a normal weight and have a low risk for diabetes.  More often and at a younger age if you are overweight or have a high risk for diabetes. What should I know about preventing infection? Hepatitis B If you have a higher risk for hepatitis B,  you should be screened for this virus. Talk with your health care provider to find out if you are at risk for hepatitis B infection. Hepatitis C Testing is recommended for:  Everyone born from [birth year] through 1966.  Anyone with known risk factors for hepatitis C. Sexually transmitted infections (STIs)  Get screened for STIs, including gonorrhea and chlamydia, if: ? You are sexually active and are younger than 50  years of age. ? You are older than 50 years of age and your health care provider tells you that you are at risk for this type of infection. ? Your sexual activity has changed since you were last screened, and you are at increased risk for chlamydia or gonorrhea. Ask your health care provider if you are at risk.  Ask your health care provider about whether you are at high risk for HIV. Your health care provider may recommend a prescription medicine to help prevent HIV infection. If you choose to take medicine to prevent HIV, you should first get tested for HIV. You should then be tested every 3 months for as long as you are taking the medicine. Pregnancy  If you are about to stop having your period (premenopausal) and you may become pregnant, seek counseling before you get pregnant.  Take 400 to 800 micrograms (mcg) of folic acid every day if you become pregnant.  Ask for birth control (contraception) if you want to prevent pregnancy. Osteoporosis and menopause Osteoporosis is a disease in which the bones lose minerals and strength with aging. This can result in bone fractures. If you are 33 years old or older, or if you are at risk for osteoporosis and fractures, ask your health care provider if you should:  Be screened for bone loss.  Take a calcium or vitamin D supplement to lower your risk of fractures.  Be given hormone replacement therapy (HRT) to treat symptoms of menopause. Follow these instructions at home: Lifestyle  Do not use any products that contain nicotine or  tobacco, such as cigarettes, e-cigarettes, and chewing tobacco. If you need help quitting, ask your health care provider.  Do not use street drugs.  Do not share needles.  Ask your health care provider for help if you need support or information about quitting drugs. Alcohol use  Do not drink alcohol if: ? Your health care provider tells you not to drink. ? You are pregnant, may be pregnant, or are planning to become pregnant.  If you drink alcohol: ? Limit how much you use to 0-1 drink a day. ? Limit intake if you are breastfeeding.  Be aware of how much alcohol is in your drink. In the [place] one drink equals one 12 oz bottle of beer (355 mL), one 5 oz glass of wine (148 mL), or one 1 oz glass of hard liquor (44 mL). General instructions  Schedule regular health, dental, and eye exams.  Stay current with your vaccines.  Tell your health care provider if: ? You often feel depressed. ? You have ever been abused or do not feel safe at home. Summary  Adopting a healthy lifestyle and getting preventive care are important in promoting health and wellness.  Follow your health care provider's instructions about healthy diet, exercising, and getting tested or screened for diseases.  Follow your health care provider's instructions on monitoring your cholesterol and blood pressure. This information is not intended to replace advice given to you by your health care provider. Make sure you discuss any questions you have with your health care provider. Document Revised: 02/16/2018 Document Reviewed: 02/16/2018 Elsevier Patient Education  2020 Elsevier Inc.      Agustina Caroli, MD [facility]

## 2019-12-06 NOTE — ED Provider Notes (Signed)
Margaret Fletcher    CSN: [id number] Arrival date & time: 12/05/19  1728      History   Chief Complaint Chief Complaint  Patient presents with  . Mass    HPI Margaret Fletcher is a 49 y.o. female history of idiopathic intracranial hypertension, migraines, presenting today for evaluation of a lump on her chest.  Patient reports that she noticed an area of swelling soreness and discomfort to her superior chest near her clavicle.  She has felt as if this area is swollen.  Reports this feels similar to "oil cysts" which she has not noted to have with prior mammogram and ultrasound imaging of her breasts.  She denies any injury or trauma.  Denies fevers chills or body aches.  HPI  Past Medical History:  Diagnosis Date  . Adie's pupil, left   . Alopecia   . Anxiety   . Autoimmune disease ([place])    "Alopecia"  . Bacterial vaginosis   . Cyst of spleen   . Depression   . Disorder of oral soft tissue 03/17/2019  . Frequent UTI   . Hypertension   . Hypertrophy of breast   . IIH (idiopathic intracranial hypertension) 2017   "related to headaches"  . Injury of jawline 03/17/2019  . Migraine headache    denies, ruled out - Propranolol.Using for headaches  . Non-suppurative otitis media 03/17/2019    Patient Active Problem List   Diagnosis Date Noted  . Urinary frequency 06/20/2019  . Urinary urgency 06/20/2019  . Weakness of pelvic floor 06/20/2019  . Constipation due to outlet dysfunction   . Essential hypertension, benign 05/30/2019  . 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 History of fall 04/10/2019  . Spondylosis, cervical, with myelopathy 03/17/2019  . 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 Low back pain 03/17/2019  . Non-suppurative otitis media 03/17/2019  . Injury of jawline 03/17/2019  . Disorder of oral soft tissue 03/17/2019  . Chronic constipation 07/22/2018  . Reactive depression 06/13/2017  . Chronic pain syndrome  06/13/2017  . Polypharmacy 06/13/2017  . Empty sella syndrome ([place]) 05/01/2017  . Adjustment disorder with mixed anxiety and depressed mood 10/13/2016  . 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 Hypokalemia 02/22/2016  . Constipation, chronic 02/22/2016  . Memory loss due to medical condition 12/14/2015  . Chronic fatigue 12/14/2015  . Recurrent dehydration 09/30/2015  . Chronic nausea 08/23/2015  . Abdominal pain, chronic, epigastric 08/20/2015  . Protein-calorie malnutrition, moderate ([place]) 08/19/2015  . Mild obstructive sleep apnea 07/08/2015  . Dyslipidemia 07/08/2015  . S/P gastric bypass 07/08/2015  . IIH (idiopathic intracranial hypertension) 06/03/2015  . Vision changes 04/30/2015  . Perceived hearing changes 04/30/2015  . Chronic daily headache 04/21/2014  . Liver lesion 03/21/2013  . Splenic cyst 03/21/2013    Past Surgical History:  Procedure Laterality Date  . ANAL RECTAL MANOMETRY N/A 06/02/2019   Procedure: ANO RECTAL MANOMETRY;  Surgeon: Gatha Mayer, MD;  Location: [facility];  Service: Endoscopy;  Laterality: N/A;  . APPENDECTOMY    . BREAST EXCISIONAL BIOPSY Right 2017   removed at time of reduction  . BREAST REDUCTION SURGERY Bilateral 03/26/2015   Procedure: BILATERAL BREAST REDUCTION  ;  Surgeon: Youlanda Roys, MD;  Location: [facility];  Service: Plastics;  Laterality: Bilateral;  . BREATH TEK H PYLORI N/A 05/21/2014   Procedure: BREATH TEK H PYLORI;  Surgeon: Greer Pickerel, MD;  Location: [facility];  Service: General;  Laterality: N/A;  . CELIAC PLEXUS BLOCK  07/18/2018   Per patient it was done at [place]    . CESAREAN SECTION    . CHOLECYSTECTOMY    . DILITATION & CURRETTAGE/HYSTROSCOPY WITH NOVASURE ABLATION N/A 07/19/2012   Procedure: HYSTEROSCOPY WITH NOVASURE ABLATION;  Surgeon: Farrel Gobble. Harrington Challenger, MD;  Location: [facility];  Service: Gynecology;  Laterality: N/A;  . ENDOMETRIAL ABLATION    .  ESOPHAGOGASTRODUODENOSCOPY N/A 08/16/2015   Procedure: UPPER ESOPHAGOGASTRODUODENOSCOPY (EGD);  Surgeon: Greer Pickerel, MD;  Location: [facility];  Service: General;  Laterality: N/A;  . ESOPHAGOGASTRODUODENOSCOPY N/A 02/21/2016   Procedure: ESOPHAGOGASTRODUODENOSCOPY (EGD);  Surgeon: Greer Pickerel, MD;  Location: [facility];  Service: General;  Laterality: N/A;  . ESOPHAGOGASTRODUODENOSCOPY N/A 02/24/2017   Procedure: ESOPHAGOGASTRODUODENOSCOPY (EGD);  Surgeon: Greer Pickerel, MD;  Location: [facility];  Service: General;  Laterality: N/A;  . IR GENERIC HISTORICAL  10/04/2015   IR [place] Vivianne Master 10/04/2015 Markus Daft, MD [facility]  . IR GENERIC HISTORICAL  01/31/2016   IR [place] GASTRO/COLONIC TUBE PERCUT W/FLUORO 01/31/2016 Corrie Mckusick, DO [facility]  . IR GENERIC HISTORICAL  04/06/2016   IR [place] TUBE PERCUT W/FLUORO 04/06/2016 Arne Cleveland, MD [facility]  . IR GENERIC HISTORICAL  05/14/2016   IR GASTRIC TUBE PERC CHG W/O IMG GUIDE 05/14/2016 Sandi Mariscal, MD [facility]  . LAPAROSCOPIC GASTROSTOMY N/A 09/30/2015   Procedure: LAPAROSCOPIC GASTROSTOMY TUBE PLACEMENT;  Surgeon: Greer Pickerel, MD;  Location: [facility];  Service: General;  Laterality: N/A;  . LAPAROSCOPIC ROUX-EN-Y GASTRIC BYPASS WITH HIATAL HERNIA REPAIR N/A 07/08/2015   Procedure: LAPAROSCOPIC ROUX-EN-Y GASTRIC BYPASS WITH HIATAL HERNIA REPAIR WITH UPPER ENDOSCOPY;  Surgeon: Greer Pickerel, MD;  Location: [facility];  Service: General;  Laterality: N/A;  . LAPAROSCOPIC SMALL BOWEL RESECTION N/A 09/30/2015   Procedure: LAPAROSCOPIC REVISION OF ROUX LIMB;  Surgeon: Greer Pickerel, MD;  Location: [facility];  Service: General;  Laterality: N/A;  . LAPAROSCOPY N/A 09/30/2015   Procedure: LAPAROSCOPY DIAGNOSTIC;  Surgeon: Greer Pickerel, MD;  Location: [facility];  Service: General;  Laterality: N/A;  . REDUCTION MAMMAPLASTY Bilateral 2017  . TUBAL LIGATION    . WISDOM TOOTH EXTRACTION      OB History   No  obstetric history on file.      Home Medications    Prior to Admission medications   Medication Sig Start Date End Date Taking? Authorizing Provider  albuterol (PROVENTIL HFA;VENTOLIN HFA) 108 (90 Base) MCG/ACT inhaler Inhale 2 puffs into the lungs every 6 (six) hours as needed for wheezing or shortness of breath. 01/25/18   Shawnee Knapp, MD  amitriptyline (ELAVIL) 10 MG tablet TAKE 2 TABLETS BY MOUTH AT BEDTIME. 07/25/19   Rutherford Guys, MD  azelastine (ASTELIN) 0.1 % nasal spray Place 1 spray into both nostrils 2 (two) times daily. Use in each nostril as directed 04/18/19   Maximiano Coss, NP  BAYER MICROLET LANCETS lancets Use as instructed 06/13/17   Shawnee Knapp, MD  BELBUCA 600 MCG FILM Take 1 strip by mouth 2 (two) times daily. 04/17/19   [provider]  buPROPion (WELLBUTRIN SR) 150 MG 12 hr tablet TAKE 1 TABLET BY MOUTH DAILY 10/11/19   Merian Capron, MD  busPIRone (BUSPAR) 10 MG tablet Take 1 tablet (10 mg total) by mouth 2 (two) times daily. 10/27/19   Merian Capron, MD  CALCIUM-VITAMIN D PO Take 1 tablet by mouth 2 (two) times daily. chewable  [provider]  chlorhexidine (PERIDEX) 0.12 % solution Use as directed 15 mLs in the mouth or throat 2 (two) times daily. 03/17/19   Wendall Mola, NP  ciprofloxacin-hydrocortisone (CIPRO HC OTIC) OTIC suspension Place 3 drops into the left ear 2 (two) times daily. 09/13/19   Maximiano Coss, NP  Cyanocobalamin (VITAMIN B-12 SL) Place 1 drop under the tongue daily. 1 dropperful    [provider]  cyclobenzaprine (FLEXERIL) 5 MG tablet TAKE 1 TABLET BY MOUTH 3 TIMES DAILY AS NEEDED FOR MUSCLE SPASMS. MAY TAKE 2 TABS BY MOUTH AT BEDTIME 06/17/18   Rutherford Guys, MD  docusate sodium (COLACE) 100 MG capsule Take 100 mg by mouth daily as needed.     [provider]  eletriptan (RELPAX) 40 MG tablet Take 1 tablet (40 mg total) by mouth every 2 (two) hours as needed for migraine or headache. Do not use >2  doses/24 hours 08/29/19   Lomax, Amy, NP  famotidine (PEPCID) 20 MG tablet Take 1 tablet (20 mg total) by mouth 2 (two) times daily. 10/30/19   Rutherford Guys, MD  fluticasone (FLONASE) 50 MCG/ACT nasal spray Place 1-2 sprays into both nostrils daily for 7 days. 12/05/19 12/12/19  Talyn Dessert C, PA-C  Galcanezumab-gnlm (EMGALITY) 120 MG/ML SOAJ Inject 240 mg into the skin every 30 (thirty) days. 08/29/19   Lomax, Amy, NP  glucose blood (CONTOUR NEXT TEST) test strip 1 each by Other route 4 (four) times daily as needed (hypoglycemia). K91.2 06/10/17   Shawnee Knapp, MD  hydrocortisone (ANUSOL-HC) 25 MG suppository Place 1 suppository (25 mg total) rectally at bedtime. Use for 5 nights 03/23/19   Noralyn Pick, NP  Multiple Vitamins-Minerals (BARIATRIC MULTIVITAMINS/IRON PO) Take 1 tablet by mouth 3 (three) times daily.    [provider]  naloxegol oxalate (MOVANTIK) 25 MG TABS tablet Take 1 tablet (25 mg total) by mouth daily. 04/19/19   Gatha Mayer, MD  naproxen (NAPROSYN) 500 MG tablet Take 1 tablet (500 mg total) by mouth 2 (two) times daily. 12/05/19   Reve Crocket C, PA-C  naratriptan (AMERGE) 2.5 MG tablet Take 1 tablet (2.5 mg total) by mouth as needed for migraine. Take one (1) tablet at onset of headache; if returns or does not resolve, may repeat after 4 hours; do not exceed five (5) mg in 24 hours. 09/25/19   Lomax, Amy, NP  Omega-3 Fatty Acids (OMEGA-3 FISH OIL PO) Take by mouth daily.    [provider]  ondansetron (ZOFRAN ODT) 4 MG disintegrating tablet Take 1 tablet (4 mg total) by mouth every 8 (eight) hours as needed for nausea or vomiting. 06/13/19   Gatha Mayer, MD  ondansetron (ZOFRAN ODT) 8 MG disintegrating tablet 8mg  ODT q8 hours prn nausea 06/21/19   Palumbo, April, MD  OVER THE COUNTER MEDICATION Meca root    [provider]  OVER THE COUNTER MEDICATION Calcium, magnesium, zinc with D3 (one tablet daily)    [provider]  OVER  THE COUNTER MEDICATION Collagen and vitamin c and biotin (takes one daily)    [provider]  [place] for muscle and joint health    [provider]  polyethylene glycol (MIRALAX / GLYCOLAX) packet Take 17 g by mouth daily as needed (for constipation.).     [provider]  potassium chloride (KLOR-CON) 10 MEQ tablet Take 1 tablet (10 mEq total) by mouth 3 (three) times daily. 06/30/19  Rutherford Guys, MD  pramipexole (MIRAPEX) 0.125 MG tablet Take 1-2 tablets (0.125-0.25 mg total) by mouth at bedtime. Take 2-3 hours before bedtime. 10/30/19   Rutherford Guys, MD  PREMARIN vaginal cream Place 1 Applicatorful vaginally 2 (two) times a week.  11/28/18   [provider]  Prucalopride Succinate (MOTEGRITY) 2 MG TABS Take 1 tablet (2 mg total) by mouth daily. 05/23/19   Gatha Mayer, MD  simethicone (MYLICON) 80 MG chewable tablet Chew 80 mg by mouth every 6 (six) hours as needed for flatulence.    [provider]  topiramate (TOPAMAX) 100 MG tablet Take 1.5 tablets (150 mg total) by mouth daily. 08/29/19   Lomax, Amy, NP  triamterene-hydrochlorothiazide (MAXZIDE-25) 37.5-25 MG tablet Take 1 tablet by mouth daily. 05/30/19   Rutherford Guys, MD  desvenlafaxine (PRISTIQ) 100 MG 24 hr tablet TAKE 1 TABLET BY MOUTH ONCE A DAY Patient taking differently: Take 100 mg by mouth daily.  03/20/19 07/31/19  Rutherford Guys, MD    Family History Family History  Problem Relation Age of Onset  . Cancer Mother        late 25'-50  . Breast cancer Mother        late 25'-50  . Stroke Father   . Colon polyps Father   . Diabetes Brother   . Seizures Son   . Breast cancer Maternal Aunt        late 40'-50  . Breast cancer Maternal Aunt        late 40'-50  . Breast cancer Maternal Aunt        late 40'-50  . Migraines Neg Hx     Social History Social History   Tobacco Use  . Smoking status: Never Smoker  . Smokeless tobacco: Never  Used  Vaping Use  . Vaping Use: Never used  Substance Use Topics  . Alcohol use: Not Currently  . Drug use: No     Allergies   Sulfa antibiotics, Metoclopramide, and Zosyn [piperacillin sod-tazobactam so]   Review of Systems Review of Systems  Constitutional: Negative for fatigue and fever.  HENT: Negative for mouth sores.   Eyes: Negative for visual disturbance.  Respiratory: Negative for shortness of breath.   Cardiovascular: Negative for chest pain.  Gastrointestinal: Negative for abdominal pain, nausea and vomiting.  Genitourinary: Negative for genital sores.  Musculoskeletal: Positive for joint swelling. Negative for arthralgias.  Skin: Positive for color change. Negative for rash and wound.  Neurological: Negative for dizziness, weakness, light-headedness and headaches.     Physical Exam Triage Vital Signs ED Triage Vitals  Enc Vitals Group     BP 12/05/19 1931 129/86     Pulse Rate 12/05/19 1931 83     Resp 12/05/19 1931 19     Temp 12/05/19 1931 97.8 F (36.6 C)     Temp src --      SpO2 12/05/19 1931 100 %     Weight --      Height --      Head Circumference --      Peak Flow --      Pain Score 12/05/19 2043 0     Pain Loc --      Pain Edu? --      Excl. in [place]? --    No data found.  Updated Vital Signs BP 129/86   Pulse 83   Temp 97.8 F (36.6 C)   Resp 19   SpO2 100%  Visual Acuity Right Eye Distance:   Left Eye Distance:   Bilateral Distance:    Right Eye Near:   Left Eye Near:    Bilateral Near:     Physical Exam Vitals and nursing note reviewed.  Constitutional:      Appearance: She is well-developed.     Comments: No acute distress  HENT:     Head: Normocephalic and atraumatic.     Nose: Nose normal.  Eyes:     Conjunctiva/sclera: Conjunctivae normal.  Cardiovascular:     Rate and Rhythm: Normal rate.  Pulmonary:     Effort: Pulmonary effort is normal. No respiratory distress.     Comments: Breathing comfortably at rest,  CTABL, no wheezing, rales or other adventitious sounds auscultated  Abdominal:     General: There is no distension.  Musculoskeletal:        General: Normal range of motion.     Cervical back: Neck supple.     Comments: Area of swelling with associated tenderness noted over proximal left clavicle/left sternal border superiorly, firm nodule noted within skin which appears more superficial and is mobile, does not feel attached to bone No fluctuance, no overlying erythema or warmth  Skin:    General: Skin is warm and dry.  Neurological:     Mental Status: She is alert and oriented to person, place, and time.      UC Treatments / Results  Labs (all labs ordered are listed, but only abnormal results are displayed) Labs Reviewed - No data to display  EKG   Radiology DG Chest 2 View  Result Date: 12/06/2019 CLINICAL DATA:  Left supraclavicular prominence EXAM: CHEST - 2 VIEW COMPARISON:  06/30/2019 FINDINGS: Cardiac shadow is within normal limits. The lungs are well aerated bilaterally. No focal infiltrate or sizable effusion is seen. No bony is noted. No focal abnormality in the supraclavicular region is seen. IMPRESSION: No active cardiopulmonary disease. Electronically Signed   By: Inez Catalina M.D.   On: 12/06/2019 12:10    Procedures Procedures (including critical care time)  Medications Ordered in UC Medications - No data to display  Initial Impression / Assessment and Plan / UC Course  I have reviewed the triage vital signs and the nursing notes.  Pertinent labs & imaging results that were available during my care of the patient were reviewed by me and considered in my medical decision making (see chart for details).     Area on chest does feel most likely cystlike, seems more superficial than prone, likely would benefit from further imaging such as ultrasound.  Do not feel x-ray warranted at this time.  Has previously had imaging performed by breast center, will place new  referral.  Has had prior mammograms, last in 2021 April.  In the interim recommending anti-inflammatories and warm compresses  Discussed strict return precautions. Patient verbalized understanding and is agreeable with plan.  Final Clinical Impressions(s) / UC Diagnoses   Final diagnoses:  Chest swelling  Eustachian tube dysfunction, bilateral     Discharge Instructions     Breast center referral faxed Warm compresses Naprosyn or tylenol/ibuprofen for pain Follow up if not improving or worsening   ED Prescriptions    Medication Sig Dispense Auth. Provider   naproxen (NAPROSYN) 500 MG tablet Take 1 tablet (500 mg total) by mouth 2 (two) times daily. 30 tablet Nasiah Lehenbauer C, PA-C   fluticasone (FLONASE) 50 MCG/ACT nasal spray Place 1-2 sprays into both nostrils daily for 7 days. 1  g Caisley Baxendale, [place] C, PA-C     PDMP not reviewed this encounter.   Janith Lima, [place] 12/06/19 1656

## 2019-12-06 NOTE — Patient Instructions (Addendum)
   If you have lab work done today you will be contacted with your lab results within the next 2 weeks.  If you have not heard from us then please contact us. The fastest way to get your results is to register for My Chart.   IF you received an x-ray today, you will receive an invoice from [facility]. Please contact [facility] at [phone] with questions or concerns regarding your invoice.   IF you received labwork today, you will receive an invoice from [facility]. Please contact [facility] at [phone] with questions or concerns regarding your invoice.   Our billing staff will not be able to assist you with questions regarding bills from these companies.  You will be contacted with the lab results as soon as they are available. The fastest way to get your results is to activate your My Chart account. Instructions are located on the last page of this paperwork. If you have not heard from us regarding the results in 2 weeks, please contact this office.       Health Maintenance, Female Adopting a healthy lifestyle and getting preventive care are important in promoting health and wellness. Ask your health care provider about:  The right schedule for you to have regular tests and exams.  Things you can do on your own to prevent diseases and keep yourself healthy. What should I know about diet, weight, and exercise? Eat a healthy diet   Eat a diet that includes plenty of vegetables, fruits, low-fat dairy products, and lean protein.  Do not eat a lot of foods that are high in solid fats, added sugars, or sodium. Maintain a healthy weight Body mass index (BMI) is used to identify weight problems. It estimates body fat based on height and weight. Your health care provider can help determine your BMI and help you achieve or maintain a healthy weight. Get regular exercise Get regular exercise. This is one of the most important things you can do for your health. Most  adults should:  Exercise for at least 150 minutes each week. The exercise should increase your heart rate and make you sweat (moderate-intensity exercise).  Do strengthening exercises at least twice a week. This is in addition to the moderate-intensity exercise.  Spend less time sitting. Even light physical activity can be beneficial. Watch cholesterol and blood lipids Have your blood tested for lipids and cholesterol at 50 years of age, then have this test every 5 years. Have your cholesterol levels checked more often if:  Your lipid or cholesterol levels are high.  You are older than 50 years of age.  You are at high risk for heart disease. What should I know about cancer screening? Depending on your health history and family history, you may need to have cancer screening at various ages. This may include screening for:  Breast cancer.  Cervical cancer.  Colorectal cancer.  Skin cancer.  Lung cancer. What should I know about heart disease, diabetes, and high blood pressure? Blood pressure and heart disease  High blood pressure causes heart disease and increases the risk of stroke. This is more likely to develop in people who have high blood pressure readings, are of African descent, or are overweight.  Have your blood pressure checked: ? Every 3-5 years if you are 18-39 years of age. ? Every year if you are 40 years old or older. Diabetes Have regular diabetes screenings. This checks your fasting blood sugar level. Have the screening done:  Once   every three years after age 40 if you are at a normal weight and have a low risk for diabetes.  More often and at a younger age if you are overweight or have a high risk for diabetes. What should I know about preventing infection? Hepatitis B If you have a higher risk for hepatitis B, you should be screened for this virus. Talk with your health care provider to find out if you are at risk for hepatitis B infection. Hepatitis  C Testing is recommended for:  Everyone born from 1945 through 1965.  Anyone with known risk factors for hepatitis C. Sexually transmitted infections (STIs)  Get screened for STIs, including gonorrhea and chlamydia, if: ? You are sexually active and are younger than 50 years of age. ? You are older than 50 years of age and your health care provider tells you that you are at risk for this type of infection. ? Your sexual activity has changed since you were last screened, and you are at increased risk for chlamydia or gonorrhea. Ask your health care provider if you are at risk.  Ask your health care provider about whether you are at high risk for HIV. Your health care provider may recommend a prescription medicine to help prevent HIV infection. If you choose to take medicine to prevent HIV, you should first get tested for HIV. You should then be tested every 3 months for as long as you are taking the medicine. Pregnancy  If you are about to stop having your period (premenopausal) and you may become pregnant, seek counseling before you get pregnant.  Take 400 to 800 micrograms (mcg) of folic acid every day if you become pregnant.  Ask for birth control (contraception) if you want to prevent pregnancy. Osteoporosis and menopause Osteoporosis is a disease in which the bones lose minerals and strength with aging. This can result in bone fractures. If you are 65 years old or older, or if you are at risk for osteoporosis and fractures, ask your health care provider if you should:  Be screened for bone loss.  Take a calcium or vitamin D supplement to lower your risk of fractures.  Be given hormone replacement therapy (HRT) to treat symptoms of menopause. Follow these instructions at home: Lifestyle  Do not use any products that contain nicotine or tobacco, such as cigarettes, e-cigarettes, and chewing tobacco. If you need help quitting, ask your health care provider.  Do not use street  drugs.  Do not share needles.  Ask your health care provider for help if you need support or information about quitting drugs. Alcohol use  Do not drink alcohol if: ? Your health care provider tells you not to drink. ? You are pregnant, may be pregnant, or are planning to become pregnant.  If you drink alcohol: ? Limit how much you use to 0-1 drink a day. ? Limit intake if you are breastfeeding.  Be aware of how much alcohol is in your drink. In the [place] one drink equals one 12 oz bottle of beer (355 mL), one 5 oz glass of wine (148 mL), or one 1 oz glass of hard liquor (44 mL). General instructions  Schedule regular health, dental, and eye exams.  Stay current with your vaccines.  Tell your health care provider if: ? You often feel depressed. ? You have ever been abused or do not feel safe at home. Summary  Adopting a healthy lifestyle and getting preventive care are important in promoting health and   wellness.  Follow your health care provider's instructions about healthy diet, exercising, and getting tested or screened for diseases.  Follow your health care provider's instructions on monitoring your cholesterol and blood pressure. This information is not intended to replace advice given to you by your health care provider. Make sure you discuss any questions you have with your health care provider. Document Revised: 02/16/2018 Document Reviewed: 02/16/2018 Elsevier Patient Education  2020 [employer]  

## 2019-12-07 ENCOUNTER — Encounter: Payer: Medicare HMO | Admitting: Physical Therapy

## 2019-12-07 ENCOUNTER — Other Ambulatory Visit: Payer: Medicare HMO

## 2019-12-07 ENCOUNTER — Other Ambulatory Visit: Payer: Self-pay | Admitting: Emergency Medicine

## 2019-12-07 ENCOUNTER — Ambulatory Visit (HOSPITAL_COMMUNITY)
Admission: RE | Admit: 2019-12-07 | Discharge: 2019-12-07 | Disposition: A | Discharge status: Home / Self Care | Payer: Medicare HMO | Source: Ambulatory Visit | Attending: Emergency Medicine | Admitting: Emergency Medicine

## 2019-12-07 DIAGNOSIS — M7989 Other specified soft tissue disorders: Secondary | ICD-10-CM | POA: Diagnosis not present

## 2019-12-07 DIAGNOSIS — R590 Localized enlarged lymph nodes: Secondary | ICD-10-CM

## 2019-12-07 DIAGNOSIS — R221 Localized swelling, mass and lump, neck: Secondary | ICD-10-CM | POA: Diagnosis not present

## 2019-12-07 DIAGNOSIS — R2232 Localized swelling, mass and lump, left upper limb: Secondary | ICD-10-CM | POA: Diagnosis not present

## 2019-12-13 ENCOUNTER — Other Ambulatory Visit (HOSPITAL_COMMUNITY): Payer: Self-pay | Admitting: Psychiatry

## 2019-12-13 ENCOUNTER — Other Ambulatory Visit: Payer: Self-pay

## 2019-12-13 ENCOUNTER — Ambulatory Visit: Payer: Medicare HMO | Admitting: Internal Medicine

## 2019-12-13 ENCOUNTER — Telehealth (INDEPENDENT_AMBULATORY_CARE_PROVIDER_SITE_OTHER): Payer: Medicare HMO | Admitting: Emergency Medicine

## 2019-12-13 ENCOUNTER — Encounter: Payer: Self-pay | Admitting: Emergency Medicine

## 2019-12-13 ENCOUNTER — Encounter: Payer: Self-pay | Admitting: Internal Medicine

## 2019-12-13 VITALS — BP 106/70 | HR 86 | Ht 68.0 in | Wt 179.0 lb

## 2019-12-13 VITALS — BP 106/70 | Ht 68.0 in | Wt 179.0 lb

## 2019-12-13 DIAGNOSIS — R1013 Epigastric pain: Secondary | ICD-10-CM

## 2019-12-13 DIAGNOSIS — K5909 Other constipation: Secondary | ICD-10-CM

## 2019-12-13 DIAGNOSIS — G8929 Other chronic pain: Secondary | ICD-10-CM

## 2019-12-13 DIAGNOSIS — Z9884 Bariatric surgery status: Secondary | ICD-10-CM

## 2019-12-13 DIAGNOSIS — M6289 Other specified disorders of muscle: Secondary | ICD-10-CM | POA: Diagnosis not present

## 2019-12-13 DIAGNOSIS — J029 Acute pharyngitis, unspecified: Secondary | ICD-10-CM | POA: Diagnosis not present

## 2019-12-13 MED ORDER — AZITHROMYCIN 250 MG PO TABS: ORAL_TABLET | ORAL | 0 refills | Status: DC

## 2019-12-13 MED FILL — AZITHROMYCIN 250 MG TABLET: 250 | 5 days supply | Qty: 6 | Fill #0

## 2019-12-13 MED FILL — BUPROPION HCL SR 150 MG TAB: 150 | 30 days supply | Qty: 30 | Fill #0

## 2019-12-13 NOTE — Patient Instructions (Signed)
° ° ° °  If you have lab work done today you will be contacted with your lab results within the next 2 weeks.  If you have not heard from us then please contact us. The fastest way to get your results is to register for My Chart. ° ° °IF you received an x-ray today, you will receive an invoice from [facility]. Please contact [facility] at [phone] with questions or concerns regarding your invoice.  ° °IF you received labwork today, you will receive an invoice from [facility]. Please contact [facility] at [phone] with questions or concerns regarding your invoice.  ° °Our billing staff will not be able to assist you with questions regarding bills from these companies. ° °You will be contacted with the lab results as soon as they are available. The fastest way to get your results is to activate your My Chart account. Instructions are located on the last page of this paperwork. If you have not heard from us regarding the results in 2 weeks, please contact this office. °  ° ° ° °

## 2019-12-13 NOTE — Progress Notes (Signed)
Telemedicine Encounter- SOAP NOTE Established Patient Patient: Home  Provider: Office     This telephone encounter was conducted with the patient's (or proxy's) verbal consent via audio telecommunications: yes/no: Yes Patient was instructed to have this encounter in a suitably private space; and to only have persons present to whom they give permission to participate. In addition, patient identity was confirmed by use of name plus two identifiers (DOB and address).  I discussed the limitations, risks, security and privacy concerns of performing an evaluation and management service by telephone and the availability of in person appointments. I also discussed with the patient that there may be a patient responsible charge related to this service. The patient expressed understanding and agreed to proceed.  I spent a total of TIME; 0 MIN TO 60 MIN: 20 minutes talking with the patient or their proxy.  Chief Complaint  Patient presents with  . Breast Mass    Left side - follow up  . Sore Throat    with runny nose per patient started 2 days ago    Subjective   Margaret Fletcher is a 49 y.o. female established patient. Telephone visit today complaining of sore throat that started 2 days ago progressively getting worse.  Painful swallowing.  Not associated with any other significant symptoms. Seen by me last week with a tender lump to left mid clavicular area.  Soft tissue ultrasound showed calcified area.  CT chest recommended. Not scheduled yet.  Still awaiting insurance approval. Scheduled for mammogram and ultrasounds of breasts on 12/19/2019. No other complaints or medical concerns today.  HPI   Patient Active Problem List   Diagnosis Date Noted  . Urinary frequency 06/20/2019  . Urinary urgency 06/20/2019  . Weakness of pelvic floor 06/20/2019  . Constipation due to outlet dysfunction   . Essential hypertension, benign 05/30/2019  . TMJ pain dysfunction syndrome  04/10/2019  . History of fall 04/10/2019  . Spondylosis, cervical, with myelopathy 03/17/2019  . 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 Low back pain 03/17/2019  . Non-suppurative otitis media 03/17/2019  . Injury of jawline 03/17/2019  . Disorder of oral soft tissue 03/17/2019  . Chronic constipation 07/22/2018  . Reactive depression 06/13/2017  . Chronic pain syndrome 06/13/2017  . Polypharmacy 06/13/2017  . Empty sella syndrome ([place]) 05/01/2017  . Adjustment disorder with mixed anxiety and depressed mood 10/13/2016  . 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 Hypokalemia 02/22/2016  . Constipation, chronic 02/22/2016  . Memory loss due to medical condition 12/14/2015  . Chronic fatigue 12/14/2015  . Recurrent dehydration 09/30/2015  . Chronic nausea 08/23/2015  . Abdominal pain, chronic, epigastric 08/20/2015  . Protein-calorie malnutrition, moderate ([place]) 08/19/2015  . Mild obstructive sleep apnea 07/08/2015  . Dyslipidemia 07/08/2015  . S/P gastric bypass 07/08/2015  . IIH (idiopathic intracranial hypertension) 06/03/2015  . Vision changes 04/30/2015  . Perceived hearing changes 04/30/2015  . Chronic daily headache 04/21/2014  . Liver lesion 03/21/2013  . Splenic cyst 03/21/2013    Past Medical History:  Diagnosis Date  . Adie's pupil, left   . Alopecia   . Anxiety   . Autoimmune disease ([place])    "Alopecia"  . Bacterial vaginosis   . Cyst of spleen   . Depression   . Disorder of oral soft tissue 03/17/2019  . Frequent UTI   . Hypertension   . Hypertrophy of breast   . IIH (idiopathic intracranial hypertension) 2017   "related to  headaches"  . Injury of jawline 03/17/2019  . Migraine headache    denies, ruled out - Propranolol.Using for headaches  . Non-suppurative otitis media 03/17/2019    Current Outpatient Medications  Medication Sig Dispense Refill  . albuterol (PROVENTIL HFA;VENTOLIN HFA) 108 (90 Base) MCG/ACT inhaler Inhale 2  puffs into the lungs every 6 (six) hours as needed for wheezing or shortness of breath. 1 Inhaler 0  . amitriptyline (ELAVIL) 10 MG tablet TAKE 2 TABLETS BY MOUTH AT BEDTIME. 60 tablet 2  . azelastine (ASTELIN) 0.1 % nasal spray Place 1 spray into both nostrils 2 (two) times daily. Use in each nostril as directed 30 mL 0  . BAYER MICROLET LANCETS lancets Use as instructed 100 each 12  . buPROPion (WELLBUTRIN SR) 150 MG 12 hr tablet TAKE 1 TABLET BY MOUTH DAILY 30 tablet 1  . busPIRone (BUSPAR) 10 MG tablet Take 1 tablet (10 mg total) by mouth 2 (two) times daily. 60 tablet 0  . CALCIUM-VITAMIN D PO Take 1 tablet by mouth 2 (two) times daily. chewable    . Cyanocobalamin (VITAMIN B-12 SL) Place 1 drop under the tongue daily. 1 dropperful    . cyclobenzaprine (FLEXERIL) 5 MG tablet TAKE 1 TABLET BY MOUTH 3 TIMES DAILY AS NEEDED FOR MUSCLE SPASMS. MAY TAKE 2 TABS BY MOUTH AT BEDTIME 40 tablet 2  . docusate sodium (COLACE) 100 MG capsule Take 100 mg by mouth daily as needed.     . eletriptan (RELPAX) 40 MG tablet Take 1 tablet (40 mg total) by mouth every 2 (two) hours as needed for migraine or headache. Do not use >2 doses/24 hours 10 tablet 5  . famotidine (PEPCID) 20 MG tablet Take 1 tablet (20 mg total) by mouth 2 (two) times daily. 60 tablet 5  . gabapentin (NEURONTIN) 100 MG capsule Take 100 mg by mouth at bedtime.    . hydrocortisone (ANUSOL-HC) 25 MG suppository Place 1 suppository (25 mg total) rectally at bedtime. Use for 5 nights 5 suppository 0  . Multiple Vitamins-Minerals (BARIATRIC MULTIVITAMINS/IRON PO) Take 1 tablet by mouth 3 (three) times daily.    . naproxen (NAPROSYN) 500 MG tablet Take 1 tablet (500 mg total) by mouth 2 (two) times daily. 30 tablet 0  . naratriptan (AMERGE) 2.5 MG tablet Take 1 tablet (2.5 mg total) by mouth as needed for migraine. Take one (1) tablet at onset of headache; if returns or does not resolve, may repeat after 4 hours; do not exceed five (5) mg in 24  hours. 10 tablet 0  . Omega-3 Fatty Acids (OMEGA-3 FISH OIL PO) Take by mouth daily.    . ondansetron (ZOFRAN ODT) 8 MG disintegrating tablet 8mg  ODT q8 hours prn nausea 10 tablet 0  . OVER THE COUNTER MEDICATION Meca root    . OVER THE COUNTER MEDICATION Calcium, magnesium, zinc with D3 (one tablet daily)    . OVER THE COUNTER MEDICATION Collagen and vitamin c and biotin (takes one daily)    . OVER THE COUNTER MEDICATION Tunic for muscle and joint health    . polyethylene glycol (MIRALAX / GLYCOLAX) packet Take 17 g by mouth daily as needed (for constipation.).     Marland Kitchen pramipexole (MIRAPEX) 0.125 MG tablet Take 1-2 tablets (0.125-0.25 mg total) by mouth at bedtime. Take 2-3 hours before bedtime. 60 tablet 4  . PREMARIN vaginal cream Place 1 Applicatorful vaginally 2 (two) times a week.     . simethicone (MYLICON) 80 MG chewable tablet Chew  80 mg by mouth every 6 (six) hours as needed for flatulence.    . topiramate (TOPAMAX) 100 MG tablet Take 1.5 tablets (150 mg total) by mouth daily. 135 tablet 1  . traMADol (ULTRAM) 50 MG tablet Take 100 mg by mouth every 6 (six) hours. 1-2    . triamterene-hydrochlorothiazide (MAXZIDE-25) 37.5-25 MG tablet Take 1 tablet by mouth daily. 90 tablet 1  . BELBUCA 600 MCG FILM Take 1 strip by mouth 2 (two) times daily.  (Patient not taking: Reported on 12/13/2019)    . chlorhexidine (PERIDEX) 0.12 % solution Use as directed 15 mLs in the mouth or throat 2 (two) times daily. (Patient not taking: Reported on 12/13/2019) 120 mL 0  . ciprofloxacin-hydrocortisone (CIPRO HC OTIC) OTIC suspension Place 3 drops into the left ear 2 (two) times daily. (Patient not taking: Reported on 12/13/2019) 10 mL 0  . fluticasone (FLONASE) 50 MCG/ACT nasal spray Place 1-2 sprays into both nostrils daily for 7 days. 1 g 0  . Galcanezumab-gnlm (EMGALITY) 120 MG/ML SOAJ Inject 240 mg into the skin every 30 (thirty) days. (Patient not taking: Reported on 12/13/2019) 2 pen 0  . glucose blood  (CONTOUR NEXT TEST) test strip 1 each by Other route 4 (four) times daily as needed (hypoglycemia). K91.2 270 each 5  . naloxegol oxalate (MOVANTIK) 25 MG TABS tablet Take 1 tablet (25 mg total) by mouth daily. (Patient not taking: Reported on 12/13/2019) 30 tablet 1  . ondansetron (ZOFRAN ODT) 4 MG disintegrating tablet Take 1 tablet (4 mg total) by mouth every 8 (eight) hours as needed for nausea or vomiting. (Patient not taking: Reported on 12/13/2019) 20 tablet 5  . potassium chloride (KLOR-CON) 10 MEQ tablet Take 1 tablet (10 mEq total) by mouth 3 (three) times daily. (Patient not taking: Reported on 12/13/2019) 12 tablet 0  . Prucalopride Succinate (MOTEGRITY) 2 MG TABS Take 1 tablet (2 mg total) by mouth daily. (Patient not taking: Reported on 12/13/2019) 14 tablet 0   No current facility-administered medications for this visit.    Allergies  Allergen Reactions  . Sulfa Antibiotics Anaphylaxis  . Metoclopramide Other (See Comments)    Elevated prolactin >5x ULN induction lactation when taking scheduled for several months  . Zosyn [Piperacillin Sod-Tazobactam So] Rash    Has patient had a PCN reaction causing immediate rash, facial/tongue/throat swelling, SOB or lightheadedness with hypotension: No Has patient had a PCN reaction causing severe rash involving mucus membranes or skin necrosis: No Has patient had a PCN reaction that required hospitalization: Unknown--inpatient when reaction occurred Has patient had a PCN reaction occurring within the last 10 years: Yes If all of the above answers are "NO", then may proceed with Cephalosporin use.     Social History   Socioeconomic History  . Marital status: Married    Spouse name: Not on file  . Number of children: 2  . Years of education: 61  . Highest education level: Not on file  Occupational History  . 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 Smoking status: Never Smoker  . Smokeless tobacco: Never Used  Vaping Use    . Vaping Use: Never used  Substance and Sexual Activity  . Alcohol use: Not Currently  . Drug use: No  . Sexual activity: Not on file  Other Topics Concern  . Not on file  Social History Narrative   Lives with partner   Caffeine use: minimal coffee   Right-handed   Social Determinants of  Health   Financial Resource Strain:   . Difficulty of Paying Living Expenses: Not on file  Food Insecurity:   . Worried About Charity fundraiser in the Last Year: Not on file  . Ran Out of Food in the Last Year: Not on file  Transportation Needs:   . Lack of Transportation (Medical): Not on file  . Lack of Transportation (Non-Medical): Not on file  Physical Activity:   . Days of Exercise per Week: Not on file  . Minutes of Exercise per Session: Not on file  Stress:   . Feeling of Stress : Not on file  Social Connections:   . Frequency of Communication with Friends and Family: Not on file  . Frequency of Social Gatherings with Friends and Family: Not on file  . Attends Religious Services: Not on file  . Active Member of Clubs or Organizations: Not on file  . Attends Archivist Meetings: Not on file  . Marital Status: Not on file  Intimate Partner Violence:   . Fear of Current or Ex-Partner: Not on file  . Emotionally Abused: Not on file  . Physically Abused: Not on file  . Sexually Abused: Not on file    Review of Systems  Constitutional: Negative.  Negative for chills and fever.  HENT: Positive for sore throat. Negative for congestion.   Respiratory: Negative.  Negative for cough and shortness of breath.   Cardiovascular: Negative.  Negative for chest pain and palpitations.  Gastrointestinal: Negative.  Negative for abdominal pain, diarrhea, nausea and vomiting.  Genitourinary: Negative.  Negative for dysuria and hematuria.  Musculoskeletal: Negative.   Skin: Negative.  Negative for rash.  Neurological: Negative for dizziness and headaches.  All other systems reviewed  and are negative.   Objective  Alert and oriented x3 in no apparent respiratory distress. Vitals as reported by the patient: Today's Vitals   12/13/19 1055  BP: 106/70  Weight: 179 lb (81.2 kg)  Height: 5\' 8"  (1.727 m)    There are no diagnoses linked to this encounter. Rashelle was seen today for breast mass and sore throat.  Diagnoses and all orders for this visit:  Sore throat -     azithromycin (ZITHROMAX) 250 MG tablet; Sig as indicated     I discussed the assessment and treatment plan with the patient. The patient was provided an opportunity to ask questions and all were answered. The patient agreed with the plan and demonstrated an understanding of the instructions.   The patient was advised to call back or seek an in-person evaluation if the symptoms worsen or if the condition fails to improve as anticipated.  I provided 20 minutes of non-face-to-face time during this encounter.  Horald Pollen, MD  Primary Care at [facility]

## 2019-12-13 NOTE — Patient Instructions (Signed)
Ask your pain Dr about taking Duloxetine as this may help your pain.  Hopefully you can get back on [place].   I appreciate the opportunity to care for you. Silvano Rusk, MD, [facility]

## 2019-12-13 NOTE — Progress Notes (Signed)
Margaret Fletcher 49 y.o. 10-28-1970 [id number]  Assessment & Plan:   Encounter Diagnoses  Name Primary?  . Chronic epigastric pain Yes  . Chronic constipation   . Pelvic floor dysfunction in female   . Bariatric surgery status     The patient has continued problems as she has had though perhaps there is some improvement after her pelvic floor physical therapy regarding her constipation.  I explained to her that I was sorry to say I did not have any other clear therapeutic options for her.  I do think perhaps duloxetine could help her but given her current situation with a pain clinic and depression and multiple neuromodulator/psychiatric medications on board already as well as pain medications plus that she is about to move permanently to [place], I did not feel like it was appropriate for me to institute any type of change of that right now.  Unfortunately her Belbuca was helping and life was more tolerable, perhaps that could be covered in the future for her.   She will continue enemas to help with defecation and the maneuver she learned in pelvic floor physical therapy.   Ultimately she will need some form of colon cancer screening but I am not sure a prep would adequately clean her out yet, that was a problem before.  She will be transferring care to providers in [place].  I suggested she consider going to the [facility] to see if any that bariatric specialist there could help her with her post bariatric surgery chronic pain syndrome  CC: Margaret Guys, MD Dr. Greer Pickerel  Subjective:   Chief Complaint: Chronic and constant epigastric pain and nausea,  HPI  Margaret Fletcher is a 49 year old African-American woman with chronic abdominal pain and constipation status post laparoscopic Roux-en-Y bypass.  History is that her problems started after that.  She has seen me with EGD and attempted colonoscopy (see below) after meeting me this year.  Her  symptoms have continued to be refractory and though she had had some improvement using Belbuca through her chronic pain clinic, that was taken off formulary so she was changed to tramadol so she feels her pain is worse.  She went to pelvic floor physical therapy with some improvement but still has to use enemas weekly for defecation.  She is moving to [place] as her husband changed jobs and is now working in [place].  She is here asking if I had other therapeutic options for her.  There is a background of chronic depression in therapy as well.   Anorectal manometry March 2021 with paradoxical contraction of the anal sphincter with inadequate intrarectal pressure generation during attempted defecation, abnormal rectal sensation and abnormal balloon expulsion test unable to expel within 3 minutes.  Impression was normal anal sphincter pressure but evidence of dyssynergia defecation and rectal hyposensitivity likely secondary to chronic constipation  She was referred for pelvic floor physical therapy and participated  CT scan abdomen pelvis March 2021 with diffuse colonic dilation with fluid and fecal material consistent with a generalized colonic ileus and uterus consistent with bulky fibroid changes unchanged from prior exam EGD 04/19/2019 status post bariatric surgery/bypass revision -no cause of chronic epigastric pain see Colonoscopy 04/19/2019 incomplete unable to adequately prep Allergies  Allergen Reactions  . Sulfa Antibiotics Anaphylaxis  . Metoclopramide Other (See Comments)    Elevated prolactin >5x ULN induction lactation when taking scheduled for several months  . Zosyn [Piperacillin Sod-Tazobactam So] Rash  Has patient had a PCN reaction causing immediate rash, facial/tongue/throat swelling, SOB or lightheadedness with hypotension: No Has patient had a PCN reaction causing severe rash involving mucus membranes or skin necrosis: No Has patient had a PCN reaction that  required hospitalization: Unknown--inpatient when reaction occurred Has patient had a PCN reaction occurring within the last 10 years: Yes If all of the above answers are "NO", then may proceed with Cephalosporin use.    Current Meds  Medication Sig  . albuterol (PROVENTIL HFA;VENTOLIN HFA) 108 (90 Base) MCG/ACT inhaler Inhale 2 puffs into the lungs every 6 (six) hours as needed for wheezing or shortness of breath.  Marland Kitchen amitriptyline (ELAVIL) 10 MG tablet TAKE 2 TABLETS BY MOUTH AT BEDTIME.  Marland Kitchen azelastine (ASTELIN) 0.1 % nasal spray Place 1 spray into both nostrils 2 (two) times daily. Use in each nostril as directed  . BAYER MICROLET LANCETS lancets Use as instructed  . busPIRone (BUSPAR) 10 MG tablet Take 1 tablet (10 mg total) by mouth 2 (two) times daily.  Marland Kitchen CALCIUM-VITAMIN D PO Take 1 tablet by mouth 2 (two) times daily. chewable  . chlorhexidine (PERIDEX) 0.12 % solution Use as directed 15 mLs in the mouth or throat 2 (two) times daily. (Patient not taking: Reported on 12/13/2019)  . ciprofloxacin-hydrocortisone (CIPRO HC OTIC) OTIC suspension Place 3 drops into the left ear 2 (two) times daily. (Patient not taking: Reported on 12/13/2019)  . Cyanocobalamin (VITAMIN B-12 SL) Place 1 drop under the tongue daily. 1 dropperful  . cyclobenzaprine (FLEXERIL) 5 MG tablet TAKE 1 TABLET BY MOUTH 3 TIMES DAILY AS NEEDED FOR MUSCLE SPASMS. MAY TAKE 2 TABS BY MOUTH AT BEDTIME  . docusate sodium (COLACE) 100 MG capsule Take 100 mg by mouth daily as needed.   . eletriptan (RELPAX) 40 MG tablet Take 1 tablet (40 mg total) by mouth every 2 (two) hours as needed for migraine or headache. Do not use >2 doses/24 hours  . famotidine (PEPCID) 20 MG tablet Take 1 tablet (20 mg total) by mouth 2 (two) times daily.  Marland Kitchen gabapentin (NEURONTIN) 100 MG capsule Take 100 mg by mouth at bedtime.  . Galcanezumab-gnlm (EMGALITY) 120 MG/ML SOAJ Inject 240 mg into the skin every 30 (thirty) days. (Patient not taking: Reported on  12/13/2019)  . glucose blood (CONTOUR NEXT TEST) test strip 1 each by Other route 4 (four) times daily as needed (hypoglycemia). K91.2  . hydrocortisone (ANUSOL-HC) 25 MG suppository Place 1 suppository (25 mg total) rectally at bedtime. Use for 5 nights  . Multiple Vitamins-Minerals (BARIATRIC MULTIVITAMINS/IRON PO) Take 1 tablet by mouth 3 (three) times daily.  . naloxegol oxalate (MOVANTIK) 25 MG TABS tablet Take 1 tablet (25 mg total) by mouth daily. (Patient not taking: Reported on 12/13/2019)  . naproxen (NAPROSYN) 500 MG tablet Take 1 tablet (500 mg total) by mouth 2 (two) times daily.  . naratriptan (AMERGE) 2.5 MG tablet Take 1 tablet (2.5 mg total) by mouth as needed for migraine. Take one (1) tablet at onset of headache; if returns or does not resolve, may repeat after 4 hours; do not exceed five (5) mg in 24 hours.  . Omega-3 Fatty Acids (OMEGA-3 FISH OIL PO) Take by mouth daily.  . ondansetron (ZOFRAN ODT) 4 MG disintegrating tablet Take 1 tablet (4 mg total) by mouth every 8 (eight) hours as needed for nausea or vomiting. (Patient not taking: Reported on 12/13/2019)  . ondansetron (ZOFRAN ODT) 8 MG disintegrating tablet 8mg  ODT q8 hours prn  nausea  . OVER THE COUNTER MEDICATION Meca root  . OVER THE COUNTER MEDICATION Calcium, magnesium, zinc with D3 (one tablet daily)  . OVER THE COUNTER MEDICATION Collagen and vitamin c and biotin (takes one daily)  . OVER THE COUNTER MEDICATION Tunic for muscle and joint health  . polyethylene glycol (MIRALAX / GLYCOLAX) packet Take 17 g by mouth daily as needed (for constipation.).   Marland Kitchen potassium chloride (KLOR-CON) 10 MEQ tablet Take 1 tablet (10 mEq total) by mouth 3 (three) times daily. (Patient not taking: Reported on 12/13/2019)  . pramipexole (MIRAPEX) 0.125 MG tablet Take 1-2 tablets (0.125-0.25 mg total) by mouth at bedtime. Take 2-3 hours before bedtime.  Marland Kitchen PREMARIN vaginal cream Place 1 Applicatorful vaginally 2 (two) times a week.   .  Prucalopride Succinate (MOTEGRITY) 2 MG TABS Take 1 tablet (2 mg total) by mouth daily. (Patient not taking: Reported on 12/13/2019)  . simethicone (MYLICON) 80 MG chewable tablet Chew 80 mg by mouth every 6 (six) hours as needed for flatulence.  . topiramate (TOPAMAX) 100 MG tablet Take 1.5 tablets (150 mg total) by mouth daily.  . traMADol (ULTRAM) 50 MG tablet Take 100 mg by mouth every 6 (six) hours. 1-2  . triamterene-hydrochlorothiazide (MAXZIDE-25) 37.5-25 MG tablet Take 1 tablet by mouth daily.  . [DISCONTINUED] buPROPion (WELLBUTRIN SR) 150 MG 12 hr tablet TAKE 1 TABLET BY MOUTH DAILY   Past Medical History:  Diagnosis Date  . Adie's pupil, left   . Alopecia   . Anxiety   . Autoimmune disease ([place])    "Alopecia"  . Bacterial vaginosis   . Cyst of spleen   . Depression   . Disorder of oral soft tissue 03/17/2019  . Frequent UTI   . Hypertension   . Hypertrophy of breast   . IIH (idiopathic intracranial hypertension) 2017   "related to headaches"  . Injury of jawline 03/17/2019  . Migraine headache    denies, ruled out - Propranolol.Using for headaches  . Non-suppurative otitis media 03/17/2019   Past Surgical History:  Procedure Laterality Date  . ANAL RECTAL MANOMETRY N/A 06/02/2019   Procedure: ANO RECTAL MANOMETRY;  Surgeon: Gatha Mayer, MD;  Location: [facility];  Service: Endoscopy;  Laterality: N/A;  . APPENDECTOMY    . BREAST EXCISIONAL BIOPSY Right 2017   removed at time of reduction  . BREAST REDUCTION SURGERY Bilateral 03/26/2015   Procedure: BILATERAL BREAST REDUCTION  ;  Surgeon: Youlanda Roys, MD;  Location: [facility];  Service: Plastics;  Laterality: Bilateral;  . BREATH TEK H PYLORI N/A 05/21/2014   Procedure: BREATH TEK H PYLORI;  Surgeon: Greer Pickerel, MD;  Location: [facility];  Service: General;  Laterality: N/A;  . CELIAC PLEXUS BLOCK  07/18/2018   Per patient it was done at [facility]    . CESAREAN  SECTION    . CHOLECYSTECTOMY    . DILITATION & CURRETTAGE/HYSTROSCOPY WITH NOVASURE ABLATION N/A 07/19/2012   Procedure: HYSTEROSCOPY WITH NOVASURE ABLATION;  Surgeon: Farrel Gobble. Harrington Challenger, MD;  Location: [facility];  Service: Gynecology;  Laterality: N/A;  . ENDOMETRIAL ABLATION    . ESOPHAGOGASTRODUODENOSCOPY N/A 08/16/2015   Procedure: UPPER ESOPHAGOGASTRODUODENOSCOPY (EGD);  Surgeon: Greer Pickerel, MD;  Location: [facility];  Service: General;  Laterality: N/A;  . ESOPHAGOGASTRODUODENOSCOPY N/A 02/21/2016   Procedure: ESOPHAGOGASTRODUODENOSCOPY (EGD);  Surgeon: Greer Pickerel, MD;  Location: [facility];  Service: General;  Laterality: N/A;  . ESOPHAGOGASTRODUODENOSCOPY N/A 02/24/2017   Procedure:  ESOPHAGOGASTRODUODENOSCOPY (EGD);  Surgeon: Greer Pickerel, MD;  Location: [facility];  Service: General;  Laterality: N/A;  . IR GENERIC HISTORICAL  10/04/2015   IR [place] Vivianne Master 10/04/2015 Markus Daft, MD [facility]  . IR GENERIC HISTORICAL  01/31/2016   IR [place] GASTRO/COLONIC TUBE PERCUT W/FLUORO 01/31/2016 Corrie Mckusick, DO [facility]  . IR GENERIC HISTORICAL  04/06/2016   IR [place] TUBE PERCUT W/FLUORO 04/06/2016 Arne Cleveland, MD [facility]  . IR GENERIC HISTORICAL  05/14/2016   [facility] TUBE PERC CHG W/O IMG GUIDE 05/14/2016 Sandi Mariscal, MD [facility]  . LAPAROSCOPIC GASTROSTOMY N/A 09/30/2015   Procedure: LAPAROSCOPIC GASTROSTOMY TUBE PLACEMENT;  Surgeon: Greer Pickerel, MD;  Location: [facility];  Service: General;  Laterality: N/A;  . LAPAROSCOPIC ROUX-EN-Y GASTRIC BYPASS WITH HIATAL HERNIA REPAIR N/A 07/08/2015   Procedure: LAPAROSCOPIC ROUX-EN-Y GASTRIC BYPASS WITH HIATAL HERNIA REPAIR WITH UPPER ENDOSCOPY;  Surgeon: Greer Pickerel, MD;  Location: [facility];  Service: General;  Laterality: N/A;  . LAPAROSCOPIC SMALL BOWEL RESECTION N/A 09/30/2015   Procedure: LAPAROSCOPIC REVISION OF ROUX LIMB;  Surgeon: Greer Pickerel, MD;  Location: [facility];  Service: General;   Laterality: N/A;  . LAPAROSCOPY N/A 09/30/2015   Procedure: LAPAROSCOPY DIAGNOSTIC;  Surgeon: Greer Pickerel, MD;  Location: [facility];  Service: General;  Laterality: N/A;  . REDUCTION MAMMAPLASTY Bilateral 2017  . TUBAL LIGATION    . WISDOM TOOTH EXTRACTION     Social History   Social History Narrative   Lives with partner   Caffeine use: minimal coffee   Right-handed   family history includes Breast cancer in her maternal aunt, maternal aunt, maternal aunt, and mother; Cancer in her mother; Colon polyps in her father; Diabetes in her brother; Seizures in her son; Stroke in her father.   Review of Systems As per HPI  Objective:   Physical Exam BP 106/70   Pulse 86   Ht 5\' 8"  (1.727 m)   Wt 179 lb (81.2 kg)   BMI 27.22 kg/m   22 minutes total time

## 2019-12-15 ENCOUNTER — Encounter: Payer: Self-pay | Admitting: Internal Medicine

## 2019-12-18 ENCOUNTER — Other Ambulatory Visit (HOSPITAL_COMMUNITY): Payer: Self-pay | Admitting: Psychiatry

## 2019-12-19 ENCOUNTER — Ambulatory Visit: Payer: Medicare HMO

## 2019-12-19 ENCOUNTER — Other Ambulatory Visit: Payer: Self-pay

## 2019-12-26 MED FILL — BUPROPION HCL SR 150 MG TAB: 150 | 30 days supply | Qty: 30 | Fill #0

## 2019-12-26 MED FILL — busPIRone HCL 10 MG TABS: 10 | 30 days supply | Qty: 60 | Fill #0

## 2019-12-26 MED FILL — ONDANSETRON ODT 8 MG TABLET: 8 | 6 days supply | Qty: 20 | Fill #2

## 2020-01-02 ENCOUNTER — Ambulatory Visit (HOSPITAL_COMMUNITY)
Admission: RE | Admit: 2020-01-02 | Discharge: 2020-01-02 | Disposition: A | Discharge status: Home / Self Care | Payer: Medicare HMO | Source: Ambulatory Visit | Attending: Emergency Medicine | Admitting: Emergency Medicine

## 2020-01-02 ENCOUNTER — Other Ambulatory Visit: Payer: Self-pay

## 2020-01-02 ENCOUNTER — Ambulatory Visit: Payer: Medicare HMO | Admitting: Family Medicine

## 2020-01-02 DIAGNOSIS — R599 Enlarged lymph nodes, unspecified: Secondary | ICD-10-CM | POA: Diagnosis not present

## 2020-01-02 DIAGNOSIS — M7989 Other specified soft tissue disorders: Secondary | ICD-10-CM | POA: Insufficient documentation

## 2020-01-02 DIAGNOSIS — R590 Localized enlarged lymph nodes: Secondary | ICD-10-CM | POA: Diagnosis not present

## 2020-01-02 DIAGNOSIS — G8929 Other chronic pain: Secondary | ICD-10-CM | POA: Diagnosis not present

## 2020-01-02 DIAGNOSIS — M7918 Myalgia, other site: Secondary | ICD-10-CM | POA: Diagnosis not present

## 2020-01-02 DIAGNOSIS — R1013 Epigastric pain: Secondary | ICD-10-CM | POA: Diagnosis not present

## 2020-01-02 DIAGNOSIS — Z79899 Other long term (current) drug therapy: Secondary | ICD-10-CM | POA: Diagnosis not present

## 2020-01-02 DIAGNOSIS — R110 Nausea: Secondary | ICD-10-CM | POA: Diagnosis not present

## 2020-01-03 ENCOUNTER — Other Ambulatory Visit (HOSPITAL_COMMUNITY): Payer: Self-pay | Admitting: Nurse Practitioner

## 2020-01-03 MED FILL — METHOCARBAMOL 750 MG TABS: 750 | 45 days supply | Qty: 90 | Fill #0

## 2020-01-03 MED FILL — ONDANSETRON ODT 8 MG TABLET: 8 | 7 days supply | Qty: 20 | Fill #0

## 2020-01-03 MED FILL — traMADol HCL 50 MG TABS: 50 | 30 days supply | Qty: 180 | Fill #0

## 2020-01-03 MED FILL — BUPRENORPHINE 10 MCG/HR PTW: 10 | 28 days supply | Qty: 4 | Fill #0

## 2020-01-04 ENCOUNTER — Other Ambulatory Visit (HOSPITAL_COMMUNITY): Payer: Self-pay | Admitting: Psychiatry

## 2020-01-04 ENCOUNTER — Encounter: Payer: Self-pay | Admitting: Emergency Medicine

## 2020-01-05 NOTE — Telephone Encounter (Signed)
Pt is requesting CT results see they are in the chart please advise interpretation

## 2020-01-06 NOTE — Telephone Encounter (Signed)
Unremarkable CT of the chest.  Basically normal.  Calcification in the area of concern but nothing else.  No malignancy or additional concerns.

## 2020-01-11 ENCOUNTER — Telehealth (INDEPENDENT_AMBULATORY_CARE_PROVIDER_SITE_OTHER): Payer: Medicare HMO | Admitting: Psychiatry

## 2020-01-11 ENCOUNTER — Encounter (HOSPITAL_COMMUNITY): Payer: Self-pay | Admitting: Psychiatry

## 2020-01-11 DIAGNOSIS — F411 Generalized anxiety disorder: Secondary | ICD-10-CM | POA: Diagnosis not present

## 2020-01-11 DIAGNOSIS — F331 Major depressive disorder, recurrent, moderate: Secondary | ICD-10-CM

## 2020-01-11 DIAGNOSIS — R5382 Chronic fatigue, unspecified: Secondary | ICD-10-CM | POA: Diagnosis not present

## 2020-01-11 MED ORDER — BUPROPION HCL ER (SR) 200 MG PO TB12
200.0000 mg | ORAL_TABLET | Freq: Every day | ORAL | 0 refills | Status: DC
Start: 2020-01-11 — End: 2020-02-12

## 2020-01-11 MED FILL — BUPROPION HCL SR 200 MG TAB: 200 | 30 days supply | Qty: 30 | Fill #0
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
## 2020-01-16 ENCOUNTER — Other Ambulatory Visit (HOSPITAL_COMMUNITY): Payer: Self-pay | Admitting: Nurse Practitioner

## 2020-01-16 ENCOUNTER — Telehealth: Payer: Self-pay

## 2020-01-16 MED ORDER — AMBULATORY NON FORMULARY MEDICATION: 0 refills | Status: AC

## 2020-01-16 MED FILL — AMITRIPTYLINE HCL 10 MG TAB: 10 | 30 days supply | Qty: 60 | Fill #0

## 2020-01-16 MED FILL — GABAPENTIN 100 MG CAPSULE: 100 | 30 days supply | Qty: 30 | Fill #0

## 2020-01-16 NOTE — Telephone Encounter (Signed)
I called the GI cocktail in to [facility], given to [facility]. I let Eliane know and I apologized for it taking so long.

## 2020-01-17 MED FILL — GI COCKTAIL (LID,DICYC,MAA): 4 days supply | Qty: 450 | Fill #0

## 2020-01-26 DIAGNOSIS — H52209 Unspecified astigmatism, unspecified eye: Secondary | ICD-10-CM | POA: Diagnosis not present

## 2020-01-26 DIAGNOSIS — H5213 Myopia, bilateral: Secondary | ICD-10-CM | POA: Diagnosis not present

## 2020-02-02 ENCOUNTER — Encounter: Payer: Medicare HMO | Admitting: Family Medicine

## 2020-02-02 ENCOUNTER — Telehealth (HOSPITAL_COMMUNITY): Payer: Medicare HMO | Admitting: Psychiatry

## 2020-02-07 DIAGNOSIS — Z20822 Contact with and (suspected) exposure to covid-19: Secondary | ICD-10-CM | POA: Diagnosis not present

## 2020-02-12 ENCOUNTER — Other Ambulatory Visit (HOSPITAL_COMMUNITY): Payer: Self-pay | Admitting: Psychiatry

## 2020-02-12 ENCOUNTER — Telehealth (HOSPITAL_COMMUNITY): Payer: Medicare HMO | Admitting: Psychiatry

## 2020-02-12 MED FILL — BUPROPION HCL SR 200 MG TAB: 200 | 30 days supply | Qty: 30 | Fill #0

## 2020-02-12 MED FILL — busPIRone HCL 10 MG TABS: 10 | 30 days supply | Qty: 60 | Fill #0

## 2020-02-12 MED FILL — AMITRIPTYLINE HCL 10 MG TAB: 10 | 30 days supply | Qty: 60 | Fill #1

## 2020-02-12 MED FILL — GABAPENTIN 100 MG CAPSULE: 100 | 30 days supply | Qty: 30 | Fill #1

## 2020-02-12 MED FILL — METHOCARBAMOL 750 MG TABS: 750 | 45 days supply | Qty: 90 | Fill #1

## 2020-02-12 MED FILL — PRAMIPEXOLE 0.125 MG TABLET: 0.125 | 30 days supply | Qty: 60 | Fill #1

## 2020-02-13 MED FILL — BUPRENORPHINE 10 MCG/HR PAT: 10 | 28 days supply | Qty: 4 | Fill #0

## 2020-02-14 ENCOUNTER — Other Ambulatory Visit: Payer: Self-pay | Admitting: *Deleted

## 2020-02-14 ENCOUNTER — Other Ambulatory Visit: Payer: Self-pay | Admitting: Family Medicine

## 2020-02-14 DIAGNOSIS — G43709 Chronic migraine without aura, not intractable, without status migrainosus: Secondary | ICD-10-CM

## 2020-02-14 MED ORDER — TOPIRAMATE 100 MG PO TABS: 150.0000 mg | ORAL_TABLET | Freq: Every day | ORAL | 1 refills | Status: DC

## 2020-02-14 MED FILL — TOPIRAMATE 100 MG TABLET: 100 | 90 days supply | Qty: 135 | Fill #0

## 2020-03-12 ENCOUNTER — Encounter: Payer: Self-pay | Admitting: Emergency Medicine

## 2020-03-13 ENCOUNTER — Other Ambulatory Visit (HOSPITAL_COMMUNITY): Payer: Self-pay | Admitting: Psychiatry

## 2020-03-13 MED FILL — PRAMIPEXOLE 0.125 MG TABLET: 0.125 | 30 days supply | Qty: 60 | Fill #2

## 2020-03-13 MED FILL — ONDANSETRON ODT 8 MG TABLET: 8 | 7 days supply | Qty: 20 | Fill #1

## 2020-03-13 MED FILL — busPIRone HCL 10 MG TABS: 10 | 30 days supply | Qty: 60 | Fill #0

## 2020-03-13 MED FILL — AMITRIPTYLINE HCL 10 MG TAB: 10 | 30 days supply | Qty: 60 | Fill #2

## 2020-03-13 MED FILL — BUPROPION HCL SR 200 MG TAB: 200 | 30 days supply | Qty: 30 | Fill #0

## 2020-03-20 MED FILL — traMADol HCL 50 MG TABS: 50 | 30 days supply | Qty: 180 | Fill #1

## 2020-04-03 ENCOUNTER — Other Ambulatory Visit (HOSPITAL_COMMUNITY): Payer: Self-pay | Admitting: Nurse Practitioner

## 2020-04-03 DIAGNOSIS — G8929 Other chronic pain: Secondary | ICD-10-CM | POA: Diagnosis not present

## 2020-04-03 DIAGNOSIS — Z79899 Other long term (current) drug therapy: Secondary | ICD-10-CM | POA: Diagnosis not present

## 2020-04-03 DIAGNOSIS — R1013 Epigastric pain: Secondary | ICD-10-CM | POA: Diagnosis not present

## 2020-04-06 MED FILL — BUPRENORPHINE 10 MCG/HR PAT: 10 | 28 days supply | Qty: 4 | Fill #0

## 2020-04-09 ENCOUNTER — Other Ambulatory Visit (HOSPITAL_COMMUNITY): Payer: Self-pay | Admitting: Psychiatry

## 2020-04-09 MED FILL — GABAPENTIN 100 MG CAPSULE: 100 | 30 days supply | Qty: 30 | Fill #0

## 2020-04-09 MED FILL — busPIRone HCL 10 MG TABS: 10 | 30 days supply | Qty: 60 | Fill #0

## 2020-04-12 DIAGNOSIS — M542 Cervicalgia: Secondary | ICD-10-CM | POA: Diagnosis not present

## 2020-04-12 DIAGNOSIS — G8929 Other chronic pain: Secondary | ICD-10-CM | POA: Diagnosis not present

## 2020-04-12 DIAGNOSIS — Z882 Allergy status to sulfonamides status: Secondary | ICD-10-CM | POA: Diagnosis not present

## 2020-04-12 DIAGNOSIS — Z79891 Long term (current) use of opiate analgesic: Secondary | ICD-10-CM | POA: Diagnosis not present

## 2020-04-15 ENCOUNTER — Encounter: Payer: Self-pay | Admitting: Emergency Medicine

## 2020-04-17 ENCOUNTER — Ambulatory Visit
Admit: 2020-04-17 | Discharge: 2020-04-17 | Payer: PRIVATE HEALTH INSURANCE | Attending: Physical Medicine & Rehabilitation | Primary: Family Medicine

## 2020-04-17 DIAGNOSIS — S161XXA Strain of muscle, fascia and tendon at neck level, initial encounter: Secondary | ICD-10-CM

## 2020-04-17 MED ORDER — GABAPENTIN 300 MG CAP
300 mg | ORAL_CAPSULE | Freq: Every evening | ORAL | 1 refills | Status: AC
Start: 2020-04-17 — End: ?

## 2020-04-17 MED ORDER — METHYLPREDNISOLONE 4 MG TABS IN A DOSE PACK
4 mg | ORAL | 0 refills | Status: DC
Start: 2020-04-17 — End: 2020-05-16

## 2020-04-18 ENCOUNTER — Other Ambulatory Visit: Payer: Self-pay | Admitting: Emergency Medicine

## 2020-04-18 ENCOUNTER — Other Ambulatory Visit: Payer: Self-pay

## 2020-04-18 DIAGNOSIS — I10 Essential (primary) hypertension: Secondary | ICD-10-CM

## 2020-04-18 MED ORDER — TRIAMTERENE-HCTZ 37.5-25 MG PO TABS: 1.0000 | ORAL_TABLET | Freq: Every day | ORAL | 0 refills | Status: DC

## 2020-04-18 MED FILL — TRIAMTERENE-HCTZ 37.5-25 MG: 37.5-25 | 30 days supply | Qty: 30 | Fill #0

## 2020-05-01 DIAGNOSIS — Z79899 Other long term (current) drug therapy: Secondary | ICD-10-CM | POA: Diagnosis not present

## 2020-05-10 ENCOUNTER — Inpatient Hospital Stay: Admit: 2020-05-10 | Payer: PRIVATE HEALTH INSURANCE | Primary: Family Medicine

## 2020-05-10 ENCOUNTER — Inpatient Hospital Stay
Admit: 2020-05-10 | Discharge: 2020-05-10 | Disposition: A | Discharge status: Home / Self Care | Payer: PRIVATE HEALTH INSURANCE | Attending: Emergency Medicine

## 2020-05-10 DIAGNOSIS — E162 Hypoglycemia, unspecified: Secondary | ICD-10-CM

## 2020-05-10 DIAGNOSIS — S161XXA Strain of muscle, fascia and tendon at neck level, initial encounter: Secondary | ICD-10-CM

## 2020-05-10 LAB — CBC WITH AUTOMATED DIFF
ABS. BASOPHILS: 0.1 10*3/uL (ref 0.0–0.1)
ABS. EOSINOPHILS: 0.2 10*3/uL (ref 0.0–0.4)
ABS. IMM. GRANS.: 0 10*3/uL (ref 0.00–0.04)
ABS. LYMPHOCYTES: 1.9 10*3/uL (ref 0.9–3.6)
ABS. MONOCYTES: 0.5 10*3/uL (ref 0.05–1.2)
ABS. NEUTROPHILS: 4 10*3/uL (ref 1.8–8.0)
ABSOLUTE NRBC: 0 10*3/uL (ref 0.00–0.01)
BASOPHILS: 2 % (ref 0–2)
EOSINOPHILS: 3 % (ref 0–5)
HCT: 38.7 % (ref 35.0–45.0)
HGB: 12.4 g/dL (ref 12.0–16.0)
IMMATURE GRANULOCYTES: 0 % (ref 0.0–0.5)
LYMPHOCYTES: 28 % (ref 21–52)
MCH: 30.2 PG (ref 24.0–34.0)
MCHC: 32 g/dL (ref 31.0–37.0)
MCV: 94.2 FL (ref 78.0–100.0)
MONOCYTES: 8 % (ref 3–10)
MPV: 9.7 FL (ref 9.2–11.8)
NEUTROPHILS: 60 % (ref 40–73)
NRBC: 0 PER 100 WBC
PLATELET: 303 10*3/uL (ref 135–420)
RBC: 4.11 M/uL — ABNORMAL LOW (ref 4.20–5.30)
RDW: 12.6 % (ref 11.6–14.5)
WBC: 6.7 10*3/uL (ref 4.6–13.2)

## 2020-05-10 LAB — METABOLIC PANEL, BASIC
Anion gap: 3 mmol/L (ref 3.0–18)
BUN/Creatinine ratio: 15 (ref 12–20)
BUN: 27 MG/DL — ABNORMAL HIGH (ref 7.0–18)
CO2: 29 mmol/L (ref 21–32)
Calcium: 8.3 MG/DL — ABNORMAL LOW (ref 8.5–10.1)
Chloride: 107 mmol/L (ref 100–111)
Creatinine: 1.78 MG/DL — ABNORMAL HIGH (ref 0.6–1.3)
GFR est AA: 37 mL/min/{1.73_m2} — ABNORMAL LOW (ref >60)
GFR est non-AA: 30 mL/min/{1.73_m2} — ABNORMAL LOW (ref >60)
Glucose: 86 mg/dL (ref 74–99)
Potassium: 4.1 mmol/L (ref 3.5–5.5)
Sodium: 139 mmol/L (ref 136–145)

## 2020-05-10 LAB — GLUCOSE, POC
Glucose (POC): 64 mg/dL — ABNORMAL LOW (ref 70–110)
Glucose (POC): 64 mg/dL — ABNORMAL LOW (ref 70–110)
Glucose (POC): 63 mg/dL — ABNORMAL LOW (ref 70–110)
Glucose (POC): 113 mg/dL — ABNORMAL HIGH (ref 70–110)
Glucose (POC): 67 mg/dL — ABNORMAL LOW (ref 70–110)
Glucose (POC): 65 mg/dL — ABNORMAL LOW (ref 70–110)
Glucose (POC): 107 mg/dL (ref 70–110)

## 2020-05-10 LAB — URINALYSIS W/ RFLX MICROSCOPIC
Bilirubin: NEGATIVE
Blood: NEGATIVE
Glucose: NEGATIVE mg/dL
Ketone: NEGATIVE mg/dL
Leukocyte Esterase: NEGATIVE
Nitrites: NEGATIVE
Protein: NEGATIVE mg/dL
Specific gravity: 1.011 (ref 1.005–1.030)
Urobilinogen: 1 EU/dL (ref 0.2–1.0)
pH (UA): 7.5 (ref 5.0–8.0)

## 2020-05-10 LAB — LIPASE: Lipase: 165 U/L (ref 73–393)

## 2020-05-10 LAB — HCG QL SERUM: HCG, Ql.: NEGATIVE

## 2020-05-10 MED ORDER — DEXTROSE 10% IN WATER (D10W) IV
10 % | Freq: Once | INTRAVENOUS | Status: AC
Start: 2020-05-10 — End: 2020-05-10
  Administered 2020-05-10: 22:00:00 via INTRAVENOUS

## 2020-05-10 MED ORDER — DEXTROSE 10% IN WATER (D10W) IV
10 % | Freq: Once | INTRAVENOUS | Status: AC
Start: 2020-05-10 — End: 2020-05-10
  Administered 2020-05-10: 18:00:00 via INTRAVENOUS

## 2020-05-10 MED FILL — DEXTROSE 10% IN WATER (D10W) IV: 10 % | INTRAVENOUS | Qty: 250

## 2020-05-11 LAB — EKG, 12 LEAD, INITIAL
Atrial Rate: 70 {beats}/min
Calculated P Axis: 39 degrees
Calculated R Axis: 24 degrees
Calculated T Axis: 24 degrees
Diagnosis: NORMAL
P-R Interval: 124 ms
Q-T Interval: 400 ms
QRS Duration: 68 ms
QTC Calculation (Bezet): 432 ms
Ventricular Rate: 70 {beats}/min

## 2020-05-15 ENCOUNTER — Inpatient Hospital Stay: Admit: 2020-05-15 | Payer: PRIVATE HEALTH INSURANCE | Primary: Family Medicine

## 2020-05-16 ENCOUNTER — Telehealth
Admit: 2020-05-16 | Discharge: 2020-05-16 | Payer: PRIVATE HEALTH INSURANCE | Attending: Nurse Practitioner | Primary: Family Medicine

## 2020-05-16 DIAGNOSIS — S161XXA Strain of muscle, fascia and tendon at neck level, initial encounter: Secondary | ICD-10-CM

## 2020-05-17 ENCOUNTER — Encounter: Payer: PRIVATE HEALTH INSURANCE | Primary: Family Medicine

## 2020-05-20 ENCOUNTER — Inpatient Hospital Stay: Admit: 2020-05-20 | Payer: PRIVATE HEALTH INSURANCE | Primary: Family Medicine

## 2020-05-22 ENCOUNTER — Ambulatory Visit
Admit: 2020-05-22 | Discharge: 2020-05-22 | Payer: PRIVATE HEALTH INSURANCE | Attending: Physical Medicine & Rehabilitation | Primary: Family Medicine

## 2020-05-22 DIAGNOSIS — S161XXD Strain of muscle, fascia and tendon at neck level, subsequent encounter: Secondary | ICD-10-CM

## 2020-05-22 MED ORDER — LIDOCAINE 5 % (700 MG/PATCH) ADHESIVE PATCH
5 % | CUTANEOUS | 0 refills | Status: DC
Start: 2020-05-22 — End: 2020-08-15

## 2020-05-23 ENCOUNTER — Inpatient Hospital Stay: Admit: 2020-05-23 | Payer: PRIVATE HEALTH INSURANCE | Primary: Family Medicine

## 2020-05-27 ENCOUNTER — Inpatient Hospital Stay: Admit: 2020-05-27 | Payer: PRIVATE HEALTH INSURANCE | Primary: Family Medicine

## 2020-05-29 DIAGNOSIS — R1013 Epigastric pain: Secondary | ICD-10-CM | POA: Diagnosis not present

## 2020-05-29 DIAGNOSIS — Z79899 Other long term (current) drug therapy: Secondary | ICD-10-CM | POA: Diagnosis not present

## 2020-05-29 DIAGNOSIS — M542 Cervicalgia: Secondary | ICD-10-CM | POA: Diagnosis not present

## 2020-05-29 DIAGNOSIS — G8929 Other chronic pain: Secondary | ICD-10-CM | POA: Diagnosis not present

## 2020-05-30 ENCOUNTER — Inpatient Hospital Stay: Admit: 2020-05-30 | Payer: PRIVATE HEALTH INSURANCE | Primary: Family Medicine

## 2020-05-31 ENCOUNTER — Other Ambulatory Visit (HOSPITAL_BASED_OUTPATIENT_CLINIC_OR_DEPARTMENT_OTHER): Payer: Self-pay

## 2020-06-03 ENCOUNTER — Encounter: Payer: PRIVATE HEALTH INSURANCE | Primary: Family Medicine

## 2020-06-06 ENCOUNTER — Inpatient Hospital Stay: Admit: 2020-06-06 | Payer: PRIVATE HEALTH INSURANCE | Primary: Family Medicine

## 2020-06-24 ENCOUNTER — Other Ambulatory Visit (HOSPITAL_COMMUNITY): Payer: Self-pay

## 2020-06-24 DIAGNOSIS — Z79899 Other long term (current) drug therapy: Secondary | ICD-10-CM | POA: Diagnosis not present

## 2020-06-24 DIAGNOSIS — M542 Cervicalgia: Secondary | ICD-10-CM | POA: Diagnosis not present

## 2020-06-24 DIAGNOSIS — G8929 Other chronic pain: Secondary | ICD-10-CM | POA: Diagnosis not present

## 2020-06-24 DIAGNOSIS — M7918 Myalgia, other site: Secondary | ICD-10-CM | POA: Diagnosis not present

## 2020-06-24 DIAGNOSIS — R1013 Epigastric pain: Secondary | ICD-10-CM | POA: Diagnosis not present

## 2020-06-24 MED ORDER — CYCLOBENZAPRINE HCL 5 MG PO TABS
ORAL_TABLET | ORAL | 3 refills | Status: AC
  Filled 2020-06-24 – 2020-07-03 (×2): qty 30, 30d supply, fill #0

## 2020-06-24 MED ORDER — TRAMADOL HCL 50 MG PO TABS
ORAL_TABLET | ORAL | 0 refills | Status: DC
  Filled 2020-06-24 – 2020-07-03 (×2): qty 90, 15d supply, fill #0

## 2020-06-24 MED ORDER — GABAPENTIN 100 MG PO CAPS
ORAL_CAPSULE | ORAL | 3 refills | Status: AC
  Filled 2020-06-24 – 2020-07-03 (×2): qty 30, 30d supply, fill #0

## 2020-06-25 ENCOUNTER — Inpatient Hospital Stay: Payer: PRIVATE HEALTH INSURANCE | Primary: Family Medicine

## 2020-06-27 ENCOUNTER — Encounter: Attending: Physical Medicine & Rehabilitation | Primary: Family Medicine

## 2020-07-03 ENCOUNTER — Other Ambulatory Visit (HOSPITAL_COMMUNITY): Payer: Self-pay

## 2020-07-03 MED FILL — Ondansetron Orally Disintegrating Tab 8 MG: ORAL | 7 days supply | Qty: 20 | Fill #0 | Status: AC

## 2020-07-03 MED FILL — Pramipexole Dihydrochloride Tab 0.125 MG: ORAL | 30 days supply | Qty: 60 | Fill #0 | Status: AC

## 2020-07-05 ENCOUNTER — Encounter

## 2020-07-08 ENCOUNTER — Encounter

## 2020-07-17 DIAGNOSIS — E162 Hypoglycemia, unspecified: Secondary | ICD-10-CM | POA: Diagnosis not present

## 2020-07-17 DIAGNOSIS — E559 Vitamin D deficiency, unspecified: Secondary | ICD-10-CM | POA: Diagnosis not present

## 2020-07-17 DIAGNOSIS — E782 Mixed hyperlipidemia: Secondary | ICD-10-CM | POA: Diagnosis not present

## 2020-07-17 DIAGNOSIS — K219 Gastro-esophageal reflux disease without esophagitis: Secondary | ICD-10-CM | POA: Diagnosis not present

## 2020-07-17 DIAGNOSIS — N189 Chronic kidney disease, unspecified: Secondary | ICD-10-CM | POA: Diagnosis not present

## 2020-08-08 DIAGNOSIS — I10 Essential (primary) hypertension: Secondary | ICD-10-CM | POA: Diagnosis not present

## 2020-08-08 DIAGNOSIS — N183 Chronic kidney disease, stage 3 unspecified: Secondary | ICD-10-CM | POA: Diagnosis not present

## 2020-08-10 DIAGNOSIS — R112 Nausea with vomiting, unspecified: Secondary | ICD-10-CM | POA: Diagnosis not present

## 2020-08-10 DIAGNOSIS — R109 Unspecified abdominal pain: Secondary | ICD-10-CM | POA: Diagnosis not present

## 2020-08-10 DIAGNOSIS — E1122 Type 2 diabetes mellitus with diabetic chronic kidney disease: Secondary | ICD-10-CM | POA: Diagnosis not present

## 2020-08-10 DIAGNOSIS — G8929 Other chronic pain: Secondary | ICD-10-CM | POA: Diagnosis not present

## 2020-08-10 DIAGNOSIS — Z9884 Bariatric surgery status: Secondary | ICD-10-CM | POA: Diagnosis not present

## 2020-08-10 DIAGNOSIS — Z882 Allergy status to sulfonamides status: Secondary | ICD-10-CM | POA: Diagnosis not present

## 2020-08-10 DIAGNOSIS — R748 Abnormal levels of other serum enzymes: Secondary | ICD-10-CM | POA: Diagnosis not present

## 2020-08-10 DIAGNOSIS — N189 Chronic kidney disease, unspecified: Secondary | ICD-10-CM | POA: Diagnosis not present

## 2020-08-10 DIAGNOSIS — R1013 Epigastric pain: Secondary | ICD-10-CM | POA: Diagnosis not present

## 2020-08-11 DIAGNOSIS — G8929 Other chronic pain: Secondary | ICD-10-CM | POA: Diagnosis not present

## 2020-08-11 DIAGNOSIS — N189 Chronic kidney disease, unspecified: Secondary | ICD-10-CM | POA: Diagnosis not present

## 2020-08-11 DIAGNOSIS — R109 Unspecified abdominal pain: Secondary | ICD-10-CM | POA: Diagnosis not present

## 2020-08-12 DIAGNOSIS — E1122 Type 2 diabetes mellitus with diabetic chronic kidney disease: Secondary | ICD-10-CM | POA: Diagnosis not present

## 2020-08-12 DIAGNOSIS — R001 Bradycardia, unspecified: Secondary | ICD-10-CM | POA: Diagnosis not present

## 2020-08-12 DIAGNOSIS — R1013 Epigastric pain: Secondary | ICD-10-CM | POA: Diagnosis not present

## 2020-08-12 DIAGNOSIS — G8929 Other chronic pain: Secondary | ICD-10-CM | POA: Diagnosis not present

## 2020-08-12 DIAGNOSIS — Z9851 Tubal ligation status: Secondary | ICD-10-CM | POA: Diagnosis not present

## 2020-08-12 DIAGNOSIS — R109 Unspecified abdominal pain: Secondary | ICD-10-CM | POA: Diagnosis not present

## 2020-08-12 DIAGNOSIS — Z9884 Bariatric surgery status: Secondary | ICD-10-CM | POA: Diagnosis not present

## 2020-08-12 DIAGNOSIS — Z882 Allergy status to sulfonamides status: Secondary | ICD-10-CM | POA: Diagnosis not present

## 2020-08-12 DIAGNOSIS — N189 Chronic kidney disease, unspecified: Secondary | ICD-10-CM | POA: Diagnosis not present

## 2020-08-12 DIAGNOSIS — Z9049 Acquired absence of other specified parts of digestive tract: Secondary | ICD-10-CM | POA: Diagnosis not present

## 2020-08-14 DIAGNOSIS — R6881 Early satiety: Secondary | ICD-10-CM | POA: Diagnosis not present

## 2020-08-14 DIAGNOSIS — R1013 Epigastric pain: Secondary | ICD-10-CM | POA: Diagnosis not present

## 2020-08-14 DIAGNOSIS — K5909 Other constipation: Secondary | ICD-10-CM | POA: Diagnosis not present

## 2020-08-14 DIAGNOSIS — R1310 Dysphagia, unspecified: Secondary | ICD-10-CM | POA: Diagnosis not present

## 2020-08-14 DIAGNOSIS — R112 Nausea with vomiting, unspecified: Secondary | ICD-10-CM | POA: Diagnosis not present

## 2020-08-15 ENCOUNTER — Ambulatory Visit
Admit: 2020-08-15 | Discharge: 2020-08-15 | Payer: PRIVATE HEALTH INSURANCE | Attending: Physical Medicine & Rehabilitation | Primary: Family Medicine

## 2020-08-15 DIAGNOSIS — M25511 Pain in right shoulder: Secondary | ICD-10-CM

## 2020-08-15 MED ORDER — BETAMETHASONE ACET & SOD PHOS 6 MG/ML SUSP FOR INJECTION
6 mg/mL | Freq: Once | INTRAMUSCULAR | Status: AC
Start: 2020-08-15 — End: 2020-08-15
  Administered 2020-08-15: 20:00:00

## 2020-08-15 MED ORDER — BUPIVACAINE 0.25 % (2.5 MG/ML) IJ SOLN
0.25 % (2.5 mg/mL) | Freq: Once | INTRAMUSCULAR | Status: AC
Start: 2020-08-15 — End: 2020-08-15
  Administered 2020-08-15: 21:00:00

## 2020-08-15 MED ORDER — LIDOCAINE HCL 2 % (20 MG/ML) IJ SOLN
20 mg/mL (2 %) | Freq: Once | INTRAMUSCULAR | Status: AC
Start: 2020-08-15 — End: 2020-08-15
  Administered 2020-08-15: 21:00:00

## 2020-08-22 ENCOUNTER — Other Ambulatory Visit (HOSPITAL_COMMUNITY): Payer: Self-pay

## 2020-08-22 DIAGNOSIS — M7918 Myalgia, other site: Secondary | ICD-10-CM | POA: Diagnosis not present

## 2020-08-22 DIAGNOSIS — R1013 Epigastric pain: Secondary | ICD-10-CM | POA: Diagnosis not present

## 2020-08-22 DIAGNOSIS — G8929 Other chronic pain: Secondary | ICD-10-CM | POA: Diagnosis not present

## 2020-08-22 DIAGNOSIS — M542 Cervicalgia: Secondary | ICD-10-CM | POA: Diagnosis not present

## 2020-08-22 DIAGNOSIS — Z029 Encounter for administrative examinations, unspecified: Secondary | ICD-10-CM | POA: Diagnosis not present

## 2020-08-22 DIAGNOSIS — Z79899 Other long term (current) drug therapy: Secondary | ICD-10-CM | POA: Diagnosis not present

## 2020-08-22 MED ORDER — AMITRIPTYLINE HCL 10 MG PO TABS
ORAL_TABLET | ORAL | 2 refills | Status: DC
  Filled 2020-08-22: qty 60, 30d supply, fill #0
  Filled 2020-10-28: qty 60, 30d supply, fill #1
  Filled 2020-11-18: qty 60, 30d supply, fill #2

## 2020-08-22 MED ORDER — TRAMADOL HCL 50 MG PO TABS
ORAL_TABLET | ORAL | 0 refills | Status: DC
  Filled 2020-08-22: qty 30, 5d supply, fill #0

## 2020-08-22 MED ORDER — CYCLOBENZAPRINE HCL 5 MG PO TABS
ORAL_TABLET | ORAL | 3 refills | Status: AC
  Filled 2020-08-22: qty 30, 30d supply, fill #0
  Filled 2020-11-18: qty 30, 30d supply, fill #1

## 2020-08-22 MED ORDER — GABAPENTIN 100 MG PO CAPS
ORAL_CAPSULE | ORAL | 3 refills | Status: AC
  Filled 2020-08-22: qty 30, 30d supply, fill #0
  Filled 2020-11-18: qty 30, 30d supply, fill #1

## 2020-08-22 MED ORDER — BELBUCA 600 MCG BU FILM
ORAL_FILM | BUCCAL | 2 refills | Status: AC
  Filled 2020-08-22: qty 60, 30d supply, fill #0

## 2020-08-23 ENCOUNTER — Other Ambulatory Visit (HOSPITAL_COMMUNITY): Payer: Self-pay

## 2020-08-30 ENCOUNTER — Inpatient Hospital Stay: Admit: 2020-08-30 | Payer: PRIVATE HEALTH INSURANCE | Primary: Family Medicine

## 2020-08-30 DIAGNOSIS — M25511 Pain in right shoulder: Secondary | ICD-10-CM

## 2020-09-04 DIAGNOSIS — I10 Essential (primary) hypertension: Secondary | ICD-10-CM | POA: Diagnosis not present

## 2020-09-04 DIAGNOSIS — F32A Depression, unspecified: Secondary | ICD-10-CM | POA: Diagnosis not present

## 2020-09-04 DIAGNOSIS — N183 Chronic kidney disease, stage 3 unspecified: Secondary | ICD-10-CM | POA: Diagnosis not present

## 2020-09-04 DIAGNOSIS — G8929 Other chronic pain: Secondary | ICD-10-CM | POA: Diagnosis not present

## 2020-09-04 DIAGNOSIS — R1013 Epigastric pain: Secondary | ICD-10-CM | POA: Diagnosis not present

## 2020-09-04 DIAGNOSIS — R109 Unspecified abdominal pain: Secondary | ICD-10-CM | POA: Diagnosis not present

## 2020-09-04 DIAGNOSIS — F418 Other specified anxiety disorders: Secondary | ICD-10-CM | POA: Diagnosis not present

## 2020-09-04 DIAGNOSIS — F419 Anxiety disorder, unspecified: Secondary | ICD-10-CM | POA: Diagnosis not present

## 2020-09-04 DIAGNOSIS — I129 Hypertensive chronic kidney disease with stage 1 through stage 4 chronic kidney disease, or unspecified chronic kidney disease: Secondary | ICD-10-CM | POA: Diagnosis not present

## 2020-09-04 DIAGNOSIS — Z9049 Acquired absence of other specified parts of digestive tract: Secondary | ICD-10-CM | POA: Diagnosis not present

## 2020-09-04 DIAGNOSIS — R6881 Early satiety: Secondary | ICD-10-CM | POA: Diagnosis not present

## 2020-09-04 DIAGNOSIS — Z9884 Bariatric surgery status: Secondary | ICD-10-CM | POA: Diagnosis not present

## 2020-09-04 DIAGNOSIS — R1310 Dysphagia, unspecified: Secondary | ICD-10-CM | POA: Diagnosis not present

## 2020-09-06 DIAGNOSIS — E782 Mixed hyperlipidemia: Secondary | ICD-10-CM | POA: Diagnosis not present

## 2020-09-06 DIAGNOSIS — E162 Hypoglycemia, unspecified: Secondary | ICD-10-CM | POA: Diagnosis not present

## 2020-09-06 DIAGNOSIS — E559 Vitamin D deficiency, unspecified: Secondary | ICD-10-CM | POA: Diagnosis not present

## 2020-09-11 DIAGNOSIS — G932 Benign intracranial hypertension: Secondary | ICD-10-CM | POA: Diagnosis not present

## 2020-09-11 DIAGNOSIS — H5704 Mydriasis: Secondary | ICD-10-CM | POA: Diagnosis not present

## 2020-09-13 DIAGNOSIS — K5900 Constipation, unspecified: Secondary | ICD-10-CM | POA: Diagnosis not present

## 2020-09-13 DIAGNOSIS — T7840XA Allergy, unspecified, initial encounter: Secondary | ICD-10-CM | POA: Diagnosis not present

## 2020-09-13 DIAGNOSIS — Z79899 Other long term (current) drug therapy: Secondary | ICD-10-CM | POA: Diagnosis not present

## 2020-09-13 DIAGNOSIS — F419 Anxiety disorder, unspecified: Secondary | ICD-10-CM | POA: Diagnosis not present

## 2020-09-13 DIAGNOSIS — Z880 Allergy status to penicillin: Secondary | ICD-10-CM | POA: Diagnosis not present

## 2020-09-13 DIAGNOSIS — Z888 Allergy status to other drugs, medicaments and biological substances status: Secondary | ICD-10-CM | POA: Diagnosis not present

## 2020-09-13 DIAGNOSIS — F32A Depression, unspecified: Secondary | ICD-10-CM | POA: Diagnosis not present

## 2020-09-13 DIAGNOSIS — Z881 Allergy status to other antibiotic agents status: Secondary | ICD-10-CM | POA: Diagnosis not present

## 2020-09-13 DIAGNOSIS — K5909 Other constipation: Secondary | ICD-10-CM | POA: Diagnosis not present

## 2020-09-13 DIAGNOSIS — I10 Essential (primary) hypertension: Secondary | ICD-10-CM | POA: Diagnosis not present

## 2020-09-18 ENCOUNTER — Inpatient Hospital Stay: Admit: 2020-09-18 | Payer: PRIVATE HEALTH INSURANCE | Primary: Family Medicine

## 2020-09-18 DIAGNOSIS — R112 Nausea with vomiting, unspecified: Secondary | ICD-10-CM | POA: Diagnosis not present

## 2020-09-18 DIAGNOSIS — K5909 Other constipation: Secondary | ICD-10-CM | POA: Diagnosis not present

## 2020-09-18 DIAGNOSIS — M25511 Pain in right shoulder: Secondary | ICD-10-CM

## 2020-09-19 ENCOUNTER — Other Ambulatory Visit (HOSPITAL_COMMUNITY): Payer: Self-pay

## 2020-09-20 ENCOUNTER — Other Ambulatory Visit (HOSPITAL_COMMUNITY): Payer: Self-pay

## 2020-09-20 ENCOUNTER — Encounter: Payer: PRIVATE HEALTH INSURANCE | Primary: Family Medicine

## 2020-09-23 ENCOUNTER — Encounter: Payer: PRIVATE HEALTH INSURANCE | Primary: Family Medicine

## 2020-09-23 DIAGNOSIS — K5909 Other constipation: Secondary | ICD-10-CM | POA: Diagnosis not present

## 2020-09-23 DIAGNOSIS — E162 Hypoglycemia, unspecified: Secondary | ICD-10-CM | POA: Diagnosis not present

## 2020-09-23 DIAGNOSIS — Z9049 Acquired absence of other specified parts of digestive tract: Secondary | ICD-10-CM | POA: Diagnosis not present

## 2020-09-25 ENCOUNTER — Encounter: Payer: PRIVATE HEALTH INSURANCE | Primary: Family Medicine

## 2020-09-27 ENCOUNTER — Encounter: Payer: PRIVATE HEALTH INSURANCE | Primary: Family Medicine

## 2020-10-02 ENCOUNTER — Encounter: Payer: PRIVATE HEALTH INSURANCE | Primary: Family Medicine

## 2020-10-04 ENCOUNTER — Encounter: Payer: PRIVATE HEALTH INSURANCE | Primary: Family Medicine

## 2020-10-15 ENCOUNTER — Ambulatory Visit
Admit: 2020-10-15 | Discharge: 2020-10-15 | Payer: PRIVATE HEALTH INSURANCE | Attending: Physical Medicine & Rehabilitation | Primary: Family Medicine

## 2020-10-15 DIAGNOSIS — M5412 Radiculopathy, cervical region: Secondary | ICD-10-CM

## 2020-10-15 MED ORDER — BUPIVACAINE 0.25 % (2.5 MG/ML) IJ SOLN
0.25 % (2.5 mg/mL) | Freq: Once | INTRAMUSCULAR | Status: AC
Start: 2020-10-15 — End: 2020-10-15
  Administered 2020-10-15: 19:00:00

## 2020-10-15 MED ORDER — BETAMETHASONE ACET & SOD PHOS 6 MG/ML SUSP FOR INJECTION
6 mg/mL | Freq: Once | INTRAMUSCULAR | Status: AC
Start: 2020-10-15 — End: 2020-10-15
  Administered 2020-10-15: 19:00:00

## 2020-10-15 MED ORDER — LIDOCAINE HCL 2 % (20 MG/ML) IJ SOLN
20 mg/mL (2 %) | Freq: Once | INTRAMUSCULAR | Status: AC
Start: 2020-10-15 — End: 2020-10-15
  Administered 2020-10-15: 19:00:00

## 2020-10-17 ENCOUNTER — Encounter

## 2020-10-23 ENCOUNTER — Encounter: Attending: Physical Medicine & Rehabilitation | Primary: Family Medicine

## 2020-10-28 ENCOUNTER — Other Ambulatory Visit (HOSPITAL_COMMUNITY): Payer: Self-pay

## 2020-10-28 MED ORDER — TRAMADOL HCL 50 MG PO TABS
ORAL_TABLET | ORAL | 0 refills | Status: DC
  Filled 2020-10-28: qty 90, 15d supply, fill #0

## 2020-10-28 MED FILL — Gabapentin Cap 100 MG: ORAL | 30 days supply | Qty: 30 | Fill #0 | Status: AC

## 2020-10-28 MED FILL — Pramipexole Dihydrochloride Tab 0.125 MG: ORAL | 30 days supply | Qty: 60 | Fill #1 | Status: AC

## 2020-10-29 ENCOUNTER — Encounter

## 2020-11-02 ENCOUNTER — Inpatient Hospital Stay
Admit: 2020-11-02 | Payer: PRIVATE HEALTH INSURANCE | Attending: Physical Medicine & Rehabilitation | Primary: Family Medicine

## 2020-11-02 DIAGNOSIS — M25511 Pain in right shoulder: Secondary | ICD-10-CM

## 2020-11-04 DIAGNOSIS — R112 Nausea with vomiting, unspecified: Secondary | ICD-10-CM | POA: Diagnosis not present

## 2020-11-05 ENCOUNTER — Telehealth

## 2020-11-18 ENCOUNTER — Other Ambulatory Visit (HOSPITAL_COMMUNITY): Payer: Self-pay

## 2020-11-18 ENCOUNTER — Other Ambulatory Visit: Payer: Self-pay

## 2020-11-20 ENCOUNTER — Other Ambulatory Visit (HOSPITAL_COMMUNITY): Payer: Self-pay

## 2020-11-21 ENCOUNTER — Other Ambulatory Visit (HOSPITAL_COMMUNITY): Payer: Self-pay

## 2020-11-21 ENCOUNTER — Ambulatory Visit
Admit: 2020-11-21 | Discharge: 2020-11-21 | Payer: PRIVATE HEALTH INSURANCE | Attending: Physical Medicine & Rehabilitation | Primary: Family Medicine

## 2020-11-22 DIAGNOSIS — M75101 Unspecified rotator cuff tear or rupture of right shoulder, not specified as traumatic: Secondary | ICD-10-CM

## 2020-11-25 ENCOUNTER — Encounter: Attending: Nurse Practitioner | Primary: Family Medicine

## 2020-11-27 ENCOUNTER — Ambulatory Visit
Admit: 2020-11-27 | Discharge: 2020-11-27 | Payer: PRIVATE HEALTH INSURANCE | Attending: Orthopaedic Surgery | Primary: Family Medicine

## 2020-11-27 ENCOUNTER — Encounter

## 2020-11-27 DIAGNOSIS — M25511 Pain in right shoulder: Secondary | ICD-10-CM | POA: Diagnosis not present

## 2020-11-27 DIAGNOSIS — M5412 Radiculopathy, cervical region: Secondary | ICD-10-CM | POA: Diagnosis not present

## 2020-11-27 MED ORDER — DICLOFENAC 1 % TOPICAL GEL
1 % | Freq: Four times a day (QID) | CUTANEOUS | 2 refills | Status: AC
Start: 2020-11-27 — End: ?

## 2020-11-27 MED ORDER — MELOXICAM 15 MG TAB
15 mg | ORAL_TABLET | Freq: Every day | ORAL | 2 refills | Status: AC
Start: 2020-11-27 — End: ?

## 2020-12-11 DIAGNOSIS — E782 Mixed hyperlipidemia: Secondary | ICD-10-CM | POA: Diagnosis not present

## 2020-12-11 DIAGNOSIS — Z23 Encounter for immunization: Secondary | ICD-10-CM | POA: Diagnosis not present

## 2020-12-11 DIAGNOSIS — E559 Vitamin D deficiency, unspecified: Secondary | ICD-10-CM | POA: Diagnosis not present

## 2020-12-11 DIAGNOSIS — R69 Illness, unspecified: Secondary | ICD-10-CM | POA: Diagnosis not present

## 2020-12-11 DIAGNOSIS — E162 Hypoglycemia, unspecified: Secondary | ICD-10-CM | POA: Diagnosis not present

## 2021-01-15 ENCOUNTER — Other Ambulatory Visit (HOSPITAL_COMMUNITY): Payer: Self-pay

## 2021-01-15 DIAGNOSIS — R110 Nausea: Secondary | ICD-10-CM | POA: Diagnosis not present

## 2021-01-15 DIAGNOSIS — G8929 Other chronic pain: Secondary | ICD-10-CM | POA: Diagnosis not present

## 2021-01-15 DIAGNOSIS — Z79899 Other long term (current) drug therapy: Secondary | ICD-10-CM | POA: Diagnosis not present

## 2021-01-15 DIAGNOSIS — R1013 Epigastric pain: Secondary | ICD-10-CM | POA: Diagnosis not present

## 2021-01-15 DIAGNOSIS — M25511 Pain in right shoulder: Secondary | ICD-10-CM | POA: Diagnosis not present

## 2021-01-15 DIAGNOSIS — M7918 Myalgia, other site: Secondary | ICD-10-CM | POA: Diagnosis not present

## 2021-01-15 MED ORDER — TRAMADOL HCL 50 MG PO TABS: ORAL_TABLET | ORAL | 2 refills | Status: AC

## 2021-01-15 MED ORDER — ONDANSETRON 8 MG PO TBDP
ORAL_TABLET | ORAL | 5 refills | Status: AC
  Filled 2021-01-15: qty 20, 6d supply, fill #0
  Filled 2021-12-06: qty 20, 6d supply, fill #1
  Filled 2021-12-31: qty 20, 6d supply, fill #2

## 2021-01-15 MED ORDER — GABAPENTIN 100 MG PO CAPS
ORAL_CAPSULE | ORAL | 3 refills | Status: AC
  Filled 2021-01-15: qty 30, 30d supply, fill #0
  Filled 2021-03-13: qty 30, 30d supply, fill #1
  Filled 2021-05-05: qty 30, 30d supply, fill #2
  Filled 2021-12-06: qty 30, 30d supply, fill #3

## 2021-01-15 MED ORDER — CYCLOBENZAPRINE HCL 5 MG PO TABS
ORAL_TABLET | ORAL | 3 refills | Status: AC
  Filled 2021-01-15: qty 30, 30d supply, fill #0
  Filled 2021-12-06: qty 30, 30d supply, fill #1

## 2021-01-15 MED ORDER — TRAMADOL HCL 50 MG PO TABS
ORAL_TABLET | ORAL | 2 refills | Status: AC
  Filled 2021-01-15: qty 120, 15d supply, fill #0
  Filled 2021-03-13: qty 120, 15d supply, fill #1
  Filled 2021-05-05: qty 120, 15d supply, fill #2

## 2021-01-16 ENCOUNTER — Other Ambulatory Visit (HOSPITAL_COMMUNITY): Payer: Self-pay

## 2021-01-17 ENCOUNTER — Other Ambulatory Visit (HOSPITAL_COMMUNITY): Payer: Self-pay

## 2021-01-18 ENCOUNTER — Other Ambulatory Visit (HOSPITAL_COMMUNITY): Payer: Self-pay

## 2021-01-20 ENCOUNTER — Other Ambulatory Visit (HOSPITAL_COMMUNITY): Payer: Self-pay

## 2021-03-13 ENCOUNTER — Other Ambulatory Visit (HOSPITAL_COMMUNITY): Payer: Self-pay

## 2021-04-02 ENCOUNTER — Other Ambulatory Visit (HOSPITAL_COMMUNITY): Payer: Self-pay

## 2021-04-03 ENCOUNTER — Other Ambulatory Visit (HOSPITAL_COMMUNITY): Payer: Self-pay

## 2021-04-03 ENCOUNTER — Encounter

## 2021-04-04 ENCOUNTER — Other Ambulatory Visit (HOSPITAL_COMMUNITY): Payer: Self-pay

## 2021-04-14 DIAGNOSIS — E782 Mixed hyperlipidemia: Secondary | ICD-10-CM | POA: Diagnosis not present

## 2021-04-14 DIAGNOSIS — E162 Hypoglycemia, unspecified: Secondary | ICD-10-CM | POA: Diagnosis not present

## 2021-04-14 DIAGNOSIS — E559 Vitamin D deficiency, unspecified: Secondary | ICD-10-CM | POA: Diagnosis not present

## 2021-04-14 DIAGNOSIS — G629 Polyneuropathy, unspecified: Secondary | ICD-10-CM | POA: Diagnosis not present

## 2021-04-18 DIAGNOSIS — K5909 Other constipation: Secondary | ICD-10-CM | POA: Diagnosis not present

## 2021-05-01 DIAGNOSIS — E1122 Type 2 diabetes mellitus with diabetic chronic kidney disease: Secondary | ICD-10-CM | POA: Diagnosis not present

## 2021-05-01 DIAGNOSIS — D123 Benign neoplasm of transverse colon: Secondary | ICD-10-CM | POA: Diagnosis not present

## 2021-05-01 DIAGNOSIS — K640 First degree hemorrhoids: Secondary | ICD-10-CM | POA: Diagnosis not present

## 2021-05-01 DIAGNOSIS — E669 Obesity, unspecified: Secondary | ICD-10-CM | POA: Diagnosis not present

## 2021-05-01 DIAGNOSIS — D124 Benign neoplasm of descending colon: Secondary | ICD-10-CM | POA: Diagnosis not present

## 2021-05-01 DIAGNOSIS — N183 Chronic kidney disease, stage 3 unspecified: Secondary | ICD-10-CM | POA: Diagnosis not present

## 2021-05-01 DIAGNOSIS — F419 Anxiety disorder, unspecified: Secondary | ICD-10-CM | POA: Diagnosis not present

## 2021-05-01 DIAGNOSIS — K5909 Other constipation: Secondary | ICD-10-CM | POA: Diagnosis not present

## 2021-05-01 DIAGNOSIS — K635 Polyp of colon: Secondary | ICD-10-CM | POA: Diagnosis not present

## 2021-05-01 DIAGNOSIS — I129 Hypertensive chronic kidney disease with stage 1 through stage 4 chronic kidney disease, or unspecified chronic kidney disease: Secondary | ICD-10-CM | POA: Diagnosis not present

## 2021-05-05 ENCOUNTER — Other Ambulatory Visit (HOSPITAL_COMMUNITY): Payer: Self-pay

## 2021-05-05 DIAGNOSIS — M25511 Pain in right shoulder: Secondary | ICD-10-CM | POA: Diagnosis not present

## 2021-05-05 DIAGNOSIS — Z79891 Long term (current) use of opiate analgesic: Secondary | ICD-10-CM | POA: Diagnosis not present

## 2021-05-05 DIAGNOSIS — Z9884 Bariatric surgery status: Secondary | ICD-10-CM | POA: Diagnosis not present

## 2021-05-05 DIAGNOSIS — M4722 Other spondylosis with radiculopathy, cervical region: Secondary | ICD-10-CM | POA: Diagnosis not present

## 2021-05-05 DIAGNOSIS — E1122 Type 2 diabetes mellitus with diabetic chronic kidney disease: Secondary | ICD-10-CM | POA: Diagnosis not present

## 2021-05-05 DIAGNOSIS — N189 Chronic kidney disease, unspecified: Secondary | ICD-10-CM | POA: Diagnosis not present

## 2021-05-05 MED ORDER — PRAMIPEXOLE DIHYDROCHLORIDE 0.125 MG PO TABS
ORAL_TABLET | ORAL | 4 refills | Status: AC
  Filled 2021-05-05: qty 60, 30d supply, fill #0
  Filled 2021-09-13: qty 60, 30d supply, fill #1
  Filled 2021-12-06: qty 60, 30d supply, fill #2
  Filled 2021-12-31: qty 60, 30d supply, fill #3

## 2021-05-05 MED ORDER — AMITRIPTYLINE HCL 10 MG PO TABS
ORAL_TABLET | ORAL | 2 refills | Status: AC
  Filled 2021-05-05: qty 60, 30d supply, fill #0
  Filled 2021-09-13: qty 60, 30d supply, fill #1
  Filled 2021-12-06: qty 60, 30d supply, fill #2

## 2021-05-06 DIAGNOSIS — M4802 Spinal stenosis, cervical region: Secondary | ICD-10-CM | POA: Diagnosis not present

## 2021-05-06 DIAGNOSIS — M5412 Radiculopathy, cervical region: Secondary | ICD-10-CM | POA: Diagnosis not present

## 2021-05-13 ENCOUNTER — Other Ambulatory Visit (HOSPITAL_COMMUNITY): Payer: Self-pay

## 2021-05-13 MED ORDER — CYCLOBENZAPRINE HCL 5 MG PO TABS
ORAL_TABLET | ORAL | 3 refills | Status: AC
  Filled 2021-05-13: qty 30, 30d supply, fill #0

## 2021-05-13 MED ORDER — TRAMADOL HCL 50 MG PO TABS
ORAL_TABLET | ORAL | 2 refills | Status: AC
  Filled 2021-06-24: qty 240, 30d supply, fill #0
  Filled 2021-09-13: qty 240, 30d supply, fill #1

## 2021-05-13 MED ORDER — GABAPENTIN 100 MG PO CAPS
ORAL_CAPSULE | ORAL | 3 refills | Status: AC
  Filled 2021-05-13: qty 30, 30d supply, fill #0

## 2021-06-24 ENCOUNTER — Other Ambulatory Visit (HOSPITAL_COMMUNITY): Payer: Self-pay

## 2021-06-25 ENCOUNTER — Other Ambulatory Visit (HOSPITAL_COMMUNITY): Payer: Self-pay

## 2021-06-30 ENCOUNTER — Other Ambulatory Visit (HOSPITAL_COMMUNITY): Payer: Self-pay

## 2021-06-30 MED ORDER — GABAPENTIN 100 MG PO CAPS
ORAL_CAPSULE | ORAL | 3 refills | Status: AC
  Filled 2021-06-30 – 2021-09-13 (×2): qty 30, 15d supply, fill #0
  Filled 2021-12-06 – 2021-12-31 (×2): qty 30, 15d supply, fill #1

## 2021-06-30 MED ORDER — CYCLOBENZAPRINE HCL 5 MG PO TABS
ORAL_TABLET | ORAL | 3 refills | Status: DC
  Filled 2021-06-30: qty 30, 30d supply, fill #0

## 2021-06-30 MED ORDER — TRAMADOL HCL 50 MG PO TABS
ORAL_TABLET | ORAL | 2 refills | Status: AC
  Filled 2021-11-09: qty 240, 30d supply, fill #0
  Filled 2021-12-06 – 2021-12-08 (×2): qty 240, 30d supply, fill #1

## 2021-06-30 MED ORDER — GABAPENTIN 100 MG PO CAPS
ORAL_CAPSULE | ORAL | 3 refills | Status: AC
  Filled 2021-06-30: qty 30, 30d supply, fill #0
  Filled 2021-11-09: qty 30, 30d supply, fill #1

## 2021-07-01 ENCOUNTER — Other Ambulatory Visit (HOSPITAL_COMMUNITY): Payer: Self-pay

## 2021-09-13 ENCOUNTER — Other Ambulatory Visit (HOSPITAL_COMMUNITY): Payer: Self-pay

## 2021-11-10 ENCOUNTER — Other Ambulatory Visit (HOSPITAL_COMMUNITY): Payer: Self-pay

## 2021-11-11 ENCOUNTER — Other Ambulatory Visit (HOSPITAL_COMMUNITY): Payer: Self-pay

## 2021-11-13 ENCOUNTER — Other Ambulatory Visit (HOSPITAL_COMMUNITY): Payer: Self-pay

## 2021-12-06 ENCOUNTER — Other Ambulatory Visit (HOSPITAL_COMMUNITY): Payer: Self-pay | Admitting: Psychiatry

## 2021-12-06 ENCOUNTER — Other Ambulatory Visit (HOSPITAL_COMMUNITY): Payer: Self-pay

## 2021-12-08 ENCOUNTER — Other Ambulatory Visit (HOSPITAL_COMMUNITY): Payer: Self-pay

## 2021-12-31 ENCOUNTER — Other Ambulatory Visit (HOSPITAL_COMMUNITY): Payer: Self-pay

## 2022-01-01 ENCOUNTER — Other Ambulatory Visit (HOSPITAL_COMMUNITY): Payer: Self-pay

## 2022-01-02 ENCOUNTER — Other Ambulatory Visit (HOSPITAL_COMMUNITY): Payer: Self-pay

## 2022-01-09 ENCOUNTER — Other Ambulatory Visit (HOSPITAL_COMMUNITY): Payer: Self-pay

## 2022-01-27 ENCOUNTER — Other Ambulatory Visit (HOSPITAL_COMMUNITY): Payer: Self-pay

## 2022-01-30 ENCOUNTER — Other Ambulatory Visit (HOSPITAL_COMMUNITY): Payer: Self-pay
